# Patient Record
Sex: Male | Born: 1965 | Race: White | Hispanic: No | Smoking: Former smoker
Health system: Southern US, Community
[De-identification: ages and names within clinical notes are randomized; demographics above are authoritative.]

## PROBLEM LIST (undated history)

## (undated) DIAGNOSIS — I10 Essential (primary) hypertension: Secondary | ICD-10-CM

## (undated) DIAGNOSIS — G6181 Chronic inflammatory demyelinating polyneuritis: Secondary | ICD-10-CM

## (undated) DIAGNOSIS — E785 Hyperlipidemia, unspecified: Secondary | ICD-10-CM

## (undated) DIAGNOSIS — F909 Attention-deficit hyperactivity disorder, unspecified type: Secondary | ICD-10-CM

## (undated) DIAGNOSIS — K859 Acute pancreatitis without necrosis or infection, unspecified: Secondary | ICD-10-CM

## (undated) DIAGNOSIS — E119 Type 2 diabetes mellitus without complications: Secondary | ICD-10-CM

## (undated) DIAGNOSIS — F329 Major depressive disorder, single episode, unspecified: Secondary | ICD-10-CM

## (undated) DIAGNOSIS — F419 Anxiety disorder, unspecified: Secondary | ICD-10-CM

## (undated) DIAGNOSIS — B3781 Candidal esophagitis: Secondary | ICD-10-CM

## (undated) DIAGNOSIS — K5790 Diverticulosis of intestine, part unspecified, without perforation or abscess without bleeding: Secondary | ICD-10-CM

## (undated) DIAGNOSIS — N200 Calculus of kidney: Secondary | ICD-10-CM

## (undated) DIAGNOSIS — F32A Depression, unspecified: Secondary | ICD-10-CM

## (undated) DIAGNOSIS — K259 Gastric ulcer, unspecified as acute or chronic, without hemorrhage or perforation: Secondary | ICD-10-CM

## (undated) DIAGNOSIS — E559 Vitamin D deficiency, unspecified: Secondary | ICD-10-CM

## (undated) HISTORY — DX: Diverticulosis of intestine, part unspecified, without perforation or abscess without bleeding: K57.90

## (undated) HISTORY — PX: RETINAL DETACHMENT SURGERY: SHX105

## (undated) HISTORY — DX: Depression, unspecified: F32.A

## (undated) HISTORY — DX: Attention-deficit hyperactivity disorder, unspecified type: F90.9

## (undated) HISTORY — PX: CERVICAL DISC SURGERY: SHX588

## (undated) HISTORY — DX: Acute pancreatitis without necrosis or infection, unspecified: K85.90

## (undated) HISTORY — DX: Candidal esophagitis: B37.81

## (undated) HISTORY — DX: Major depressive disorder, single episode, unspecified: F32.9

## (undated) HISTORY — DX: Essential (primary) hypertension: I10

## (undated) HISTORY — PX: VASECTOMY: SHX75

## (undated) HISTORY — DX: Calculus of kidney: N20.0

## (undated) HISTORY — PX: RHINOPLASTY: SUR1284

## (undated) HISTORY — DX: Anxiety disorder, unspecified: F41.9

## (undated) HISTORY — DX: Hyperlipidemia, unspecified: E78.5

## (undated) HISTORY — PX: ELBOW SURGERY: SHX618

## (undated) HISTORY — DX: Gastric ulcer, unspecified as acute or chronic, without hemorrhage or perforation: K25.9

## (undated) HISTORY — DX: Type 2 diabetes mellitus without complications: E11.9

## (undated) HISTORY — PX: ANKLE SURGERY: SHX546

---

## 1999-12-14 ENCOUNTER — Emergency Department (HOSPITAL_COMMUNITY): Admission: EM | Admit: 1999-12-14 | Discharge: 1999-12-14 | Payer: Self-pay | Admitting: Emergency Medicine

## 2000-05-04 ENCOUNTER — Ambulatory Visit (HOSPITAL_COMMUNITY): Admission: RE | Admit: 2000-05-04 | Discharge: 2000-05-04 | Payer: Self-pay | Admitting: Urology

## 2000-05-04 ENCOUNTER — Encounter (INDEPENDENT_AMBULATORY_CARE_PROVIDER_SITE_OTHER): Payer: Self-pay | Admitting: Specialist

## 2005-05-15 ENCOUNTER — Ambulatory Visit (HOSPITAL_COMMUNITY): Admission: RE | Admit: 2005-05-15 | Discharge: 2005-05-15 | Payer: Self-pay | Admitting: Orthopedic Surgery

## 2005-05-15 ENCOUNTER — Ambulatory Visit (HOSPITAL_BASED_OUTPATIENT_CLINIC_OR_DEPARTMENT_OTHER): Admission: RE | Admit: 2005-05-15 | Discharge: 2005-05-15 | Payer: Self-pay | Admitting: Orthopedic Surgery

## 2006-07-06 ENCOUNTER — Encounter: Admission: RE | Admit: 2006-07-06 | Discharge: 2006-07-06 | Payer: Self-pay | Admitting: Orthopedic Surgery

## 2006-08-05 ENCOUNTER — Ambulatory Visit (HOSPITAL_COMMUNITY): Admission: RE | Admit: 2006-08-05 | Discharge: 2006-08-05 | Payer: Self-pay | Admitting: Neurosurgery

## 2006-10-01 ENCOUNTER — Encounter: Admission: RE | Admit: 2006-10-01 | Discharge: 2006-10-01 | Payer: Self-pay | Admitting: Neurosurgery

## 2008-07-08 ENCOUNTER — Emergency Department (HOSPITAL_COMMUNITY): Admission: EM | Admit: 2008-07-08 | Discharge: 2008-07-08 | Payer: Self-pay | Admitting: Family Medicine

## 2010-06-14 ENCOUNTER — Ambulatory Visit (HOSPITAL_COMMUNITY)
Admission: RE | Admit: 2010-06-14 | Discharge: 2010-06-15 | Payer: Self-pay | Source: Home / Self Care | Admitting: Ophthalmology

## 2010-06-17 LAB — BASIC METABOLIC PANEL
BUN: 12 mg/dL (ref 6–23)
CO2: 24 mEq/L (ref 19–32)
Calcium: 9.3 mg/dL (ref 8.4–10.5)
Chloride: 97 mEq/L (ref 96–112)
Creatinine, Ser: 0.75 mg/dL (ref 0.4–1.5)
GFR calc Af Amer: >60 mL/min (ref >60)
GFR calc non Af Amer: >60 mL/min (ref >60)
Glucose, Bld: 317 mg/dL — ABNORMAL HIGH (ref 70–99)
Potassium: 4.6 mEq/L (ref 3.5–5.1)
Sodium: 128 mEq/L — ABNORMAL LOW (ref 135–145)

## 2010-06-17 LAB — CBC
HCT: 40.6 % (ref 39.0–52.0)
Hemoglobin: 14.5 g/dL (ref 13.0–17.0)
MCH: 30.3 pg (ref 26.0–34.0)
MCHC: 35.7 g/dL (ref 30.0–36.0)
MCV: 84.8 fL (ref 78.0–100.0)
Platelets: 184 10*3/uL (ref 150–400)
RBC: 4.79 MIL/uL (ref 4.22–5.81)
RDW: 13.2 % (ref 11.5–15.5)
WBC: 6.5 10*3/uL (ref 4.0–10.5)

## 2010-06-17 LAB — GLUCOSE, CAPILLARY
Glucose-Capillary: 360 mg/dL — ABNORMAL HIGH (ref 70–99)
Glucose-Capillary: 280 mg/dL — ABNORMAL HIGH (ref 70–99)
Glucose-Capillary: 220 mg/dL — ABNORMAL HIGH (ref 70–99)
Glucose-Capillary: 224 mg/dL — ABNORMAL HIGH (ref 70–99)
Glucose-Capillary: 335 mg/dL — ABNORMAL HIGH (ref 70–99)
Glucose-Capillary: 283 mg/dL — ABNORMAL HIGH (ref 70–99)
Glucose-Capillary: 211 mg/dL — ABNORMAL HIGH (ref 70–99)

## 2010-06-17 LAB — SURGICAL PCR SCREEN
MRSA, PCR: NEGATIVE
Staphylococcus aureus: NEGATIVE

## 2010-09-17 LAB — POCT I-STAT, CHEM 8
BUN: 12 mg/dL (ref 6–23)
Calcium, Ion: 1.21 mmol/L (ref 1.12–1.32)
Chloride: 93 mEq/L — ABNORMAL LOW (ref 96–112)
Creatinine, Ser: 0.9 mg/dL (ref 0.4–1.5)
Glucose, Bld: 626 mg/dL (ref 70–99)
HCT: 44 % (ref 39.0–52.0)
Hemoglobin: 15 g/dL (ref 13.0–17.0)
Potassium: 4.6 mEq/L (ref 3.5–5.1)
Sodium: 127 mEq/L — ABNORMAL LOW (ref 135–145)
TCO2: 24 mmol/L (ref 0–100)

## 2010-09-17 LAB — GLUCOSE, CAPILLARY: Glucose-Capillary: >600 mg/dL (ref 70–99)

## 2010-10-15 ENCOUNTER — Emergency Department (HOSPITAL_COMMUNITY): Payer: PRIVATE HEALTH INSURANCE

## 2010-10-15 ENCOUNTER — Inpatient Hospital Stay (HOSPITAL_COMMUNITY)
Admission: EM | Admit: 2010-10-15 | Discharge: 2010-10-23 | DRG: 439 | Disposition: A | Discharge status: Home / Self Care | Payer: PRIVATE HEALTH INSURANCE | Attending: Endocrinology | Admitting: Endocrinology

## 2010-10-15 ENCOUNTER — Inpatient Hospital Stay (INDEPENDENT_AMBULATORY_CARE_PROVIDER_SITE_OTHER)
Admission: RE | Admit: 2010-10-15 | Discharge: 2010-10-15 | Disposition: A | Discharge status: Home / Self Care | Payer: PRIVATE HEALTH INSURANCE | Source: Ambulatory Visit | Attending: Family Medicine | Admitting: Family Medicine

## 2010-10-15 DIAGNOSIS — K859 Acute pancreatitis without necrosis or infection, unspecified: Principal | ICD-10-CM | POA: Diagnosis present

## 2010-10-15 DIAGNOSIS — E109 Type 1 diabetes mellitus without complications: Secondary | ICD-10-CM | POA: Diagnosis present

## 2010-10-15 DIAGNOSIS — R109 Unspecified abdominal pain: Secondary | ICD-10-CM

## 2010-10-15 DIAGNOSIS — J9819 Other pulmonary collapse: Secondary | ICD-10-CM | POA: Diagnosis present

## 2010-10-15 DIAGNOSIS — Z794 Long term (current) use of insulin: Secondary | ICD-10-CM

## 2010-10-15 DIAGNOSIS — E785 Hyperlipidemia, unspecified: Secondary | ICD-10-CM | POA: Diagnosis present

## 2010-10-15 DIAGNOSIS — K5730 Diverticulosis of large intestine without perforation or abscess without bleeding: Secondary | ICD-10-CM | POA: Diagnosis present

## 2010-10-15 LAB — CBC
HCT: 37.2 % — ABNORMAL LOW (ref 39.0–52.0)
Hemoglobin: 13.5 g/dL (ref 13.0–17.0)
MCH: 29.6 pg (ref 26.0–34.0)
MCHC: 36.3 g/dL — ABNORMAL HIGH (ref 30.0–36.0)
MCV: 81.6 fL (ref 78.0–100.0)
Platelets: 192 10*3/uL (ref 150–400)
RBC: 4.56 MIL/uL (ref 4.22–5.81)
RDW: 13.5 % (ref 11.5–15.5)
WBC: 10.3 10*3/uL (ref 4.0–10.5)

## 2010-10-15 LAB — POCT I-STAT, CHEM 8
BUN: 10 mg/dL (ref 6–23)
Calcium, Ion: 1.07 mmol/L — ABNORMAL LOW (ref 1.12–1.32)
Chloride: 102 mEq/L (ref 96–112)
Creatinine, Ser: 0.9 mg/dL (ref 0.4–1.5)
Glucose, Bld: 320 mg/dL — ABNORMAL HIGH (ref 70–99)
HCT: 45 % (ref 39.0–52.0)
Hemoglobin: 15.3 g/dL (ref 13.0–17.0)
Potassium: 4 mEq/L (ref 3.5–5.1)
Sodium: 128 mEq/L — ABNORMAL LOW (ref 135–145)
TCO2: 23 mmol/L (ref 0–100)

## 2010-10-15 LAB — DIFFERENTIAL
Basophils Absolute: 0 10*3/uL (ref 0.0–0.1)
Basophils Relative: 0 % (ref 0–1)
Eosinophils Absolute: 0.2 10*3/uL (ref 0.0–0.7)
Eosinophils Relative: 2 % (ref 0–5)
Lymphocytes Relative: 11 % — ABNORMAL LOW (ref 12–46)
Lymphs Abs: 1.1 10*3/uL (ref 0.7–4.0)
Monocytes Absolute: 0.5 10*3/uL (ref 0.1–1.0)
Monocytes Relative: 5 % (ref 3–12)
Neutro Abs: 8.6 10*3/uL — ABNORMAL HIGH (ref 1.7–7.7)
Neutrophils Relative %: 83 % — ABNORMAL HIGH (ref 43–77)

## 2010-10-15 LAB — COMPREHENSIVE METABOLIC PANEL
ALT: 26 U/L (ref 0–53)
AST: 27 U/L (ref 0–37)
Albumin: 3.7 g/dL (ref 3.5–5.2)
Alkaline Phosphatase: 158 U/L — ABNORMAL HIGH (ref 39–117)
BUN: 10 mg/dL (ref 6–23)
CO2: 21 mEq/L (ref 19–32)
Calcium: 9.6 mg/dL (ref 8.4–10.5)
Chloride: 95 mEq/L — ABNORMAL LOW (ref 96–112)
Creatinine, Ser: 0.78 mg/dL (ref 0.4–1.5)
GFR calc Af Amer: >60 mL/min (ref >60)
GFR calc non Af Amer: >60 mL/min (ref >60)
Glucose, Bld: 311 mg/dL — ABNORMAL HIGH (ref 70–99)
Potassium: 3.9 mEq/L (ref 3.5–5.1)
Sodium: 130 mEq/L — ABNORMAL LOW (ref 135–145)
Total Bilirubin: 0.7 mg/dL (ref 0.3–1.2)
Total Protein: 7.3 g/dL (ref 6.0–8.3)

## 2010-10-15 LAB — GLUCOSE, CAPILLARY
Glucose-Capillary: 311 mg/dL — ABNORMAL HIGH (ref 70–99)
Glucose-Capillary: 280 mg/dL — ABNORMAL HIGH (ref 70–99)

## 2010-10-15 LAB — LIPASE, BLOOD: Lipase: 542 U/L — ABNORMAL HIGH (ref 11–59)

## 2010-10-16 ENCOUNTER — Inpatient Hospital Stay (HOSPITAL_COMMUNITY): Payer: PRIVATE HEALTH INSURANCE

## 2010-10-16 LAB — BASIC METABOLIC PANEL
BUN: 8 mg/dL (ref 6–23)
GFR calc Af Amer: >60 mL/min (ref >60)
GFR calc non Af Amer: >60 mL/min (ref >60)
Potassium: 5.5 mEq/L — ABNORMAL HIGH (ref 3.5–5.1)
Sodium: 127 mEq/L — ABNORMAL LOW (ref 135–145)

## 2010-10-16 LAB — COMPREHENSIVE METABOLIC PANEL
Albumin: 3.2 g/dL — ABNORMAL LOW (ref 3.5–5.2)
Alkaline Phosphatase: 106 U/L (ref 39–117)
Chloride: 99 mEq/L (ref 96–112)
Potassium: 5.8 mEq/L — ABNORMAL HIGH (ref 3.5–5.1)
Sodium: 130 mEq/L — ABNORMAL LOW (ref 135–145)
Total Bilirubin: 1 mg/dL (ref 0.3–1.2)
Total Protein: 6.8 g/dL (ref 6.0–8.3)

## 2010-10-16 LAB — DIFFERENTIAL
Basophils Absolute: 0 10*3/uL (ref 0.0–0.1)
Basophils Relative: 0 % (ref 0–1)
Eosinophils Relative: 2 % (ref 0–5)
Monocytes Absolute: 0.7 10*3/uL (ref 0.1–1.0)
Monocytes Relative: 7 % (ref 3–12)
Neutro Abs: 7 10*3/uL (ref 1.7–7.7)

## 2010-10-16 LAB — GLUCOSE, CAPILLARY: Glucose-Capillary: 197 mg/dL — ABNORMAL HIGH (ref 70–99)

## 2010-10-16 LAB — CBC
HCT: 34.5 % — ABNORMAL LOW (ref 39.0–52.0)
Hemoglobin: 12.1 g/dL — ABNORMAL LOW (ref 13.0–17.0)
MCH: 29.7 pg (ref 26.0–34.0)
MCHC: 35.1 g/dL (ref 30.0–36.0)
RDW: 13.9 % (ref 11.5–15.5)

## 2010-10-16 LAB — TSH: TSH: 0.48 u[IU]/mL (ref 0.350–4.500)

## 2010-10-16 LAB — LIPID PANEL
Cholesterol: 918 mg/dL — ABNORMAL HIGH (ref 0–200)
LDL Cholesterol: UNDETERMINED mg/dL (ref 0–99)
VLDL: UNDETERMINED mg/dL (ref 0–40)

## 2010-10-16 LAB — HEMOGLOBIN A1C
Hgb A1c MFr Bld: 15 % — ABNORMAL HIGH (ref <5.7)
Mean Plasma Glucose: 384 mg/dL — ABNORMAL HIGH (ref <117)

## 2010-10-16 MED ORDER — IOHEXOL 300 MG/ML  SOLN
100.0000 mL | Freq: Once | INTRAMUSCULAR | Status: AC | PRN
  Administered 2010-10-16: 100 mL via INTRAVENOUS

## 2010-10-17 DIAGNOSIS — K859 Acute pancreatitis without necrosis or infection, unspecified: Secondary | ICD-10-CM

## 2010-10-17 LAB — LIPID PANEL
LDL Cholesterol: UNDETERMINED mg/dL (ref 0–99)
Total CHOL/HDL Ratio: 12.8 RATIO
VLDL: UNDETERMINED mg/dL (ref 0–40)

## 2010-10-17 LAB — GLUCOSE, CAPILLARY
Glucose-Capillary: 169 mg/dL — ABNORMAL HIGH (ref 70–99)
Glucose-Capillary: 264 mg/dL — ABNORMAL HIGH (ref 70–99)

## 2010-10-17 LAB — COMPREHENSIVE METABOLIC PANEL
Albumin: 2.7 g/dL — ABNORMAL LOW (ref 3.5–5.2)
Alkaline Phosphatase: 107 U/L (ref 39–117)
Glucose, Bld: 177 mg/dL — ABNORMAL HIGH (ref 70–99)
Potassium: 3.8 mEq/L (ref 3.5–5.1)
Sodium: 128 mEq/L — ABNORMAL LOW (ref 135–145)
Total Protein: 5.9 g/dL — ABNORMAL LOW (ref 6.0–8.3)

## 2010-10-17 LAB — CBC
HCT: 35.5 % — ABNORMAL LOW (ref 39.0–52.0)
RBC: 4.15 MIL/uL — ABNORMAL LOW (ref 4.22–5.81)
RDW: 13.7 % (ref 11.5–15.5)
WBC: 10.7 10*3/uL — ABNORMAL HIGH (ref 4.0–10.5)

## 2010-10-18 ENCOUNTER — Inpatient Hospital Stay (HOSPITAL_COMMUNITY): Payer: PRIVATE HEALTH INSURANCE

## 2010-10-18 LAB — CBC
Hemoglobin: 11.7 g/dL — ABNORMAL LOW (ref 13.0–17.0)
MCH: 29.6 pg (ref 26.0–34.0)
MCHC: 34.3 g/dL (ref 30.0–36.0)
Platelets: 199 10*3/uL (ref 150–400)
RBC: 3.95 MIL/uL — ABNORMAL LOW (ref 4.22–5.81)

## 2010-10-18 LAB — COMPREHENSIVE METABOLIC PANEL
ALT: 14 U/L (ref 0–53)
AST: 15 U/L (ref 0–37)
Alkaline Phosphatase: 104 U/L (ref 39–117)
CO2: 24 mEq/L (ref 19–32)
GFR calc Af Amer: >60 mL/min (ref >60)
GFR calc non Af Amer: >60 mL/min (ref >60)
Glucose, Bld: 150 mg/dL — ABNORMAL HIGH (ref 70–99)
Potassium: 3.9 mEq/L (ref 3.5–5.1)
Sodium: 131 mEq/L — ABNORMAL LOW (ref 135–145)

## 2010-10-18 LAB — GLUCOSE, CAPILLARY
Glucose-Capillary: 162 mg/dL — ABNORMAL HIGH (ref 70–99)
Glucose-Capillary: 171 mg/dL — ABNORMAL HIGH (ref 70–99)
Glucose-Capillary: 181 mg/dL — ABNORMAL HIGH (ref 70–99)

## 2010-10-18 LAB — LIPASE, BLOOD: Lipase: 42 U/L (ref 11–59)

## 2010-10-18 LAB — LIPID PANEL
HDL: 46 mg/dL (ref >39)
LDL Cholesterol: UNDETERMINED mg/dL (ref 0–99)
Total CHOL/HDL Ratio: 13.2 RATIO

## 2010-10-18 NOTE — Op Note (Signed)
[name], [name]             ACCOUNT NO.:  [id number]   MEDICAL RECORD NO.:  [id number]          PATIENT TYPE:  AMB   LOCATION:  SDS                          FACILITY:  [facility]   PHYSICIAN:  Henry A. Pool, M.D.    DATE OF BIRTH:  04-May-1966   DATE OF PROCEDURE:  08/05/2006  DATE OF DISCHARGE:                               OPERATIVE REPORT   PREOPERATIVE DIAGNOSIS:  Right C5-6 herniated nucleus pulposus with  radiculopathy.   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   PROCEDURE NOTE:  C5-6 anterior cervical diskectomy and fusion with  allograft anterior plating.   SURGEON:  Kathaleen Maser. Pool, M.D.   ASSISTANT:  Tia Alert, MD.   ANESTHESIA:  General orotracheal anesthesia.   INDICATIONS FOR PROCEDURE:  Mr. [name] is a 45 year old male with  history of neck and right extremity pain, paresthesias and weakness, a  C6 radiculopathy.  Workup demonstrates evidence of a significant right-  sided C5-6 disk herniation with compression of the right lateral aspect  of the spinal cord and exiting right-sided C6 nerve root.  We discussed  options of management including  the possibility of undergoing a C5-6  anterior cervical diskectomy and fusion with allograft anterior plating.  The patient decided to proceed with surgery in hopes of improving his  symptoms.   DESCRIPTION OF PROCEDURE:  The patient taken to the operating room and  placed on the operating room table in __________ position. After  adequate level of anesthesia was achieved, the patient was turned supine  with neck slightly extended and held in place with halter traction.  Anterior cervical regions prepped and draped sterilely.  A 10 blade was  used to make a  linear skin incision overlying the C5-6 interspace.  This was carried sharply to the platysma.  Platysma then divided  vertically and dissection completed along the medial border of  sternomastoid muscle and carotid  sheath.  Trachea and esophagus were  mobilized and retracted towards the left.  Prevertebral fascia  __________ the anterior spinal column.  Longus coli muscle then elevated  bilaterally using electrocautery.  Deep self-retaining retractor was  placed.  Intraoperative fluoroscopy was used, C5-6 level was confirmed.  Disk space then incised with 15 blade in a rectangular fashion.  A wide  disk space clean-out was then achieved using pituitary rongeurs, forward  and backbiting curettes, Kerrison rongeurs, high-speed drill.  All  elements of the disk were removed down to the level of the posterior  annulus.  Microscope was then brought into the field and used throughout  the remainder of the diskectomy.  Remaining aspects of the annulus and  osteophytes removed using the high-speed drill down to the level of the  posterior longitudinal ligament.  Posterior longitudinal ligament was  then elevated and resected in piecemeal fashion using Kerrison rongeurs.  Underlying thecal sac was identified.  Wide central decompression was  then performed by undercutting the bodies of C5 and C6.  Decompression  then proceeded at each neural foramen.  Wide decompressive foraminotomy  was then performed along the course of the  exiting C6 nerve roots  bilaterally.  During the decompression, a large amount of free disk  herniation off to the right-side of C5-6 was encountered and completely  resected.  At this point, a very thorough decompression had been  achieved.  There was no evidence of injury to the thecal sac or nerve  roots.  Wound was then irrigated with antibiotic solution.  Gelfoam  placed topically for hemostasis.  Wound was then irrigated.  A 7 mm  LifeNet allograft wedge was then packed into recessed approximately 1 mm  off the anterior cortical margin.  A 27 mL Atlantis anterior cervical  plate was then placed over the C5 and C6 levels.  This was then attached  under fluoroscopic guidance using 13  mm variable screws, two each at  both levels.  All four screws given final tightening and found to be  solid within bone.  Locking screws engaged at both levels.  Final images  revealed good position of bone grafts and hardware, proper operative  level with normal alignment of spine.  Wound was then irrigated with  antibiotic solution.  Gelfoam was placed topically for hemostasis and  found be good.  Microscope and curette system were removed.  Hemostasis  was then achieved using bipolar electrocautery.  Wound was then closed  in typical fashion.  Steri-Strips and sterile dressing were applied.  There were no apparent complications.  The patient tolerated the  procedure well and he returns to the recovery room postoperatively.           ______________________________  Kathaleen Maser Pool, M.D.     HAP/MEDQ  D:  08/05/2006  T:  08/05/2006  Job:  [id number]

## 2010-10-18 NOTE — H&P (Signed)
[name], [name]             ACCOUNT NO.:  [id number]  MEDICAL RECORD NO.:  [id number]           PATIENT TYPE:  LOCATION:  4507                           FACILITY:  PHYSICIAN:  [name]. Evlyn Kanner, M.D. DATE OF BIRTH:  [birth year]  DATE OF ADMISSION:  10/16/2010 DATE OF DISCHARGE:                             HISTORY & PHYSICAL   This is a 45 year old white male with history of type 1 diabetes and hyperlipidemia, presents for evaluation.  He was last in our office in May 2011, has had no follow-up since then.  Apparently, he felt well over the week and then Monday started developing some vague abdominal discomfort.  He had four loose stools and then had more intense pain that was in his mid abdomen, radiating to his back.  This progressed and got worse over the day.  He went to Urgent Care last night and then here to the ER, has now been diagnosed with pancreatitis.  This is first ever pancreatitis episode.  He denies any excess alcohol intake and specifically had no binge drinking or excess alcohol in the last 2 weeks.  He has had some fever yesterday, but otherwise had no chills or sweats.  He has only been taking his insulin as his only medication and had no follow-up with any other doctor.  He had no unusual travel or exposures.  His breathing was a bit short yesterday.  He has no chest pain.  His blood sugars were running as low as 100 and as high as 400. He has had no hypoglycemic episodes.  He has had no neurologic complaints.  He started no new medications.  His pain is better at the present after the Dilaudid.  PAST MEDICAL HISTORY:  Includes type 1 diabetes back in 2007, history of ADHD, history of hyperlipidemia.  He recently had a retinal detachment treatment.  He has had rhinoplasty, ankle surgery, elbow surgery, vasectomy, and a cervical disk repair.  MEDICATIONS:  He noted his medications when I last saw him had included Lovaza, Cymbalta, vitamin D and B12 as  well as ramipril and Vyvanse.  It appears that he has not been taking any of those recently.  He is also on fenofibrate in the past.  ALLERGIES:  None.  FAMILY HISTORY:  His father died at 23 of a stroke.  Mother died at 12 of breast cancer.  He is married with three children.  PHYSICAL EXAMINATION:  VITAL SIGNS:  On exam here, blood pressure was 127/84, pulse 92, respirations 20, temperature 97.5, O2 sat 97%. GENERAL:  We have a fairly well-appearing, white male, in no distress at the present time. HEENT:  Sclerae anicteric.  Face is symmetrical.  Mucous membranes are moist without any lesions. NECK:  Supple.  No thyromegaly.  No bruits are heard. LUNGS:  Clear without wheezes, rales, or rhonchi.  No accessory muscle use. HEART:  Slightly fast and regular with no murmur. ABDOMEN:  Mildly distended with hypoactive bowel sounds.  He is tender in the epigastrium.  No rebound is present. EXTREMITIES:  Reveals strong distal pulses.  Good distal perfusion.  No edema is present. NEUROLOGIC:  The  patient is awake, alert, mentating perfectly well. Speech is clear.  No tremors present. SKIN:  Reveals no petechiae or other changes, specifically there is no change around his umbilicus.  LAB DATA:  White count is 10,300, hemoglobin 13.5, platelets 192,000. Lipase is clearly elevated at 542.  His sodium is 130, potassium 3.9, chloride 95, CO2 of 21, BUN 10, creatinine 0.78, glucose of 311.  His alk phos is 158, AST is 27, ALT is 26, albumin is 3.7, total protein is 7.6, calcium is 9.6.  RADIOLOGY TESTING:  A CT of the abdomen and pelvis shows acute pancreatitis including the pancreatic head and uncinate process.  There was inflammatory changes secondary to involvement of the duodenum and porta hepatis, small amount of free fluid tracking from the pancreatic head to right paracolic gutter, no pancreatic mucosa is really complicating features.  There was sigmoid diverticulosis and  mild pulmonary atelectasis noted.  The spleen was unaffected.  In summary, we have 45 year old white male, presenting with pancreatitis to a fairly severe degree.  He does not meet any Ranson criteria for significant high morbidity however.  At the present time, he seems to done well in the first 12 hours of therapy.  We need to watch for third spacing of fluids.  We do need to look at his lipid status, which is a very likely cause of this given that he has not taking his medicines and his sugars had been out of control.  He has no liver inflammation, so I do not think this is a gallbladder or gallstone related issue.  We will of course need to figure out if there is any other precipitating cause of this.  At the present time, I see no reason to consult General Surgery or GI unless he worsens.  A repeat labs are being done as we speak here.  We need to get him back on track in a general way from basic medical care perspective.  No antibiotics are needed at the present time.          ______________________________ [name] Evlyn Kanner, M.D.     SAS/MEDQ  D:  10/16/2010  T:  10/16/2010  Job:  [id number]  Electronically Signed by Adrian Prince M.D. on 10/18/2010 10:44:40 AM

## 2010-10-18 NOTE — Op Note (Signed)
[name], [name]             ACCOUNT NO.:  [id number]   MEDICAL RECORD NO.:  [id number]          PATIENT TYPE:  AMB   LOCATION:  DSC                          FACILITY:  [facility]   PHYSICIAN:  Rodney A. Mortenson, M.D.DATE OF BIRTH:  Oct 09, 1965   DATE OF PROCEDURE:  05/15/2005  DATE OF DISCHARGE:                                 OPERATIVE REPORT   PREOPERATIVE DIAGNOSIS:  Multiple loose bodies, right elbow.   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   PROCEDURE:  Open arthrotomy, removal of three large loose bodies, right  elbow.   SURGEON:  Lenard Galloway. Chaney Malling, M.D.   ANESTHESIA:  General.   DESCRIPTION OF PROCEDURE:  The patient was placed on the operating table in  supine position with a pneumatic tourniquet about the right upper arm.  The  right upper extremity was prepped with DuraPrep and draped out in the usual  manner.  The arm was then wrapped out with an Esmarch and tourniquet  elevated.  An incision was made just posterior to the lateral epicondyle and  carried distally and was followed inferior to the radial head just staying  near the muscles of the anconeus.  Bleeders were Bovie coagulated.  Self-  retaining retractor was put in place.  The fascia was split longitudinally.  The elbow joint could be seen to be swollen and edematous.  An incision was  made through the capsule joint and a fair amount of fluid was removed from  the elbow.  An incision was started at the level of the lateral epicondyle  and carried distally slightly past the radial head and then carried  proximally so the posterior recess of the distal humerus could be  visualized.  Once this was accomplished, a large sucker was put in place.  Posteriorly, there were three large loose bodies in the posterior fossa and  these were removed individually.  A great deal of  time was spent looking  posteriorly and no other loose bodies could be seen.  The anterior aspect  was visualized very carefully.  Just inferior to the radial head, there was  a large area of articular cartilage loss off the ulna as it articulated with  part of the capitellum.  No loose bodies could be found anteriorly.  The  elbow was copiously irrigated with saline solution.  No other significant  abnormalities were seen.  The articular surface over the radial head and  capitellum were articulated and was really quite normal.  The capsule was  closed with interrupted 2-0 Vicryl, the fascia was closed with interrupted 2-  0 Vicryl, the subcutaneous tissue was closed with 2-0 Vicryl and stainless  steel staples were used to close the skin.  Sterile dressings were applied  and the patient returned to the recovery room in excellent condition.  Technically, this procedure went extremely well.  Complications none.  Drains none.  I was very pleased with the final surgical outcome.  ______________________________  Lenard Galloway Chaney Malling, M.D.     RAM/MEDQ  D:  05/15/2005  T:  05/16/2005  Job:  [id number]

## 2010-10-18 NOTE — Op Note (Signed)
[facility]  Patient:    [name], [name]                      MRN: [id number] Proc. Date: 05/04/00 Attending:  Vonzell Schlatter. Patsi Sears, M.D.                           Operative Report  PREOPERATIVE DIAGNOSES:  Elective sterilization.  POSTOPERATIVE DIAGNOSES:  Elective sterilization.  OPERATION PERFORMED:  Bilateral vasectomy.  ANESTHESIA:  General endotracheal.  PREPARATION:  After appropriate preanesthesia, the patient was brought to the operating room and placed on the operating table in the dorsal supine position where general endotracheal anesthesia was introduced. He remained in the supine position where the scrotum was shaven, prepped with Betadine solution and draped in the usual fashion.  DESCRIPTION OF PROCEDURE:  Bilaterally the vas was identified. On the left side, the vas was deeply imbedded within surrounding fatty tissue. Dissection was accomplished bilaterally, the vas was identified bilaterally and doubly clamped and incised and a portion excised with the electrosurgical unit. Each end was then ligated with 3-0 Vicryl suture, and the ends covered with interrupted 3-0 Vicryl suture. Bleeding was controlled with electrocoagulation. Marcaine was injected into the scrotum and into the vas proximal and distalward bilaterally. The skin was closed with interrupted 3-0 Vicryl suture. A sterile dressing was applied with collodion dressing, and the patient was given IV Toradol, awakened and taken to the recovery room in good condition. DD:  05/04/00 TD:  05/04/00 Job: [id number]

## 2010-10-19 LAB — LIPID PANEL
Cholesterol: 460 mg/dL — ABNORMAL HIGH (ref 0–200)
LDL Cholesterol: UNDETERMINED mg/dL (ref 0–99)

## 2010-10-19 LAB — COMPREHENSIVE METABOLIC PANEL
ALT: 11 U/L (ref 0–53)
Alkaline Phosphatase: 98 U/L (ref 39–117)
BUN: 7 mg/dL (ref 6–23)
CO2: 20 mEq/L (ref 19–32)
Chloride: 97 mEq/L (ref 96–112)
GFR calc non Af Amer: >60 mL/min (ref >60)
Glucose, Bld: 168 mg/dL — ABNORMAL HIGH (ref 70–99)
Potassium: 4.3 mEq/L (ref 3.5–5.1)
Sodium: 132 mEq/L — ABNORMAL LOW (ref 135–145)
Total Bilirubin: 0.7 mg/dL (ref 0.3–1.2)

## 2010-10-19 LAB — GLUCOSE, CAPILLARY
Glucose-Capillary: 156 mg/dL — ABNORMAL HIGH (ref 70–99)
Glucose-Capillary: 153 mg/dL — ABNORMAL HIGH (ref 70–99)
Glucose-Capillary: 210 mg/dL — ABNORMAL HIGH (ref 70–99)

## 2010-10-19 LAB — LIPASE, BLOOD: Lipase: 35 U/L (ref 11–59)

## 2010-10-19 LAB — CBC
Hemoglobin: 11 g/dL — ABNORMAL LOW (ref 13.0–17.0)
MCH: 28.7 pg (ref 26.0–34.0)
MCHC: 33.3 g/dL (ref 30.0–36.0)
Platelets: 208 10*3/uL (ref 150–400)

## 2010-10-20 DIAGNOSIS — K859 Acute pancreatitis without necrosis or infection, unspecified: Secondary | ICD-10-CM

## 2010-10-20 LAB — COMPREHENSIVE METABOLIC PANEL
ALT: 12 U/L (ref 0–53)
AST: 13 U/L (ref 0–37)
Alkaline Phosphatase: 112 U/L (ref 39–117)
CO2: 25 mEq/L (ref 19–32)
Chloride: 99 mEq/L (ref 96–112)
GFR calc non Af Amer: >60 mL/min (ref >60)
Glucose, Bld: 166 mg/dL — ABNORMAL HIGH (ref 70–99)
Potassium: 3.6 mEq/L (ref 3.5–5.1)
Sodium: 136 mEq/L (ref 135–145)
Total Bilirubin: 0.7 mg/dL (ref 0.3–1.2)

## 2010-10-20 LAB — GLUCOSE, CAPILLARY
Glucose-Capillary: 154 mg/dL — ABNORMAL HIGH (ref 70–99)
Glucose-Capillary: 194 mg/dL — ABNORMAL HIGH (ref 70–99)
Glucose-Capillary: 236 mg/dL — ABNORMAL HIGH (ref 70–99)

## 2010-10-20 LAB — CBC
MCH: 28.8 pg (ref 26.0–34.0)
MCV: 85.6 fL (ref 78.0–100.0)
Platelets: 244 10*3/uL (ref 150–400)
RDW: 13.6 % (ref 11.5–15.5)
WBC: 8.1 10*3/uL (ref 4.0–10.5)

## 2010-10-20 LAB — DIFFERENTIAL
Basophils Relative: 0 % (ref 0–1)
Eosinophils Absolute: 0.4 10*3/uL (ref 0.0–0.7)
Eosinophils Relative: 5 % (ref 0–5)
Lymphs Abs: 1.6 10*3/uL (ref 0.7–4.0)
Monocytes Relative: 10 % (ref 3–12)

## 2010-10-21 ENCOUNTER — Inpatient Hospital Stay (HOSPITAL_COMMUNITY): Payer: PRIVATE HEALTH INSURANCE

## 2010-10-21 ENCOUNTER — Encounter (HOSPITAL_COMMUNITY): Payer: Self-pay | Admitting: Radiology

## 2010-10-21 DIAGNOSIS — K859 Acute pancreatitis without necrosis or infection, unspecified: Secondary | ICD-10-CM

## 2010-10-21 DIAGNOSIS — R188 Other ascites: Secondary | ICD-10-CM

## 2010-10-21 LAB — CBC
HCT: 31.5 % — ABNORMAL LOW (ref 39.0–52.0)
MCV: 85.4 fL (ref 78.0–100.0)
Platelets: 234 10*3/uL (ref 150–400)
RBC: 3.69 MIL/uL — ABNORMAL LOW (ref 4.22–5.81)
RDW: 13.3 % (ref 11.5–15.5)
WBC: 8.1 10*3/uL (ref 4.0–10.5)

## 2010-10-21 LAB — COMPREHENSIVE METABOLIC PANEL
ALT: 11 U/L (ref 0–53)
Albumin: 2.4 g/dL — ABNORMAL LOW (ref 3.5–5.2)
Alkaline Phosphatase: 102 U/L (ref 39–117)
BUN: 6 mg/dL (ref 6–23)
Chloride: 99 mEq/L (ref 96–112)
Glucose, Bld: 256 mg/dL — ABNORMAL HIGH (ref 70–99)
Potassium: 3.3 mEq/L — ABNORMAL LOW (ref 3.5–5.1)
Sodium: 134 mEq/L — ABNORMAL LOW (ref 135–145)
Total Bilirubin: 0.5 mg/dL (ref 0.3–1.2)
Total Protein: 6.1 g/dL (ref 6.0–8.3)

## 2010-10-21 LAB — GLUCOSE, CAPILLARY
Glucose-Capillary: 197 mg/dL — ABNORMAL HIGH (ref 70–99)
Glucose-Capillary: 221 mg/dL — ABNORMAL HIGH (ref 70–99)

## 2010-10-21 LAB — DIFFERENTIAL
Basophils Absolute: 0 10*3/uL (ref 0.0–0.1)
Lymphocytes Relative: 17 % (ref 12–46)
Lymphs Abs: 1.3 10*3/uL (ref 0.7–4.0)
Neutrophils Relative %: 71 % (ref 43–77)

## 2010-10-21 MED ORDER — IOHEXOL 300 MG/ML  SOLN
100.0000 mL | Freq: Once | INTRAMUSCULAR | Status: AC | PRN
  Administered 2010-10-21: 100 mL via INTRAVENOUS

## 2010-10-22 ENCOUNTER — Encounter: Payer: Self-pay | Admitting: Internal Medicine

## 2010-10-22 DIAGNOSIS — IMO0002 Reserved for concepts with insufficient information to code with codable children: Secondary | ICD-10-CM | POA: Insufficient documentation

## 2010-10-22 DIAGNOSIS — E781 Pure hyperglyceridemia: Secondary | ICD-10-CM | POA: Insufficient documentation

## 2010-10-22 DIAGNOSIS — R188 Other ascites: Secondary | ICD-10-CM

## 2010-10-22 DIAGNOSIS — K859 Acute pancreatitis without necrosis or infection, unspecified: Secondary | ICD-10-CM

## 2010-10-22 DIAGNOSIS — Z8719 Personal history of other diseases of the digestive system: Secondary | ICD-10-CM | POA: Insufficient documentation

## 2010-10-22 DIAGNOSIS — E1165 Type 2 diabetes mellitus with hyperglycemia: Secondary | ICD-10-CM | POA: Insufficient documentation

## 2010-10-22 LAB — GLUCOSE, CAPILLARY
Glucose-Capillary: 249 mg/dL — ABNORMAL HIGH (ref 70–99)
Glucose-Capillary: 264 mg/dL — ABNORMAL HIGH (ref 70–99)

## 2010-10-22 LAB — CBC
Hemoglobin: 11.9 g/dL — ABNORMAL LOW (ref 13.0–17.0)
MCH: 28.9 pg (ref 26.0–34.0)
MCHC: 33.9 g/dL (ref 30.0–36.0)
Platelets: 295 10*3/uL (ref 150–400)
RDW: 13.2 % (ref 11.5–15.5)

## 2010-10-22 LAB — COMPREHENSIVE METABOLIC PANEL
AST: 19 U/L (ref 0–37)
CO2: 30 mEq/L (ref 19–32)
Calcium: 9.9 mg/dL (ref 8.4–10.5)
Creatinine, Ser: 0.93 mg/dL (ref 0.4–1.5)
GFR calc Af Amer: >60 mL/min (ref >60)
GFR calc non Af Amer: >60 mL/min (ref >60)
Sodium: 137 mEq/L (ref 135–145)
Total Protein: 7.1 g/dL (ref 6.0–8.3)

## 2010-10-22 LAB — DIFFERENTIAL
Basophils Absolute: 0.1 10*3/uL (ref 0.0–0.1)
Basophils Relative: 1 % (ref 0–1)
Eosinophils Absolute: 0.3 10*3/uL (ref 0.0–0.7)
Eosinophils Relative: 3 % (ref 0–5)
Monocytes Absolute: 0.7 10*3/uL (ref 0.1–1.0)
Monocytes Relative: 8 % (ref 3–12)

## 2010-10-22 LAB — LIPID PANEL
HDL: 26 mg/dL — ABNORMAL LOW (ref >39)
Total CHOL/HDL Ratio: 14.4 RATIO
Triglycerides: 585 mg/dL — ABNORMAL HIGH (ref <150)

## 2010-10-22 LAB — AMMONIA: Ammonia: 15 umol/L (ref 11–60)

## 2010-10-23 LAB — COMPREHENSIVE METABOLIC PANEL
Alkaline Phosphatase: 113 U/L (ref 39–117)
BUN: 10 mg/dL (ref 6–23)
Calcium: 10 mg/dL (ref 8.4–10.5)
Glucose, Bld: 234 mg/dL — ABNORMAL HIGH (ref 70–99)
Potassium: 4.6 mEq/L (ref 3.5–5.1)
Total Protein: 6.8 g/dL (ref 6.0–8.3)

## 2010-10-23 LAB — DIFFERENTIAL
Eosinophils Relative: 5 % (ref 0–5)
Lymphocytes Relative: 28 % (ref 12–46)
Lymphs Abs: 1.9 10*3/uL (ref 0.7–4.0)
Monocytes Absolute: 0.5 10*3/uL (ref 0.1–1.0)
Monocytes Relative: 8 % (ref 3–12)

## 2010-10-23 LAB — CBC
HCT: 33.9 % — ABNORMAL LOW (ref 39.0–52.0)
MCH: 28.9 pg (ref 26.0–34.0)
MCHC: 33.9 g/dL (ref 30.0–36.0)
MCV: 85.2 fL (ref 78.0–100.0)
RDW: 13.1 % (ref 11.5–15.5)

## 2010-10-23 LAB — GLUCOSE, CAPILLARY: Glucose-Capillary: 232 mg/dL — ABNORMAL HIGH (ref 70–99)

## 2010-10-24 LAB — CULTURE, BLOOD (ROUTINE X 2)
Culture  Setup Time: 201205181043
Culture: NO GROWTH

## 2010-10-25 NOTE — Discharge Summary (Signed)
[name], [name]             ACCOUNT NO.:  [id number]  MEDICAL RECORD NO.:  [id number]           PATIENT TYPE:  I  LOCATION:  4507                         FACILITY:  [facility]  PHYSICIAN:  [name]. Evlyn Kanner, M.D. DATE OF BIRTH:  November 27, 1965  DATE OF ADMISSION:  10/15/2010 DATE OF DISCHARGE:  10/23/2010                              DISCHARGE SUMMARY   DISCHARGE DIAGNOSES: 1. Severe pancreatitis, clinically improved. 2. Severe hyperlipidemia, clinically improving. 3. Poorly controlled type 1 diabetes with hemoglobin A1c of 15%. 4. Fluid collection without frank abscess in the right pericolic     gutter.  CONSULTATIONS:  Gastroenterology and General Surgery.  PROCEDURES:  CAT scan x2.  HISTORY OF PRESENT ILLNESS:  [name] is a 45 year old white male who had not been seen in my practice for about 15 months.  He has known type 1 diabetes and hyperlipidemia.  He has not been on any lipid agents.  He presented with abdominal pain and workup showed him to have fairly severe pancreatitis.  Lipids came back with triglycerides in excess of 4000 and this was felt to be the etiologic cause of this problem.  Also his diabetes is very poorly controlled with an A1c of 15%.  The patient has done moderately well overall.  He did not meet any of the Ranson criteria and has never had any hemodynamic instability. He had no significant third spacing or major electrolyte disorders while here.  His blood sugars have come down fairly well.  We advanced his diet, initially he got some pain, we had to back off and now he has been back on the diet and doing better.  His diabetes control is still suboptimal.  I am hoping with more activity and an increase in his doses, we can get this down further.  He has noted some residual pain in his back and in his right flank, this is improving.  He has had no fever in several days.  This morning's vital signs, blood pressure 119/76, pulse 77, temperature is  97.3, and O2 sats 96% on room air.  LABORATORY DATA:  His chemistries today, sodium 138, potassium 4.6, chloride 99, CO2 30, BUN 10, creatinine 0.96, glucose of 234, and estimated GFR is greater than 60.  His albumin is low at 2.8, calcium 10.0, AST is 28, ALT is 17, and total protein 6.8.  His white count this morning 6700, hemoglobin 11.5, platelets 294,000, and ammonia yesterday was 15.  His lipid profile yesterday showed total cholesterol 375, triglycerides 585, and HDL 26.  His last lipase was on Oct 19, 2010 and was down to normal range at 15.  Two blood cultures on Oct 18, 2010 were negative.  His dramatic lipid profile from Oct 16, 2010 showed a total cholesterol of 918 and triglycerides 4030.  TSH was normal at 0.490. Hemoglobin A1c was 15.0% with a mean plasma glucose of 384.  At presentation, his chemistries revealed a sodium of 128, potassium 4, chloride 102, CO2 was 23, BUN 10, creatinine 0.9, and glucose of 320. CMP later that day, sodium 130, potassium 3.9, chloride 95, BUN 10, creatinine 0.78, glucose of 311,  and alk phos was 158.  Other testing was normal.  First lipase was 542.  RADIOLOGY TESTING:  A CT of the abdomen with contrast on Oct 22, 2010 showed a slight increase in the moderate pancreatitis involving the head and uncinate process, ill-defined fluid tracking in the right anterior pararenal space with possibly developing fluid collection at the level of pelvic brim.  No gas or significant enhancement to suggest superinfection.  Mild hepatic steatosis was present.  An ultrasound of the abdomen was done on Oct 18, 2010 which was negative.  A CT was done on Oct 16, 2010 that showed acute pancreatitis and pancreatic head and uncinate process, inflammatory changes involved the duodenum and porta hepatis.  Small amount of fluid tracking from the pancreatic head of the right pericolic gutter.  No pancreatic necrosis or other complicating features.  There was also  sigmoid diverticulosis and mild pulmonary atelectasis.  In summary, we have a 45 year old white male presenting with a severe pancreatitis due to very traumatic hyperlipidemia and poorly-controlled diabetes.  The patient has done remarkably well.  Given all of these issues, he is now clinically improved and ready to go home.  We are going to continue him on Augmentin 875 twice a day for 5 more days.  He will be on a combination of fenofibrate 160 daily and omega-3 fatty acids 2 grams twice daily.  He will have Dilaudid 1 twice a day as needed.  His Lantus will be increased at discharge from the current of 38 up to 45 units and he will use a sliding-scale NovoLog pre-meals.  He will be back in the office in a week to 10 days to see our CDE dietitian, pharmacist and discuss keeping his diabetes under control plus we will be rechecking his lipids as an outpatient.  A repeat CAT scan will be done in a couple weeks to see if this fluid collection resolves or consolidates.          ______________________________ [name] Evlyn Kanner, M.D.     SAS/MEDQ  D:  10/23/2010  T:  10/23/2010  Job:  [id number]  Electronically Signed by Adrian Prince M.D. on 10/25/2010 03:38:46 PM

## 2010-10-29 NOTE — Consult Note (Signed)
[name], [name]             ACCOUNT NO.:  [id number]  MEDICAL RECORD NO.:  [id number]           PATIENT TYPE:  I  LOCATION:  4507                         FACILITY:  [facility]  PHYSICIAN:  Gabrielle Dare. [name], M.D.DATE OF BIRTH:  04-Dec-1965  DATE OF CONSULTATION:  10/22/2010 DATE OF DISCHARGE:                                CONSULTATION   REFERRING PHYSICIAN:  [name], MD,FACG  PRIMARY CARE PHYSICIAN:  [name]. [name], MD  REASON FOR CONSULTATION:  Acute pancreatitis onset eight days ago now with worsening pain and new CT findings of possible fluid collection.  BRIEF HISTORY:  The patient is a 45 year old male with adult-onset diabetes mellitus, currently insulin dependent.  He presented last week, Oct 15, 2010 with onset of abdominal pain.  He describes the initial pain as kind of being midepigastric, but diffuse kind of radiating out from the center. Pain became progressively worse.  He was admitted and found to have significant pancreatitis with a lipase of 542.  Dr. Russella Dar saw the patient and did initial GI workup where he was found to have significant dyslipidemia.  His total cholesterol was 914, triglyceride was 4030. LFTs showed a total bilirubin of 0.7 and alk phos of 158, SGOT of 27, and SGPT of 28.  He was made n.p.o., placed on IV antibiotics and IV fluids and has been making good recovery.  Up until about 36 hours ago, he was down to one pain pill.  He had been started back on diet.  He had some diarrhea prior to his admission with acute onset of discomfort and had not had bowel movement until p.o.'s were restarted, but he is back to normal.  Now, he is having a right lower quadrant, right side going towards his back discomfort, which is requiring additional pain and also coincides with the timing of his antibiotics change from IV Unasyn to Augmentin p.o.  CT was repeated today because of his increased symptoms. This shows a slight increase in moderate  pancreatitis involving the head and ill-defined fluid tracking into the right anterior pararenal space and possibly developed a fluid collection at the level of the pelvic grim.  There was no gas or significant enhancement to suggest superinfection.  There was mild hepatic steatosis.  We were asked to see the patient in consultation to help with his care.  PAST MEDICAL HISTORY: 1. He was found to have type 2 diabetes approximately 2 years ago.  He     was on oral agents, now is insulin dependent. 2. ADHD. 3. Dyslipidemia as listed by Dr. Evlyn Kanner, but the patient says he was     unaware of that. 4. He had a history of stomach ulcer in the remote past, as it was     diagnosed with barium swallow. 5. He has had generalized bilateral lower extremity neuropathy for 12     years.  PAST SURGICAL HISTORY: 1. He had a retinal detachment, January 2012, repaired at [facility]. 2. History of rhinoplasty after a basketball injury. 3. History of ankle and elbow surgery. 4. History of cervical disk, anterior approach repair,  he thinks C4-     C5. 5. Mastectomy.  FAMILY HISTORY:  Father died with a stroke.  Mother had history of breast cancer died from this.  One brother and one sister in good health.  SOCIAL HISTORY:  He is married.  He has 3 kids.  He is currently not working.  Tobacco none since college.  Alcohol, social.  Drugs, none.  REVIEW OF SYSTEMS:  FEVER:  None prior to last week during his hospitalization.  SKIN:  No changes in weight:  No changes he is aware of.  CEREBROVASCULAR:  No history of stroke, seizure, headaches, syncope, presyncope.  PULMONARY:  No orthopnea, PND, dyspnea on exertion because of his retinal attachment, he has been least active that he has ever been.  CARDIAC:  No history of chest pain.  GI:  Occasional GERD, diarrhea, prior to his hospitalization.  He started back on p.o. foods and bowel movements are normal again.  NEUROLOGIC:  He has a  ongoing neuropathy both lower extremities 12 years duration, etiology was never diagnosed.  MUSCULOSKELETAL:  No joint pains or arthritis.  PSYCH:  He has a history of being treated for both depression and anxiety.  MEDICATIONS:  Home meds per Dr. Rinaldo Cloud note include Lovaza, Cymbalta, vitamin B12, ramipril, Vyvanse, and fenofibrate.  Current meds include Augmentin, fenofibrate, sliding scale insulin, Lovaza, Protonix, and Florastor.  ALLERGIES:  None known.  PHYSICAL EXAM:  GENERAL:  This is a well-nourished and well-developed, white male, in no acute distress. VITAL SIGNS:  Temperature is 98.1, heart rate is 73, blood pressure is 123/78, respiratory rate is 18, satting 98% on room air. HEENT:  Head, Normocephalic.  Ears, nose, throat, and mouth are within normal limits. NECK:  Trachea is in the midline.  No bruits.  No JVD.  No thyromegaly. CHEST:  Clear to auscultation.  No rales, rhonchi, wheezing, nontender. CARDIAC:  Normal S1 and S2.  No murmurs or rubs were heard.  Pulses are +2 and equal in both upper and lower extremities. ABDOMEN:  Soft, normal bowel sounds.  He is slightly tender.  This is in the right upper quadrant area, gets to his right side and radiates to his back some.  It is not side, is specifically more tender than another.  No masses, hernia, or abscesses noted. GU/RECTAL:  Deferred.  Lymphadenopathy, no palpable axillary, femoral, or cervical lymphadenopathy noted. MUSCULOSKELETAL:  No joint changes noted. SKIN:  No changes. NEUROLOGIC:  No focal changes.  Cranial nerves II through XII are grossly intact. PSYCH:  Normal affect.  LABORATORY DATA:  Currently, total cholesterol is down to 375, triglycerides 585, ammonia level was 15.  Sodium is 137, potassium is 3.9, chloride is 97, CO2 is 30, BUN is 8, creatinine is 0.9, glucose 254, total bilirubin 0.4, alk phos 121, SGOT/SGPT are 19 and 16 respectively.  White count is 8.2, hemoglobin 11.9, hematocrit  35, platelets 295,000.  His last lipase, August 19, 2010, was 35.  Diagnostic CT, Oct 16, 2010, shows acute pancreatitis at the head, inflammatory changes of the duodenum and the porta hepatis.  CT, Oct 22, 2010, shows increasing pancreatitis in the head and uncinate process.  There is fluid in the right anterior pararenal space as noted above, nonspecific fluid collection and there is mild hepatic steatosis.  Abdominal ultrasound, Oct 18, 2010 was normal.  Gallbladder was normal.  There is no pericholecystic fluid, gallbladder wall was normal.  IVC, liver, common bile duct, and both kidneys appeared normal.  IMPRESSION:  1. Pancreatitis with elevated triglycerides, new fluid collection on     the right. 2. Adult-onset diabetes mellitus. 3. Dyslipidemia. 4. History of gastric or probably esophageal ulcers in the past. 5. Long-term bilateral lower extremity neuropathy. 6. History of retinal detachments, January 2012. 7. History of attention deficit hyperactivity disorder/anxiety/history     of depression.  PLAN:  Dr. [name] has seen the patient, examined him, and reviewed the films.  He would not recommend trying to draw the fluid collection at this time.  He reccomends resuming oral intake. Control his pain adequately, with oral or IV meds.   Continued to treat this medically and we will follow with you.     Eber Hong, P.A.   ______________________________ Gabrielle Dare. [name], M.D.    WDJ/MEDQ  D:  10/22/2010  T:  10/22/2010  Job:  [id number]  cc:   [name], MD,FACG [name]. Evlyn Kanner, M.D.  Electronically Signed by Sherrie George P.A. on 10/24/2010 11:11:48 AM Electronically Signed by Violeta Gelinas M.D. on 10/29/2010 01:16:33 PM

## 2010-11-05 ENCOUNTER — Ambulatory Visit (INDEPENDENT_AMBULATORY_CARE_PROVIDER_SITE_OTHER): Payer: PRIVATE HEALTH INSURANCE | Admitting: Gastroenterology

## 2010-11-05 ENCOUNTER — Encounter: Payer: Self-pay | Admitting: Gastroenterology

## 2010-11-05 VITALS — BP 108/78 | HR 88 | Ht 76.0 in | Wt 238.0 lb

## 2010-11-05 DIAGNOSIS — K859 Acute pancreatitis without necrosis or infection, unspecified: Secondary | ICD-10-CM

## 2010-11-05 NOTE — Progress Notes (Signed)
History of Present Illness: This is a 45 year old male seen in followup for acute pancreatitis. He was hospitalized from May 15 through May 23. I saw him during his hospitalization and have reviewed the discharge summary, imaging reports and blood work. He had acute pancreatitis with triglyceride level of 4030. He had steady but slow improvement in his pancreatitis which was complicated by persistent right upper quadrant pain and a fluid collection in the right paracolic gutter. He underwent CT scan imaging on May 16th and May 20th. Abdominal ultrasound did not reveal cholelithiasis or biliary dilatation. The last CT scan revealed a slight increase in moderate pancreatitis involving the head and uncinate process with ill-defined fluid tracking into the right anterior pararenal space and possible fluid collection at the level of the pelvic brim. Mild steatosis was noted as well. Since discharge he has had complete resolution of his abdominal pain. He is eating normally. He states his blood sugars are under better control. He was evaluated by Dr. Evlyn Kanner earlier today in his office and had blood work.   Past Medical History  Diagnosis Date  . Diabetes type 2, controlled   . ADHD (attention deficit hyperactivity disorder)   . Hyperlipidemia   . Stomach ulcer   . Pancreatitis   . Anxiety and depression   . Diverticulosis   . Anxiety   . Depression   . Kidney stones    Past Surgical History  Procedure Date  . Retinal detachment surgery   . Rhinoplasty   . Elbow surgery   . Ankle surgery   . Cervical disc surgery     C4-C5  . Vasectomy     reports that he has quit smoking. He does not have any smokeless tobacco history on file. He reports that he drinks alcohol. He reports that he does not use illicit drugs. family history includes Breast cancer in his mother and Stroke in his father.  There is no history of Colon cancer. No Known Allergies    Outpatient Encounter Prescriptions as of 11/05/2010    Medication Sig Dispense Refill  . ampicillin (PRINCIPEN) 250 MG capsule Take 250 mg by mouth 2 (two) times daily.        . DULoxetine (CYMBALTA) 60 MG capsule Take 60 mg by mouth daily.        . fenofibrate 160 MG tablet Take 160 mg by mouth daily.        . insulin aspart (NOVOLOG) 100 UNIT/ML injection Inject 15 Units into the skin 3 (three) times daily before meals.        . insulin glargine (LANTUS) 100 UNIT/ML injection Inject 45 Units into the skin at bedtime.        Marland Kitchen omega-3 acid ethyl esters (LOVAZA) 1 G capsule Take 1 g by mouth daily.         Physical Exam: General: Well developed , well nourished, no acute distress Head: Normocephalic and atraumatic Eyes:  sclerae anicteric, EOMI Ears: Normal auditory acuity Mouth: No deformity or lesions Lungs: Clear throughout to auscultation Heart: Regular rate and rhythm; no murmurs, rubs or bruits Abdomen: Soft, non tender and non distended. No masses, hepatosplenomegaly or hernias noted. Normal Bowel sounds Musculoskeletal: Symmetrical with no gross deformities  Pulses:  Normal pulses noted Extremities: No clubbing, cyanosis, edema or deformities noted Neurological: Alert oriented x 4, grossly nonfocal Psychological:  Alert and cooperative. Normal mood and affect  Assessment and Recommendations:  1. Acute pancreatitis-resolved. Fluid collection around the head of the pancreas and right  paracolic gutter without abscess or pseudocyst noted. Plan for a CT scan at 8 weeks from his last CT scan to document resolution of the abnormalities and to rule out a developing pseudocyst and other abnromalities. The etiology of his pancreatitis is hypertriglyceridemia. He understands the importance of long-term management and close followup with Dr. Evlyn Kanner to control his lipids. If he has a recurrent episode of pancreatitis, without a significantly elevated triglyceride level, he should undergo further evaluation for other causes of pancreatitis. He may  resume modest alcohol usage if he so chooses with no more than 2 alcoholic beverages a day.  2. Hypertriglyceridemia. Followup and management with Dr. Evlyn Kanner.  3. Diabetes mellitus. Followup and management with Dr. Evlyn Kanner.   4. Mild hepatic steatosis. This is likely related to diabetes mellitus and hypertriglyceridemia. Again further followup and management with Dr. Evlyn Kanner.

## 2010-11-05 NOTE — Patient Instructions (Addendum)
You have been scheduled for a CT scan of the abdomen. Separate instructions given.  Go get your labs drawn one week prior to having your CT scan. Please go directly to the basement for these labs.  The order is already in the system.  We will contact after we receive the results back from the scan. cc: Adrian Prince, MD

## 2010-12-06 ENCOUNTER — Ambulatory Visit (INDEPENDENT_AMBULATORY_CARE_PROVIDER_SITE_OTHER): Payer: PRIVATE HEALTH INSURANCE | Admitting: Family Medicine

## 2010-12-06 ENCOUNTER — Encounter: Payer: Self-pay | Admitting: Family Medicine

## 2010-12-06 DIAGNOSIS — M67919 Unspecified disorder of synovium and tendon, unspecified shoulder: Secondary | ICD-10-CM

## 2010-12-06 DIAGNOSIS — M75100 Unspecified rotator cuff tear or rupture of unspecified shoulder, not specified as traumatic: Secondary | ICD-10-CM

## 2010-12-06 DIAGNOSIS — M719 Bursopathy, unspecified: Secondary | ICD-10-CM

## 2010-12-06 DIAGNOSIS — H3320 Serous retinal detachment, unspecified eye: Secondary | ICD-10-CM

## 2010-12-06 NOTE — Progress Notes (Signed)
  Subjective:    Patient ID: [name], male    DOB: January 09, 1966, 45 y.o.   MRN: [id number]  HPI Right shoulder pain for the last 4-6 weeks. No inciting injury. Came on gradually and has gotten worse. 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 Also having pain at night in the deltoid area.  PERTINENT  PMH / PSH: A.c. joint sprain as a child. Had a retinal detachment that was nontraumatic with status post surgery in January. Diabetes mellitus. History of pancreatitis. Review of Systems Denies numbness or tingling in his right upper extremity or hand. No weakness of the right upper extremity.    Objective:   Physical Exam     GENERAL: Well-developed male no acute distress Shoulder without obvious defect. Full strength in all planes of the rotator cuff.:              Internal rotation of his left arm he can get his hand to T12, on the right L4. His painful. He has intact lift off test in the subscapularis.              External rotation painless and full range of motion              Supraspinatus testing full-strength but active range of motion elicits pain. Distally neurovasculalry intact Impingement signs are negative ULTRASOUND: Right shoulder ultrasound reveals intact rotator cuff muscles. There is a small amount of edema seen in the subacromial bursa and stranding around the supraspinatus muscle. There are also a few minor calcifications at the insertion of the supraspinatus on the humeral head. Dynamic testing is negative for impingement.  INJECTION: Patient was given informed consent, signed copy in the chart. Appropriate time out was taken. Area prepped and draped in usual sterile fashion. One cc of kenalog plus  full cc of lidocaine was injected into the right subacromial bursa using a(n) posterior approach. The patient tolerated the procedure well. There were no complications. Post procedure instructions  were given.    Assessment & Plan:  Subacromial bursitis and rotator cuff syndrome. After discussion today he went to proceed with corticosteroid injection and we did that. Place him on recheck of strengthening program. Notably he had laser surgery to his retina but that was 6 months ago so I think he is at low risk for any type of steroid injection. He is diabetic and has recently had pancreatitis so we discussed possible elevation of his blood sugars in the next 3-5 days and how to follow them. I haven't scheduled him for 4-6 weeks. He is resolved at that time he can cancel the appointment. I would see him in the interim with new issues.

## 2010-12-23 ENCOUNTER — Other Ambulatory Visit (INDEPENDENT_AMBULATORY_CARE_PROVIDER_SITE_OTHER): Payer: PRIVATE HEALTH INSURANCE

## 2010-12-23 DIAGNOSIS — K859 Acute pancreatitis without necrosis or infection, unspecified: Secondary | ICD-10-CM

## 2010-12-23 LAB — COMPREHENSIVE METABOLIC PANEL
ALT: 31 U/L (ref 0–53)
CO2: 29 mEq/L (ref 19–32)
GFR: 74.76 mL/min (ref >60.00)
Sodium: 129 mEq/L — ABNORMAL LOW (ref 135–145)
Total Bilirubin: 0.6 mg/dL (ref 0.3–1.2)
Total Protein: 7.5 g/dL (ref 6.0–8.3)

## 2010-12-24 ENCOUNTER — Ambulatory Visit (INDEPENDENT_AMBULATORY_CARE_PROVIDER_SITE_OTHER)
Admission: RE | Admit: 2010-12-24 | Discharge: 2010-12-24 | Disposition: A | Discharge status: Home / Self Care | Payer: PRIVATE HEALTH INSURANCE | Source: Ambulatory Visit | Attending: Gastroenterology | Admitting: Gastroenterology

## 2010-12-24 DIAGNOSIS — K859 Acute pancreatitis without necrosis or infection, unspecified: Secondary | ICD-10-CM

## 2010-12-24 MED ORDER — IOHEXOL 300 MG/ML  SOLN
80.0000 mL | Freq: Once | INTRAMUSCULAR | Status: AC | PRN
  Administered 2010-12-24: 80 mL via INTRAVENOUS

## 2011-01-03 ENCOUNTER — Ambulatory Visit: Payer: PRIVATE HEALTH INSURANCE | Admitting: Family Medicine

## 2011-02-14 ENCOUNTER — Ambulatory Visit: Payer: PRIVATE HEALTH INSURANCE | Admitting: Family Medicine

## 2011-03-24 ENCOUNTER — Ambulatory Visit (INDEPENDENT_AMBULATORY_CARE_PROVIDER_SITE_OTHER): Payer: PRIVATE HEALTH INSURANCE | Admitting: Family Medicine

## 2011-03-24 ENCOUNTER — Encounter: Payer: Self-pay | Admitting: Family Medicine

## 2011-03-24 VITALS — BP 137/90 | HR 74

## 2011-03-24 DIAGNOSIS — M7500 Adhesive capsulitis of unspecified shoulder: Secondary | ICD-10-CM

## 2011-03-24 DIAGNOSIS — M7501 Adhesive capsulitis of right shoulder: Secondary | ICD-10-CM | POA: Insufficient documentation

## 2011-03-24 NOTE — Patient Instructions (Signed)
You are developing frozen shoulder. We have given you an injection today and are setting you  up for physical therapy. I would like to see you back in 3-4 weeks. I anticipate we may repeat the injection at that time. Please call me in the interim with problems. It is great to see you!

## 2011-03-24 NOTE — Progress Notes (Signed)
  Subjective:    Patient ID: [name], male    DOB: 1966-01-15, 45 y.o.   MRN: [id number]  HPI  Followup right shoulder pain. At last office visit we started him on a home exercise program which she has been intermittently. We gave him an injection. The sharp shooting kind of pain that he initially came in with his decreased to almost 0. He is now having some problems with range of motion which he did not have at last office visit. He has been doing a lot of extra work including overhead work. No specific injury.  Review of Systems    denies fever, denies unusual weight change. Denies numbness or tingling in his right hand. Objective:   Physical Exam  Vital signs reviewed. GENERAL: Well developed, well nourished, no acute distress RIGHT shoulder: ER  30 degrees, IR 30 degrees/ forward flexion 120. Strength intact. Distally neurovascularly intact INJECTION: Patient was given informed consent, signed copy in the chart. Appropriate time out was taken. Area prepped and draped in usual sterile fashion. One cc of methylprednisolone plus  4 cc of lidocaine was injected into the right glenohumeral space using a(n) posterior approach. The patient tolerated the procedure well. There were no complications. Post procedure instructions were given.       Assessment & Plan:  Adhesive capsulitis. Plan humeral joint injection today. Set up physical therapy return to clinic 3-4 weeks. Anticipate serial glenohumeral joint injections and

## 2011-04-09 ENCOUNTER — Ambulatory Visit
Payer: PRIVATE HEALTH INSURANCE | Attending: Family Medicine | Admitting: Rehabilitative and Restorative Service Providers"

## 2011-04-14 ENCOUNTER — Ambulatory Visit: Payer: PRIVATE HEALTH INSURANCE | Admitting: Family Medicine

## 2011-05-12 ENCOUNTER — Ambulatory Visit: Payer: PRIVATE HEALTH INSURANCE | Admitting: Family Medicine

## 2011-05-13 ENCOUNTER — Ambulatory Visit: Payer: PRIVATE HEALTH INSURANCE | Attending: Family Medicine | Admitting: Physical Therapy

## 2011-05-13 DIAGNOSIS — M25619 Stiffness of unspecified shoulder, not elsewhere classified: Secondary | ICD-10-CM | POA: Insufficient documentation

## 2011-05-13 DIAGNOSIS — M25519 Pain in unspecified shoulder: Secondary | ICD-10-CM | POA: Insufficient documentation

## 2011-05-13 DIAGNOSIS — IMO0001 Reserved for inherently not codable concepts without codable children: Secondary | ICD-10-CM | POA: Insufficient documentation

## 2011-05-20 ENCOUNTER — Ambulatory Visit: Payer: PRIVATE HEALTH INSURANCE | Admitting: Rehabilitative and Restorative Service Providers"

## 2011-05-21 ENCOUNTER — Encounter: Payer: PRIVATE HEALTH INSURANCE | Admitting: Rehabilitative and Restorative Service Providers"

## 2011-05-29 ENCOUNTER — Ambulatory Visit: Payer: PRIVATE HEALTH INSURANCE | Admitting: Physical Therapy

## 2011-06-02 ENCOUNTER — Ambulatory Visit: Payer: PRIVATE HEALTH INSURANCE | Admitting: Physical Therapy

## 2011-06-05 ENCOUNTER — Ambulatory Visit: Payer: PRIVATE HEALTH INSURANCE | Attending: Family Medicine | Admitting: Physical Therapy

## 2011-06-05 DIAGNOSIS — M25619 Stiffness of unspecified shoulder, not elsewhere classified: Secondary | ICD-10-CM | POA: Insufficient documentation

## 2011-06-05 DIAGNOSIS — IMO0001 Reserved for inherently not codable concepts without codable children: Secondary | ICD-10-CM | POA: Insufficient documentation

## 2011-06-05 DIAGNOSIS — M25519 Pain in unspecified shoulder: Secondary | ICD-10-CM | POA: Insufficient documentation

## 2011-06-09 ENCOUNTER — Encounter: Payer: PRIVATE HEALTH INSURANCE | Admitting: Rehabilitative and Restorative Service Providers"

## 2011-06-10 ENCOUNTER — Encounter (HOSPITAL_COMMUNITY): Payer: Self-pay

## 2011-06-10 ENCOUNTER — Emergency Department (HOSPITAL_COMMUNITY): Payer: PRIVATE HEALTH INSURANCE

## 2011-06-10 ENCOUNTER — Encounter (HOSPITAL_COMMUNITY): Payer: Self-pay | Admitting: *Deleted

## 2011-06-10 ENCOUNTER — Emergency Department (INDEPENDENT_AMBULATORY_CARE_PROVIDER_SITE_OTHER)
Admission: EM | Admit: 2011-06-10 | Discharge: 2011-06-10 | Disposition: A | Discharge status: Nursing Home (Includes ICF) | Payer: PRIVATE HEALTH INSURANCE | Source: Home / Self Care | Attending: Emergency Medicine | Admitting: Emergency Medicine

## 2011-06-10 ENCOUNTER — Emergency Department (HOSPITAL_COMMUNITY)
Admission: EM | Admit: 2011-06-10 | Discharge: 2011-06-10 | Disposition: A | Discharge status: Home / Self Care | Payer: PRIVATE HEALTH INSURANCE | Attending: Emergency Medicine | Admitting: Emergency Medicine

## 2011-06-10 DIAGNOSIS — Z794 Long term (current) use of insulin: Secondary | ICD-10-CM | POA: Insufficient documentation

## 2011-06-10 DIAGNOSIS — R42 Dizziness and giddiness: Secondary | ICD-10-CM

## 2011-06-10 DIAGNOSIS — E785 Hyperlipidemia, unspecified: Secondary | ICD-10-CM | POA: Insufficient documentation

## 2011-06-10 DIAGNOSIS — F909 Attention-deficit hyperactivity disorder, unspecified type: Secondary | ICD-10-CM | POA: Insufficient documentation

## 2011-06-10 DIAGNOSIS — G319 Degenerative disease of nervous system, unspecified: Secondary | ICD-10-CM | POA: Insufficient documentation

## 2011-06-10 DIAGNOSIS — E119 Type 2 diabetes mellitus without complications: Secondary | ICD-10-CM | POA: Insufficient documentation

## 2011-06-10 DIAGNOSIS — R51 Headache: Secondary | ICD-10-CM | POA: Insufficient documentation

## 2011-06-10 LAB — CBC
HCT: 39.5 % (ref 39.0–52.0)
MCH: 30 pg (ref 26.0–34.0)
MCHC: 35.9 g/dL (ref 30.0–36.0)
MCV: 83.3 fL (ref 78.0–100.0)
RDW: 13 % (ref 11.5–15.5)

## 2011-06-10 LAB — PROTIME-INR: Prothrombin Time: 12.8 seconds (ref 11.6–15.2)

## 2011-06-10 LAB — DIFFERENTIAL
Basophils Absolute: 0 10*3/uL (ref 0.0–0.1)
Basophils Relative: 0 % (ref 0–1)
Eosinophils Absolute: 0.2 10*3/uL (ref 0.0–0.7)
Eosinophils Relative: 3 % (ref 0–5)
Monocytes Absolute: 0.4 10*3/uL (ref 0.1–1.0)

## 2011-06-10 LAB — BASIC METABOLIC PANEL
Calcium: 9.3 mg/dL (ref 8.4–10.5)
Creatinine, Ser: 1.01 mg/dL (ref 0.50–1.35)
GFR calc Af Amer: >90 mL/min (ref >90)

## 2011-06-10 MED ORDER — METOCLOPRAMIDE HCL 5 MG/ML IJ SOLN: 10.0000 mg | Freq: Once | INTRAMUSCULAR | Status: DC

## 2011-06-10 MED ORDER — DIPHENHYDRAMINE HCL 50 MG/ML IJ SOLN: 25.0000 mg | Freq: Once | INTRAMUSCULAR | Status: DC

## 2011-06-10 MED ORDER — SODIUM CHLORIDE 0.9 % IV BOLUS (SEPSIS)
1000.0000 mL | Freq: Once | INTRAVENOUS | Status: AC
  Administered 2011-06-10: 1000 mL via INTRAVENOUS

## 2011-06-10 NOTE — ED Provider Notes (Signed)
History     CSN: [id number]  Arrival date & time 06/10/11  1309   First MD Initiated Contact with Patient 06/10/11 1340      Chief Complaint  Patient presents with  . Headache    (Consider location/radiation/quality/duration/timing/severity/associated sxs/prior treatment) Patient is a 46 y.o. male presenting with headaches. The history is provided by the patient. No language interpreter was used.  Headache  This is a new problem. The current episode started yesterday. The problem occurs constantly. The problem has not changed since onset.Associated with: vertigo. The pain is located in the left unilateral region. The quality of the pain is described as dull and throbbing. The pain is moderate. The pain does not radiate. Pertinent negatives include no anorexia, no fever, no malaise/fatigue, no near-syncope, no palpitations, no shortness of breath, no nausea and no vomiting. He has tried nothing for the symptoms. The treatment provided no relief.    Past Medical History  Diagnosis Date  . Diabetes type 2, controlled   . ADHD (attention deficit hyperactivity disorder)   . Hyperlipidemia   . Stomach ulcer   . Pancreatitis   . Anxiety and depression   . Diverticulosis   . Anxiety   . Depression   . Kidney stones     Past Surgical History  Procedure Date  . Retinal detachment surgery   . Rhinoplasty   . Elbow surgery   . Ankle surgery   . Cervical disc surgery     C4-C5  . Vasectomy     Family History  Problem Relation Age of Onset  . Stroke Father   . Breast cancer Mother   . Colon cancer Neg Hx     History  Substance Use Topics  . Smoking status: Former Games developer  . Smokeless tobacco: Former Neurosurgeon  . Alcohol Use: No     Socially      Review of Systems  Constitutional: Negative for fever, malaise/fatigue, activity change, appetite change and fatigue.  HENT: Negative for congestion, sore throat, rhinorrhea, neck pain and neck stiffness.   Respiratory: Negative  for cough and shortness of breath.   Cardiovascular: Negative for chest pain, palpitations and near-syncope.  Gastrointestinal: Negative for nausea, vomiting, abdominal pain and anorexia.  Genitourinary: Negative for dysuria, urgency, frequency and flank pain.  Musculoskeletal: Negative for myalgias, back pain and arthralgias.  Skin: Positive for rash.  Neurological: Positive for headaches. Negative for dizziness, weakness, light-headedness and numbness.  All other systems reviewed and are negative.    Allergies  Review of patient's allergies indicates no known allergies.  Home Medications   Current Outpatient Rx  Name Route Sig Dispense Refill  . ARIPIPRAZOLE 2 MG PO TABS Oral Take 2 mg by mouth daily.      . DULOXETINE HCL 60 MG PO CPEP Oral Take 120 mg by mouth daily.     . FENOFIBRATE 160 MG PO TABS Oral Take 160 mg by mouth daily.     . INSULIN ASPART 100 UNIT/ML Leslie SOLN Subcutaneous Inject 15 Units into the skin 3 (three) times daily before meals.     . INSULIN GLARGINE 100 UNIT/ML [place] SOLN Subcutaneous Inject 70 Units into the skin at bedtime.     . OMEGA-3-ACID ETHYL ESTERS 1 G PO CAPS Oral Take 2 g by mouth 2 (two) times daily.       BP 140/86  Pulse 79  Temp(Src) 98.2 F (36.8 C) (Oral)  Resp 16  Ht 6\' 4"  (1.93 m)  Wt 240 lb (  108.863 kg)  BMI 29.21 kg/m2  SpO2 97%  Physical Exam  Nursing note and vitals reviewed. Constitutional: He is oriented to person, place, and time. He appears well-developed and well-nourished.  HENT:  Head: Normocephalic and atraumatic.  Mouth/Throat: Oropharynx is clear and moist.  Eyes: Conjunctivae are normal. Pupils are equal, round, and reactive to light.  Neck: Normal range of motion. Neck supple.  Cardiovascular: Normal rate, regular rhythm, normal heart sounds and intact distal pulses.  Exam reveals no gallop and no friction rub.   No murmur heard. Pulmonary/Chest: Effort normal and breath sounds normal. No respiratory distress.    Abdominal: Soft. Bowel sounds are normal. There is no tenderness.  Musculoskeletal: Normal range of motion. He exhibits no tenderness.  Neurological: He is alert and oriented to person, place, and time. No cranial nerve deficit.  Skin: Skin is warm and dry.       Red area to for head scalp junction. There is no abscess, induration.    ED Course  Procedures (including critical care time)  Labs Reviewed  BASIC METABOLIC PANEL - Abnormal; Notable for the following:    Sodium 133 (*)    Glucose, Bld 283 (*)    GFR calc non Af Amer 87 (*)    All other components within normal limits  CBC  DIFFERENTIAL  PROTIME-INR  APTT   No results found.   1. Vertigo   2. Headache       MDM  Likely benign positional vertigo however with the presence of a headache there is concern for an additional etiology. I'm concerned about the possibility of posterior circulation involvement. CT head is performed as well as laboratory studies. He received a headache cocktail. I will treat his vertigo as necessary. He'll be placed in the CDU for further evaluation.  Discussed with [facility]        Dayton Bailiff, MD 06/10/11 1513

## 2011-06-10 NOTE — ED Provider Notes (Signed)
2:55 PM Patient to come to CDU holding following head CT.  This is a shared visit with Dr Nelida Gores.  Patient with dizziness that woke him from sleep, headache, nausea, ataxic gait.  Plan is for labs, head CT, MRI brain if CT is normal, symptomatic treatment for HA and vertigo.  PCP is Dr Evlyn Kanner.    3:05 PM Discussed patient with Felicie Morn, NP, who assumes care of patient upon his arrival to CDU.    Rise Patience, [place] 06/10/11 1506

## 2011-06-10 NOTE — ED Notes (Signed)
EKG given to Felicie Morn, NP.

## 2011-06-10 NOTE — ED Provider Notes (Signed)
1550--Date: 06/10/2011  Rate:75  Rhythm: normal sinus rhythm  QRS Axis: normal  Intervals: normal  ST/T Wave abnormalities: nonspecific T wave changes  Conduction Disutrbances:none  Narrative Interpretation: NSR  Old EKG Reviewed: unchanged   5:11 PM MRI and CT results reviewed and discussed with patient.  No acute abnormalities noted.  Patient currently symptom free.  Patient to follow-up with his PCP.  Jimmye Norman, NP 06/10/11 [phone]

## 2011-06-10 NOTE — ED Notes (Signed)
Pt reports 2 days ago he noticed a red bump hear his frontal hairline that has gotten larger.  Also he c.o dizziness last night, headache ths  AM with some nausea, he denies vomiting

## 2011-06-10 NOTE — ED Notes (Signed)
Report called to [facility] in triage.   Pt to go to main ED per shuttle

## 2011-06-10 NOTE — ED Notes (Signed)
Lt. Side headache since yesterday, and then last night with dizziness /spins. Also has a red area on the top of his rt. Forehead. ?bite.  Pt. Has no neuro deficits noted

## 2011-06-10 NOTE — ED Provider Notes (Signed)
History     CSN: [id number]  Arrival date & time 06/10/11  1153   First MD Initiated Contact with Patient 06/10/11 1158      Chief Complaint  Patient presents with  . Headache    (Consider location/radiation/quality/duration/timing/severity/associated sxs/prior treatment) HPI Comments: Yesterday night was feeling dizzy" and having a Headache on my left side, felt my vision was "blurry", feeling "off" when i woke up in the morning felt my gait was off a bit" also noticed this redness on my forehead hairline" its not tender, not certain if has anything to do" Im also nauseous"  Patient is a 46 y.o. male presenting with headaches. The history is provided by the patient.  Headache The primary symptoms include headaches, dizziness, visual change and nausea. Primary symptoms do not include loss of consciousness, altered mental status, paresthesias, focal weakness, loss of sensation, memory loss, fever or vomiting. The symptoms began 2 days ago. The symptoms are unchanged. The neurological symptoms are focal.  The headache is associated with visual change. The headache is not associated with neck stiffness, paresthesias, weakness or loss of balance.  Dizziness also occurs with nausea. Dizziness does not occur with tinnitus, vomiting or weakness.  Additional symptoms include pain and vertigo. Additional symptoms do not include neck stiffness, weakness, loss of balance, taste disturbance, hearing loss or tinnitus.    Past Medical History  Diagnosis Date  . Diabetes type 2, controlled   . ADHD (attention deficit hyperactivity disorder)   . Hyperlipidemia   . Stomach ulcer   . Pancreatitis   . Anxiety and depression   . Diverticulosis   . Anxiety   . Depression   . Kidney stones     Past Surgical History  Procedure Date  . Retinal detachment surgery   . Rhinoplasty   . Elbow surgery   . Ankle surgery   . Cervical disc surgery     C4-C5  . Vasectomy     Family History  Problem  Relation Age of Onset  . Stroke Father   . Breast cancer Mother   . Colon cancer Neg Hx     History  Substance Use Topics  . Smoking status: Former Games developer  . Smokeless tobacco: Former Neurosurgeon  . Alcohol Use: No     Socially      Review of Systems  Constitutional: Negative for fever.  HENT: Negative for hearing loss, neck stiffness and tinnitus.   Eyes: Negative for redness.  Gastrointestinal: Positive for nausea. Negative for vomiting.  Neurological: Positive for dizziness, vertigo and headaches. Negative for focal weakness, loss of consciousness, weakness, paresthesias and loss of balance.  Psychiatric/Behavioral: Negative for memory loss and altered mental status.    Allergies  Review of patient's allergies indicates no known allergies.  Home Medications   Current Outpatient Rx  Name Route Sig Dispense Refill  . DULOXETINE HCL 60 MG PO CPEP Oral Take 120 mg by mouth daily.     . FENOFIBRATE 160 MG PO TABS Oral Take 160 mg by mouth daily.     . INSULIN ASPART 100 UNIT/ML [place] SOLN Subcutaneous Inject 15 Units into the skin 3 (three) times daily before meals.     . INSULIN GLARGINE 100 UNIT/ML [place] SOLN Subcutaneous Inject 70 Units into the skin at bedtime.     . OMEGA-3-ACID ETHYL ESTERS 1 G PO CAPS Oral Take 2 g by mouth 2 (two) times daily.     . ARIPIPRAZOLE 2 MG PO TABS Oral Take 2 mg  by mouth daily.        There were no vitals taken for this visit.  Physical Exam  Nursing note and vitals reviewed. Constitutional: He is oriented to person, place, and time. He appears well-developed and well-nourished.  HENT:  Head: Normocephalic.  Eyes: Conjunctivae and EOM are normal. Pupils are equal, round, and reactive to light. Right eye exhibits no discharge. Left eye exhibits no discharge. Left eye exhibits normal extraocular motion.  Neck: Neck supple.  Cardiovascular: Normal rate and regular rhythm.   Pulmonary/Chest: Effort normal.  Neurological: He is alert and oriented to  person, place, and time. No cranial nerve deficit or sensory deficit. He exhibits normal muscle tone. Coordination normal.  Skin: There is erythema.       ED Course  Procedures (including critical care time)  Labs Reviewed - No data to display No results found.   1. Dizziness, nonspecific   2. Headache       MDM  HA- X 2 DAYS WITH AT REST DIZZINESS- AND REPORTS VISUAL CHANGES AND NAUSEA- NO Hx of cluster or migraine HA's. Neurological symptoms < 48 hours.        Jimmie Molly, MD 06/10/11 1356

## 2011-06-12 ENCOUNTER — Ambulatory Visit: Payer: PRIVATE HEALTH INSURANCE | Admitting: Physical Therapy

## 2011-06-16 ENCOUNTER — Ambulatory Visit: Payer: PRIVATE HEALTH INSURANCE | Admitting: Rehabilitative and Restorative Service Providers"

## 2011-06-19 ENCOUNTER — Ambulatory Visit: Payer: PRIVATE HEALTH INSURANCE | Admitting: Rehabilitative and Restorative Service Providers"

## 2011-06-24 ENCOUNTER — Encounter: Payer: PRIVATE HEALTH INSURANCE | Admitting: Physical Therapy

## 2011-06-26 ENCOUNTER — Encounter: Payer: PRIVATE HEALTH INSURANCE | Admitting: Physical Therapy

## 2011-07-01 ENCOUNTER — Ambulatory Visit: Payer: PRIVATE HEALTH INSURANCE | Admitting: Physical Therapy

## 2011-07-03 ENCOUNTER — Ambulatory Visit: Payer: PRIVATE HEALTH INSURANCE | Admitting: Physical Therapy

## 2011-07-08 ENCOUNTER — Ambulatory Visit: Payer: PRIVATE HEALTH INSURANCE | Attending: Family Medicine | Admitting: Physical Therapy

## 2011-07-08 DIAGNOSIS — M25619 Stiffness of unspecified shoulder, not elsewhere classified: Secondary | ICD-10-CM | POA: Insufficient documentation

## 2011-07-08 DIAGNOSIS — IMO0001 Reserved for inherently not codable concepts without codable children: Secondary | ICD-10-CM | POA: Insufficient documentation

## 2011-07-08 DIAGNOSIS — M25519 Pain in unspecified shoulder: Secondary | ICD-10-CM | POA: Insufficient documentation

## 2011-07-10 ENCOUNTER — Ambulatory Visit: Payer: PRIVATE HEALTH INSURANCE | Admitting: Physical Therapy

## 2011-07-14 ENCOUNTER — Encounter: Payer: PRIVATE HEALTH INSURANCE | Admitting: Physical Therapy

## 2011-07-16 ENCOUNTER — Encounter: Payer: PRIVATE HEALTH INSURANCE | Admitting: Physical Therapy

## 2011-10-31 ENCOUNTER — Ambulatory Visit: Payer: PRIVATE HEALTH INSURANCE | Admitting: Family Medicine

## 2011-11-03 ENCOUNTER — Ambulatory Visit (INDEPENDENT_AMBULATORY_CARE_PROVIDER_SITE_OTHER): Payer: PRIVATE HEALTH INSURANCE | Admitting: Family Medicine

## 2011-11-03 VITALS — BP 122/82 | Ht 76.0 in | Wt 245.0 lb

## 2011-11-03 DIAGNOSIS — M6780 Other specified disorders of synovium and tendon, unspecified site: Secondary | ICD-10-CM

## 2011-11-03 DIAGNOSIS — M6790 Unspecified disorder of synovium and tendon, unspecified site: Secondary | ICD-10-CM

## 2011-11-03 DIAGNOSIS — S29011S Strain of muscle and tendon of front wall of thorax, sequela: Secondary | ICD-10-CM

## 2011-11-04 ENCOUNTER — Encounter: Payer: Self-pay | Admitting: Family Medicine

## 2011-11-04 NOTE — Progress Notes (Signed)
  Subjective:    Patient ID: [name], male    DOB: Sep 01, 1965, 46 y.o.   MRN: [id number]  HPI  Several weeks of rigt sided collar bone pain. Area feels a little swollen. No redness or warmth.  No trauma. Hurts with certain motions (reaching forward to grab something)  Did not complete PT for his frozen shoulder secondary to financial issues. Did get some improvement tho.  Review of Systems    Pertinent review of systems: negative for fever or unusual weight change.  Objective:   Physical Exam  Vital signs reviewed. GENERAL: Well developed, well nourished, no acute distress RIGHT SHOULDER: IR on right hand behind back to L1 c/w T12 on left. FROm in lateral abduction, lacks 10 degrees forward flexion. Strength 5/5.  CLAVICLE: right is without deformity. [place] joints non tender. R AC joint mildly tender to manipulation of clavicle, not ttp over [facility] joint directly.  ULTRASOUND:  Arthritic change R AC joint. Right clavicle is without defect, no cortical changes. Small amount of fluid in muscle attachment of pec major at mid portion of clavicle.      INJECTION: Patient was given informed consent, signed copy in the chart. Appropriate time out was taken. Area prepped and draped in usual sterile fashion. One half cc of methylprednisolone 40 mg/ml plus  one cc of 1% lidocaine without epinephrine was injected into the fascial origin of pec major on mid portion of clavicle on right using a(n) US guided approach. The patient tolerated the procedure well. There were no complications. Post procedure instructions were given.  Assessment & Plan:  Muscle strain with some edema at origin pec major at r clavicle. Possibly from abnormal shoulder function secondary to previous adhesive capsuulitis. We discussed conservative options (NSAIDS, time, heat and Ice). He wanted something more aggressive and we agreed totry small amount of CSI at fascial origin pec major.

## 2013-06-15 ENCOUNTER — Encounter: Payer: Self-pay | Admitting: Podiatrist

## 2013-06-15 ENCOUNTER — Ambulatory Visit (INDEPENDENT_AMBULATORY_CARE_PROVIDER_SITE_OTHER): Payer: BC Managed Care – PPO | Admitting: Podiatrist

## 2013-06-15 VITALS — BP 118/76 | HR 75 | Resp 16

## 2013-06-15 DIAGNOSIS — M216X9 Other acquired deformities of unspecified foot: Secondary | ICD-10-CM

## 2013-06-15 DIAGNOSIS — M7750 Other enthesopathy of unspecified foot: Secondary | ICD-10-CM

## 2013-06-15 DIAGNOSIS — G600 Hereditary motor and sensory neuropathy: Secondary | ICD-10-CM

## 2013-06-15 DIAGNOSIS — M778 Other enthesopathies, not elsewhere classified: Secondary | ICD-10-CM

## 2013-06-15 DIAGNOSIS — M779 Enthesopathy, unspecified: Secondary | ICD-10-CM

## 2013-06-15 NOTE — Patient Instructions (Signed)
Peripheral Neuropathy Peripheral neuropathy is a type of nerve damage. It affects nerves that carry signals between the spinal cord and other parts of the body. These are called peripheral nerves. With peripheral neuropathy, one nerve or a group of nerves may be damaged.  CAUSES  Many things can damage peripheral nerves. For some people with peripheral neuropathy, the cause is unknown. Some causes include:  Diabetes. This is the most common cause of peripheral neuropathy.  Injury to a nerve.  Pressure or stress on a nerve that lasts a long time.  Too little vitamin B. Alcoholism can lead to this.  Infections.  Autoimmune diseases, such as multiple sclerosis and systemic lupus erythematosus.  Inherited nerve diseases.  Some medicines, such as cancer drugs.  Toxic substances, such as lead and mercury.  Too little blood flowing to the legs.  Kidney disease.  Thyroid disease. SIGNS AND SYMPTOMS  Different people have different symptoms. The symptoms you have will depend on which of your nerves is damaged. Common symptoms include:  Loss of feeling (numbness) in the feet and hands.  Tingling in the feet and hands.  Pain that burns.  Very sensitive skin.  Weakness.  Not being able to move a part of the body (paralysis).  Muscle twitching.  Clumsiness or poor coordination.  Loss of balance.  Not being able to control your bladder.  Feeling dizzy.  Sexual problems. DIAGNOSIS  Peripheral neuropathy is a symptom, not a disease. Finding the cause of peripheral neuropathy can be hard. To figure that out, your health care provider will take a medical history and do a physical exam. A neurological exam will also be done. This involves checking things affected by your brain, spinal cord, and nerves (nervous system). For example, your health care provider will check your reflexes, how you move, and what you can feel.  Other types of tests may also be ordered, such as:  Blood  tests.  A test of the fluid in your spinal cord.  Imaging tests, such as CT scans or an MRI.  Electromyography (EMG). This test checks the nerves that control muscles.  Nerve conduction velocity tests. These tests check how fast messages pass through your nerves.  Nerve biopsy. A small piece of nerve is removed. It is then checked under a microscope. TREATMENT   Medicine is often used to treat peripheral neuropathy. Medicines may include:  Pain-relieving medicines. Prescription or over-the-counter medicine may be suggested.  Antiseizure medicine. This may be used for pain.  Antidepressants. These also may help ease pain from neuropathy.  Lidocaine. This is a numbing medicine. You might wear a patch or be given a shot.  Mexiletine. This medicine is typically used to help control irregular heart rhythms.  Surgery. Surgery may be needed to relieve pressure on a nerve or to destroy a nerve that is causing pain.  Physical therapy to help movement.  Assistive devices to help movement. HOME CARE INSTRUCTIONS   Only take over-the-counter or prescription medicines as directed by your health care provider. Follow the instructions carefully for any given medicines. Do not take any other medicines without first getting approval from your health care provider.  If you have diabetes, work closely with your health care provider to keep your blood sugar under control.  If you have numbness in your feet:  Check every day for signs of injury or infection. Watch for redness, warmth, and swelling.  Wear padded socks and comfortable shoes. These help protect your feet.  Do not do   things that put pressure on your damaged nerve.  Do not smoke. Smoking keeps blood from getting to damaged nerves.  Avoid or limit alcohol. Too much alcohol can cause a lack of B vitamins. These vitamins are needed for healthy nerves.  Develop a good support system. Coping with peripheral neuropathy can be  stressful. Talk to a mental health specialist or join a support group if you are struggling.  Follow up with your health care provider as directed. SEEK MEDICAL CARE IF:   You have new signs or symptoms of peripheral neuropathy.  You are struggling emotionally from dealing with peripheral neuropathy.  You have a fever. SEEK IMMEDIATE MEDICAL CARE IF:   You have an injury or infection that is not healing.  You feel very dizzy or begin vomiting.  You have chest pain.  You have trouble breathing. Document Released: 05/09/2002 Document Revised: 01/29/2011 Document Reviewed: 01/24/2013 ExitCare Patient Information 2014 [employer]. Diabetes and Foot Care Diabetes may cause you to have problems because of poor blood supply (circulation) to your feet and legs. This may cause the skin on your feet to become thinner, break easier, and heal more slowly. Your skin may become dry, and the skin may peel and crack. You may also have nerve damage in your legs and feet causing decreased feeling in them. You may not notice minor injuries to your feet that could lead to infections or more serious problems. Taking care of your feet is one of the most important things you can do for yourself.  HOME CARE INSTRUCTIONS  Wear shoes at all times, even in the house. Do not go barefoot. Bare feet are easily injured.  Check your feet daily for blisters, cuts, and redness. If you cannot see the bottom of your feet, use a mirror or ask someone for help.  Wash your feet with warm water (do not use hot water) and mild soap. Then pat your feet and the areas between your toes until they are completely dry. Do not soak your feet as this can dry your skin.  Apply a moisturizing lotion or petroleum jelly (that does not contain alcohol and is unscented) to the skin on your feet and to dry, brittle toenails. Do not apply lotion between your toes.  Trim your toenails straight across. Do not dig under them or around the  cuticle. File the edges of your nails with an emery board or nail file.  Do not cut corns or calluses or try to remove them with medicine.  Wear clean socks or stockings every day. Make sure they are not too tight. Do not wear knee-high stockings since they may decrease blood flow to your legs.  Wear shoes that fit properly and have enough cushioning. To break in new shoes, wear them for just a few hours a day. This prevents you from injuring your feet. Always look in your shoes before you put them on to be sure there are no objects inside.  Do not cross your legs. This may decrease the blood flow to your feet.  If you find a minor scrape, cut, or break in the skin on your feet, keep it and the skin around it clean and dry. These areas may be cleansed with mild soap and water. Do not cleanse the area with peroxide, alcohol, or iodine.  When you remove an adhesive bandage, be sure not to damage the skin around it.  If you have a wound, look at it several times a day to   make sure it is healing.  Do not use heating pads or hot water bottles. They may burn your skin. If you have lost feeling in your feet or legs, you may not know it is happening until it is too late.  Make sure your health care provider performs a complete foot exam at least annually or more often if you have foot problems. Report any cuts, sores, or bruises to your health care provider immediately. SEEK MEDICAL CARE IF:   You have an injury that is not healing.  You have cuts or breaks in the skin.  You have an ingrown nail.  You notice redness on your legs or feet.  You feel burning or tingling in your legs or feet.  You have pain or cramps in your legs and feet.  Your legs or feet are numb.  Your feet always feel cold. SEEK IMMEDIATE MEDICAL CARE IF:   There is increasing redness, swelling, or pain in or around a wound.  There is a red line that goes up your leg.  Pus is coming from a wound.  You develop a  fever or as directed by your health care provider.  You notice a bad smell coming from an ulcer or wound. Document Released: 05/16/2000 Document Revised: 01/19/2013 Document Reviewed: 10/26/2012 ExitCare Patient Information 2014 [employer].  

## 2013-06-15 NOTE — Progress Notes (Signed)
Subjective: [name] presents today for evaluation of bilateral feet. 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 He previously had orthotic inserts that were beneficial however he has moved and has misplaced the orthotics. He also has pain third interspace right foot at today's visit.  Objective: Neurovascular status is intact and unchanged from the previous visit with palpable symmetric pedal pulses and decreasing sensation bilaterally. He has significant high arches with plantarflexed forefeet and contraction of digits which are flexible. He has upside down champagne bottle appearance of foot and calf musculature. Multiple symptoms of Charcot-Marie-Tooth are present. Discomfort in the third interspace right consistent with neuroma type symptoms he is present.  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 Flex sport insert with a Plastizote top cover will be ordered for him. His neurologist started on on Lyrica at his visit with her today and I did agree that this medication would most likely be beneficial. I did discuss an injection for the neuroma but we're going to hold off and see how the inserts do with his foot prior to injection therapy. We will call the patient when the orthotics are ready for pick up.

## 2013-07-14 ENCOUNTER — Ambulatory Visit (INDEPENDENT_AMBULATORY_CARE_PROVIDER_SITE_OTHER): Payer: BC Managed Care – PPO | Admitting: Podiatrist

## 2013-07-14 ENCOUNTER — Encounter: Payer: Self-pay | Admitting: Podiatrist

## 2013-07-14 VITALS — BP 102/70 | HR 90 | Resp 12

## 2013-07-14 DIAGNOSIS — M7750 Other enthesopathy of unspecified foot: Secondary | ICD-10-CM

## 2013-07-14 DIAGNOSIS — M216X9 Other acquired deformities of unspecified foot: Secondary | ICD-10-CM

## 2013-07-14 DIAGNOSIS — M778 Other enthesopathies, not elsewhere classified: Secondary | ICD-10-CM

## 2013-07-14 DIAGNOSIS — G600 Hereditary motor and sensory neuropathy: Secondary | ICD-10-CM

## 2013-07-14 DIAGNOSIS — M779 Enthesopathy, unspecified: Secondary | ICD-10-CM

## 2013-07-14 NOTE — Progress Notes (Signed)
DISPENSED ORTHOTICS AND GIVEN INSTRUCTION.

## 2013-07-14 NOTE — Patient Instructions (Signed)

## 2013-08-12 ENCOUNTER — Ambulatory Visit: Payer: BC Managed Care – PPO | Admitting: Podiatrist

## 2014-02-08 ENCOUNTER — Encounter: Payer: Self-pay | Admitting: Gastroenterology

## 2014-04-17 ENCOUNTER — Other Ambulatory Visit: Payer: Self-pay | Admitting: Endocrinology

## 2014-06-21 DIAGNOSIS — Z794 Long term (current) use of insulin: Secondary | ICD-10-CM

## 2014-06-21 DIAGNOSIS — E119 Type 2 diabetes mellitus without complications: Secondary | ICD-10-CM | POA: Insufficient documentation

## 2014-06-21 DIAGNOSIS — N521 Erectile dysfunction due to diseases classified elsewhere: Secondary | ICD-10-CM

## 2014-06-21 DIAGNOSIS — E1169 Type 2 diabetes mellitus with other specified complication: Secondary | ICD-10-CM | POA: Insufficient documentation

## 2014-06-21 DIAGNOSIS — G600 Hereditary motor and sensory neuropathy: Secondary | ICD-10-CM | POA: Insufficient documentation

## 2014-06-21 DIAGNOSIS — I10 Essential (primary) hypertension: Secondary | ICD-10-CM | POA: Insufficient documentation

## 2014-06-21 DIAGNOSIS — E1142 Type 2 diabetes mellitus with diabetic polyneuropathy: Secondary | ICD-10-CM | POA: Insufficient documentation

## 2014-06-21 HISTORY — DX: Essential (primary) hypertension: I10

## 2014-09-04 ENCOUNTER — Encounter: Payer: Self-pay | Admitting: Gastroenterology

## 2014-09-08 ENCOUNTER — Encounter: Payer: Self-pay | Admitting: Podiatrist

## 2014-09-08 ENCOUNTER — Ambulatory Visit (INDEPENDENT_AMBULATORY_CARE_PROVIDER_SITE_OTHER): Payer: 59 | Admitting: Podiatrist

## 2014-09-08 ENCOUNTER — Ambulatory Visit (INDEPENDENT_AMBULATORY_CARE_PROVIDER_SITE_OTHER): Payer: 59

## 2014-09-08 VITALS — BP 123/89 | HR 79 | Resp 12

## 2014-09-08 DIAGNOSIS — M79672 Pain in left foot: Secondary | ICD-10-CM

## 2014-09-08 DIAGNOSIS — M79671 Pain in right foot: Secondary | ICD-10-CM

## 2014-09-08 DIAGNOSIS — G600 Hereditary motor and sensory neuropathy: Secondary | ICD-10-CM | POA: Diagnosis not present

## 2014-09-08 DIAGNOSIS — R52 Pain, unspecified: Secondary | ICD-10-CM | POA: Diagnosis not present

## 2014-09-08 NOTE — Progress Notes (Signed)
   Subjective:    Patient ID: [name], male    DOB: 01/02/1966, 49 y.o.   MRN: [id number]  HPI  B/L BACK OF THE HEEL ARE PAINFUL FOR 7 MONTHS. FEET ARE GETTING WORSE, ESPECIALLY FIRST STEP IN THE MORNING. TRIED TAKING RX. LYRICA AND IT HELP.  Review of Systems  Musculoskeletal: Positive for gait problem.  All other systems reviewed and are negative.      Objective:   Physical Exam  neurovascular status intact- pedal pulses palpable and symmetric.  High arched foot type with prominent metatarsal heads and contracture of lesser digits noted.  Calcaneal varus is noted bilateral consistent with charcot marie tooth changes.  Pain on the posterior central aspect of the calcaneus is present bilateral.  Slight scarring present from previous blister formation is present posterior central aspect of heels. Digital contractures are present but do appear to be more flexible after he has been doing yoga regularly.      Assessment & Plan:  Progressive charcot marie tooth  Plan:  Recommended topical antiiflammatory medication through [facility] as well as an AFO/ bracing for the foot and ankle.  rx for [facility] was written for him to obtain the braces.

## 2014-09-14 ENCOUNTER — Telehealth: Payer: Self-pay | Admitting: *Deleted

## 2014-09-14 DIAGNOSIS — E559 Vitamin D deficiency, unspecified: Secondary | ICD-10-CM | POA: Insufficient documentation

## 2014-09-14 NOTE — Telephone Encounter (Signed)
I left patient a message to call me back.  Will give him the option of having AFO Brace done by us or by [facility] if he hasn't done so thus far.  Next date for doing AFO braces here will be 10/11/2014.

## 2014-09-14 NOTE — Telephone Encounter (Signed)
-----   Message from Delories HeinzKathryn P Egerton, DPM sent at 09/08/2014 10:14 PM EDT ----- Regarding: prosthetic lady? This patient needs an AFO device for his foot deformity-  I wrote him an RX for [facility] but Dr. Ardelle AntonWagoner mentioned the lady will be here next week that does these-- so I thought maybe you could call him and offer him to see her for the casting and the making of the orthotic device.   She probably has more experience with the podiatry specific devices so it may be worth it for him to have her do it versus [facility].    If you speak with him, just let him know that I didn't realize she was starting next week or I would have mentioned it at the visit.  Thanks!

## 2014-10-05 DIAGNOSIS — Z9119 Patient's noncompliance with other medical treatment and regimen: Secondary | ICD-10-CM | POA: Insufficient documentation

## 2014-10-05 DIAGNOSIS — F32A Depression, unspecified: Secondary | ICD-10-CM | POA: Insufficient documentation

## 2014-10-05 DIAGNOSIS — F329 Major depressive disorder, single episode, unspecified: Secondary | ICD-10-CM | POA: Insufficient documentation

## 2014-10-05 DIAGNOSIS — Z91199 Patient's noncompliance with other medical treatment and regimen due to unspecified reason: Secondary | ICD-10-CM | POA: Insufficient documentation

## 2014-10-11 ENCOUNTER — Telehealth: Payer: Self-pay | Admitting: Podiatrist

## 2014-10-13 NOTE — Telephone Encounter (Signed)
i'm sorry, i don't understand if there is a question and who the question is from?  Is it [facility]? Or is it a patient ?Marland Kitchen.Marland Kitchen. And what is an original lay?

## 2014-10-13 NOTE — Telephone Encounter (Signed)
"  Calling regarding a prescription.  We suggested a CMFO with lateral Wedge.  It was sent back with an original lay.  Please give me a call back."

## 2015-01-26 ENCOUNTER — Ambulatory Visit: Payer: 59 | Admitting: Podiatry

## 2015-06-14 ENCOUNTER — Ambulatory Visit (INDEPENDENT_AMBULATORY_CARE_PROVIDER_SITE_OTHER): Payer: BLUE CROSS/BLUE SHIELD

## 2015-06-14 ENCOUNTER — Encounter: Payer: Self-pay | Admitting: Podiatry

## 2015-06-14 ENCOUNTER — Ambulatory Visit (INDEPENDENT_AMBULATORY_CARE_PROVIDER_SITE_OTHER): Payer: BLUE CROSS/BLUE SHIELD | Admitting: Podiatry

## 2015-06-14 DIAGNOSIS — R52 Pain, unspecified: Secondary | ICD-10-CM

## 2015-06-14 DIAGNOSIS — M216X9 Other acquired deformities of unspecified foot: Secondary | ICD-10-CM | POA: Diagnosis not present

## 2015-06-14 DIAGNOSIS — L89891 Pressure ulcer of other site, stage 1: Secondary | ICD-10-CM

## 2015-06-14 DIAGNOSIS — M1812 Unilateral primary osteoarthritis of first carpometacarpal joint, left hand: Secondary | ICD-10-CM | POA: Insufficient documentation

## 2015-06-14 DIAGNOSIS — M654 Radial styloid tenosynovitis [de Quervain]: Secondary | ICD-10-CM | POA: Insufficient documentation

## 2015-06-14 DIAGNOSIS — G600 Hereditary motor and sensory neuropathy: Secondary | ICD-10-CM

## 2015-06-14 DIAGNOSIS — L97511 Non-pressure chronic ulcer of other part of right foot limited to breakdown of skin: Secondary | ICD-10-CM | POA: Diagnosis not present

## 2015-06-14 DIAGNOSIS — L97521 Non-pressure chronic ulcer of other part of left foot limited to breakdown of skin: Secondary | ICD-10-CM

## 2015-06-14 MED ORDER — CEPHALEXIN 500 MG PO CAPS: 500.0000 mg | ORAL_CAPSULE | Freq: Three times a day (TID) | ORAL | Status: DC

## 2015-06-14 NOTE — Patient Instructions (Signed)
Monitor for any signs/symptoms of infection. Call the office immediately if any occur or go directly to the emergency room. Call with any questions/concerns.  

## 2015-06-17 NOTE — Progress Notes (Signed)
Patient ID: [name], male   DOB: [birth year], 50 y.o.   MRN: [id number]  Subjective: 50 year old male presents the office with concerns of wounds to both his feet. He states on the right foot he did step on a screw around Thanksgiving and the wound is not yet healed. He states that there is a screw sticking of the forefoot which she stepped on the screw did not break off he does not feel it was left inside of his skin. He does have the left side about 1 week ago he developed a blood blister to his big toe joint on the bottom resulting in a wound. Denies any drainage or possibly denies any redness or red streaks. No malodor. He continues to her regular shoes. He does not wear brace. No other complaints.  Objective: AAO 3, NAD DP/PT pulses palpable 2/4, CRT less than 3 seconds The sensation decreased with Simms Weinstein monofilament There is  An increase in medial arch height with cavus foot type and plantarflexed first ray bilaterally. on the left foot submetatarsal one is a ulceration with surrounding epidermal lysis likely from prior bulla. The wound measures approximate 1 x 0.5 cm a superficial granular wound base after debridement. There is no undermining, probing, tunneling. There is no swelling erythema, ascending cellulitis. There is small  Amount ofserous drainage. No malodor. Right foot submetatarsal 4 annular ulceration with a small amount of hyperkeratotic overlying the area. After debridement the wound measures 0.5 x 0.5 cm without any probing, undermining or tunneling. No swelling erythema, ascending cellulitis, fluctuance, crepitus, malodor, drainage or purulence. No other open lesions or pre-ulcer lesions identified bilaterally. There is no pain with calf compression, swelling, warmth, erythema.  Assessment: 50 year old male bilateral ulcerations  Plan: -Treatment options discussed including all alternatives, risks, and complications -Etiology of symptoms were  discussed -X-rays were obtained and reviewed with the patient.  No evidence of foreign body on the right foot. No definitive evidence of acute fracture or stress fracture. No osteomyelitis at this time. -Etiology of symptoms were discussed -Wounds were both sharply debrided without complications to healthy granular wound base 2. -We'll start Keflex in case of infection starting given open wounds for prolonged time. -Surgical shoe on the left side with offloading pads dispensed. Offloading pads dispensed the right side. -Monitor for any clinical signs or symptoms of infection and directed to call the office immediately should any occur or go to the ER. -Long-term he needs custom braces and/or shoes help offload his feet given diabetes with CMT -Follow-up as scheduled or sooner if any problems arise. In the meantime, encouraged to call the office with any questions, concerns, change in symptoms.   [name], DPM

## 2015-07-03 ENCOUNTER — Ambulatory Visit (INDEPENDENT_AMBULATORY_CARE_PROVIDER_SITE_OTHER): Payer: BLUE CROSS/BLUE SHIELD | Admitting: Podiatry

## 2015-07-03 ENCOUNTER — Encounter: Payer: Self-pay | Admitting: Podiatry

## 2015-07-03 ENCOUNTER — Ambulatory Visit (INDEPENDENT_AMBULATORY_CARE_PROVIDER_SITE_OTHER): Payer: BLUE CROSS/BLUE SHIELD

## 2015-07-03 VITALS — BP 146/87 | HR 81 | Resp 16

## 2015-07-03 DIAGNOSIS — Z5189 Encounter for other specified aftercare: Secondary | ICD-10-CM

## 2015-07-03 DIAGNOSIS — L89891 Pressure ulcer of other site, stage 1: Secondary | ICD-10-CM | POA: Diagnosis not present

## 2015-07-03 DIAGNOSIS — L97521 Non-pressure chronic ulcer of other part of left foot limited to breakdown of skin: Secondary | ICD-10-CM

## 2015-07-03 DIAGNOSIS — L97511 Non-pressure chronic ulcer of other part of right foot limited to breakdown of skin: Secondary | ICD-10-CM

## 2015-07-05 ENCOUNTER — Encounter: Payer: Self-pay | Admitting: Podiatry

## 2015-07-05 NOTE — Progress Notes (Signed)
Patient ID: [name], male   DOB: 06-25-1965, 50 y.o.   MRN: [id number]  Subjective: 50 year old male presents the office today for follow-up evaluation of wounds to both of his feet. He states the left side is doing much better and the right side is healing although slowly. Denies any drainage or pus. Denies any redness or drainage. He was a surgical shoe for about 1 week and then discontinue wearing it. He felt the wound was healing without it. No other complaints at this time. Denies any systemic complaints such as fevers, chills, nausea, vomiting. No calf pain, chest pain, shortness of breath.  Objective: AAO 3, NAD DP/PT pulses palpable 2/4, CRT less than 3 seconds Protective sensation decreased with Simms Weinstein monofilament There is  an increase in medial arch height with cavus foot type and plantarflexed first ray bilaterally. on the left foot submetatarsal one is a ulceration with surrounding epidermal lysis.  100 there is a small abrasion type wound at this time measures approximate 0.1 x 0.1 cm and appears to be significantly improved compared to last appointment. On the right foot submetatarsal 4 is an annular hyperkeratotic lesion. Upon treatment small ulceration today measures 0.3 x 0.37 m without any probing, undermining or tunneling. There is no probing, undermining or tunneling to either the wounds. There is no swelling erythema, edema, increased warmth bilaterally. No other open lesions or pre-ulcer lesions identified bilaterally. There is no pain with calf compression, swelling, warmth, erythema.  Assessment: 50 year old male bilateral ulcerations, healing  Plan: -Treatment options discussed including all alternatives, risks, and complications -Etiology of symptoms were discussed -X-rays were obtained and reviewed with the patient.  No evidence of foreign body on the right foot. No definitive evidence of acute fracture or stress fracture. No osteomyelitis at this  time. -Etiology of symptoms were discussed -Wounds debrided 2 without complications or bleeding. Continue with antibiotic ointment and dressing changes daily. He has orthotics and I added a pad to the right side to help further offload the area. Given his foot type he may need more supportive inserts. He does have bracing however he does not wear them.  -Monitor for any clinical signs or symptoms of infection and directed to call the office immediately should any occur or go to the ER. -Follow-up as scheduled or sooner if any problems arise. In the meantime, encouraged to call the office with any questions, concerns, change in symptoms.   Ovid Curd, DPM

## 2015-07-13 ENCOUNTER — Ambulatory Visit (INDEPENDENT_AMBULATORY_CARE_PROVIDER_SITE_OTHER): Payer: BLUE CROSS/BLUE SHIELD | Admitting: Family Medicine

## 2015-07-13 ENCOUNTER — Encounter: Payer: Self-pay | Admitting: Family Medicine

## 2015-07-13 VITALS — BP 135/90 | HR 72 | Ht 76.0 in | Wt 245.0 lb

## 2015-07-13 DIAGNOSIS — M25512 Pain in left shoulder: Secondary | ICD-10-CM

## 2015-07-13 MED ORDER — METHYLPREDNISOLONE ACETATE 40 MG/ML IJ SUSP
40.0000 mg | Freq: Once | INTRAMUSCULAR | Status: AC
  Administered 2015-07-13: 40 mg via INTRA_ARTICULAR

## 2015-07-13 MED ORDER — MELOXICAM 15 MG PO TABS: ORAL_TABLET | ORAL | Status: DC

## 2015-07-16 NOTE — Progress Notes (Signed)
[name] - 50 y.o. male MRN [id number]  Date of birth: 1965-10-12  CC: LEft shoulder pain  SUBJECTIVE:   HPI  Mr. [name] is a pleasant 50 yo male who presents with persistent left shoulder pain.  He has been practicing yoga for ~ 1.5 years, and has experienced b/l shoulder pain for the majority of this time.  Practice modifications have helped improve his right shoulder pain. His left shoulder pain was been persistent and worsening.  Pain is currently 3/10, all day long. He recently had to leave a yoga practice early due to his level of pain.  Describes it as a vague lateral pain.  + night time pain.  NO steroid injections in the past.    ROS:     Denies fevers, chills, night sweats. No rash or other skin changes.  Denies other joint pain.    HISTORY: Past Medical, Surgical, Social, and Family History Reviewed & Updated per EMR.  Pertinent Historical Findings include: HTN, diabetes with A1c of 7.2 on insulin.   OBJECTIVE: BP 135/90 mmHg  Pulse 72  Ht  (1.93 m)  Wt 245 lb (111.131 kg)  BMI 29.83 kg/m2  Physical Exam   Calm, nad Non-labored breathing.   Shoulder: left  Inspection reveals no abnormalities, atrophy or asymmetry. Palpation is normal with no tenderness over AC joint. Equivocal TTP at bicipital groove. ROM is full in all planes. Rotator cuff strength normal throughout, except supra. Grossly positive signs of impingement with +Neer and Hawkin's tests, empty can. Speeds and Yergason's tests normal. No labral pathology noted with negative Obrien's, negative clunk and good stability. Normal scapular function observed. + painful arc and no drop arm sign. No apprehension sign  Shoulder: right Inspection reveals no abnormalities, atrophy or asymmetry. + over [facility] with + cross arm adduction.  ROM is full in all planes. Rotator cuff strength normal throughout. No signs of impingement with negative Neer and Hawkin's tests, empty can. Speeds and Yergason's tests  normal. No labral pathology noted with negative Obrien's, negative clunk and good stability. Normal scapular function observed. No painful arc and no drop arm sign. No apprehension sign  Imaging: [place] image of the L shoulder in both long and short axis obtained. Biceps tendon appears normal fibrillar pattern without surrounding effusion. Subscapularis appears normal without obvious tears or abnormalities. The supraspinatus tendon appears abnormal with an articular sided irregularity and mixed hyperechoic change  The infraspinatus and teres minor tendons appear normal with no tears and a good footprints. The subdeltoid/subacromial bursa is unremarkable and shows no impingement with dynamic motion. [facility] joint appears to have ample joint space and no spurring or effusion is present. 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    MEDICATIONS, LABS & OTHER ORDERS: Previous Medications   AMPHETAMINE SULFATE (EVEKEO) 10 MG TABS    Take 10 mg by mouth.   CARBAMAZEPINE (TEGRETOL XR) 200 MG 12 HR TABLET    Take by mouth.   FENOFIBRATE 160 MG TABLET    Take 160 mg by mouth.   GLUCOSE BLOOD (ONETOUCH VERIO) TEST STRIP       INSULIN ASPART (NOVOLOG) 100 UNIT/ML INJECTION    Inject 15 Units into the skin 3 (three) times daily before meals.    INSULIN GLARGINE (LANTUS) 100 UNIT/ML INJECTION    Inject 70 Units into the skin at bedtime.    INSULIN LISPRO (HUMALOG KWIKPEN) 100 UNIT/ML KIWKPEN       INSULIN PEN NEEDLE (  PEN NEEDLES 31GX5/16") 31G X 8 MM MISC       LYRICA 200 MG CAPSULE    TK ONE C PO  TID   RAMIPRIL (ALTACE) 2.5 MG CAPSULE    Take 2.5 mg by mouth.   VITAMIN D, ERGOCALCIFEROL, (DRISDOL) 50000 UNITS CAPS CAPSULE    Take by mouth.   ZOLPIDEM (AMBIEN) 10 MG TABLET    Take 10 mg by mouth.   Modified Medications   No medications on file   New Prescriptions   MELOXICAM (MOBIC) 15 MG TABLET    Take one a day for 10 days then as needed   Discontinued  Medications   PREGABALIN (LYRICA) 150 MG CAPSULE    Take 150 mg by mouth 2 (two) times daily.   SERTRALINE HCL (ZOLOFT PO)    Take by mouth.   ZOLPIDEM TARTRATE (AMBIEN PO)    Take by mouth.   Orders Placed This Encounter  Procedures  . Ambulatory referral to Physical Therapy   ASSESSMENT & PLAN: RC impingement, chronic, with apparent partial thickness articular surface tear:  - Subacromial injeciton performed. Discussed risks, especially as diabetic.  - D/C yoga for 1-2 months - PT for RC strengthening.  - Mobic 15 mg x 10 d then PRN.  Discussed cardiac risks.  - f/u 6 weeks.   Consent obtained and verified. Sterile betadine prep. Furthur cleansed with alcohol. Topical analgesic spray: Ethyl chloride. Joint: left subacromial  Approached in typical fashion with: posterolateral Completed without difficulty Meds: 4cc of 1% lido & 1 cc of depo Needle: spinal Aftercare instructions and Red flags advised.

## 2015-07-27 ENCOUNTER — Ambulatory Visit: Payer: BLUE CROSS/BLUE SHIELD | Admitting: Podiatry

## 2015-08-03 ENCOUNTER — Encounter: Payer: Self-pay | Admitting: Family Medicine

## 2015-08-03 ENCOUNTER — Ambulatory Visit
Admission: RE | Admit: 2015-08-03 | Discharge: 2015-08-03 | Disposition: A | Discharge status: Home / Self Care | Payer: BLUE CROSS/BLUE SHIELD | Source: Ambulatory Visit | Attending: Family Medicine | Admitting: Family Medicine

## 2015-08-03 ENCOUNTER — Ambulatory Visit (INDEPENDENT_AMBULATORY_CARE_PROVIDER_SITE_OTHER): Payer: BLUE CROSS/BLUE SHIELD | Admitting: Family Medicine

## 2015-08-03 VITALS — BP 143/87 | HR 91 | Ht 76.0 in | Wt 245.0 lb

## 2015-08-03 DIAGNOSIS — S46812D Strain of other muscles, fascia and tendons at shoulder and upper arm level, left arm, subsequent encounter: Secondary | ICD-10-CM

## 2015-08-03 DIAGNOSIS — M25512 Pain in left shoulder: Secondary | ICD-10-CM

## 2015-08-03 MED ORDER — MELOXICAM 15 MG PO TABS: ORAL_TABLET | ORAL | Status: DC

## 2015-08-03 MED ORDER — NITROGLYCERIN 0.2 MG/HR TD PT24: MEDICATED_PATCH | TRANSDERMAL | Status: DC

## 2015-08-06 NOTE — Progress Notes (Signed)
[name] - 50 y.o. male MRN [id number]  Date of birth: 10/23/1965  CC: LEft shoulder pain  SUBJECTIVE:   HPI  Mr. [name] is a pleasant 50 yo male who presents for f/u for persistent left shoulder pain.  He has been practicing yoga for ~ 1.5 years, and has experienced b/l shoulder pain for the majority of this time.  Practice modifications have helped improve his right shoulder pain. His left shoulder pain was been persistent and worsening. He was last seen on 07/13/2015 and had a subacromial injection at that time. Marland Kitchen. He has been using taking mobic regulary since his last visit. He is not sure if this is helping.  Pain is currently 4/10, all day long. He stopped doing yoga until this last week, but then significantly modified his practice with mild pain.  Describes it as a vague lateral pain.  + night time pain. [facility].     ROS:     Denies fevers, chills, night sweats. No rash or other skin changes.  Denies other joint pain.    HISTORY: Past Medical, Surgical, Social, and Family History Reviewed & Updated per EMR.  Pertinent Historical Findings include: HTN, diabetes with A1c of 7.2 on insulin.   OBJECTIVE: BP 143/87 mmHg  Pulse 91  Ht 6\' 4"  (1.93 m)  Wt 245 lb (111.131 kg)  BMI 29.83 kg/m2  Physical Exam   Calm, nad Non-labored breathing.   Shoulder: left  Inspection reveals no abnormalities, atrophy or asymmetry. Palpation is normal with no tenderness over AC joint. Equivocal TTP at bicipital groove. ROM is full in all planes. Rotator cuff strength normal throughout, except supraspinatus and subscap. Grossly positive signs of impingement with +Neer and Hawkin's tests, empty can. Speeds and Yergason's tests normal. No labral pathology noted with negative Obrien's, negative clunk and good stability. Normal scapular function observed. + painful arc and no drop arm sign. No apprehension sign  Imaging: US image of the L shoulder in both  long and short axis obtained. Biceps tendon appears normal fibrillar pattern without surrounding effusion. Subscapularis appears abnormal with partial width full thickness distal retraction at the insertion. The supraspinatus tendon appears abnormal with an articular sided irregularity and mixed hyperechoic change suggesting a full thickness, partial tear.   The infraspinatus and teres minor tendons appear normal with no tears and a good footprints. The subdeltoid/subacromial bursa is unremarkable and shows no impingement with dynamic motion. [facility] joint appears to have ample joint space and no spurring or effusion is present. 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    MEDICATIONS, LABS & OTHER ORDERS: Previous Medications   AMPHETAMINE SULFATE (EVEKEO) 10 MG TABS    Take 10 mg by mouth.   CARBAMAZEPINE (TEGRETOL XR) 200 MG 12 HR TABLET    Take by mouth.   FENOFIBRATE 160 MG TABLET    Take 160 mg by mouth.   GLUCOSE BLOOD (ONETOUCH VERIO) TEST STRIP       INSULIN ASPART (NOVOLOG) 100 UNIT/ML INJECTION    Inject 15 Units into the skin 3 (three) times daily before meals.    INSULIN GLARGINE (LANTUS) 100 UNIT/ML INJECTION    Inject 70 Units into the skin at bedtime.    INSULIN LISPRO (HUMALOG KWIKPEN) 100 UNIT/ML KIWKPEN       INSULIN PEN NEEDLE (PEN NEEDLES 31GX5/16") 31G X 8 MM MISC       LYRICA 200 MG CAPSULE  TK ONE C PO  TID   RAMIPRIL (ALTACE) 2.5 MG CAPSULE    Take 2.5 mg by mouth.   VITAMIN D, ERGOCALCIFEROL, (DRISDOL) 50000 UNITS CAPS CAPSULE    Take by mouth.   ZOLPIDEM (AMBIEN) 10 MG TABLET    Take 10 mg by mouth.   Modified Medications   Modified Medication Previous Medication   MELOXICAM (MOBIC) 15 MG TABLET meloxicam (MOBIC) 15 MG tablet      Take one a day for 10 days then take as needed    Take one a day for 10 days then as needed   New Prescriptions   NITROGLYCERIN (MINITRAN) 0.2 MG/HR PATCH    Place 1/4 patch to affected  area daily   Discontinued Medications   No medications on file   Orders Placed This Encounter  Procedures  . DG Shoulder Left  . MR Shoulder Left Wo Contrast   ASSESSMENT & PLAN: RC Partial tear of the supra, subscap: U/s showing partial tearing of the subscap and supra. Subacromial inj on 07/13/2015 - Continue to limit yoga. Must do RC strengthening. - Nitro - XR/MRI per patient preference - f/u after the MRI

## 2015-08-13 ENCOUNTER — Inpatient Hospital Stay: Admission: RE | Admit: 2015-08-13 | Payer: BLUE CROSS/BLUE SHIELD | Source: Ambulatory Visit

## 2015-08-31 ENCOUNTER — Encounter: Payer: Self-pay | Admitting: Family Medicine

## 2015-08-31 ENCOUNTER — Ambulatory Visit (INDEPENDENT_AMBULATORY_CARE_PROVIDER_SITE_OTHER): Payer: BLUE CROSS/BLUE SHIELD | Admitting: Family Medicine

## 2015-08-31 VITALS — BP 115/76 | HR 99 | Ht 76.0 in | Wt 245.0 lb

## 2015-08-31 DIAGNOSIS — M75102 Unspecified rotator cuff tear or rupture of left shoulder, not specified as traumatic: Secondary | ICD-10-CM | POA: Diagnosis not present

## 2015-08-31 DIAGNOSIS — M25512 Pain in left shoulder: Secondary | ICD-10-CM | POA: Diagnosis not present

## 2015-08-31 MED ORDER — NITROGLYCERIN 0.2 MG/HR TD PT24: MEDICATED_PATCH | TRANSDERMAL | Status: DC

## 2015-08-31 MED ORDER — MELOXICAM 15 MG PO TABS: ORAL_TABLET | ORAL | Status: DC

## 2015-08-31 NOTE — Progress Notes (Signed)
  [name] - 50 y.o. male MRN [id number]  Date of birth: 1965/10/23  CC: LEft shoulder pain  SUBJECTIVE:   HPI  [name] is a pleasant 50 yo male who presents for f/u for persistent left shoulder pain.  He has been practicing yoga for ~ 1.5 years, and has experienced b/l shoulder pain for the majority of this time.  Practice modifications have helped improve his right shoulder pain. His left shoulder pain was been persistent and worsening. Previously seen on 07/13/15 with [facility] and on 08/03/15 where US showed probable supraspinatus partial thickness tear. He has been using  mobic regulary since his last visit. He is not sure if this is helping.  Pain with overhead motion and at night.  Describes it as a vague lateral pain. RTC exercises 3-4 x per week.  X-rays performed on 08/03/15 did not show pathology and MRI was too expensive at the time for him to obtain.   ROS:     Denies fevers, chills, night sweats. No rash or other skin changes.  Denies other joint pain.    HISTORY: Past Medical, Surgical, Social, and Family History Reviewed & Updated per EMR.  Pertinent Historical Findings include: HTN, diabetes with A1c of 7.2 on insulin.   OBJECTIVE: BP 115/76 mmHg  Pulse 99  Ht 6\' 4"  (1.93 m)  Wt 245 lb (111.131 kg)  BMI 29.83 kg/m2  Physical Exam   Calm, nad Non-labored breathing.   Shoulder: left  Inspection reveals no abnormalities, atrophy or asymmetry. Palpation is normal with no tenderness over AC joint. No TTP bicipital groove ROM is full in all planes. 4/5 empty can on L >R Grossly positive signs of impingement with +Neer and Hawkin's test. Speeds and Yergason's tests normal. No labral pathology noted with negative Obrien's, negative clunk and good stability. Normal scapular function observed. + painful arc and no drop arm sign. No apprehension sign

## 2015-08-31 NOTE — Assessment & Plan Note (Signed)
Highly suspect patient has a supraspinatus partial-thickness to full-thickness tear. The fact that he is having nighttime symptoms as well as weakness with empty can is concerning. Previous ultrasounds were done which did show supraspinatus tearing. -Unable to afford MRI previously so he will continue to perform the exercises, nitroglycerin, Mobic. He gets to the point where this becomes more consistent with daily living or worsening symptoms he will get the MRI. If he greatly improves we will continue this course and hold off on further imaging.

## 2015-10-11 DIAGNOSIS — M65341 Trigger finger, right ring finger: Secondary | ICD-10-CM | POA: Insufficient documentation

## 2015-10-15 ENCOUNTER — Telehealth: Payer: Self-pay | Admitting: *Deleted

## 2015-10-15 NOTE — Telephone Encounter (Signed)
Authorization for MRI has expired, needs new auth completed.

## 2015-10-16 ENCOUNTER — Inpatient Hospital Stay: Admission: RE | Admit: 2015-10-16 | Payer: BLUE CROSS/BLUE SHIELD | Source: Ambulatory Visit

## 2016-01-18 ENCOUNTER — Encounter: Payer: Self-pay | Admitting: Family Medicine

## 2016-01-18 ENCOUNTER — Ambulatory Visit (INDEPENDENT_AMBULATORY_CARE_PROVIDER_SITE_OTHER): Payer: BLUE CROSS/BLUE SHIELD | Admitting: Family Medicine

## 2016-01-18 VITALS — BP 135/83 | HR 69 | Ht 76.0 in | Wt 245.0 lb

## 2016-01-18 DIAGNOSIS — M7502 Adhesive capsulitis of left shoulder: Secondary | ICD-10-CM

## 2016-01-18 DIAGNOSIS — M75102 Unspecified rotator cuff tear or rupture of left shoulder, not specified as traumatic: Secondary | ICD-10-CM | POA: Diagnosis not present

## 2016-01-18 NOTE — Assessment & Plan Note (Signed)
He reports that he has been favoring his left shoulder from his rotator cuff syndrome. He has lack of range of motion with external rotation, flexion and abduction. His MRI of his left shoulder has been delayed due to finances. - Referral to physical therapy. - He'll follow-up in 4-6 weeks if no improvement and have a glenohurmoral injection

## 2016-01-18 NOTE — Progress Notes (Signed)
[facility]: Attending Note: I have reviewed the chart, discussed wit the Sports Medicine Fellow. I agree with assessment and treatment plan as detailed in the Fellow's note. He does have a fair amount of loss of abduction on the left. He can only elevate to about 100 painless, and I can get him up with passive range of motion another 20 with some stretching type pain. We'll refer him for physical therapy. It we also discussed glenohumeral joint injection. Should he wish to pursue that he'll follow-up and have follow-up if not improving with physical therapy.
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
## 2016-04-23 ENCOUNTER — Ambulatory Visit: Payer: BLUE CROSS/BLUE SHIELD | Admitting: Physical Therapy

## 2016-04-30 ENCOUNTER — Encounter: Payer: Self-pay | Admitting: Physical Therapy

## 2016-04-30 ENCOUNTER — Ambulatory Visit: Payer: BLUE CROSS/BLUE SHIELD | Attending: Family Medicine | Admitting: Physical Therapy

## 2016-04-30 DIAGNOSIS — M25512 Pain in left shoulder: Secondary | ICD-10-CM

## 2016-04-30 DIAGNOSIS — M25612 Stiffness of left shoulder, not elsewhere classified: Secondary | ICD-10-CM

## 2016-04-30 NOTE — Therapy (Signed)
[facility] [address] [place], [place], [address] Phone: [phone]   Fax:  [phone]  Physical Therapy Evaluation  Patient Details  Name: [name] MRN: [id number] Date of Birth: 02-04-1966 Referring Provider: Nestor RampSara L Neal, MD  Encounter Date: 04/30/2016      PT End of Session - 04/30/16 1346    Visit Number 1   Number of Visits 13   Date for PT Re-Evaluation 06/13/16   Authorization Type BCBS 30 visit limit   PT Start Time 1335   PT Stop Time 1414   PT Time Calculation (min) 39 min   Activity Tolerance Patient tolerated treatment well   Behavior During Therapy [facility] for tasks assessed/performed      Past Medical History:  Diagnosis Date  . ADHD (attention deficit hyperactivity disorder)   . Anxiety   . Anxiety and depression   . Depression   . Diabetes type 2, controlled (HCC)   . Diverticulosis   . Hyperlipidemia   . Kidney stones   . Pancreatitis   . Stomach ulcer     Past Surgical History:  Procedure Laterality Date  . ANKLE SURGERY    . CERVICAL DISC SURGERY     C4-C5  . ELBOW SURGERY    . RETINAL DETACHMENT SURGERY    . RHINOPLASTY    . VASECTOMY      There were no vitals filed for this visit.       Subjective Assessment - 04/30/16 1338    Subjective pt reports shoulder injury was believed to be RC tear which improved by resting but shoulder became frozen. Most limitation in abduction, end range flexion; donning long sleeve coats. Tnder to palpation anterior and lateral shoulder.    Currently in Pain? Yes   Pain Score 1   3-4/10 with movement   Pain Location Shoulder   Pain Orientation Left   Pain Descriptors / Indicators --  stuck   Aggravating Factors  abduction, end range flexion, reaching behind back   Pain Relieving Factors rest            [facility] Assessment - 04/30/16 0001      Assessment   Medical Diagnosis Adhesive capsulitis   Referring Provider Nestor RampSara L Neal,  MD   Hand Dominance Right   Next MD Visit not scheduled     Precautions   Precautions None     Restrictions   Weight Bearing Restrictions No     Balance Screen   Has the patient fallen in the past 6 months No     Home Environment   Living Environment Private residence     Prior Function   Level of Independence Independent     Cognition   Overall Cognitive Status Within Functional Limits for tasks assessed     Observation/Other Assessments   Focus on Therapeutic Outcomes (FOTO)  58% ability     Sensation   Additional Comments [facility]     Posture/Postural Control   Posture Comments kyphotic, forward head, protracted scapula     ROM / Strength   AROM / PROM / Strength AROM;PROM;Strength     AROM   Overall AROM Comments poor scapular control   AROM Assessment Site Shoulder   Right/Left Shoulder Right;Left   Right Shoulder Flexion 152 Degrees   Right Shoulder ABduction 154 Degrees   Right Shoulder Internal Rotation --  T8   Left Shoulder Flexion 120 Degrees  133 following treatment   Left Shoulder ABduction 110  Degrees   Left Shoulder Internal Rotation --  L3     PROM   PROM Assessment Site Shoulder   Right/Left Shoulder Right;Left   Left Shoulder Flexion 144 Degrees   Left Shoulder ABduction 112 Degrees   Left Shoulder Internal Rotation 50 Degrees   Left Shoulder External Rotation 40 Degrees     Strength   Overall Strength Comments elbow flexion strong, painful   Strength Assessment Site Shoulder   Right/Left Shoulder Right;Left   Left Shoulder Flexion 4/5   Left Shoulder ABduction 4/5   Left Shoulder External Rotation 4-/5     Palpation   Palpation comment TTP biceps tendon.                   [facility] Adult PT Treatment/Exercise - 04/30/16 0001      Exercises   Exercises Shoulder     Shoulder Exercises: Stretch   Table Stretch -Flexion Limitations x10 with dowel   Other Shoulder Stretches sleeper stretch     Manual Therapy   Manual  Therapy Myofascial release;Passive ROM   Myofascial Release subscapularis   Passive ROM flx, ER, IR                PT Education - 04/30/16 1546    Education provided Yes   Education Details anatomy of condition, POC, HEP, exercise form/rationale   Person(s) Educated Patient   Methods Explanation;Demonstration;Tactile cues;Verbal cues;Handout   Comprehension Verbalized understanding;Returned demonstration;Verbal cues required;Tactile cues required;Need further instruction          PT Short Term Goals - 04/30/16 1550      PT SHORT TERM GOAL #1   Title Increase in flexion and abduction ROM acitvely by 10 deg by 12/22   Baseline see flowsheet   Time 3   Period Weeks   Status New     PT SHORT TERM GOAL #2   Title Independent in HEP as it has been established    Baseline began establishing at eval   Time 3   Period Weeks   Status New           PT Long Term Goals - 04/30/16 1551      PT LONG TERM GOAL #1   Title Pt will be able to reach behind back to at least T10 to demo improved functional ROM by 06/13/16   Baseline L3 at eval   Time 6   Period Weeks   Status New     PT LONG TERM GOAL #2   Title Pt will be able to don jacket with minimal discomfort in shoulder   Baseline verbalized 5/10 pain at eval   Time 6   Period Weeks     PT LONG TERM GOAL #3   Title FOTO to 71% ability to indicate significant functional improvment.    Baseline 58% at eval   Time 6   Period Weeks   Status New     PT LONG TERM GOAL #4   Title Pt will demo MMT 5/5 to indicate necessary stability provided by [facility] and periscapular musculature   Baseline see flowsheet   Time 6   Period Weeks   Status New               Plan - 04/30/16 1546    Clinical Impression Statement Pt presents to PT with complaints of L shoulder pain of insidious onset. Has a history of adhesive capsulitis in R shoulder. Notable increase in ROM (13 deg) into flexion following  manual treatment to  subscapularis. Pt will benefit from skilled PT in order to appropriately treat ROM and functional movment of UE as he moves through the phases of adhesive capsulitis.    Rehab Potential Good   PT Frequency 2x / week   PT Duration 6 weeks   PT Treatment/Interventions ADLs/Self Care Home Management;Cryotherapy;Electrical Stimulation;Iontophoresis 4mg /ml Dexamethasone;Functional mobility training;Ultrasound;Moist Heat;Therapeutic activities;Therapeutic exercise;Neuromuscular re-education;Patient/family education;Passive range of motion;Manual techniques;Dry needling;Taping   PT Next Visit Plan subscap stretch/trigger point release, periscapular control   PT Home Exercise Plan supine flx with wand, sleeper stretch, scapular retraction/postural awareness   Consulted and Agree with Plan of Care Patient      Patient will benefit from skilled therapeutic intervention in order to improve the following deficits and impairments:  Decreased range of motion, Impaired UE functional use, Increased muscle spasms, Decreased activity tolerance, Pain, Improper body mechanics, Decreased strength, Postural dysfunction  Visit Diagnosis: Left shoulder pain, unspecified chronicity - Plan: PT plan of care cert/re-cert  Stiffness of left shoulder, not elsewhere classified - Plan: PT plan of care cert/re-cert     Problem List Patient Active Problem List   Diagnosis Date Noted  . Adhesive capsulitis of left shoulder 01/18/2016  . Adhesive capsulitis of right shoulder 03/24/2011  . Retinal detachment 12/06/2010  . Rotator cuff syndrome 12/06/2010  . History of pancreatitis 10/22/2010  . Hypertriglyceridemia 10/22/2010  . Diabetes mellitus type II, uncontrolled (HCC) 10/22/2010    Mickey Esguerra C. Leelynn Whetsel PT, DPT 04/30/16 3:59 PM   [facility] [address] [place], [place], [address] Phone: [phone]   Fax:  [phone]  Name: [name] MRN:  [id number] Date of Birth: 08-17-1965

## 2016-05-07 ENCOUNTER — Ambulatory Visit: Payer: BLUE CROSS/BLUE SHIELD | Attending: Family Medicine | Admitting: Physical Therapy

## 2016-05-07 ENCOUNTER — Encounter: Payer: Self-pay | Admitting: Physical Therapy

## 2016-05-07 DIAGNOSIS — M25512 Pain in left shoulder: Secondary | ICD-10-CM | POA: Diagnosis present

## 2016-05-07 DIAGNOSIS — M25612 Stiffness of left shoulder, not elsewhere classified: Secondary | ICD-10-CM | POA: Diagnosis present

## 2016-05-07 NOTE — Therapy (Signed)
[facility] [address] [place], [place], [address] Phone: [phone]   Fax:  [phone]  Physical Therapy Treatment  Patient Details  Name: [name] MRN: [id number] Date of Birth: 08/10/1965 Referring Provider: Nestor RampSara L Neal, MD  Encounter Date: 05/07/2016      PT End of Session - 05/07/16 0930    Visit Number 2   Number of Visits 13   Date for PT Re-Evaluation 06/13/16   Authorization Type BCBS 30 visit limit   PT Start Time 0930   PT Stop Time 1014   PT Time Calculation (min) 44 min   Activity Tolerance Patient tolerated treatment well   Behavior During Therapy [facility] for tasks assessed/performed      Past Medical History:  Diagnosis Date  . ADHD (attention deficit hyperactivity disorder)   . Anxiety   . Anxiety and depression   . Depression   . Diabetes type 2, controlled (HCC)   . Diverticulosis   . Hyperlipidemia   . Kidney stones   . Pancreatitis   . Stomach ulcer     Past Surgical History:  Procedure Laterality Date  . ANKLE SURGERY    . CERVICAL DISC SURGERY     C4-C5  . ELBOW SURGERY    . RETINAL DETACHMENT SURGERY    . RHINOPLASTY    . VASECTOMY      There were no vitals filed for this visit.      Subjective Assessment - 05/07/16 0932    Subjective Always a little dull ache but not much pain. Has been trying to focus on sitting upright.    Currently in Pain? No/denies            [facility] - 05/07/16 0001      AROM   Left Shoulder Flexion 133 Degrees                     [facility] Adult PT Treatment/Exercise - 05/07/16 0001      Shoulder Exercises: Supine   Horizontal ABduction 20 reps   Theraband Level (Shoulder Horizontal ABduction) Level 2 (Red)   Horizontal ABduction Limitations 5s holds, focus on scapular retraction   Flexion 15 reps   Shoulder Flexion Weight (lbs) 2   Flexion Limitations pain free range     Shoulder Exercises: Seated    External Rotation 20 reps   Theraband Level (Shoulder External Rotation) Level 2 (Red)     Shoulder Exercises: Prone   Extension 15 reps  quick lift, slow ecc lower, 2lb   External Rotation 15 reps   External Rotation Weight (lbs) 2   External Rotation Limitations row to ER   Horizontal ABduction 1 15 reps   Horizontal ABduction 1 Weight (lbs) 2   Horizontal ABduction 1 Limitations thumb up   Other Prone Exercises prone on elbows serratus press     Shoulder Exercises: Pulleys   Flexion 2 minutes   Other Pulley Exercises IR behind back 2 min     Shoulder Exercises: ROM/Strengthening   UBE (Upper Arm Bike) retro 5 min L1.5   Other ROM/Strengthening Exercises UE ranger flexion     Shoulder Exercises: Stretch   Other Shoulder Stretches sleeper stretch                PT Education - 05/07/16 0941    Education provided Yes   Education Details exercise form/rationale, HEP, shoulder anatomy, control   Person(s) Educated Patient   Methods Explanation;Demonstration;Tactile cues;Verbal  cues;Handout   Comprehension Verbalized understanding;Returned demonstration;Verbal cues required;Tactile cues required;Need further instruction          PT Short Term Goals - 04/30/16 1550      PT SHORT TERM GOAL #1   Title Increase in flexion and abduction ROM acitvely by 10 deg by 12/22   Baseline see flowsheet   Time 3   Period Weeks   Status New     PT SHORT TERM GOAL #2   Title Independent in HEP as it has been established    Baseline began establishing at eval   Time 3   Period Weeks   Status New           PT Long Term Goals - 04/30/16 1551      PT LONG TERM GOAL #1   Title Pt will be able to reach behind back to at least T10 to demo improved functional ROM by 06/13/16   Baseline L3 at eval   Time 6   Period Weeks   Status New     PT LONG TERM GOAL #2   Title Pt will be able to don jacket with minimal discomfort in shoulder   Baseline verbalized 5/10 pain at eval    Time 6   Period Weeks     PT LONG TERM GOAL #3   Title FOTO to 71% ability to indicate significant functional improvment.    Baseline 58% at eval   Time 6   Period Weeks   Status New     PT LONG TERM GOAL #4   Title Pt will demo MMT 5/5 to indicate necessary stability provided by [facility] and periscapular musculature   Baseline see flowsheet   Time 6   Period Weeks   Status New               Plan - 05/07/16 1000    Clinical Impression Statement Pt required cuing to avoid over stretching of shoulder. 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 Discussed importance in control of shoulder and periscapular musculature as he increases ROM.    PT Next Visit Plan DN periscapular   PT Home Exercise Plan supine flx with wand, sleeper stretch, scapular retraction/postural awareness, child pose, seated ER red tband every hour while on computer   Consulted and Agree with Plan of Care Patient      Patient will benefit from skilled therapeutic intervention in order to improve the following deficits and impairments:     Visit Diagnosis: Left shoulder pain, unspecified chronicity  Stiffness of left shoulder, not elsewhere classified     Problem List Patient Active Problem List   Diagnosis Date Noted  . Adhesive capsulitis of left shoulder 01/18/2016  . Adhesive capsulitis of right shoulder 03/24/2011  . Retinal detachment 12/06/2010  . Rotator cuff syndrome 12/06/2010  . History of pancreatitis 10/22/2010  . Hypertriglyceridemia 10/22/2010  . Diabetes mellitus type II, uncontrolled (HCC) 10/22/2010    Quinne Pires C. Kristene Liberati PT, DPT 05/07/16 11:12 AM   [facility] [address] [place], [place], [address] Phone: [phone]   Fax:  [phone]  Name: [name] MRN: [id number] Date of Birth: 08-19-1965

## 2016-05-07 NOTE — Addendum Note (Signed)
Addended by: Army FossaHIGHTOWER, Rommel Hogston C on: 05/07/2016 11:15 AM   Modules accepted: Orders

## 2016-05-08 ENCOUNTER — Ambulatory Visit: Payer: BLUE CROSS/BLUE SHIELD | Admitting: Physical Therapy

## 2016-05-08 DIAGNOSIS — M25512 Pain in left shoulder: Secondary | ICD-10-CM

## 2016-05-08 DIAGNOSIS — M25612 Stiffness of left shoulder, not elsewhere classified: Secondary | ICD-10-CM

## 2016-05-08 NOTE — Therapy (Signed)
[name], [name], [name] Phone: [phone]   Fax:  [phone]  Physical Therapy Treatment  Patient Details  Name: [name] MRN: [id number] Date of Birth: 09/07/1965 Referring Provider: Nestor RampSara L Neal, MD  Encounter Date: 05/08/2016      [name] End of Session - 05/07/16 0930    Visit Number 2   Number of Visits 17   Date for [name] Re-Evaluation 06/13/16   Authorization Type BCBS 30 visit limit   [name] Start Time 0930   [name] Stop Time 1014   [name] Time Calculation (min) 44 min   Activity Tolerance Patient tolerated treatment well   Behavior During Therapy [facility] for tasks assessed/performed      Past Medical History:  Diagnosis Date  . ADHD (attention deficit hyperactivity disorder)   . Anxiety   . Anxiety and depression   . Depression   . Diabetes type 2, controlled (HCC)   . Diverticulosis   . Hyperlipidemia   . Kidney stones   . Pancreatitis   . Stomach ulcer     Past Surgical History:  Procedure Laterality Date  . ANKLE SURGERY    . CERVICAL DISC SURGERY     C4-C5  . ELBOW SURGERY    . RETINAL DETACHMENT SURGERY    . RHINOPLASTY    . VASECTOMY      There were no vitals filed for this visit.      Subjective Assessment - 05/08/16 1505    Subjective I am willing to try dry needling . I have  been hurting for so long   Pain Score 4   when moving arm   Pain Location Shoulder   Pain Orientation Left   Pain Descriptors / Indicators Tightness  stuck as was last week            [facility] [name] Assessment - 05/08/16 1543      AROM   Left Shoulder Flexion 139 Degrees   Left Shoulder ABduction 118 Degrees   Left Shoulder Internal Rotation 54 Degrees  90 degree abd supine                     [facility] Adult [name] Treatment/Exercise - 05/08/16 1505      Self-Care   Self-Care Other Self-Care Comments   Other Self-Care Comments  education on trigger point dry needling aftercare  and precautians     Modalities   Modalities Moist Heat     Moist Heat Therapy   Number Minutes Moist Heat 15 Minutes   Moist Heat Location Shoulder  left     Manual Therapy   Manual Therapy Joint mobilization;Soft tissue mobilization   Joint Mobilization left shoulder grad 3 mobs for IR with distraction grade 3/4   Soft tissue mobilization left upper trap and levator sub scapular and rhomboids     Neck Exercises: Stretches   Upper Trapezius Stretch 2 reps;30 seconds   Upper Trapezius Stretch Limitations bil   Levator Stretch 2 reps;30 seconds   Levator Stretch Limitations bil   Neck Stretch Limitations neck retraction 5 x 5-10 sec hold          Trigger Point Dry Needling - 05/08/16 1512    Consent Given? Yes   Education Handout Provided Yes   Muscles Treated Upper Body Upper trapezius;Levator scapulae;Subscapularis;Rhomboids  left side only   Upper Trapezius Response Twitch reponse elicited;Palpable increased muscle length   Levator Scapulae Response Palpable increased muscle length  Rhomboids Response Palpable increased muscle length   Subscapularis Response Twitch response elicited;Palpable increased muscle length              [name] Education - 05/08/16 1508    Education provided Yes   Education Details Trigger point dry needling education , aftercare , precautians and neck stretches for levator, upper trap and neck retraction   Person(s) Educated Patient   Methods Explanation;Demonstration;Tactile cues;Verbal cues;Handout   Comprehension Verbalized understanding;Returned demonstration          [name] Short Term Goals - 04/30/16 1550      [name] SHORT TERM GOAL #1   Title Increase in flexion and abduction ROM acitvely by 10 deg by 12/22   Baseline see flowsheet   Time 3   Period Weeks   Status New     [name] SHORT TERM GOAL #2   Title Independent in HEP as it has been established    Baseline began establishing at eval   Time 3   Period Weeks   Status New            [name] Long Term Goals - 04/30/16 1551      [name] LONG TERM GOAL #1   Title [name] will be able to reach behind back to at least T10 to demo improved functional ROM by 06/13/16   Baseline L3 at eval   Time 6   Period Weeks   Status New     [name] LONG TERM GOAL #2   Title [name] will be able to don jacket with minimal discomfort in shoulder   Baseline verbalized 5/10 pain at eval   Time 6   Period Weeks     [name] LONG TERM GOAL #3   Title FOTO to 71% ability to indicate significant functional improvment.    Baseline 58% at eval   Time 6   Period Weeks   Status New     [name] LONG TERM GOAL #4   Title [name] will demo MMT 5/5 to indicate necessary stability provided by [facility] and periscapular musculature   Baseline see flowsheet   Time 6   Period Weeks   Status New               Plan - 05/08/16 1545    Clinical Impression Statement [name] consents to trigger point dry needling and was monitored closely entire time.  [name] reponded well and tolerated TDN with increased AROM of left shoulder post needling.  Left shoulder flex 139, abd 118, and IR 54 at 90 degrees of abduction.  May continue to need TDN in conjunction with other [name] to maximize AROM   Rehab Potential Good   [name] Frequency 3x / week   [name] Duration 6 weeks   [name] Treatment/Interventions ADLs/Self Care Home Management;Cryotherapy;Electrical Stimulation;Iontophoresis 4mg /ml Dexamethasone;Functional mobility training;Ultrasound;Moist Heat;Therapeutic activities;Therapeutic exercise;Neuromuscular re-education;Patient/family education;Passive range of motion;Manual techniques;Dry needling;Taping   [name] Next Visit Plan assess benefit of TDN and continue maximizing range   [name] Home Exercise Plan supine flx with wand, sleeper stretch, scapular retraction/postural awareness, child pose, seated ER red tband every hour while on computer, Upper trap andlevator stretch on left   Consulted and Agree with Plan of Care Patient      Patient will benefit from  skilled therapeutic intervention in order to improve the following deficits and impairments:  Decreased range of motion, Impaired UE functional use, Increased muscle spasms, Decreased activity tolerance, Pain, Improper body mechanics, Decreased strength, Postural dysfunction  Visit Diagnosis: Left shoulder pain, unspecified  chronicity  Stiffness of left shoulder, not elsewhere classified     Problem List Patient Active Problem List   Diagnosis Date Noted  . Adhesive capsulitis of left shoulder 01/18/2016  . Adhesive capsulitis of right shoulder 03/24/2011  . Retinal detachment 12/06/2010  . Rotator cuff syndrome 12/06/2010  . History of pancreatitis 10/22/2010  . Hypertriglyceridemia 10/22/2010  . Diabetes mellitus type II, uncontrolled (HCC) 10/22/2010    [name], [name] Exercise Expert for the Aging Adult  05/08/16 4:58 PM Phone: [phone] Fax: [phone]  [facility] [address] [place], [place], [address] Phone: [phone]   Fax:  [phone]  Name: [name] MRN: [id number] Date of Birth: 1966/03/09

## 2016-05-08 NOTE — Patient Instructions (Signed)
Trigger Point Dry Needling  . What is Trigger Point Dry Needling (DN)? o DN is a physical therapy technique used to treat muscle pain and dysfunction. Specifically, DN helps deactivate muscle trigger points (muscle knots).  o A thin filiform needle is used to penetrate the skin and stimulate the underlying trigger point. The goal is for a local twitch response (LTR) to occur and for the trigger point to relax. No medication of any kind is injected during the procedure.   . What Does Trigger Point Dry Needling Feel Like?  o The procedure feels different for each individual patient. Some patients report that they do not actually feel the needle enter the skin and overall the process is not painful. Very mild bleeding may occur. However, many patients feel a deep cramping in the muscle in which the needle was inserted. This is the local twitch response.   Marland Kitchen. How Will I feel after the treatment? o Soreness is normal, and the onset of soreness may not occur for a few hours. Typically this soreness does not last longer than two days.  o Bruising is uncommon, however; ice can be used to decrease any possible bruising.  o In rare cases feeling tired or nauseous after the treatment is normal. In addition, your symptoms may get worse before they get better, this period will typically not last longer than 24 hours.   . What Can I do After My Treatment? o Increase your hydration by drinking more water for the next 24 hours. o You may place ice or heat on the areas treated that have become sore, however, do not use heat on inflamed or bruised areas. Heat often brings more relief post needling. o You can continue your regular activities, but vigorous activity is not recommended initially after the treatment for 24 hours. o DN is best combined with other physical therapy such as strengthening, stretching, and other therapies.     Garen LahLawrie Beardsley, PT Exercise Expert for the Aging Adult  05/08/16 3:08  PM Phone: [phone] Fax: [phone]

## 2016-05-12 ENCOUNTER — Ambulatory Visit: Payer: BLUE CROSS/BLUE SHIELD | Admitting: Physical Therapy

## 2016-05-12 ENCOUNTER — Encounter: Payer: Self-pay | Admitting: Physical Therapy

## 2016-05-12 DIAGNOSIS — M25612 Stiffness of left shoulder, not elsewhere classified: Secondary | ICD-10-CM

## 2016-05-12 DIAGNOSIS — M25512 Pain in left shoulder: Secondary | ICD-10-CM

## 2016-05-12 NOTE — Therapy (Signed)
[facility] [address] [place], [place], [address] Phone: [phone]   Fax:  [phone]  Physical Therapy Treatment  Patient Details  Name: [name] MRN: [id number] Date of Birth: Oct 10, 1965 Referring Provider: Nestor RampSara L Neal, MD  Encounter Date: 05/12/2016      PT End of Session - 05/12/16 1106    Visit Number 3   Number of Visits 17   Date for PT Re-Evaluation 06/13/16   Authorization Type BCBS 30 visit limit   PT Start Time 1105   PT Stop Time 1158   PT Time Calculation (min) 53 min   Activity Tolerance Patient tolerated treatment well   Behavior During Therapy [facility] for tasks assessed/performed      Past Medical History:  Diagnosis Date  . ADHD (attention deficit hyperactivity disorder)   . Anxiety   . Anxiety and depression   . Depression   . Diabetes type 2, controlled (HCC)   . Diverticulosis   . Hyperlipidemia   . Kidney stones   . Pancreatitis   . Stomach ulcer     Past Surgical History:  Procedure Laterality Date  . ANKLE SURGERY    . CERVICAL DISC SURGERY     C4-C5  . ELBOW SURGERY    . RETINAL DETACHMENT SURGERY    . RHINOPLASTY    . VASECTOMY      There were no vitals filed for this visit.      Subjective Assessment - 05/12/16 1106    Subjective Pt reports shoulder felt better after DN. Helped sister move lawn furniture that was not very heavy and experienced twitching in lateral deltoid.    Currently in Pain? No/denies            [facility] - 05/12[facility]16 0001      AROM   Left Shoulder Flexion 123 Degrees                     [facility] Adult PT Treatment/Exercise - 05/12/16 0001      Shoulder Exercises: Prone   Flexion 15 reps   Flexion Limitations edge of bed   Extension 15 reps   Horizontal ABduction 1 15 reps   Horizontal ABduction 1 Limitations thumb up     Shoulder Exercises: Sidelying   ABduction Left;15 reps   Other Sidelying Exercises PT  resisted scapular protraction/retraction     Shoulder Exercises: Standing   Extension 15 reps   Theraband Level (Shoulder Extension) Level 3 (Green)   Extension Limitations 5s holds     Shoulder Exercises: Pulleys   Flexion 2 minutes     Shoulder Exercises: ROM/Strengthening   UBE (Upper Arm Bike) 3'/3' L 2     Moist Heat Therapy   Number Minutes Moist Heat 15 Minutes   Moist Heat Location Shoulder     Manual Therapy   Joint Mobilization GHJ flx blocking scapula, scapular distraction   Myofascial Release subscap, lat, upper trap L, periscap in sidelying                PT Education - 05/12/16 1108    Education provided Yes   Education Details exercise form/rationale, HEP.    Person(s) Educated Patient   Methods Explanation;Demonstration;Tactile cues;Verbal cues   Comprehension Verbalized understanding;Returned demonstration;Verbal cues required;Tactile cues required;Need further instruction          PT Short Term Goals - 04/30/16 1550      PT SHORT TERM GOAL #1   Title  Increase in flexion and abduction ROM acitvely by 10 deg by 12/22   Baseline see flowsheet   Time 3   Period Weeks   Status New     PT SHORT TERM GOAL #2   Title Independent in HEP as it has been established    Baseline began establishing at eval   Time 3   Period Weeks   Status New           PT Long Term Goals - 04/30/16 1551      PT LONG TERM GOAL #1   Title Pt will be able to reach behind back to at least T10 to demo improved functional ROM by 06/13/16   Baseline L3 at eval   Time 6   Period Weeks   Status New     PT LONG TERM GOAL #2   Title Pt will be able to don jacket with minimal discomfort in shoulder   Baseline verbalized 5/10 pain at eval   Time 6   Period Weeks     PT LONG TERM GOAL #3   Title FOTO to 71% ability to indicate significant functional improvment.    Baseline 58% at eval   Time 6   Period Weeks   Status New     PT LONG TERM GOAL #4   Title Pt  will demo MMT 5/5 to indicate necessary stability provided by [facility] and periscapular musculature   Baseline see flowsheet   Time 6   Period Weeks   Status New               Plan - 05/12/16 1241    Clinical Impression Statement Focused today on stability of periscapular region to decrease hike and impingement.    PT Next Visit Plan ROM   PT Home Exercise Plan supine flx with wand, sleeper stretch, scapular retraction/postural awareness, child pose, seated ER red tband every hour while on computer, Upper trap andlevator stretch on left   Consulted and Agree with Plan of Care Patient      Patient will benefit from skilled therapeutic intervention in order to improve the following deficits and impairments:     Visit Diagnosis: Left shoulder pain, unspecified chronicity  Stiffness of left shoulder, not elsewhere classified     Problem List Patient Active Problem List   Diagnosis Date Noted  . Adhesive capsulitis of left shoulder 01/18/2016  . Adhesive capsulitis of right shoulder 03/24/2011  . Retinal detachment 12/06/2010  . Rotator cuff syndrome 12/06/2010  . History of pancreatitis 10/22/2010  . Hypertriglyceridemia 10/22/2010  . Diabetes mellitus type II, uncontrolled (HCC) 10/22/2010   Anita Mcadory C. Shakiara Lukic PT, DPT 05/12/16 12:44 PM   [facility] [address] [place], [place], [address] Phone: [phone]   Fax:  [phone]  Name: [name] MRN: [id number] Date of Birth: 02-21-1966

## 2016-05-13 ENCOUNTER — Encounter: Payer: Self-pay | Admitting: Physical Therapy

## 2016-05-13 ENCOUNTER — Ambulatory Visit: Payer: BLUE CROSS/BLUE SHIELD | Admitting: Physical Therapy

## 2016-05-13 DIAGNOSIS — M25612 Stiffness of left shoulder, not elsewhere classified: Secondary | ICD-10-CM

## 2016-05-13 DIAGNOSIS — M25512 Pain in left shoulder: Secondary | ICD-10-CM

## 2016-05-13 NOTE — Therapy (Signed)
[facility] [address] [place], [place], [address] Phone: [phone]   Fax:  [phone]  Physical Therapy Treatment  Patient Details  Name: Erik SoloSamuel B Basher MRN: [id number] Date of Birth: [birth year] Referring Provider: Nestor RampSara L Neal, MD  Encounter Date: 05/13/2016      PT End of Session - 05/13/16 0804    Visit Number 4   Number of Visits 17   Date for PT Re-Evaluation 06/13/16   Authorization Type BCBS 30 visit limit   PT Start Time 0802   PT Stop Time 0852   PT Time Calculation (min) 50 min   Activity Tolerance Patient tolerated treatment well   Behavior During Therapy [facility] for tasks assessed/performed      Past Medical History:  Diagnosis Date  . ADHD (attention deficit hyperactivity disorder)   . Anxiety   . Anxiety and depression   . Depression   . Diabetes type 2, controlled (HCC)   . Diverticulosis   . Hyperlipidemia   . Kidney stones   . Pancreatitis   . Stomach ulcer     Past Surgical History:  Procedure Laterality Date  . ANKLE SURGERY    . CERVICAL DISC SURGERY     C4-C5  . ELBOW SURGERY    . RETINAL DETACHMENT SURGERY    . RHINOPLASTY    . VASECTOMY      There were no vitals filed for this visit.      Subjective Assessment - 05/13/16 0804    Subjective feeling better than normal today   Currently in Pain? No/denies            [facility] - 05/13/16 0001      AROM   Left Shoulder Flexion 139 Degrees                     [facility] Adult PT Treatment/Exercise - 05/13/16 0001      Shoulder Exercises: Supine   Other Supine Exercises PT resisted PNFs     Shoulder Exercises: Seated   Theraband Level (Shoulder External Rotation) Level 2 (Red)     Shoulder Exercises: Pulleys   Flexion 2 minutes   Other Pulley Exercises IR behind back     Shoulder Exercises: Stretch   Table Stretch - ABduction Limitations thoracic ext over bolster   Other Shoulder Stretches  child pose   Other Shoulder Stretches sleeper stretch     Moist Heat Therapy   Number Minutes Moist Heat 10 Minutes   Moist Heat Location Other (comment)  tspine in prone     Manual Therapy   Joint Mobilization end range inf glide in flx and abd; prone tspine PA, L rib ER upper tspine   Myofascial Release lat, subscap                PT Education - 05/13/16 0804    Education provided Yes   Education Details exercise form/rationale   Person(s) Educated Patient   Methods Explanation;Demonstration;Tactile cues;Verbal cues   Comprehension Verbalized understanding;Returned demonstration;Verbal cues required;Tactile cues required;Need further instruction          PT Short Term Goals - 04/30/16 1550      PT SHORT TERM GOAL #1   Title Increase in flexion and abduction ROM acitvely by 10 deg by 12/22   Baseline see flowsheet   Time 3   Period Weeks   Status New     PT SHORT TERM GOAL #2   Title Independent in  HEP as it has been established    Baseline began establishing at eval   Time 3   Period Weeks   Status New           PT Long Term Goals - 04/30/16 1551      PT LONG TERM GOAL #1   Title Pt will be able to reach behind back to at least T10 to demo improved functional ROM by 06/13/16   Baseline L3 at eval   Time 6   Period Weeks   Status New     PT LONG TERM GOAL #2   Title Pt will be able to don jacket with minimal discomfort in shoulder   Baseline verbalized 5/10 pain at eval   Time 6   Period Weeks     PT LONG TERM GOAL #3   Title FOTO to 71% ability to indicate significant functional improvment.    Baseline 58% at eval   Time 6   Period Weeks   Status New     PT LONG TERM GOAL #4   Title Pt will demo MMT 5/5 to indicate necessary stability provided by [facility] and periscapular musculature   Baseline see flowsheet   Time 6   Period Weeks   Status New               Plan - 05/13/16 16100843    Clinical Impression Statement Limited mobility  in Rib ER increased with manual treatment, improved extension for upright posture through thoracic spine noted following.    PT Next Visit Plan DN PRN cervical and GHJ, lat dorsi.    PT Home Exercise Plan supine flx with wand, sleeper stretch, scapular retraction/postural awareness, child pose, seated ER red tband every hour while on computer, Upper trap andlevator stretch on left      Patient will benefit from skilled therapeutic intervention in order to improve the following deficits and impairments:     Visit Diagnosis: Left shoulder pain, unspecified chronicity  Stiffness of left shoulder, not elsewhere classified     Problem List Patient Active Problem List   Diagnosis Date Noted  . Adhesive capsulitis of left shoulder 01/18/2016  . Adhesive capsulitis of right shoulder 03/24/2011  . Retinal detachment 12/06/2010  . Rotator cuff syndrome 12/06/2010  . History of pancreatitis 10/22/2010  . Hypertriglyceridemia 10/22/2010  . Diabetes mellitus type II, uncontrolled (HCC) 10/22/2010    Carmino Ocain C. Payge Eppes PT, DPT 05/13/16 8:45 AM   [facility] [address] [place], [place], [address] Phone: [phone]   Fax:  [phone]  Name: Erik SoloSamuel B Sousa MRN: [id number] Date of Birth: 27-Aug-1965

## 2016-05-15 ENCOUNTER — Ambulatory Visit: Payer: BLUE CROSS/BLUE SHIELD | Admitting: Physical Therapy

## 2016-05-15 DIAGNOSIS — M25512 Pain in left shoulder: Secondary | ICD-10-CM

## 2016-05-15 DIAGNOSIS — M25612 Stiffness of left shoulder, not elsewhere classified: Secondary | ICD-10-CM

## 2016-05-15 NOTE — Therapy (Signed)
[facility] [address] [place], [place], [address] Phone: [phone]   Fax:  [phone]  Physical Therapy Treatment  Patient Details  Name: [name] MRN: [id number] Date of Birth: 1966/03/30 Referring Provider: Nestor RampSara L Neal, MD  Encounter Date: 05/15/2016      PT End of Session - 05/15/16 0931    Visit Number 5   Number of Visits 17   Date for PT Re-Evaluation 06/13/16   Authorization Type BCBS 30 visit limit   PT Start Time 0931   PT Stop Time 1028   PT Time Calculation (min) 57 min   Activity Tolerance Patient tolerated treatment well   Behavior During Therapy [facility] for tasks assessed/performed      Past Medical History:  Diagnosis Date  . ADHD (attention deficit hyperactivity disorder)   . Anxiety   . Anxiety and depression   . Depression   . Diabetes type 2, controlled (HCC)   . Diverticulosis   . Hyperlipidemia   . Kidney stones   . Pancreatitis   . Stomach ulcer     Past Surgical History:  Procedure Laterality Date  . ANKLE SURGERY    . CERVICAL DISC SURGERY     C4-C5  . ELBOW SURGERY    . RETINAL DETACHMENT SURGERY    . RHINOPLASTY    . VASECTOMY      There were no vitals filed for this visit.      Subjective Assessment - 05/15/16 0932    Subjective I was not too sore.  I could tell a difference when I helped my sister move some boxes and It felt more loose   Currently in Pain? Yes   Pain Score 1    Pain Location Shoulder   Pain Orientation Left   Pain Descriptors / Indicators Tightness            [facility] PT Assessment - 05/15/16 0938      AROM   Left Shoulder Flexion 142 Degrees  post needling   Left Shoulder ABduction 120 Degrees  post TDN   Left Shoulder Internal Rotation 57 Degrees  90 degree abd supine                     [facility] Adult PT Treatment/Exercise - 05/15/16 0934      Shoulder Exercises: Supine   Other Supine Exercises pt demo wand exercisie  for flex, abd, IR/ER x 10 each     Shoulder Exercises: Stretch   Other Shoulder Stretches use of towel for added IR stretch.      Moist Heat Therapy   Number Minutes Moist Heat 15 Minutes   Moist Heat Location Other (comment);Cervical  tspine in prone     Manual Therapy   Manual Therapy Manual Traction;Joint mobilization;Myofascial release   Joint Mobilization shoulder IR/ER, Flex abd grade 3 mobs to GHJ   Soft tissue mobilization left upper trap and levator sub scapular and rhomboids   Myofascial Release lat, subscap, cervical upper trap and levator          Trigger Point Dry Needling - 05/15/16 0941    Consent Given? Yes   Education Handout Provided No  preciously given   Muscles Treated Upper Body Upper trapezius;Levator scapulae;Subscapularis;Rhomboids  latissimus, deltoid AMP all on left side TDN   Upper Trapezius Response Twitch reponse elicited;Palpable increased muscle length   Levator Scapulae Response Palpable increased muscle length   Rhomboids Response Twitch response elicited;Palpable increased muscle length  Subscapularis Response Twitch response elicited;Palpable increased muscle length                PT Short Term Goals - 05/15/16 0936      PT SHORT TERM GOAL #1   Title Increase in flexion and abduction ROM acitvely by 10 deg by 12/22   Baseline see flowsheet  flex 142, abd 120 and IR 57   Time 3   Period Weeks   Status Achieved     PT SHORT TERM GOAL #2   Title Independent in HEP as it has been established    Baseline able to perform HEP, tries to do red t band several times a day on computer   Time 3   Period Weeks   Status Achieved           PT Long Term Goals - 04/30/16 1551      PT LONG TERM GOAL #1   Title Pt will be able to reach behind back to at least T10 to demo improved functional ROM by 06/13/16   Baseline L3 at eval   Time 6   Period Weeks   Status New     PT LONG TERM GOAL #2   Title Pt will be able to don jacket  with minimal discomfort in shoulder   Baseline verbalized 5/10 pain at eval   Time 6   Period Weeks     PT LONG TERM GOAL #3   Title FOTO to 71% ability to indicate significant functional improvment.    Baseline 58% at eval   Time 6   Period Weeks   Status New     PT LONG TERM GOAL #4   Title Pt will demo MMT 5/5 to indicate necessary stability provided by [facility] and periscapular musculature   Baseline see flowsheet   Time 6   Period Weeks   Status New               Plan - 05/15/16 1014    Clinical Impression Statement Pt with more looser left shoulder post TDN,  Increase in AROM in all planes and decreased tightness and pain on palpation.  Pt instructed on self IR with towel with added stretch for AROM shoulder measurement documented. All STG's achieved this visit   Rehab Potential Good   PT Frequency 3x / week   PT Duration 6 weeks   PT Treatment/Interventions ADLs/Self Care Home Management;Cryotherapy;Electrical Stimulation;Iontophoresis 4mg /ml Dexamethasone;Functional mobility training;Ultrasound;Moist Heat;Therapeutic activities;Therapeutic exercise;Neuromuscular re-education;Patient/family education;Passive range of motion;Manual techniques;Dry needling;Taping   PT Next Visit Plan DN PRN cervical and GHJ, lat dorsi. as needed   PT Home Exercise Plan supine flx with wand, sleeper stretch, scapular retraction/postural awareness, child pose, seated ER red tband every hour while on computer, Upper trap andlevator stretch on left. towel stretch with IR on left   Consulted and Agree with Plan of Care Patient      Patient will benefit from skilled therapeutic intervention in order to improve the following deficits and impairments:  Decreased range of motion, Impaired UE functional use, Increased muscle spasms, Decreased activity tolerance, Pain, Improper body mechanics, Decreased strength, Postural dysfunction  Visit Diagnosis: Left shoulder pain, unspecified  chronicity  Stiffness of left shoulder, not elsewhere classified     Problem List Patient Active Problem List   Diagnosis Date Noted  . Adhesive capsulitis of left shoulder 01/18/2016  . Adhesive capsulitis of right shoulder 03/24/2011  . Retinal detachment 12/06/2010  . Rotator cuff syndrome 12/06/2010  .  History of pancreatitis 10/22/2010  . Hypertriglyceridemia 10/22/2010  . Diabetes mellitus type II, uncontrolled (HCC) 10/22/2010    Garen LahLawrie Beardsley, PT Exercise Expert for the A[facility] Adult  05/15/16 12:22 PM Phone: [phone] Fax: [phone]  [facility] [address] [place], [place], [address] Phone: [phone]   Fax:  [phone]  Name: [name] MRN: [id number] Date of Birth: November 02, 1965

## 2016-05-19 ENCOUNTER — Ambulatory Visit: Payer: BLUE CROSS/BLUE SHIELD | Admitting: Physical Therapy

## 2016-05-20 ENCOUNTER — Encounter: Payer: Self-pay | Admitting: Physical Therapy

## 2016-05-20 ENCOUNTER — Ambulatory Visit: Payer: BLUE CROSS/BLUE SHIELD | Admitting: Physical Therapy

## 2016-05-20 DIAGNOSIS — M25512 Pain in left shoulder: Secondary | ICD-10-CM | POA: Diagnosis not present

## 2016-05-20 DIAGNOSIS — M25612 Stiffness of left shoulder, not elsewhere classified: Secondary | ICD-10-CM

## 2016-05-20 NOTE — Therapy (Signed)
[facility] [address] [place], [place], [address] Phone: [phone]   Fax:  [phone]  Physical Therapy Treatment  Patient Details  Name: [name] MRN: [id number] Date of Birth: [birth year] Referring Provider: Nestor RampSara L Neal, MD  Encounter Date: 05/20/2016      PT End of Session - 05/20/16 0805    Visit Number 6   Number of Visits 17   Date for PT Re-Evaluation 06/13/16   Authorization Type BCBS 30 visit limit   PT Start Time 0804   PT Stop Time 0854   PT Time Calculation (min) 50 min   Activity Tolerance Patient tolerated treatment well   Behavior During Therapy [facility] for tasks assessed/performed      Past Medical History:  Diagnosis Date  . ADHD (attention deficit hyperactivity disorder)   . Anxiety   . Anxiety and depression   . Depression   . Diabetes type 2, controlled (HCC)   . Diverticulosis   . Hyperlipidemia   . Kidney stones   . Pancreatitis   . Stomach ulcer     Past Surgical History:  Procedure Laterality Date  . ANKLE SURGERY    . CERVICAL DISC SURGERY     C4-C5  . ELBOW SURGERY    . RETINAL DETACHMENT SURGERY    . RHINOPLASTY    . VASECTOMY      There were no vitals filed for this visit.      Subjective Assessment - 05/20/16 0806    Subjective Pt reports feeling a little stiffer yesterday, unknown reason. Most pain when reaching behind back. Still feels limited reaching OH, can reach ceiling with R arm but not left. Now has to don jacket with L arm first.    Currently in Pain? No/denies            [facility] Assessment - 05/20/16 0001      AROM   Left Shoulder Flexion 120 Degrees   Left Shoulder ABduction 115 Degrees   Left Shoulder Internal Rotation --  thumb to L2                     [facility] Adult PT Treatment/Exercise - 05/20/16 0001      Shoulder Exercises: Supine   Other Supine Exercises thoracic ext over sheet rolled     Shoulder Exercises: Prone    Retraction 15 reps   Retraction Weight (lbs) 3   Retraction Limitations 3s holds, slow lower   Flexion 15 reps   Horizontal ABduction 1 15 reps  thumb out to the side     Shoulder Exercises: Sidelying   Other Sidelying Exercises PT resisted scapular protraction/retraction     Shoulder Exercises: Pulleys   Flexion 2 minutes   Flexion Limitations PT blocked scapular movement   ABduction 2 minutes     Shoulder Exercises: ROM/Strengthening   UBE (Upper Arm Bike) 3'/3' L2     Moist Heat Therapy   Number Minutes Moist Heat 10 Minutes   Moist Heat Location Other (comment)  tspine in supine     Manual Therapy   Joint Mobilization prone rib mobs (L upper tspine), Tspine PAs, scapular distraction in sidelying                PT Education - 05/20/16 0809    Education provided Yes   Education Details exercise form/rationale   Person(s) Educated Patient   Methods Explanation;Demonstration;Tactile cues;Verbal cues   Comprehension Verbalized understanding;Returned demonstration;Verbal cues required;Tactile cues  required;Need further instruction          PT Short Term Goals - 05/15/16 0936      PT SHORT TERM GOAL #1   Title Increase in flexion and abduction ROM acitvely by 10 deg by 12/22   Baseline see flowsheet  flex 142, abd 120 and IR 57   Time 3   Period Weeks   Status Achieved     PT SHORT TERM GOAL #2   Title Independent in HEP as it has been established    Baseline able to perform HEP, tries to do red t band several times a day on computer   Time 3   Period Weeks   Status Achieved           PT Long Term Goals - 04/30/16 1551      PT LONG TERM GOAL #1   Title Pt will be able to reach behind back to at least T10 to demo improved functional ROM by 06/13/16   Baseline L3 at eval   Time 6   Period Weeks   Status New     PT LONG TERM GOAL #2   Title Pt will be able to don jacket with minimal discomfort in shoulder   Baseline verbalized 5/10 pain at  eval   Time 6   Period Weeks     PT LONG TERM GOAL #3   Title FOTO to 71% ability to indicate significant functional improvment.    Baseline 58% at eval   Time 6   Period Weeks   Status New     PT LONG TERM GOAL #4   Title Pt will demo MMT 5/5 to indicate necessary stability provided by [facility] and periscapular musculature   Baseline see flowsheet   Time 6   Period Weeks   Status New               Plan - 05/20/16 0846    Clinical Impression Statement Significant limitation in L rib mobility, particularly T3-7, which resulted in significant discomfort. Was able to improve mobility through manual treatment.    PT Home Exercise Plan supine flx with wand, sleeper stretch, scapular retraction/postural awareness, child pose, seated ER red tband every hour while on computer, Upper trap andlevator stretch on left. towel stretch with IR on left; thoracic ext over pillows   Consulted and Agree with Plan of Care Patient      Patient will benefit from skilled therapeutic intervention in order to improve the following deficits and impairments:     Visit Diagnosis: Left shoulder pain, unspecified chronicity  Stiffness of left shoulder, not elsewhere classified     Problem List Patient Active Problem List   Diagnosis Date Noted  . Adhesive capsulitis of left shoulder 01/18/2016  . Adhesive capsulitis of right shoulder 03/24/2011  . Retinal detachment 12/06/2010  . Rotator cuff syndrome 12/06/2010  . History of pancreatitis 10/22/2010  . Hypertriglyceridemia 10/22/2010  . Diabetes mellitus type II, uncontrolled (HCC) 10/22/2010   Riddik Senna C. Casara Perrier PT, DPT 05/20/16 8:49 AM    [facility] [address] [place], [place], [address] Phone: [phone]   Fax:  [phone]  Name: Erik SoloSamuel B [name] MRN: [id number] Date of Birth: 1965-09-13

## 2016-05-22 ENCOUNTER — Ambulatory Visit: Payer: BLUE CROSS/BLUE SHIELD | Admitting: Physical Therapy

## 2016-05-22 DIAGNOSIS — M25612 Stiffness of left shoulder, not elsewhere classified: Secondary | ICD-10-CM

## 2016-05-22 DIAGNOSIS — M25512 Pain in left shoulder: Secondary | ICD-10-CM

## 2016-05-22 NOTE — Therapy (Signed)
[facility] [address] [place], [place], [address] Phone: [phone]   Fax:  [phone]  Physical Therapy Treatment  Patient Details  Name: [name] MRN: [id number] Date of Birth: 1966/05/01 Referring Provider: Nestor RampSara L Neal, MD  Encounter Date: 05/22/2016      PT End of Session - 05/22/16 1326    Visit Number 7   Number of Visits 17   Date for PT Re-Evaluation 06/13/16   PT Start Time 0931   PT Stop Time 1030   PT Time Calculation (min) 59 min   Activity Tolerance Patient tolerated treatment well   Behavior During Therapy [facility] for tasks assessed/performed      Past Medical History:  Diagnosis Date  . ADHD (attention deficit hyperactivity disorder)   . Anxiety   . Anxiety and depression   . Depression   . Diabetes type 2, controlled (HCC)   . Diverticulosis   . Hyperlipidemia   . Kidney stones   . Pancreatitis   . Stomach ulcer     Past Surgical History:  Procedure Laterality Date  . ANKLE SURGERY    . CERVICAL DISC SURGERY     C4-C5  . ELBOW SURGERY    . RETINAL DETACHMENT SURGERY    . RHINOPLASTY    . VASECTOMY      There were no vitals filed for this visit.      Subjective Assessment - 05/22/16 0936    Subjective "I tink the shoulder is definitley getting better, the TPDN has really helped"    Currently in Pain? Yes   Pain Score 0-No pain   Pain Descriptors / Indicators Tightness   Aggravating Factors  reaching behind th eback"    Pain Relieving Factors resting, getting out of positions.                          [facility] Adult PT Treatment/Exercise - 05/22/16 0001      Shoulder Exercises: Stretch   Other Shoulder Stretches use of towel for added IR stretch.      Moist Heat Therapy   Number Minutes Moist Heat 10 Minutes   Moist Heat Location Other (comment)     Manual Therapy   Joint Mobilization prone rib mobs (L upper tspine), Tspine PAs, scapular distraction in  sidelying, anterior GHJ mobs (with pt in prone)   Soft tissue mobilization IASTM over the upper trap, pec major and minor/ anterior deltoid           Trigger Point Dry Needling - 05/22/16 1016    Consent Given? Yes   Education Handout Provided Yes   Muscles Treated Upper Body Pectoralis major;Pectoralis minor  latissumus doris, and teres major/ minor, sub-Clavius   Upper Trapezius Response Twitch reponse elicited;Palpable increased muscle length  L only   Pectoralis Major Response Twitch response elicited;Palpable increased muscle length   Pectoralis Minor Response Twitch response elicited;Palpable increased muscle length   Levator Scapulae Response --   Rhomboids Response Twitch response elicited;Palpable increased muscle length   Subscapularis Response Twitch response elicited;Palpable increased muscle length                PT Short Term Goals - 05/15/16 0936      PT SHORT TERM GOAL #1   Title Increase in flexion and abduction ROM acitvely by 10 deg by 12/22   Baseline see flowsheet  flex 142, abd 120 and IR 57   Time 3  Period Weeks   Status Achieved     PT SHORT TERM GOAL #2   Title Independent in HEP as it has been established    Baseline able to perform HEP, tries to do red t band several times a day on computer   Time 3   Period Weeks   Status Achieved           PT Long Term Goals - 04/30/16 1551      PT LONG TERM GOAL #1   Title Pt will be able to reach behind back to at least T10 to demo improved functional ROM by 06/13/16   Baseline L3 at eval   Time 6   Period Weeks   Status New     PT LONG TERM GOAL #2   Title Pt will be able to don jacket with minimal discomfort in shoulder   Baseline verbalized 5/10 pain at eval   Time 6   Period Weeks     PT LONG TERM GOAL #3   Title FOTO to 71% ability to indicate significant functional improvment.    Baseline 58% at eval   Time 6   Period Weeks   Status New     PT LONG TERM GOAL #4   Title Pt  will demo MMT 5/5 to indicate necessary stability provided by [facility] and periscapular musculature   Baseline see flowsheet   Time 6   Period Weeks   Status New             Patient will benefit from skilled therapeutic intervention in order to improve the following deficits and impairments:     Visit Diagnosis: Left shoulder pain, unspecified chronicity  Stiffness of left shoulder, not elsewhere classified     Problem List Patient Active Problem List   Diagnosis Date Noted  . Adhesive capsulitis of left shoulder 01/18/2016  . Adhesive capsulitis of right shoulder 03/24/2011  . Retinal detachment 12/06/2010  . Rotator cuff syndrome 12/06/2010  . History of pancreatitis 10/22/2010  . Hypertriglyceridemia 10/22/2010  . Diabetes mellitus type II, uncontrolled (HCC) 10/22/2010   Lulu RidingKristoffer Leyan Branden PT, DPT, LAT, ATC  05/22/16  1:28 PM      [facility] [address] [place], [place], [address] Phone: [phone]   Fax:  [phone]  Name: [name] MRN: [id number] Date of Birth: 1966-03-05

## 2016-05-27 ENCOUNTER — Encounter: Payer: Self-pay | Admitting: Physical Therapy

## 2016-05-27 ENCOUNTER — Ambulatory Visit: Payer: BLUE CROSS/BLUE SHIELD | Admitting: Physical Therapy

## 2016-05-27 DIAGNOSIS — M25512 Pain in left shoulder: Secondary | ICD-10-CM | POA: Diagnosis not present

## 2016-05-27 DIAGNOSIS — M25612 Stiffness of left shoulder, not elsewhere classified: Secondary | ICD-10-CM

## 2016-05-27 NOTE — Therapy (Signed)
[facility] [address] [place], [place], [address] Phone: [phone]   Fax:  [phone]  Physical Therapy Treatment  Patient Details  Name: [name] MRN: [id number] Date of Birth: 08-Dec-1965 Referring Provider: Nestor RampSara L Neal, MD  Encounter Date: 05/27/2016      PT End of Session - 05/27/16 0933    Visit Number 8   Number of Visits 17   Date for PT Re-Evaluation 06/13/16   Authorization Type BC[employer] 30 visit limit   PT Start Time 0933   PT Stop Time 1022   PT Time Calculation (min) 49 min   Activity Tolerance Patient tolerated treatment well   Behavior During Therapy [facility] for tasks assessed/performed      Past Medical History:  Diagnosis Date  . ADHD (attention deficit hyperactivity disorder)   . Anxiety   . Anxiety and depression   . Depression   . Diabetes type 2, controlled (HCC)   . Diverticulosis   . Hyperlipidemia   . Kidney stones   . Pancreatitis   . Stomach ulcer     Past Surgical History:  Procedure Laterality Date  . ANKLE SURGERY    . CERVICAL DISC SURGERY     C4-C5  . ELBOW SURGERY    . RETINAL DETACHMENT SURGERY    . RHINOPLASTY    . VASECTOMY      There were no vitals filed for this visit.      Subjective Assessment - 05/27/16 0933    Subjective Reports shoulder was a little sore after the last visit, mostly on anterior aspect. Feels range is improving but still hurts coming benhind back   Currently in Pain? No/denies                         [facility] PT Treatment/Exercise - 05/27/16 0001      Shoulder Exercises: Supine   Flexion 10 reps  with wand   Flexion Limitations scaption lift from 90 deg x20     Shoulder Exercises: Seated   External Rotation 20 reps   Theraband Level (Shoulder External Rotation) Level 1 (Yellow)   External Rotation Limitations at 90/90     Shoulder Exercises: Prone   Extension 15 reps  ext into approx 45 deg abd, back to ext  and lower   External Rotation 10 reps  10s holds   External Rotation Limitations prone on elbows iso ER with towel   Internal Rotation 15 reps   Internal Rotation Limitations ext, to low back, ext lower   Other Prone Exercises prone extension holding towel     Shoulder Exercises: Sidelying   Internal Rotation Limitations with towel roll under arm     Shoulder Exercises: ROM/Strengthening   UBE (Upper Arm Bike) retro 5 min L2     Moist Heat Therapy   Number Minutes Moist Heat 10 Minutes   Moist Heat Location Shoulder  L     Manual Therapy   Joint Mobilization end range flx and abd post/inf gr 4   Myofascial Release anterior deltoid                PT Education - 05/27/16 0935    Education provided Yes   Education Details exercise form/rationale, HEP   Person(s) Educated Patient   Methods Explanation;Demonstration;Tactile cues;Verbal cues   Comprehension Verbalized understanding;Returned demonstration;Verbal cues required;Tactile cues required;Need further instruction          PT Short Term Goals -  05/15/16 0936      PT SHORT TERM GOAL #1   Title Increase in flexion and abduction ROM acitvely by 10 deg by 12/22   Baseline see flowsheet  flex 142, abd 120 and IR 57   Time 3   Period Weeks   Status Achieved     PT SHORT TERM GOAL #2   Title Independent in HEP as it has been established    Baseline able to perform HEP, tries to do red t band several times a day on computer   Time 3   Period Weeks   Status Achieved           PT Long Term Goals - 04/30/16 1551      PT LONG TERM GOAL #1   Title Pt will be able to reach behind back to at least T10 to demo improved functional ROM by 06/13/16   Baseline L3 at eval   Time 6   Period Weeks   Status New     PT LONG TERM GOAL #2   Title Pt will be able to don jacket with minimal discomfort in shoulder   Baseline verbalized 5/10 pain at eval   Time 6   Period Weeks     PT LONG TERM GOAL #3   Title FOTO  to 71% ability to indicate significant functional improvment.    Baseline 58% at eval   Time 6   Period Weeks   Status New     PT LONG TERM GOAL #4   Title Pt will demo MMT 5/5 to indicate necessary stability provided by [facility] and periscapular musculature   Baseline see flowsheet   Time 6   Period Weeks   Status New               Plan - 05/27/16 1008    Clinical Impression Statement anterior shoulder pain with end range flexion, abd and IR. unable to reach to midline in IR prone, against gravity, but is able to reach past midline in standing and sidelying.    PT Next Visit Plan check cervical mobility   PT Home Exercise Plan supine flx with wand, sleeper stretch, scapular retraction/postural awareness, child pose, seated ER red tband every hour while on computer, Upper trap andlevator stretch on left. towel stretch with IR on left; thoracic ext over pillows   Consulted and Agree with Plan of Care Patient      Patient will benefit from skilled therapeutic intervention in order to improve the following deficits and impairments:     Visit Diagnosis: Left shoulder pain, unspecified chronicity  Stiffness of left shoulder, not elsewhere classified     Problem List Patient Active Problem List   Diagnosis Date Noted  . Adhesive capsulitis of left shoulder 01/18/2016  . Adhesive capsulitis of right shoulder 03/24/2011  . Retinal detachment 12/06/2010  . Rotator cuff syndrome 12/06/2010  . History of pancreatitis 10/22/2010  . Hypertriglyceridemia 10/22/2010  . Diabetes mellitus type II, uncontrolled (HCC) 10/22/2010    Ramatoulaye Pack C. Shanan Mcmiller PT, DPT 05/27/16 10:13 AM   [facility] [address] [place], [place], [address] Phone: [phone]   Fax:  [phone]  Name: [name] MRN: [id number] Date of Birth: [birth year]

## 2016-05-28 ENCOUNTER — Ambulatory Visit: Payer: BLUE CROSS/BLUE SHIELD | Admitting: Physical Therapy

## 2016-05-29 ENCOUNTER — Ambulatory Visit: Payer: BLUE CROSS/BLUE SHIELD | Admitting: Physical Therapy

## 2016-05-29 DIAGNOSIS — M25512 Pain in left shoulder: Secondary | ICD-10-CM | POA: Diagnosis not present

## 2016-05-29 DIAGNOSIS — M25612 Stiffness of left shoulder, not elsewhere classified: Secondary | ICD-10-CM

## 2016-05-29 NOTE — Therapy (Signed)
[facility] [address] [place], [place], [address] Phone: [phone]   Fax:  [phone]  Physical Therapy Treatment  Patient Details  Name: [name] MRN: [id number] Date of Birth: 1965-12-29 Referring Provider: Nestor RampSara L Neal, MD  Encounter Date: 05/29/2016      PT End of Session - 05/29/16 0930    Visit Number 9   Number of Visits 17   Date for PT Re-Evaluation 06/13/16   Authorization Type [employer] 30 visit limit   PT Start Time 0930   PT Stop Time 1028   PT Time Calculation (min) 58 min   Activity Tolerance Patient tolerated treatment well   Behavior During Therapy [facility] for tasks assessed/performed      Past Medical History:  Diagnosis Date  . ADHD (attention deficit hyperactivity disorder)   . Anxiety   . Anxiety and depression   . Depression   . Diabetes type 2, controlled (HCC)   . Diverticulosis   . Hyperlipidemia   . Kidney stones   . Pancreatitis   . Stomach ulcer     Past Surgical History:  Procedure Laterality Date  . ANKLE SURGERY    . CERVICAL DISC SURGERY     C4-C5  . ELBOW SURGERY    . RETINAL DETACHMENT SURGERY    . RHINOPLASTY    . VASECTOMY      There were no vitals filed for this visit.      Subjective Assessment - 05/29/16 0937    Subjective reports that he was a little more sore with deep dry needling initially but it felt better the next day.   Currently in Pain? No/denies  tightness only on left shoulder            [facility] PT Assessment - 05/29/16 0941      AROM   Left Shoulder Flexion 135 Degrees   Left Shoulder ABduction 119 Degrees   Left Shoulder Internal Rotation --  thumb to T-12                     [facility] Adult PT Treatment/Exercise - 05/29/16 0939      Shoulder Exercises: Supine   Flexion Limitations scaption lift from 90 deg x20     Shoulder Exercises: Seated   External Rotation 20 reps;Theraband;Both   Abduction  Left;Theraband;Strengthening   Theraband Level (Shoulder ABduction) Level 3 (Green)     Moist Heat Therapy   Number Minutes Moist Heat 15 Minutes   Moist Heat Location Shoulder;Cervical  left side only     Manual Therapy   Manual Therapy Joint mobilization;Soft tissue mobilization;Myofascial release   Manual therapy comments contract relax for subscapularis hold 20 sec x 3   Joint Mobilization end range flx and abd post/inf gr 4   Soft tissue mobilization left cervical, levator, sub scalpularis and pecs   Myofascial Release cervical/ levator/Upper trap left side only          Trigger Point Dry Needling - 05/29/16 0943    Consent Given? Yes   Education Handout Provided --  previously given   Muscles Treated Upper Body Pectoralis major;Pectoralis minor;Rhomboids;Subscapularis;Upper trapezius;Levator scapulae  lats, teres major/minor , subclavius palpable lengthening   Upper Trapezius Response Twitch reponse elicited  left side marked response   Pectoralis Major Response Twitch response elicited   Pectoralis Minor Response Twitch response elicited   Levator Scapulae Response Twitch response elicited   Rhomboids Response Twitch response elicited   Subscapularis Response Twitch  response elicited  lateral and medial                PT Short Term Goals - 05/15/16 0936      PT SHORT TERM GOAL #1   Title Increase in flexion and abduction ROM acitvely by 10 deg by 12/22   Baseline see flowsheet  flex 142, abd 120 and IR 57   Time 3   Period Weeks   Status Achieved     PT SHORT TERM GOAL #2   Title Independent in HEP as it has been established    Baseline able to perform HEP, tries to do red t band several times a day on computer   Time 3   Period Weeks   Status Achieved           PT Long Term Goals - 05/29/16 1456      PT LONG TERM GOAL #1   Title Pt will be able to reach behind back to at least T10 to demo improved functional ROM by 06/13/16   Baseline  Reaches to T-12   Period Weeks   Status On-going     PT LONG TERM GOAL #2   Title Pt will be able to don jacket with minimal discomfort in shoulder   Baseline decreased to 2/10   Time 6   Period Weeks   Status On-going     PT LONG TERM GOAL #3   Title FOTO to 71% ability to indicate significant functional improvment.    Time 6   Period Weeks   Status Unable to assess     PT LONG TERM GOAL #4   Title Pt will demo MMT 5/5 to indicate necessary stability provided by [facility] and periscapular musculature   Baseline see flowsheet   Time 6   Period Weeks   Status On-going               Plan - 05/29/16 1457    Clinical Impression Statement Pt does not complain of pain with end range today, Pt is able to reach midline after RX today in prone and after mobization,  Pt stated he had good trigger point dry needling that went deeply last visit and he seemed to improve motion.  Pt consented to trigger point dry needling today and tolerated well with good effect.  Will continue and check goals next visit.    Rehab Potential Good   PT Frequency 3x / week   PT Duration 6 weeks   PT Treatment/Interventions ADLs/Self Care Home Management;Cryotherapy;Electrical Stimulation;Iontophoresis 4mg /ml Dexamethasone;Functional mobility training;Ultrasound;Moist Heat;Therapeutic activities;Therapeutic exercise;Neuromuscular re-education;Patient/family education;Passive range of motion;Manual techniques;Dry needling;Taping   PT Next Visit Plan check cervical mobility next visit and check goals   PT Home Exercise Plan supine flx with wand, sleeper stretch, scapular retraction/postural awareness, child pose, seated ER red tband every hour while on computer, Upper trap andlevator stretch on left. towel stretch with IR on left; thoracic ext over pillows   Consulted and Agree with Plan of Care Patient      Patient will benefit from skilled therapeutic intervention in order to improve the following deficits and  impairments:  Decreased range of motion, Impaired UE functional use, Increased muscle spasms, Decreased activity tolerance, Pain, Improper body mechanics, Decreased strength, Postural dysfunction  Visit Diagnosis: Left shoulder pain, unspecified chronicity  Stiffness of left shoulder, not elsewhere classified     Problem List Patient Active Problem List   Diagnosis Date Noted  . Adhesive capsulitis of left shoulder 01/18/2016  .  Adhesive capsulitis of right shoulder 03/24/2011  . Retinal detachment 12/06/2010  . Rotator cuff syndrome 12/06/2010  . History of pancreatitis 10/22/2010  . Hypertriglyceridemia 10/22/2010  . Diabetes mellitus type II, uncontrolled (HCC) 10/22/2010   Garen LahLawrie Beardsley, PT Exercise Expert for the Aging Adult  05/29/16 5:34 PM Phone: [phone] Fax: [phone]   [facility] [address] [place], [place], [address] Phone: [phone]   Fax:  [phone]  Name: [name] MRN: [id number] Date of Birth: 1966/02/28

## 2016-06-03 ENCOUNTER — Ambulatory Visit: Payer: BLUE CROSS/BLUE SHIELD | Attending: Family Medicine | Admitting: Physical Therapy

## 2016-06-03 DIAGNOSIS — M25512 Pain in left shoulder: Secondary | ICD-10-CM | POA: Insufficient documentation

## 2016-06-03 DIAGNOSIS — M25612 Stiffness of left shoulder, not elsewhere classified: Secondary | ICD-10-CM | POA: Insufficient documentation

## 2016-06-03 NOTE — Patient Instructions (Signed)
Flexibility: Corner Stretch    Standing in corner with hands just above shoulder level and feet __8__ inches from corner, lean forward until a comfortable stretch is felt across chest. Hold __30__ seconds. Repeat _3___ times per set. Do __1__ sets per session. Do _2-3___ sessions per day.  http://orth.exer.us/342   Copyright  VHI. All rights reserved.     Perform towel exercise as done in clinic and on handout given in clinic.  Perform   Garen LahLawrie Ranveer Wahlstrom, PT Exercise Expert for the Aging Adult  06/03/16 9:46 AM Phone: [phone] Fax: [phone]

## 2016-06-03 NOTE — Therapy (Addendum)
[facility] [address] [place], [place], [address] Phone: [phone]   Fax:  [phone]  Physical Therapy Treatment  Patient Details  Name: Erik SoloSamuel B Berlinger MRN: [id number] Date of Birth: Oct 09, 1965 Referring Provider: Nestor RampSara L Neal, MD  Encounter Date: 06/03/2016      PT End of Session - 06/03/16 0936    Visit Number 10   Number of Visits 17   Authorization Type BCBS 30 visit limit   PT Start Time 0930   PT Stop Time 1030   PT Time Calculation (min) 60 min   Activity Tolerance Patient tolerated treatment well   Behavior During Therapy [facility] for tasks assessed/performed      Past Medical History:  Diagnosis Date  . ADHD (attention deficit hyperactivity disorder)   . Anxiety   . Anxiety and depression   . Depression   . Diabetes type 2, controlled (HCC)   . Diverticulosis   . Hyperlipidemia   . Kidney stones   . Pancreatitis   . Stomach ulcer     Past Surgical History:  Procedure Laterality Date  . ANKLE SURGERY    . CERVICAL DISC SURGERY     C4-C5  . ELBOW SURGERY    . RETINAL DETACHMENT SURGERY    . RHINOPLASTY    . VASECTOMY      There were no vitals filed for this visit.      Subjective Assessment - 06/03/16 0939    Subjective reports no pain with sitting but I have a 1-2/10 discomfort/ tightness if I reach back for something or reach far above my head to reach for something   Currently in Pain? Yes   Pain Score 2   1-2 tightness   Pain Location Shoulder   Pain Orientation Left   Pain Descriptors / Indicators Tightness   Pain Type Chronic pain            [facility] PT Assessment - 06/03/16 0941      Observation/Other Assessments   Focus on Therapeutic Outcomes (FOTO)  FOTO intake ability 60% limitation 40% Eval 42%  predicted 29%     AROM   Right Shoulder Flexion 158 Degrees   Right Shoulder ABduction 155 Degrees   Right Shoulder Internal Rotation 47 Degrees  t-8 with thumb   Left  Shoulder Flexion 140 Degrees   Left Shoulder ABduction 125 Degrees   Left Shoulder Internal Rotation --  thumb to T-12     Strength   Left Shoulder Flexion 4+/5   Left Shoulder ABduction 4/5   Left Shoulder Internal Rotation 4/5   Left Shoulder External Rotation 4-/5                     [facility] Adult PT Treatment/Exercise - 06/03/16 1015      Shoulder Exercises: Standing   Other Standing Exercises attempted wall clock but pt not comfortable   Other Standing Exercises towel assist with IR on left x 3 x 30 sec,      Shoulder Exercises: Stretch   Corner Stretch 3 reps;30 seconds   Corner Stretch Limitations arms at 90 degree abduction     Moist Heat Therapy   Number Minutes Moist Heat 15 Minutes   Moist Heat Location Shoulder;Cervical  left side only     Manual Therapy   Manual Therapy Joint mobilization;Soft tissue mobilization;Myofascial release   Manual therapy comments contract relax for subscapularis hold 20 sec x 3   Joint Mobilization end range flx  and abd post/inf gr 4; standing IR lift  in back with assist jt mobs grade 3    Soft tissue mobilization left cervical, levator, sub scalpularis and pecs          Trigger Point Dry Needling - 06/03/16 1002    Consent Given? Yes   Education Handout Provided No  preciously given   Muscles Treated Upper Body Pectoralis major;Pectoralis minor;Rhomboids;Subscapularis;Supraspinatus;Infraspinatus;Levator scapulae;Upper trapezius  Deltoid, AMP, twitch Middle and Post  TDN left side only    Upper Trapezius Response Twitch reponse elicited;Palpable increased muscle length   Pectoralis Major Response Twitch response elicited   Pectoralis Minor Response Palpable increased muscle length   Levator Scapulae Response Twitch response elicited   Rhomboids Response Palpable increased muscle length   Supraspinatus Response Twitch response elicited;Palpable increased muscle length   Infraspinatus Response Twitch response  elicited;Palpable increased muscle length   Subscapularis Response Twitch response elicited;Palpable increased muscle length              PT Education - 06/03/16 1015    Education provided Yes   Education Details Added to HEP  corner stretch and towel IR   Person(s) Educated Patient   Methods Explanation;Demonstration;Tactile cues;Verbal cues;Handout   Comprehension Verbalized understanding;Returned demonstration;Verbal cues required          PT Short Term Goals - 05/15/16 0936      PT SHORT TERM GOAL #1   Title Increase in flexion and abduction ROM acitvely by 10 deg by 12/22   Baseline see flowsheet  flex 142, abd 120 and IR 57   Time 3   Period Weeks   Status Achieved     PT SHORT TERM GOAL #2   Title Independent in HEP as it has been established    Baseline able to perform HEP, tries to do red t band several times a day on computer   Time 3   Period Weeks   Status Achieved           PT Long Term Goals - 06/03/16 1610      PT LONG TERM GOAL #1   Title Pt will be able to reach behind back to at least T10 to demo improved functional ROM by 06/13/16   Baseline Reaches to T-12 on left.  right about to reach T-8 with thumb   Time 6   Period Weeks   Status Achieved     PT LONG TERM GOAL #2   Title Pt will be able to don jacket with minimal discomfort in shoulder   Time 6   Period Weeks     PT LONG TERM GOAL #3   Title FOTO to 71% ability to indicate significant functional improvment.    Baseline 60% ability today   58% on eval    Time 6   Period Weeks   Status On-going               Plan - 06/03/16 1131    Clinical Impression Statement Pt does not have pain as much as chronic tightness 1-2/10 according to pt.  Pt feels trigger point dryneedling is beneficial.  Pt was told that dry needling is helpful but he will need to be able to control his tightness with exercises and stretching.  Pt to see [facility]ossa for  next 2 visits to finalize HEP.  AROM Right shoulder flex/ abd IR  158/155,  IR T-8,  LEft Shoulder flex/abd  IR, 140/ 125  T-12.  Pt has improved AROM  but still complains of residual tightness.   He is able to be dry needling one additional time before discharge on 06-12-16. before DC. FOTO limitation 40% today. Eval 42% predicted 29%   Rehab Potential Good   PT Frequency 3x / week   PT Duration 6 weeks   PT Treatment/Interventions ADLs/Self Care Home Management;Cryotherapy;Electrical Stimulation;Iontophoresis 4mg /ml Dexamethasone;Functional mobility training;Ultrasound;Moist Heat;Therapeutic activities;Therapeutic exercise;Neuromuscular re-education;Patient/family education;Passive range of motion;Manual techniques;Dry needling;Taping   PT Next Visit Plan check cervical mobility next visit and check goals   PT Home Exercise Plan supine flx with wand, sleeper stretch, scapular retraction/postural awareness, child pose, seated ER red tband every hour while on computer, Upper trap andlevator stretch on left. towel stretch with IR on left; thoracic ext over pillows, corner stretch, IR towel assisted stretch.   Consulted and Agree with Plan of Care Patient      Patient will benefit from skilled therapeutic intervention in order to improve the following deficits and impairments:  Decreased range of motion, Impaired UE functional use, Increased muscle spasms, Decreased activity tolerance, Pain, Improper body mechanics, Decreased strength, Postural dysfunction  Visit Diagnosis: Left shoulder pain, unspecified chronicity  Stiffness of left shoulder, not elsewhere classified     Problem List Patient Active Problem List   Diagnosis Date Noted  . Adhesive capsulitis of left shoulder 01/18/2016  . Adhesive capsulitis of right shoulder 03/24/2011  . Retinal detachment 12/06/2010  . Rotator cuff syndrome 12/06/2010  . History of pancreatitis 10/22/2010  . Hypertriglyceridemia 10/22/2010  . Diabetes mellitus type II, uncontrolled  (HCC) 10/22/2010    Garen Lah, PT Exercise Expert for the Aging Adult  06/03/16 12:07 PM Phone: [phone] Fax: [phone]  [facility] [address] [place], [place], [address] Phone: [phone]   Fax:  [phone]  Name: EMILE KYLLO MRN: [id number] Date of Birth: 10-13-1965

## 2016-06-03 NOTE — Patient Instructions (Addendum)
  Flexibility: Corner Stretch    Standing in corner with hands just above shoulder level and feet __8__ inches from corner, lean forward until a comfortable stretch is felt across chest. Hold __30__ seconds. Repeat _3___ times per set. Do __1__ sets per session. Do _2-3___ sessions per day.  http://orth.exer.us/342   Copyright  VHI. All rights reserved.     Perform towel exercise as done in clinic and on handout given in clinic.    Garen LahLawrie Jeison Delpilar, PT Exercise Expert for the Aging Adult  06/03/16 9:46 AM Phone: [phone] Fax: [phone]

## 2016-06-04 ENCOUNTER — Ambulatory Visit: Payer: BLUE CROSS/BLUE SHIELD | Admitting: Physical Therapy

## 2016-06-04 ENCOUNTER — Encounter: Payer: Self-pay | Admitting: Physical Therapy

## 2016-06-04 DIAGNOSIS — M25612 Stiffness of left shoulder, not elsewhere classified: Secondary | ICD-10-CM

## 2016-06-04 DIAGNOSIS — M25512 Pain in left shoulder: Secondary | ICD-10-CM | POA: Diagnosis not present

## 2016-06-04 NOTE — Therapy (Signed)
[facility] [address] [place], [place], [address] Phone: [phone]   Fax:  [phone]  Physical Therapy Treatment  Patient Details  Name: [name] MRN: [id number] Date of Birth: 1965/09/23 Referring Provider: Nestor RampSara L Neal, MD  Encounter Date: 06/04/2016      PT End of Session - 06/04/16 0803    Visit Number 11   Number of Visits 17   Date for PT Re-Evaluation 06/13/16   Authorization Type BCBS 30 visit limit   PT Start Time 0803   PT Stop Time 0900   PT Time Calculation (min) 57 min   Activity Tolerance Patient tolerated treatment well   Behavior During Therapy [facility] for tasks assessed/performed      Past Medical History:  Diagnosis Date  . ADHD (attention deficit hyperactivity disorder)   . Anxiety   . Anxiety and depression   . Depression   . Diabetes type 2, controlled (HCC)   . Diverticulosis   . Hyperlipidemia   . Kidney stones   . Pancreatitis   . Stomach ulcer     Past Surgical History:  Procedure Laterality Date  . ANKLE SURGERY    . CERVICAL DISC SURGERY     C4-C5  . ELBOW SURGERY    . RETINAL DETACHMENT SURGERY    . RHINOPLASTY    . VASECTOMY      There were no vitals filed for this visit.      Subjective Assessment - 06/04/16 0807    Subjective R anterior shoulder presents with pain in IR and with lift off from back. No pain at rest, 2/10 with hand behind back.    Currently in Pain? No/denies            [facility] - 06/03/16 0941      Observation/Other Assessments   Focus on Therapeutic Outcomes (FOTO)  FOTO intake 60% limitation 40% Eval 42%  predicted 29%     AROM   Right Shoulder Flexion 158 Degrees   Right Shoulder ABduction 155 Degrees   Right Shoulder Internal Rotation 47 Degrees  t-8 with thumb   Left Shoulder Flexion 140 Degrees   Left Shoulder ABduction 125 Degrees   Left Shoulder Internal Rotation --  thumb to T-12     Strength   Left Shoulder  Flexion 4+/5   Left Shoulder ABduction 4/5   Left Shoulder Internal Rotation 4/5   Left Shoulder External Rotation 4-/5                     [facility] Adult PT Treatment/Exercise - 06/04/16 0001      Shoulder Exercises: Supine   Horizontal ABduction 20 reps   Theraband Level (Shoulder Horizontal ABduction) Level 3 (Green)   Horizontal ABduction Limitations diagonals     Shoulder Exercises: Prone   Flexion 15 reps   Flexion Weight (lbs) 1   Internal Rotation Limitations IR with lift off from low lumbar   Horizontal ABduction 1 15 reps   Horizontal ABduction 1 Weight (lbs) 1   Horizontal ABduction 1 Limitations thumb up   Horizontal ABduction 2 15 reps   Horizontal ABduction 2 Weight (lbs) 1   Horizontal ABduction 2 Limitations palm down   Other Prone Exercises row to triceps kick 1lb     Shoulder Exercises: Sidelying   External Rotation 20 reps   External Rotation Weight (lbs) 2   External Rotation Limitations towel under elbow     Shoulder Exercises: ROM/Strengthening  UBE (Upper Arm Bike) 3'/3' L2     Shoulder Exercises: Stretch   Table Stretch - ABduction Limitations open book stretch   Other Shoulder Stretches IR behind back with strap   Other Shoulder Stretches review of HEP stretches     Moist Heat Therapy   Number Minutes Moist Heat 10 Minutes   Moist Heat Location Shoulder  concurrent with education          Trigger Point Dry Needling - 06/03/16 1002    Consent Given? Yes   Education Handout Provided No  preciously given   Muscles Treated Upper Body Pectoralis major;Pectoralis minor;Rhomboids;Subscapularis;Supraspinatus;Infraspinatus;Levator scapulae;Upper trapezius  Deltoid, AMP, twitch Middle and Post  TDN left side only    Upper Trapezius Response Twitch reponse elicited;Palpable increased muscle length   Pectoralis Major Response Twitch response elicited   Pectoralis Minor Response Palpable increased muscle length   Levator Scapulae Response  Twitch response elicited   Rhomboids Response Palpable increased muscle length   Supraspinatus Response Twitch response elicited;Palpable increased muscle length   Infraspinatus Response Twitch response elicited;Palpable increased muscle length   Subscapularis Response Twitch response elicited;Palpable increased muscle length              PT Education - 06/04/16 0901    Education provided Yes   Education Details exercise form/rationale, HEP, return to gym, limits on stretching/pain   Person(s) Educated Patient   Methods Explanation;Demonstration;Tactile cues;Verbal cues   Comprehension Verbalized understanding;Returned demonstration;Verbal cues required;Tactile cues required;Need further instruction          PT Short Term Goals - 05/15/16 0936      PT SHORT TERM GOAL #1   Title Increase in flexion and abduction ROM acitvely by 10 deg by 12/22   Baseline see flowsheet  flex 142, abd 120 and IR 57   Time 3   Period Weeks   Status Achieved     PT SHORT TERM GOAL #2   Title Independent in HEP as it has been established    Baseline able to perform HEP, tries to do red t band several times a day on computer   Time 3   Period Weeks   Status Achieved           PT Long Term Goals - 06/03/16 1610      PT LONG TERM GOAL #1   Title Pt will be able to reach behind back to at least T10 to demo improved functional ROM by 06/13/16   Baseline Reaches to T-12 on left.  right about to reach T-8 with thumb   Time 6   Period Weeks   Status Achieved     PT LONG TERM GOAL #2   Title Pt will be able to don jacket with minimal discomfort in shoulder   Time 6   Period Weeks     PT LONG TERM GOAL #3   Title FOTO to 71% ability to indicate significant functional improvment.    Baseline 60% ability today   58% on eval    Time 6   Period Weeks   Status On-going               Plan - 06/04/16 9604    Clinical Impression Statement Cues for periscapular control required but  pt was able to verbalize use of appropriate musculature. Improving ROM with tightness reported at anterior deltoid, esp in IR motions. Discussed return to gym and POC. Review of HEP stretches today.    PT Next Visit Plan  check goals, d/c 1/11   PT Home Exercise Plan supine flx with wand, sleeper stretch, scapular retraction/postural awareness, child pose, seated ER red tband every hour while on computer, Upper trap andlevator stretch on left. towel stretch with IR on left; thoracic ext over pillows, corner stretch, IR towel assisted stretch.   Consulted and Agree with Plan of Care Patient      Patient will benefit from skilled therapeutic intervention in order to improve the following deficits and impairments:     Visit Diagnosis: Left shoulder pain, unspecified chronicity  Stiffness of left shoulder, not elsewhere classified     Problem List Patient Active Problem List   Diagnosis Date Noted  . Adhesive capsulitis of left shoulder 01/18/2016  . Adhesive capsulitis of right shoulder 03/24/2011  . Retinal detachment 12/06/2010  . Rotator cuff syndrome 12/06/2010  . History of pancreatitis 10/22/2010  . Hypertriglyceridemia 10/22/2010  . Diabetes mellitus type II, uncontrolled (HCC) 10/22/2010    Ninah Moccio C. Jacinta Penalver PT, DPT 06/04/16 9:02 AM   [facility] [address] [place], [place], [address] Phone: [phone]   Fax:  [phone]  Name: [name] MRN: [id number] Date of Birth: 1965/08/08

## 2016-06-05 ENCOUNTER — Ambulatory Visit: Payer: BLUE CROSS/BLUE SHIELD | Admitting: Physical Therapy

## 2016-06-09 ENCOUNTER — Ambulatory Visit: Payer: BLUE CROSS/BLUE SHIELD | Admitting: Physical Therapy

## 2016-06-09 ENCOUNTER — Telehealth: Payer: Self-pay | Admitting: Physical Therapy

## 2016-06-09 NOTE — Telephone Encounter (Signed)
Called at 10 am, left message, to let patient know that he missed his 9:30 appointment this morning. I told him that there are openings later today if he would like to call back and schedule one of those.  Blaike Newburn C. Yoona Ishii PT, DPT 06/09/16 10:04 AM

## 2016-06-10 ENCOUNTER — Encounter: Payer: Self-pay | Admitting: Physical Therapy

## 2016-06-10 ENCOUNTER — Ambulatory Visit: Payer: BLUE CROSS/BLUE SHIELD | Admitting: Physical Therapy

## 2016-06-10 DIAGNOSIS — M25512 Pain in left shoulder: Secondary | ICD-10-CM | POA: Diagnosis not present

## 2016-06-10 DIAGNOSIS — M25612 Stiffness of left shoulder, not elsewhere classified: Secondary | ICD-10-CM

## 2016-06-10 NOTE — Therapy (Signed)
[facility] [address] [place], [place], [address] Phone: [phone]   Fax:  [phone]  Physical Therapy Treatment  Patient Details  Name: [name] MRN: [id number] Date of Birth: 1966/05/05 Referring Provider: Nestor RampSara L Neal, MD  Encounter Date: 06/10/2016      PT End of Session - 06/10/16 0758    Visit Number 12   Number of Visits 17   Date for PT Re-Evaluation 06/13/16   Authorization Type BCBS 30 visit limit   PT Start Time 0800   PT Stop Time 0854   PT Time Calculation (min) 54 min   Activity Tolerance Patient tolerated treatment well   Behavior During Therapy [facility] for tasks assessed/performed      Past Medical History:  Diagnosis Date  . ADHD (attention deficit hyperactivity disorder)   . Anxiety   . Anxiety and depression   . Depression   . Diabetes type 2, controlled (HCC)   . Diverticulosis   . Hyperlipidemia   . Kidney stones   . Pancreatitis   . Stomach ulcer     Past Surgical History:  Procedure Laterality Date  . ANKLE SURGERY    . CERVICAL DISC SURGERY     C4-C5  . ELBOW SURGERY    . RETINAL DETACHMENT SURGERY    . RHINOPLASTY    . VASECTOMY      There were no vitals filed for this visit.      Subjective Assessment - 06/10/16 0800    Subjective Pt reports shoulder feels "fine"   Currently in Pain? No/denies            [facility] - 06/10/16 0001      AROM   Left Shoulder Flexion 133 Degrees   Left Shoulder ABduction 131 Degrees   Left Shoulder Internal Rotation --  T8     Strength   Left Shoulder Flexion 4+/5   Left Shoulder ABduction 5/5   Left Shoulder Internal Rotation 4+/5   Left Shoulder External Rotation 4/5                     [facility] Adult PT Treatment/Exercise - 06/10/16 0001      Shoulder Exercises: Prone   Flexion Limitations prone Ys red tband tied at table cross bar   Internal Rotation Limitations IR with lift off from low  lumbar   Horizontal ABduction 1 20 reps   Theraband Level (Shoulder Horizontal ABduction 1) Level 2 (Red)   Horizontal ABduction 1 Limitations thumb up, band at table cross bar   Other Prone Exercises yellow tband behind back, pull with extension 10x10 pulses     Shoulder Exercises: Sidelying   External Rotation 20 reps   Theraband Level (Shoulder External Rotation) Level 3 (Green)     Shoulder Exercises: Standing   ABduction Limitations scaption from anchored resistance     Shoulder Exercises: ROM/Strengthening   UBE (Upper Arm Bike) 3'/3' L2     Shoulder Exercises: Stretch   Corner Stretch Limitations door pec stretch   Other Shoulder Stretches IR behind back with strap   Other Shoulder Stretches supine flexion with inhale/exhale     Moist Heat Therapy   Number Minutes Moist Heat 10 Minutes   Moist Heat Location Shoulder                PT Education - 06/10/16 0805    Education provided Yes   Education Details exercise form/rationale   Person(s) Educated Patient  Methods Explanation;Demonstration;Tactile cues;Verbal cues   Comprehension Verbalized understanding;Returned demonstration;Verbal cues required;Tactile cues required;Need further instruction          PT Short Term Goals - 05/15/16 0936      PT SHORT TERM GOAL #1   Title Increase in flexion and abduction ROM acitvely by 10 deg by 12/22   Baseline see flowsheet  flex 142, abd 120 and IR 57   Time 3   Period Weeks   Status Achieved     PT SHORT TERM GOAL #2   Title Independent in HEP as it has been established    Baseline able to perform HEP, tries to do red t band several times a day on computer   Time 3   Period Weeks   Status Achieved           PT Long Term Goals - 06/03/16 4782      PT LONG TERM GOAL #1   Title Pt will be able to reach behind back to at least T10 to demo improved functional ROM by 06/13/16   Baseline Reaches to T-12 on left.  right about to reach T-8 with thumb   Time  6   Period Weeks   Status Achieved     PT LONG TERM GOAL #2   Title Pt will be able to don jacket with minimal discomfort in shoulder   Time 6   Period Weeks     PT LONG TERM GOAL #3   Title FOTO to 71% ability to indicate significant functional improvment.    Baseline 60% ability today   58% on eval    Time 6   Period Weeks   Status On-going               Plan - 06/10/16 0849    Clinical Impression Statement Review of exercises today and importance of continued exercise for maintenance. Pt will be d/c at next visit to independend HEP.   PT Next Visit Plan  d/c 1/11   PT Home Exercise Plan supine flx with wand, sleeper stretch, scapular retraction/postural awareness, child pose, seated ER red tband every hour while on computer, Upper trap andlevator stretch on left. towel stretch with IR on left; thoracic ext over pillows, corner stretch, IR towel assisted stretch.   Consulted and Agree with Plan of Care Patient      Patient will benefit from skilled therapeutic intervention in order to improve the following deficits and impairments:     Visit Diagnosis: Left shoulder pain, unspecified chronicity  Stiffness of left shoulder, not elsewhere classified     Problem List Patient Active Problem List   Diagnosis Date Noted  . Adhesive capsulitis of left shoulder 01/18/2016  . Adhesive capsulitis of right shoulder 03/24/2011  . Retinal detachment 12/06/2010  . Rotator cuff syndrome 12/06/2010  . History of pancreatitis 10/22/2010  . Hypertriglyceridemia 10/22/2010  . Diabetes mellitus type II, uncontrolled (HCC) 10/22/2010    Darwyn Ponzo C. Desera Graffeo PT, DPT 06/10/16 8:51 AM   [facility] [address] [place], [place], [address] Phone: [phone]   Fax:  [phone]  Name: [name] MRN: [id number] Date of Birth: Jan 18, 1966

## 2016-06-12 ENCOUNTER — Encounter: Payer: Self-pay | Admitting: Physical Therapy

## 2016-06-12 ENCOUNTER — Ambulatory Visit: Payer: BLUE CROSS/BLUE SHIELD | Admitting: Physical Therapy

## 2016-06-12 DIAGNOSIS — M25512 Pain in left shoulder: Secondary | ICD-10-CM

## 2016-06-12 DIAGNOSIS — M25612 Stiffness of left shoulder, not elsewhere classified: Secondary | ICD-10-CM

## 2016-06-12 NOTE — Therapy (Signed)
[place], [place], [address] Phone: [phone]   Fax:  [phone]  Physical Therapy Treatment/Discharge Summary  Patient Details  Name: [name] MRN: [id number] Date of Birth: December 11, 1965 Referring Provider: Dickie La, MD  Encounter Date: 06/12/2016      PT End of Session - 06/12/16 0934    Visit Number 13   Number of Visits 17   Date for PT Re-Evaluation 06/13/16   Authorization Type [employer] 30 visit limit   PT Start Time 0930   PT Stop Time 1005   PT Time Calculation (min) 35 min   Activity Tolerance Patient tolerated treatment well   Behavior During Therapy [facility] for tasks assessed/performed      Past Medical History:  Diagnosis Date  . ADHD (attention deficit hyperactivity disorder)   . Anxiety   . Anxiety and depression   . Depression   . Diabetes type 2, controlled ([place])   . Diverticulosis   . Hyperlipidemia   . Kidney stones   . Pancreatitis   . Stomach ulcer     Past Surgical History:  Procedure Laterality Date  . ANKLE SURGERY    . CERVICAL DISC SURGERY     C4-C5  . ELBOW SURGERY    . RETINAL DETACHMENT SURGERY    . RHINOPLASTY    . VASECTOMY      There were no vitals filed for this visit.      Subjective Assessment - 06/12/16 0932    Subjective Pt denies pain in shoulder today.    Currently in Pain? No/denies            [facility] PT Assessment - 06/12/16 0001      Assessment   Medical Diagnosis Adhesive capsulitis   Referring Provider Dickie La, MD     AROM   Left Shoulder Flexion 132 Degrees   Left Shoulder ABduction 134 Degrees   Left Shoulder Internal Rotation --  T8     Strength   Left Shoulder Flexion 4+/5   Left Shoulder ABduction 4+/5   Left Shoulder Internal Rotation 5/5   Left Shoulder External Rotation 4+/5                     [facility] Adult PT Treatment/Exercise - 06/12/16 0001      Shoulder Exercises: ROM/Strengthening    UBE (Upper Arm Bike) 3'/3' L2                PT Education - 06/12/16 0933    Education provided Yes   Education Details exercise form/rationale, HEP, other forms of exercise- yoga Tai chi, adhesive caspulitis and what to watch for in the future.    Person(s) Educated Patient   Methods Explanation;Demonstration;Tactile cues;Verbal cues   Comprehension Verbalized understanding;Returned demonstration;Verbal cues required;Tactile cues required          PT Short Term Goals - 05/15/16 0936      PT SHORT TERM GOAL #1   Title Increase in flexion and abduction ROM acitvely by 10 deg by 12/22   Baseline see flowsheet  flex 142, abd 120 and IR 57   Time 3   Period Weeks   Status Achieved     PT SHORT TERM GOAL #2   Title Independent in HEP as it has been established    Baseline able to perform HEP, tries to do red t band several times a day on computer   Time 3   Period  Weeks   Status Achieved           PT Long Term Goals - 06/12/16 0934      PT LONG TERM GOAL #1   Title Pt will be able to reach behind back to at least T10 to demo improved functional ROM by 06/13/16   Baseline Reaches to T-12 on left.  right about to reach T-8 with thumb   Status Achieved     PT LONG TERM GOAL #2   Title Pt will be able to don jacket with minimal discomfort in shoulder   Baseline denies pain   Status Achieved     PT LONG TERM GOAL #3   Title FOTO to 71% ability to indicate significant functional improvment.    Baseline 63% ability on 1/11   Status Not Met     PT LONG TERM GOAL #4   Title Pt will demo MMT 5/5 to indicate necessary stability provided by RC and periscapular musculature   Baseline see flowsheet   Status Partially Met               Plan - 06/12/16 1007    Clinical Impression Statement Pt has been d/c at this time to independent program. Pt cont to have ROM and strength limitations as outlined in flowsheet but are expected as adhesive capsulits progresses.  Pt was educated on importance of continuing to perform stretches and exercises as well as be aware of posture to continue making progress with shoulder. Pt is frustrated because L shoulder does not feel as able as the R but is understanding of impact of adhesive capsulitis. Pt was instructed to contact us with any further questions or needs.    Consulted and Agree with Plan of Care Patient      Patient will benefit from skilled therapeutic intervention in order to improve the following deficits and impairments:     Visit Diagnosis: Left shoulder pain, unspecified chronicity  Stiffness of left shoulder, not elsewhere classified     Problem List Patient Active Problem List   Diagnosis Date Noted  . Adhesive capsulitis of left shoulder 01/18/2016  . Adhesive capsulitis of right shoulder 03/24/2011  . Retinal detachment 12/06/2010  . Rotator cuff syndrome 12/06/2010  . History of pancreatitis 10/22/2010  . Hypertriglyceridemia 10/22/2010  . Diabetes mellitus type II, uncontrolled ([facility]) 10/22/2010  PHYSICAL THERAPY DISCHARGE SUMMARY  Visits from Start of Care: 13  Current functional level related to goals / functional outcomes: See above   Remaining deficits: See above   Education / Equipment: Anatomy of condition, POC, HEP, exercise form/rationale  Plan: Patient agrees to discharge.  Patient goals were partially met. Patient is being discharged due to                                                     ?????     Pt has reached a level appropriate for d/c at this time in consideration of diagnosis and progression.     C.  PT, DPT 06/12/16 10:11 AM   [facility] [address] [place], [place], [address] Phone: [phone]   Fax:  [phone]  Name: [name] MRN: [id number] Date of Birth: 02/18/1966    

## 2016-07-16 ENCOUNTER — Emergency Department (HOSPITAL_COMMUNITY)
Admission: EM | Admit: 2016-07-16 | Discharge: 2016-07-16 | Disposition: A | Discharge status: Home / Self Care | Payer: BLUE CROSS/BLUE SHIELD | Attending: Emergency Medicine | Admitting: Emergency Medicine

## 2016-07-16 ENCOUNTER — Emergency Department (HOSPITAL_COMMUNITY): Payer: BLUE CROSS/BLUE SHIELD

## 2016-07-16 ENCOUNTER — Encounter (HOSPITAL_COMMUNITY): Payer: Self-pay | Admitting: *Deleted

## 2016-07-16 DIAGNOSIS — W1830XA Fall on same level, unspecified, initial encounter: Secondary | ICD-10-CM | POA: Insufficient documentation

## 2016-07-16 DIAGNOSIS — Y999 Unspecified external cause status: Secondary | ICD-10-CM | POA: Diagnosis not present

## 2016-07-16 DIAGNOSIS — R42 Dizziness and giddiness: Secondary | ICD-10-CM

## 2016-07-16 DIAGNOSIS — R519 Headache, unspecified: Secondary | ICD-10-CM

## 2016-07-16 DIAGNOSIS — E860 Dehydration: Secondary | ICD-10-CM | POA: Diagnosis not present

## 2016-07-16 DIAGNOSIS — S8002XA Contusion of left knee, initial encounter: Secondary | ICD-10-CM | POA: Diagnosis not present

## 2016-07-16 DIAGNOSIS — S8000XA Contusion of unspecified knee, initial encounter: Secondary | ICD-10-CM

## 2016-07-16 DIAGNOSIS — R51 Headache: Secondary | ICD-10-CM | POA: Insufficient documentation

## 2016-07-16 DIAGNOSIS — S81011A Laceration without foreign body, right knee, initial encounter: Secondary | ICD-10-CM | POA: Diagnosis not present

## 2016-07-16 DIAGNOSIS — T421X1A Poisoning by iminostilbenes, accidental (unintentional), initial encounter: Secondary | ICD-10-CM | POA: Diagnosis not present

## 2016-07-16 DIAGNOSIS — Y929 Unspecified place or not applicable: Secondary | ICD-10-CM | POA: Insufficient documentation

## 2016-07-16 DIAGNOSIS — I10 Essential (primary) hypertension: Secondary | ICD-10-CM | POA: Diagnosis not present

## 2016-07-16 DIAGNOSIS — T148XXA Other injury of unspecified body region, initial encounter: Secondary | ICD-10-CM

## 2016-07-16 DIAGNOSIS — S8001XA Contusion of right knee, initial encounter: Secondary | ICD-10-CM | POA: Insufficient documentation

## 2016-07-16 DIAGNOSIS — F909 Attention-deficit hyperactivity disorder, unspecified type: Secondary | ICD-10-CM | POA: Insufficient documentation

## 2016-07-16 DIAGNOSIS — S81012A Laceration without foreign body, left knee, initial encounter: Secondary | ICD-10-CM | POA: Diagnosis not present

## 2016-07-16 DIAGNOSIS — Y939 Activity, unspecified: Secondary | ICD-10-CM | POA: Insufficient documentation

## 2016-07-16 DIAGNOSIS — E1065 Type 1 diabetes mellitus with hyperglycemia: Secondary | ICD-10-CM | POA: Insufficient documentation

## 2016-07-16 DIAGNOSIS — R55 Syncope and collapse: Secondary | ICD-10-CM | POA: Insufficient documentation

## 2016-07-16 DIAGNOSIS — Z79899 Other long term (current) drug therapy: Secondary | ICD-10-CM | POA: Diagnosis not present

## 2016-07-16 DIAGNOSIS — R4020 Unspecified coma: Secondary | ICD-10-CM

## 2016-07-16 DIAGNOSIS — Z87891 Personal history of nicotine dependence: Secondary | ICD-10-CM | POA: Diagnosis not present

## 2016-07-16 DIAGNOSIS — S80211A Abrasion, right knee, initial encounter: Secondary | ICD-10-CM | POA: Diagnosis present

## 2016-07-16 LAB — I-STAT TROPONIN, ED: Troponin i, poc: 0 ng/mL (ref 0.00–0.08)

## 2016-07-16 LAB — URINALYSIS, ROUTINE W REFLEX MICROSCOPIC
BILIRUBIN URINE: NEGATIVE
Bacteria, UA: NONE SEEN
HGB URINE DIPSTICK: NEGATIVE
KETONES UR: 20 mg/dL — AB
LEUKOCYTES UA: NEGATIVE
NITRITE: NEGATIVE
PH: 5 (ref 5.0–8.0)
Protein, ur: NEGATIVE mg/dL
SPECIFIC GRAVITY, URINE: 1.037 — AB (ref 1.005–1.030)
Squamous Epithelial / LPF: NONE SEEN

## 2016-07-16 LAB — CBC
HCT: 41.4 % (ref 39.0–52.0)
HEMOGLOBIN: 14.6 g/dL (ref 13.0–17.0)
MCH: 30.6 pg (ref 26.0–34.0)
MCHC: 35.3 g/dL (ref 30.0–36.0)
MCV: 86.8 fL (ref 78.0–100.0)
PLATELETS: 192 10*3/uL (ref 150–400)
RBC: 4.77 MIL/uL (ref 4.22–5.81)
RDW: 13 % (ref 11.5–15.5)
WBC: 7 10*3/uL (ref 4.0–10.5)

## 2016-07-16 LAB — COMPREHENSIVE METABOLIC PANEL
ALK PHOS: 78 U/L (ref 38–126)
ALT: 26 U/L (ref 17–63)
ANION GAP: 13 (ref 5–15)
AST: 24 U/L (ref 15–41)
Albumin: 4.2 g/dL (ref 3.5–5.0)
BUN: 15 mg/dL (ref 6–20)
CALCIUM: 9.4 mg/dL (ref 8.9–10.3)
CO2: 25 mmol/L (ref 22–32)
Chloride: 96 mmol/L — ABNORMAL LOW (ref 101–111)
Creatinine, Ser: 1.18 mg/dL (ref 0.61–1.24)
Glucose, Bld: 288 mg/dL — ABNORMAL HIGH (ref 65–99)
Potassium: 4.2 mmol/L (ref 3.5–5.1)
Sodium: 134 mmol/L — ABNORMAL LOW (ref 135–145)
TOTAL PROTEIN: 7.3 g/dL (ref 6.5–8.1)
Total Bilirubin: 1.4 mg/dL — ABNORMAL HIGH (ref 0.3–1.2)

## 2016-07-16 LAB — CBG MONITORING, ED
GLUCOSE-CAPILLARY: 282 mg/dL — AB (ref 65–99)
Glucose-Capillary: 293 mg/dL — ABNORMAL HIGH (ref 65–99)
Glucose-Capillary: 234 mg/dL — ABNORMAL HIGH (ref 65–99)

## 2016-07-16 LAB — RAPID URINE DRUG SCREEN, HOSP PERFORMED
AMPHETAMINES: POSITIVE — AB
BENZODIAZEPINES: NOT DETECTED
Barbiturates: NOT DETECTED
Cocaine: NOT DETECTED
OPIATES: NOT DETECTED
Tetrahydrocannabinol: NOT DETECTED

## 2016-07-16 LAB — ETHANOL: Alcohol, Ethyl (B): <5 mg/dL (ref <5)

## 2016-07-16 LAB — PHOSPHORUS: PHOSPHORUS: 1.8 mg/dL — AB (ref 2.5–4.6)

## 2016-07-16 LAB — CARBAMAZEPINE LEVEL, TOTAL: Carbamazepine Lvl: 16.2 ug/mL (ref 4.0–12.0)

## 2016-07-16 LAB — LIPASE, BLOOD: Lipase: 24 U/L (ref 11–51)

## 2016-07-16 LAB — MAGNESIUM: Magnesium: 2.1 mg/dL (ref 1.7–2.4)

## 2016-07-16 MED ORDER — INSULIN GLARGINE 100 UNIT/ML ~~LOC~~ SOLN
70.0000 [IU] | Freq: Once | SUBCUTANEOUS | Status: AC
  Administered 2016-07-16: 70 [IU] via SUBCUTANEOUS
  Filled 2016-07-16: qty 0.7

## 2016-07-16 MED ORDER — ACETAMINOPHEN 325 MG PO TABS
650.0000 mg | ORAL_TABLET | Freq: Once | ORAL | Status: AC
  Administered 2016-07-16: 650 mg via ORAL
  Filled 2016-07-16: qty 2

## 2016-07-16 MED ORDER — TETANUS-DIPHTH-ACELL PERTUSSIS 5-2.5-18.5 LF-MCG/0.5 IM SUSP
0.5000 mL | Freq: Once | INTRAMUSCULAR | Status: AC
  Administered 2016-07-16: 0.5 mL via INTRAMUSCULAR
  Filled 2016-07-16: qty 0.5

## 2016-07-16 MED ORDER — SODIUM CHLORIDE 0.9 % IV BOLUS (SEPSIS)
2000.0000 mL | Freq: Once | INTRAVENOUS | Status: AC
  Administered 2016-07-16: 2000 mL via INTRAVENOUS

## 2016-07-16 NOTE — Discharge Instructions (Signed)
Your symptoms are likely due to having too much tegretol in your blood. DO NOT TAKE YOUR TEGRETOL TONIGHT OR TOMORROW, and call your primary care doctor to ask when you should restart that medication. Stay well hydrated, start taking a multivitamin. Take all home medications as directed (except for tegretol), including your insulin. For your knee pain, use ice and elevate your knees, alternate between tylenol and motrin as needed for pain. For your headaches, use tylenol or motrin as needed. Follow up with your primary care doctor in 3-5 days for recheck of symptoms. Return to the ER for changes or worsening symptoms.

## 2016-07-16 NOTE — ED Provider Notes (Signed)
[facility]-EMERGENCY DEPT Provider Note   CSN: [id number] Arrival date & time: 07/16/16  1411     History   Chief Complaint Chief Complaint  Patient presents with  . Emesis  . Altered Mental Status    HPI [name] is a 51 y.o. male with a PMHx of ADHD, DM1, depression, anxiety, HLD, diverticulosis, pancreatitis, stomach ulcers, and adhesive capsulitis of both shoulders, who presents to the ED for evaluation after an event where he went to sleep and does not recall an entire day of his life. Patient states that on Monday he took his carbamazepine 200 mg and Ambien 10 mg and went to sleep, woke up Tuesday night and found NBNB emesis in his bed, in the sink, and in the toilet, and did not recall anything for the entire day or anything after Monday night when he went to sleep. States that he was by himself and there was no one there to witness what happened. He is not sure whether he may have fallen and hit his head or what may have happened during that time period. He denies alcohol use or drug use. He admits that he has been out of most of his medications recently, hasn't taken any insulin or his neurontin since Saturday and hasn't taken his Altace or fenofibrate x1 month. He states that since awakening on Tuesday night he has felt lightheaded with standing, has a mild headache, and noticed abrasions to both knees and his right ankle. He describes his headache as 3/10 constant generalized nonradiating aching pain in his head, worse with sitting up, improved with lying down, and with no other treatments tried prior to arrival. He notes that there is a sore area on his forehead as well. He is unsure of his last tetanus shot. He denies being on any blood thinners. Denies hx of seizures.  He denies fevers, chills, cough, URI symptoms, CP, SOB, abd pain, ongoing N/V, diarrhea, constipation, melena, hematochezia, hematemesis, hematuria, dysuria, myalgias, arthralgias, joint swelling, bruising,  numbness, tingling, focal weakness, vision changes, neck or back pain, incontinence of urine/stool, tongue biting or loosening of teeth, facial pain, or any other complaints at this time. Denies SI, HI, AVH, or intentionally ingesting anything in excess.   Physical Exam  Constitutional: He is oriented to person, place, and time. Vital signs are normal. He appears well-developed and well-nourished.  Non-toxic appearance. No distress.  Afebrile, nontoxic, NAD  HENT:  Head: Normocephalic and atraumatic. Head is without raccoon's eyes, without Battle's sign, without abrasion and without contusion.  Right Ear: Hearing, tympanic membrane, external ear and ear canal normal.  Left Ear: Hearing, tympanic membrane, external ear and ear canal normal.  Nose: Nose normal.  Mouth/Throat: Uvula is midline and oropharynx is clear and moist. Mucous membranes are dry. No oral lesions. No trismus in the jaw. No uvula swelling. Tonsils are 0 on the right. Tonsils are 0 on the left. No tonsillar exudate.  Mild frontal forehead TTP, no bruising or abrasions, no raccoon eyes or battle's sign, no scalp crepitus or deformity, no bony instability. No s/sx of basilar skull fx. Mildly dry mucous membranes. Ears are clear bilaterally, no hemotympanum, no otorrhea. Nose clear, no rhinorrhea. Oropharynx clear without uvular swelling or deviation, no trismus or drooling, no tonsillar swelling or erythema, no exudates.    Eyes: Conjunctivae and EOM are normal. Pupils are equal, round, and reactive to light. Right eye exhibits no discharge. Left eye exhibits no discharge.  PERRL, EOMI, no nystagmus, no visual  field deficits   Neck: Normal range of motion. Neck supple. No spinous process tenderness and no muscular tenderness present. No neck rigidity. Normal range of motion present.  FROM intact without spinous process TTP, no bony stepoffs or deformities, no paraspinous muscle TTP or muscle spasms. No rigidity or meningeal signs. No  bruising or swelling.   Cardiovascular: Normal rate, regular rhythm, normal heart sounds and intact distal pulses.  Exam reveals no gallop and no friction rub.   No murmur heard. Pulmonary/Chest: Effort normal and breath sounds normal. No respiratory distress. He has no decreased breath sounds. He has no wheezes. He has no rhonchi. He has no rales.  Abdominal: Soft. Normal appearance and bowel sounds are normal. He exhibits no distension. There is no tenderness. There is no rigidity, no rebound, no guarding, no CVA tenderness, no tenderness at McBurney's point and negative Murphy's sign.  Soft, NTND, +BS throughout, no r/g/r, neg murphy's, neg mcburney's, no CVA TTP   Musculoskeletal: Normal range of motion.       Right knee: He exhibits laceration (abrasion). He exhibits normal range of motion, no swelling, no effusion, no ecchymosis, no deformity, no erythema, normal alignment, no LCL laxity, normal patellar mobility and no MCL laxity. Tenderness found.       Left knee: He exhibits laceration (abrasion). He exhibits normal range of motion, no swelling, no effusion, no ecchymosis, no deformity, no erythema, normal alignment, no LCL laxity and no MCL laxity. Tenderness found.       Right ankle: He exhibits laceration (abrasion). He exhibits normal range of motion, no swelling, no ecchymosis, no deformity and normal pulse. Tenderness. Achilles tendon normal.  MAE x4 Strength and sensation grossly intact in all extremities Distal pulses intact Gait steady B/l knees with small healing abrasions, appear older, no ongoing bleeding. No bruising or swelling, no crepitus or deformity, no erythema or warmth, FROM intact, mild tenderness over the abrasions but no other focal bony TTP of the remainder of the legs bilaterally, or in either upper extremity.  Small abrasion to R lateral ankle which appears old, no ongoing bleeding, no swelling or bruising, FROM intact, nonTTP, no crepitus or deformities. No  evidence of trauma to remainder of extremities.  Soft compartments in all extremities C-spine exam as above, all other spinal levels nonTTP without bony stepoffs or deformities   Neurological: He is alert and oriented to person, place, and time. He has normal strength. No cranial nerve deficit or sensory deficit. Coordination and gait normal. GCS eye subscore is 4. GCS verbal subscore is 5. GCS motor subscore is 6.  CN 2-12 grossly intact A&O x4 GCS 15 Sensation and strength intact Gait nonataxic including with tandem walking Coordination with finger-to-nose WNL Neg pronator drift   Skin: Skin is warm and dry. Abrasion noted. No bruising and no rash noted.  No bruising over all exposed surfaces; small abrasions to b/l knees and R ankle as mentioned above, all of which appear older and well healing  Psychiatric: He has a normal mood and affect.  Nursing note and vitals reviewed. The history is provided by the patient and medical records. No language interpreter was used.  Emesis   Associated symptoms include headaches. Pertinent negatives include no abdominal pain, no arthralgias, no chills, no cough, no diarrhea, no fever and no myalgias.  Altered Mental Status   Pertinent negatives include no weakness and no hallucinations.  Loss of Consciousness   This is a new problem. The current episode started 2 days  ago. The problem occurs rarely. The problem has been resolved. He lost consciousness for a period of greater than 5 minutes. The problem is associated with an unknown factor. Associated symptoms include headaches, light-headedness and vomiting (none since awakening yesterday). Pertinent negatives include abdominal pain, back pain, bladder incontinence, bowel incontinence, chest pain, fever, focal sensory loss, focal weakness, nausea, visual change and weakness. He has tried nothing for the symptoms. The treatment provided no relief. His past medical history is significant for DM and HTN. His  past medical history does not include seizures.    Past Medical History:  Diagnosis Date  . ADHD (attention deficit hyperactivity disorder)   . Anxiety   . Anxiety and depression   . Depression   . Diabetes type 2, controlled (HCC)   . Diverticulosis   . Hyperlipidemia   . Kidney stones   . Pancreatitis   . Stomach ulcer     Patient Active Problem List   Diagnosis Date Noted  . Adhesive capsulitis of left shoulder 01/18/2016  . Adhesive capsulitis of right shoulder 03/24/2011  . Retinal detachment 12/06/2010  . Rotator cuff syndrome 12/06/2010  . History of pancreatitis 10/22/2010  . Hypertriglyceridemia 10/22/2010  . Diabetes mellitus type II, uncontrolled (HCC) 10/22/2010    Past Surgical History:  Procedure Laterality Date  . ANKLE SURGERY    . CERVICAL DISC SURGERY     C4-C5  . ELBOW SURGERY    . RETINAL DETACHMENT SURGERY    . RHINOPLASTY    . VASECTOMY         Home Medications    Prior to Admission medications   Medication Sig Start Date End Date Taking? Authorizing Provider  Amphetamine Sulfate (EVEKEO) 10 MG TABS Take 10 mg by mouth.   Yes Historical Provider, MD  carbamazepine (TEGRETOL XR) 200 MG 12 hr tablet Take 200 mg by mouth 3 (three) times daily.  03/06/15  Yes Historical Provider, MD  DULoxetine (CYMBALTA) 30 MG capsule Take 1 capsule daily for 2 weeks, then 2 capsules daily 10/17/15  Yes Historical Provider, MD  fenofibrate 160 MG tablet Take 160 mg by mouth.   Yes Historical Provider, MD  gabapentin (NEURONTIN) 800 MG tablet Take 800 mg by mouth 3 (three) times daily.   Yes Historical Provider, MD  insulin aspart (NOVOLOG) 100 UNIT/ML injection Inject 15 Units into the skin 3 (three) times daily before meals.    Yes Historical Provider, MD  insulin glargine (LANTUS) 100 UNIT/ML injection Inject 70 Units into the skin at bedtime.    Yes Historical Provider, MD  ramipril (ALTACE) 2.5 MG capsule Take 2.5 mg by mouth.   Yes Historical Provider, MD    zolpidem (AMBIEN) 10 MG tablet Take 10 mg by mouth.   Yes Historical Provider, MD  meloxicam (MOBIC) 15 MG tablet Take one a day for 10 days then take as needed Patient not taking: Reported on 07/16/2016 08/31/15   Briscoe Deutscher, DO  nitroGLYCERIN [facility]) 0.2 mg/hr patch Place 1/4 patch to affected area daily Patient not taking: Reported on 07/16/2016 08/31/15   Briscoe Deutscher, DO    Family History Family History  Problem Relation Age of Onset  . Stroke Father   . Breast cancer Mother   . Colon cancer Neg Hx     Social History Social History  Substance Use Topics  . Smoking status: Former Games developer  . Smokeless tobacco: Former Neurosurgeon  . Alcohol use No     Comment: Socially  Allergies   Patient has no known allergies.   Review of Systems Review of Systems  Constitutional: Negative for chills and fever.  HENT: Negative for rhinorrhea and sore throat.        No tongue biting  Eyes: Negative for visual disturbance.  Respiratory: Negative for cough and shortness of breath.   Cardiovascular: Positive for syncope. Negative for chest pain.  Gastrointestinal: Positive for vomiting (none since awakening yesterday). Negative for abdominal pain, blood in stool, bowel incontinence, constipation, diarrhea and nausea.  Genitourinary: Negative for bladder incontinence, difficulty urinating (no incontinence), dysuria and hematuria.  Musculoskeletal: Negative for arthralgias, back pain, myalgias and neck pain.  Skin: Positive for wound (b/l knees and R ankle abrasions). Negative for color change.  Allergic/Immunologic: Positive for immunocompromised state (DM1).  Neurological: Positive for syncope, light-headedness and headaches. Negative for focal weakness, weakness and numbness.  Hematological: Does not bruise/bleed easily.  Psychiatric/Behavioral: Negative for behavioral problems, hallucinations and suicidal ideas.   10 Systems reviewed and are negative for acute change except as noted in  the HPI.   Physical Exam Updated Vital Signs BP 143/81 (BP Location: Right Arm)   Pulse 78   Temp 98.5 F (36.9 C) (Oral)   Resp 18   SpO2 100%   Physical Exam  Constitutional: He is oriented to person, place, and time. Vital signs are normal. He appears well-developed and well-nourished.  Non-toxic appearance. No distress.  Afebrile, nontoxic, NAD  HENT:  Head: Normocephalic and atraumatic. Head is without raccoon's eyes, without Battle's sign, without abrasion and without contusion.  Right Ear: Hearing, tympanic membrane, external ear and ear canal normal.  Left Ear: Hearing, tympanic membrane, external ear and ear canal normal.  Nose: Nose normal.  Mouth/Throat: Uvula is midline and oropharynx is clear and moist. Mucous membranes are dry. No oral lesions. No trismus in the jaw. No uvula swelling. Tonsils are 0 on the right. Tonsils are 0 on the left. No tonsillar exudate.  Mild frontal forehead TTP, no bruising or abrasions, no raccoon eyes or battle's sign, no scalp crepitus or deformity, no bony instability. No s/sx of basilar skull fx. Mildly dry mucous membranes. Ears are clear bilaterally, no hemotympanum, no otorrhea. Nose clear, no rhinorrhea. Oropharynx clear without uvular swelling or deviation, no trismus or drooling, no tonsillar swelling or erythema, no exudates.    Eyes: Conjunctivae and EOM are normal. Pupils are equal, round, and reactive to light. Right eye exhibits no discharge. Left eye exhibits no discharge.  PERRL, EOMI, no nystagmus, no visual field deficits   Neck: Normal range of motion. Neck supple. No spinous process tenderness and no muscular tenderness present. No neck rigidity. Normal range of motion present.  FROM intact without spinous process TTP, no bony stepoffs or deformities, no paraspinous muscle TTP or muscle spasms. No rigidity or meningeal signs. No bruising or swelling.   Cardiovascular: Normal rate, regular rhythm, normal heart sounds and intact  distal pulses.  Exam reveals no gallop and no friction rub.   No murmur heard. Pulmonary/Chest: Effort normal and breath sounds normal. No respiratory distress. He has no decreased breath sounds. He has no wheezes. He has no rhonchi. He has no rales.  Abdominal: Soft. Normal appearance and bowel sounds are normal. He exhibits no distension. There is no tenderness. There is no rigidity, no rebound, no guarding, no CVA tenderness, no tenderness at McBurney's point and negative Murphy's sign.  Soft, NTND, +BS throughout, no r/g/r, neg murphy's, neg mcburney's, no CVA TTP  Musculoskeletal: Normal range of motion.       Right knee: He exhibits laceration (abrasion). He exhibits normal range of motion, no swelling, no effusion, no ecchymosis, no deformity, no erythema, normal alignment, no LCL laxity, normal patellar mobility and no MCL laxity. Tenderness found.       Left knee: He exhibits laceration (abrasion). He exhibits normal range of motion, no swelling, no effusion, no ecchymosis, no deformity, no erythema, normal alignment, no LCL laxity and no MCL laxity. Tenderness found.       Right ankle: He exhibits laceration (abrasion). He exhibits normal range of motion, no swelling, no ecchymosis, no deformity and normal pulse. Tenderness. Achilles tendon normal.  MAE x4 Strength and sensation grossly intact in all extremities Distal pulses intact Gait steady B/l knees with small healing abrasions, appear older, no ongoing bleeding. No bruising or swelling, no crepitus or deformity, no erythema or warmth, FROM intact, mild tenderness over the abrasions but no other focal bony TTP of the remainder of the legs bilaterally, or in either upper extremity.  Small abrasion to R lateral ankle which appears old, no ongoing bleeding, no swelling or bruising, FROM intact, nonTTP, no crepitus or deformities. No evidence of trauma to remainder of extremities.  Soft compartments in all extremities C-spine exam as  above, all other spinal levels nonTTP without bony stepoffs or deformities   Neurological: He is alert and oriented to person, place, and time. He has normal strength. No cranial nerve deficit or sensory deficit. Coordination and gait normal. GCS eye subscore is 4. GCS verbal subscore is 5. GCS motor subscore is 6.  CN 2-12 grossly intact A&O x4 GCS 15 Sensation and strength intact Gait nonataxic including with tandem walking Coordination with finger-to-nose WNL Neg pronator drift   Skin: Skin is warm and dry. Abrasion noted. No bruising and no rash noted.  No bruising over all exposed surfaces; small abrasions to b/l knees and R ankle as mentioned above, all of which appear older and well healing  Psychiatric: He has a normal mood and affect.  Nursing note and vitals reviewed.    ED Treatments / Results  Labs (all labs ordered are listed, but only abnormal results are displayed) Labs Reviewed  COMPREHENSIVE METABOLIC PANEL - Abnormal; Notable for the following:       Result Value   Sodium 134 (*)    Chloride 96 (*)    Glucose, Bld 288 (*)    Total Bilirubin 1.4 (*)    All other components within normal limits  URINALYSIS, ROUTINE W REFLEX MICROSCOPIC - Abnormal; Notable for the following:    Specific Gravity, Urine 1.037 (*)    Glucose, UA >=500 (*)    Ketones, ur 20 (*)    All other components within normal limits  PHOSPHORUS - Abnormal; Notable for the following:    Phosphorus 1.8 (*)    All other components within normal limits  CARBAMAZEPINE LEVEL, TOTAL - Abnormal; Notable for the following:    Carbamazepine Lvl 16.2 (*)    All other components within normal limits  RAPID URINE DRUG SCREEN, HOSP PERFORMED - Abnormal; Notable for the following:    Amphetamines POSITIVE (*)    All other components within normal limits  CBG MONITORING, ED - Abnormal; Notable for the following:    Glucose-Capillary 293 (*)    All other components within normal limits  CBG MONITORING, ED  - Abnormal; Notable for the following:    Glucose-Capillary 282 (*)    All  other components within normal limits  CBC  LIPASE, BLOOD  MAGNESIUM  ETHANOL  I-STAT TROPOININ, ED    EKG  EKG Interpretation  Date/Time:  Wednesday July 16 2016 14:26:29 EST Ventricular Rate:  80 PR Interval:  162 QRS Duration: 94 QT Interval:  366 QTC Calculation: 422 R Axis:   -13 Text Interpretation:  Poor data quality, interpretation may be adversely affected Normal sinus rhythm Moderate voltage criteria for LVH, may be normal variant Nonspecific T wave abnormality Abnormal ECG Since last tracing in 2013, LVH is new Artifact Confirmed by Effie Shy  MD, ELLIOTT [phone]) on 07/16/2016 3:47:34 PM       Radiology Ct Head Wo Contrast  Result Date: 07/16/2016 CLINICAL DATA:  memory loss for a day, head injury?, eval for intracranial pathology EXAM: CT HEAD WITHOUT CONTRAST TECHNIQUE: Contiguous axial images were obtained from the base of the skull through the vertex without intravenous contrast. COMPARISON:  One/8/13, brain MRI 06/30/2011 FINDINGS: Brain: No acute intracranial hemorrhage. No focal mass lesion. No CT evidence of acute infarction. No midline shift or mass effect. No hydrocephalus. Basilar cisterns are patent. Vascular: No hyperdense vessel or unexpected calcification. Skull: Normal. Negative for fracture or focal lesion. Sinuses/Orbits: Paranasal sinuses and mastoid air cells are clear. Orbits are clear. RIGHT scleral banding. Other: None. IMPRESSION: 1. No acute intracranial findings 2. No change from prior. Electronically Signed   By: Genevive Bi M.D.   On: 07/16/2016 16:45   Dg Knee Complete 4 Views Left  Result Date: 07/16/2016 CLINICAL DATA:  Bilateral knee pain following fall yesterday, initial encounter EXAM: LEFT KNEE - COMPLETE 4+ VIEW COMPARISON:  None. FINDINGS: No evidence of fracture, dislocation, or joint effusion. No evidence of arthropathy or other focal bone abnormality. Soft  tissues are unremarkable. IMPRESSION: No acute abnormality noted. Electronically Signed   By: Alcide Clever M.D.   On: 07/16/2016 16:15   Dg Knee Complete 4 Views Right  Result Date: 07/16/2016 CLINICAL DATA:  Right knee pain following fall yesterday, initial encounter EXAM: RIGHT KNEE - COMPLETE 4+ VIEW COMPARISON:  None. FINDINGS: Mild degenerative changes are noted medially. No acute fracture or dislocation is noted. No joint effusion is seen. IMPRESSION: No acute abnormality noted. Electronically Signed   By: Alcide Clever M.D.   On: 07/16/2016 16:15    Procedures Procedures (including critical care time)  Medications Ordered in ED Medications  insulin glargine (LANTUS) injection 70 Units (not administered)  sodium chloride 0.9 % bolus 2,000 mL (0 mLs Intravenous Stopped 07/16/16 1742)  Tdap (BOOSTRIX) injection 0.5 mL (0.5 mLs Intramuscular Given 07/16/16 1740)  acetaminophen (TYLENOL) tablet 650 mg (650 mg Oral Given 07/16/16 1739)     Initial Impression / Assessment and Plan / ED Course  I have reviewed the triage vital signs and the nursing notes.  Pertinent labs & imaging results that were available during my care of the patient were reviewed by me and considered in my medical decision making (see chart for details).     51 y.o. male here with loss of memory from mon to tues nights after taking carbamazepine and ambien. Woke up with vomit in the bed and bathroom but doesn't recall vomiting. Reports lightheadedness on standing, and mild generalized HA. Also reports abrasions to b/l knees and R ankle, which appear old. Mild tenderness to both knees, will obtain xray. Will update Tdap. Has been noncompliant on most of his meds, no insulin in 4 days. On exam, all extremities NVI with soft compartments, no  bruising, no other abrasions aside from to knees and R ankle, no scalp deformity or crepitus, no focal neuro deficits, no tongue trauma, mild tenderness to frontal forehead. Gait steady.  Labs thus far reveal: CMP with gluc 288, Na 134 which corrects based on glucose; bili 1.4, likely from mild dehydration, but no evidence of DKA. CBC WNL. EKG with mild LVH but otherwise no acute findings. Will add-on Mg, Phos, lipase, Trop, carbamazepine level, UDS, and EtOH level, and obtain b/l knee xrays and CT head. Also awaiting U/A. Will give 2L IVFs and tylenol as well as update Tdap. Will reassess shortly  6:24 PM Pt feeling somewhat better. U/A with few ketones and glucosuria however no evidence of infection. Trop neg. Mg level WNL; Phos level mildly low, will encourage pt to take multivitamin. Lipase WNL. Carbamazepine level 16.2 which is slightly over the therapeutic level, and would contribute to most of the pt's symptoms. UDS with +amphetamines (pt takes evekeo). EtOH level undetectable. Knee xrays neg. CT head neg. Pt denies that he took too many tegretol intentionally however he admits that he may have taken one extra tablet because he didn't have his neurontin and his peripheral neuropathy was aggravating him. Overall, I don't feel we need to admit him for further work up or testing, discussed holding his tegretol for tonight and tomorrow and f/up with his PCP regarding when to start taking it again; advised to start taking multivitamin. Discussed taking insulin as directed; will give him his lantus dose now since he doesn't have his insulin at home, but states he will pick up the insulin tomorrow from the pharmacy. Advised him to stay well hydrated. RICE, tylenol/motrin advised for knee contusions, and headache. F/up with PCP in 3-5 days. I explained the diagnosis and have given explicit precautions to return to the ER including for any other new or worsening symptoms. The patient understands and accepts the medical plan as it's been dictated and I have answered their questions. Discharge instructions concerning home care and prescriptions have been given. The patient is STABLE and is discharged  to home in good condition.    Final Clinical Impressions(s) / ED Diagnoses   Final diagnoses:  LOC (loss of consciousness) (HCC)  Lightheadedness  Hyperglycemia due to type 1 diabetes mellitus (HCC)  Dehydration  Accidental carbamazepine poisoning, initial encounter  Low phosphate levels  Abrasion  Contusion of knee, unspecified laterality, initial encounter  Acute nonintractable headache, unspecified headache type    New Prescriptions New Prescriptions   No medications on file       [id number] Lilias Lorensen, PA-C 07/16/16 1832    Mancel Bale, MD 07/17/16 1708

## 2016-07-16 NOTE — ED Triage Notes (Signed)
Pt reports taking his meds on Monday night for neuropathy and his ambien. Pt reports not waking up until Tuesday night and at that time pt noticed vomit around bed and in bathroom. Pt reports no memory of vomiting and still feels altered and confused. Reports being dizzy and has headache. Hx of dm type 1, cbg 293 at triage.

## 2016-07-16 NOTE — ED Notes (Signed)
Pt being transported to x-ray

## 2016-08-13 DIAGNOSIS — E538 Deficiency of other specified B group vitamins: Secondary | ICD-10-CM | POA: Insufficient documentation

## 2016-10-20 NOTE — Progress Notes (Signed)
Cancelling order as lab not collected, no need to re-order 

## 2016-12-12 ENCOUNTER — Ambulatory Visit (INDEPENDENT_AMBULATORY_CARE_PROVIDER_SITE_OTHER): Payer: BLUE CROSS/BLUE SHIELD | Admitting: Podiatry

## 2016-12-12 DIAGNOSIS — R2681 Unsteadiness on feet: Secondary | ICD-10-CM | POA: Diagnosis not present

## 2016-12-12 DIAGNOSIS — G600 Hereditary motor and sensory neuropathy: Secondary | ICD-10-CM

## 2016-12-12 DIAGNOSIS — M216X9 Other acquired deformities of unspecified foot: Secondary | ICD-10-CM

## 2016-12-12 DIAGNOSIS — S93499S Sprain of other ligament of unspecified ankle, sequela: Secondary | ICD-10-CM

## 2016-12-15 NOTE — Progress Notes (Signed)
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 He states he has noticed that his feet are becoming higher arched over time. He states he feels unbalanced these intervals ankle and he has had several accounts of him well in his ankle. He denies any open sores his last appointment with me. He is continue with inserts. They're not helping. Denies any systemic complaints such as fevers, chills, nausea, vomiting. No acute changes since last appointment, and no other complaints at this time.   Objective: AAO x3, NAD DP/PT pulses palpable bilaterally, CRT less than 3 seconds Cavus foot type is present bilaterally. There does appear to be slight progression within the deformity. Subjectively he states the symptoms again worsen he is more unbalanced and twisting his ankle more with the left side worse in the right. There is no open lesions or pre-ulcerative lesions. There is no overlying edema, erythema, increase in warmth bilaterally. There is no area pinpoint tenderness there is no pain vibratory sensation bilaterally. 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  -At this point given the progressive nature and he is getting subjectively worse I recommended a brace. We will start with the left side. We'll check with insurance coverage and get him in to see Raiford NobleRick in the next week about potential bracing to casting for a AFO. The patient is in agreement with this. -Patient encouraged to call the office with any questions, concerns, change in symptoms.   Ovid CurdMatthew Wagoner, DPM

## 2016-12-18 ENCOUNTER — Other Ambulatory Visit: Payer: BLUE CROSS/BLUE SHIELD | Admitting: Orthotics

## 2017-01-24 ENCOUNTER — Emergency Department (HOSPITAL_COMMUNITY)
Admission: EM | Admit: 2017-01-24 | Discharge: 2017-01-24 | Disposition: A | Discharge status: Home / Self Care | Payer: BLUE CROSS/BLUE SHIELD | Attending: Emergency Medicine | Admitting: Emergency Medicine

## 2017-01-24 ENCOUNTER — Encounter (HOSPITAL_COMMUNITY): Payer: Self-pay | Admitting: Emergency Medicine

## 2017-01-24 DIAGNOSIS — R252 Cramp and spasm: Secondary | ICD-10-CM | POA: Insufficient documentation

## 2017-01-24 DIAGNOSIS — R51 Headache: Secondary | ICD-10-CM | POA: Diagnosis not present

## 2017-01-24 DIAGNOSIS — R112 Nausea with vomiting, unspecified: Secondary | ICD-10-CM | POA: Insufficient documentation

## 2017-01-24 DIAGNOSIS — Z5321 Procedure and treatment not carried out due to patient leaving prior to being seen by health care provider: Secondary | ICD-10-CM | POA: Insufficient documentation

## 2017-01-24 DIAGNOSIS — R197 Diarrhea, unspecified: Secondary | ICD-10-CM | POA: Diagnosis not present

## 2017-01-24 DIAGNOSIS — M545 Low back pain: Secondary | ICD-10-CM | POA: Insufficient documentation

## 2017-01-24 DIAGNOSIS — R509 Fever, unspecified: Secondary | ICD-10-CM | POA: Insufficient documentation

## 2017-01-24 LAB — CBG MONITORING, ED: GLUCOSE-CAPILLARY: 108 mg/dL — AB (ref 65–99)

## 2017-01-24 MED ORDER — ACETAMINOPHEN 325 MG PO TABS
650.0000 mg | ORAL_TABLET | Freq: Once | ORAL | Status: AC | PRN
  Administered 2017-01-24: 650 mg via ORAL
  Filled 2017-01-24: qty 2

## 2017-01-24 MED ORDER — ONDANSETRON 4 MG PO TBDP
4.0000 mg | ORAL_TABLET | Freq: Once | ORAL | Status: AC | PRN
  Administered 2017-01-24: 4 mg via ORAL
  Filled 2017-01-24: qty 1

## 2017-01-24 NOTE — ED Triage Notes (Addendum)
Pt reports he has had a fever for the past several days. Has had n/v/d at home. Pt also reports lower back pain, leg cramping and HA. Last dose ibuprofen 0530. Has not had acetaminophen

## 2017-07-14 ENCOUNTER — Encounter (HOSPITAL_COMMUNITY): Payer: Self-pay | Admitting: Emergency Medicine

## 2017-07-14 ENCOUNTER — Emergency Department (HOSPITAL_COMMUNITY): Payer: BLUE CROSS/BLUE SHIELD

## 2017-07-14 DIAGNOSIS — F909 Attention-deficit hyperactivity disorder, unspecified type: Secondary | ICD-10-CM | POA: Diagnosis present

## 2017-07-14 DIAGNOSIS — G6181 Chronic inflammatory demyelinating polyneuritis: Secondary | ICD-10-CM | POA: Diagnosis present

## 2017-07-14 DIAGNOSIS — Z8711 Personal history of peptic ulcer disease: Secondary | ICD-10-CM

## 2017-07-14 DIAGNOSIS — Z79899 Other long term (current) drug therapy: Secondary | ICD-10-CM

## 2017-07-14 DIAGNOSIS — E559 Vitamin D deficiency, unspecified: Secondary | ICD-10-CM | POA: Diagnosis present

## 2017-07-14 DIAGNOSIS — T383X6A Underdosing of insulin and oral hypoglycemic [antidiabetic] drugs, initial encounter: Secondary | ICD-10-CM | POA: Diagnosis present

## 2017-07-14 DIAGNOSIS — Z803 Family history of malignant neoplasm of breast: Secondary | ICD-10-CM

## 2017-07-14 DIAGNOSIS — E785 Hyperlipidemia, unspecified: Secondary | ICD-10-CM | POA: Diagnosis present

## 2017-07-14 DIAGNOSIS — R0789 Other chest pain: Secondary | ICD-10-CM | POA: Diagnosis not present

## 2017-07-14 DIAGNOSIS — E869 Volume depletion, unspecified: Secondary | ICD-10-CM | POA: Diagnosis present

## 2017-07-14 DIAGNOSIS — Z91120 Patient's intentional underdosing of medication regimen due to financial hardship: Secondary | ICD-10-CM

## 2017-07-14 DIAGNOSIS — Z794 Long term (current) use of insulin: Secondary | ICD-10-CM

## 2017-07-14 DIAGNOSIS — Z9114 Patient's other noncompliance with medication regimen: Secondary | ICD-10-CM

## 2017-07-14 DIAGNOSIS — I10 Essential (primary) hypertension: Secondary | ICD-10-CM | POA: Diagnosis present

## 2017-07-14 DIAGNOSIS — E1110 Type 2 diabetes mellitus with ketoacidosis without coma: Principal | ICD-10-CM | POA: Diagnosis present

## 2017-07-14 DIAGNOSIS — E538 Deficiency of other specified B group vitamins: Secondary | ICD-10-CM | POA: Diagnosis present

## 2017-07-14 DIAGNOSIS — K2960 Other gastritis without bleeding: Secondary | ICD-10-CM | POA: Diagnosis present

## 2017-07-14 DIAGNOSIS — Z823 Family history of stroke: Secondary | ICD-10-CM

## 2017-07-14 DIAGNOSIS — Y92009 Unspecified place in unspecified non-institutional (private) residence as the place of occurrence of the external cause: Secondary | ICD-10-CM

## 2017-07-14 DIAGNOSIS — Z791 Long term (current) use of non-steroidal anti-inflammatories (NSAID): Secondary | ICD-10-CM

## 2017-07-14 DIAGNOSIS — K219 Gastro-esophageal reflux disease without esophagitis: Secondary | ICD-10-CM | POA: Diagnosis present

## 2017-07-14 DIAGNOSIS — B3781 Candidal esophagitis: Secondary | ICD-10-CM | POA: Diagnosis present

## 2017-07-14 DIAGNOSIS — Z87442 Personal history of urinary calculi: Secondary | ICD-10-CM

## 2017-07-14 DIAGNOSIS — F419 Anxiety disorder, unspecified: Secondary | ICD-10-CM | POA: Diagnosis present

## 2017-07-14 DIAGNOSIS — Z9852 Vasectomy status: Secondary | ICD-10-CM

## 2017-07-14 DIAGNOSIS — Z87891 Personal history of nicotine dependence: Secondary | ICD-10-CM

## 2017-07-14 DIAGNOSIS — K259 Gastric ulcer, unspecified as acute or chronic, without hemorrhage or perforation: Secondary | ICD-10-CM | POA: Diagnosis present

## 2017-07-14 DIAGNOSIS — E1140 Type 2 diabetes mellitus with diabetic neuropathy, unspecified: Secondary | ICD-10-CM | POA: Diagnosis present

## 2017-07-14 DIAGNOSIS — G600 Hereditary motor and sensory neuropathy: Secondary | ICD-10-CM | POA: Diagnosis present

## 2017-07-14 DIAGNOSIS — F329 Major depressive disorder, single episode, unspecified: Secondary | ICD-10-CM | POA: Diagnosis present

## 2017-07-14 DIAGNOSIS — N179 Acute kidney failure, unspecified: Secondary | ICD-10-CM | POA: Diagnosis present

## 2017-07-14 DIAGNOSIS — E781 Pure hyperglyceridemia: Secondary | ICD-10-CM | POA: Diagnosis present

## 2017-07-14 LAB — COMPREHENSIVE METABOLIC PANEL
ALBUMIN: 4 g/dL (ref 3.5–5.0)
ALK PHOS: 115 U/L (ref 38–126)
ALT: 22 U/L (ref 17–63)
AST: 21 U/L (ref 15–41)
Anion gap: 16 — ABNORMAL HIGH (ref 5–15)
BILIRUBIN TOTAL: 1.7 mg/dL — AB (ref 0.3–1.2)
BUN: 17 mg/dL (ref 6–20)
CO2: 17 mmol/L — AB (ref 22–32)
Calcium: 9.1 mg/dL (ref 8.9–10.3)
Chloride: 93 mmol/L — ABNORMAL LOW (ref 101–111)
Creatinine, Ser: 1.31 mg/dL — ABNORMAL HIGH (ref 0.61–1.24)
GFR calc Af Amer: >60 mL/min (ref >60)
GFR calc non Af Amer: >60 mL/min (ref >60)
GLUCOSE: 682 mg/dL — AB (ref 65–99)
POTASSIUM: 4.3 mmol/L (ref 3.5–5.1)
SODIUM: 126 mmol/L — AB (ref 135–145)
TOTAL PROTEIN: 7.1 g/dL (ref 6.5–8.1)

## 2017-07-14 LAB — CBC
HEMATOCRIT: 40 % (ref 39.0–52.0)
Hemoglobin: 14.6 g/dL (ref 13.0–17.0)
MCH: 31.7 pg (ref 26.0–34.0)
MCHC: 36.5 g/dL — AB (ref 30.0–36.0)
MCV: 86.8 fL (ref 78.0–100.0)
Platelets: 236 10*3/uL (ref 150–400)
RBC: 4.61 MIL/uL (ref 4.22–5.81)
RDW: 13.4 % (ref 11.5–15.5)
WBC: 7.5 10*3/uL (ref 4.0–10.5)

## 2017-07-14 LAB — TYPE AND SCREEN
ABO/RH(D): O POS
Antibody Screen: NEGATIVE

## 2017-07-14 LAB — I-STAT TROPONIN, ED: Troponin i, poc: 0.01 ng/mL (ref 0.00–0.08)

## 2017-07-14 NOTE — ED Triage Notes (Signed)
Pt to ED for acute onset CP today - "feels like a band across my nipples and then another band lower than that one." Pt states it's never happened before, felt like acid reflux at first (tried GERD medications without relief), worse with deep inspiration and some movement. Patient states he also has food getting stuck in his esophagus - "it gets stuck and then comes right back up." Also reports dark stools x 2-3 days (black) - no hx of this. Resp e/u, skin warm/dry.

## 2017-07-14 NOTE — ED Triage Notes (Signed)
Patient adds that he's not been taking his insulin lately because he can't afford it.

## 2017-07-15 ENCOUNTER — Observation Stay (HOSPITAL_BASED_OUTPATIENT_CLINIC_OR_DEPARTMENT_OTHER): Payer: BLUE CROSS/BLUE SHIELD

## 2017-07-15 ENCOUNTER — Other Ambulatory Visit: Payer: Self-pay

## 2017-07-15 ENCOUNTER — Encounter (HOSPITAL_COMMUNITY): Payer: Self-pay | Admitting: Internal Medicine

## 2017-07-15 ENCOUNTER — Inpatient Hospital Stay (HOSPITAL_COMMUNITY)
Admission: EM | Admit: 2017-07-15 | Discharge: 2017-07-16 | DRG: 638 | Disposition: A | Discharge status: Home / Self Care | Payer: BLUE CROSS/BLUE SHIELD | Attending: Internal Medicine | Admitting: Internal Medicine

## 2017-07-15 DIAGNOSIS — R945 Abnormal results of liver function studies: Secondary | ICD-10-CM

## 2017-07-15 DIAGNOSIS — E538 Deficiency of other specified B group vitamins: Secondary | ICD-10-CM | POA: Diagnosis present

## 2017-07-15 DIAGNOSIS — F988 Other specified behavioral and emotional disorders with onset usually occurring in childhood and adolescence: Secondary | ICD-10-CM | POA: Diagnosis not present

## 2017-07-15 DIAGNOSIS — K219 Gastro-esophageal reflux disease without esophagitis: Secondary | ICD-10-CM | POA: Diagnosis present

## 2017-07-15 DIAGNOSIS — Z9119 Patient's noncompliance with other medical treatment and regimen: Secondary | ICD-10-CM | POA: Diagnosis not present

## 2017-07-15 DIAGNOSIS — E869 Volume depletion, unspecified: Secondary | ICD-10-CM

## 2017-07-15 DIAGNOSIS — K3189 Other diseases of stomach and duodenum: Secondary | ICD-10-CM | POA: Diagnosis not present

## 2017-07-15 DIAGNOSIS — R0789 Other chest pain: Secondary | ICD-10-CM

## 2017-07-15 DIAGNOSIS — E1110 Type 2 diabetes mellitus with ketoacidosis without coma: Principal | ICD-10-CM

## 2017-07-15 DIAGNOSIS — R1319 Other dysphagia: Secondary | ICD-10-CM

## 2017-07-15 DIAGNOSIS — Y92009 Unspecified place in unspecified non-institutional (private) residence as the place of occurrence of the external cause: Secondary | ICD-10-CM | POA: Diagnosis not present

## 2017-07-15 DIAGNOSIS — Z803 Family history of malignant neoplasm of breast: Secondary | ICD-10-CM | POA: Diagnosis not present

## 2017-07-15 DIAGNOSIS — E785 Hyperlipidemia, unspecified: Secondary | ICD-10-CM | POA: Diagnosis present

## 2017-07-15 DIAGNOSIS — K259 Gastric ulcer, unspecified as acute or chronic, without hemorrhage or perforation: Secondary | ICD-10-CM | POA: Diagnosis not present

## 2017-07-15 DIAGNOSIS — E559 Vitamin D deficiency, unspecified: Secondary | ICD-10-CM | POA: Diagnosis present

## 2017-07-15 DIAGNOSIS — R1310 Dysphagia, unspecified: Secondary | ICD-10-CM

## 2017-07-15 DIAGNOSIS — E781 Pure hyperglyceridemia: Secondary | ICD-10-CM | POA: Diagnosis present

## 2017-07-15 DIAGNOSIS — E1140 Type 2 diabetes mellitus with diabetic neuropathy, unspecified: Secondary | ICD-10-CM | POA: Diagnosis present

## 2017-07-15 DIAGNOSIS — F329 Major depressive disorder, single episode, unspecified: Secondary | ICD-10-CM | POA: Diagnosis present

## 2017-07-15 DIAGNOSIS — N179 Acute kidney failure, unspecified: Secondary | ICD-10-CM | POA: Diagnosis present

## 2017-07-15 DIAGNOSIS — R079 Chest pain, unspecified: Secondary | ICD-10-CM | POA: Diagnosis not present

## 2017-07-15 DIAGNOSIS — E1010 Type 1 diabetes mellitus with ketoacidosis without coma: Secondary | ICD-10-CM

## 2017-07-15 DIAGNOSIS — K2960 Other gastritis without bleeding: Secondary | ICD-10-CM | POA: Diagnosis present

## 2017-07-15 DIAGNOSIS — Z79899 Other long term (current) drug therapy: Secondary | ICD-10-CM | POA: Diagnosis not present

## 2017-07-15 DIAGNOSIS — Z87442 Personal history of urinary calculi: Secondary | ICD-10-CM | POA: Diagnosis not present

## 2017-07-15 DIAGNOSIS — F419 Anxiety disorder, unspecified: Secondary | ICD-10-CM | POA: Diagnosis present

## 2017-07-15 DIAGNOSIS — Z87891 Personal history of nicotine dependence: Secondary | ICD-10-CM | POA: Diagnosis not present

## 2017-07-15 DIAGNOSIS — K209 Esophagitis, unspecified: Secondary | ICD-10-CM | POA: Diagnosis not present

## 2017-07-15 DIAGNOSIS — I10 Essential (primary) hypertension: Secondary | ICD-10-CM | POA: Diagnosis present

## 2017-07-15 DIAGNOSIS — Z791 Long term (current) use of non-steroidal anti-inflammatories (NSAID): Secondary | ICD-10-CM | POA: Diagnosis not present

## 2017-07-15 DIAGNOSIS — Z794 Long term (current) use of insulin: Secondary | ICD-10-CM | POA: Diagnosis not present

## 2017-07-15 DIAGNOSIS — Z8711 Personal history of peptic ulcer disease: Secondary | ICD-10-CM | POA: Diagnosis not present

## 2017-07-15 DIAGNOSIS — G600 Hereditary motor and sensory neuropathy: Secondary | ICD-10-CM | POA: Diagnosis present

## 2017-07-15 DIAGNOSIS — B3781 Candidal esophagitis: Secondary | ICD-10-CM | POA: Diagnosis present

## 2017-07-15 DIAGNOSIS — G6181 Chronic inflammatory demyelinating polyneuritis: Secondary | ICD-10-CM | POA: Diagnosis present

## 2017-07-15 DIAGNOSIS — F909 Attention-deficit hyperactivity disorder, unspecified type: Secondary | ICD-10-CM | POA: Diagnosis present

## 2017-07-15 HISTORY — DX: Chronic inflammatory demyelinating polyneuritis: G61.81

## 2017-07-15 HISTORY — DX: Vitamin D deficiency, unspecified: E55.9

## 2017-07-15 LAB — ECHOCARDIOGRAM COMPLETE
Area-P 1/2: 2.97 cm2
CHL CUP DOP CALC LVOT VTI: 23.7 cm
E/e' ratio: 8.7
EWDT: 254 ms
FS: 44 % (ref 28–44)
HEIGHTINCHES: 76 in
IVS/LV PW RATIO, ED: 1
LA ID, A-P, ES: 38 mm
LA diam end sys: 38 mm
LA vol index: 29.6 mL/m2
LA vol: 72.1 mL
LADIAMINDEX: 1.56 cm/m2
LAVOLA4C: 80.8 mL
LV E/e' medial: 8.7
LV E/e'average: 8.7
LV TDI E'LATERAL: 9.79
LV dias vol index: 40 mL/m2
LV e' LATERAL: 9.79 cm/s
LVDIAVOL: 97 mL (ref 62–150)
LVOT SV: 116 mL
LVOT area: 4.91 cm2
LVOT peak vel: 102 cm/s
LVOTD: 25 mm
MV Dec: 254
MV Peak grad: 3 mmHg
MVPKAVEL: 67.5 m/s
MVPKEVEL: 85.2 m/s
P 1/2 time: 74 ms
PW: 10 mm — AB (ref 0.6–1.1)
RV LATERAL S' VELOCITY: 11.7 cm/s
RV sys press: 29 mmHg
Reg peak vel: 230 cm/s
TAPSE: 20.3 mm
TDI e' medial: 8.05
TRMAXVEL: 230 cm/s
WEIGHTICAEL: 3841.3 [oz_av]

## 2017-07-15 LAB — BASIC METABOLIC PANEL
ANION GAP: 11 (ref 5–15)
ANION GAP: 9 (ref 5–15)
ANION GAP: 8 (ref 5–15)
BUN: 12 mg/dL (ref 6–20)
BUN: 10 mg/dL (ref 6–20)
BUN: 8 mg/dL (ref 6–20)
CALCIUM: 8.3 mg/dL — AB (ref 8.9–10.3)
CALCIUM: 8.1 mg/dL — AB (ref 8.9–10.3)
CALCIUM: 8.1 mg/dL — AB (ref 8.9–10.3)
CO2: 21 mmol/L — ABNORMAL LOW (ref 22–32)
CO2: 23 mmol/L (ref 22–32)
CO2: 23 mmol/L (ref 22–32)
Chloride: 99 mmol/L — ABNORMAL LOW (ref 101–111)
Chloride: 100 mmol/L — ABNORMAL LOW (ref 101–111)
Chloride: 99 mmol/L — ABNORMAL LOW (ref 101–111)
Creatinine, Ser: 0.93 mg/dL (ref 0.61–1.24)
Creatinine, Ser: 0.82 mg/dL (ref 0.61–1.24)
Creatinine, Ser: 0.87 mg/dL (ref 0.61–1.24)
GFR calc Af Amer: >60 mL/min (ref >60)
GFR calc Af Amer: >60 mL/min (ref >60)
GFR calc Af Amer: >60 mL/min (ref >60)
GLUCOSE: 234 mg/dL — AB (ref 65–99)
GLUCOSE: 210 mg/dL — AB (ref 65–99)
GLUCOSE: 286 mg/dL — AB (ref 65–99)
POTASSIUM: 3.5 mmol/L (ref 3.5–5.1)
POTASSIUM: 3.4 mmol/L — AB (ref 3.5–5.1)
POTASSIUM: 3.5 mmol/L (ref 3.5–5.1)
SODIUM: 132 mmol/L — AB (ref 135–145)
SODIUM: 130 mmol/L — AB (ref 135–145)
Sodium: 131 mmol/L — ABNORMAL LOW (ref 135–145)

## 2017-07-15 LAB — GLUCOSE, CAPILLARY
GLUCOSE-CAPILLARY: 133 mg/dL — AB (ref 65–99)
GLUCOSE-CAPILLARY: 165 mg/dL — AB (ref 65–99)
GLUCOSE-CAPILLARY: 165 mg/dL — AB (ref 65–99)
GLUCOSE-CAPILLARY: 200 mg/dL — AB (ref 65–99)
GLUCOSE-CAPILLARY: 349 mg/dL — AB (ref 65–99)
GLUCOSE-CAPILLARY: 265 mg/dL — AB (ref 65–99)
GLUCOSE-CAPILLARY: 302 mg/dL — AB (ref 65–99)
Glucose-Capillary: 208 mg/dL — ABNORMAL HIGH (ref 65–99)
Glucose-Capillary: 160 mg/dL — ABNORMAL HIGH (ref 65–99)

## 2017-07-15 LAB — CBG MONITORING, ED
GLUCOSE-CAPILLARY: 314 mg/dL — AB (ref 65–99)
GLUCOSE-CAPILLARY: 300 mg/dL — AB (ref 65–99)
GLUCOSE-CAPILLARY: 262 mg/dL — AB (ref 65–99)
GLUCOSE-CAPILLARY: 164 mg/dL — AB (ref 65–99)
Glucose-Capillary: 461 mg/dL — ABNORMAL HIGH (ref 65–99)
Glucose-Capillary: 157 mg/dL — ABNORMAL HIGH (ref 65–99)

## 2017-07-15 LAB — HEMOGLOBIN A1C
HEMOGLOBIN A1C: 11.6 % — AB (ref 4.8–5.6)
MEAN PLASMA GLUCOSE: 286.22 mg/dL

## 2017-07-15 LAB — URINALYSIS, ROUTINE W REFLEX MICROSCOPIC
BILIRUBIN URINE: NEGATIVE
Bacteria, UA: NONE SEEN
Glucose, UA: >=500 mg/dL — AB
HGB URINE DIPSTICK: NEGATIVE
Ketones, ur: 20 mg/dL — AB
Leukocytes, UA: NEGATIVE
NITRITE: NEGATIVE
PH: 5 (ref 5.0–8.0)
Protein, ur: NEGATIVE mg/dL
RBC / HPF: NONE SEEN RBC/hpf (ref 0–5)
SPECIFIC GRAVITY, URINE: 1.031 — AB (ref 1.005–1.030)
Squamous Epithelial / LPF: NONE SEEN

## 2017-07-15 LAB — HIV ANTIBODY (ROUTINE TESTING W REFLEX): HIV SCREEN 4TH GENERATION: NONREACTIVE

## 2017-07-15 LAB — TROPONIN I
Troponin I: <0.03 ng/mL (ref <0.03)
Troponin I: <0.03 ng/mL (ref <0.03)

## 2017-07-15 LAB — CBC
HCT: 36.1 % — ABNORMAL LOW (ref 39.0–52.0)
HEMOGLOBIN: 12.8 g/dL — AB (ref 13.0–17.0)
MCH: 30.4 pg (ref 26.0–34.0)
MCHC: 35.5 g/dL (ref 30.0–36.0)
MCV: 85.7 fL (ref 78.0–100.0)
Platelets: 207 10*3/uL (ref 150–400)
RBC: 4.21 MIL/uL — ABNORMAL LOW (ref 4.22–5.81)
RDW: 13.4 % (ref 11.5–15.5)
WBC: 6.4 10*3/uL (ref 4.0–10.5)

## 2017-07-15 LAB — I-STAT TROPONIN, ED: Troponin i, poc: 0.01 ng/mL (ref 0.00–0.08)

## 2017-07-15 LAB — LIPASE, BLOOD: Lipase: 42 U/L (ref 11–51)

## 2017-07-15 LAB — MRSA PCR SCREENING: MRSA BY PCR: NEGATIVE

## 2017-07-15 LAB — POC OCCULT BLOOD, ED: Fecal Occult Bld: NEGATIVE

## 2017-07-15 MED ORDER — POTASSIUM CHLORIDE 10 MEQ/100ML IV SOLN
10.0000 meq | INTRAVENOUS | Status: AC
  Administered 2017-07-15 (×2): 10 meq via INTRAVENOUS
  Filled 2017-07-15 (×2): qty 100

## 2017-07-15 MED ORDER — SODIUM CHLORIDE 0.9 % IV SOLN
INTRAVENOUS | Status: DC
  Administered 2017-07-15: 4 [IU]/h via INTRAVENOUS
  Filled 2017-07-15: qty 1

## 2017-07-15 MED ORDER — DEXTROSE-NACL 5-0.45 % IV SOLN
INTRAVENOUS | Status: DC
  Administered 2017-07-15: 06:00:00 via INTRAVENOUS

## 2017-07-15 MED ORDER — ZOLPIDEM TARTRATE 5 MG PO TABS
5.0000 mg | ORAL_TABLET | Freq: Every evening | ORAL | Status: DC | PRN
  Administered 2017-07-15: 5 mg via ORAL
  Filled 2017-07-15: qty 1

## 2017-07-15 MED ORDER — GABAPENTIN 400 MG PO CAPS
800.0000 mg | ORAL_CAPSULE | Freq: Three times a day (TID) | ORAL | Status: DC
  Administered 2017-07-15 – 2017-07-16 (×4): 800 mg via ORAL
  Filled 2017-07-15 (×6): qty 2

## 2017-07-15 MED ORDER — ENOXAPARIN SODIUM 40 MG/0.4ML ~~LOC~~ SOLN: 40.0000 mg | SUBCUTANEOUS | Status: DC

## 2017-07-15 MED ORDER — INSULIN ASPART 100 UNIT/ML ~~LOC~~ SOLN
0.0000 [IU] | Freq: Three times a day (TID) | SUBCUTANEOUS | Status: DC
  Administered 2017-07-15 – 2017-07-16 (×3): 5 [IU] via SUBCUTANEOUS

## 2017-07-15 MED ORDER — ONDANSETRON HCL 4 MG/2ML IJ SOLN
4.0000 mg | Freq: Once | INTRAMUSCULAR | Status: AC
  Administered 2017-07-15: 4 mg via INTRAVENOUS
  Filled 2017-07-15: qty 2

## 2017-07-15 MED ORDER — SODIUM CHLORIDE 0.9 % IV SOLN
INTRAVENOUS | Status: DC
  Administered 2017-07-15: 03:00:00 via INTRAVENOUS

## 2017-07-15 MED ORDER — SODIUM CHLORIDE 0.9 % IV BOLUS (SEPSIS)
1000.0000 mL | Freq: Once | INTRAVENOUS | Status: AC
  Administered 2017-07-15: 1000 mL via INTRAVENOUS

## 2017-07-15 MED ORDER — INSULIN GLARGINE 100 UNIT/ML ~~LOC~~ SOLN
20.0000 [IU] | Freq: Two times a day (BID) | SUBCUTANEOUS | Status: DC
  Administered 2017-07-15 (×2): 20 [IU] via SUBCUTANEOUS
  Filled 2017-07-15 (×3): qty 0.2

## 2017-07-15 MED ORDER — SODIUM CHLORIDE 0.9 % IV SOLN
INTRAVENOUS | Status: AC
  Filled 2017-07-15: qty 1

## 2017-07-15 MED ORDER — SODIUM CHLORIDE 0.9 % IV SOLN: INTRAVENOUS | Status: DC

## 2017-07-15 MED ORDER — GI COCKTAIL ~~LOC~~
30.0000 mL | Freq: Once | ORAL | Status: AC
  Administered 2017-07-15: 30 mL via ORAL
  Filled 2017-07-15: qty 30

## 2017-07-15 MED ORDER — POTASSIUM CHLORIDE IN NACL 20-0.9 MEQ/L-% IV SOLN
INTRAVENOUS | Status: DC
  Administered 2017-07-15 – 2017-07-16 (×2): via INTRAVENOUS
  Filled 2017-07-15 (×3): qty 1000

## 2017-07-15 MED ORDER — ASPIRIN 325 MG PO TABS
325.0000 mg | ORAL_TABLET | Freq: Every day | ORAL | Status: DC
  Administered 2017-07-15 – 2017-07-16 (×2): 325 mg via ORAL
  Filled 2017-07-15 (×2): qty 1

## 2017-07-15 MED ORDER — PANTOPRAZOLE SODIUM 40 MG IV SOLR
40.0000 mg | INTRAVENOUS | Status: DC
  Administered 2017-07-15: 40 mg via INTRAVENOUS
  Filled 2017-07-15: qty 40

## 2017-07-15 MED ORDER — AMPHETAMINE SULFATE 10 MG PO TABS
20.0000 mg | ORAL_TABLET | Freq: Two times a day (BID) | ORAL | Status: DC
  Administered 2017-07-15: 20 mg via ORAL
  Filled 2017-07-15 (×2): qty 2

## 2017-07-15 MED ORDER — INSULIN ASPART 100 UNIT/ML ~~LOC~~ SOLN: 0.0000 [IU] | Freq: Three times a day (TID) | SUBCUTANEOUS | Status: DC

## 2017-07-15 MED ORDER — PANTOPRAZOLE SODIUM 40 MG PO TBEC
40.0000 mg | DELAYED_RELEASE_TABLET | Freq: Once | ORAL | Status: AC
  Administered 2017-07-15: 40 mg via ORAL
  Filled 2017-07-15: qty 1

## 2017-07-15 NOTE — ED Provider Notes (Signed)
TIME SEEN: 1:21 AM  CHIEF COMPLAINT: Chest pain, hyperglycemia  HPI: Patient is a 52 year old male with history of insulin-dependent diabetes, hyperlipidemia, gastric ulcer, pancreatitis who presents to the emergency department with complaints of several days of upper abdominal pain and chest pain.  States that it feels similar to acid reflux and is painful to swallow and worse after eating.  Has tried Tums and Pepto-Bismol without relief.  States that he started noticing black stool as well and talk to his sister who is a pediatrician and recommended he come to the hospital.  States there is some pain with deep inspiration.  No history of PE or DVT.  Pain is nonexertional.  Also reports that his blood sugar has been in the 600s.  States he has not had any NovoLog or Lantus for the past month and a half.  States he is supposed to be on sliding scale NovoLog daily and 70 units of Lantus at night.  No shortness of breath with his chest pain but has had nausea and vomiting.  No bright red blood per rectum.  ROS: See HPI Constitutional: no fever  Eyes: no drainage  ENT: no runny nose   Cardiovascular:   chest pain  Resp: no SOB  GI:  vomiting GU: no dysuria Integumentary: no rash  Allergy: no hives  Musculoskeletal: no leg swelling  Neurological: no slurred speech ROS otherwise negative  PAST MEDICAL HISTORY/PAST SURGICAL HISTORY:  Past Medical History:  Diagnosis Date  . ADHD (attention deficit hyperactivity disorder)   . Anxiety   . Anxiety and depression   . Depression   . Diabetes type 2, controlled (HCC)   . Diverticulosis   . Hyperlipidemia   . Kidney stones   . Pancreatitis   . Stomach ulcer     MEDICATIONS:  Prior to Admission medications   Medication Sig Start Date End Date Taking? Authorizing Provider  acetaminophen (TYLENOL) 325 MG tablet Take 325-650 mg by mouth every 6 (six) hours as needed (for pain or headaches).   Yes [provider]  Amphetamine Sulfate  (EVEKEO) 10 MG TABS Take 20 mg by mouth 2 (two) times daily.    Yes [provider]  diphenhydramine-acetaminophen (TYLENOL PM) 25-500 MG TABS tablet Take 1-2 tablets by mouth at bedtime as needed (for sleep).   Yes [provider]  gabapentin (NEURONTIN) 800 MG tablet Take 800 mg by mouth 3 (three) times daily.   Yes [provider]  ibuprofen (ADVIL,MOTRIN) 200 MG tablet Take 200-600 mg by mouth every 6 (six) hours as needed (for pain or headaches).    Yes [provider]  ramipril (ALTACE) 2.5 MG capsule Take 2.5 mg by mouth daily.    Yes [provider]  zolpidem (AMBIEN) 10 MG tablet Take 10 mg by mouth at bedtime as needed for sleep.    Yes [provider]  meloxicam (MOBIC) 15 MG tablet Take one a day for 10 days then take as needed Patient not taking: Reported on 07/14/2017 08/31/15   Briscoe Deutscher, DO  nitroGLYCERIN [facility]) 0.2 mg/hr patch Place 1/4 patch to affected area daily Patient not taking: Reported on 07/14/2017 08/31/15   Briscoe Deutscher, DO    ALLERGIES:  No Known Allergies  SOCIAL HISTORY:  Social History   Tobacco Use  . Smoking status: Former Games developer  . Smokeless tobacco: Former Engineer, water Use Topics  . Alcohol use: No    Comment: Socially    FAMILY HISTORY: Family History  Problem Relation Age of Onset  . Stroke Father   . Breast cancer Mother   . Colon cancer Neg Hx     EXAM: BP 114/86 (BP Location: Left Arm)   Pulse 89   Temp 98.6 F (37 C) (Oral)   Resp 18   Ht 6\' 4"  (1.93 m)   Wt 106.6 kg (235 lb)   SpO2 96%   BMI 28.61 kg/m  CONSTITUTIONAL: Alert and oriented and responds appropriately to questions. Well-appearing; well-nourished HEAD: Normocephalic EYES: Conjunctivae clear, pupils appear equal, EOMI ENT: normal nose; moist mucous membranes NECK: Supple, no meningismus, no nuchal rigidity, no LAD  CARD: RRR; S1 and S2 appreciated; no murmurs, no clicks, no rubs, no gallops RESP:  Normal chest excursion without splinting or tachypnea; breath sounds clear and equal bilaterally; no wheezes, no rhonchi, no rales, no hypoxia or respiratory distress, speaking full sentences ABD/GI: Normal bowel sounds; non-distended; soft, non-tender, no rebound, no guarding, no peritoneal signs, no hepatosplenomegaly RECTAL:  Normal rectal tone, no gross blood or melena, stool is dark in appearance, guaiac negative, no hemorrhoids appreciated, nontender rectal exam, no fecal impaction BACK:  The back appears normal and is non-tender to palpation, there is no CVA tenderness EXT: Normal ROM in all joints; non-tender to palpation; no edema; normal capillary refill; no cyanosis, no calf tenderness or swelling    SKIN: Normal color for age and race; warm; no rash NEURO: Moves all extremities equally PSYCH: The patient's mood and manner are appropriate. Grooming and personal hygiene are appropriate.  MEDICAL DECISION MAKING: Patient here with atypical chest pain.  Suspect GERD, esophagitis, gastritis.  Abdominal exam is benign.  LFTs show mildly elevated total bilirubin but no right upper quadrant tenderness.  We will add on a lipase.  He does appear to be in DKA.  He has bicarb of 17 with elevated anion gap.  We will start IV fluids and IV insulin.  Patient will need admission.  First troponin negative.  I do not think this is ACS, PE or dissection.  EKG shows no ischemic change.  Will discuss with medicine for admission.  ED PROGRESS: Patient second troponin is negative.  2:38 AM Discussed patient's case with hospitalist, Dr. Toniann Fail.  I have recommended admission and patient (and family if present) agree with this plan. Admitting physician will place admission orders.   I reviewed all nursing notes, vitals, pertinent previous records, EKGs, lab and urine results, imaging (as available).       EKG Interpretation  Date/Time:  Tuesday July 14 2017 19:27:56 EST Ventricular Rate:  99 PR  Interval:  154 QRS Duration: 86 QT Interval:  338 QTC Calculation: 433 R Axis:   -9 Text Interpretation:  Normal sinus rhythm Minimal voltage criteria for LVH, may be normal variant Nonspecific ST and T wave abnormality Abnormal ECG When compared with ECG of 07/16/2016, No significant change was found Confirmed by Dione Booze ([id number]) on 07/14/2017 11:47:41 PM       CRITICAL CARE Performed by: Baxter Hire Mckinze Poirier   Total critical care time: 45 minutes  Critical care time was exclusive of separately billable procedures and treating other patients.  Critical care was necessary to treat or prevent imminent or life-threatening deterioration.  Critical care was time spent personally by me on the following activities: development of treatment plan with patient and/or surrogate as well as nursing, discussions with consultants, evaluation of patient's response to treatment, examination of patient, obtaining history from patient or surrogate, ordering and performing treatments and  interventions, ordering and review of laboratory studies, ordering and review of radiographic studies, pulse oximetry and re-evaluation of patient's condition.     Parnell Spieler, Layla MawKristen N, DO 07/15/17 [phone]

## 2017-07-15 NOTE — Progress Notes (Signed)
PROGRESS NOTE    [name]  WUJ:[id number] DOB: [birth year] DOA: 07/15/2017 PCP: Ananias PilgrimAsres, Alehegn, MD  Outpatient Specialists:  Brief Narrative: Patient is a 52 year old Caucasian male with past medical history significant for diabetes mellitus type 2, ADD, neuropathy compliance with medication.  According to the patient, she has not been able to afford these insulin for a couple of months.  Few days prior to presentation, the patient developed dysphagia and odynophagia/chest pain, and on presentation to the hospital, the patient was found to be in diabetes ketoacidosis.  The blood sugar was 682 with an anion gap of 16.  Serum creatinine was also 1.4.  The patient was admitted and started on treatment for diabetes ketoacidosis.  GI team has been consulted for the dysphagia and odynophagia, and patient will likely proceed with EGD.  Troponin has been negative.  Assessment & Plan:   Principal Problem:   DKA, type 2 (HCC) Active Problems:   Chest pain   ADD (attention deficit disorder)   Diabetes ketoacidosis: DKA has resolved.  Anion gap is within normal limits. We will start patient on subcutaneous Lantus insulin. Turner off insulin drip after 2 hours of starting  subcutaneous Lantus. Continue to monitor blood sugar closely. Continue hydration. Continue to monitor renal function and electrolytes. Consult diabetic educator. Consult Child psychotherapistsocial worker as the patient is financially challenged, and has not been able to afford his insulin for about 2 months.  HbA1c is 11.6. Need to comply with diabetes management discussed with the patient extensively.  Acute kidney injury: This has resolved. This is most likely secondary to volume depletion. Cerium creatinine has gone from 1.31 to 1.01.  Volume depletion: This has resolved.  Chest pain: Troponin has been negative. Etiology seems likely GI. We will consult GI team for possible EGD.  Dysphagia/odynophagia: See above. I with GI  input.  Hypertension: Continue to optimize. Will monitor closely while the patient is in the hospital  Noncompliance: Discussed need to comply with diabetes management with the patient extensively.  Abnormal electrolytes: We will continue to monitor and correct accordingly.   I personally reviewed patient's laboratory data and chest x-ray.  DVT prophylaxis: Subcu Lovenox. Code Status: Full. Family Communication: None Disposition Plan: Home eventually   Consultants:   GI  Diabetes educator  Procedures:   None  Antimicrobials:   None   Subjective: Chest pain is improving. Patient feels a bit better. The patient is now back to the baseline.  Objective: Vitals:   07/15/17 0545 07/15/17 0615 07/15/17 0655 07/15/17 0730  BP: (!) 137/54 132/69 128/84 123/87  Pulse: 76 70 72 (!) 51  Resp: 17 17 11 11   Temp:   97.9 F (36.6 C) 97.9 F (36.6 C)  TempSrc:   Oral Oral  SpO2: 95% 93% 99% 97%  Weight:   108.9 kg (240 lb 1.3 oz)   Height:   6\' 4"  (1.93 m)     Intake/Output Summary (Last 24 hours) at 07/15/2017 0842 Last data filed at 07/15/2017 0431 Gross per 24 hour  Intake 2211.45 ml  Output 900 ml  Net 1311.45 ml   Filed Weights   07/14/17 1928 07/15/17 0655  Weight: 106.6 kg (235 lb) 108.9 kg (240 lb 1.3 oz)    Examination:  General exam: Appears calm and comfortable  Respiratory system: Clear to auscultation. Respiratory effort normal. Cardiovascular system: S1 & S2 heard. No pedal edema. Gastrointestinal system: Abdomen is nondistended, soft and nontender. No organomegaly or masses felt. Normal bowel sounds heard. Central  nervous system: Alert and oriented. No focal neurological deficits. Extremities: Symmetric 5 x 5 power.  Data Reviewed: I have personally reviewed following labs and imaging studies  CBC: Recent Labs  Lab 07/14/17 1944 07/15/17 0414  WBC 7.5 6.4  HGB 14.6 12.8*  HCT 40.0 36.1*  MCV 86.8 85.7  PLT 236 207   Basic  Metabolic Panel: Recent Labs  Lab 07/14/17 1944 07/15/17 0414  NA 126* 131*  K 4.3 3.5  CL 93* 99*  CO2 17* 21*  GLUCOSE 682* 234*  BUN 17 12  CREATININE 1.31* 0.93  CALCIUM 9.1 8.3*   GFR: Estimated Creatinine Clearance: 124.2 mL/min (by C-G formula based on SCr of 0.93 mg/dL). Liver Function Tests: Recent Labs  Lab 07/14/17 1944  AST 21  ALT 22  ALKPHOS 115  BILITOT 1.7*  PROT 7.1  ALBUMIN 4.0   Recent Labs  Lab 07/15/17 0130  LIPASE 42   No results for input(s): AMMONIA in the last 168 hours. Coagulation Profile: No results for input(s): INR, PROTIME in the last 168 hours. Cardiac Enzymes: Recent Labs  Lab 07/15/17 0414  TROPONINI <0.03   BNP (last 3 results) No results for input(s): PROBNP in the last 8760 hours. HbA1C: Recent Labs    07/15/17 0414  HGBA1C 11.6*   CBG: Recent Labs  Lab 07/15/17 0225 07/15/17 0326 07/15/17 0428 07/15/17 0535 07/15/17 0632  GLUCAP 314* 300* 262* 164* 157*   Lipid Profile: No results for input(s): CHOL, HDL, LDLCALC, TRIG, CHOLHDL, LDLDIRECT in the last 72 hours. Thyroid Function Tests: No results for input(s): TSH, T4TOTAL, FREET4, T3FREE, THYROIDAB in the last 72 hours. Anemia Panel: No results for input(s): VITAMINB12, FOLATE, FERRITIN, TIBC, IRON, RETICCTPCT in the last 72 hours. Urine analysis:    Component Value Date/Time   COLORURINE STRAW (A) 07/15/2017 0112   APPEARANCEUR CLEAR 07/15/2017 0112   LABSPEC 1.031 (H) 07/15/2017 0112   PHURINE 5.0 07/15/2017 0112   GLUCOSEU >=500 (A) 07/15/2017 0112   HGBUR NEGATIVE 07/15/2017 0112   BILIRUBINUR NEGATIVE 07/15/2017 0112   KETONESUR 20 (A) 07/15/2017 0112   PROTEINUR NEGATIVE 07/15/2017 0112   NITRITE NEGATIVE 07/15/2017 0112   LEUKOCYTESUR NEGATIVE 07/15/2017 0112   Sepsis Labs: @LABRCNTIP (procalcitonin:4,lacticidven:4)  )No results found for this or any previous visit (from the past 240 hour(s)).       Radiology Studies: Dg Chest 2  View  Result Date: 07/14/2017 CLINICAL DATA:  Chest pain EXAM: CHEST  2 VIEW COMPARISON:  08/04/2006 FINDINGS: Lingular scarring, stable. Right lung clear. Heart is normal size. No effusions or acute bony abnormality. IMPRESSION: Lingular scarring.  No active disease. Electronically Signed   By: Charlett Nose M.D.   On: 07/14/2017 20:35        Scheduled Meds: . Amphetamine Sulfate  20 mg Oral BID  . aspirin  325 mg Oral Daily  . enoxaparin (LOVENOX) injection  40 mg Subcutaneous Q24H  . gabapentin  800 mg Oral TID  . insulin glargine  20 Units Subcutaneous BID  . pantoprazole (PROTONIX) IV  40 mg Intravenous Q24H   Continuous Infusions: . sodium chloride Stopped (07/15/17 0437)  . sodium chloride 125 mL/hr at 07/15/17 0437  . dextrose 5 % and 0.45% NaCl 100 mL/hr at 07/15/17 0541  . insulin (NOVOLIN-R) infusion 2.1 Units/hr (07/15/17 0834)     LOS: 0 days    Time spent: 45 minutes    Berton Mount, MD  [facility] Pager #: [phone] 7PM-7AM contact night coverage as  above

## 2017-07-15 NOTE — H&P (View-Only) (Signed)
[facility] Consult: 10:46 AM 07/15/2017  LOS: 0 days    Referring Provider: Dr Holli Humbles.    Primary Care Physician:  Ananias Pilgrim, MD in [facility] Primary Gastroenterologist:  unassigned    IMPRESSION:   *    Odyno/dysphagia.   Rule out Candida dial esophagitis.  Rule out GERD.  Rule out neoplasia.  *   Poorly controlled type II IDDM.  Ran out of Insulin 6 weeks ago and unable to afford.  Admitted with DKA.  On insulin drip.  *    Atypical chest pain, symptoms do not sound cardiac.  Thus far troponins are negative.  Nonspecific ST changes.  2D echo performed within the last hour, results pending.  *   Mild elevation of total bilirubin with otherwise normal LFTs.  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   2012 hx acute pancreatitis from hyper triglycerides and poorly controlled DM.   Lipase normal.      PLAN:     *   Allow full liquid/carb mod diet today.  Arrange for upper endoscopy tomorrow.  We will proceed with this unless cardiac or other medical issues force postponement.  Continue the once daily Protonix.  Obtain hepatic function profile which will give a fractionated bilirubin level,  I stopped the Lovenox due this afternoon.  This will give Dr. Concha Se the option to dilate the patient's esophagus if needed. will resume tomorrow.  Ordered PAS stockings.      [facility] Attending   I have taken an interval history, reviewed the chart and examined the patient. I agree with the Advanced Practitioner's note, impression and recommendations.   EGD tomorrow - ? Candida, GERD, other.  The risks and benefits as well as alternatives of endoscopic procedure(s) have been discussed and reviewed. All questions answered. The patient agrees to proceed.  [name], MD, [facility] [phone]  (pager) 07/15/2017 6:53 PM     +++++++++++++++++++++++++++++++++++++++++++++++++++++++++++++++++++++++++++  Reason for Consultation:  Dysphagia, odynophagia, weight loss.      HPI: [name] is a 52 y.o. male.  Type 2 insulin requiring DM, historically poorly controlled.  He was diagnosed in approximately 2008 and began requiring insulin around 2012.    Depression.  ADHD. Severe, acute pancreatitis 10/2010 due to poorly controlled DM and hypertriglyceridemia.   Charcot -Marie disease.  Peripheral neuropathy.  B12 deficiency.   Patient has never undergone upper endoscopy or colonoscopy.  20 or more years ago he was diagnosed with PUD by upper GI series.  He does not use PPI or H2 blocker.   Beginning in early 06/2017, patient was unable to afford his insulin.  He got some samples from his physician but these ran out 3 weeks ago.  So essentially, for the last month, he has rarely used insulin. Since late last week having non-exertional chest discomfort as well as a sense of solids/liquids catching in his mid sternum.  Previously had occasional heartburn that began to be more frequent in recent weeks.  The discomfort is specially triggered by p.o. intake, liquids as well as solids.  Yesterday  he had an episode where the pain radiated across his chest, at the nipple level.  When he tried to swallow solid food (rice, burger) would not go down and he ended up having to regurgitate it back up. He is able to tolerate liquids and pain improved today.    Glucose 682.  Hgb A1C 11.6 (mean plasma glucose 286).   Troponin I negative x 4.   T bili 1.4 >> 1.7 but otherwise normal LFTs. Head CT negative.  CXR negative.    Patient was admitted for management of DKA and typical chest pain.  On insulin drip.  Daily PO Protonix started  Patient reports weight dropped from 250 -240 # over the last 4-6 weeks. Family Hx + for breast CA in mom and Dad died of either CVA or MI.  Dad probably had GERD.  No hx colon  cancer.    Past Medical History:  Diagnosis Date  . ADHD (attention deficit hyperactivity disorder)   . Anxiety and depression   . CIDP (chronic inflammatory demyelinating polyneuropathy) (HCC)   . Depression   . Diabetes type 2, controlled (HCC)   . Diverticulosis   . Hyperlipidemia   . Kidney stones   . Pancreatitis   . Stomach ulcer   . Vitamin D deficiency     Past Surgical History:  Procedure Laterality Date  . ANKLE SURGERY    . CERVICAL DISC SURGERY     C4-C5  . ELBOW SURGERY    . RETINAL DETACHMENT SURGERY    . RHINOPLASTY    . VASECTOMY      Prior to Admission medications   Medication Sig Start Date End Date Taking? Authorizing Provider  acetaminophen (TYLENOL) 325 MG tablet Take 325-650 mg by mouth every 6 (six) hours as needed (for pain or headaches).   Yes [provider]  Amphetamine Sulfate (EVEKEO) 10 MG TABS Take 20 mg by mouth 2 (two) times daily.    Yes [provider]  diphenhydramine-acetaminophen (TYLENOL PM) 25-500 MG TABS tablet Take 1-2 tablets by mouth at bedtime as needed (for sleep).   Yes [provider]  gabapentin (NEURONTIN) 800 MG tablet Take 800 mg by mouth 3 (three) times daily.   Yes [provider]  ibuprofen (ADVIL,MOTRIN) 200 MG tablet Take 200-600 mg by mouth every 6 (six) hours as needed (for pain or headaches).    Yes [provider]  ramipril (ALTACE) 2.5 MG capsule Take 2.5 mg by mouth daily.    Yes [provider]  zolpidem (AMBIEN) 10 MG tablet Take 10 mg by mouth at bedtime as needed for sleep.    Yes [provider]  meloxicam (MOBIC) 15 MG tablet Take one a day for 10 days then take as needed Patient not taking: Reported on 07/14/2017 08/31/15   Briscoe Deutscher, DO  nitroGLYCERIN [facility]) 0.2 mg/hr patch Place 1/4 patch to affected area daily Patient not taking: Reported on 07/14/2017 08/31/15   Briscoe Deutscher, DO    Scheduled Meds: . Amphetamine Sulfate  20 mg Oral  BID  . aspirin  325 mg Oral Daily  . enoxaparin (LOVENOX) injection  40 mg Subcutaneous Q24H  . gabapentin  800 mg Oral TID  . insulin glargine  20 Units Subcutaneous BID  . pantoprazole (PROTONIX) IV  40 mg Intravenous Q24H   Infusions: . sodium chloride Stopped (07/15/17 0437)  . sodium chloride 125 mL/hr at 07/15/17 0437  . dextrose 5 % and 0.45% NaCl 100 mL/hr at  07/15/17 0541  . insulin (NOVOLIN-R) infusion 2.1 Units/hr (07/15/17 0834)   PRN Meds:    Allergies as of 07/14/2017  . (No Known Allergies)    Family History  Problem Relation Age of Onset  . Stroke Father   . Breast cancer Mother   . Colon cancer Neg Hx     Social History   Socioeconomic History  . Marital status: Single    Spouse name: Not on file  . Number of children: 3  . Years of education: Not on file  . Highest education level: Not on file  Social Needs  . Financial resource strain: Not on file  . Food insecurity - worry: Not on file  . Food insecurity - inability: Not on file  . Transportation needs - medical: Not on file  . Transportation needs - non-medical: Not on file  Occupational History  . Occupation: Market researcher: [employer]  Tobacco Use  . Smoking status: Former Games developer  . Smokeless tobacco: Former Engineer, water and Sexual Activity  . Alcohol use: Yes    Comment: Socially  . Drug use: No  . Sexual activity: Not on file  Other Topics Concern  . Not on file  Social History Narrative  . Not on file    REVIEW OF SYSTEMS: Constitutional: No profound fatigue or weakness. ENT:  No nose bleeds Pulm: No cough, no dyspnea. CV:  No palpitations, no LE edema.  GU:  No hematuria, no frequency GI:  Per HPI Heme: Denies unusual bleeding or bruising tendency. It has been a while since he had any B12 shots.  He is never had to take iron supplements. Transfusions: None ever Neuro: No seizures.  Has numbness/tingling in his feet. Ortho: Some discomfort in his feet and  feels like his arches are collapsing. Derm:  No itching, no rash or sores.  Endocrine:  No sweats or chills.  No polyuria or dysuria Immunization: Did not inquire as to recent immunizations. Travel:  None beyond local counties in last few months.    PHYSICAL EXAM: Vital signs in last 24 hours: Vitals:   07/15/17 0900 07/15/17 1000  BP: 126/73 136/83  Pulse: 63 (!) 35  Resp: 17 18  Temp:    SpO2:     Wt Readings from Last 3 Encounters:  07/15/17 108.9 kg (240 lb 1.3 oz)  01/18/16 111.1 kg (245 lb)  08/31/15 111.1 kg (245 lb)    General: Pleasant, well-appearing, alert, comfortable WM. Head: No facial asymmetry or swelling.  No signs of head trauma. Eyes: No scleral icterus.  No conjunctival pallor.  EOMI. Ears: Not hard of hearing. Nose: No discharge or congestion. Mouth: Tongue midline.  Good dentition.  Moist, pink, clear mucosa.  No signs of ulcers or Candida. Neck: No JVD, masses, thyromegaly.  Lungs:    Clear bilaterally.  No cough, no labored breathing. Heart: RRR.  No MRG.  S1, S2 present. Abdomen: Soft.  Not tender or distended.  No masses, no HSM, no hernias, no bruits..   Rectal: Did not perform. Musc/Skeltl: No obvious joint deformities, redness or swelling.  Despite the diagnosis of Charcot Hilda Lias disease, his feet do not look deformed. Extremities: No CCE.  No ulcers on the feet.  No palmar erythema. Neurologic: Alert.  Oriented x3.  Moves all 4 limbs.  No weakness, no tremor.  Grossly neurologically intact. Skin:  No palmar erythema.  No telangiectasia.  No suspicious lesions or rash. Tattoos: None seen. Nodes: No cervical  adenopathy. Psych: Slightly anxious with slightly pressured speech.  Pleasant, cooperative.  Does not seem depressed.  Intake/Output from previous day: 02/12 0701 - 02/13 0700 In: 2211.5 [I.V.:11.5; IV Piggyback:2200] Out: 900 [Urine:900] Intake/Output this shift: No intake/output data recorded.  LAB RESULTS: Recent Labs     07/14/17 1944 07/15/17 0414  WBC 7.5 6.4  HGB 14.6 12.8*  HCT 40.0 36.1*  PLT 236 207   BMET Lab Results  Component Value Date   NA 131 (L) 07/15/2017   NA 126 (L) 07/14/2017   NA 134 (L) 07/16/2016   K 3.5 07/15/2017   K 4.3 07/14/2017   K 4.2 07/16/2016   CL 99 (L) 07/15/2017   CL 93 (L) 07/14/2017   CL 96 (L) 07/16/2016   CO2 21 (L) 07/15/2017   CO2 17 (L) 07/14/2017   CO2 25 07/16/2016   GLUCOSE 234 (H) 07/15/2017   GLUCOSE 682 (HH) 07/14/2017   GLUCOSE 288 (H) 07/16/2016   BUN 12 07/15/2017   BUN 17 07/14/2017   BUN 15 07/16/2016   CREATININE 0.93 07/15/2017   CREATININE 1.31 (H) 07/14/2017   CREATININE 1.18 07/16/2016   CALCIUM 8.3 (L) 07/15/2017   CALCIUM 9.1 07/14/2017   CALCIUM 9.4 07/16/2016   LFT Recent Labs    07/14/17 1944  PROT 7.1  ALBUMIN 4.0  AST 21  ALT 22  ALKPHOS 115  BILITOT 1.7*   PT/INR Lab Results  Component Value Date   INR 0.94 06/10/2011   Hepatitis Panel No results for input(s): HEPBSAG, HCVAB, HEPAIGM, HEPBIGM in the last 72 hours. C-Diff No components found for: CDIFF Lipase     Component Value Date/Time   LIPASE 42 07/15/2017 0130    Drugs of Abuse     Component Value Date/Time   LABOPIA NONE DETECTED 07/16/2016 1640   COCAINSCRNUR NONE DETECTED 07/16/2016 1640   LABBENZ NONE DETECTED 07/16/2016 1640   AMPHETMU POSITIVE (A) 07/16/2016 1640   THCU NONE DETECTED 07/16/2016 1640   LABBARB NONE DETECTED 07/16/2016 1640     RADIOLOGY STUDIES: Dg Chest 2 View  Result Date: 07/14/2017 CLINICAL DATA:  Chest pain EXAM: CHEST  2 VIEW COMPARISON:  08/04/2006 FINDINGS: Lingular scarring, stable. Right lung clear. Heart is normal size. No effusions or acute bony abnormality. IMPRESSION: Lingular scarring.  No active disease. Electronically Signed   By: Charlett NoseKevin  Dover M.D.   On: 07/14/2017 20:35       Jennye MoccasinSarah Gribbin  07/15/2017, 10:46 AM Pager: [phone]

## 2017-07-15 NOTE — H&P (Signed)
History and Physical    [name] ZOX:[id number] DOB: 1966/02/11 DOA: 07/15/2017  PCP: Ananias Pilgrim, MD  Patient coming from: Home.  Chief Complaint: Chest pain.  HPI: [name] is a 52 y.o. male with history of diabetes mellitus type 2, ADD, neuropathy presents to the ER with complaint of chest pain.  Patient states he has been having chest pain off and on for last 1 week.  Pain is mostly retrosternal burning sensation nonradiating no associated shortness of breath constant.  Patient states he has been having dysphagia for liquid diet and also increase the pain when he drinks.  Patient has not been taking his Lantus insulin for last couple of weeks after he ran out and I was not able to afford.  Denies any shortness of breath vomiting abdominal pain diarrhea.  ED Course: In the ER patient blood sugar was found to be on 600 with anion gap 18 patient was started on insulin infusion and IV fluids for DKA.  EKG shows nonspecific ST-T changes with troponin negative.  Patient admitted for further management of DKA and chest pain.  Review of Systems: As per HPI, rest all negative.   Past Medical History:  Diagnosis Date  . ADHD (attention deficit hyperactivity disorder)   . Anxiety   . Anxiety and depression   . CIDP (chronic inflammatory demyelinating polyneuropathy) (HCC)   . Depression   . Diabetes type 2, controlled (HCC)   . Diverticulosis   . Hyperlipidemia   . Kidney stones   . Pancreatitis   . Stomach ulcer     Past Surgical History:  Procedure Laterality Date  . ANKLE SURGERY    . CERVICAL DISC SURGERY     C4-C5  . ELBOW SURGERY    . RETINAL DETACHMENT SURGERY    . RHINOPLASTY    . VASECTOMY       reports that he has quit smoking. He has quit using smokeless tobacco. He reports that he drinks alcohol. He reports that he does not use drugs.  No Known Allergies  Family History  Problem Relation Age of Onset  . Stroke Father   . Breast cancer  Mother   . Colon cancer Neg Hx     Prior to Admission medications   Medication Sig Start Date End Date Taking? Authorizing Provider  acetaminophen (TYLENOL) 325 MG tablet Take 325-650 mg by mouth every 6 (six) hours as needed (for pain or headaches).   Yes [provider]  Amphetamine Sulfate (EVEKEO) 10 MG TABS Take 20 mg by mouth 2 (two) times daily.    Yes [provider]  diphenhydramine-acetaminophen (TYLENOL PM) 25-500 MG TABS tablet Take 1-2 tablets by mouth at bedtime as needed (for sleep).   Yes [provider]  gabapentin (NEURONTIN) 800 MG tablet Take 800 mg by mouth 3 (three) times daily.   Yes [provider]  ibuprofen (ADVIL,MOTRIN) 200 MG tablet Take 200-600 mg by mouth every 6 (six) hours as needed (for pain or headaches).    Yes [provider]  ramipril (ALTACE) 2.5 MG capsule Take 2.5 mg by mouth daily.    Yes [provider]  zolpidem (AMBIEN) 10 MG tablet Take 10 mg by mouth at bedtime as needed for sleep.    Yes [provider]  meloxicam (MOBIC) 15 MG tablet Take one a day for 10 days then take as needed Patient not taking: Reported on 07/14/2017 08/31/15   Briscoe Deutscher, DO  nitroGLYCERIN [facility])  0.2 mg/hr patch Place 1/4 patch to affected area daily Patient not taking: Reported on 07/14/2017 08/31/15   Briscoe Deutscher, DO    Physical Exam: Vitals:   07/15/17 0230 07/15/17 0245 07/15/17 0300 07/15/17 0315  BP: (!) 153/76 138/71 (!) 150/75 (!) 149/90  Pulse: 78 (!) 52 72   Resp: 13 19 13 20   Temp:      TempSrc:      SpO2: 99% 94% 98%   Weight:      Height:          Constitutional: Moderately built and nourished. Vitals:   07/15/17 0230 07/15/17 0245 07/15/17 0300 07/15/17 0315  BP: (!) 153/76 138/71 (!) 150/75 (!) 149/90  Pulse: 78 (!) 52 72   Resp: 13 19 13 20   Temp:      TempSrc:      SpO2: 99% 94% 98%   Weight:      Height:       Eyes: Anicteric no pallor. ENMT: No discharge from the  ears eyes nose or mouth. Neck: No mass palpated no JVD appreciated. Respiratory: No rhonchi or crepitations. Cardiovascular: S1-S2 heard no murmurs appreciated. Abdomen: Soft nontender bowel sounds present.  No guarding or rigidity. Musculoskeletal: No edema.  No joint effusion. Skin: No rash.  Skin appears warm. Neurologic: Alert awake oriented to time place and person.  Moves all extremities. Psychiatric: Appears normal.  Normal affect.   Labs on Admission: I have personally reviewed following labs and imaging studies  CBC: Recent Labs  Lab 07/14/17 1944  WBC 7.5  HGB 14.6  HCT 40.0  MCV 86.8  PLT 236   Basic Metabolic Panel: Recent Labs  Lab 07/14/17 1944  NA 126*  K 4.3  CL 93*  CO2 17*  GLUCOSE 682*  BUN 17  CREATININE 1.31*  CALCIUM 9.1   GFR: Estimated Creatinine Clearance: 87.4 mL/min (A) (by C-G formula based on SCr of 1.31 mg/dL (H)). Liver Function Tests: Recent Labs  Lab 07/14/17 1944  AST 21  ALT 22  ALKPHOS 115  BILITOT 1.7*  PROT 7.1  ALBUMIN 4.0   Recent Labs  Lab 07/15/17 0130  LIPASE 42   No results for input(s): AMMONIA in the last 168 hours. Coagulation Profile: No results for input(s): INR, PROTIME in the last 168 hours. Cardiac Enzymes: No results for input(s): CKTOTAL, CKMB, CKMBINDEX, TROPONINI in the last 168 hours. BNP (last 3 results) No results for input(s): PROBNP in the last 8760 hours. HbA1C: No results for input(s): HGBA1C in the last 72 hours. CBG: Recent Labs  Lab 07/15/17 0119 07/15/17 0225 07/15/17 0326  GLUCAP 461* 314* 300*   Lipid Profile: No results for input(s): CHOL, HDL, LDLCALC, TRIG, CHOLHDL, LDLDIRECT in the last 72 hours. Thyroid Function Tests: No results for input(s): TSH, T4TOTAL, FREET4, T3FREE, THYROIDAB in the last 72 hours. Anemia Panel: No results for input(s): VITAMINB12, FOLATE, FERRITIN, TIBC, IRON, RETICCTPCT in the last 72 hours. Urine analysis:    Component Value Date/Time    COLORURINE STRAW (A) 07/15/2017 0112   APPEARANCEUR CLEAR 07/15/2017 0112   LABSPEC 1.031 (H) 07/15/2017 0112   PHURINE 5.0 07/15/2017 0112   GLUCOSEU >=500 (A) 07/15/2017 0112   HGBUR NEGATIVE 07/15/2017 0112   BILIRUBINUR NEGATIVE 07/15/2017 0112   KETONESUR 20 (A) 07/15/2017 0112   PROTEINUR NEGATIVE 07/15/2017 0112   NITRITE NEGATIVE 07/15/2017 0112   LEUKOCYTESUR NEGATIVE 07/15/2017 0112   Sepsis Labs: @LABRCNTIP (procalcitonin:4,lacticidven:4) )No results found for this or any previous visit (from  the past 240 hour(s)).   Radiological Exams on Admission: Dg Chest 2 View  Result Date: 07/14/2017 CLINICAL DATA:  Chest pain EXAM: CHEST  2 VIEW COMPARISON:  08/04/2006 FINDINGS: Lingular scarring, stable. Right lung clear. Heart is normal size. No effusions or acute bony abnormality. IMPRESSION: Lingular scarring.  No active disease. Electronically Signed   By: Charlett NoseKevin  Dover M.D.   On: 07/14/2017 20:35    EKG: Independently reviewed.  Normal sinus rhythm with nonspecific ST-T changes.  Assessment/Plan Principal Problem:   DKA, type 2 (HCC) Active Problems:   Chest pain   ADD (attention deficit disorder)    1. Diabetic ketoacidosis and type 2 diabetes secondary to noncompliance with home medications -patient is on IV insulin infusion and I have received fluid bolus continue to monitor metabolic panel and once anion gap is corrected we will change to patient's home dose of Lantus 60 units at bedtime.  Check hemoglobin A1c. 2. Chest pain -appears atypical.  More likely dysphagia.  First will rule out ACS by cycling cardiac markers check 2D echo.  May consult cardiology in a.m.  Eventually patient will need EGD for dysphagia.  Patient is also placed on PPI. 3. ADD we will continue home medications. 4. Elevated blood pressure -follow blood pressure trends closely. 5. History of neuropathy on gabapentin.   DVT prophylaxis: Lovenox. Code Status: Full code. Family Communication:  Discussed with patient. Disposition Plan: Home. Consults called: None. Admission status: Observation.   Eduard ClosArshad N Darcy Barbara MD [facility] Pager [phone].  If 7PM-7AM, please contact night-coverage [email or web address] Password [email or web address]  07/15/2017, 4:12 AM

## 2017-07-15 NOTE — Consult Note (Signed)
                                                                           [facility] Consult: 10:46 AM 07/15/2017  LOS: 0 days    Referring Provider: Dr Onduka.    Primary Care Physician:  Asres, Alehegn, MD in [place] Primary Gastroenterologist:  unassigned    IMPRESSION:   *    Odyno/dysphagia.   Rule out Candida dial esophagitis.  Rule out GERD.  Rule out neoplasia.  *   Poorly controlled type II IDDM.  Ran out of Insulin 6 weeks ago and unable to afford.  Admitted with DKA.  On insulin drip.  *    Atypical chest pain, symptoms do not sound cardiac.  Thus far troponins are negative.  Nonspecific ST changes.  2D echo performed within the last hour, results pending.  *   Mild elevation of total bilirubin with otherwise normal LFTs.  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   2012 hx acute pancreatitis from hyper triglycerides and poorly controlled DM.   Lipase normal.      PLAN:     *   Allow full liquid/carb mod diet today.  Arrange for upper endoscopy tomorrow.  We will proceed with this unless cardiac or other medical issues force postponement.  Continue the once daily Protonix.  Obtain hepatic function profile which will give a fractionated bilirubin level,  I stopped the Lovenox due this afternoon.  This will give Dr. Gassner the option to dilate the patient's esophagus if needed. will resume tomorrow.  Ordered PAS stockings.      [facility] Attending   I have taken an interval history, reviewed the chart and examined the patient. I agree with the Advanced Practitioner's note, impression and recommendations.   EGD tomorrow - ? Candida, GERD, other.  The risks and benefits as well as alternatives of endoscopic procedure(s) have been discussed and reviewed. All questions answered. The patient agrees to proceed.  Carl E. Gessner, MD, FACG [facility] [phone]  (pager) 07/15/2017 6:53 PM     +++++++++++++++++++++++++++++++++++++++++++++++++++++++++++++++++++++++++++  Reason for Consultation:  Dysphagia, odynophagia, weight loss.      HPI: [name] is a 51 y.o. male.  Type 2 insulin requiring DM, historically poorly controlled.  He was diagnosed in approximately 2008 and began requiring insulin around 2012.    Depression.  ADHD. Severe, acute pancreatitis 10/2010 due to poorly controlled DM and hypertriglyceridemia.   Charcot -Marie disease.  Peripheral neuropathy.  B12 deficiency.   Patient has never undergone upper endoscopy or colonoscopy.  20 or more years ago he was diagnosed with PUD by upper GI series.  He does not use PPI or H2 blocker.   Beginning in early 06/2017, patient was unable to afford his insulin.  He got some samples from his physician but these ran out 3 weeks ago.  So essentially, for the last month, he has rarely used insulin. Since late last week having non-exertional chest discomfort as well as a sense of solids/liquids catching in his mid sternum.  Previously had occasional heartburn that began to be more frequent in recent weeks.  The discomfort is specially triggered by p.o. intake, liquids as well as solids.  Yesterday   he had an episode where the pain radiated across his chest, at the nipple level.  When he tried to swallow solid food (rice, burger) would not go down and he ended up having to regurgitate it back up. He is able to tolerate liquids and pain improved today.    Glucose 682.  Hgb A1C 11.6 (mean plasma glucose 286).   Troponin I negative x 4.   T bili 1.4 >> 1.7 but otherwise normal LFTs. Head CT negative.  CXR negative.    Patient was admitted for management of DKA and typical chest pain.  On insulin drip.  Daily PO Protonix started  Patient reports weight dropped from 250 -240 # over the last 4-6 weeks. Family Hx + for breast CA in mom and Dad died of either CVA or MI.  Dad probably had GERD.  No hx colon  cancer.    Past Medical History:  Diagnosis Date  . ADHD (attention deficit hyperactivity disorder)   . Anxiety and depression   . CIDP (chronic inflammatory demyelinating polyneuropathy) (HCC)   . Depression   . Diabetes type 2, controlled (HCC)   . Diverticulosis   . Hyperlipidemia   . Kidney stones   . Pancreatitis   . Stomach ulcer   . Vitamin D deficiency     Past Surgical History:  Procedure Laterality Date  . ANKLE SURGERY    . CERVICAL DISC SURGERY     C4-C5  . ELBOW SURGERY    . RETINAL DETACHMENT SURGERY    . RHINOPLASTY    . VASECTOMY      Prior to Admission medications   Medication Sig Start Date End Date Taking? Authorizing Provider  acetaminophen (TYLENOL) 325 MG tablet Take 325-650 mg by mouth every 6 (six) hours as needed (for pain or headaches).   Yes [provider]  Amphetamine Sulfate (EVEKEO) 10 MG TABS Take 20 mg by mouth 2 (two) times daily.    Yes [provider]  diphenhydramine-acetaminophen (TYLENOL PM) 25-500 MG TABS tablet Take 1-2 tablets by mouth at bedtime as needed (for sleep).   Yes [provider]  gabapentin (NEURONTIN) 800 MG tablet Take 800 mg by mouth 3 (three) times daily.   Yes [provider]  ibuprofen (ADVIL,MOTRIN) 200 MG tablet Take 200-600 mg by mouth every 6 (six) hours as needed (for pain or headaches).    Yes [provider]  ramipril (ALTACE) 2.5 MG capsule Take 2.5 mg by mouth daily.    Yes [provider]  zolpidem (AMBIEN) 10 MG tablet Take 10 mg by mouth at bedtime as needed for sleep.    Yes [provider]  meloxicam (MOBIC) 15 MG tablet Take one a day for 10 days then take as needed Patient not taking: Reported on 07/14/2017 08/31/15   Hess, Bryan R, DO  nitroGLYCERIN (MINITRAN) 0.2 mg/hr patch Place 1/4 patch to affected area daily Patient not taking: Reported on 07/14/2017 08/31/15   Hess, Bryan R, DO    Scheduled Meds: . Amphetamine Sulfate  20 mg Oral  BID  . aspirin  325 mg Oral Daily  . enoxaparin (LOVENOX) injection  40 mg Subcutaneous Q24H  . gabapentin  800 mg Oral TID  . insulin glargine  20 Units Subcutaneous BID  . pantoprazole (PROTONIX) IV  40 mg Intravenous Q24H   Infusions: . sodium chloride Stopped (07/15/17 0437)  . sodium chloride 125 mL/hr at 07/15/17 0437  . dextrose 5 % and 0.45% NaCl 100 mL/hr at   07/15/17 0541  . insulin (NOVOLIN-R) infusion 2.1 Units/hr (07/15/17 0834)   PRN Meds:    Allergies as of 07/14/2017  . (No Known Allergies)    Family History  Problem Relation Age of Onset  . Stroke Father   . Breast cancer Mother   . Colon cancer Neg Hx     Social History   Socioeconomic History  . Marital status: Single    Spouse name: Not on file  . Number of children: 3  . Years of education: Not on file  . Highest education level: Not on file  Social Needs  . Financial resource strain: Not on file  . Food insecurity - worry: Not on file  . Food insecurity - inability: Not on file  . Transportation needs - medical: Not on file  . Transportation needs - non-medical: Not on file  Occupational History  . Occupation: Photographer    Employer: [employer]  Tobacco Use  . Smoking status: Former Smoker  . Smokeless tobacco: Former User  Substance and Sexual Activity  . Alcohol use: Yes    Comment: Socially  . Drug use: No  . Sexual activity: Not on file  Other Topics Concern  . Not on file  Social History Narrative  . Not on file    REVIEW OF SYSTEMS: Constitutional: No profound fatigue or weakness. ENT:  No nose bleeds Pulm: No cough, no dyspnea. CV:  No palpitations, no LE edema.  GU:  No hematuria, no frequency GI:  Per HPI Heme: Denies unusual bleeding or bruising tendency. It has been a while since he had any B12 shots.  He is never had to take iron supplements. Transfusions: None ever Neuro: No seizures.  Has numbness/tingling in his feet. Ortho: Some discomfort in his feet and  feels like his arches are collapsing. Derm:  No itching, no rash or sores.  Endocrine:  No sweats or chills.  No polyuria or dysuria Immunization: Did not inquire as to recent immunizations. Travel:  None beyond local counties in last few months.    PHYSICAL EXAM: Vital signs in last 24 hours: Vitals:   07/15/17 0900 07/15/17 1000  BP: 126/73 136/83  Pulse: 63 (!) 35  Resp: 17 18  Temp:    SpO2:     Wt Readings from Last 3 Encounters:  07/15/17 108.9 kg (240 lb 1.3 oz)  01/18/16 111.1 kg (245 lb)  08/31/15 111.1 kg (245 lb)    General: Pleasant, well-appearing, alert, comfortable WM. Head: No facial asymmetry or swelling.  No signs of head trauma. Eyes: No scleral icterus.  No conjunctival pallor.  EOMI. Ears: Not hard of hearing. Nose: No discharge or congestion. Mouth: Tongue midline.  Good dentition.  Moist, pink, clear mucosa.  No signs of ulcers or Candida. Neck: No JVD, masses, thyromegaly.  Lungs:    Clear bilaterally.  No cough, no labored breathing. Heart: RRR.  No MRG.  S1, S2 present. Abdomen: Soft.  Not tender or distended.  No masses, no HSM, no hernias, no bruits..   Rectal: Did not perform. Musc/Skeltl: No obvious joint deformities, redness or swelling.  Despite the diagnosis of Charcot Marie disease, his feet do not look deformed. Extremities: No CCE.  No ulcers on the feet.  No palmar erythema. Neurologic: Alert.  Oriented x3.  Moves all 4 limbs.  No weakness, no tremor.  Grossly neurologically intact. Skin:  No palmar erythema.  No telangiectasia.  No suspicious lesions or rash. Tattoos: None seen. Nodes: No cervical   adenopathy. Psych: Slightly anxious with slightly pressured speech.  Pleasant, cooperative.  Does not seem depressed.  Intake/Output from previous day: 02/12 0701 - 02/13 0700 In: 2211.5 [I.V.:11.5; IV Piggyback:2200] Out: 900 [Urine:900] Intake/Output this shift: No intake/output data recorded.  LAB RESULTS: Recent Labs     07/14/17 1944 07/15/17 0414  WBC 7.5 6.4  HGB 14.6 12.8*  HCT 40.0 36.1*  PLT 236 207   BMET Lab Results  Component Value Date   NA 131 (L) 07/15/2017   NA 126 (L) 07/14/2017   NA 134 (L) 07/16/2016   K 3.5 07/15/2017   K 4.3 07/14/2017   K 4.2 07/16/2016   CL 99 (L) 07/15/2017   CL 93 (L) 07/14/2017   CL 96 (L) 07/16/2016   CO2 21 (L) 07/15/2017   CO2 17 (L) 07/14/2017   CO2 25 07/16/2016   GLUCOSE 234 (H) 07/15/2017   GLUCOSE 682 (HH) 07/14/2017   GLUCOSE 288 (H) 07/16/2016   BUN 12 07/15/2017   BUN 17 07/14/2017   BUN 15 07/16/2016   CREATININE 0.93 07/15/2017   CREATININE 1.31 (H) 07/14/2017   CREATININE 1.18 07/16/2016   CALCIUM 8.3 (L) 07/15/2017   CALCIUM 9.1 07/14/2017   CALCIUM 9.4 07/16/2016   LFT Recent Labs    07/14/17 1944  PROT 7.1  ALBUMIN 4.0  AST 21  ALT 22  ALKPHOS 115  BILITOT 1.7*   PT/INR Lab Results  Component Value Date   INR 0.94 06/10/2011   Hepatitis Panel No results for input(s): HEPBSAG, HCVAB, HEPAIGM, HEPBIGM in the last 72 hours. C-Diff No components found for: CDIFF Lipase     Component Value Date/Time   LIPASE 42 07/15/2017 0130    Drugs of Abuse     Component Value Date/Time   LABOPIA NONE DETECTED 07/16/2016 1640   COCAINSCRNUR NONE DETECTED 07/16/2016 1640   LABBENZ NONE DETECTED 07/16/2016 1640   AMPHETMU POSITIVE (A) 07/16/2016 1640   THCU NONE DETECTED 07/16/2016 1640   LABBARB NONE DETECTED 07/16/2016 1640     RADIOLOGY STUDIES: Dg Chest 2 View  Result Date: 07/14/2017 CLINICAL DATA:  Chest pain EXAM: CHEST  2 VIEW COMPARISON:  08/04/2006 FINDINGS: Lingular scarring, stable. Right lung clear. Heart is normal size. No effusions or acute bony abnormality. IMPRESSION: Lingular scarring.  No active disease. Electronically Signed   By: Kevin  Dover M.D.   On: 07/14/2017 20:35       Sarah Gribbin  07/15/2017, 10:46 AM Pager: [phone]     

## 2017-07-15 NOTE — ED Notes (Signed)
Attempted report x1. 

## 2017-07-15 NOTE — Progress Notes (Signed)
*  PRELIMINARY RESULTS* Echocardiogram 2D Echocardiogram has been performed.  Stacey DrainWhite, Tammey Deeg J 07/15/2017, 10:59 AM

## 2017-07-15 NOTE — Progress Notes (Signed)
Inpatient Diabetes Program Recommendations  AACE/ADA: New Consensus Statement on Inpatient Glycemic Control (2015)  Target Ranges:  Prepandial:   less than 140 mg/dL      Peak postprandial:   less than 180 mg/dL (1-2 hours)      Critically ill patients:  140 - 180 mg/dL   Lab Results  Component Value Date   GLUCAP 160 (H) 07/15/2017   HGBA1C 11.6 (H) 07/15/2017    Review of Glycemic Control  Diabetes history: DM1 Outpatient Diabetes medications: Lantus 70 units daily + Novolog meal coverage per sliding scale tid (usually 1-20 units if >200) Current orders for Inpatient glycemic control: Lantus 20 units bid  Inpatient Diabetes Program Recommendations:  Met with patient @ bedside. -Novolog 5 units tid meal coverage if eats 50% -Novolog hs correction 0-5  -Increase Lantus to 25 units bid  Spoke with pt about A1C 11.6  results with them and explained what an A1C is, basic pathophysiology of DM Type 2, basic home care, basic diabetes diet nutrition principles, importance of checking CBGs and maintaining good CBG control to prevent long-term and short-term complications. Reviewed signs and symptoms of hyperglycemia and hypoglycemia and how to treat hypoglycemia at home. Also reviewed blood sugar goals at home.  RNs to provide ongoing basic DM education at bedside with this patient. Patient was not able to purchase insulin due to out of pocket deductible was too high.  Requested patient to discuss option with his endocrinologist to use Novolin 70/30 insulin from [facility] if he runs out and unable to purchase prescription. Gave patient a Lantus copay card which will not be able to be used until out of pocket met. Patient is currently working with [place] and physicians to get insulin and has been taking less insulin than prescribed to allow insulin supply to last longer. Patient was type 2 until approx. 5-6 years ago and states transitioned to type 1. Patient has meter @ home and plans to get  additional strips to test CBGs. Patient shared that he was embarrassed to ask for help with his insulin and wasn't aware of 70/30 Novolin insulin mix @ [facility].  Thank you, Nani Gasser. Camala Talwar, RN, MSN, CDE  Diabetes Coordinator Inpatient Glycemic Control Team Team Pager [phone] (8am-5pm) 07/15/2017 3:25 PM

## 2017-07-16 ENCOUNTER — Inpatient Hospital Stay (HOSPITAL_COMMUNITY): Payer: BLUE CROSS/BLUE SHIELD | Admitting: Certified Registered Nurse Anesthetist

## 2017-07-16 ENCOUNTER — Encounter (HOSPITAL_COMMUNITY): Payer: Self-pay | Admitting: *Deleted

## 2017-07-16 ENCOUNTER — Encounter (HOSPITAL_COMMUNITY)
Admission: EM | Disposition: A | Discharge status: Home / Self Care | Payer: Self-pay | Source: Home / Self Care | Attending: Internal Medicine

## 2017-07-16 DIAGNOSIS — K3189 Other diseases of stomach and duodenum: Secondary | ICD-10-CM

## 2017-07-16 DIAGNOSIS — R1310 Dysphagia, unspecified: Secondary | ICD-10-CM

## 2017-07-16 DIAGNOSIS — Z9119 Patient's noncompliance with other medical treatment and regimen: Secondary | ICD-10-CM

## 2017-07-16 DIAGNOSIS — R1319 Other dysphagia: Secondary | ICD-10-CM

## 2017-07-16 DIAGNOSIS — B3781 Candidal esophagitis: Secondary | ICD-10-CM

## 2017-07-16 DIAGNOSIS — K259 Gastric ulcer, unspecified as acute or chronic, without hemorrhage or perforation: Secondary | ICD-10-CM

## 2017-07-16 DIAGNOSIS — K209 Esophagitis, unspecified: Secondary | ICD-10-CM

## 2017-07-16 DIAGNOSIS — F988 Other specified behavioral and emotional disorders with onset usually occurring in childhood and adolescence: Secondary | ICD-10-CM

## 2017-07-16 HISTORY — PX: ESOPHAGOGASTRODUODENOSCOPY: SHX5428

## 2017-07-16 LAB — RENAL FUNCTION PANEL
Albumin: 3.1 g/dL — ABNORMAL LOW (ref 3.5–5.0)
Anion gap: 8 (ref 5–15)
BUN: 7 mg/dL (ref 6–20)
CO2: 23 mmol/L (ref 22–32)
Calcium: 8.5 mg/dL — ABNORMAL LOW (ref 8.9–10.3)
Chloride: 104 mmol/L (ref 101–111)
Creatinine, Ser: 0.87 mg/dL (ref 0.61–1.24)
GFR calc Af Amer: >60 mL/min (ref >60)
GFR calc non Af Amer: >60 mL/min (ref >60)
Glucose, Bld: 224 mg/dL — ABNORMAL HIGH (ref 65–99)
Phosphorus: 2.2 mg/dL — ABNORMAL LOW (ref 2.5–4.6)
Potassium: 4.2 mmol/L (ref 3.5–5.1)
Sodium: 135 mmol/L (ref 135–145)

## 2017-07-16 LAB — MAGNESIUM: Magnesium: 2.1 mg/dL (ref 1.7–2.4)

## 2017-07-16 LAB — HEPATIC FUNCTION PANEL
ALBUMIN: 3.3 g/dL — AB (ref 3.5–5.0)
ALT: 22 U/L (ref 17–63)
AST: 29 U/L (ref 15–41)
Alkaline Phosphatase: 87 U/L (ref 38–126)
BILIRUBIN TOTAL: 0.9 mg/dL (ref 0.3–1.2)
Bilirubin, Direct: 0.2 mg/dL (ref 0.1–0.5)
Indirect Bilirubin: 0.7 mg/dL (ref 0.3–0.9)
TOTAL PROTEIN: 5.9 g/dL — AB (ref 6.5–8.1)

## 2017-07-16 LAB — GLUCOSE, CAPILLARY: GLUCOSE-CAPILLARY: 254 mg/dL — AB (ref 65–99)

## 2017-07-16 SURGERY — EGD (ESOPHAGOGASTRODUODENOSCOPY)
Anesthesia: Monitor Anesthesia Care

## 2017-07-16 MED ORDER — INSULIN NPH ISOPHANE & REGULAR (70-30) 100 UNIT/ML ~~LOC~~ SUSP: SUBCUTANEOUS | 1 refills | Status: DC

## 2017-07-16 MED ORDER — ASPIRIN 81 MG PO TABS: 81.0000 mg | ORAL_TABLET | Freq: Every day | ORAL | 0 refills | Status: DC

## 2017-07-16 MED ORDER — PANTOPRAZOLE SODIUM 40 MG PO TBEC: 40.0000 mg | DELAYED_RELEASE_TABLET | Freq: Every day | ORAL | 0 refills | Status: DC

## 2017-07-16 MED ORDER — PROPOFOL 10 MG/ML IV BOLUS
INTRAVENOUS | Status: DC | PRN
  Administered 2017-07-16: 10 mg via INTRAVENOUS
  Administered 2017-07-16 (×3): 20 mg via INTRAVENOUS

## 2017-07-16 MED ORDER — PANTOPRAZOLE SODIUM 40 MG PO TBEC: 40.0000 mg | DELAYED_RELEASE_TABLET | Freq: Every day | ORAL | Status: DC

## 2017-07-16 MED ORDER — INSULIN GLARGINE 100 UNIT/ML ~~LOC~~ SOLN
35.0000 [IU] | Freq: Two times a day (BID) | SUBCUTANEOUS | Status: DC
  Administered 2017-07-16: 35 [IU] via SUBCUTANEOUS
  Filled 2017-07-16 (×2): qty 0.35

## 2017-07-16 MED ORDER — FENTANYL CITRATE (PF) 100 MCG/2ML IJ SOLN
INTRAMUSCULAR | Status: DC | PRN
  Administered 2017-07-16: 50 ug via INTRAVENOUS

## 2017-07-16 MED ORDER — PROPOFOL 500 MG/50ML IV EMUL
INTRAVENOUS | Status: DC | PRN
  Administered 2017-07-16: 100 ug/kg/min via INTRAVENOUS

## 2017-07-16 MED ORDER — FLUCONAZOLE 100 MG PO TABS: 100.0000 mg | ORAL_TABLET | Freq: Every day | ORAL | 0 refills | Status: AC

## 2017-07-16 MED ORDER — FLUCONAZOLE 200 MG PO TABS
200.0000 mg | ORAL_TABLET | Freq: Once | ORAL | Status: AC
  Administered 2017-07-16: 200 mg via ORAL
  Filled 2017-07-16: qty 1

## 2017-07-16 MED ORDER — LACTATED RINGERS IV SOLN
INTRAVENOUS | Status: AC | PRN
  Administered 2017-07-16: 1000 mL via INTRAVENOUS

## 2017-07-16 NOTE — Transfer of Care (Signed)
Immediate Anesthesia Transfer of Care Note  Patient: [name]  Procedure(s) Performed: ESOPHAGOGASTRODUODENOSCOPY (EGD) (N/A )  Patient Location: Endoscopy Unit  Anesthesia Type:MAC  Level of Consciousness: awake, alert , oriented and patient cooperative  Airway & Oxygen Therapy: Patient Spontanous Breathing  Post-op Assessment: Report given to RN, Post -op Vital signs reviewed and stable and Patient moving all extremities X 4  Post vital signs: Reviewed and stable  Last Vitals:  Vitals:   07/16/17 0746 07/16/17 1105  BP: (!) 141/73 (!) 154/90  Pulse:    Resp:  14  Temp: 36.8 C 37 C  SpO2: 95% 99%    Last Pain:  Vitals:   07/16/17 1105  TempSrc: Oral  PainSc:          Complications: No apparent anesthesia complications

## 2017-07-16 NOTE — Discharge Summary (Signed)
Physician Discharge Summary  Patient ID: Erik SoloSamuel B Rocchio MRN: [id number] DOB/AGE: 54-Aug-1967 54 y.o.  Admit date: 07/15/2017 Discharge date: 07/16/2017  Admission Diagnoses:  Discharge Diagnoses:  - DKA, type 2 (HCC) - Grade B esophagitis. Likely esophageal candidiasis (I assume that this is secondary to uncontrolled diabetes mellitus.  PCP should currently have a low threshold to also screen patient for HIV) -Odynophagia/dysphagia. -Noncompliance with medication. -Acute kidney injury, resolved. -Volume depletion. - Chest pain - ADD (attention deficit disorder) -Hypertension. -Electrolyte abnormalities.   Discharged Condition: stable  Brief history: Patient is a 52 year old Caucasian male with past medical history significant for diabetes mellitus type 2, ADD, neuropathy compliance with medication.    Patient was supposed to be on subcutaneous Lantus and sliding scale NovoLog for the management of diabetes mellitus.  According to the patient, he has not been able to afford insulin for a couple of months.  Few days prior to presentation, the patient developed dysphagia and odynophagia/chest pain, and on presentation to the hospital, the patient was found to be in diabetes ketoacidosis.  The blood sugar was 682 with an anion gap of 16.  Serum creatinine was also 1.4.  The patient was admitted and managed for diabetes ketoacidosis, acute kidney injury, volume depletion, electrolyte abnormality, chest pain that started to be of GI etiology, odynophagia and dysphagia.  GI team was consulted to assist with management of dysphagia and odynophagia.  Patient proceeded with EGD.  EGD revealed grade B esophagitis and tiny whitish findings around the lower esophageal area that is suspicious for esophageal candidiasis.  Biopsy was taken.  Patient's PCP and GI team will currently follow the results of the esophageal biopsy.  Troponins came back negative.  Echocardiogram was normal.  Patient is eager to  be discharged back home.  According to the patient, he has family emergency.  Case management was consulted to assist with patient's discharge needs.   Hospital Course:  Diabetes ketoacidosis: Patient was managed with aggressive hydration, insulin drip, regular monitoring and repletion of abnormal electrolytes.  DKA has resolved.  Abnormal electrolytes were repleted.  Patient was transitioned to long-acting insulin, subcutaneous Lantus insulin.  Sliding scale insulin coverage was continued with short acting insulin during the hospital stay.  His HbA1c was 11.6.  Need to comply with diabetes mellitus management was discussed with the patient extensively.  Diabetic educator was consulted.  Patient will be discharged on NPH insulin.  The patient will follow with the PCP and endocrinologist within 1 week.  [facility] and left a voicemail.  Patient and do endocrinologist is Dr. Daylene KatayamaMonica Doeer, and office number is [phone].  Acute kidney injury: This has resolved. This is most likely secondary to volume depletion.  Volume depletion: This has resolved.  Chest pain: Troponin came back negative.  Etiology of the chest pain was thought to be secondary to GI problems.  Echocardiogram also came back negative.  GI team was consulted for EGD.  EGD revealed esophagitis and likely esophageal candidiasis.  Biopsies pending.  Patient will need to follow with PCP and the GI team to discuss the biopsy results.    Dysphagia/odynophagia: See above.  Patient will be discharged on Diflucan and PPI.  GI input is highly appreciated.  Hypertension: This is optimize.  Continue to monitor and manage accordingly.   Noncompliance: Discussed need to comply with diabetes management with the patient extensively.  Abnormal electrolytes: Electrolytes were monitored and controlled accordingly.    Consults: GI  Significant Diagnostic Studies: HbA1c was 11.6.  Discharge  Exam: Blood pressure 126/85, pulse 69, temperature 98.4 F (36.9 C), temperature source Oral, resp. rate 15, height 6\' 4"  (1.93 m), weight 235 lb (106.6 kg), SpO2 97 %.   Disposition: 01-Home or Self Care  Discharge Instructions    Call MD for:   Complete by:  As directed    Please call MD if the symptoms worsen.   Diet - low sodium heart healthy   Complete by:  As directed    Diet Carb Modified   Complete by:  As directed    Discharge instructions   Complete by:  As directed    Please check blood sugar 4 times daily, document the readings, and review with her primary care provider and endocrinologist.  Call your PCP or endocrinologist for blood sugar greater than 350 or less than 90.   Increase activity slowly   Complete by:  As directed      Allergies as of 07/16/2017   No Known Allergies     Medication List    STOP taking these medications   acetaminophen 325 MG tablet Commonly known as:  TYLENOL   diphenhydramine-acetaminophen 25-500 MG Tabs tablet Commonly known as:  TYLENOL PM   ibuprofen 200 MG tablet Commonly known as:  ADVIL,MOTRIN   meloxicam 15 MG tablet Commonly known as:  MOBIC   nitroGLYCERIN 0.2 mg/hr patch Commonly known as:  MINITRAN     TAKE these medications   aspirin 81 MG tablet Take 1 tablet (81 mg total) by mouth daily. Start taking on:  07/17/2017   EVEKEO 10 MG Tabs Generic drug:  Amphetamine Sulfate Take 20 mg by mouth 2 (two) times daily.   fluconazole 100 MG tablet Commonly known as:  DIFLUCAN Take 1 tablet (100 mg total) by mouth daily for 14 days.   gabapentin 800 MG tablet Commonly known as:  NEURONTIN Take 800 mg by mouth 3 (three) times daily.   insulin NPH-regular Human (70-30) 100 UNIT/ML injection Commonly known as:  NOVOLIN 70/30 Insulin NPH 40 units in am and 30 Units at night time.   pantoprazole 40 MG tablet Commonly known as:  PROTONIX Take 1 tablet (40 mg total) by mouth daily before breakfast. Start taking  on:  07/17/2017   ramipril 2.5 MG capsule Commonly known as:  ALTACE Take 2.5 mg by mouth daily.   zolpidem 10 MG tablet Commonly known as:  AMBIEN Take 10 mg by mouth at bedtime as needed for sleep.      33 minutes spent discharging this patient.  SignedBarnetta Chapel 07/16/2017, 4:28 PM

## 2017-07-16 NOTE — Anesthesia Preprocedure Evaluation (Signed)
Anesthesia Evaluation  Patient identified by MRN, date of birth, ID band Patient awake    Reviewed: Allergy & Precautions, NPO status , Patient's Chart, lab work & pertinent test results  Airway Mallampati: II  TM Distance: >3 FB Neck ROM: Full    Dental no notable dental hx.    Pulmonary neg pulmonary ROS, former smoker,    Pulmonary exam normal breath sounds clear to auscultation       Cardiovascular negative cardio ROS Normal cardiovascular exam Rhythm:Regular Rate:Normal     Neuro/Psych Anxiety Depression negative neurological ROS  negative psych ROS   GI/Hepatic negative GI ROS, Neg liver ROS,   Endo/Other  negative endocrine ROSdiabetes, Insulin Dependent  Renal/GU negative Renal ROS  negative genitourinary   Musculoskeletal negative musculoskeletal ROS (+) Arthritis ,   Abdominal   Peds negative pediatric ROS (+)  Hematology negative hematology ROS (+)   Anesthesia Other Findings   Reproductive/Obstetrics negative OB ROS                             Anesthesia Physical Anesthesia Plan  ASA: III  Anesthesia Plan: MAC   Post-op Pain Management:    Induction: Intravenous  PONV Risk Score and Plan: 1 and Treatment may vary due to age or medical condition  Airway Management Planned: Nasal Cannula  Additional Equipment:   Intra-op Plan:   Post-operative Plan:   Informed Consent: I have reviewed the patients History and Physical, chart, labs and discussed the procedure including the risks, benefits and alternatives for the proposed anesthesia with the patient or authorized representative who has indicated his/her understanding and acceptance.   Dental advisory given  Plan Discussed with: CRNA  Anesthesia Plan Comments:         Anesthesia Quick Evaluation

## 2017-07-16 NOTE — Interval H&P Note (Signed)
History and Physical Interval Note:  07/16/2017 12:31 PM  [name]  has presented today for surgery, with the diagnosis of Odynophagia.  Dysphagia.  Chest pain.  Weight loss.  Uncontrolled diabetes.  DKA.  The various methods of treatment have been discussed with the patient and family. After consideration of risks, benefits and other options for treatment, the patient has consented to  Procedure(s): ESOPHAGOGASTRODUODENOSCOPY (EGD) (N/A) as a surgical intervention .  The patient's history has been reviewed, patient examined, no change in status, stable for surgery.  I have reviewed the patient's chart and labs.  Questions were answered to the patient's satisfaction.     Stan Headarl Gessner

## 2017-07-16 NOTE — Care Management Note (Addendum)
Case Management Note  Patient Details  Name: [name] MRN: [id number] Date of Birth: 12/12/1965  Subjective/Objective:    Presents with DKA, From home, pta indep, he has A pcp and medication coverage. He states he has lantus insulin he can use and he will speak to his endocrinologist about 70/30.  NCM reiterated what diabetic coordinator told him [facility] and he will need to make sure his endocrinologist agrees with him taking that.  Patient has transportation at dc.                  Action/Plan: DC home today.  Expected Discharge Date:                  Expected Discharge Plan:  Home/Self Care  In-House Referral:     Discharge planning Services  CM Consult  Post Acute Care Choice:    Choice offered to:     DME Arranged:    DME Agency:     HH Arranged:    HH Agency:     Status of Service:  Completed, signed off  If discussed at [employer] Length of Stay Meetings, dates discussed:    Additional Comments:  Leone Havenaylor, [name], RN 07/16/2017, 3:17 PM

## 2017-07-16 NOTE — Op Note (Signed)
[facility] Patient Name: [name] Procedure Date : 07/16/2017 MRN: [id number] Attending MD: [name] , MD Date of Birth: 15-Jul-1965 CSN: [id number] Age: 52 Admit Type: Inpatient Procedure:                Upper GI endoscopy Indications:              Dysphagia Providers:                [name], MDJennifer Kappus, RN, Kirstie Mirza Referring MD:              Medicines:                Monitored Anesthesia Care Complications:            No immediate complications. Estimated Blood Loss:     Estimated blood loss was minimal. Procedure:                Pre-Anesthesia Assessment:                           - Prior to the procedure, a History and Physical                            was performed, and patient medications and                            allergies were reviewed. The patient's tolerance of                            previous anesthesia was also reviewed. The risks                            and benefits of the procedure and the sedation                            options and risks were discussed with the patient.                            All questions were answered, and informed consent                            was obtained. Prior Anticoagulants: The patient has                            taken no previous anticoagulant or antiplatelet                            agents. ASA Grade Assessment: III - A patient with                            severe systemic disease. After reviewing the risks  and benefits, the patient was deemed in                            satisfactory condition to undergo the procedure.                           After obtaining informed consent, the endoscope was                            passed under direct vision. Throughout the                            procedure, the patient's blood pressure, pulse, and                            oxygen saturations were monitored  continuously. The                            [id number]-[id number] ([id number]) scope was introduced through the                            mouth, and advanced to the second part of duodenum.                            The upper GI endoscopy was accomplished without                            difficulty. The patient tolerated the procedure                            well. Scope In: Scope Out: Findings:      LA Grade B (one or more mucosal breaks greater than 5 mm, not extending       between the tops of two mucosal folds) esophagitis with no bleeding was       found in the lower third of the esophagus. Some tiny white plaques also.       Biopsies were taken with a cold forceps for histology. Verification of       patient identification for the specimen was done. Estimated blood loss       was minimal.      Many non-bleeding superficial gastric ulcers with no stigmata of       bleeding were found in the gastric antrum. Biopsies were taken with a       cold forceps for histology. Verification of patient identification for       the specimen was done. Estimated blood loss was minimal.      The exam was otherwise without abnormality.      The cardia and gastric fundus were normal on retroflexion. Impression:               - LA Grade B esophagitis also some ? Candida.                            Biopsied.                           -  Non-bleeding gastric ulcers with no stigmata of                            bleeding. Biopsied. Look like a stress gastritis.                           - The examination was otherwise normal. Moderate Sedation:      Please see anesthesia notes, moderate sedation not given Recommendation:           - Return patient to hospital ward for ongoing care.                           - Diabetic (ADA) diet.                           - Follow an antireflux regimen.                           - Use Protonix (pantoprazole) 40 mg PO daily.                           - Await pathology results.                            - Continue present medications. Procedure Code(s):        --- Professional ---                           [phone], Esophagogastroduodenoscopy, flexible,                            transoral; with biopsy, single or multiple Diagnosis Code(s):        --- Professional ---                           K20.9, Esophagitis, unspecified                           K25.9, Gastric ulcer, unspecified as acute or                            chronic, without hemorrhage or perforation                           R13.10, Dysphagia, unspecified CPT copyright 2016 American Medical Association. All rights reserved. The codes documented in this report are preliminary and upon coder review may  be revised to meet current compliance requirements. Iva Booparl E Annete Ayuso, MD 07/16/2017 1:10:35 PM This report has been signed electronically. Number of Addenda: 0

## 2017-07-16 NOTE — Anesthesia Postprocedure Evaluation (Signed)
Anesthesia Post Note  Patient: [name]  Procedure(s) Performed: ESOPHAGOGASTRODUODENOSCOPY (EGD) (N/A )     Patient location during evaluation: PACU Anesthesia Type: MAC Level of consciousness: awake and alert Pain management: pain level controlled Vital Signs Assessment: post-procedure vital signs reviewed and stable Respiratory status: spontaneous breathing, nonlabored ventilation and respiratory function stable Cardiovascular status: stable and blood pressure returned to baseline Postop Assessment: no apparent nausea or vomiting Anesthetic complications: no    Last Vitals:  Vitals:   07/16/17 1105 07/16/17 1310  BP: (!) 154/90 115/75  Pulse:    Resp: 14 15  Temp: 37 C 37 C  SpO2: 99% 96%    Last Pain:  Vitals:   07/16/17 1310  TempSrc: Oral  PainSc:                  Lynda Rainwater

## 2017-07-16 NOTE — Progress Notes (Signed)
Pt informed and agreed to be discharged. Pt given discharge instructions and signed AVS. Returned pt's home amphetamine sulfate tablets. Pt given ordered dose of Diflucan before leaving. Pt vital signs stable. IV's removed. Pt wheeled off unit by Nurse Tech with cell phone, wallet, keys, clothing, and home medication in possession.

## 2017-07-17 ENCOUNTER — Encounter (HOSPITAL_COMMUNITY): Payer: Self-pay | Admitting: Internal Medicine

## 2017-07-17 LAB — GLUCOSE, CAPILLARY: GLUCOSE-CAPILLARY: 192 mg/dL — AB (ref 65–99)

## 2017-07-18 LAB — GLUCOSE, CAPILLARY: Glucose-Capillary: 255 mg/dL — ABNORMAL HIGH (ref 65–99)

## 2017-07-21 ENCOUNTER — Encounter: Payer: Self-pay | Admitting: Internal Medicine

## 2017-07-21 NOTE — Progress Notes (Signed)
Has Candida esophagitis  Needs fluconazole 100 mg take 2 first day then 1 qd until done # 22 tabs no refill  Stay on PPI as Rxed  Needs to get a [facility] ([facility]  See me prn

## 2017-07-22 ENCOUNTER — Other Ambulatory Visit: Payer: Self-pay

## 2017-07-22 MED ORDER — FLUCONAZOLE 100 MG PO TABS: ORAL_TABLET | ORAL | 0 refills | Status: DC

## 2017-08-17 ENCOUNTER — Ambulatory Visit (INDEPENDENT_AMBULATORY_CARE_PROVIDER_SITE_OTHER): Payer: BLUE CROSS/BLUE SHIELD

## 2017-08-17 ENCOUNTER — Ambulatory Visit: Payer: BLUE CROSS/BLUE SHIELD | Admitting: Podiatry

## 2017-08-17 DIAGNOSIS — M216X9 Other acquired deformities of unspecified foot: Secondary | ICD-10-CM

## 2017-08-17 DIAGNOSIS — G600 Hereditary motor and sensory neuropathy: Secondary | ICD-10-CM

## 2017-08-17 DIAGNOSIS — R2681 Unsteadiness on feet: Secondary | ICD-10-CM | POA: Diagnosis not present

## 2017-08-18 NOTE — Progress Notes (Signed)
Subjective: [name] presents the office today for concerns of continued concern over his feet.  He has noticed that since I last seen him he has noticed that he is getting more weak and he feels unsteady on his feet.  He did not proceed with bracing I last saw him due to financial concerns.  He does feel that overall he is getting weaker but he has not had any significant falls.  He does feel he is starting to walk pigeon toed on the right side.  Denies any systemic complaints such as fevers, chills, nausea, vomiting. No acute changes since last appointment, and no other complaints at this time.   Objective: AAO x3, NAD DP/PT pulses palpable bilaterally, CRT less than 3 seconds Manual muscle strength does appear to be decreased bilaterally there is atrophy of the calf consistent with Charcot-Marie-Tooth.  There is no area pinpoint bony tenderness or pain to vibratory sensation.  There is significant cavus foot deformity present bilaterally. No open lesions or pre-ulcerative lesions.  No pain with calf compression, swelling, warmth, erythema  Assessment: Cavus foot type, Charcot-Marie-Tooth bilaterally  Plan: -All treatment options discussed with the patient including all alternatives, risks, complications.  -With a long discussion regarding the options at this point.  Do recommend bracing given the progressive weakness.  I check with insurance for coverage information and have him come back in to [facility]. -Also recommend physical therapy to work on overall strength.  He agrees with this.  Prescription for [facility] was written today. -Patient encouraged to call the office with any questions, concerns, change in symptoms.   Vivi BarrackMatthew R Keyuana Wank DPM

## 2017-08-24 ENCOUNTER — Ambulatory Visit: Payer: BLUE CROSS/BLUE SHIELD | Admitting: Orthotics

## 2017-08-24 DIAGNOSIS — G600 Hereditary motor and sensory neuropathy: Secondary | ICD-10-CM

## 2017-08-24 DIAGNOSIS — M216X9 Other acquired deformities of unspecified foot: Secondary | ICD-10-CM

## 2017-08-24 DIAGNOSIS — R2681 Unsteadiness on feet: Secondary | ICD-10-CM

## 2017-09-01 NOTE — Progress Notes (Signed)
Patient presents today upon recommendation of Dr. Ardelle AntonWagoner for bilateral braces to help stabilize RF, offer arch support for CMT condition w/ secondary acquired pes cavus foot type.   Patient demonstrates weakened inverters/everters in frontal plane, as well adducted foot in traverse plane.   Plan on Richie style brace b/l with 4* of valgus RF and FF wedging; also [facility] receive [facility].

## 2017-09-17 DIAGNOSIS — K2100 Gastro-esophageal reflux disease with esophagitis, without bleeding: Secondary | ICD-10-CM | POA: Insufficient documentation

## 2017-10-13 ENCOUNTER — Other Ambulatory Visit: Payer: BLUE CROSS/BLUE SHIELD

## 2017-10-13 DIAGNOSIS — R2681 Unsteadiness on feet: Secondary | ICD-10-CM | POA: Diagnosis not present

## 2017-10-13 DIAGNOSIS — M216X9 Other acquired deformities of unspecified foot: Secondary | ICD-10-CM

## 2017-10-13 DIAGNOSIS — G600 Hereditary motor and sensory neuropathy: Secondary | ICD-10-CM | POA: Diagnosis not present

## 2017-11-03 DIAGNOSIS — G894 Chronic pain syndrome: Secondary | ICD-10-CM | POA: Insufficient documentation

## 2017-11-24 ENCOUNTER — Ambulatory Visit: Payer: BLUE CROSS/BLUE SHIELD | Admitting: Podiatry

## 2017-12-18 DIAGNOSIS — E11621 Type 2 diabetes mellitus with foot ulcer: Secondary | ICD-10-CM | POA: Insufficient documentation

## 2017-12-18 DIAGNOSIS — L97421 Non-pressure chronic ulcer of left heel and midfoot limited to breakdown of skin: Secondary | ICD-10-CM | POA: Insufficient documentation

## 2018-01-05 ENCOUNTER — Ambulatory Visit: Payer: BLUE CROSS/BLUE SHIELD | Admitting: Sports Medicine

## 2018-01-05 ENCOUNTER — Telehealth: Payer: Self-pay | Admitting: Sports Medicine

## 2018-01-05 NOTE — Telephone Encounter (Signed)
Patient called 1 hour after his appointment stating that he overslept and was in a lot pain. Patient was advised to go to ER or Urgent care if pain is extreme and can not wait until next available appt. -Dr. Marylene LandStover

## 2018-01-07 ENCOUNTER — Ambulatory Visit: Payer: BLUE CROSS/BLUE SHIELD | Admitting: Podiatry

## 2018-01-07 ENCOUNTER — Encounter

## 2018-01-07 ENCOUNTER — Ambulatory Visit (INDEPENDENT_AMBULATORY_CARE_PROVIDER_SITE_OTHER): Payer: BLUE CROSS/BLUE SHIELD

## 2018-01-07 ENCOUNTER — Encounter: Payer: Self-pay | Admitting: Podiatry

## 2018-01-07 DIAGNOSIS — R609 Edema, unspecified: Secondary | ICD-10-CM

## 2018-01-07 DIAGNOSIS — L97522 Non-pressure chronic ulcer of other part of left foot with fat layer exposed: Secondary | ICD-10-CM

## 2018-01-07 DIAGNOSIS — L03116 Cellulitis of left lower limb: Secondary | ICD-10-CM

## 2018-01-07 DIAGNOSIS — M25572 Pain in left ankle and joints of left foot: Secondary | ICD-10-CM | POA: Diagnosis not present

## 2018-01-07 MED ORDER — AMOXICILLIN-POT CLAVULANATE 875-125 MG PO TABS: 1.0000 | ORAL_TABLET | Freq: Two times a day (BID) | ORAL | 0 refills | Status: DC

## 2018-01-07 MED ORDER — MUPIROCIN 2 % EX OINT: 1.0000 "application " | TOPICAL_OINTMENT | Freq: Two times a day (BID) | CUTANEOUS | 2 refills | Status: DC

## 2018-01-07 NOTE — Progress Notes (Signed)
Subjective: 52 year old male presents the office with concerns of worsening left foot pain as well as ankle pain.  He states that over July 4 week and he notes a blister to the ball of his left foot and had the skin peel off and he has had a wound to the area. He is concerned because he has had increased pain to the left foot.  He states that he may have twisted his ankle because of having some ankle pain as well but he does not recall specific injury.  He has not taken any antibiotics currently.  He has no other concerns today. Denies any systemic complaints such as fevers, chills, nausea, vomiting. No acute changes since last appointment, and no other complaints at this time.   Objective: AAO x3, NAD DP/PT pulses palpable bilaterally, CRT less than 3 seconds On the left foot submetatarsal 4 is a hyperkeratotic lesion with a central granular wound.  There is hair and dirt within the wound.  After debridement today the wound measures 2 x 1.4 cm and superficial with a granular wound base.  I debrided the wound to healthy, granular tissue.  There is localized edema and faint erythema to the area.  There is no fluctuation or crepitation is no ascending cellulitis.  Mild increase in warmth of the foot.  There is tenderness palpation to both medial lateral aspect of the ankle but there is no significant swelling to the ankle.  Achilles tendon intact. No open lesions or pre-ulcerative lesions.  No pain with calf compression, swelling, warmth, erythema  Assessment: Left foot ulceration with localized cellulitis  Plan: -All treatment options discussed with the patient including all alternatives, risks, complications.  -X-rays were obtained reviewed.  Osteophyte the medial malleolus but there is no evidence of acute fracture identified today and there is no evidence of acute osteomyelitis there is no soft tissue emphysema present. -Utilizing #312 blade scalpel I sharply debrided the wound to healthy, granular  tissue to remove all nonviable tissue after the area was cleaned with alcohol.  Will do mupirocin ointment dressing changes daily which I prescribed.  Prescribed Augmentin as well.  Offloading at all times.  Cam boot was dispensed.  Elevation.  Limit activity. -Monitor for any clinical signs or symptoms of infection and directed to call the office immediately should any occur or go to the ER. -Patient encouraged to call the office with any questions, concerns, change in symptoms.  -RTC 1 week or sooner if needed  Erik BarrackMatthew R [name] DPM

## 2018-01-15 ENCOUNTER — Ambulatory Visit: Payer: BLUE CROSS/BLUE SHIELD | Admitting: Podiatry

## 2018-01-19 ENCOUNTER — Ambulatory Visit: Payer: BLUE CROSS/BLUE SHIELD | Admitting: Podiatry

## 2018-03-25 ENCOUNTER — Encounter (HOSPITAL_COMMUNITY): Payer: Self-pay

## 2018-03-25 ENCOUNTER — Inpatient Hospital Stay (HOSPITAL_COMMUNITY)
Admission: EM | Admit: 2018-03-25 | Discharge: 2018-03-30 | DRG: 854 | Disposition: A | Discharge status: Home / Self Care | Payer: BLUE CROSS/BLUE SHIELD | Attending: Internal Medicine | Admitting: Internal Medicine

## 2018-03-25 ENCOUNTER — Other Ambulatory Visit: Payer: Self-pay

## 2018-03-25 ENCOUNTER — Emergency Department (HOSPITAL_COMMUNITY): Payer: BLUE CROSS/BLUE SHIELD

## 2018-03-25 ENCOUNTER — Ambulatory Visit (HOSPITAL_COMMUNITY)
Admission: EM | Admit: 2018-03-25 | Discharge: 2018-03-25 | Disposition: A | Discharge status: Home / Self Care | Payer: BLUE CROSS/BLUE SHIELD

## 2018-03-25 DIAGNOSIS — Z23 Encounter for immunization: Secondary | ICD-10-CM

## 2018-03-25 DIAGNOSIS — F329 Major depressive disorder, single episode, unspecified: Secondary | ICD-10-CM | POA: Diagnosis present

## 2018-03-25 DIAGNOSIS — F909 Attention-deficit hyperactivity disorder, unspecified type: Secondary | ICD-10-CM | POA: Diagnosis present

## 2018-03-25 DIAGNOSIS — E10622 Type 1 diabetes mellitus with other skin ulcer: Secondary | ICD-10-CM | POA: Diagnosis not present

## 2018-03-25 DIAGNOSIS — G6181 Chronic inflammatory demyelinating polyneuritis: Secondary | ICD-10-CM | POA: Diagnosis present

## 2018-03-25 DIAGNOSIS — E781 Pure hyperglyceridemia: Secondary | ICD-10-CM | POA: Diagnosis present

## 2018-03-25 DIAGNOSIS — L03116 Cellulitis of left lower limb: Secondary | ICD-10-CM

## 2018-03-25 DIAGNOSIS — Z09 Encounter for follow-up examination after completed treatment for conditions other than malignant neoplasm: Secondary | ICD-10-CM

## 2018-03-25 DIAGNOSIS — R52 Pain, unspecified: Secondary | ICD-10-CM | POA: Diagnosis not present

## 2018-03-25 DIAGNOSIS — I809 Phlebitis and thrombophlebitis of unspecified site: Secondary | ICD-10-CM

## 2018-03-25 DIAGNOSIS — L97509 Non-pressure chronic ulcer of other part of unspecified foot with unspecified severity: Secondary | ICD-10-CM | POA: Diagnosis not present

## 2018-03-25 DIAGNOSIS — R6510 Systemic inflammatory response syndrome (SIRS) of non-infectious origin without acute organ dysfunction: Secondary | ICD-10-CM | POA: Diagnosis present

## 2018-03-25 DIAGNOSIS — A419 Sepsis, unspecified organism: Principal | ICD-10-CM | POA: Diagnosis present

## 2018-03-25 DIAGNOSIS — L98499 Non-pressure chronic ulcer of skin of other sites with unspecified severity: Secondary | ICD-10-CM | POA: Diagnosis not present

## 2018-03-25 DIAGNOSIS — E876 Hypokalemia: Secondary | ICD-10-CM | POA: Diagnosis not present

## 2018-03-25 DIAGNOSIS — M86679 Other chronic osteomyelitis, unspecified ankle and foot: Secondary | ICD-10-CM | POA: Diagnosis not present

## 2018-03-25 DIAGNOSIS — Z87442 Personal history of urinary calculi: Secondary | ICD-10-CM

## 2018-03-25 DIAGNOSIS — F419 Anxiety disorder, unspecified: Secondary | ICD-10-CM | POA: Diagnosis present

## 2018-03-25 DIAGNOSIS — Z794 Long term (current) use of insulin: Secondary | ICD-10-CM | POA: Diagnosis not present

## 2018-03-25 DIAGNOSIS — E871 Hypo-osmolality and hyponatremia: Secondary | ICD-10-CM | POA: Diagnosis present

## 2018-03-25 DIAGNOSIS — E11621 Type 2 diabetes mellitus with foot ulcer: Secondary | ICD-10-CM

## 2018-03-25 DIAGNOSIS — E1052 Type 1 diabetes mellitus with diabetic peripheral angiopathy with gangrene: Secondary | ICD-10-CM | POA: Diagnosis not present

## 2018-03-25 DIAGNOSIS — E785 Hyperlipidemia, unspecified: Secondary | ICD-10-CM | POA: Diagnosis present

## 2018-03-25 DIAGNOSIS — L97429 Non-pressure chronic ulcer of left heel and midfoot with unspecified severity: Secondary | ICD-10-CM | POA: Diagnosis present

## 2018-03-25 DIAGNOSIS — M869 Osteomyelitis, unspecified: Secondary | ICD-10-CM | POA: Diagnosis present

## 2018-03-25 DIAGNOSIS — Z79899 Other long term (current) drug therapy: Secondary | ICD-10-CM

## 2018-03-25 DIAGNOSIS — Z7982 Long term (current) use of aspirin: Secondary | ICD-10-CM

## 2018-03-25 DIAGNOSIS — Z87891 Personal history of nicotine dependence: Secondary | ICD-10-CM | POA: Diagnosis not present

## 2018-03-25 DIAGNOSIS — Z888 Allergy status to other drugs, medicaments and biological substances status: Secondary | ICD-10-CM | POA: Diagnosis not present

## 2018-03-25 LAB — URINALYSIS, ROUTINE W REFLEX MICROSCOPIC
Bacteria, UA: NONE SEEN
Bilirubin Urine: NEGATIVE
Glucose, UA: >=500 mg/dL — AB
Hgb urine dipstick: NEGATIVE
Ketones, ur: NEGATIVE mg/dL
LEUKOCYTES UA: NEGATIVE
Nitrite: NEGATIVE
PH: 5 (ref 5.0–8.0)
Protein, ur: NEGATIVE mg/dL
SPECIFIC GRAVITY, URINE: 1.027 (ref 1.005–1.030)

## 2018-03-25 LAB — CBC WITH DIFFERENTIAL/PLATELET
Abs Immature Granulocytes: 0.04 10*3/uL (ref 0.00–0.07)
Basophils Absolute: 0.1 10*3/uL (ref 0.0–0.1)
Basophils Relative: 1 %
EOS PCT: 2 %
Eosinophils Absolute: 0.2 10*3/uL (ref 0.0–0.5)
HEMATOCRIT: 43.1 % (ref 39.0–52.0)
HEMOGLOBIN: 13.4 g/dL (ref 13.0–17.0)
Immature Granulocytes: 0 %
LYMPHS ABS: 1.4 10*3/uL (ref 0.7–4.0)
LYMPHS PCT: 13 %
MCH: 27.9 pg (ref 26.0–34.0)
MCHC: 31.1 g/dL (ref 30.0–36.0)
MCV: 89.6 fL (ref 80.0–100.0)
MONO ABS: 0.7 10*3/uL (ref 0.1–1.0)
MONOS PCT: 7 %
Neutro Abs: 8.4 10*3/uL — ABNORMAL HIGH (ref 1.7–7.7)
Neutrophils Relative %: 77 %
Platelets: 276 10*3/uL (ref 150–400)
RBC: 4.81 MIL/uL (ref 4.22–5.81)
RDW: 13.2 % (ref 11.5–15.5)
WBC: 10.8 10*3/uL — AB (ref 4.0–10.5)
nRBC: 0 % (ref 0.0–0.2)

## 2018-03-25 LAB — COMPREHENSIVE METABOLIC PANEL
ALBUMIN: 3.6 g/dL (ref 3.5–5.0)
ALK PHOS: 90 U/L (ref 38–126)
ALT: 17 U/L (ref 0–44)
ANION GAP: 10 (ref 5–15)
AST: 16 U/L (ref 15–41)
BILIRUBIN TOTAL: 1 mg/dL (ref 0.3–1.2)
BUN: 12 mg/dL (ref 6–20)
CALCIUM: 9.4 mg/dL (ref 8.9–10.3)
CO2: 24 mmol/L (ref 22–32)
CREATININE: 1.19 mg/dL (ref 0.61–1.24)
Chloride: 96 mmol/L — ABNORMAL LOW (ref 98–111)
GFR calc Af Amer: >60 mL/min (ref >60)
GFR calc non Af Amer: >60 mL/min (ref >60)
GLUCOSE: 406 mg/dL — AB (ref 70–99)
Potassium: 4 mmol/L (ref 3.5–5.1)
Sodium: 130 mmol/L — ABNORMAL LOW (ref 135–145)
TOTAL PROTEIN: 7.3 g/dL (ref 6.5–8.1)

## 2018-03-25 LAB — PROTIME-INR
INR: 1.01
PROTHROMBIN TIME: 13.2 s (ref 11.4–15.2)

## 2018-03-25 LAB — I-STAT CG4 LACTIC ACID, ED
LACTIC ACID, VENOUS: 2.37 mmol/L — AB (ref 0.5–1.9)
Lactic Acid, Venous: 2.14 mmol/L (ref 0.5–1.9)

## 2018-03-25 LAB — GLUCOSE, CAPILLARY: Glucose-Capillary: 241 mg/dL — ABNORMAL HIGH (ref 70–99)

## 2018-03-25 MED ORDER — SODIUM CHLORIDE 0.9 % IV BOLUS
1000.0000 mL | Freq: Once | INTRAVENOUS | Status: AC
  Administered 2018-03-25: 1000 mL via INTRAVENOUS

## 2018-03-25 MED ORDER — INSULIN GLARGINE 100 UNIT/ML SOLOSTAR PEN: 70.0000 [IU] | PEN_INJECTOR | Freq: Every day | SUBCUTANEOUS | Status: DC

## 2018-03-25 MED ORDER — BISACODYL 5 MG PO TBEC: 5.0000 mg | DELAYED_RELEASE_TABLET | Freq: Every day | ORAL | Status: DC | PRN

## 2018-03-25 MED ORDER — ACETAMINOPHEN 325 MG PO TABS
650.0000 mg | ORAL_TABLET | Freq: Once | ORAL | Status: AC
  Administered 2018-03-25: 650 mg via ORAL
  Filled 2018-03-25: qty 2

## 2018-03-25 MED ORDER — ENOXAPARIN SODIUM 40 MG/0.4ML ~~LOC~~ SOLN
40.0000 mg | SUBCUTANEOUS | Status: DC
  Administered 2018-03-25 – 2018-03-29 (×5): 40 mg via SUBCUTANEOUS
  Filled 2018-03-25 (×5): qty 0.4

## 2018-03-25 MED ORDER — GABAPENTIN 400 MG PO CAPS
800.0000 mg | ORAL_CAPSULE | Freq: Once | ORAL | Status: AC
  Administered 2018-03-25: 800 mg via ORAL
  Filled 2018-03-25: qty 2

## 2018-03-25 MED ORDER — GABAPENTIN 400 MG PO CAPS
800.0000 mg | ORAL_CAPSULE | Freq: Three times a day (TID) | ORAL | Status: DC
  Administered 2018-03-25 – 2018-03-30 (×14): 800 mg via ORAL
  Filled 2018-03-25 (×14): qty 2

## 2018-03-25 MED ORDER — HYDROCODONE-ACETAMINOPHEN 5-325 MG PO TABS
1.0000 | ORAL_TABLET | Freq: Four times a day (QID) | ORAL | Status: DC | PRN
  Administered 2018-03-25 – 2018-03-27 (×5): 2 via ORAL
  Administered 2018-03-28: 1 via ORAL
  Administered 2018-03-29: 2 via ORAL
  Filled 2018-03-25 (×7): qty 2

## 2018-03-25 MED ORDER — PANTOPRAZOLE SODIUM 40 MG PO TBEC
40.0000 mg | DELAYED_RELEASE_TABLET | Freq: Every day | ORAL | Status: DC
  Administered 2018-03-26 – 2018-03-30 (×5): 40 mg via ORAL
  Filled 2018-03-25 (×5): qty 1

## 2018-03-25 MED ORDER — INSULIN ASPART PROT & ASPART (70-30 MIX) 100 UNIT/ML ~~LOC~~ SUSP: 40.0000 [IU] | Freq: Every day | SUBCUTANEOUS | Status: DC

## 2018-03-25 MED ORDER — INSULIN ASPART 100 UNIT/ML ~~LOC~~ SOLN
15.0000 [IU] | Freq: Three times a day (TID) | SUBCUTANEOUS | Status: DC
  Administered 2018-03-26 – 2018-03-30 (×10): 15 [IU] via SUBCUTANEOUS

## 2018-03-25 MED ORDER — VANCOMYCIN HCL 10 G IV SOLR
2000.0000 mg | INTRAVENOUS | Status: AC
  Administered 2018-03-25: 2000 mg via INTRAVENOUS
  Filled 2018-03-25: qty 2000

## 2018-03-25 MED ORDER — VANCOMYCIN HCL IN DEXTROSE 1-5 GM/200ML-% IV SOLN
1000.0000 mg | Freq: Once | INTRAVENOUS | Status: DC
  Filled 2018-03-25: qty 200

## 2018-03-25 MED ORDER — ZOLPIDEM TARTRATE 5 MG PO TABS
10.0000 mg | ORAL_TABLET | Freq: Every evening | ORAL | Status: DC | PRN
  Administered 2018-03-26 – 2018-03-29 (×4): 10 mg via ORAL
  Filled 2018-03-25 (×4): qty 2

## 2018-03-25 MED ORDER — SODIUM CHLORIDE 0.9 % IV SOLN
2.0000 g | INTRAVENOUS | Status: DC
  Administered 2018-03-25: 2 g via INTRAVENOUS
  Filled 2018-03-25: qty 20

## 2018-03-25 MED ORDER — SODIUM CHLORIDE 0.9 % IV SOLN
INTRAVENOUS | Status: DC
  Administered 2018-03-25 – 2018-03-26 (×2): via INTRAVENOUS

## 2018-03-25 MED ORDER — AMPHETAMINE SULFATE 10 MG PO TABS: 20.0000 mg | ORAL_TABLET | Freq: Two times a day (BID) | ORAL | Status: DC

## 2018-03-25 MED ORDER — VANCOMYCIN HCL IN DEXTROSE 1-5 GM/200ML-% IV SOLN
1000.0000 mg | Freq: Two times a day (BID) | INTRAVENOUS | Status: DC
  Administered 2018-03-26 – 2018-03-28 (×5): 1000 mg via INTRAVENOUS
  Filled 2018-03-25 (×6): qty 200

## 2018-03-25 MED ORDER — DULOXETINE HCL 60 MG PO CPEP
60.0000 mg | ORAL_CAPSULE | Freq: Every day | ORAL | Status: DC
  Administered 2018-03-25 – 2018-03-30 (×6): 60 mg via ORAL
  Filled 2018-03-25 (×6): qty 1

## 2018-03-25 MED ORDER — INSULIN ASPART 100 UNIT/ML ~~LOC~~ SOLN: 0.0000 [IU] | Freq: Every day | SUBCUTANEOUS | Status: DC

## 2018-03-25 MED ORDER — INSULIN GLARGINE 100 UNIT/ML ~~LOC~~ SOLN
50.0000 [IU] | Freq: Every day | SUBCUTANEOUS | Status: DC
  Administered 2018-03-25: 50 [IU] via SUBCUTANEOUS
  Filled 2018-03-25 (×2): qty 0.5

## 2018-03-25 MED ORDER — ACETAMINOPHEN 325 MG PO TABS: 650.0000 mg | ORAL_TABLET | Freq: Four times a day (QID) | ORAL | Status: DC | PRN

## 2018-03-25 MED ORDER — IBUPROFEN 800 MG PO TABS
800.0000 mg | ORAL_TABLET | Freq: Once | ORAL | Status: AC
  Administered 2018-03-25: 800 mg via ORAL
  Filled 2018-03-25: qty 1

## 2018-03-25 MED ORDER — CALCIUM CARBONATE ANTACID 500 MG PO CHEW: 1.0000 | CHEWABLE_TABLET | ORAL | Status: DC | PRN

## 2018-03-25 MED ORDER — ASPIRIN 81 MG PO CHEW
81.0000 mg | CHEWABLE_TABLET | Freq: Every day | ORAL | Status: DC
  Administered 2018-03-25 – 2018-03-30 (×6): 81 mg via ORAL
  Filled 2018-03-25 (×6): qty 1

## 2018-03-25 MED ORDER — FENOFIBRATE 160 MG PO TABS
160.0000 mg | ORAL_TABLET | Freq: Every day | ORAL | Status: DC
  Administered 2018-03-25 – 2018-03-30 (×6): 160 mg via ORAL
  Filled 2018-03-25 (×6): qty 1

## 2018-03-25 MED ORDER — INSULIN ASPART PROT & ASPART (70-30 MIX) 100 UNIT/ML ~~LOC~~ SUSP: 30.0000 [IU] | Freq: Every day | SUBCUTANEOUS | Status: DC

## 2018-03-25 MED ORDER — INSULIN ASPART 100 UNIT/ML ~~LOC~~ SOLN
0.0000 [IU] | Freq: Three times a day (TID) | SUBCUTANEOUS | Status: DC
  Administered 2018-03-26: 5 [IU] via SUBCUTANEOUS
  Administered 2018-03-26: 2 [IU] via SUBCUTANEOUS
  Administered 2018-03-27: 5 [IU] via SUBCUTANEOUS
  Administered 2018-03-28: 2 [IU] via SUBCUTANEOUS
  Administered 2018-03-28: 3 [IU] via SUBCUTANEOUS
  Administered 2018-03-28 – 2018-03-29 (×2): 2 [IU] via SUBCUTANEOUS
  Administered 2018-03-30: 3 [IU] via SUBCUTANEOUS

## 2018-03-25 NOTE — ED Provider Notes (Signed)
[facility] EMERGENCY DEPARTMENT Provider Note   CSN: [id number] Arrival date & time: 03/25/18  1353     History   Chief Complaint Chief Complaint  Patient presents with  . Leg Pain    HPI [name] is a 52 y.o. male.  The history is provided by the patient and medical records. No language interpreter was used.   [name] is a 52 y.o. male  with a PMH of DM who presents to the Emergency Department complaining of left foot pain.  Patient states that he developed an ulcer to the bottom of his foot back in July.  He was seen by the podiatrist for this beginning of August where he was started on antibiotics.  He felt as if the wound was healing well.  Over the last week, he has noticed swelling to the left foot and redness to the dorsum of the foot as well.  He did not know that he had a fever at home, however did have a temperature of 101.7 in triage.  He has not been on any antibiotics since he was on Augmentin back in August by his podiatrist.   Past Medical History:  Diagnosis Date  . ADHD (attention deficit hyperactivity disorder)   . Anxiety and depression   . Candida esophagitis (HCC)    in setting of DKA/hyperglycemia  . CIDP (chronic inflammatory demyelinating polyneuropathy) (HCC)   . Depression   . Diabetes type 2, controlled (HCC)   . Diverticulosis   . Hyperlipidemia   . Kidney stones   . Pancreatitis   . Stomach ulcer   . Vitamin D deficiency     Patient Active Problem List   Diagnosis Date Noted  . Sepsis (HCC) 03/25/2018  . Esophageal dysphagia   . DKA, type 2 (HCC) 07/15/2017  . Chest pain 07/15/2017  . ADD (attention deficit disorder) 07/15/2017  . DKA (diabetic ketoacidoses) (HCC) 07/15/2017  . Adhesive capsulitis of left shoulder 01/18/2016  . Adhesive capsulitis of right shoulder 03/24/2011  . Retinal detachment 12/06/2010  . Rotator cuff syndrome 12/06/2010  . History of pancreatitis 10/22/2010  .  Hypertriglyceridemia 10/22/2010  . Diabetes mellitus type II, uncontrolled (HCC) 10/22/2010    Past Surgical History:  Procedure Laterality Date  . ANKLE SURGERY    . CERVICAL DISC SURGERY     C4-C5  . ELBOW SURGERY    . ESOPHAGOGASTRODUODENOSCOPY N/A 07/16/2017   Procedure: ESOPHAGOGASTRODUODENOSCOPY (EGD);  Surgeon: [name], MD;  [facility] ENDOSCOPY;  Service: Endoscopy;  Laterality: N/A;  . RETINAL DETACHMENT SURGERY    . RHINOPLASTY    . VASECTOMY          Home Medications    Prior to Admission medications   Medication Sig Start Date End Date Taking? Authorizing Provider  Amphetamine Sulfate (EVEKEO) 10 MG TABS Take 20 mg by mouth 2 (two) times daily.    Yes [provider]  calcium carbonate (TUMS - DOSED IN MG ELEMENTAL CALCIUM) 500 MG chewable tablet Chew 1 tablet by mouth as needed for indigestion or heartburn.   Yes [provider]  Continuous Blood Gluc Receiver (FREESTYLE LIBRE 14 DAY READER) DEVI every 14 (fourteen) days.  01/05/18  Yes [provider]  DULoxetine (CYMBALTA) 60 MG capsule Take 60 mg by mouth daily. 02/26/18  Yes [provider]  fenofibrate 160 MG tablet Take 160 mg by mouth daily.   Yes [provider]  gabapentin (NEURONTIN) 800 MG tablet Take 800  mg by mouth 3 (three) times daily.   Yes [provider]  insulin aspart (NOVOLOG FLEXPEN) 100 UNIT/ML FlexPen Inject 22-32 Units into the skin See admin instructions. Inject 22-32 units into the skin three to four times a day before meals, per sliding scale   Yes [provider]  Insulin Glargine (LANTUS SOLOSTAR) 100 UNIT/ML Solostar Pen Inject 70 Units into the skin at bedtime.   Yes [provider]  zolpidem (AMBIEN) 10 MG tablet Take 10 mg by mouth at bedtime as needed for sleep.    Yes [provider]  amoxicillin-clavulanate (AUGMENTIN) 875-125 MG tablet Take 1 tablet by mouth 2 (two) times daily. Patient not  taking: Reported on 03/25/2018 01/07/18   Vivi Barrack, DPM  aspirin 81 MG tablet Take 1 tablet (81 mg total) by mouth daily. Patient not taking: Reported on 03/25/2018 07/17/17   Berton Mount I, MD  fluconazole (DIFLUCAN) 100 MG tablet Take 2 tablets by mouth on day 1 then take 1 tablet daily until gone Patient not taking: Reported on 03/25/2018 07/22/17   [name], MD  insulin NPH-regular Human (NOVOLIN 70/30) (70-30) 100 UNIT/ML injection Insulin NPH 40 units in am and 30 Units at night time. Patient not taking: Reported on 03/25/2018 07/16/17   Berton Mount I, MD  mupirocin ointment (BACTROBAN) 2 % Apply 1 application topically 2 (two) times daily. Patient not taking: Reported on 03/25/2018 01/07/18   Vivi Barrack, DPM  pantoprazole (PROTONIX) 40 MG tablet Take 1 tablet (40 mg total) by mouth daily before breakfast. Patient not taking: Reported on 03/25/2018 07/17/17   Barnetta Chapel, MD    Family History Family History  Problem Relation Age of Onset  . Stroke Father   . Breast cancer Mother   . Colon cancer Neg Hx     Social History Social History   Tobacco Use  . Smoking status: Former Games developer  . Smokeless tobacco: Former Engineer, water Use Topics  . Alcohol use: Yes    Comment: Socially  . Drug use: No     Allergies   Lithium; Adderall [amphetamine-dextroamphetamine]; and Ritalin [methylphenidate]   Review of Systems Review of Systems  Constitutional: Positive for fever.  Skin: Positive for color change and wound.  All other systems reviewed and are negative.    Physical Exam Updated Vital Signs BP (!) 144/74   Pulse 85   Temp 100.2 F (37.9 C) (Oral)   Resp (!) 22   Ht 6\' 4"  (1.93 m)   Wt 111.1 kg   SpO2 97%   BMI 29.82 kg/m   Physical Exam  Constitutional: He is oriented to person, place, and time. He appears well-developed and well-nourished. No distress.  HENT:  Head: Normocephalic and atraumatic.  Cardiovascular:  Normal rate, regular rhythm and normal heart sounds.  No murmur heard. Pulmonary/Chest: Effort normal and breath sounds normal. No respiratory distress.  Abdominal: Soft. He exhibits no distension. There is no tenderness.  Musculoskeletal: Normal range of motion.  Neurological: He is alert and oriented to person, place, and time.  Skin: Skin is warm and dry.  See images below of the left lower extremity.  Dorsum of the left foot is swollen, erythematous and warm to the touch.  He does appear to have some streaking up the leg as well.  Nursing note and vitals reviewed.        ED Treatments / Results  Labs (all labs ordered are listed, but only abnormal results are  displayed) Labs Reviewed  COMPREHENSIVE METABOLIC PANEL - Abnormal; Notable for the following components:      Result Value   Sodium 130 (*)    Chloride 96 (*)    Glucose, Bld 406 (*)    All other components within normal limits  CBC WITH DIFFERENTIAL/PLATELET - Abnormal; Notable for the following components:   WBC 10.8 (*)    Neutro Abs 8.4 (*)    All other components within normal limits  URINALYSIS, ROUTINE W REFLEX MICROSCOPIC - Abnormal; Notable for the following components:   Glucose, UA >=500 (*)    All other components within normal limits  I-STAT CG4 LACTIC ACID, ED - Abnormal; Notable for the following components:   Lactic Acid, Venous 2.14 (*)    All other components within normal limits  I-STAT CG4 LACTIC ACID, ED - Abnormal; Notable for the following components:   Lactic Acid, Venous 2.37 (*)    All other components within normal limits  CULTURE, BLOOD (ROUTINE X 2)  CULTURE, BLOOD (ROUTINE X 2)  PROTIME-INR  CBC  CREATININE, SERUM  BASIC METABOLIC PANEL  CBC  I-STAT CG4 LACTIC ACID, ED  I-STAT CG4 LACTIC ACID, ED    EKG None  Radiology Dg Foot Complete Left  Result Date: 03/25/2018 CLINICAL DATA:  Nonhealing ulcer on the distal aspect of the left foot EXAM: LEFT FOOT - COMPLETE 3+ VIEW  COMPARISON:  None. FINDINGS: A soft tissue ulcer is noted along the plantar aspect of the foot consistent with the given clinical history. No bony erosion is identified to suggest osteomyelitis. No fracture or dislocation is seen. IMPRESSION: Soft tissue ulcer without changes of osteomyelitis. Electronically Signed   By: Alcide Clever M.D.   On: 03/25/2018 15:15    Procedures Procedures (including critical care time)  Medications Ordered in ED Medications  cefTRIAXone (ROCEPHIN) 2 g in sodium chloride 0.9 % 100 mL IVPB (0 g Intravenous Stopped 03/25/18 1746)  vancomycin (VANCOCIN) 2,000 mg in sodium chloride 0.9 % 500 mL IVPB (2,000 mg Intravenous New Bag/Given 03/25/18 1750)  vancomycin (VANCOCIN) IVPB 1000 mg/200 mL premix (has no administration in time range)  aspirin tablet 81 mg (has no administration in time range)  pantoprazole (PROTONIX) EC tablet 40 mg (has no administration in time range)  fenofibrate tablet 160 mg (has no administration in time range)  Amphetamine Sulfate TABS 20 mg (has no administration in time range)  DULoxetine (CYMBALTA) DR capsule 60 mg (has no administration in time range)  zolpidem (AMBIEN) tablet 10 mg (has no administration in time range)  calcium carbonate (TUMS - dosed in mg elemental calcium) chewable tablet 200 mg of elemental calcium (has no administration in time range)  gabapentin (NEURONTIN) tablet 800 mg (has no administration in time range)  insulin aspart (novoLOG) injection 0-15 Units (has no administration in time range)  enoxaparin (LOVENOX) injection 40 mg (has no administration in time range)  0.9 %  sodium chloride infusion (has no administration in time range)  bisacodyl (DULCOLAX) EC tablet 5 mg (has no administration in time range)  Insulin Glargine (LANTUS) Solostar Pen 50 Units (has no administration in time range)  insulin aspart (novoLOG) injection 15 Units (has no administration in time range)  sodium chloride 0.9 % bolus 1,000 mL  (0 mLs Intravenous Stopped 03/25/18 1900)    Followed by  sodium chloride 0.9 % bolus 1,000 mL (0 mLs Intravenous Stopped 03/25/18 1815)  gabapentin (NEURONTIN) capsule 800 mg (800 mg Oral Given 03/25/18 1815)  acetaminophen (TYLENOL)  tablet 650 mg (650 mg Oral Given 03/25/18 1826)  sodium chloride 0.9 % bolus 1,000 mL (0 mLs Intravenous Stopped 03/25/18 1945)     Initial Impression / Assessment and Plan / ED Course  I have reviewed the triage vital signs and the nursing notes.  Pertinent labs & imaging results that were available during my care of the patient were reviewed by me and considered in my medical decision making (see chart for details).    [name] is a 52 y.o. male who presents to ED for pain and swelling to LLE over the last several days. Hx of DM. Exam concerning for cellulitis. He was febrile with leukocytosis and elevated lactic acid. Initial lactic of 2.14. Repeat lactic was obtained while in waiting room and did increase to 2.37. No intervention had been done at that time. Blood cx's were obtained. Upon arrival to the room, weight-based fluids and ABX were initiated. X-ray without signs of osteo. Hospitalist consulted who will admit.    Final Clinical Impressions(s) / ED Diagnoses   Final diagnoses:  Cellulitis of left lower extremity  SIRS (systemic inflammatory response syndrome) [facility])    ED Discharge Orders    None       Winfrey Chillemi, Chase Picket, PA-C 03/25/18 1948    Tegeler, Canary Brim, MD 03/26/18 [id number]

## 2018-03-25 NOTE — Progress Notes (Signed)
Pharmacy Antibiotic Note  [name] is a 51 y.o. male admitted on 03/25/2018 with L foot cellulitis. Noted purulent drainage. Pharmacy has been consulted for Vancomycin dosing. WBC up to 10.8. LA 2.37. Tm 101.7.  Plan: Vancomycin 2gm now then 1gm IV q12h Will f/u micro data, renal function, and pt's clinical condition Vanc trough prn   Height: 6\' 4"  (193 cm) Weight: 245 lb (111.1 kg) IBW/kg (Calculated) : 86.8  Temp (24hrs), Avg:100 F (37.8 C), Min:98.2 F (36.8 C), Max:101.7 F (38.7 C)  Recent Labs  Lab 03/25/18 1456 03/25/18 1505 03/25/18 1637  WBC 10.8*  --   --   CREATININE 1.19  --   --   LATICACIDVEN  --  2.14* 2.37*    Estimated Creatinine Clearance: 96.9 mL/min (by C-G formula based on SCr of 1.19 mg/dL).    No Known Allergies  Antimicrobials this admission: 10/24 Vancomycin >>   Dose adjustments this admission: N/A  Microbiology results: 10/24 BCx:   Thank you for allowing pharmacy to be a part of this patient's care.  Christoper Fabian, PharmD, BCPS Clinical pharmacist  **Pharmacist phone directory can now be found on [email or web address] ([email or web address]).  Listed under [facility] 03/25/2018 5:02 PM

## 2018-03-25 NOTE — ED Triage Notes (Signed)
Pt presents with wound to bottom of L foot.  Pt reports area began as a blister, unsure if he stepped on something at the beach [place], with gradual worsening of swelling, redness and warmth.  Pt reports white drainage, reports low grade fevers.

## 2018-03-25 NOTE — ED Provider Notes (Signed)
Patient placed in Quick Look pathway, seen and evaluated   Chief Complaint: skin infection  HPI: [name] is a 52 y.o. male who presents to the ED with left foot pain and wound and fever. Patient reports the wound to bottom of L foot began as a blister, unsure if he stepped on something at the beach July 2 because he is diabetic and doesn't always feel pain, gradual worsening with swelling, redness and warmth.  Pt reports white drainage, reports low grade fevers.  ROS: Skin: wound left foot  Physical Exam:  BP (!) 157/84 (BP Location: Left Arm)   Pulse 92   Temp (!) 101.7 F (38.7 C) (Oral)   Resp 20   Ht 6\' 4"  (1.93 m)   Wt 111.1 kg   SpO2 100%   BMI 29.82 kg/m    Gen: No distress  Neuro: Awake and Alert  Skin: wound to plantar aspect of the left foot with drainage, dorsum of the left foot with erythema and streaking     Initiation of care has begun. The patient has been counseled on the process, plan, and necessity for staying for the completion/evaluation, and the remainder of the medical screening examination    Janne Napoleon, NP 03/25/18 1456    Maia Plan, MD 03/25/18 Rickey Primus

## 2018-03-25 NOTE — H&P (Addendum)
History and Physical    DOA: 03/25/2018  PCP: Ananias Pilgrim, MD  Patient coming from: Home  Chief Complaint: Left foot pain x 4 days  HPI: [name] is a 52 y.o. male with history h/o diabetes, anxiety/depression, hyperlipidemia, CIDP, chronic left foot ulcer presents with complaints of left dorsal foot throbbing pain, 6/10 since the weekend extending into the left lower leg.  Today he noticed redness along the dorsum of the left foot which brought him to the hospital.  He is also been having fevers for the last 3 days with T-max of 101.7.  He says the chronic ulcer on plantar aspect of his left foot started after he walked on the beach around July 2 of this year resulting in a blister and subsequently a large open ulcer which is now improved.  He has followed [facility] as outpatient since July.  He states he called his podiatrist office this morning but was advised that they are closed until the end of the month.  He presented to the ED and lab work showed WBC of 10.6, lactate of 2.14, x-ray negative for osteomyelitis.  He received vancomycin and Rocephin in the ED.  He is requested to be admitted for further evaluation and management.  Of note, patient does endorse travel history to [place] (drove in the first week of October back-and-forth)   Review of Systems: As per HPI otherwise 10 point review of systems negative.    Past Medical History:  Diagnosis Date  . ADHD (attention deficit hyperactivity disorder)   . Anxiety and depression   . Candida esophagitis (HCC)    in setting of DKA/hyperglycemia  . CIDP (chronic inflammatory demyelinating polyneuropathy) (HCC)   . Depression   . Diabetes type 2, controlled (HCC)   . Diverticulosis   . Hyperlipidemia   . Kidney stones   . Pancreatitis   . Stomach ulcer   . Vitamin D deficiency     Past Surgical History:  Procedure Laterality Date  . ANKLE SURGERY    . CERVICAL DISC SURGERY     C4-C5  . ELBOW SURGERY      . ESOPHAGOGASTRODUODENOSCOPY N/A 07/16/2017   Procedure: ESOPHAGOGASTRODUODENOSCOPY (EGD);  Surgeon: [name], MD;  Location: [facility] ENDOSCOPY;  Service: Endoscopy;  Laterality: N/A;  . RETINAL DETACHMENT SURGERY    . RHINOPLASTY    . VASECTOMY      Social history:  reports that he has quit smoking. He has quit using smokeless tobacco. He reports that he drinks alcohol. He reports that he does not use drugs.   Allergies  Allergen Reactions  . Lithium Other (See Comments)    "Made me feel like I was walking in chest-deep water. I didn't like it."  . Adderall [Amphetamine-Dextroamphetamine] Palpitations and Other (See Comments)    Hyperactivity, also  . Ritalin [Methylphenidate] Palpitations and Other (See Comments)    Hyperactivity, also    Family History  Problem Relation Age of Onset  . Stroke Father   . Breast cancer Mother   . Colon cancer Neg Hx       Prior to Admission medications   Medication Sig Start Date End Date Taking? Authorizing Provider  Amphetamine Sulfate (EVEKEO) 10 MG TABS Take 20 mg by mouth 2 (two) times daily.    Yes [provider]  calcium carbonate (TUMS - DOSED IN MG ELEMENTAL CALCIUM) 500 MG chewable tablet Chew 1 tablet by mouth as needed for indigestion or heartburn.   Yes  [provider]  Continuous Blood Gluc Receiver (FREESTYLE LIBRE 14 DAY READER) DEVI every 14 (fourteen) days.  01/05/18  Yes [provider]  DULoxetine (CYMBALTA) 60 MG capsule Take 60 mg by mouth daily. 02/26/18  Yes [provider]  fenofibrate 160 MG tablet Take 160 mg by mouth daily.   Yes [provider]  gabapentin (NEURONTIN) 800 MG tablet Take 800 mg by mouth 3 (three) times daily.   Yes [provider]  insulin aspart (NOVOLOG FLEXPEN) 100 UNIT/ML FlexPen Inject 22-32 Units into the skin See admin instructions. Inject 22-32 units into the skin three to four times a day before meals, per sliding scale   Yes [provider]  Insulin Glargine (LANTUS SOLOSTAR) 100 UNIT/ML Solostar Pen Inject 70 Units into the skin at bedtime.   Yes [provider]  zolpidem (AMBIEN) 10 MG tablet Take 10 mg by mouth at bedtime as needed for sleep.    Yes [provider]  amoxicillin-clavulanate (AUGMENTIN) 875-125 MG tablet Take 1 tablet by mouth 2 (two) times daily. Patient not taking: Reported on 03/25/2018 01/07/18   Vivi Barrack, DPM  aspirin 81 MG tablet Take 1 tablet (81 mg total) by mouth daily. Patient not taking: Reported on 03/25/2018 07/17/17   Berton Mount I, MD  fluconazole (DIFLUCAN) 100 MG tablet Take 2 tablets by mouth on day 1 then take 1 tablet daily until gone Patient not taking: Reported on 03/25/2018 07/22/17   [name], MD  insulin NPH-regular Human (NOVOLIN 70/30) (70-30) 100 UNIT/ML injection Insulin NPH 40 units in am and 30 Units at night time. Patient not taking: Reported on 03/25/2018 07/16/17   Berton Mount I, MD  mupirocin ointment (BACTROBAN) 2 % Apply 1 application topically 2 (two) times daily. Patient not taking: Reported on 03/25/2018 01/07/18   Vivi Barrack, DPM  pantoprazole (PROTONIX) 40 MG tablet Take 1 tablet (40 mg total) by mouth daily before breakfast. Patient not taking: Reported on 03/25/2018 07/17/17   Barnetta Chapel, MD    Physical Exam: Vitals:   03/25/18 1800 03/25/18 1815 03/25/18 1830 03/25/18 1900  BP: (!) 156/87  (!) 175/98   Pulse: 88 89 86 85  Resp: 18 18 (!) 21 17  Temp:  100.2 F (37.9 C)    TempSrc:  Oral    SpO2: 100% 100% 100% 100%  Weight:      Height:        Constitutional: NAD, calm, comfortable Vitals:   03/25/18 1800 03/25/18 1815 03/25/18 1830 03/25/18 1900  BP: (!) 156/87  (!) 175/98   Pulse: 88 89 86 85  Resp: 18 18 (!) 21 17  Temp:  100.2 F (37.9 C)    TempSrc:  Oral    SpO2: 100% 100% 100% 100%  Weight:      Height:       Eyes: PERRL, lids and conjunctivae normal ENMT: Mucous  membranes are moist. Posterior pharynx clear of any exudate or lesions.Normal dentition.  Neck: normal, supple, no masses, no thyromegaly Respiratory: clear to auscultation bilaterally, no wheezing, no crackles. Normal respiratory effort. No accessory muscle use.  Cardiovascular: Regular rate and rhythm, no murmurs / rubs / gallops. No extremity edema. 2+ pedal pulses. No carotid bruits.  Abdomen: no tenderness, no masses palpated. No hepatosplenomegaly. Bowel sounds positive.  Musculoskeletal: no clubbing / cyanosis. No joint deformity upper and lower extremities. Good ROM, no contractures. Normal muscle tone.  Neurologic: CN 2-12 grossly intact.  Decreased  sensations to crude touch along both feet, DTR normal. Strength 5/5 in all 4.  Psychiatric: Normal judgment and insight. Alert and oriented x 3. Normal mood.  SKIN/catheters: Tenderness along medial saphenous vein in the lower leg, redness along the dorsum of the left foot (marked with a pen), chronic 1 x 1 cm ulcer along the plantar aspect of left foot with no active drainage.  Labs on Admission: I have personally reviewed following labs and imaging studies  CBC: Recent Labs  Lab 03/25/18 1456  WBC 10.8*  NEUTROABS 8.4*  HGB 13.4  HCT 43.1  MCV 89.6  PLT 276   Basic Metabolic Panel: Recent Labs  Lab 03/25/18 1456  NA 130*  K 4.0  CL 96*  CO2 24  GLUCOSE 406*  BUN 12  CREATININE 1.19  CALCIUM 9.4   GFR: Estimated Creatinine Clearance: 96.9 mL/min (by C-G formula based on SCr of 1.19 mg/dL). Liver Function Tests: Recent Labs  Lab 03/25/18 1456  AST 16  ALT 17  ALKPHOS 90  BILITOT 1.0  PROT 7.3  ALBUMIN 3.6   No results for input(s): LIPASE, AMYLASE in the last 168 hours. No results for input(s): AMMONIA in the last 168 hours. Coagulation Profile: Recent Labs  Lab 03/25/18 1456  INR 1.01   Cardiac Enzymes: No results for input(s): CKTOTAL, CKMB, CKMBINDEX, TROPONINI in the last 168 hours. BNP (last 3  results) No results for input(s): PROBNP in the last 8760 hours. HbA1C: No results for input(s): HGBA1C in the last 72 hours. CBG: No results for input(s): GLUCAP in the last 168 hours. Lipid Profile: No results for input(s): CHOL, HDL, LDLCALC, TRIG, CHOLHDL, LDLDIRECT in the last 72 hours. Thyroid Function Tests: No results for input(s): TSH, T4TOTAL, FREET4, T3FREE, THYROIDAB in the last 72 hours. Anemia Panel: No results for input(s): VITAMINB12, FOLATE, FERRITIN, TIBC, IRON, RETICCTPCT in the last 72 hours. Urine analysis:    Component Value Date/Time   COLORURINE YELLOW 03/25/2018 1736   APPEARANCEUR CLEAR 03/25/2018 1736   LABSPEC 1.027 03/25/2018 1736   PHURINE 5.0 03/25/2018 1736   GLUCOSEU >=500 (A) 03/25/2018 1736   HGBUR NEGATIVE 03/25/2018 1736   BILIRUBINUR NEGATIVE 03/25/2018 1736   KETONESUR NEGATIVE 03/25/2018 1736   PROTEINUR NEGATIVE 03/25/2018 1736   NITRITE NEGATIVE 03/25/2018 1736   LEUKOCYTESUR NEGATIVE 03/25/2018 1736    Radiological Exams on Admission: Dg Foot Complete Left  Result Date: 03/25/2018 CLINICAL DATA:  Nonhealing ulcer on the distal aspect of the left foot EXAM: LEFT FOOT - COMPLETE 3+ VIEW COMPARISON:  None. FINDINGS: A soft tissue ulcer is noted along the plantar aspect of the foot consistent with the given clinical history. No bony erosion is identified to suggest osteomyelitis. No fracture or dislocation is seen. IMPRESSION: Soft tissue ulcer without changes of osteomyelitis. Electronically Signed   By: Alcide Clever M.D.   On: 03/25/2018 15:15    Assessment and Plan:   1.  Left foot cellulitis with thrombo-phlebitis: Lactate slightly elevated with mild leukocytosis and fever meeting sepsis criteria.  Will admit with IV antibiotics.  Cellulitic area marked for monitoring progress.  Given travel history, will obtain Doppler lower extremity rule out DVT although appears to be superficial thrombophlebitis. F/U blood cultures Will order cold  compresses/antibiotics and DVT prophylaxis  2.  Diabetes mellitus: Patient does have hypoglycemia in the setting of infection.  Adjust insulin according to blood glucose levels.  No evidence of DKA noted.  Patient states he was taking Lantus 70 units at bedtime  and 2225 units of NovoLog with meals based on sliding scale but also reports noncompliance with diabetic diet at home.  He states he did not take insulin yesterday but took this morning.  When asked about 2 insulin regimens listed in the home medication list, he states last time he was hospitalized he was transitioned to 70/30 40 units every morning and 30 every afternoon which worked well for him but changed back to Lantus as more feasible.  Will admit with 50 units of Lantus at bedtime and 15 units of pre-meal insulin (adjusting to diet restrictions while here), titrate further as needed.  3.  Anxiety/depression: Resume home medications  4.  Hyponatremia: Likely pseudohyponatremia in the setting of hyperglycemia.   DVT prophylaxis: Lovenox  Code Status: Full code  Family Communication: Discussed with patient. Health care proxy would be his sister Kirt Boys Consults called: None. Consider [facility] if needed. Wound care consult for now Admission status:  Patient admitted as inpatient as anticipated LOS greater than 2 midnights    Alessandra Bevels MD [facility] Pager [phone]  If 7PM-7AM, please contact night-coverage [email or web address] Password [facility]  03/25/2018, 7:18 PM

## 2018-03-26 ENCOUNTER — Telehealth: Payer: Self-pay | Admitting: *Deleted

## 2018-03-26 ENCOUNTER — Inpatient Hospital Stay (HOSPITAL_COMMUNITY): Payer: BLUE CROSS/BLUE SHIELD

## 2018-03-26 DIAGNOSIS — A419 Sepsis, unspecified organism: Principal | ICD-10-CM

## 2018-03-26 DIAGNOSIS — E1052 Type 1 diabetes mellitus with diabetic peripheral angiopathy with gangrene: Secondary | ICD-10-CM

## 2018-03-26 DIAGNOSIS — R52 Pain, unspecified: Secondary | ICD-10-CM

## 2018-03-26 DIAGNOSIS — M86679 Other chronic osteomyelitis, unspecified ankle and foot: Secondary | ICD-10-CM

## 2018-03-26 LAB — CBC
HCT: 39 % (ref 39.0–52.0)
Hemoglobin: 12.6 g/dL — ABNORMAL LOW (ref 13.0–17.0)
MCH: 28.7 pg (ref 26.0–34.0)
MCHC: 32.3 g/dL (ref 30.0–36.0)
MCV: 88.8 fL (ref 80.0–100.0)
PLATELETS: 242 10*3/uL (ref 150–400)
RBC: 4.39 MIL/uL (ref 4.22–5.81)
RDW: 13.3 % (ref 11.5–15.5)
WBC: 10.2 10*3/uL (ref 4.0–10.5)
nRBC: 0 % (ref 0.0–0.2)

## 2018-03-26 LAB — BASIC METABOLIC PANEL
Anion gap: 8 (ref 5–15)
BUN: 8 mg/dL (ref 6–20)
CALCIUM: 8.5 mg/dL — AB (ref 8.9–10.3)
CHLORIDE: 103 mmol/L (ref 98–111)
CO2: 26 mmol/L (ref 22–32)
CREATININE: 0.87 mg/dL (ref 0.61–1.24)
Glucose, Bld: 188 mg/dL — ABNORMAL HIGH (ref 70–99)
POTASSIUM: 3.6 mmol/L (ref 3.5–5.1)
SODIUM: 137 mmol/L (ref 135–145)

## 2018-03-26 LAB — GLUCOSE, CAPILLARY
GLUCOSE-CAPILLARY: 120 mg/dL — AB (ref 70–99)
Glucose-Capillary: 202 mg/dL — ABNORMAL HIGH (ref 70–99)
Glucose-Capillary: 162 mg/dL — ABNORMAL HIGH (ref 70–99)
Glucose-Capillary: 110 mg/dL — ABNORMAL HIGH (ref 70–99)
Glucose-Capillary: 149 mg/dL — ABNORMAL HIGH (ref 70–99)

## 2018-03-26 LAB — SEDIMENTATION RATE: SED RATE: 40 mm/h — AB (ref 0–16)

## 2018-03-26 LAB — LACTIC ACID, PLASMA: LACTIC ACID, VENOUS: 1.1 mmol/L (ref 0.5–1.9)

## 2018-03-26 MED ORDER — INSULIN GLARGINE 100 UNIT/ML ~~LOC~~ SOLN
60.0000 [IU] | Freq: Every day | SUBCUTANEOUS | Status: DC
  Administered 2018-03-26 – 2018-03-29 (×4): 60 [IU] via SUBCUTANEOUS
  Filled 2018-03-26 (×5): qty 0.6

## 2018-03-26 MED ORDER — GADOBUTROL 1 MMOL/ML IV SOLN
10.0000 mL | Freq: Once | INTRAVENOUS | Status: AC | PRN
  Administered 2018-03-26: 10 mL via INTRAVENOUS

## 2018-03-26 MED ORDER — CHLORHEXIDINE GLUCONATE CLOTH 2 % EX PADS
6.0000 | MEDICATED_PAD | Freq: Once | CUTANEOUS | Status: AC
  Administered 2018-03-26: 6 via TOPICAL

## 2018-03-26 NOTE — Consult Note (Signed)
Reason for Consult: wound/cellulitis Referring Physician: Dr. Sloan Leiter, MD  [name] is an 52 y.o. male.  HPI: [name] was admitted last night for cellulitis, ulceration to his left foot.  I last saw him in August for a wound on his foot he states that he did not follow-up because the wound is doing well.  He has been training for a bike ride he recently just to the 100 mile bike ride beginning of the month and he is doing well.  Over the last week he started to notice recurrence of the wound as well as some increased pain to the foot.  He started to develop fevers and chills and he went to the emergency room where he was admitted.  He also states he has not been checking his blood sugar because his glucose monitoring device came off he was unable to get a new one.  Overall he states he is feeling better since starting antibiotics.  Fevers, chills have improved greatly.  Past Medical History:  Diagnosis Date  . ADHD (attention deficit hyperactivity disorder)   . Anxiety and depression   . Candida esophagitis ([place])    in setting of DKA/hyperglycemia  . CIDP (chronic inflammatory demyelinating polyneuropathy) ([place])   . Depression   . Diabetes type 2, controlled ([place])   . Diverticulosis   . Hyperlipidemia   . Kidney stones   . Pancreatitis   . Stomach ulcer   . Vitamin D deficiency     Past Surgical History:  Procedure Laterality Date  . ANKLE SURGERY    . CERVICAL DISC SURGERY     C4-C5  . ELBOW SURGERY    . ESOPHAGOGASTRODUODENOSCOPY N/A 07/16/2017   Procedure: ESOPHAGOGASTRODUODENOSCOPY (EGD);  Surgeon: Gatha Mayer, MD;  Location: [facility] ENDOSCOPY;  Service: Endoscopy;  Laterality: N/A;  . RETINAL DETACHMENT SURGERY    . RHINOPLASTY    . VASECTOMY      Family History  Problem Relation Age of Onset  . Stroke Father   . Breast cancer Mother   . Colon cancer Neg Hx     Social History:  reports that he has quit smoking. He has quit using smokeless tobacco. He  reports that he drinks alcohol. He reports that he does not use drugs.  Allergies:  Allergies  Allergen Reactions  . Lithium Other (See Comments)    "Made me feel like I was walking in chest-deep water. I didn't like it."  . Adderall [Amphetamine-Dextroamphetamine] Palpitations and Other (See Comments)    Hyperactivity, also  . Ritalin [Methylphenidate] Palpitations and Other (See Comments)    Hyperactivity, also    Medications: I have reviewed the patient's current medications.  Results for orders placed or performed during the hospital encounter of 03/25/18 (from the past 48 hour(s))  Culture, blood (Routine x 2)     Status: None (Preliminary result)   Collection Time: 03/25/18  2:50 PM  Result Value Ref Range   Specimen Description SITE NOT SPECIFIED    Special Requests      BOTTLES DRAWN AEROBIC ONLY Blood Culture adequate volume   Culture      NO GROWTH < 24 HOURS Performed at [facility] [address] [place], [place] [address]    Report Status PENDING   Comprehensive metabolic panel     Status: Abnormal   Collection Time: 03/25/18  2:56 PM  Result Value Ref Range   Sodium 130 (L) 135 - 145 mmol/L   Potassium 4.0 3.5 -  5.1 mmol/L   Chloride 96 (L) 98 - 111 mmol/L   CO2 24 22 - 32 mmol/L   Glucose, Bld 406 (H) 70 - 99 mg/dL   BUN 12 6 - 20 mg/dL   Creatinine, Ser 1.19 0.61 - 1.24 mg/dL   Calcium 9.4 8.9 - 10.3 mg/dL   Total Protein 7.3 6.5 - 8.1 g/dL   Albumin 3.6 3.5 - 5.0 g/dL   AST 16 15 - 41 U/L   ALT 17 0 - 44 U/L   Alkaline Phosphatase 90 38 - 126 U/L   Total Bilirubin 1.0 0.3 - 1.2 mg/dL   GFR calc non Af Amer >60 >60 mL/min   GFR calc Af Amer >60 >60 mL/min    Comment: (NOTE) The eGFR has been calculated using the CKD EPI equation. This calculation has not been validated in all clinical situations. eGFR's persistently <60 mL/min signify possible Chronic Kidney Disease.    Anion gap 10 5 - 15    Comment: Performed at [facility] [address], [place], [place] [address]  CBC with Differential     Status: Abnormal   Collection Time: 03/25/18  2:56 PM  Result Value Ref Range   WBC 10.8 (H) 4.0 - 10.5 K/uL   RBC 4.81 4.22 - 5.81 MIL/uL   Hemoglobin 13.4 13.0 - 17.0 g/dL   HCT 43.1 39.0 - 52.0 %   MCV 89.6 80.0 - 100.0 fL   MCH 27.9 26.0 - 34.0 pg   MCHC 31.1 30.0 - 36.0 g/dL   RDW 13.2 11.5 - 15.5 %   Platelets 276 150 - 400 K/uL   nRBC 0.0 0.0 - 0.2 %   Neutrophils Relative % 77 %   Neutro Abs 8.4 (H) 1.7 - 7.7 K/uL   Lymphocytes Relative 13 %   Lymphs Abs 1.4 0.7 - 4.0 K/uL   Monocytes Relative 7 %   Monocytes Absolute 0.7 0.1 - 1.0 K/uL   Eosinophils Relative 2 %   Eosinophils Absolute 0.2 0.0 - 0.5 K/uL   Basophils Relative 1 %   Basophils Absolute 0.1 0.0 - 0.1 K/uL   Immature Granulocytes 0 %   Abs Immature Granulocytes 0.04 0.00 - 0.07 K/uL    Comment: Performed at [facility] [address] [place], [place] [address]  Protime-INR     Status: None   Collection Time: 03/25/18  2:56 PM  Result Value Ref Range   Prothrombin Time 13.2 11.4 - 15.2 seconds   INR 1.01     Comment: Performed at [facility], [place] [address], [place], [place] [address]  I-Stat CG4 Lactic Acid, ED     Status: Abnormal   Collection Time: 03/25/18  3:05 PM  Result Value Ref Range   Lactic Acid, Venous 2.14 (HH) 0.5 - 1.9 mmol/L   Comment NOTIFIED PHYSICIAN   Culture, blood (Routine x 2)     Status: None (Preliminary result)   Collection Time: 03/25/18  4:24 PM  Result Value Ref Range   Specimen Description BLOOD RIGHT FOREARM    Special Requests      BOTTLES DRAWN AEROBIC AND ANAEROBIC Blood Culture adequate volume   Culture      NO GROWTH < 24 HOURS Performed at [facility], [place] [address] [place], [place] [address]    Report Status PENDING   I-Stat CG4 Lactic Acid, ED     Status: Abnormal   Collection Time: 03/25/18  4:37 PM  Result Value Ref Range   Lactic Acid, Venous 2.37 (HH) 0.5 - 1.9  mmol/L   Comment NOTIFIED PHYSICIAN   Urinalysis, Routine w reflex microscopic     Status: Abnormal   Collection Time: 03/25/18  5:36 PM  Result Value Ref Range   Color, Urine YELLOW YELLOW   APPearance CLEAR CLEAR   Specific Gravity, Urine 1.027 1.005 - 1.030   pH 5.0 5.0 - 8.0   Glucose, UA >=500 (A) NEGATIVE mg/dL   Hgb urine dipstick NEGATIVE NEGATIVE   Bilirubin Urine NEGATIVE NEGATIVE   Ketones, ur NEGATIVE NEGATIVE mg/dL   Protein, ur NEGATIVE NEGATIVE mg/dL   Nitrite NEGATIVE NEGATIVE   Leukocytes, UA NEGATIVE NEGATIVE   RBC / HPF 0-5 0 - 5 RBC/hpf   WBC, UA 0-5 0 - 5 WBC/hpf   Bacteria, UA NONE SEEN NONE SEEN   Squamous Epithelial / LPF 0-5 0 - 5   Mucus PRESENT    Hyaline Casts, UA PRESENT     Comment: Performed at [facility], [address] [place], [place] [address]  Glucose, capillary     Status: Abnormal   Collection Time: 03/25/18  9:03 PM  Result Value Ref Range   Glucose-Capillary 241 (H) 70 - 99 mg/dL  Basic metabolic panel     Status: Abnormal   Collection Time: 03/26/18  4:45 AM  Result Value Ref Range   Sodium 137 135 - 145 mmol/L   Potassium 3.6 3.5 - 5.1 mmol/L   Chloride 103 98 - 111 mmol/L   CO2 26 22 - 32 mmol/L   Glucose, Bld 188 (H) 70 - 99 mg/dL   BUN 8 6 - 20 mg/dL   Creatinine, Ser 0.87 0.61 - 1.24 mg/dL   Calcium 8.5 (L) 8.9 - 10.3 mg/dL   GFR calc non Af Amer >60 >60 mL/min   GFR calc Af Amer >60 >60 mL/min    Comment: (NOTE) The eGFR has been calculated using the CKD EPI equation. This calculation has not been validated in all clinical situations. eGFR's persistently <60 mL/min signify possible Chronic Kidney Disease.    Anion gap 8 5 - 15    Comment: Performed at [facility] [address], [place], [place] [address]  CBC     Status: Abnormal   Collection Time: 03/26/18  4:45 AM  Result Value Ref Range   WBC 10.2 4.0 - 10.5 K/uL   RBC 4.39 4.22 - 5.81 MIL/uL   Hemoglobin 12.6 (L) 13.0 - 17.0 g/dL   HCT 39.0 39.0  - 52.0 %   MCV 88.8 80.0 - 100.0 fL   MCH 28.7 26.0 - 34.0 pg   MCHC 32.3 30.0 - 36.0 g/dL   RDW 13.3 11.5 - 15.5 %   Platelets 242 150 - 400 K/uL   nRBC 0.0 0.0 - 0.2 %    Comment: Performed at [facility], [place] [address], [place], [place] [address]  Lactic acid, plasma     Status: None   Collection Time: 03/26/18  6:33 AM  Result Value Ref Range   Lactic Acid, Venous 1.1 0.5 - 1.9 mmol/L    Comment: Performed at [facility] [address], [place], [place] [address]  Glucose, capillary     Status: Abnormal   Collection Time: 03/26/18  8:08 AM  Result Value Ref Range   Glucose-Capillary 202 (H) 70 - 99 mg/dL  Sedimentation rate     Status: Abnormal   Collection  Time: 03/26/18  9:40 AM  Result Value Ref Range   Sed Rate 40 (H) 0 - 16 mm/hr    Comment: Performed at [facility] [address], [place],  [address]  Glucose, capillary     Status: Abnormal   Collection Time: 03/26/18 11:58 AM  Result Value Ref Range   Glucose-Capillary 162 (H) 70 - 99 mg/dL  Glucose, capillary     Status: Abnormal   Collection Time: 03/26/18  3:40 PM  Result Value Ref Range   Glucose-Capillary 110 (H) 70 - 99 mg/dL  Glucose, capillary     Status: Abnormal   Collection Time: 03/26/18  4:46 PM  Result Value Ref Range   Glucose-Capillary 149 (H) 70 - 99 mg/dL    Mr Foot Left W Wo Contrast  Result Date: 03/26/2018 CLINICAL DATA:  Infection of the left foot with bowel smelling ulcer along the plantar aspect. EXAM: MRI OF THE LEFT FOREFOOT WITHOUT AND WITH CONTRAST TECHNIQUE: Multiplanar, multisequence MR imaging of the left forefoot was performed both before and after administration of intravenous contrast. CONTRAST:  10 mL Gadavist COMPARISON:  None. FINDINGS: Bones/Joint/Cartilage Soft tissue ulcer overlying the fourth MTP joint. Mild marrow edema in the fourth metatarsal head and base of the fourth proximal phalanx with a small joint effusion which may reflect reactive  changes versus septic arthritis. No bone destruction. No fracture or dislocation. Normal alignment. No other joint effusion. Ligaments Collateral ligaments are intact.  Lisfranc ligament is intact. Muscles and Tendons Flexor, peroneal and extensor compartment tendons are intact. T2 hyperintense signal throughout the plantar musculature likely neurogenic. Soft tissue No fluid collection or hematoma. No soft tissue mass. Severe soft tissue edema in the dorsal aspect of the foot with enhancement most concerning for cellulitis. IMPRESSION: 1. Soft tissue ulcer overlying the fourth MTP joint. Mild marrow edema in the fourth metatarsal head and base of the fourth proximal phalanx with a small joint effusion which may reflect reactive changes versus septic arthritis. No drainable fluid collection to suggest an abscess. 2. Cellulitis along the dorsal aspect of the foot. Electronically Signed   By: Kathreen Devoid   On: 03/26/2018 14:57   Dg Foot Complete Left  Result Date: 03/25/2018 CLINICAL DATA:  Nonhealing ulcer on the distal aspect of the left foot EXAM: LEFT FOOT - COMPLETE 3+ VIEW COMPARISON:  None. FINDINGS: A soft tissue ulcer is noted along the plantar aspect of the foot consistent with the given clinical history. No bony erosion is identified to suggest osteomyelitis. No fracture or dislocation is seen. IMPRESSION: Soft tissue ulcer without changes of osteomyelitis. Electronically Signed   By: Inez Catalina M.D.   On: 03/25/2018 15:15    ROS Blood pressure (!) 160/88, pulse 74, temperature 98.7 F (37.1 C), temperature source Oral, resp. rate 18, height '6\' 4"'$  (1.93 m), weight 111.1 kg, SpO2 98 %. Physical Exam General: AAO x3, NAD  Dermatological: Ulceration present on the left foot submetatarsal 4 area small purulent drainage identified.  The wound does probe on the interspaces but not probing to bone.  There is significant erythema to the dorsal aspect of the foot without any identifiable fluid  collection or fluctuation or crepitation.  Compared to the markings that he had the cellulitis is mildly improved.  He has a superficial wound to the posterior aspect of the heel from where he was wearing a shoe and is rubbing as well.  No other open lesions are identified.  Vascular: Dorsalis Pedis artery  and Posterior Tibial artery pedal pulses are 2/4 bilateral with immedate capillary fill time.   Neruologic: Decreased sensation.  Musculoskeletal: Cavus foot type is present.   Assessment/Plan: Cellulitis left foot  -Labs, x-ray, MRI was reviewed.  We discussed the findings.  Overall he is improving with regards to fevers as well as lactic acid level as well as white blood cell count.  He is remained afebrile over the last several hours.  He appears to be stable and overall he is feeling much better.  I was able to probe the wound to the interspace but not to bone. Mild purulence expressed. Wound culture obtained. Given MRI findings of small joint effusion, mild edema he would likely benefit from surgery for bone biopsy/wound debridement. No drainable collection on MRI.   -Will plan for wound debridement, I&D, bone biopsy tomorrow. I discussed the surgery and postop course with the patient. We discussed all alternatives, risks, complications. He understands of the risks, including, but not limited to, spread of infection, need for further surgery, amputation, stroke, heart attack, death. No promises given.  -Continue IV abx -NPO after midnight  Trula Slade 03/26/2018, 4:59 PM   O: [phone]

## 2018-03-26 NOTE — Consult Note (Signed)
WOC Nurse wound consult note Dr. Jerral Ralph and I spoke at the nurse's station about this consult.  He stated he was going to order an MRI and pursue consulting with Dr. Ardelle Anton about this patient.  Apparently Dr. Ardelle Anton has been seeing this patient.  WOC will sign off for now in agreement with Dr. Jerral Ralph.  Helmut Muster, RN, MSN, CWOCN, CNS-BC, pager [phone]

## 2018-03-26 NOTE — Progress Notes (Addendum)
PROGRESS NOTE        PATIENT DETAILS Name: [name] Age: 52 y.o. Sex: male Date of Birth: 06-Oct-1965 Admit Date: 03/25/2018 Admitting Physician Guilford Shi, MD UXL:KGMWN, Ale[place], MD  Brief Narrative: Patient is a 52 y.o. 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  Thought to have a soft tissue infection and admitted to the hospitalist service.  Subjective: Dressing in the left foot is soaked at its plantar aspect-some foul-smelling purulent discharge evident.  Left foot apparently is unchanged per patient-apart from mild decrease in swelling and erythema.  Denies any chest pain or shortness of breath.  Assessment/Plan: Sepsis secondary to left foot soft tissue infection: Sepsis pathophysiology has somewhat improved-left foot minimally improved compared to yesterday-still significantly swollen and edematous.  Patient does have a ulcer in the plantar aspect-some foul-smelling purulent discharge is evident.  Continue vancomycin-check ESR and a MRI.  Have placed a call to patient's podiatrist-Dr. Olena Mater will await further recommendations.  Addendum 3:50 pm: Dr Smiley Houseman see patient later today  DM-1: CBG's on the higher side-increase Lantus to 60 units, continue SSI.  Follow.  Neuropathy/CIDP: Suspect related to diabetes-continue Neurontin and Cymbalta.  Dyslipidemia: Continue fenofibrate.  DVT Prophylaxis: Prophylactic Lovenox   Code Status: Full code   Family Communication: None at bedside  Disposition Plan: Remain inpatient-home when work-up complete-probably in the next few days.  Antimicrobial agents: Anti-infectives (From admission, onward)   Start     Dose/Rate Route Frequency Ordered Stop   03/26/18 0600  vancomycin (VANCOCIN) IVPB 1000 mg/200 mL premix     1,000 mg 200 mL/hr over 60 Minutes Intravenous Every  12 hours 03/25/18 1708     03/25/18 1715  vancomycin (VANCOCIN) 2,000 mg in sodium chloride 0.9 % 500 mL IVPB     2,000 mg 250 mL/hr over 120 Minutes Intravenous STAT 03/25/18 1702 03/25/18 1956   03/25/18 1700  vancomycin (VANCOCIN) IVPB 1000 mg/200 mL premix  Status:  Discontinued     1,000 mg 200 mL/hr over 60 Minutes Intravenous  Once 03/25/18 1655 03/25/18 1702   03/25/18 1700  cefTRIAXone (ROCEPHIN) 2 g in sodium chloride 0.9 % 100 mL IVPB  Status:  Discontinued     2 g 200 mL/hr over 30 Minutes Intravenous Every 24 hours 03/25/18 1655 03/26/18 0952      Procedures: None  CONSULTS:  None  Time spent: 25 minutes-Greater than 50% of this time was spent in counseling, explanation of diagnosis, planning of further management, and coordination of care.  MEDICATIONS: Scheduled Meds: . Amphetamine Sulfate  20 mg Oral BID  . aspirin  81 mg Oral Daily  . DULoxetine  60 mg Oral Daily  . enoxaparin (LOVENOX) injection  40 mg Subcutaneous Q24H  . fenofibrate  160 mg Oral Daily  . gabapentin  800 mg Oral TID  . insulin aspart  0-15 Units Subcutaneous TID WC  . insulin aspart  15 Units Subcutaneous TID WC  . insulin glargine  50 Units Subcutaneous QHS  . pantoprazole  40 mg Oral QAC breakfast   Continuous Infusions: . sodium chloride 125 mL/hr at 03/26/18 0347  . vancomycin 200 mL/hr at 03/26/18 0600   PRN Meds:.acetaminophen, bisacodyl, calcium carbonate, HYDROcodone-acetaminophen, zolpidem   PHYSICAL EXAM: Vital signs: Vitals:   03/25/18 1942 03/25/18 2000  03/25/18 2024 03/26/18 0506  BP: (!) 144/74 130/77 137/81 (!) 158/89  Pulse: 85 85 81 72  Resp: (!) 22 (!) '24 18 17  '$ Temp: 100.2 F (37.9 C)  99.4 F (37.4 C) 98 F (36.7 C)  TempSrc: Oral  Oral Oral  SpO2: 97% 98% 100% 100%  Weight:      Height:       Filed Weights   03/25/18 1441  Weight: 111.1 kg   Body mass index is 29.82 kg/m.   General appearance :Awake, alert, not in any distress. Speech Clear. Not  toxic Looking Eyes:, pupils equally reactive to light and accomodation,no scleral icterus.Pink conjunctiva HEENT: Atraumatic and Normocephalic Neck: supple, no JVD. No cervical lymphadenopathy. No thyromegaly Resp:Good air entry bilaterally, no added sounds  CVS: S1 S2 regular, no murmurs.  GI: Bowel sounds present, Non tender and not distended with no gaurding, rigidity or rebound.No organomegaly Extremities:-Wrapped-approximately 1.5 cm ulcer evident on the plantar aspect around the great toe head-foul-smelling purulent discharge evident.  Left foot appears erythematous (erythema well within the marked area) and swollen.  I do not feel any crepitus.  Right lower extremity appears benign.   Neurology:  speech clear,Non focal, sensation is grossly intact. Psychiatric: Normal judgment and insight. Alert and oriented x 3. Normal mood. Musculoskeletal:No digital cyanosis Skin:No Rash, warm and dry Wounds:N/A  I have personally reviewed following labs and imaging studies  LABORATORY DATA: CBC: Recent Labs  Lab 03/25/18 1456 03/26/18 0445  WBC 10.8* 10.2  NEUTROABS 8.4*  --   HGB 13.4 12.6*  HCT 43.1 39.0  MCV 89.6 88.8  PLT 276 182    Basic Metabolic Panel: Recent Labs  Lab 03/25/18 1456 03/26/18 0445  NA 130* 137  K 4.0 3.6  CL 96* 103  CO2 24 26  GLUCOSE 406* 188*  BUN 12 8  CREATININE 1.19 0.87  CALCIUM 9.4 8.5*    GFR: Estimated Creatinine Clearance: 132.5 mL/min (by C-G formula based on SCr of 0.87 mg/dL).  Liver Function Tests: Recent Labs  Lab 03/25/18 1456  AST 16  ALT 17  ALKPHOS 90  BILITOT 1.0  PROT 7.3  ALBUMIN 3.6   No results for input(s): LIPASE, AMYLASE in the last 168 hours. No results for input(s): AMMONIA in the last 168 hours.  Coagulation Profile: Recent Labs  Lab 03/25/18 1456  INR 1.01    Cardiac Enzymes: No results for input(s): CKTOTAL, CKMB, CKMBINDEX, TROPONINI in the last 168 hours.  BNP (last 3 results) No results for  input(s): PROBNP in the last 8760 hours.  HbA1C: No results for input(s): HGBA1C in the last 72 hours.  CBG: Recent Labs  Lab 03/25/18 2103 03/26/18 0808  GLUCAP 241* 202*    Lipid Profile: No results for input(s): CHOL, HDL, LDLCALC, TRIG, CHOLHDL, LDLDIRECT in the last 72 hours.  Thyroid Function Tests: No results for input(s): TSH, T4TOTAL, FREET4, T3FREE, THYROIDAB in the last 72 hours.  Anemia Panel: No results for input(s): VITAMINB12, FOLATE, FERRITIN, TIBC, IRON, RETICCTPCT in the last 72 hours.  Urine analysis:    Component Value Date/Time   COLORURINE YELLOW 03/25/2018 [place] 03/25/2018 1736   LABSPEC 1.027 03/25/2018 1736   PHURINE 5.0 03/25/2018 1736   GLUCOSEU >=500 (A) 03/25/2018 1736   HGBUR NEGATIVE 03/25/2018 1736   BILIRUBINUR NEGATIVE 03/25/2018 1736   KETONESUR NEGATIVE 03/25/2018 1736   PROTEINUR NEGATIVE 03/25/2018 1736   NITRITE NEGATIVE 03/25/2018 1736   LEUKOCYTESUR NEGATIVE 03/25/2018 1736  Sepsis Labs: Lactic Acid, Venous    Component Value Date/Time   LATICACIDVEN 1.1 03/26/2018 8309    MICROBIOLOGY: Recent Results (from the past 240 hour(s))  Culture, blood (Routine x 2)     Status: None (Preliminary result)   Collection Time: 03/25/18  2:50 PM  Result Value Ref Range Status   Specimen Description SITE NOT SPECIFIED  Final   Special Requests   Final    BOTTLES DRAWN AEROBIC ONLY Blood Culture adequate volume   Culture   Final    NO GROWTH < 24 HOURS Performed at [facility], [address], [place], [place] [address]    Report Status PENDING  Incomplete  Culture, blood (Routine x 2)     Status: None (Preliminary result)   Collection Time: 03/25/18  4:24 PM  Result Value Ref Range Status   Specimen Description BLOOD RIGHT FOREARM  Final   Special Requests   Final    BOTTLES DRAWN AEROBIC AND ANAEROBIC Blood Culture adequate volume   Culture   Final    NO GROWTH < 24 HOURS Performed at [facility], [place] [address] [place],  [address]    Report Status PENDING  Incomplete    RADIOLOGY STUDIES/RESULTS: Dg Foot Complete Left  Result Date: 03/25/2018 CLINICAL DATA:  Nonhealing ulcer on the distal aspect of the left foot EXAM: LEFT FOOT - COMPLETE 3+ VIEW COMPARISON:  None. FINDINGS: A soft tissue ulcer is noted along the plantar aspect of the foot consistent with the given clinical history. No bony erosion is identified to suggest osteomyelitis. No fracture or dislocation is seen. IMPRESSION: Soft tissue ulcer without changes of osteomyelitis. Electronically Signed   By: Inez Catalina M.D.   On: 03/25/2018 15:15     LOS: 1 day   Oren Binet, MD  [facility]  If 7PM-7AM, please contact night-coverage  Please page via [email or web address]-Password [email or web address]-click on MD name and type text message  03/26/2018, 11:14 AM

## 2018-03-26 NOTE — Progress Notes (Signed)
*  Preliminary Results* Left lower extremity venous duplex completed. Left lower extremity is negative for deep vein thrombosis. There is no evidence of left Baker's cyst.  03/26/2018 1:16 PM  Gertie Fey, MHA, RVT, RDCS, RDMS

## 2018-03-26 NOTE — Progress Notes (Addendum)
Inpatient Diabetes Program Recommendations  AACE/ADA: New Consensus Statement on Inpatient Glycemic Control (2015)  Target Ranges:  Prepandial:   less than 140 mg/dL      Peak postprandial:   less than 180 mg/dL (1-2 hours)      Critically ill patients:  140 - 180 mg/dL   Lab Results  Component Value Date   GLUCAP 162 (H) 03/26/2018   HGBA1C 11.6 (H) 07/15/2017    Review of Glycemic Control Results for ASHBY, MOSKAL (MRN [id number]) as of 03/26/2018 13:25  Ref. Range 03/25/2018 21:03 03/26/2018 08:08 03/26/2018 11:58  Glucose-Capillary Latest Ref Range: 70 - 99 mg/dL 409 (H) 811 (H) 914 (H)   Diabetes history: Type 2 DM  Outpatient Diabetes medications:  Lantus 70 units q HS, Novolog 22-32 units tid with meals Current orders for Inpatient glycemic control:  Novolog moderate tid with meals Lantus 60 units q HS, Novolog 15 units tid Inpatient Diabetes Program Recommendations:    Agree with current orders. No new orders.    Thanks,  Beryl Meager, RN, BC-ADM Inpatient Diabetes Coordinator Pager [phone] (8a-5p)

## 2018-03-26 NOTE — Telephone Encounter (Signed)
Dr. Jerral Ralph states pt has been admitted to [facility] through the ED and he needs to discuss with Dr. Ardelle Anton.

## 2018-03-26 NOTE — Progress Notes (Signed)
Dressing was changed on patients left foot.

## 2018-03-26 NOTE — Progress Notes (Signed)
   03/25/18 2024  Vitals  Temp 99.4 F (37.4 C)  Temp Source Oral  BP 137/81  MAP (mmHg) 98  BP Location Right Arm  BP Method Automatic  Patient Position (if appropriate) Sitting  Pulse Rate 81  Pulse Rate Source Dinamap  ECG Heart Rate 81  Cardiac Rhythm NSR  Resp 18  Oxygen Therapy  SpO2 100 %  Pain Assessment  Pain Scale 0-10  Pain Score 2   Assumed care of pt from ED. Pt Oriented to the unit and fall education completed .Pt Verbalized understanding . Skin assessment done with  diabetic ulcers noted  On  Left dorsal foot and Left heel

## 2018-03-27 ENCOUNTER — Inpatient Hospital Stay (HOSPITAL_COMMUNITY): Payer: BLUE CROSS/BLUE SHIELD

## 2018-03-27 ENCOUNTER — Inpatient Hospital Stay (HOSPITAL_COMMUNITY): Payer: BLUE CROSS/BLUE SHIELD | Admitting: Certified Registered"

## 2018-03-27 ENCOUNTER — Encounter (HOSPITAL_COMMUNITY)
Admission: EM | Disposition: A | Discharge status: Home / Self Care | Payer: Self-pay | Source: Home / Self Care | Attending: Internal Medicine

## 2018-03-27 ENCOUNTER — Encounter (HOSPITAL_COMMUNITY): Payer: Self-pay | Admitting: Certified Registered"

## 2018-03-27 DIAGNOSIS — E876 Hypokalemia: Secondary | ICD-10-CM

## 2018-03-27 DIAGNOSIS — L03116 Cellulitis of left lower limb: Secondary | ICD-10-CM

## 2018-03-27 DIAGNOSIS — R6510 Systemic inflammatory response syndrome (SIRS) of non-infectious origin without acute organ dysfunction: Secondary | ICD-10-CM

## 2018-03-27 DIAGNOSIS — E10622 Type 1 diabetes mellitus with other skin ulcer: Secondary | ICD-10-CM

## 2018-03-27 DIAGNOSIS — Z09 Encounter for follow-up examination after completed treatment for conditions other than malignant neoplasm: Secondary | ICD-10-CM

## 2018-03-27 DIAGNOSIS — M86679 Other chronic osteomyelitis, unspecified ankle and foot: Secondary | ICD-10-CM

## 2018-03-27 DIAGNOSIS — L98499 Non-pressure chronic ulcer of skin of other sites with unspecified severity: Secondary | ICD-10-CM

## 2018-03-27 HISTORY — PX: WOUND DEBRIDEMENT: SHX247

## 2018-03-27 LAB — BASIC METABOLIC PANEL
ANION GAP: 9 (ref 5–15)
BUN: 6 mg/dL (ref 6–20)
CHLORIDE: 103 mmol/L (ref 98–111)
CO2: 25 mmol/L (ref 22–32)
CREATININE: 0.87 mg/dL (ref 0.61–1.24)
Calcium: 9 mg/dL (ref 8.9–10.3)
GFR calc Af Amer: >60 mL/min (ref >60)
GFR calc non Af Amer: >60 mL/min (ref >60)
Glucose, Bld: 115 mg/dL — ABNORMAL HIGH (ref 70–99)
Potassium: 3.3 mmol/L — ABNORMAL LOW (ref 3.5–5.1)
SODIUM: 137 mmol/L (ref 135–145)

## 2018-03-27 LAB — GLUCOSE, CAPILLARY
GLUCOSE-CAPILLARY: 86 mg/dL (ref 70–99)
GLUCOSE-CAPILLARY: 207 mg/dL — AB (ref 70–99)
GLUCOSE-CAPILLARY: 124 mg/dL — AB (ref 70–99)
Glucose-Capillary: 130 mg/dL — ABNORMAL HIGH (ref 70–99)
Glucose-Capillary: 100 mg/dL — ABNORMAL HIGH (ref 70–99)
Glucose-Capillary: 101 mg/dL — ABNORMAL HIGH (ref 70–99)

## 2018-03-27 LAB — CBC
HCT: 36.6 % — ABNORMAL LOW (ref 39.0–52.0)
HEMOGLOBIN: 11.8 g/dL — AB (ref 13.0–17.0)
MCH: 27.9 pg (ref 26.0–34.0)
MCHC: 32.2 g/dL (ref 30.0–36.0)
MCV: 86.5 fL (ref 80.0–100.0)
NRBC: 0 % (ref 0.0–0.2)
PLATELETS: 257 10*3/uL (ref 150–400)
RBC: 4.23 MIL/uL (ref 4.22–5.81)
RDW: 13 % (ref 11.5–15.5)
WBC: 8.3 10*3/uL (ref 4.0–10.5)

## 2018-03-27 LAB — HEMOGLOBIN A1C
Hgb A1c MFr Bld: 10.7 % — ABNORMAL HIGH (ref 4.8–5.6)
Mean Plasma Glucose: 260.39 mg/dL

## 2018-03-27 LAB — TYPE AND SCREEN
ABO/RH(D): O POS
ANTIBODY SCREEN: NEGATIVE

## 2018-03-27 SURGERY — DEBRIDEMENT, WOUND
Anesthesia: Monitor Anesthesia Care | Site: Foot | Laterality: Left

## 2018-03-27 MED ORDER — POTASSIUM CHLORIDE CRYS ER 20 MEQ PO TBCR
40.0000 meq | EXTENDED_RELEASE_TABLET | Freq: Once | ORAL | Status: AC
  Administered 2018-03-27: 40 meq via ORAL
  Filled 2018-03-27: qty 2

## 2018-03-27 MED ORDER — LACTATED RINGERS IV SOLN: INTRAVENOUS | Status: DC

## 2018-03-27 MED ORDER — MIDAZOLAM HCL 2 MG/2ML IJ SOLN
INTRAMUSCULAR | Status: DC | PRN
  Administered 2018-03-27: 2 mg via INTRAVENOUS

## 2018-03-27 MED ORDER — MEPERIDINE HCL 50 MG/ML IJ SOLN: 6.2500 mg | INTRAMUSCULAR | Status: DC | PRN

## 2018-03-27 MED ORDER — BUPIVACAINE HCL (PF) 0.5 % IJ SOLN
INTRAMUSCULAR | Status: DC | PRN
  Administered 2018-03-27: 20 mL

## 2018-03-27 MED ORDER — FENTANYL CITRATE (PF) 250 MCG/5ML IJ SOLN
INTRAMUSCULAR | Status: DC | PRN
  Administered 2018-03-27: 50 ug via INTRAVENOUS

## 2018-03-27 MED ORDER — PROPOFOL 1000 MG/100ML IV EMUL
INTRAVENOUS | Status: AC
  Filled 2018-03-27: qty 100

## 2018-03-27 MED ORDER — POTASSIUM CHLORIDE CRYS ER 20 MEQ PO TBCR: 40.0000 meq | EXTENDED_RELEASE_TABLET | Freq: Once | ORAL | Status: DC

## 2018-03-27 MED ORDER — PROMETHAZINE HCL 25 MG/ML IJ SOLN: 6.2500 mg | INTRAMUSCULAR | Status: DC | PRN

## 2018-03-27 MED ORDER — ONDANSETRON HCL 4 MG/2ML IJ SOLN
INTRAMUSCULAR | Status: DC | PRN
  Administered 2018-03-27: 4 mg via INTRAVENOUS

## 2018-03-27 MED ORDER — MIDAZOLAM HCL 2 MG/2ML IJ SOLN
INTRAMUSCULAR | Status: AC
  Filled 2018-03-27: qty 2

## 2018-03-27 MED ORDER — PROPOFOL 10 MG/ML IV BOLUS
INTRAVENOUS | Status: AC
  Filled 2018-03-27: qty 20

## 2018-03-27 MED ORDER — FENTANYL CITRATE (PF) 100 MCG/2ML IJ SOLN: 25.0000 ug | INTRAMUSCULAR | Status: DC | PRN

## 2018-03-27 MED ORDER — PROPOFOL 10 MG/ML IV BOLUS
INTRAVENOUS | Status: DC | PRN
  Administered 2018-03-27: 30 mg via INTRAVENOUS

## 2018-03-27 MED ORDER — 0.9 % SODIUM CHLORIDE (POUR BTL) OPTIME
TOPICAL | Status: DC | PRN
  Administered 2018-03-27: 1000 mL

## 2018-03-27 MED ORDER — BUPIVACAINE HCL 0.5 % IJ SOLN
INTRAMUSCULAR | Status: AC
  Filled 2018-03-27: qty 1

## 2018-03-27 MED ORDER — PROPOFOL 500 MG/50ML IV EMUL
INTRAVENOUS | Status: DC | PRN
  Administered 2018-03-27: 200 ug/kg/min via INTRAVENOUS

## 2018-03-27 MED ORDER — LIDOCAINE 2% (20 MG/ML) 5 ML SYRINGE
INTRAMUSCULAR | Status: DC | PRN
  Administered 2018-03-27: 100 mg via INTRAVENOUS

## 2018-03-27 MED ORDER — LACTATED RINGERS IV SOLN
INTRAVENOUS | Status: DC | PRN
  Administered 2018-03-27: 07:00:00 via INTRAVENOUS

## 2018-03-27 MED ORDER — LIDOCAINE HCL 2 % IJ SOLN
INTRAMUSCULAR | Status: AC
  Filled 2018-03-27: qty 20

## 2018-03-27 MED ORDER — FENTANYL CITRATE (PF) 250 MCG/5ML IJ SOLN
INTRAMUSCULAR | Status: AC
  Filled 2018-03-27: qty 5

## 2018-03-27 MED ORDER — LIDOCAINE HCL 2 % IJ SOLN
INTRAMUSCULAR | Status: DC | PRN
  Administered 2018-03-27: 20 mL

## 2018-03-27 MED ORDER — ONDANSETRON HCL 4 MG/2ML IJ SOLN
INTRAMUSCULAR | Status: AC
  Filled 2018-03-27: qty 4

## 2018-03-27 SURGICAL SUPPLY — 46 items
BANDAGE ACE 4X5 VEL STRL LF (GAUZE/BANDAGES/DRESSINGS) ×1 IMPLANT
BANDAGE ESMARK 6X9 LF (GAUZE/BANDAGES/DRESSINGS) IMPLANT
BLADE SURG 10 STRL SS (BLADE) ×2 IMPLANT
BNDG CMPR 9X6 STRL LF SNTH (GAUZE/BANDAGES/DRESSINGS)
BNDG COHESIVE 4X5 TAN STRL (GAUZE/BANDAGES/DRESSINGS) ×1 IMPLANT
BNDG ESMARK 6X9 LF (GAUZE/BANDAGES/DRESSINGS)
BNDG GAUZE ELAST 4 BULKY (GAUZE/BANDAGES/DRESSINGS) ×1 IMPLANT
COVER WAND RF STERILE (DRAPES) ×2 IMPLANT
CUFF TOURNIQUET SINGLE 34IN LL (TOURNIQUET CUFF) IMPLANT
CUFF TOURNIQUET SINGLE 44IN (TOURNIQUET CUFF) IMPLANT
DRAPE U-SHAPE 47X51 STRL (DRAPES) ×4 IMPLANT
DRSG EMULSION OIL 3X3 NADH (GAUZE/BANDAGES/DRESSINGS) ×1 IMPLANT
DRSG PAD ABDOMINAL 8X10 ST (GAUZE/BANDAGES/DRESSINGS) ×1 IMPLANT
DURAPREP 26ML APPLICATOR (WOUND CARE) ×2 IMPLANT
ELECT REM PT RETURN 9FT ADLT (ELECTROSURGICAL) ×2
ELECTRODE REM PT RTRN 9FT ADLT (ELECTROSURGICAL) ×1 IMPLANT
GAUZE IODOFORM PACK 1/2 7832 (GAUZE/BANDAGES/DRESSINGS) ×1 IMPLANT
GAUZE SPONGE 4X4 12PLY STRL (GAUZE/BANDAGES/DRESSINGS) ×2 IMPLANT
GAUZE XEROFORM 5X9 LF (GAUZE/BANDAGES/DRESSINGS) ×1 IMPLANT
GLOVE BIO SURGEON STRL SZ8 (GLOVE) ×2 IMPLANT
GLOVE BIOGEL PI IND STRL 8 (GLOVE) ×1 IMPLANT
GLOVE BIOGEL PI INDICATOR 8 (GLOVE) ×1
GLOVE ORTHO TXT STRL SZ7.5 (GLOVE) ×2 IMPLANT
GOWN STRL REUS W/ TWL LRG LVL3 (GOWN DISPOSABLE) ×1 IMPLANT
GOWN STRL REUS W/ TWL XL LVL3 (GOWN DISPOSABLE) ×4 IMPLANT
GOWN STRL REUS W/TWL LRG LVL3 (GOWN DISPOSABLE) ×2
GOWN STRL REUS W/TWL XL LVL3 (GOWN DISPOSABLE) ×8
KIT BASIN OR (CUSTOM PROCEDURE TRAY) ×2 IMPLANT
KIT TURNOVER KIT B (KITS) ×2 IMPLANT
NDL ASPIRATION RAN815N 8X15 (NEEDLE) IMPLANT
NEEDLE ASPIRATION RAN815N 8X15 (NEEDLE) ×1 IMPLANT
NEEDLE ASPIRATION RANFAC 8X15 (NEEDLE) ×1
NS IRRIG 1000ML POUR BTL (IV SOLUTION) ×2 IMPLANT
PACK ORTHO EXTREMITY (CUSTOM PROCEDURE TRAY) ×2 IMPLANT
PAD ARMBOARD 7.5X6 YLW CONV (MISCELLANEOUS) ×4 IMPLANT
PAD CAST 4YDX4 CTTN HI CHSV (CAST SUPPLIES) ×1 IMPLANT
PADDING CAST COTTON 4X4 STRL (CAST SUPPLIES)
SPONGE LAP 18X18 X RAY DECT (DISPOSABLE) ×4 IMPLANT
STAPLER VISISTAT 35W (STAPLE) ×2 IMPLANT
STOCKINETTE IMPERVIOUS LG (DRAPES) IMPLANT
SUT ETHILON 2 0 PSLX (SUTURE) ×4 IMPLANT
TOWEL OR 17X24 6PK STRL BLUE (TOWEL DISPOSABLE) ×2 IMPLANT
TOWEL OR 17X26 10 PK STRL BLUE (TOWEL DISPOSABLE) ×2 IMPLANT
TUBE CONNECTING 12X1/4 (SUCTIONS) ×2 IMPLANT
WATER STERILE IRR 1000ML POUR (IV SOLUTION) ×2 IMPLANT
YANKAUER SUCT BULB TIP NO VENT (SUCTIONS) ×2 IMPLANT

## 2018-03-27 NOTE — Anesthesia Postprocedure Evaluation (Addendum)
Anesthesia Post Note  Patient: [name]  Procedure(s) Performed: DEBRIDEMENT WOUND (Left Foot)     Patient location during evaluation: PACU Anesthesia Type: MAC Level of consciousness: awake and alert Pain management: pain level controlled Vital Signs Assessment: post-procedure vital signs reviewed and stable Respiratory status: spontaneous breathing, nonlabored ventilation, respiratory function stable and patient connected to nasal cannula oxygen Cardiovascular status: stable and blood pressure returned to baseline Postop Assessment: no apparent nausea or vomiting Anesthetic complications: no    Last Vitals:  Vitals:   03/27/18 0853 03/27/18 0900  BP: 115/72 123/73  Pulse: 65 65  Resp: 17 16  Temp:  (!) 36.1 C  SpO2: 92% 93%    Last Pain:  Vitals:   03/27/18 0900  TempSrc:   PainSc: Elbert Bibi Economos

## 2018-03-27 NOTE — Op Note (Signed)
PATIENT:  [name]  52 y.o. male  PRE-OPERATIVE DIAGNOSIS:  Left foot wound, osteomyelitis  POST-OPERATIVE DIAGNOSIS:  * No post-op diagnosis entered *  PROCEDURE:  Procedure(s): DEBRIDEMENT WOUND (Left)  SURGEON:  Surgeon(s) and Role:    Trula Slade, DPM - Primary  PHYSICIAN ASSISTANT:   ASSISTANTS: none   ANESTHESIA:   MAC  EBL:  10 mL   BLOOD ADMINISTERED:none  DRAINS: none   LOCAL MEDICATIONS USED:  OTHER 20 cc 2% lidocaine and 0.5% Marcaine plain  SPECIMEN:  Source of Specimen:  left foot wound culture, bone biopsy for pathology and microbiology  DISPOSITION OF SPECIMEN:  PATHOLOGY  COUNTS:  YES  TOURNIQUET:  * Missing tourniquet times found for documented tourniquets in log: [id number] *  DICTATION: .Dragon Dictation  PLAN OF CARE: inpatient   PATIENT DISPOSITION:  PACU - hemodynamically stable.   Delay start of Pharmacological VTE agent (>24hrs) due to surgical blood loss or risk of bleeding: no  Indications for surgery:  52 year old male presented to the emergency room for concerns of worsening cellulitis and ulceration to his left foot.  He is admitted to the hospital on current antibiotics.  Despite IV antibiotics he continues to have minimal resolution of the cellulitis.  Due to MRI findings and clinical symptoms discussed surgery clean wound debridement, bone biopsy, incision and drainage.  Discussed the procedure as well as postoperative course.  Alternatives, risks, complications were discussed. No promises or guarantees given as to the outcome of the surgery  Procedure in detail: The patient was both verbally and visually identified by myself, nursing staff, the anesthesia staff in the preoperative holding area.  He was then transferred to the operating room via stretcher and placed on the operative table in supine position.  After an adequate plane of anesthesia was obtained and a timeout was performed a mixture of 10 cc of 2% lidocaine  plain and 0.5% Marcaine plain was infiltrated in a regional block fashion.  The left lower extremities and scrubbed, prepped, draped in normal sterile fashion.  Attention was directed to the plantar aspect the left foot submetatarsal form which there was ulceration with purulent drainage identified.  Utilizing a 15 blade scalpel to circumferentially excise the wound down to healthy, bleeding tissue.  A rongeur and scalpel was utilized to debride all nonviable infected tissue.  A hemostat was then utilized to decompress the area and there is found to be an abscess and purulent drainage coming from the fourth interspace and probes to the dorsal skin. This area was cultured.  After further debridement of the wound he was found to be tunneling in all directions to approximately half centimeter.  It did probe into the third and fourth interspace. The wound was copiously irrigated. I utilized Jamshidi needle in order to take a biopsy of the fourth metatarsal which was visualized.  The bone did appear to be soft.  I split this was sent this for culture as well as pathology.  I further inspect of the wound and there is no further purulence identified.  Wound was again irrigated.  Hemostasis achieved.  The wound was packed with half-inch iodoform packing. At the end of the procedure the wound measures about 2.5 x 2.5 cm.   He also has a superficial wound of the posterior aspect of the heel.  There is no signs of infection of this area.  Adaptic was applied to the wound as well as the posterior heel followed by dry sterile dressing.  Transferred to PACU vital signs stable and vascular status intact.  Will remain inpatient on broad-spectrum antibiotics and will await culture.  As the bone was soft I do think there is likely osteomyelitis will likely need prolonged antibiotics or further surgery.  We will await the culture results and pathology results.   Celesta Gentile, DPM

## 2018-03-27 NOTE — Progress Notes (Signed)
RN verified the presence of a signed informed consent that matches stated procedure by patient. Verified armband matches patient's stated name and birth date. Verified NPO status and that all jewelry, contact, glasses, dentures, and partials had been removed (if applicable).  

## 2018-03-27 NOTE — Progress Notes (Signed)
Subjective: 52 year old male was admitted to the hospital due to worsening cellulitis and ulceration of his left foot.  He had a MRI performed yesterday which revealed possible septic arthritis and possible reactive changes of the fourth metatarsal.  He has been on IV antibiotics without any significant resolution and he has been on this since Thursday.  Due to this we discussed surgical intervention including wound debridement, bone biopsy, incision and drainage.  I am seems in the preoperative holding area and he is scheduled for surgery this morning he is ready to proceed without any questions. Denies any systemic complaints such as fevers, chills, nausea, vomiting. No acute changes since last appointment, and no other complaints at this time.   Objective: AAO x3, NAD DP/PT pulses palpable bilaterally, CRT less than 3 seconds Ulceration continues on the plantar aspect the left foot submetatarsal 4.  Some mild purulent drainage is expressed.  There is still significant erythema to the dorsal aspect of the foot although it does appear to be somewhat improved compared to last night.  There is no fluctuation or crepitation.  There is no palpable area of an abscess.  There is increase in warmth of the foot and ankle and there is mild edema of the ankle as well as still swelling to the foot. Superficial wound posterior heel No open lesions or pre-ulcerative lesions.  No pain with calf compression, swelling, warmth, erythema  Wound culture 10/25: Specimen Description FOOT LEFT   Special Requests NONE   Gram Stain FEW WBC PRESENT, PREDOMINANTLY PMN  FEW GRAM POSITIVE COCCI  FEW GRAM POSITIVE RODS  RARE GRAM NEGATIVE RODS  Performed at [facility], [address], [place], [place] [address]   Culture PENDING   Report Status PENDING    MRI: 03/26/2018 IMPRESSION: 1. Soft tissue ulcer overlying the fourth MTP joint. Mild marrow edema in the fourth metatarsal head and base of the fourth  proximal phalanx with a small joint effusion which may reflect reactive changes versus septic arthritis. No drainable fluid collection to suggest an abscess. 2. Cellulitis along the dorsal aspect of the foot. Assessment: Cellulitis left foot, ulceration  Plan: -All treatment options discussed with the patient including all alternatives, risks, complications.  -Again we discussed surgery as well as continuing antibodies.  At this point he has not had significant improvement despite IV antibiotics.  Because of this we discussed wound debridement, incision and drainage and bone biopsy.  I discussed alternatives, risks and complications.  No promises or guarantees were given any wishes to proceed and has no further questions or concerns. -NPO  Geronimo Running: [phone] C: [id number]

## 2018-03-27 NOTE — Anesthesia Procedure Notes (Signed)
Procedure Name: MAC Date/Time: 03/27/2018 7:49 AM Performed by: Imagene Riches, CRNA Pre-anesthesia Checklist: Emergency Drugs available, Patient identified, Suction available and Patient being monitored Patient Re-evaluated:Patient Re-evaluated prior to induction Oxygen Delivery Method: Simple face mask Ventilation: Oral airway inserted - appropriate to patient size

## 2018-03-27 NOTE — Progress Notes (Signed)
PROGRESS NOTE        PATIENT DETAILS Name: [name] Age: 52 y.o. Sex: male Date of Birth: 08-Apr-1966 Admit Date: 03/25/2018 Admitting Physician Alessandra Bevels, MD ZOX:[id number], Alehegn, MD  Brief Narrative: Patient is a 52 y.o. 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  Thought to have a soft tissue infection and admitted to the hospitalist service.  Subjective:  Patient in bed, appears comfortable, denies any headache, no fever, no chest pain or pressure, no shortness of breath , no abdominal pain. No focal weakness.   Assessment/Plan:  Sepsis secondary to left foot soft tissue infection with suspicion for osteomyelitis on MRI: Sepsis pathophysiology has improved, currently on IV vancomycin which will be continued, had a ulcer with pus coming out of it under the left foot plantar surface, seen by podiatrist Dr. Ardelle Anton who did a incision and drainage procedure on 03/27/2018, follow intraoperative culture results, will discuss with podiatry on weightbearing etc.  Currently continue supportive care.  DM-1: CBG's on the higher side-increase Lantus to 60 units, continue SSI.  Follow.  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  Dyslipidemia: Continue fenofibrate.    DVT Prophylaxis: Prophylactic Lovenox   Code Status: Full code   Family Communication: None at bedside  Disposition Plan: Remain inpatient-home when work-up complete-probably in the next few days.  Antimicrobial agents: Anti-infectives (From admission, onward)   Start     Dose/Rate Route Frequency Ordered Stop   03/26/18 0600  vancomycin (VANCOCIN) IVPB 1000  mg/200 mL premix     1,000 mg 200 mL/hr over 60 Minutes Intravenous Every 12 hours 03/25/18 1708     03/25/18 1715  vancomycin (VANCOCIN) 2,000 mg in sodium chloride 0.9 % 500 mL IVPB     2,000 mg 250 mL/hr over 120 Minutes Intravenous STAT 03/25/18 1702 03/25/18 1956   03/25/18 1700  vancomycin (VANCOCIN) IVPB 1000 mg/200 mL premix  Status:  Discontinued     1,000 mg 200 mL/hr over 60 Minutes Intravenous  Once 03/25/18 1655 03/25/18 1702   03/25/18 1700  cefTRIAXone (ROCEPHIN) 2 g in sodium chloride 0.9 % 100 mL IVPB  Status:  Discontinued     2 g 200 mL/hr over 30 Minutes Intravenous Every 24 hours 03/25/18 1655 03/26/18 0952      Procedures:  Left foot plantar aspect abscess with possible osteomyelitis.  Incision and drainage and debridement of the wound done by podiatrist Dr. Ardelle Anton on 03/28/2015.  CONSULTS:  None  Time spent: 25 minutes-Greater than 50% of this time was spent in counseling, explanation of diagnosis, planning of further management, and coordination of care.  MEDICATIONS: Scheduled Meds: . Amphetamine Sulfate  20 mg Oral BID  . aspirin  81 mg Oral Daily  . DULoxetine  60 mg Oral Daily  . enoxaparin (LOVENOX) injection  40 mg Subcutaneous Q24H  . fenofibrate  160 mg Oral Daily  . gabapentin  800 mg Oral TID  . insulin aspart  0-15 Units Subcutaneous TID WC  . insulin aspart  15 Units Subcutaneous TID WC  . insulin  glargine  60 Units Subcutaneous QHS  . pantoprazole  40 mg Oral QAC breakfast  . potassium chloride  40 mEq Oral Once   Continuous Infusions: . sodium chloride 10 mL/hr at 03/27/18 0517  . vancomycin 1,000 mg (03/27/18 0518)   PRN Meds:.acetaminophen, bisacodyl, calcium carbonate, HYDROcodone-acetaminophen, zolpidem   PHYSICAL EXAM: Vital signs: Vitals:   03/27/18 0845 03/27/18 0850 03/27/18 0853 03/27/18 0900  BP: 109/76  115/72 123/73  Pulse: 68  65 65  Resp: 13  17 16   Temp: (!) 97 F (36.1 C)   (!) 97 F (36.1 C)  TempSrc:        SpO2: 98% 96% 92% 93%  Weight:      Height:       Filed Weights   03/25/18 1441  Weight: 111.1 kg   Body mass index is 29.82 kg/m.   Exam  Awake Alert, Oriented X 3, No new F.N deficits, Normal affect [place].AT,PERRAL Supple Neck,No JVD, No cervical lymphadenopathy appriciated.  Symmetrical Chest wall movement, Good air movement bilaterally, CTAB RRR,No Gallops, Rubs or new Murmurs, No Parasternal Heave +ve B.Sounds, Abd Soft, No tenderness, No organomegaly appriciated, No rebound - guarding or rigidity. No Cyanosis, Clubbing or edema, left foot under bandage   I have personally reviewed following labs and imaging studies  LABORATORY DATA: CBC: Recent Labs  Lab 03/25/18 1456 03/26/18 0445 03/27/18 0418  WBC 10.8* 10.2 8.3  NEUTROABS 8.4*  --   --   HGB 13.4 12.6* 11.8*  HCT 43.1 39.0 36.6*  MCV 89.6 88.8 86.5  PLT 276 242 257    Basic Metabolic Panel: Recent Labs  Lab 03/25/18 1456 03/26/18 0445 03/27/18 0418  NA 130* 137 137  K 4.0 3.6 3.3*  CL 96* 103 103  CO2 24 26 25   GLUCOSE 406* 188* 115*  BUN 12 8 6   CREATININE 1.19 0.87 0.87  CALCIUM 9.4 8.5* 9.0    GFR: Estimated Creatinine Clearance: 132.5 mL/min (by C-G formula based on SCr of 0.87 mg/dL).  Liver Function Tests: Recent Labs  Lab 03/25/18 1456  AST 16  ALT 17  ALKPHOS 90  BILITOT 1.0  PROT 7.3  ALBUMIN 3.6   No results for input(s): LIPASE, AMYLASE in the last 168 hours. No results for input(s): AMMONIA in the last 168 hours.  Coagulation Profile: Recent Labs  Lab 03/25/18 1456  INR 1.01    Cardiac Enzymes: No results for input(s): CKTOTAL, CKMB, CKMBINDEX, TROPONINI in the last 168 hours.  BNP (last 3 results) No results for input(s): PROBNP in the last 8760 hours.  HbA1C: No results for input(s): HGBA1C in the last 72 hours.  CBG: Recent Labs  Lab 03/26/18 1646 03/26/18 2129 03/27/18 0714 03/27/18 0845 03/27/18 0926  GLUCAP 149* 120* 130* 100* 101*    Lipid  Profile: No results for input(s): CHOL, HDL, LDLCALC, TRIG, CHOLHDL, LDLDIRECT in the last 72 hours.  Thyroid Function Tests: No results for input(s): TSH, T4TOTAL, FREET4, T3FREE, THYROIDAB in the last 72 hours.  Anemia Panel: No results for input(s): VITAMINB12, FOLATE, FERRITIN, TIBC, IRON, RETICCTPCT in the last 72 hours.  Urine analysis:    Component Value Date/Time   COLORURINE YELLOW 03/25/2018 1736   APPEARANCEUR CLEAR 03/25/2018 1736   LABSPEC 1.027 03/25/2018 1736   PHURINE 5.0 03/25/2018 1736   GLUCOSEU >=500 (A) 03/25/2018 1736   HGBUR NEGATIVE 03/25/2018 1736   BILIRUBINUR NEGATIVE 03/25/2018 1736   KETONESUR NEGATIVE 03/25/2018 1736   PROTEINUR NEGATIVE 03/25/2018 1736  NITRITE NEGATIVE 03/25/2018 1736   LEUKOCYTESUR NEGATIVE 03/25/2018 1736    Sepsis Labs: Lactic Acid, Venous    Component Value Date/Time   LATICACIDVEN 1.1 03/26/2018 0981    MICROBIOLOGY: Recent Results (from the past 240 hour(s))  Culture, blood (Routine x 2)     Status: None (Preliminary result)   Collection Time: 03/25/18  2:50 PM  Result Value Ref Range Status   Specimen Description SITE NOT SPECIFIED  Final   Special Requests   Final    BOTTLES DRAWN AEROBIC ONLY Blood Culture adequate volume   Culture   Final    NO GROWTH 2 DAYS Performed at [facility], [address], [place], [place] [address]    Report Status PENDING  Incomplete  Culture, blood (Routine x 2)     Status: None (Preliminary result)   Collection Time: 03/25/18  4:24 PM  Result Value Ref Range Status   Specimen Description BLOOD RIGHT FOREARM  Final   Special Requests   Final    BOTTLES DRAWN AEROBIC AND ANAEROBIC Blood Culture adequate volume   Culture   Final    NO GROWTH 2 DAYS Performed at [facility], [address], [place], [place] [address]    Report Status PENDING  Incomplete  Aerobic Culture (superficial specimen) w precautions panel     Status: None (Preliminary result)    Collection Time: 03/26/18  5:32 PM  Result Value Ref Range Status   Specimen Description FOOT LEFT  Final   Special Requests NONE  Final   Gram Stain   Final    FEW WBC PRESENT, PREDOMINANTLY PMN FEW GRAM POSITIVE COCCI FEW GRAM POSITIVE RODS RARE GRAM NEGATIVE RODS Performed at [facility], [address], [place], [place] [address]    Culture PENDING  Incomplete   Report Status PENDING  Incomplete    RADIOLOGY STUDIES/RESULTS: Mr Foot Left W Wo Contrast  Result Date: 03/26/2018 CLINICAL DATA:  Infection of the left foot with bowel smelling ulcer along the plantar aspect. EXAM: MRI OF THE LEFT FOREFOOT WITHOUT AND WITH CONTRAST TECHNIQUE: Multiplanar, multisequence MR imaging of the left forefoot was performed both before and after administration of intravenous contrast. CONTRAST:  10 mL Gadavist COMPARISON:  None. FINDINGS: Bones/Joint/Cartilage Soft tissue ulcer overlying the fourth MTP joint. Mild marrow edema in the fourth metatarsal head and base of the fourth proximal phalanx with a small joint effusion which may reflect reactive changes versus septic arthritis. No bone destruction. No fracture or dislocation. Normal alignment. No other joint effusion. Ligaments Collateral ligaments are intact.  Lisfranc ligament is intact. Muscles and Tendons Flexor, peroneal and extensor compartment tendons are intact. T2 hyperintense signal throughout the plantar musculature likely neurogenic. Soft tissue No fluid collection or hematoma. No soft tissue mass. Severe soft tissue edema in the dorsal aspect of the foot with enhancement most concerning for cellulitis. IMPRESSION: 1. Soft tissue ulcer overlying the fourth MTP joint. Mild marrow edema in the fourth metatarsal head and base of the fourth proximal phalanx with a small joint effusion which may reflect reactive changes versus septic arthritis. No drainable fluid collection to suggest an abscess. 2. Cellulitis along the dorsal aspect of the  foot. Electronically Signed   By: Elige Ko   On: 03/26/2018 14:57   Dg Foot 2 Views Left  Result Date: 03/27/2018 CLINICAL DATA:  52 year old male postoperative day zero debridement of left foot wound with abscess, 4th metatarsal bone biopsy, possible osteomyelitis. EXAM: LEFT  FOOT - 2 VIEW COMPARISON:  Left foot radiographs 03/25/2018. FINDINGS: AP and lateral portable views. Plantar MTP joint region wound with soft tissue swelling and mild soft tissue gas. Gas in the region of the 4th MTP on the AP view. No discrete bone biopsy site or fracture identified. Alignment preserved. Soft tissue swelling has increased since 03/25/2018. IMPRESSION: Soft tissue swelling with infectious and/or postoperative gas about the plantar 4th MTP. No acute osseous abnormality identified radiographically. Electronically Signed   By: Odessa Fleming M.D.   On: 03/27/2018 10:27   Dg Foot Complete Left  Result Date: 03/25/2018 CLINICAL DATA:  Nonhealing ulcer on the distal aspect of the left foot EXAM: LEFT FOOT - COMPLETE 3+ VIEW COMPARISON:  None. FINDINGS: A soft tissue ulcer is noted along the plantar aspect of the foot consistent with the given clinical history. No bony erosion is identified to suggest osteomyelitis. No fracture or dislocation is seen. IMPRESSION: Soft tissue ulcer without changes of osteomyelitis. Electronically Signed   By: Alcide Clever M.D.   On: 03/25/2018 15:15     LOS: 2 days   Susa Raring, MD  [facility]  If 7PM-7AM, please contact night-coverage  Please page via [email or web address]-click on MD name and type text message  03/27/2018, 10:38 AM

## 2018-03-27 NOTE — Brief Op Note (Signed)
03/25/2018 - 03/27/2018  8:30 AM  PATIENT:  Carolynn Sayers  52 y.o. male  PRE-OPERATIVE DIAGNOSIS:  Left foot wound, osteomyelitis  POST-OPERATIVE DIAGNOSIS:  * No post-op diagnosis entered *  PROCEDURE:  Procedure(s): DEBRIDEMENT WOUND (Left)  SURGEON:  Surgeon(s) and Role:    Trula Slade, DPM - Primary  PHYSICIAN ASSISTANT:   ASSISTANTS: none   ANESTHESIA:   MAC  EBL:  10 mL   BLOOD ADMINISTERED:none  DRAINS: none   LOCAL MEDICATIONS USED:  OTHER 20 cc 2% lidocaine and 0.5% Marcaine plain  SPECIMEN:  Source of Specimen:  left foot wound culture, bone biopsy for pathology and microbiology  DISPOSITION OF SPECIMEN:  PATHOLOGY  COUNTS:  YES  TOURNIQUET:  * Missing tourniquet times found for documented tourniquets in log: [id number] *  DICTATION: .Dragon Dictation  PLAN OF CARE: inpatient   PATIENT DISPOSITION:  PACU - hemodynamically stable.   Delay start of Pharmacological VTE agent (>24hrs) due to surgical blood loss or risk of bleeding: no   Intraoperative findings: Abscess noted in the 4th interspace with purulent drainage. Purulence from the 4th MTPJ. Debrided wound to healthy tissue. Bone biopsy done and the bone appears soft. Plan to await cultures. Likely will need prolonged antibiotics due to likely osteomyelitis.   Celesta Gentile, DPM

## 2018-03-27 NOTE — Anesthesia Preprocedure Evaluation (Addendum)
Anesthesia Evaluation  Patient identified by MRN, date of birth, ID band Patient awake    Reviewed: Allergy & Precautions, NPO status , Patient's Chart, lab work & pertinent test results  Airway Mallampati: II  TM Distance: >3 FB Neck ROM: Full    Dental  (+) Teeth Intact, Dental Advisory Given   Pulmonary former smoker,    breath sounds clear to auscultation       Cardiovascular negative cardio ROS   Rhythm:Regular Rate:Normal     Neuro/Psych Anxiety Depression    GI/Hepatic Neg liver ROS, GERD  Medicated,  Endo/Other  diabetes, Type 2, Insulin Dependent  Renal/GU      Musculoskeletal  (+) Arthritis , Osteoarthritis,    Abdominal Normal abdominal exam  (+)   Peds  Hematology negative hematology ROS (+)   Anesthesia Other Findings   Reproductive/Obstetrics                            Lab Results  Component Value Date   WBC 8.3 03/27/2018   HGB 11.8 (L) 03/27/2018   HCT 36.6 (L) 03/27/2018   MCV 86.5 03/27/2018   PLT 257 03/27/2018   Lab Results  Component Value Date   CREATININE 0.87 03/27/2018   BUN 6 03/27/2018   NA 137 03/27/2018   K 3.3 (L) 03/27/2018   CL 103 03/27/2018   CO2 25 03/27/2018   Lab Results  Component Value Date   INR 1.01 03/25/2018   INR 0.94 06/10/2011   Echo: - Left ventricle: The cavity size was normal. Wall thickness was   normal. Systolic function was normal. The estimated ejection   fraction was in the range of 60% to 65%. Wall motion was normal;   there were no regional wall motion abnormalities. Left   ventricular diastolic function parameters were normal for the   patient&'s age.  Anesthesia Physical Anesthesia Plan  ASA: II  Anesthesia Plan: MAC   Post-op Pain Management:    Induction: Intravenous  PONV Risk Score and Plan: 3 and Ondansetron, Midazolam and Treatment may vary due to age or medical condition  Airway Management  Planned: Simple Face Mask and Natural Airway  Additional Equipment: None  Intra-op Plan:   Post-operative Plan:   Informed Consent: I have reviewed the patients History and Physical, chart, labs and discussed the procedure including the risks, benefits and alternatives for the proposed anesthesia with the patient or authorized representative who has indicated his/her understanding and acceptance.   Dental advisory given  Plan Discussed with: CRNA  Anesthesia Plan Comments:        Anesthesia Quick Evaluation

## 2018-03-27 NOTE — Transfer of Care (Signed)
Immediate Anesthesia Transfer of Care Note  Patient: [name]  Procedure(s) Performed: DEBRIDEMENT WOUND (Left Foot)  Patient Location: PACU  Anesthesia Type:MAC  Level of Consciousness: awake, alert  and oriented  Airway & Oxygen Therapy: Patient Spontanous Breathing  Post-op Assessment: Report given to RN and Post -op Vital signs reviewed and stable  Post vital signs: Reviewed and stable  Last Vitals:  Vitals Value Taken Time  BP    Temp    Pulse 67 03/27/2018  8:41 AM  Resp    SpO2 92 % 03/27/2018  8:41 AM  Vitals shown include unvalidated device data.  Last Pain:  Vitals:   03/27/18 0511  TempSrc: Oral  PainSc:          Complications: No apparent anesthesia complications

## 2018-03-28 ENCOUNTER — Inpatient Hospital Stay: Payer: Self-pay

## 2018-03-28 ENCOUNTER — Encounter (HOSPITAL_COMMUNITY): Payer: Self-pay | Admitting: Podiatry

## 2018-03-28 LAB — BASIC METABOLIC PANEL
Anion gap: 10 (ref 5–15)
BUN: 8 mg/dL (ref 6–20)
CHLORIDE: 100 mmol/L (ref 98–111)
CO2: 28 mmol/L (ref 22–32)
CREATININE: 1.06 mg/dL (ref 0.61–1.24)
Calcium: 9.4 mg/dL (ref 8.9–10.3)
GFR calc Af Amer: >60 mL/min (ref >60)
GFR calc non Af Amer: >60 mL/min (ref >60)
GLUCOSE: 104 mg/dL — AB (ref 70–99)
POTASSIUM: 3.9 mmol/L (ref 3.5–5.1)
SODIUM: 138 mmol/L (ref 135–145)

## 2018-03-28 LAB — CBC
HEMATOCRIT: 37.5 % — AB (ref 39.0–52.0)
HEMOGLOBIN: 11.9 g/dL — AB (ref 13.0–17.0)
MCH: 28 pg (ref 26.0–34.0)
MCHC: 31.7 g/dL (ref 30.0–36.0)
MCV: 88.2 fL (ref 80.0–100.0)
Platelets: 282 10*3/uL (ref 150–400)
RBC: 4.25 MIL/uL (ref 4.22–5.81)
RDW: 13.2 % (ref 11.5–15.5)
WBC: 8.1 10*3/uL (ref 4.0–10.5)
nRBC: 0 % (ref 0.0–0.2)

## 2018-03-28 LAB — GLUCOSE, CAPILLARY
GLUCOSE-CAPILLARY: 131 mg/dL — AB (ref 70–99)
GLUCOSE-CAPILLARY: 135 mg/dL — AB (ref 70–99)
Glucose-Capillary: 157 mg/dL — ABNORMAL HIGH (ref 70–99)
Glucose-Capillary: 187 mg/dL — ABNORMAL HIGH (ref 70–99)

## 2018-03-28 LAB — VANCOMYCIN, TROUGH: Vancomycin Tr: 7 ug/mL — ABNORMAL LOW (ref 15–20)

## 2018-03-28 MED ORDER — SODIUM CHLORIDE 0.9% FLUSH: 10.0000 mL | INTRAVENOUS | Status: DC | PRN

## 2018-03-28 MED ORDER — VANCOMYCIN HCL 10 G IV SOLR
1500.0000 mg | Freq: Two times a day (BID) | INTRAVENOUS | Status: DC
  Administered 2018-03-28 – 2018-03-30 (×4): 1500 mg via INTRAVENOUS
  Filled 2018-03-28 (×5): qty 1500

## 2018-03-28 MED ORDER — SODIUM CHLORIDE 0.9% FLUSH: 10.0000 mL | Freq: Two times a day (BID) | INTRAVENOUS | Status: DC

## 2018-03-28 NOTE — Evaluation (Signed)
Physical Therapy Evaluation Patient Details Name: [name] MRN: [id number] DOB: Sep 30, 1965 Today's Date: 03/28/2018   History of Present Illness    [name] is a 52 y.o. male admitted on 03/25/2018 with L foot cellulitis. Noted purulent drainage.  Underwent I&D 10/26, NWB LLE   Clinical Impression  Pt presents with mild limitations to functional mobility due to NWB status LLE.  Provided training with a variety of devices, recommend crutches AND RW for use at work/home (note pt is 6'4"). PT able to mobilize safely with either device.  Consulted with Dr. Ardelle Anton via telephone re: orthosis to off-load forefoot.  Pt describes previous orthosis prescribed by MD as a CAM walker boot.  MD may bring heel wedge shoe next date to try.  Unable to contact ortho tech for in house orthoses, will follow up with RN.  No further PT needs at this time, please reconsult if status changes.     Follow Up Recommendations No PT follow up    Equipment Recommendations  Rolling walker with 5" wheels;Crutches(TALL pt is 6'4" -- pt need both ADs for work/home use. )    Recommendations for Other Services       Precautions / Restrictions Restrictions Weight Bearing Restrictions: Yes LLE Weight Bearing: Non weight bearing      Mobility  Bed Mobility Overal bed mobility: Independent                Transfers Overall transfer level: Modified independent Equipment used: Rolling walker (2 wheeled);Crutches             General transfer comment: able to stand/sit without assist, somewhat impulsive initially with uncontrolled landing and cues for safety, but able to demonstrate safe transition with devices  Ambulation/Gait Ambulation/Gait assistance: Supervision Gait Distance (Feet): 25 Feet(X2) Assistive device: Rolling walker (2 wheeled);Crutches Gait Pattern/deviations: (NWB LLE)     General Gait Details: mild dyscoordination with crutches initially but recovers and able to  maneuver.  Feels more steady with RW though more likely to TTWB with this device  Stairs            Wheelchair Mobility    Modified Rankin (Stroke Patients Only)       Balance Overall balance assessment: Modified Independent                                           Pertinent Vitals/Pain Pain Assessment: No/denies pain(peripheral neuropathy...some throbbing, less now)    Home Living Family/patient expects to be discharged to:: Private residence Living Arrangements: Alone   Type of Home: House Home Access: Stairs to enter Entrance Stairs-Rails: None Entrance Stairs-Number of Steps: 2 Home Layout: One level Home Equipment: None Additional Comments: has what sounds like cam walker boot    Prior Function Level of Independence: Independent               Hand Dominance   Dominant Hand: Right    Extremity/Trunk Assessment   Upper Extremity Assessment Upper Extremity Assessment: Overall WFL for tasks assessed    Lower Extremity Assessment Lower Extremity Assessment: LLE deficits/detail;Overall WFL for tasks assessed LLE Deficits / Details: NWB, ace wrap to forefoot       Communication   Communication: No difficulties  Cognition Arousal/Alertness: Awake/alert Behavior During Therapy: WFL for tasks assessed/performed Overall Cognitive Status: Within Functional Limits for tasks assessed  General Comments: pt clearly frustrated with NWB status and at injury      General Comments General comments (skin integrity, edema, etc.): long discussion about different devices and shoes to off load    Exercises     Assessment/Plan    PT Assessment Patent does not need any further PT services  PT Problem List         PT Treatment Interventions      PT Goals (Current goals can be found in the Care Plan section)  Acute Rehab PT Goals Patient Stated Goal: heal foot PT Goal Formulation: All  assessment and education complete, DC therapy    Frequency     Barriers to discharge        Co-evaluation               AM-PAC PT "6 Clicks" Daily Activity  Outcome Measure Difficulty turning over in bed (including adjusting bedclothes, sheets and blankets)?: None Difficulty moving from lying on back to sitting on the side of the bed? : None Difficulty sitting down on and standing up from a chair with arms (e.g., wheelchair, bedside commode, etc,.)?: A Little Help needed moving to and from a bed to chair (including a wheelchair)?: None Help needed walking in hospital room?: None Help needed climbing 3-5 steps with a railing? : A Little 6 Click Score: 22    End of Session   Activity Tolerance: Patient tolerated treatment well Patient left: in bed;with call bell/phone within reach Nurse Communication: Mobility status;Weight bearing status PT Visit Diagnosis: Unsteadiness on feet (R26.81)    Time: 9381-0175 PT Time Calculation (min) (ACUTE ONLY): 29 min   Charges:   PT Evaluation $PT Eval Low Complexity: 1 Low PT Treatments $Gait Training: 8-22 mins        Narda Amber, PT, DPT, MS Board Certified Geriatric Clinical Specialist  Dennis Bast 03/28/2018, 2:02 PM

## 2018-03-28 NOTE — Progress Notes (Signed)
PROGRESS NOTE        PATIENT DETAILS Name: [name] Age: 52 y.o. Sex: male Date of Birth: 03/29/1966 Admit Date: 03/25/2018 Admitting Physician Alessandra Bevels, MD WUJ:[id number], Alehegn, MD  Brief Narrative: Patient is a 52 y.o. 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  Thought to have a soft tissue infection and admitted to the hospitalist service.  Subjective:  Patient in bed, appears comfortable, denies any headache, no fever, no chest pain or pressure, no shortness of breath , no abdominal pain. No focal weakness.  Assessment/Plan:  Sepsis secondary to left foot soft tissue infection - with clinical and intraoperative suspicion for osteomyelitis, [place] on IV vancomycin, underwent debridement along with incision and drainage of his left foot abscess and wound by Dr. Ardelle Anton podiatrist on 03/27/2018, follow intraoperative cultures, will require 6 weeks of IV antibiotic per podiatry.  Will proceed with PICC line as blood cultures have been negative, continue supportive care.  Likely discharge in the next 1 to 2 days. Case discussed with podiatry in detail on 03/27/2018.  DM-1: CBG's on the higher side-increase Lantus to 60 units, continue SSI.  Follow.  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  Dyslipidemia: Continue fenofibrate.    DVT Prophylaxis: Prophylactic Lovenox   Code Status: Full code   Family Communication: None at bedside  Disposition Plan: Remain inpatient-home when work-up complete-probably in the next few days.  Antimicrobial agents: Anti-infectives (From admission, onward)   Start     Dose/Rate Route  Frequency Ordered Stop   03/26/18 0600  vancomycin (VANCOCIN) IVPB 1000 mg/200 mL premix     1,000 mg 200 mL/hr over 60 Minutes Intravenous Every 12 hours 03/25/18 1708     03/25/18 1715  vancomycin (VANCOCIN) 2,000 mg in sodium chloride 0.9 % 500 mL IVPB     2,000 mg 250 mL/hr over 120 Minutes Intravenous STAT 03/25/18 1702 03/25/18 1956   03/25/18 1700  vancomycin (VANCOCIN) IVPB 1000 mg/200 mL premix  Status:  Discontinued     1,000 mg 200 mL/hr over 60 Minutes Intravenous  Once 03/25/18 1655 03/25/18 1702   03/25/18 1700  cefTRIAXone (ROCEPHIN) 2 g in sodium chloride 0.9 % 100 mL IVPB  Status:  Discontinued     2 g 200 mL/hr over 30 Minutes Intravenous Every 24 hours 03/25/18 1655 03/26/18 0952      Procedures:  Left foot plantar aspect abscess with possible osteomyelitis.  Incision and drainage and debridement of the wound done by podiatrist Dr. Ardelle Anton on 03/28/2015.  CONSULTS:  None  Time spent: 25 minutes-Greater than 50% of this time was spent in counseling, explanation of diagnosis, planning of further management, and coordination of care.  MEDICATIONS: Scheduled Meds: . Amphetamine Sulfate  20 mg Oral BID  . aspirin  81 mg Oral Daily  . DULoxetine  60 mg Oral Daily  . enoxaparin (LOVENOX) injection  40 mg Subcutaneous Q24H  . fenofibrate  160 mg Oral Daily  . gabapentin  800 mg Oral TID  . insulin aspart  0-15 Units Subcutaneous TID WC  .  insulin aspart  15 Units Subcutaneous TID WC  . insulin glargine  60 Units Subcutaneous QHS  . pantoprazole  40 mg Oral QAC breakfast   Continuous Infusions: . sodium chloride 10 mL/hr at 03/27/18 0517  . vancomycin 200 mL/hr at 03/28/18 0559   PRN Meds:.acetaminophen, bisacodyl, calcium carbonate, HYDROcodone-acetaminophen, zolpidem   PHYSICAL EXAM: Vital signs: Vitals:   03/27/18 0900 03/27/18 1420 03/27/18 2121 03/28/18 0513  BP: 123/73 (!) 145/100 (!) 156/92 127/82  Pulse: 65 72 72 71  Resp: 16 20    Temp: (!) 97  F (36.1 C) 97.7 F (36.5 C) 98.6 F (37 C) 99.6 F (37.6 C)  TempSrc:   Oral   SpO2: 93% 100% 100% 93%  Weight:      Height:       Filed Weights   03/25/18 1441  Weight: 111.1 kg   Body mass index is 29.82 kg/m.   Exam  Awake Alert, Oriented X 3, No new F.N deficits, Normal affect [place].AT,PERRAL Supple Neck,No JVD, No cervical lymphadenopathy appriciated.  Symmetrical Chest wall movement, Good air movement bilaterally, CTAB RRR,No Gallops, Rubs or new Murmurs, No Parasternal Heave +ve B.Sounds, Abd Soft, No tenderness, No organomegaly appriciated, No rebound - guarding or rigidity. No Cyanosis,  left foot under bandage   I have personally reviewed following labs and imaging studies  LABORATORY DATA: CBC: Recent Labs  Lab 03/25/18 1456 03/26/18 0445 03/27/18 0418 03/28/18 0458  WBC 10.8* 10.2 8.3 8.1  NEUTROABS 8.4*  --   --   --   HGB 13.4 12.6* 11.8* 11.9*  HCT 43.1 39.0 36.6* 37.5*  MCV 89.6 88.8 86.5 88.2  PLT 276 242 257 282    Basic Metabolic Panel: Recent Labs  Lab 03/25/18 1456 03/26/18 0445 03/27/18 0418 03/28/18 0458  NA 130* 137 137 138  K 4.0 3.6 3.3* 3.9  CL 96* 103 103 100  CO2 24 26 25 28   GLUCOSE 406* 188* 115* 104*  BUN 12 8 6 8   CREATININE 1.19 0.87 0.87 1.06  CALCIUM 9.4 8.5* 9.0 9.4    GFR: Estimated Creatinine Clearance: 108.7 mL/min (by C-G formula based on SCr of 1.06 mg/dL).  Liver Function Tests: Recent Labs  Lab 03/25/18 1456  AST 16  ALT 17  ALKPHOS 90  BILITOT 1.0  PROT 7.3  ALBUMIN 3.6   No results for input(s): LIPASE, AMYLASE in the last 168 hours. No results for input(s): AMMONIA in the last 168 hours.  Coagulation Profile: Recent Labs  Lab 03/25/18 1456  INR 1.01    Cardiac Enzymes: No results for input(s): CKTOTAL, CKMB, CKMBINDEX, TROPONINI in the last 168 hours.  BNP (last 3 results) No results for input(s): PROBNP in the last 8760 hours.  HbA1C: Recent Labs    03/27/18 0418  HGBA1C  10.7*    CBG: Recent Labs  Lab 03/27/18 0926 03/27/18 1200 03/27/18 1647 03/27/18 2123 03/28/18 0812  GLUCAP 101* 86 207* 124* 131*    Lipid Profile: No results for input(s): CHOL, HDL, LDLCALC, TRIG, CHOLHDL, LDLDIRECT in the last 72 hours.  Thyroid Function Tests: No results for input(s): TSH, T4TOTAL, FREET4, T3FREE, THYROIDAB in the last 72 hours.  Anemia Panel: No results for input(s): VITAMINB12, FOLATE, FERRITIN, TIBC, IRON, RETICCTPCT in the last 72 hours.  Urine analysis:    Component Value Date/Time   COLORURINE YELLOW 03/25/2018 1736   APPEARANCEUR CLEAR 03/25/2018 1736   LABSPEC 1.027 03/25/2018 1736   PHURINE 5.0 03/25/2018 1736   GLUCOSEU >=500 (  A) 03/25/2018 1736   HGBUR NEGATIVE 03/25/2018 1736   BILIRUBINUR NEGATIVE 03/25/2018 1736   KETONESUR NEGATIVE 03/25/2018 1736   PROTEINUR NEGATIVE 03/25/2018 1736   NITRITE NEGATIVE 03/25/2018 1736   LEUKOCYTESUR NEGATIVE 03/25/2018 1736    Sepsis Labs: Lactic Acid, Venous    Component Value Date/Time   LATICACIDVEN 1.1 03/26/2018 6962    MICROBIOLOGY: Recent Results (from the past 240 hour(s))  Culture, blood (Routine x 2)     Status: None (Preliminary result)   Collection Time: 03/25/18  2:50 PM  Result Value Ref Range Status   Specimen Description SITE NOT SPECIFIED  Final   Special Requests   Final    BOTTLES DRAWN AEROBIC ONLY Blood Culture adequate volume   Culture   Final    NO GROWTH 2 DAYS Performed at [facility], [address], [place], [place] [address]    Report Status PENDING  Incomplete  Culture, blood (Routine x 2)     Status: None (Preliminary result)   Collection Time: 03/25/18  4:24 PM  Result Value Ref Range Status   Specimen Description BLOOD RIGHT FOREARM  Final   Special Requests   Final    BOTTLES DRAWN AEROBIC AND ANAEROBIC Blood Culture adequate volume   Culture   Final    NO GROWTH 2 DAYS Performed at [facility], [address] [place], [place]  [address]    Report Status PENDING  Incomplete  Aerobic Culture (superficial specimen) w precautions panel     Status: None (Preliminary result)   Collection Time: 03/26/18  5:32 PM  Result Value Ref Range Status   Specimen Description FOOT LEFT  Final   Special Requests NONE  Final   Gram Stain   Final    FEW WBC PRESENT, PREDOMINANTLY PMN FEW GRAM POSITIVE COCCI FEW GRAM POSITIVE RODS RARE GRAM NEGATIVE RODS    Culture   Final    CULTURE REINCUBATED FOR BETTER GROWTH Performed at [facility], [address], [place], [place] [address]    Report Status PENDING  Incomplete  Aerobic/Anaerobic Culture (surgical/deep wound)     Status: None (Preliminary result)   Collection Time: 03/27/18  8:19 AM  Result Value Ref Range Status   Specimen Description ABSCESS FOOT LEFT  Final   Special Requests A  Final   Gram Stain   Final    NO WBC SEEN NO ORGANISMS SEEN Performed at [facility], [address], [place], [place] [address]    Culture PENDING  Incomplete   Report Status PENDING  Incomplete  Aerobic/Anaerobic Culture (surgical/deep wound)     Status: None (Preliminary result)   Collection Time: 03/27/18  8:22 AM  Result Value Ref Range Status   Specimen Description BONE LEFT METATARSAL  Final   Special Requests 1  Final   Gram Stain   Final    NO WBC SEEN NO ORGANISMS SEEN Performed at [facility], [address], [place], [place] [address]    Culture PENDING  Incomplete   Report Status PENDING  Incomplete    RADIOLOGY STUDIES/RESULTS: Mr Foot Left W Wo Contrast  Result Date: 03/26/2018 CLINICAL DATA:  Infection of the left foot with bowel smelling ulcer along the plantar aspect. EXAM: MRI OF THE LEFT FOREFOOT WITHOUT AND WITH CONTRAST TECHNIQUE: Multiplanar, multisequence MR imaging of the left forefoot was performed both before and after administration of intravenous contrast. CONTRAST:  10 mL Gadavist COMPARISON:  None. FINDINGS: Bones/Joint/Cartilage  Soft tissue ulcer overlying the fourth MTP joint. Mild marrow edema in the fourth metatarsal head and base of the fourth proximal phalanx with a small joint effusion which may reflect reactive changes versus septic arthritis. No bone destruction. No fracture or dislocation. Normal alignment. No other joint effusion. Ligaments Collateral ligaments are intact.  Lisfranc ligament is intact. Muscles and Tendons Flexor, peroneal and extensor compartment tendons are intact. T2 hyperintense signal throughout the plantar musculature likely neurogenic. Soft tissue No fluid collection or hematoma. No soft tissue mass. Severe soft tissue edema in the dorsal aspect of the foot with enhancement most concerning for cellulitis. IMPRESSION: 1. Soft tissue ulcer overlying the fourth MTP joint. Mild marrow edema in the fourth metatarsal head and base of the fourth proximal phalanx with a small joint effusion which may reflect reactive changes versus septic arthritis. No drainable fluid collection to suggest an abscess. 2. Cellulitis along the dorsal aspect of the foot. Electronically Signed   By: Elige Ko   On: 03/26/2018 14:57   Dg Foot 2 Views Left  Result Date: 03/27/2018 CLINICAL DATA:  52 year old male postoperative day zero debridement of left foot wound with abscess, 4th metatarsal bone biopsy, possible osteomyelitis. EXAM: LEFT FOOT - 2 VIEW COMPARISON:  Left foot radiographs 03/25/2018. FINDINGS: AP and lateral portable views. Plantar MTP joint region wound with soft tissue swelling and mild soft tissue gas. Gas in the region of the 4th MTP on the AP view. No discrete bone biopsy site or fracture identified. Alignment preserved. Soft tissue swelling has increased since 03/25/2018. IMPRESSION: Soft tissue swelling with infectious and/or postoperative gas about the plantar 4th MTP. No acute osseous abnormality identified radiographically. Electronically Signed   By: Odessa Fleming M.D.   On: 03/27/2018 10:27   Dg Foot  Complete Left  Result Date: 03/25/2018 CLINICAL DATA:  Nonhealing ulcer on the distal aspect of the left foot EXAM: LEFT FOOT - COMPLETE 3+ VIEW COMPARISON:  None. FINDINGS: A soft tissue ulcer is noted along the plantar aspect of the foot consistent with the given clinical history. No bony erosion is identified to suggest osteomyelitis. No fracture or dislocation is seen. IMPRESSION: Soft tissue ulcer without changes of osteomyelitis. Electronically Signed   By: Alcide Clever M.D.   On: 03/25/2018 15:15   [place] Ekg Site Rite  Result Date: 03/28/2018 If Site Rite image not attached, placement could not be confirmed due to current cardiac rhythm.    LOS: 3 days   Signature  Susa Raring M.D on 03/28/2018 at 9:00 AM   -  To page go to [email or web address] - password [email or web address]

## 2018-03-28 NOTE — Progress Notes (Signed)
Pharmacy Antibiotic Note  [name] is a 52 y.o. male admitted on 03/25/2018 with L foot cellulitis. Noted purulent drainage.   Pt is POD#1 for I&D and bone bx of foot. May require partial amp if doesn't improve in the future. Podiatry would like to treat as if osteo. Culture still neg to date. WBC has improved 8.1. Scr 1.06. Vanc trough came back low at 7 this AM. We will target for a higher dose.   Plan: Change vanc to 1.5g IV q12 Will f/u micro data, renal function, and pt's clinical condition Vanc trough prn   Height: 6\' 4"  (193 cm) Weight: 245 lb (111.1 kg) IBW/kg (Calculated) : 86.8  Temp (24hrs), Avg:98.6 F (37 C), Min:97.7 F (36.5 C), Max:99.6 F (37.6 C)  Recent Labs  Lab 03/25/18 1456 03/25/18 1505 03/25/18 1637 03/26/18 0445 03/26/18 0633 03/27/18 0418 03/28/18 0458  WBC 10.8*  --   --  10.2  --  8.3 8.1  CREATININE 1.19  --   --  0.87  --  0.87 1.06  LATICACIDVEN  --  2.14* 2.37*  --  1.1  --   --   VANCOTROUGH  --   --   --   --   --   --  7*    Estimated Creatinine Clearance: 108.7 mL/min (by C-G formula based on SCr of 1.06 mg/dL).    Allergies  Allergen Reactions  . Lithium Other (See Comments)    "Made me feel like I was walking in chest-deep water. I didn't like it."  . Adderall [Amphetamine-Dextroamphetamine] Palpitations and Other (See Comments)    Hyperactivity, also  . Ritalin [Methylphenidate] Palpitations and Other (See Comments)    Hyperactivity, also    Antimicrobials this admission: 10/24 Vancomycin >>   Dose adjustments this admission: Vanc 1g q12>>1.5g IV q12  Microbiology results: 10/24 BCx: ngtd 10/26 foot abscess>>ngtd  Ulyses Southward, PharmD, [place], AAHIVP, CPP Infectious Disease Pharmacist Pager: [phone] 03/28/2018 1:38 PM

## 2018-03-28 NOTE — Progress Notes (Signed)
Peripherally Inserted Central Catheter/Midline Placement  The IV Nurse has discussed with the patient and/or persons authorized to consent for the patient, the purpose of this procedure and the potential benefits and risks involved with this procedure.  The benefits include less needle sticks, lab draws from the catheter, and the patient may be discharged home with the catheter. Risks include, but not limited to, infection, bleeding, blood clot (thrombus formation), and puncture of an artery; nerve damage and irregular heartbeat and possibility to perform a PICC exchange if needed/ordered by physician.  Alternatives to this procedure were also discussed.  Bard Power PICC patient education guide, fact sheet on infection prevention and patient information card has been provided to patient /or left at bedside.    PICC/Midline Placement Documentation  PICC Single Lumen 03/28/18 PICC Right Basilic 43 cm 0 cm (Active)  Indication for Insertion or Continuance of Line Limited venous access - need for IV therapy >5 days (PICC only) 03/28/2018  9:14 AM  Exposed Catheter (cm) 0 cm 03/28/2018  9:14 AM  Site Assessment Clean;Dry;Intact 03/28/2018  9:14 AM  Line Status Flushed;Saline locked;Blood return noted 03/28/2018  9:14 AM  Dressing Type Transparent 03/28/2018  9:14 AM  Dressing Status Clean;Dry;Intact;Antimicrobial disc in place 03/28/2018  9:14 AM  Line Care Connections checked and tightened 03/28/2018  9:14 AM  Line Adjustment (NICU/IV Team Only) No 03/28/2018  9:14 AM  Dressing Intervention New dressing 03/28/2018  9:14 AM  Dressing Change Due 04/04/18 03/28/2018  9:14 AM       Elliot Dally 03/28/2018, 9:16 AM

## 2018-03-28 NOTE — Progress Notes (Signed)
Orthopedic Tech Progress Note Patient Details:  [name] 07-Jan-1966 [id number]  Ortho Devices Type of Ortho Device: CAM walker Ortho Device/Splint Location: lle Ortho Device/Splint Interventions: Application   Post Interventions Patient Tolerated: Well Instructions Provided: Care of device   Nikki Dom 03/28/2018, 2:37 PM

## 2018-03-28 NOTE — Progress Notes (Signed)
Subjective: POD #1 s/p left foot incision and drainage from the wound excision, bone biopsy.  He states that he is doing well he denies any fevers, chills, nausea, vomiting.  He states that he has some mild discomfort when he gets up to put pressure on go to the bathroom otherwise he is doing well.  He states that when he first came into the hospital he was having significant pain he states this has resolved.  Otherwise he is doing well he has no other concerns.  Objective: AAO x3, NAD DP/PT pulses palpable bilaterally, CRT less than 3 seconds Wound present left foot submetatarsal 4.  Upon removal of the iodoform a small amount of purulence was still identified.  I did clean the wound and I irrigated with saline and there is no further purulence identified.  At the conclusion of the surgery yesterday there is no further purulence or signs of an abscess after the procedure.  There is still erythema to the dorsal aspect of the foot however overall the edema has improved to the foot and ankle.  Still warmth of the foot.  There is no ascending cellulitis.  There is no fluctuation or crepitation or any drainable abscess like identified today.  No significant malodor.  Superficial wound posterior heel without signs of infection  No pain with calf compression, swelling, warmth, erythema  Assessment: Postop day #1 status post left foot incision and drainage, wound debridement, bone biopsy  Plan: -All treatment options discussed with the patient including all alternatives, risks, complications.  -X-rays were reviewed.  This is normal postoperative area that is seen on x-ray as the wound is packed open.  The wound does probe to the third and fourth interspaces which corresponds to the area of radiolucency on x-ray. -Overall the swelling has improved, still concerned about erythema of the dorsal foot.  On him to continue with IV antibiotics and await cultures.  We discussed possibly fourth partial ray  amputation is erythema does not significantly improve.  Overall swelling and pain has improved.  -PICC line placed today for antibiotics as I do want to at least treat this for osteomyelitis as there was purulence noted around the 4th MTPJ and bone.  -Elevation -Will have nursing change the bandage today and I will see him in the morning for a dressing change to evaluate the wound.  -I discussed the plan with him in detail and he is in agreement.   Ovid Curd, DPM O: [phone] C: [phone]

## 2018-03-29 LAB — GLUCOSE, CAPILLARY
GLUCOSE-CAPILLARY: 142 mg/dL — AB (ref 70–99)
GLUCOSE-CAPILLARY: 117 mg/dL — AB (ref 70–99)
GLUCOSE-CAPILLARY: 110 mg/dL — AB (ref 70–99)
Glucose-Capillary: 98 mg/dL (ref 70–99)

## 2018-03-29 LAB — MRSA PCR SCREENING: MRSA BY PCR: NEGATIVE

## 2018-03-29 MED ORDER — SODIUM CHLORIDE 0.9 % IV SOLN
1.0000 g | INTRAVENOUS | Status: DC
  Administered 2018-03-29 – 2018-03-30 (×2): 1000 mg via INTRAVENOUS
  Filled 2018-03-29 (×2): qty 1

## 2018-03-29 NOTE — Progress Notes (Signed)
[facility]  [facility] will provide Home Infusion Pharmacy services and [facility] nursing for home upon DC.  [facility] Infusion Coordinator will provide hands on IVABX teaching prior to DC.  If patient discharges after hours, please call [phone].   Sedalia Muta 03/29/2018, 4:28 PM

## 2018-03-29 NOTE — Care Management Note (Addendum)
Case Management Note  Patient Details  Name: [name] MRN: [id number] Date of Birth: Feb 07, 1966  Subjective/Objective:     Postop day #2 status post left foot incision and drainage, wound debridement, bone biopsy             Action/Plan: Patient lives at home; WUJ:WJXBJ, Alverda Skeans, MD; has private insurance with BCBS with prescription drug coverage; need 6 weeks of IV abx; HHC choice offered, pt chose [facility]; Pam with [facility] called for arrangements; he is requesting a rolling walker and crutches at discharge; CM will continue to follow for progression of care.  Expected Discharge Date:    possibly 03/30/2018              Expected Discharge Plan:  Home w Home Health Services  Discharge planning Services  CM Consult  DME Arranged:   RW, crutches DME Agency:   [facility]  HH Arranged:  RN [facility] Agency:  [facility]  Status of Service:  In process, will continue to follow  Reola Mosher [phone] 03/29/2018, 8:47 AM

## 2018-03-29 NOTE — Progress Notes (Signed)
CRITICAL VALUE ALERT  Critical Value:  Microbiology - Bone Left Metatarsal biopsy - rare group B strep isolated  Date & Time Notied:  03/29/2018; 16:40  Provider Notified: Dr. Thedore Mins  Orders Received/Actions taken:

## 2018-03-29 NOTE — Progress Notes (Signed)
PROGRESS NOTE        PATIENT DETAILS Name: [name] Age: 52 y.o. Sex: male Date of Birth: May 20, 1966 Admit Date: 03/25/2018 Admitting Physician Alessandra Bevels, MD ONG:EXBMW, Alehegn, MD  Brief Narrative: Patient is a 52 y.o. 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  Thought to have a soft tissue infection and admitted to the hospitalist service.  Subjective:  Patient in bed, appears comfortable, denies any headache, no fever, no chest pain or pressure, no shortness of breath , no abdominal pain. No focal weakness.   Assessment/Plan:  Sepsis secondary to left foot soft tissue infection -patient stable, status post left foot plantar surface abscess incision and drainage by podiatrist Dr. Ardelle Anton on 03/27/2018, cultures unrevealing so far, MRSA PCR ordered, case discussed with ID physician Dr. Orvan Falconer on 03/29/2018 we will transition to IV ertapenem empirically unless cultures guide otherwise, PICC line to right arm has been placed on 03/28/2018.  Blood cultures negative.  Cam boot to be provided per podiatry, likely discharge tomorrow if cultures remain negative and he is clinically stable.   DM-1: CBG's on the higher side-increase Lantus to 60 units, continue SSI.  Follow.  He uses insulin pump to resume upon discharge.  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  Dyslipidemia: Continue fenofibrate.    DVT Prophylaxis: Prophylactic Lovenox   Code Status: Full code   Family Communication: None at bedside  Disposition Plan: Home in am  Antimicrobial agents: Anti-infectives (From admission, onward)   Start     Dose/Rate  Route Frequency Ordered Stop   03/28/18 1400  vancomycin (VANCOCIN) 1,500 mg in sodium chloride 0.9 % 500 mL IVPB     1,500 mg 250 mL/hr over 120 Minutes Intravenous Every 12 hours 03/28/18 1336     03/26/18 0600  vancomycin (VANCOCIN) IVPB 1000 mg/200 mL premix  Status:  Discontinued     1,000 mg 200 mL/hr over 60 Minutes Intravenous Every 12 hours 03/25/18 1708 03/28/18 1336   03/25/18 1715  vancomycin (VANCOCIN) 2,000 mg in sodium chloride 0.9 % 500 mL IVPB     2,000 mg 250 mL/hr over 120 Minutes Intravenous STAT 03/25/18 1702 03/25/18 1956   03/25/18 1700  vancomycin (VANCOCIN) IVPB 1000 mg/200 mL premix  Status:  Discontinued     1,000 mg 200 mL/hr over 60 Minutes Intravenous  Once 03/25/18 1655 03/25/18 1702   03/25/18 1700  cefTRIAXone (ROCEPHIN) 2 g in sodium chloride 0.9 % 100 mL IVPB  Status:  Discontinued     2 g 200 mL/hr over 30 Minutes Intravenous Every 24 hours 03/25/18 1655 03/26/18 0952      Procedures:  Left foot plantar aspect abscess with possible osteomyelitis.  Incision and drainage and debridement of the wound done by podiatrist Dr. Ardelle Anton on 03/28/2015.  CONSULTS:  None  Time spent: 25 minutes-Greater than 50% of this time was spent in counseling, explanation of diagnosis, planning of further management, and coordination of care.  MEDICATIONS: Scheduled Meds: . Amphetamine Sulfate  20 mg Oral BID  . aspirin  81 mg Oral  Daily  . DULoxetine  60 mg Oral Daily  . enoxaparin (LOVENOX) injection  40 mg Subcutaneous Q24H  . fenofibrate  160 mg Oral Daily  . gabapentin  800 mg Oral TID  . insulin aspart  0-15 Units Subcutaneous TID WC  . insulin aspart  15 Units Subcutaneous TID WC  . insulin glargine  60 Units Subcutaneous QHS  . pantoprazole  40 mg Oral QAC breakfast  . sodium chloride flush  10-40 mL Intracatheter Q12H   Continuous Infusions: . sodium chloride 10 mL/hr at 03/27/18 0517  . vancomycin 1,500 mg (03/29/18 0117)   PRN Meds:.acetaminophen,  bisacodyl, calcium carbonate, HYDROcodone-acetaminophen, sodium chloride flush, zolpidem   PHYSICAL EXAM: Vital signs: Vitals:   03/28/18 2139 03/29/18 0509 03/29/18 0514 03/29/18 0800  BP: 130/77 126/70  130/80  Pulse: 70 (!) 35 74   Resp:      Temp: 98.7 F (37.1 C) 98 F (36.7 C)    TempSrc:      SpO2: 96% 99%    Weight:      Height:       Filed Weights   03/25/18 1441  Weight: 111.1 kg   Body mass index is 29.82 kg/m.   Exam  Awake Alert, Oriented X 3, No new F.N deficits, Normal affect [place].AT,PERRAL Supple Neck,No JVD, No cervical lymphadenopathy appriciated.  Symmetrical Chest wall movement, Good air movement bilaterally, CTAB RRR,No Gallops, Rubs or new Murmurs, No Parasternal Heave +ve B.Sounds, Abd Soft, No tenderness, No organomegaly appriciated, No rebound - guarding or rigidity. No Cyanosis,  left foot under bandage   I have personally reviewed following labs and imaging studies  LABORATORY DATA: CBC: Recent Labs  Lab 03/25/18 1456 03/26/18 0445 03/27/18 0418 03/28/18 0458  WBC 10.8* 10.2 8.3 8.1  NEUTROABS 8.4*  --   --   --   HGB 13.4 12.6* 11.8* 11.9*  HCT 43.1 39.0 36.6* 37.5*  MCV 89.6 88.8 86.5 88.2  PLT 276 242 257 282    Basic Metabolic Panel: Recent Labs  Lab 03/25/18 1456 03/26/18 0445 03/27/18 0418 03/28/18 0458  NA 130* 137 137 138  K 4.0 3.6 3.3* 3.9  CL 96* 103 103 100  CO2 24 26 25 28   GLUCOSE 406* 188* 115* 104*  BUN 12 8 6 8   CREATININE 1.19 0.87 0.87 1.06  CALCIUM 9.4 8.5* 9.0 9.4    GFR: Estimated Creatinine Clearance: 108.7 mL/min (by C-G formula based on SCr of 1.06 mg/dL).  Liver Function Tests: Recent Labs  Lab 03/25/18 1456  AST 16  ALT 17  ALKPHOS 90  BILITOT 1.0  PROT 7.3  ALBUMIN 3.6   No results for input(s): LIPASE, AMYLASE in the last 168 hours. No results for input(s): AMMONIA in the last 168 hours.  Coagulation Profile: Recent Labs  Lab 03/25/18 1456  INR 1.01    Cardiac  Enzymes: No results for input(s): CKTOTAL, CKMB, CKMBINDEX, TROPONINI in the last 168 hours.  BNP (last 3 results) No results for input(s): PROBNP in the last 8760 hours.  HbA1C: Recent Labs    03/27/18 0418  HGBA1C 10.7*    CBG: Recent Labs  Lab 03/28/18 0812 03/28/18 1248 03/28/18 1726 03/28/18 2140 03/29/18 0807  GLUCAP 131* 135* 157* 187* 142*    Lipid Profile: No results for input(s): CHOL, HDL, LDLCALC, TRIG, CHOLHDL, LDLDIRECT in the last 72 hours.  Thyroid Function Tests: No results for input(s): TSH, T4TOTAL, FREET4, T3FREE, THYROIDAB in the last 72 hours.  Anemia Panel: No  results for input(s): VITAMINB12, FOLATE, FERRITIN, TIBC, IRON, RETICCTPCT in the last 72 hours.  Urine analysis:    Component Value Date/Time   COLORURINE YELLOW 03/25/2018 1736   APPEARANCEUR CLEAR 03/25/2018 1736   LABSPEC 1.027 03/25/2018 1736   PHURINE 5.0 03/25/2018 1736   GLUCOSEU >=500 (A) 03/25/2018 1736   HGBUR NEGATIVE 03/25/2018 1736   BILIRUBINUR NEGATIVE 03/25/2018 1736   KETONESUR NEGATIVE 03/25/2018 1736   PROTEINUR NEGATIVE 03/25/2018 1736   NITRITE NEGATIVE 03/25/2018 1736   LEUKOCYTESUR NEGATIVE 03/25/2018 1736    Sepsis Labs: Lactic Acid, Venous    Component Value Date/Time   LATICACIDVEN 1.1 03/26/2018 1610    MICROBIOLOGY: Recent Results (from the past 240 hour(s))  Culture, blood (Routine x 2)     Status: None (Preliminary result)   Collection Time: 03/25/18  2:50 PM  Result Value Ref Range Status   Specimen Description SITE NOT SPECIFIED  Final   Special Requests   Final    BOTTLES DRAWN AEROBIC ONLY Blood Culture adequate volume   Culture   Final    NO GROWTH 3 DAYS Performed at [facility], [address], [place], [place] [address]    Report Status PENDING  Incomplete  Culture, blood (Routine x 2)     Status: None (Preliminary result)   Collection Time: 03/25/18  4:24 PM  Result Value Ref Range Status   Specimen Description BLOOD  RIGHT FOREARM  Final   Special Requests   Final    BOTTLES DRAWN AEROBIC AND ANAEROBIC Blood Culture adequate volume   Culture   Final    NO GROWTH 3 DAYS Performed at [facility], [address] [place], [place] [address]    Report Status PENDING  Incomplete  Aerobic Culture (superficial specimen) w precautions panel     Status: None (Preliminary result)   Collection Time: 03/26/18  5:32 PM  Result Value Ref Range Status   Specimen Description FOOT LEFT  Final   Special Requests NONE  Final   Gram Stain   Final    FEW WBC PRESENT, PREDOMINANTLY PMN FEW GRAM POSITIVE COCCI FEW GRAM POSITIVE RODS RARE GRAM NEGATIVE RODS    Culture   Final    CULTURE REINCUBATED FOR BETTER GROWTH Performed at [facility], [address] [place], [place] [address]    Report Status PENDING  Incomplete  Aerobic/Anaerobic Culture (surgical/deep wound)     Status: None (Preliminary result)   Collection Time: 03/27/18  8:19 AM  Result Value Ref Range Status   Specimen Description ABSCESS FOOT LEFT  Final   Special Requests A  Final   Gram Stain NO WBC SEEN NO ORGANISMS SEEN   Final   Culture   Final    CULTURE REINCUBATED FOR BETTER GROWTH Performed at [facility], [address], [place], [place] [address]    Report Status PENDING  Incomplete  Aerobic/Anaerobic Culture (surgical/deep wound)     Status: None (Preliminary result)   Collection Time: 03/27/18  8:22 AM  Result Value Ref Range Status   Specimen Description BONE LEFT METATARSAL  Final   Special Requests 1  Final   Gram Stain NO WBC SEEN NO ORGANISMS SEEN   Final   Culture   Final    CULTURE REINCUBATED FOR BETTER GROWTH Performed at [facility], [address] [place], [place] [address]    Report Status PENDING  Incomplete    RADIOLOGY STUDIES/RESULTS:  Mr Foot Left  W Wo Contrast  Result Date: 03/26/2018 CLINICAL DATA:  Infection of the left foot with bowel smelling ulcer along the plantar  aspect. EXAM: MRI OF THE LEFT FOREFOOT WITHOUT AND WITH CONTRAST TECHNIQUE: Multiplanar, multisequence MR imaging of the left forefoot was performed both before and after administration of intravenous contrast. CONTRAST:  10 mL Gadavist COMPARISON:  None. FINDINGS: Bones/Joint/Cartilage Soft tissue ulcer overlying the fourth MTP joint. Mild marrow edema in the fourth metatarsal head and base of the fourth proximal phalanx with a small joint effusion which may reflect reactive changes versus septic arthritis. No bone destruction. No fracture or dislocation. Normal alignment. No other joint effusion. Ligaments Collateral ligaments are intact.  Lisfranc ligament is intact. Muscles and Tendons Flexor, peroneal and extensor compartment tendons are intact. T2 hyperintense signal throughout the plantar musculature likely neurogenic. Soft tissue No fluid collection or hematoma. No soft tissue mass. Severe soft tissue edema in the dorsal aspect of the foot with enhancement most concerning for cellulitis. IMPRESSION: 1. Soft tissue ulcer overlying the fourth MTP joint. Mild marrow edema in the fourth metatarsal head and base of the fourth proximal phalanx with a small joint effusion which may reflect reactive changes versus septic arthritis. No drainable fluid collection to suggest an abscess. 2. Cellulitis along the dorsal aspect of the foot. Electronically Signed   By: Elige Ko   On: 03/26/2018 14:57   Dg Foot 2 Views Left  Result Date: 03/27/2018 CLINICAL DATA:  52 year old male postoperative day zero debridement of left foot wound with abscess, 4th metatarsal bone biopsy, possible osteomyelitis. EXAM: LEFT FOOT - 2 VIEW COMPARISON:  Left foot radiographs 03/25/2018. FINDINGS: AP and lateral portable views. Plantar MTP joint region wound with soft tissue swelling and mild soft tissue gas. Gas in the region of the 4th MTP on the AP view. No discrete bone biopsy site or fracture identified. Alignment preserved. Soft  tissue swelling has increased since 03/25/2018. IMPRESSION: Soft tissue swelling with infectious and/or postoperative gas about the plantar 4th MTP. No acute osseous abnormality identified radiographically. Electronically Signed   By: Odessa Fleming M.D.   On: 03/27/2018 10:27   Dg Foot Complete Left  Result Date: 03/25/2018 CLINICAL DATA:  Nonhealing ulcer on the distal aspect of the left foot EXAM: LEFT FOOT - COMPLETE 3+ VIEW COMPARISON:  None. FINDINGS: A soft tissue ulcer is noted along the plantar aspect of the foot consistent with the given clinical history. No bony erosion is identified to suggest osteomyelitis. No fracture or dislocation is seen. IMPRESSION: Soft tissue ulcer without changes of osteomyelitis. Electronically Signed   By: Alcide Clever M.D.   On: 03/25/2018 15:15   [place] Ekg Site Rite  Result Date: 03/28/2018 If Site Rite image not attached, placement could not be confirmed due to current cardiac rhythm.    LOS: 4 days   Signature  Susa Raring M.D on 03/29/2018 at 10:21 AM   -  To page go to [email or web address] - password [email or web address]

## 2018-03-29 NOTE — Progress Notes (Signed)
Subjective: POD #2 s/p left foot incision and drainage from the wound excision, bone biopsy.  He states that overall he is feeling well.  Denies any fevers, chills, nausea, vomiting.  She denies any calf pain, chest pain, shortness of breath.  Objective: AAO x3, NAD DP/PT pulses palpable bilaterally, CRT less than 3 seconds Wound present left foot submetatarsal 4.  Wound continues to probe along the third and fourth interspaces.  Today however unable to identify any purulence and there is no purulence in the wound.  I did try to further decompress the interspaces as well and there was no further signs of an abscess or purulence.  Overall the edema to the foot is much improved.  There is also improvement in the erythema compared to yesterday.  There is still some localized erythema on the fourth and fifth toes on the dorsal interspaces but overall the cellulitis is not improving and much improved compared to what it was prior to surgery.  There is no fluctuation or crepitation.  Mild increase in warmth of the foot.  No ascending cellulitis.  There is no malodor. Superficial wound posterior heel which is starting to scab over without any surrounding erythema, drainage or pus. Superficial wound posterior heel without signs of infection  No pain with calf compression, swelling, warmth, erythema  Assessment: Postop day #2 status post left foot incision and drainage, wound debridement, bone biopsy  Plan: -All treatment options discussed with the patient including all alternatives, risks, complications.  -Overall evaluated there is no purulence identified today.  Wound was irrigated with saline I packed it with iodoform packing applied by dry sterile dressing. He will need home health care once he goes home. Flush wound with saline. Pack with iodoform, cover with DSD, ACE wrap.  -NWB -Elevation -Continue IV antibiotics. Awaiting culture. Would touch base with ID about antibiotics. I would recommend  treating for osteomyelitis given the infection that was in the joint and around the bone.  -Will continue to follow  Ovid Curd, DPM O: [phone] C: [phone]

## 2018-03-29 NOTE — Progress Notes (Signed)
Pharmacy Antibiotic Note  [name] is a 52 y.o. male admitted on 03/25/2018 with L foot cellulitis. Noted purulent drainage.   Pt is POD#1 for I&D and bone bx of foot. May require partial amp if doesn't improve in the future. Podiatry would like to treat as if osteo. Culture still neg to date. WBC has improved 8.1. Scr 1.06. ID curbside consult obtained and suggested ertapenem.   Plan: Continue  vanc to 1.5g IV q12 Start ertapenem 1gm IV q24h Will f/u micro data, renal function, and pt's clinical condition Vanc trough prn   Height: 6\' 4"  (193 cm) Weight: 245 lb (111.1 kg) IBW/kg (Calculated) : 86.8  Temp (24hrs), Avg:98.4 F (36.9 C), Min:98 F (36.7 C), Max:98.7 F (37.1 C)  Recent Labs  Lab 03/25/18 1456 03/25/18 1505 03/25/18 1637 03/26/18 0445 03/26/18 0633 03/27/18 0418 03/28/18 0458  WBC 10.8*  --   --  10.2  --  8.3 8.1  CREATININE 1.19  --   --  0.87  --  0.87 1.06  LATICACIDVEN  --  2.14* 2.37*  --  1.1  --   --   VANCOTROUGH  --   --   --   --   --   --  7*    Estimated Creatinine Clearance: 108.7 mL/min (by C-G formula based on SCr of 1.06 mg/dL).    Allergies  Allergen Reactions  . Lithium Other (See Comments)    "Made me feel like I was walking in chest-deep water. I didn't like it."  . Adderall [Amphetamine-Dextroamphetamine] Palpitations and Other (See Comments)    Hyperactivity, also  . Ritalin [Methylphenidate] Palpitations and Other (See Comments)    Hyperactivity, also    Antimicrobials this admission: 10/24 Vancomycin >> 10/28 Ertepenem>>   Dose adjustments this admission: Vanc 1g q12>>1.5g IV q12  Microbiology results: 10/24 BCx: ngtd 10/26 foot abscess>> 10/25: foot wound GNR, GPR, GPC   Teandra Harlan A. Jeanella Craze, PharmD, BCPS Clinical Pharmacist [place] Pager: [phone] Please utilize Amion for appropriate phone number to reach the unit pharmacist [facility])   03/29/2018 10:23 AM

## 2018-03-30 LAB — CULTURE, BLOOD (ROUTINE X 2)
CULTURE: NO GROWTH
Culture: NO GROWTH
SPECIAL REQUESTS: ADEQUATE
Special Requests: ADEQUATE

## 2018-03-30 LAB — GLUCOSE, CAPILLARY
GLUCOSE-CAPILLARY: 165 mg/dL — AB (ref 70–99)
GLUCOSE-CAPILLARY: 74 mg/dL (ref 70–99)

## 2018-03-30 LAB — AEROBIC CULTURE W GRAM STAIN (SUPERFICIAL SPECIMEN)

## 2018-03-30 LAB — AEROBIC CULTURE  (SUPERFICIAL SPECIMEN)

## 2018-03-30 MED ORDER — ERTAPENEM IV (FOR PTA / DISCHARGE USE ONLY): 1.0000 g | INTRAVENOUS | 0 refills | Status: AC

## 2018-03-30 MED ORDER — INFLUENZA VAC SPLIT QUAD 0.5 ML IM SUSY
0.5000 mL | PREFILLED_SYRINGE | Freq: Once | INTRAMUSCULAR | Status: AC
  Administered 2018-03-30: 0.5 mL via INTRAMUSCULAR
  Filled 2018-03-30: qty 0.5

## 2018-03-30 MED ORDER — HEPARIN SOD (PORK) LOCK FLUSH 100 UNIT/ML IV SOLN
250.0000 [IU] | INTRAVENOUS | Status: AC | PRN
  Administered 2018-03-30: 250 [IU]

## 2018-03-30 NOTE — Progress Notes (Signed)
Discharge Planning: Please see previous NCM notes. [facility] will follow up with pt today to continue teaching on IV abx. Contacted [facility] for RW to be delivered to room prior to dc. Contacted Ortho Tech for crutches to be delivered to room prior to room. Provided pt with info on [employer] to rent a Knee Walker. Isidoro Donning RN CCM Case Mgmt phone [phone]

## 2018-03-30 NOTE — Discharge Summary (Signed)
[name]:[id number] DOB: [birth year] DOA: 03/25/2018  PCP: [name], MD  Admit date: 03/25/2018  Discharge date: 03/30/2018  Admitted From: Home   Disposition:  Home   Recommendations for Outpatient Follow-up:   Follow up with PCP in 1-2 weeks  PCP Please obtain BMP/CBC, 2 view CXR in 1week,  (see Discharge instructions)   PCP Please follow up on the following pending results:    Home Health: RN,PT   Equipment/Devices: None  Consultations: Podiatry, ID Discharge Condition: Stable   CODE STATUS: Full   Diet Recommendation: Heart Healthy Low Carb    Chief Complaint  Patient presents with  . Leg Pain     Brief history of present illness from the day of admission and additional interim summary    Patient is a 52 y.o. 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  Thought to have a soft tissue infection and admitted to the hospitalist service.                                                                  Hospital Course   Sepsis secondary to left foot soft tissue infection -patient stable, status post left foot plantar surface abscess incision and drainage by podiatrist [name] on 03/27/2018, cultures unrevealing so far, MRSA PCR ordered, case discussed with ID physician [name] on 03/29/2018 we will transition to IV ertapenem empirically for 6 weeks, PICC line to right arm has been placed on 03/28/2018.  Blood cultures negative.  Cam boot to be provided per podiatry, note superficial cultures were polymicrobial and one deep tissue bone culture showed group B Strep however ID physician [name] thinks that the infection more likely is polymicrobial in nature and hence  patient will benefit from 6 weeks of IV ertapenem.   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  Dyslipidemia: Continue fenofibrate.    Discharge diagnosis     Active Problems:   Sepsis [facility])    Discharge instructions    Discharge Instructions    Discharge instructions   Complete by:  As directed    Follow with Primary MD [name], MD in 7 days   Get CBC, BMP checked  by Primary MD  in 5-7 days   Activity: As tolerated with Full fall precautions use walker/cane & assistance as needed, wear the cam boot in the affected leg at all times when out  of the bed  Disposition Home    Diet: Heart Healthy Low Carb, check CBGs QA CHS  Special Instructions: If you have smoked or chewed Tobacco  in the last 2 yrs please stop smoking, stop any regular Alcohol  and or any Recreational drug use.  On your next visit with your primary care physician please Get Medicines reviewed and adjusted.  Please request your Prim.MD to go over all Hospital Tests and Procedure/Radiological results at the follow up, please get all Hospital records sent to your Prim MD by signing hospital release before you go home.  If you experience worsening of your admission symptoms, develop shortness of breath, life threatening emergency, suicidal or homicidal thoughts you must seek medical attention immediately by calling 911 or calling your MD immediately  if symptoms less severe.  You Must read complete instructions/literature along with all the possible adverse reactions/side effects for all the Medicines you take and that have been prescribed to you. Take any new Medicines after you have completely understood and  accpet all the possible adverse reactions/side effects.   Home infusion instructions [facility] May follow [place] Dosing Protocol; May administer Cathflo as needed to maintain patency of vascular access device.; Flushing of vascular access device: per [facility] Protocol: 0.9% NaCl pre/post medica...   Complete by:  As directed    Instructions:  May follow [place] Dosing Protocol   Instructions:  May administer Cathflo as needed to maintain patency of vascular access device.   Instructions:  Flushing of vascular access device: per [facility] Protocol: 0.9% NaCl pre/post medication administration and prn patency; Heparin 100 u/ml, 72m for implanted ports and Heparin 10u/ml, 522mfor all other central venous catheters.   Instructions:  May follow [facility] Anaphylaxis Protocol for First Dose Administration in the home: 0.9% NaCl at 25-50 ml/hr to maintain IV access for protocol meds. Epinephrine 0.3 ml IV/IM PRN and Benadryl 25-50 IV/IM PRN s/s of anaphylaxis.   Instructions:  [facility]fusion Coordinator (RN) to assist per patient IV care needs in the home PRN.   Increase activity slowly   Complete by:  As directed       Discharge Medications   Allergies as of 03/30/2018      Reactions   Lithium Other (See Comments)   "Made me feel like I was walking in chest-deep water. I didn't like it."   Adderall [amphetamine-dextroamphetamine] Palpitations, Other (See Comments)   Hyperactivity, also   Ritalin [methylphenidate] Palpitations, Other (See Comments)   Hyperactivity, also      Medication List    STOP taking these medications   amoxicillin-clavulanate 875-125 MG tablet Commonly known as:  AUGMENTIN   mupirocin ointment 2 % Commonly known as:  BACTROBAN     TAKE these medications   aspirin 81 MG tablet Take 1 tablet (81 mg total) by mouth daily.   calcium carbonate 500 MG chewable tablet Commonly known as:  TUMS - dosed in mg elemental calcium Chew 1 tablet by mouth as  needed for indigestion or heartburn.   DULoxetine 60 MG capsule Commonly known as:  CYMBALTA Take 60 mg by mouth daily.   ertapenem  IVPB Commonly known as:  INVANZ Inject 1 g into the vein daily. Indication:  Osteomyelitis Last Day of Therapy:  05/10/18 Labs - Once weekly:  CBC/D and BMP, Labs - Every other week:  ESR and CRP   EVEKEO 10 MG Tabs Generic drug:  Amphetamine Sulfate Take 20 mg by mouth 2 (two)  times daily.   fenofibrate 160 MG tablet Take 160 mg by mouth daily.   fluconazole 100 MG tablet Commonly known as:  DIFLUCAN Take 2 tablets by mouth on day 1 then take 1 tablet daily until gone   FREESTYLE LIBRE [place] every 14 (fourteen) days.   gabapentin 800 MG tablet Commonly known as:  NEURONTIN Take 800 mg by mouth 3 (three) times daily.   insulin NPH-regular Human (70-30) 100 UNIT/ML injection Commonly known as:  NOVOLIN 70/30 Insulin NPH 40 units in am and 30 Units at night time.   LANTUS SOLOSTAR 100 UNIT/ML Solostar Pen Generic drug:  Insulin Glargine Inject 70 Units into the skin at bedtime.   NOVOLOG FLEXPEN 100 UNIT/ML FlexPen Generic drug:  insulin aspart Inject 22-32 Units into the skin See admin instructions. Inject 22-32 units into the skin three to four times a day before meals, per sliding scale   pantoprazole 40 MG tablet Commonly known as:  PROTONIX Take 1 tablet (40 mg total) by mouth daily before breakfast.   zolpidem 10 MG tablet Commonly known as:  AMBIEN Take 10 mg by mouth at bedtime as needed for sleep.            Home Infusion Instuctions  (From admission, onward)         Start     Ordered   03/30/18 0000  Home infusion instructions [facility] May follow [place] Dosing Protocol; May administer Cathflo as needed to maintain patency of vascular access device.; Flushing of vascular access device: per [facility] Protocol: 0.9% NaCl pre/post medica...    Question Answer Comment  Instructions May follow [place] Dosing Protocol   Instructions May administer Cathflo as needed to maintain patency of vascular access device.   Instructions Flushing of vascular access device: per [facility] Protocol: 0.9% NaCl pre/post medication administration and prn patency; Heparin 100 u/ml, 6m for implanted ports and Heparin 10u/ml, 558mfor all other central venous catheters.   Instructions May follow [facility] Anaphylaxis Protocol for First Dose Administration in the home: 0.9% NaCl at 25-50 ml/hr to maintain IV access for protocol meds. Epinephrine 0.3 ml IV/IM PRN and Benadryl 25-50 IV/IM PRN s/s of anaphylaxis.   Instructions [facility] Infusion Coordinator (RN) to assist per patient IV care needs in the home PRN.      03/30/18 0840           Durable Medical Equipment  (From admission, onward)         Start     Ordered   03/30/18 1041  For home use only DME Walker rolling  Once    Question:  Patient needs a walker to treat with the following condition  Answer:  Left foot infection   03/30/18 1045          Follow-up Information    Asres, Alehegn, MD. Schedule an appointment as soon as possible for a visit in 1 week(s).   Specialty:  Family Medicine Contact information: [address] [address] [place] [phone]      WaTrula SladeDPConnecticutSchedule an appointment as soon as possible for a visit in 1 week(s).   Specialty:  Podiatry Contact information: [phone]      Health, [facility] Follow up.   Specialty:  Home Health Services Why:  Home Health Physical Therapy, RN -agency will call to arrange initial  visit Contact information: [address] [place] [address] [phone]           Major procedures and Radiology Reports - PLEASE review detailed and final reports thoroughly  -         Mr Foot Left W Wo Contrast  Result Date: 03/26/2018 CLINICAL DATA:   Infection of the left foot with bowel smelling ulcer along the plantar aspect. EXAM: MRI OF THE LEFT FOREFOOT WITHOUT AND WITH CONTRAST TECHNIQUE: Multiplanar, multisequence MR imaging of the left forefoot was performed both before and after administration of intravenous contrast. CONTRAST:  10 mL Gadavist COMPARISON:  None. FINDINGS: Bones/Joint/Cartilage Soft tissue ulcer overlying the fourth MTP joint. Mild marrow edema in the fourth metatarsal head and base of the fourth proximal phalanx with a small joint effusion which may reflect reactive changes versus septic arthritis. No bone destruction. No fracture or dislocation. Normal alignment. No other joint effusion. Ligaments Collateral ligaments are intact.  Lisfranc ligament is intact. Muscles and Tendons Flexor, peroneal and extensor compartment tendons are intact. T2 hyperintense signal throughout the plantar musculature likely neurogenic. Soft tissue No fluid collection or hematoma. No soft tissue mass. Severe soft tissue edema in the dorsal aspect of the foot with enhancement most concerning for cellulitis. IMPRESSION: 1. Soft tissue ulcer overlying the fourth MTP joint. Mild marrow edema in the fourth metatarsal head and base of the fourth proximal phalanx with a small joint effusion which may reflect reactive changes versus septic arthritis. No drainable fluid collection to suggest an abscess. 2. Cellulitis along the dorsal aspect of the foot. Electronically Signed   By: Kathreen Devoid   On: 03/26/2018 14:57   Dg Foot 2 Views Left  Result Date: 03/27/2018 CLINICAL DATA:  52 year old male postoperative day zero debridement of left foot wound with abscess, 4th metatarsal bone biopsy, possible osteomyelitis. EXAM: LEFT FOOT - 2 VIEW COMPARISON:  Left foot radiographs 03/25/2018. FINDINGS: AP and lateral portable views. Plantar MTP joint region wound with soft tissue swelling and mild soft tissue gas. Gas in the region of the 4th MTP on the AP view. No  discrete bone biopsy site or fracture identified. Alignment preserved. Soft tissue swelling has increased since 03/25/2018. IMPRESSION: Soft tissue swelling with infectious and/or postoperative gas about the plantar 4th MTP. No acute osseous abnormality identified radiographically. Electronically Signed   By: Genevie Ann M.D.   On: 03/27/2018 10:27   Dg Foot Complete Left  Result Date: 03/25/2018 CLINICAL DATA:  Nonhealing ulcer on the distal aspect of the left foot EXAM: LEFT FOOT - COMPLETE 3+ VIEW COMPARISON:  None. FINDINGS: A soft tissue ulcer is noted along the plantar aspect of the foot consistent with the given clinical history. No bony erosion is identified to suggest osteomyelitis. No fracture or dislocation is seen. IMPRESSION: Soft tissue ulcer without changes of osteomyelitis. Electronically Signed   By: Inez Catalina M.D.   On: 03/25/2018 15:15   [place] Ekg Site Rite  Result Date: 03/28/2018 If Site Rite image not attached, placement could not be confirmed due to current cardiac rhythm.   Micro Results    Recent Results (from the past 240 hour(s))  Culture, blood (Routine x 2)     Status: None (Preliminary result)   Collection Time: 03/25/18  2:50 PM  Result Value Ref Range Status   Specimen Description SITE NOT SPECIFIED  Final   Special Requests   Final    BOTTLES DRAWN AEROBIC ONLY Blood Culture adequate volume   Culture  Final    NO GROWTH 4 DAYS Performed at [facility], [place] [address] [place], [place] [address]    Report Status PENDING  Incomplete  Culture, blood (Routine x 2)     Status: None (Preliminary result)   Collection Time: 03/25/18  4:24 PM  Result Value Ref Range Status   Specimen Description BLOOD RIGHT FOREARM  Final   Special Requests   Final    BOTTLES DRAWN AEROBIC AND ANAEROBIC Blood Culture adequate volume   Culture   Final    NO GROWTH 4 DAYS Performed at [facility], [place] [address], [place], [place] [address]    Report Status  PENDING  Incomplete  Aerobic Culture (superficial specimen) w precautions panel     Status: None (Preliminary result)   Collection Time: 03/26/18  5:32 PM  Result Value Ref Range Status   Specimen Description FOOT LEFT  Final   Special Requests NONE  Final   Gram Stain   Final    FEW WBC PRESENT, PREDOMINANTLY PMN FEW GRAM POSITIVE COCCI FEW GRAM POSITIVE RODS RARE GRAM NEGATIVE RODS    Culture   Final    CULTURE REINCUBATED FOR BETTER GROWTH Performed at [facility], [place] [address] [place], [place] [address]    Report Status PENDING  Incomplete  Aerobic/Anaerobic Culture (surgical/deep wound)     Status: None (Preliminary result)   Collection Time: 03/27/18  8:19 AM  Result Value Ref Range Status   Specimen Description ABSCESS FOOT LEFT  Final   Special Requests A  Final   Gram Stain NO WBC SEEN NO ORGANISMS SEEN   Final   Culture   Final    CULTURE REINCUBATED FOR BETTER GROWTH HOLDING FOR POSSIBLE ANAEROBE Performed at [facility], [address], [place], [place] [address]    Report Status PENDING  Incomplete  Aerobic/Anaerobic Culture (surgical/deep wound)     Status: None (Preliminary result)   Collection Time: 03/27/18  8:22 AM  Result Value Ref Range Status   Specimen Description BONE LEFT METATARSAL  Final   Special Requests 1  Final   Gram Stain NO WBC SEEN NO ORGANISMS SEEN   Final   Culture   Final    RARE GROUP B STREP(S.AGALACTIAE)ISOLATED TESTING AGAINST S. AGALACTIAE NOT ROUTINELY PERFORMED DUE TO PREDICTABILITY OF AMP/PEN/VAN SUSCEPTIBILITY. NO ANAEROBES ISOLATED; CULTURE IN PROGRESS FOR 5 DAYS CRITICAL RESULT CALLED TO, READ BACK BY AND VERIFIED WITH: Jefferson Fuel, RN AT 4132 ON 03/29/18 ABOUT CULTURE GROWTH BY C. JESSUP, MLT. Performed at [facility], [place] [address] [place], [place] [address]    Report Status PENDING  Incomplete  MRSA PCR Screening     Status: None   Collection Time: 03/29/18  4:15 PM  Result Value Ref Range  Status   MRSA by PCR NEGATIVE NEGATIVE Final    Comment:        The GeneXpert MRSA Assay (FDA approved for NASAL specimens only), is one component of a comprehensive MRSA colonization surveillance program. It is not intended to diagnose MRSA infection nor to guide or monitor treatment for MRSA infections. Performed at [facility], [place] [address], [place], [place] [address]     Today   Subjective    [name] today has no headache,no chest abdominal pain,no new weakness tingling or numbness, feels much better wants to go home today.    Objective   Blood pressure 139/86, pulse 65, temperature 98.8 F (  37.1 C), temperature source Oral, resp. rate 12, height '6\' 4"'$  (1.93 m), weight 111.1 kg, SpO2 95 %.   Intake/Output Summary (Last 24 hours) at 03/30/2018 1101 Last data filed at 03/30/2018 1011 Gross per 24 hour  Intake 1604 ml  Output 450 ml  Net 1154 ml    Exam Awake Alert, Oriented x 3, No new F.N deficits, Normal affect [place].AT,PERRAL Supple Neck,No JVD, No cervical lymphadenopathy appriciated.  Symmetrical Chest wall movement, Good air movement bilaterally, CTAB RRR,No Gallops,Rubs or new Murmurs, No Parasternal Heave +ve B.Sounds, Abd Soft, Non tender, No organomegaly appriciated, No rebound -guarding or rigidity. No Cyanosis, Clubbing or edema, No new Rash or bruise   Data Review   CBC w Diff:  Lab Results  Component Value Date   WBC 8.1 03/28/2018   HGB 11.9 (L) 03/28/2018   HCT 37.5 (L) 03/28/2018   PLT 282 03/28/2018   LYMPHOPCT 13 03/25/2018   MONOPCT 7 03/25/2018   EOSPCT 2 03/25/2018   BASOPCT 1 03/25/2018    CMP:  Lab Results  Component Value Date   NA 138 03/28/2018   K 3.9 03/28/2018   CL 100 03/28/2018   CO2 28 03/28/2018   BUN 8 03/28/2018   CREATININE 1.06 03/28/2018   PROT 7.3 03/25/2018   ALBUMIN 3.6 03/25/2018   BILITOT 1.0 03/25/2018   ALKPHOS 90 03/25/2018   AST 16 03/25/2018   ALT 17 03/25/2018  .   Total  Time in preparing paper work, data evaluation and todays exam - 51 minutes  Lala Lund M.D on 03/30/2018 at [place]  [phone]

## 2018-03-30 NOTE — Progress Notes (Signed)
[name] discharged Home per MD order.  Discharge instructions reviewed and discussed with the patient, all questions and concerns answered. Copy of instructions and care notes for new medication and diagnosis given to patient.  Allergies as of 03/30/2018      Reactions   Lithium Other (See Comments)   "Made me feel like I was walking in chest-deep water. I didn't like it."   Adderall [amphetamine-dextroamphetamine] Palpitations, Other (See Comments)   Hyperactivity, also   Ritalin [methylphenidate] Palpitations, Other (See Comments)   Hyperactivity, also      Medication List    STOP taking these medications   amoxicillin-clavulanate 875-125 MG tablet Commonly known as:  AUGMENTIN   mupirocin ointment 2 % Commonly known as:  BACTROBAN     TAKE these medications   aspirin 81 MG tablet Take 1 tablet (81 mg total) by mouth daily.   calcium carbonate 500 MG chewable tablet Commonly known as:  TUMS - dosed in mg elemental calcium Chew 1 tablet by mouth as needed for indigestion or heartburn.   DULoxetine 60 MG capsule Commonly known as:  CYMBALTA Take 60 mg by mouth daily.   ertapenem  IVPB Commonly known as:  INVANZ Inject 1 g into the vein daily. Indication:  Osteomyelitis Last Day of Therapy:  05/10/18 Labs - Once weekly:  CBC/D and BMP, Labs - Every other week:  ESR and CRP   EVEKEO 10 MG Tabs Generic drug:  Amphetamine Sulfate Take 20 mg by mouth 2 (two) times daily.   fenofibrate 160 MG tablet Take 160 mg by mouth daily.   fluconazole 100 MG tablet Commonly known as:  DIFLUCAN Take 2 tablets by mouth on day 1 then take 1 tablet daily until gone   FREESTYLE LIBRE [place] every 14 (fourteen) days.   gabapentin 800 MG tablet Commonly known as:  NEURONTIN Take 800 mg by mouth 3 (three) times daily.   insulin NPH-regular Human (70-30) 100 UNIT/ML injection Commonly known as:  NOVOLIN 70/30 Insulin NPH 40 units in am and 30 Units at night  time.   LANTUS SOLOSTAR 100 UNIT/ML Solostar Pen Generic drug:  Insulin Glargine Inject 70 Units into the skin at bedtime.   NOVOLOG FLEXPEN 100 UNIT/ML FlexPen Generic drug:  insulin aspart Inject 22-32 Units into the skin See admin instructions. Inject 22-32 units into the skin three to four times a day before meals, per sliding scale   pantoprazole 40 MG tablet Commonly known as:  PROTONIX Take 1 tablet (40 mg total) by mouth daily before breakfast.   zolpidem 10 MG tablet Commonly known as:  AMBIEN Take 10 mg by mouth at bedtime as needed for sleep.            Home Infusion Instuctions  (From admission, onward)         Start     Ordered   03/30/18 0000  Home infusion instructions [facility] May follow [place] Dosing Protocol; May administer Cathflo as needed to maintain patency of vascular access device.; Flushing of vascular access device: per [facility] Protocol: 0.9% NaCl pre/post medica...    Question Answer Comment  Instructions May follow [place] Dosing Protocol   Instructions May administer Cathflo as needed to maintain patency of vascular access device.   Instructions Flushing of vascular access device: per [facility] Protocol: 0.9% NaCl pre/post medication administration and prn patency; Heparin 100 u/ml, 33m for implanted ports and Heparin 10u/ml, 538mfor all other central venous catheters.  Instructions May follow [facility] Anaphylaxis Protocol for First Dose Administration in the home: 0.9% NaCl at 25-50 ml/hr to maintain IV access for protocol meds. Epinephrine 0.3 ml IV/IM PRN and Benadryl 25-50 IV/IM PRN s/s of anaphylaxis.   Instructions [facility] Infusion Coordinator (RN) to assist per patient IV care needs in the home PRN.      03/30/18 0840           Durable Medical Equipment  (From admission, onward)         Start     Ordered   03/30/18 1041  For home use only DME Walker rolling  Once    Question:  Patient needs a walker to treat with  the following condition  Answer:  Left foot infection   03/30/18 1045          Patients skin is clean, dry and intact, no evidence of skin break down.   Patient escorted to car by NT/volunteer in a wheelchair,  no distress noted upon discharge.  Kenyatte Gruber C 03/30/2018 1:28 PM

## 2018-03-30 NOTE — Progress Notes (Signed)
PHARMACY CONSULT NOTE FOR:  OUTPATIENT  PARENTERAL ANTIBIOTIC THERAPY (OPAT)  Indication: Osteomyelitis Regimen: Ertapenem 1gm IV q24h End date: 05/10/18  IV antibiotic discharge orders are pended. To discharging provider:  please sign these orders via discharge navigator,  Select New Orders & click on the button choice - Manage This Unsigned Work.     Kamill Fulbright A. Jeanella Craze, PharmD, BCPS Clinical Pharmacist [place] Pager: [phone] Please utilize Amion for appropriate phone number to reach the unit pharmacist [facility])   03/30/2018, 8:33 AM

## 2018-03-30 NOTE — Progress Notes (Signed)
Subjective: POD #3 s/p left foot incision and drainage from the wound excision, bone biopsy.  He states that overall he is feeling well.  He is ready to get out of the hospital and go home. Denies any fevers, chills, nausea, vomiting.  She denies any calf pain, chest pain, shortness of breath.  Objective: AAO x3, NAD DP/PT pulses palpable bilaterally, CRT less than 3 seconds Wound present left foot submetatarsal 4.  Wound continues to probe along the third and fourth interspaces.  There is significantly improved edema erythema to the dorsal aspect the foot compared to yesterday.  There is still some localized erythema erythema more along the interspaces and the fourth and fifth toe but overall is doing much better.  There is no fluctuation or crepitation.  There is no purulence coming from the wound there is no drainable abscess identified.  There is no malodor.  Clinically he also states that this is the least swollen his foot is been in over a month. Superficial wound posterior heel without signs of infection  No pain with calf compression, swelling, warmth, erythema  Assessment: Postop day #3 status post left foot incision and drainage, wound debridement, bone biopsy- with improvement  Plan: -All treatment options discussed with the patient including all alternatives, risks, complications.  -I reviewed the bone cultures with him.  He is currently bacteria.  We discussed with him at this point for the surgery to resect the fourth ray.  He wants to hold off any further surgery and try antibiotic.  Discussed pros and cons of limb salvage versus amputation.  He understands that he is still at high risk of irritation further surgery but we are to continue with 6 weeks of IV antibiotics and treat her osteomyelitis and keep a close eye on this.  Clinically he is much improved and I think he is able to be discharged and I will keep a close eye on his an outpatient. -He is to have daily dressing changes.   Flush the wound with saline packed with iodoform packing covered with a dry sterile dressing and Ace bandage.  I encouraged elevation at all times and limit activity. -He would like a knee scooter.   Ovid Curd, DPM O: [phone] C: [phone]

## 2018-03-30 NOTE — Discharge Instructions (Signed)
Follow with Primary MD Ananias Pilgrim, MD in 7 days   Get CBC, BMP checked  by Primary MD  in 5-7 days   Activity: As tolerated with Full fall precautions use walker/cane & assistance as needed, wear the cam boot in the affected leg at all times when out of the bed  Disposition Home    Diet: Heart Healthy Low Carb, check CBGs QA CHS  Special Instructions: If you have smoked or chewed Tobacco  in the last 2 yrs please stop smoking, stop any regular Alcohol  and or any Recreational drug use.  On your next visit with your primary care physician please Get Medicines reviewed and adjusted.  Please request your Prim.MD to go over all Hospital Tests and Procedure/Radiological results at the follow up, please get all Hospital records sent to your Prim MD by signing hospital release before you go home.  If you experience worsening of your admission symptoms, develop shortness of breath, life threatening emergency, suicidal or homicidal thoughts you must seek medical attention immediately by calling 911 or calling your MD immediately  if symptoms less severe.  You Must read complete instructions/literature along with all the possible adverse reactions/side effects for all the Medicines you take and that have been prescribed to you. Take any new Medicines after you have completely understood and accpet all the possible adverse reactions/side effects.

## 2018-03-30 NOTE — Progress Notes (Signed)
Orthopedic Tech Progress Note Patient Details:  [name] June 12, 1965 [id number]  Ortho Devices Type of Ortho Device: Crutches Ortho Device/Splint Location: lle Ortho Device/Splint Interventions: Adjustment   Post Interventions Patient Tolerated: Well Instructions Provided: Adjustment of device   Norva Karvonen T 03/30/2018, 11:31 AM

## 2018-03-31 ENCOUNTER — Ambulatory Visit (INDEPENDENT_AMBULATORY_CARE_PROVIDER_SITE_OTHER): Payer: BLUE CROSS/BLUE SHIELD

## 2018-03-31 ENCOUNTER — Telehealth: Payer: Self-pay | Admitting: Podiatry

## 2018-03-31 DIAGNOSIS — L97522 Non-pressure chronic ulcer of other part of left foot with fat layer exposed: Secondary | ICD-10-CM

## 2018-03-31 NOTE — Telephone Encounter (Signed)
Patty - [facility] states pt was seen in our office today and they are looking for updated wound care order and HHC orders.

## 2018-03-31 NOTE — Telephone Encounter (Signed)
This is [name] with [facility]. Calling to get home care nursing orders for IV therapy, wound care, and weekly labs. My number is [phone].

## 2018-03-31 NOTE — Telephone Encounter (Signed)
Indication: Osteomyelitis Regimen: Ertapenem 1gm IV q24h End date: 05/10/18  There are more orders detailed in his discharge in regards to the PICC line.   Weekly CBC with differential, BMP, ESR, CRP. [facility] to manage but also send me labs.   In regards to the wound it needs to be flushed with saline, pack with 1/2 inch iodoform packing, cover with 4 x 4, Kerlix, ACE. This needs to be done daily.   I am not sure why this is not done. I was told that this was set up when he got discharged.

## 2018-04-01 ENCOUNTER — Encounter: Payer: Self-pay | Admitting: Podiatry

## 2018-04-01 ENCOUNTER — Encounter

## 2018-04-01 ENCOUNTER — Ambulatory Visit (INDEPENDENT_AMBULATORY_CARE_PROVIDER_SITE_OTHER): Payer: BLUE CROSS/BLUE SHIELD

## 2018-04-01 ENCOUNTER — Ambulatory Visit (INDEPENDENT_AMBULATORY_CARE_PROVIDER_SITE_OTHER): Payer: Self-pay | Admitting: Podiatry

## 2018-04-01 DIAGNOSIS — L97522 Non-pressure chronic ulcer of other part of left foot with fat layer exposed: Secondary | ICD-10-CM

## 2018-04-01 DIAGNOSIS — Z9889 Other specified postprocedural states: Secondary | ICD-10-CM

## 2018-04-01 DIAGNOSIS — L03116 Cellulitis of left lower limb: Secondary | ICD-10-CM

## 2018-04-01 LAB — AEROBIC/ANAEROBIC CULTURE (SURGICAL/DEEP WOUND)
GRAM STAIN: NONE SEEN
SPECIAL REQUESTS: 1

## 2018-04-01 LAB — AEROBIC/ANAEROBIC CULTURE W GRAM STAIN (SURGICAL/DEEP WOUND)

## 2018-04-01 NOTE — Telephone Encounter (Signed)
Left message informing Patti - [facility] of Dr. Gabriel Rung orders and that I would fax to their central office. Faxed orders to [facility].

## 2018-04-01 NOTE — Telephone Encounter (Signed)
I called Debbie - [facility] states pt's DOB is different from their orders, and I reviewed my orders and gave 10/101/1967 as correction.

## 2018-04-01 NOTE — Telephone Encounter (Signed)
This is Debbie with [facility]. I just received an order from Dr. Ardelle Anton and I'm calling to confirm the date of birth on the pt. What was on the order is different than what we have. If you can call me back at [phone].

## 2018-04-02 LAB — AEROBIC/ANAEROBIC CULTURE W GRAM STAIN (SURGICAL/DEEP WOUND)
Culture: NORMAL
Gram Stain: NONE SEEN

## 2018-04-02 LAB — AEROBIC/ANAEROBIC CULTURE (SURGICAL/DEEP WOUND)

## 2018-04-02 NOTE — Progress Notes (Addendum)
Patient is here today for follow-up appointment, status post left foot I&D performed on 03/25/2018.  He states that he is feeling good denies fever chills nausea vomiting, and that his foot is just a little sore today.  States he received his first round of antibiotics and his PICC line this morning.  Wound present left foot sub-fourth metatarsal.  There is no redness around the wound, no erythema, there is a small amount of drainage to the wound.  No purulence, no malodor.  Patient is afebrile, temperature is 98.2.  Iodosorb was packed into the wound, and covered with dry sterile bandage.  Patient is to keep his follow-up appointment tomorrow with Dr. Ardelle Anton.  Wedge shoe was dispensed with instructions on its use, patient is to remain in shoe at all times.

## 2018-04-05 ENCOUNTER — Encounter (HOSPITAL_COMMUNITY): Payer: Self-pay | Admitting: Podiatry

## 2018-04-05 NOTE — Progress Notes (Signed)
Subjective: [name] is a 52 y.o. is seen today in office s/p Left foot I&D, wound debridement, bone biopsy  preformed on 03/27/2018.  He states that his pain is minimal.  The swelling the redness is much improved.  He is on antibiotics through PICC line.  Denies any systemic complaints such as fevers, chills, nausea, vomiting. No calf pain, chest pain, shortness of breath.   Objective: General: No acute distress, AAOx3  DP/PT pulses palpable 2/4, CRT < 3 sec to all digits.  Protective sensation decreased LEFT foot: Ulceration submetatarsal 4.  There is significant improvement in edema erythema to the dorsal aspect but there is no ulceration or crepitation.  There is no drainage or pus there is no ascending cellulitis.  There is no malodor.  The wound is probed on the fourth interspace but overall is doing much better.  No other areas of tenderness to bilateral lower extremities.  No other open lesions or pre-ulcerative lesions.  No pain with calf compression, swelling, warmth, erythema.   Assessment and Plan:  Status post left foot surgery, doing well with no complications   -Treatment options discussed including all alternatives, risks, and complications -X-rays were obtained reviewed.  On the oblique view able to identify a radiolucent to the metatarsal head.  This could be from the bone biopsy. -Will continue with local wound care daily. The wound was cleaned with saline and was repacked with iodoform packing followed by dry sterile dressing. -Elevateion -Pain medication as needed. -Monitor for any clinical signs or symptoms of infection and DVT/PE and directed to call the office immediately should any occur or go to the ER. -Follow-up in 1 week or sooner if any problems arise. In the meantime, encouraged to call the office with any questions, concerns, change in symptoms.   Ovid Curd, DPM

## 2018-04-05 NOTE — Addendum Note (Signed)
Addendum  created 04/05/18 1005 by Shelton Silvas, MD   Sign clinical note, SmartForm saved

## 2018-04-08 ENCOUNTER — Telehealth: Payer: Self-pay | Admitting: *Deleted

## 2018-04-08 ENCOUNTER — Ambulatory Visit (INDEPENDENT_AMBULATORY_CARE_PROVIDER_SITE_OTHER): Payer: BLUE CROSS/BLUE SHIELD

## 2018-04-08 ENCOUNTER — Ambulatory Visit (INDEPENDENT_AMBULATORY_CARE_PROVIDER_SITE_OTHER): Payer: BLUE CROSS/BLUE SHIELD | Admitting: Podiatry

## 2018-04-08 DIAGNOSIS — L97524 Non-pressure chronic ulcer of other part of left foot with necrosis of bone: Secondary | ICD-10-CM | POA: Diagnosis not present

## 2018-04-08 DIAGNOSIS — L97522 Non-pressure chronic ulcer of other part of left foot with fat layer exposed: Secondary | ICD-10-CM | POA: Diagnosis not present

## 2018-04-08 DIAGNOSIS — M25572 Pain in left ankle and joints of left foot: Secondary | ICD-10-CM

## 2018-04-08 DIAGNOSIS — R609 Edema, unspecified: Secondary | ICD-10-CM

## 2018-04-08 DIAGNOSIS — L03116 Cellulitis of left lower limb: Secondary | ICD-10-CM

## 2018-04-08 DIAGNOSIS — Z9889 Other specified postprocedural states: Secondary | ICD-10-CM

## 2018-04-08 NOTE — Telephone Encounter (Signed)
Faxed required form, clinicals and demographics to [facility]. 

## 2018-04-08 NOTE — Telephone Encounter (Signed)
-----   Message from Vivi Barrack, DPM sent at 04/07/2018  5:49 PM EST ----- Can you please put in for an infectious disease consult. He has a PICC line and post hospital follow up. Thanks.

## 2018-04-11 NOTE — Progress Notes (Signed)
Subjective: [name] is a 52 y.o. is seen today in office s/p Left foot I&D, wound debridement, bone biopsy  preformed on 03/27/2018.  He states he is not having any pain.  He can continue the right shoe.  Has been continue the antibiotics that the PICC line.   He is currently getting ertapenem through his PICC line daily with an end date of 05/10/2018. He is to have weekly CBC and BMP and every other week a ESR and CRP.  He denies any fevers, chills, nausea, vomiting.  No calf pain, chest pain, shortness of breath.  Objective: General: No acute distress, AAOx3  DP/PT pulses palpable 2/4, CRT < 3 sec to all digits.  Protective sensation decreased LEFT foot: Ulceration submetatarsal 4.  The wound does probe to the fourth metatarsal head today.  There is no probing proximally there is no purulence identified.  The wound base is granular.  Unable to visualize the flexor tendon.  Some fibrotic wound base is present prior to debridement.  There is minimal swelling to the foot and there is no significant increase in warmth.  Significantly improved cellulitis. No other open lesions or pre-ulcerative lesions.  No pain with calf compression, swelling, warmth, erythema.    Assessment and Plan:  Status post left foot surgery  -Treatment options discussed including all alternatives, risks, and complications -X-rays were obtained reviewed.  On the oblique view able to identify a radiolucent to the metatarsal head.  This could be from the bone biopsy.  I do not think this is progressed since the last x-ray.  Radiolucency but this is from the wound.  X-rays performed after debridement of the wound. -The wound was sharply debrided with gauze #312 with scalpel as well as a tissue nipper to remove all nonviable tissue down to healthy, granular tissue.  The wound does go down to the metatarsal head but the bone appeared to be hard in nature.  No signs of an abscess identified today.  The wound was cleaned  and irrigated with saline and was repacked with iodoform packing.  Continue daily dressing changes.  Continue to recommend nonweightbearing with a Darco wedge shoe to order to avoid the pressure.  Continue elevation at all times. -Infectious disease follow-up.  He was called today for an appointment and to call back to schedule this. -Ultimately discussed fourth metatarsal had a possible amputation of the toe but for now it seems to be stable we will continue to monitor the wound. -I am also going to do a wound care consult. -Monitor for any clinical signs or symptoms of infection and directed to call the office immediately should any occur or go to the ER.  Trula Slade DPM

## 2018-04-12 ENCOUNTER — Telehealth: Payer: Self-pay | Admitting: *Deleted

## 2018-04-12 NOTE — Telephone Encounter (Signed)
[name] - [facility] transferred to the [facility] pharmacy, left message for labs to be faxed to our office.

## 2018-04-12 NOTE — Telephone Encounter (Signed)
Pt called and I informed pt of Dr. Ardelle Anton additional referral to [facility] and to still make an appt with Infectious Disease. Pt states understanding.

## 2018-04-12 NOTE — Telephone Encounter (Signed)
I called [facility] - Gloriajean Dell and told her the labs had not been received and she states she will fax to our [phone].

## 2018-04-12 NOTE — Telephone Encounter (Signed)
Left message for pt to call for information. 

## 2018-04-12 NOTE — Telephone Encounter (Signed)
Faxed required form, clinicals and demographics [facility]. 

## 2018-04-12 NOTE — Telephone Encounter (Signed)
-----   Message from Matthew R Wagoner, DPM sent at 04/11/2018 11:11 AM EST ----- I was thinking it may be best to have him see the wound care center as well for another opinion in regards to the wound. Can you please call him to let him know and put in a referral.   Also, we did the ID consult already and he stated on Thrusday he was called and he was going to call back to schedule. Can you ask him if he did this.  Also, can we get his lab work from [facility] (I think this is the agency). He has a PICC line and getting weekly labs. I am not sure who is looking at this and want to make sure everything is going well.   Thanks.  

## 2018-04-12 NOTE — Telephone Encounter (Signed)
Left message informing pt that due to the importance of the message I was going to leave it on the voicemail, and informed pt of Dr. Gabriel Rung referral to Wound Care and reminded pt to make an appt for Infectious Disease [phone].

## 2018-04-12 NOTE — Telephone Encounter (Deleted)
-----   Message from Vivi Barrack, DPM sent at 04/11/2018 11:11 AM EST ----- I was thinking it may be best to have him see the wound care center as well for another opinion in regards to the wound. Can you please call him to let him know and put in a referral.   Also, we did the ID consult already and he stated on Thrusday he was called and he was going to call back to schedule. Can you ask him if he did this.  Also, can we get his lab work from [facility] (I think this is the agency). He has a PICC line and getting weekly labs. I am not sure who is looking at this and want to make sure everything is going well.   Thanks.

## 2018-04-13 ENCOUNTER — Ambulatory Visit (INDEPENDENT_AMBULATORY_CARE_PROVIDER_SITE_OTHER): Payer: BLUE CROSS/BLUE SHIELD | Admitting: Podiatry

## 2018-04-13 DIAGNOSIS — L97522 Non-pressure chronic ulcer of other part of left foot with fat layer exposed: Secondary | ICD-10-CM

## 2018-04-13 NOTE — Telephone Encounter (Signed)
Renea Ee - [facility] faxed results to [phone]. Labs given to Dr. Ardelle Anton.

## 2018-04-14 NOTE — Progress Notes (Signed)
Subjective: [name] is a 52 y.o. is seen today in office s/p Left foot I&D, wound debridement, bone biopsy  preformed on 03/27/2018.  He presents today for dressing change. He states he is not having any pain. Has been continue the antibiotics that the PICC line.   He is currently getting ertapenem through his PICC line daily with an end date of 05/10/2018. He is to have weekly CBC and BMP and every other week a ESR and CRP.  He denies any fevers, chills, nausea, vomiting.  No calf pain, chest pain, shortness of breath.  Objective: General: No acute distress, AAOx3  DP/PT pulses palpable 2/4, CRT < 3 sec to all digits.  Protective sensation decreased LEFT foot: Ulceration submetatarsal 4.  Overall wound is clear to be improved.  Today measures 1.8 x 1 cm.  Still probes close to the metatarsal head overall it has started to fill in some.  There is no drainage or pus.  Upon trying to decompress the area is not able to get any purulence or signs of an abscess.  No swelling to the foot and  there is some minimal erythema but no significant increase in warmth.  No ascending cellulitis.  There is no fluctuation or crepitation or any malodor. No other open lesions or pre-ulcerative lesions.  No pain with calf compression, swelling, warmth, erythema.      Assessment and Plan:  Status post left foot surgery  -Treatment options discussed including all alternatives, risks, and complications -Overall the wound is improved compared to last week.  I sharply debrided the wound down to healthy, granular tissue utilizing #312 blade scalpel as well as a tissue nipper.  Wound was irrigated with saline was repacked today with iodoform packing.  Continue with daily dressing changes as well. -Overall his blood work looks good.  From the blood work dated April 05, 2018 his sed rate was 27, CRP was 6, white blood cell count 5.5, glucose 255.  His creatinine was 0.94 BUN 7. -Continue with offloading shoe  at all times. -Continue IV abx -Awaiting infectious disease as well as wound care consult. -Monitor for any clinical signs or symptoms of infection and directed to call the office immediately should any occur or go to the ER.  Trula Slade DPM

## 2018-04-15 ENCOUNTER — Telehealth: Payer: Self-pay | Admitting: *Deleted

## 2018-04-15 DIAGNOSIS — Z9889 Other specified postprocedural states: Secondary | ICD-10-CM

## 2018-04-15 DIAGNOSIS — L97522 Non-pressure chronic ulcer of other part of left foot with fat layer exposed: Secondary | ICD-10-CM

## 2018-04-15 DIAGNOSIS — L03116 Cellulitis of left lower limb: Secondary | ICD-10-CM

## 2018-04-15 NOTE — Telephone Encounter (Signed)
-----   Message from Vivi BarrackMatthew R Wagoner, DPM sent at 04/14/2018  3:50 PM EST ----- I know we put in the ID consult and he stated they called him to schedule and he was suppose to call back to set this up. I don't see an appointment. Can you call him to see if he has arranged this.   Also, was checking on the status of his wound care consult. Thanks.

## 2018-04-15 NOTE — Telephone Encounter (Signed)
I called [facility] - Destiny states they take faxed referrals and do not have pt's paperwork. I reviewed paperwork of 04/12/2018 and fax was confirmed received 04/12/2018. I told Destiny I would fax again. I called Destiny and she states she received the fax referral and will contact pt.

## 2018-04-19 ENCOUNTER — Telehealth: Payer: Self-pay | Admitting: Podiatry

## 2018-04-19 NOTE — Telephone Encounter (Signed)
Calling to notify us that patient has IV [name] and is out of town. She was unable  to change dressing or do blood draw this morning as scheduled.

## 2018-04-20 ENCOUNTER — Ambulatory Visit (INDEPENDENT_AMBULATORY_CARE_PROVIDER_SITE_OTHER): Payer: BLUE CROSS/BLUE SHIELD

## 2018-04-20 ENCOUNTER — Encounter: Payer: Self-pay | Admitting: Podiatry

## 2018-04-20 ENCOUNTER — Ambulatory Visit (INDEPENDENT_AMBULATORY_CARE_PROVIDER_SITE_OTHER): Payer: BLUE CROSS/BLUE SHIELD | Admitting: Podiatry

## 2018-04-20 DIAGNOSIS — L97522 Non-pressure chronic ulcer of other part of left foot with fat layer exposed: Secondary | ICD-10-CM | POA: Diagnosis not present

## 2018-04-20 DIAGNOSIS — L02619 Cutaneous abscess of unspecified foot: Secondary | ICD-10-CM

## 2018-04-20 DIAGNOSIS — L03119 Cellulitis of unspecified part of limb: Secondary | ICD-10-CM

## 2018-04-21 ENCOUNTER — Telehealth: Payer: Self-pay | Admitting: *Deleted

## 2018-04-21 NOTE — Telephone Encounter (Signed)
Left message for Dr, Ardelle AntonWagoner informing pt's labs were abnormal and sitting on my desk.

## 2018-04-22 ENCOUNTER — Encounter: Payer: Self-pay | Admitting: *Deleted

## 2018-04-22 NOTE — Progress Notes (Signed)
Subjective: [name] is a 52 y.o. is seen today in office s/p Left foot I&D, wound debridement, bone biopsy  preformed on 03/27/2018.  He presents today for dressing change.  He was out of town the bandage was not changed like it should have been.  They are coming today to do blood work.  Overall he states he had some mild increase in swelling and redness to his foot compared to my last saw him but denies any fevers, chills, nausea, vomiting.  No calf pain, chest pain, shortness of breath.   He is currently getting ertapenem through his PICC line daily with an end date of 05/10/2018. He is to have weekly CBC and BMP and every other week a ESR and CRP.  Objective: General: No acute distress, AAOx3  DP/PT pulses palpable 2/4, CRT < 3 sec to all digits.  Protective sensation decreased LEFT foot: Ulceration submetatarsal 4.  Today there is some fibrotic tissue within the wound.  There is small amount of purulence identified from the wound today.  Is able to probe it does probe down near the fourth metatarsal pad but after is able to clean the area there is no further purulence identified.  There is some mild increase in edema and erythema on the fourth MPJ compared to when I last saw him.  There is no fluctuation or crepitation or any malodor.  There is no ascending cellulitis. No other open lesions or pre-ulcerative lesions.  No pain with calf compression, swelling, warmth, erythema.   Assessment and Plan:  Status post left foot surgery  -Treatment options discussed including all alternatives, risks, and complications -X-rays were obtained reviewed.  On the oblique view there is cortical changes suggestive of osteomyelitis.  No soft tissue emphysema.  I discussed these findings with him.  He does not want to proceed with surgery.  Although I do think a fourth metatarsal head excision would be helpful for him.  I will keep a close eye on his him back next week.  I encouraged him to come in  sooner if needed. -I sharply debrided the wound down to healthy, granular tissue utilizing #312 blade scalpel as well as a tissue nipper.  There was purulence identified upon initial debridement and appear to be superficial and sent this for culture.  After debridement I was unable to get any further purulence.  Wound was irrigated with saline was repacked today with iodoform packing.  Continue with daily dressing changes as well.  I want to change the bandage twice a day. -He is scheduled for blood work today.  I asked him to have them fax it to me as well.  From the blood work dated April 05, 2018 his sed rate was 27, CRP was 6, white blood cell count 5.5, glucose 255.  His creatinine was 0.94 BUN 7. -Continue with offloading shoe at all times. -Continue IV abx as directed by ID.  He states that he appreciated the call back to schedule his appointment but strongly encouraged him to do so. -He also needs to follow-up with wound care center he has not called them back either.  I discussed that he needs to do this immediately! -Monitor for any clinical signs or symptoms of infection and directed to call the office immediately should any occur or go to the ER.  Trula Slade DPM

## 2018-04-22 NOTE — Telephone Encounter (Signed)
Faxed letter requesting advice concerning pt's kidney function and Invanz.

## 2018-04-22 NOTE — Telephone Encounter (Signed)
He is currently getting ertapenem through his PICC line daily with an end date of 05/10/2018.

## 2018-04-22 NOTE — Telephone Encounter (Signed)
I called [facility] - Nadine, transferred to their pharmacy, Coretta transferred to Amy, Pharmacist and she states at this point they do not have need to change the antibiotic, if needed may contact the Primary Care Physician DR. Alehegn Asres.

## 2018-04-23 ENCOUNTER — Telehealth: Payer: Self-pay | Admitting: Podiatry

## 2018-04-23 LAB — WOUND CULTURE
GRAM STAIN:: NONE SEEN
MICRO NUMBER:: 91392692
SPECIMEN QUALITY: ADEQUATE

## 2018-04-23 NOTE — Telephone Encounter (Signed)
Attempted to call patient again but no answer and unable to leave voicemail.

## 2018-04-23 NOTE — Telephone Encounter (Signed)
I attempted to call patient to see how he is doing and go over lab work. Went to [employer] and the voicemail is full and cannot leave a message.   We had contacted [facility] pharmacy and did not recommend re dosing the antibiotics.   Scheduled for follow-up Monday.

## 2018-04-26 ENCOUNTER — Ambulatory Visit (INDEPENDENT_AMBULATORY_CARE_PROVIDER_SITE_OTHER): Payer: BLUE CROSS/BLUE SHIELD | Admitting: Podiatry

## 2018-04-26 ENCOUNTER — Encounter: Payer: Self-pay | Admitting: Podiatry

## 2018-04-26 VITALS — Temp 97.1°F

## 2018-04-26 DIAGNOSIS — L97524 Non-pressure chronic ulcer of other part of left foot with necrosis of bone: Secondary | ICD-10-CM

## 2018-04-27 ENCOUNTER — Telehealth: Payer: Self-pay | Admitting: *Deleted

## 2018-04-27 NOTE — Telephone Encounter (Signed)
[name] - [facility] states she will fax 04/27/2018 blood work to [phone].

## 2018-04-27 NOTE — Progress Notes (Signed)
Subjective: [name] is a 52 y.o. is seen today in office s/p Left foot I&D, wound debridement, bone biopsy  preformed on 03/27/2018.  Since I last saw him he states that he has been changing the bandage twice a day and has not had any pus coming from the area and no increase in swelling or redness.  He is trying to have this is much as possible because walk some.  Is been wearing the Darco wedge shoe.  He denies any fevers, chills, nausea, vomiting.  No calf pain, chest pain, shortness of breath.  He has no other concerns today.  He is currently getting ertapenem through his PICC line daily with an end date of 05/10/2018. He is to have weekly CBC and BMP and every other week a ESR and CRP.  Objective: General: No acute distress, AAOx3  DP/PT pulses palpable 2/4, CRT < 3 sec to all digits.  Protective sensation decreased LEFT foot: Ulceration submetatarsal 4.  On the lateral portion of the wound does appear to be granulating in.  Still does probe deep venous probing close to bone.  There is some serosanguineous drainage expressed today but there is no purulence.  Mild edema and faint erythema but appears to be somewhat improved compared to last appointment.  There is no ascending cellulitis.  No fluctuation or crepitation or any malodor. No other open lesions or pre-ulcerative lesions.  No pain with calf compression, swelling, warmth, erythema.   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  Continue with twice a day dressing changes repacking with iodoform after cleaning with saline.  Continue with dressing overlying the area as well.  Continue to recommend nonweightbearing at least with a Darco wedge shoe to help take pressure off the area. -Ultimately I do think he is at least the fourth metatarsal head excision  but he wants to hold off on this and wants to wait until wound care evaluation next week.  Discussed the pros and cons of waiting for surgery.  He may be a good candidate for hyperbaric oxygen therapy if he declines surgery in combination with antibiotics.   Trula Slade DPM

## 2018-04-28 NOTE — Telephone Encounter (Signed)
Dr. Ardelle AntonWagoner states pt's Creatinine has improved, and the sed rate and CRP will be drawn next week. Left message for pt to call for lab results.

## 2018-04-28 NOTE — Telephone Encounter (Signed)
I spoke with [facility] and she states the soonest they could get pt in when he called was the 05/11/2018, and are currently booking out past 05/22/2018, and they do have him on the cancellation schedule and will call if an appt comes available.

## 2018-04-28 NOTE — Telephone Encounter (Signed)
Left message informing pt of [facility]'s appt and cancellation statement.

## 2018-04-28 NOTE — Telephone Encounter (Signed)
Pt called for results. I informed pt of Dr. Gabriel RungWagoner's review of the 04/27/2018 blood work. Pt states he has an appt 05/11/2018 [facility] and is on the cancellation schedule and wanted to know if we could get him in sooner. I told pt that often if Wound Care has put pt on the cancellation schedule that is the best they can do.

## 2018-05-03 ENCOUNTER — Encounter: Payer: Self-pay | Admitting: Podiatry

## 2018-05-03 ENCOUNTER — Ambulatory Visit (INDEPENDENT_AMBULATORY_CARE_PROVIDER_SITE_OTHER): Payer: BLUE CROSS/BLUE SHIELD | Admitting: Podiatry

## 2018-05-03 DIAGNOSIS — L03119 Cellulitis of unspecified part of limb: Secondary | ICD-10-CM

## 2018-05-03 DIAGNOSIS — L97524 Non-pressure chronic ulcer of other part of left foot with necrosis of bone: Secondary | ICD-10-CM | POA: Diagnosis not present

## 2018-05-03 DIAGNOSIS — L02619 Cutaneous abscess of unspecified foot: Secondary | ICD-10-CM

## 2018-05-04 ENCOUNTER — Encounter (HOSPITAL_BASED_OUTPATIENT_CLINIC_OR_DEPARTMENT_OTHER): Payer: BLUE CROSS/BLUE SHIELD | Attending: Internal Medicine

## 2018-05-04 DIAGNOSIS — E10621 Type 1 diabetes mellitus with foot ulcer: Secondary | ICD-10-CM | POA: Insufficient documentation

## 2018-05-04 DIAGNOSIS — E1042 Type 1 diabetes mellitus with diabetic polyneuropathy: Secondary | ICD-10-CM | POA: Insufficient documentation

## 2018-05-04 DIAGNOSIS — M86372 Chronic multifocal osteomyelitis, left ankle and foot: Secondary | ICD-10-CM | POA: Insufficient documentation

## 2018-05-04 DIAGNOSIS — E1069 Type 1 diabetes mellitus with other specified complication: Secondary | ICD-10-CM | POA: Insufficient documentation

## 2018-05-04 DIAGNOSIS — E1036 Type 1 diabetes mellitus with diabetic cataract: Secondary | ICD-10-CM | POA: Insufficient documentation

## 2018-05-04 DIAGNOSIS — L97426 Non-pressure chronic ulcer of left heel and midfoot with bone involvement without evidence of necrosis: Secondary | ICD-10-CM | POA: Diagnosis not present

## 2018-05-04 DIAGNOSIS — L97412 Non-pressure chronic ulcer of right heel and midfoot with fat layer exposed: Secondary | ICD-10-CM | POA: Insufficient documentation

## 2018-05-04 DIAGNOSIS — Z87891 Personal history of nicotine dependence: Secondary | ICD-10-CM | POA: Diagnosis not present

## 2018-05-04 DIAGNOSIS — Z794 Long term (current) use of insulin: Secondary | ICD-10-CM | POA: Diagnosis not present

## 2018-05-04 NOTE — Progress Notes (Addendum)
Subjective: [name] is a 52 y.o. is seen today in office s/p Left foot I&D, wound debridement, bone biopsy  preformed on 03/27/2018.  He has been changing the bandage daily.  He has been flushing with saline and packed with iodoform packing and a dry dressing.  He still notices some mild drainage coming from the area but is more clear denies any purulence.  Denies any increase in swelling or redness.  He has no pain.  He feels that the wound is healing.  He is still on a PICC line with antibiotics and is scheduled to see infectious disease on Wednesday.   He is currently getting ertapenem through his PICC line daily with an end date of 05/10/2018. He is to have weekly CBC and BMP and every other week a ESR and CRP.  Objective: General: No acute distress, AAOx3  DP/PT pulses palpable 2/4, CRT < 3 sec to all digits.  Protective sensation decreased LEFT foot: Ulceration submetatarsal 4.  Today that the wound does appear to be somewhat smaller and there is granulation tissue present within the wound.  Still probes down to the fourth metatarsal head.  There is some mild localized edema and erythema submetatarsal area but there is no fluctuation or crepitation.  There is no malodor.  No ascending cellulitis.  No purulent drainage today. No other open lesions or pre-ulcerative lesions.  No pain with calf compression, swelling, warmth, erythema.   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  There is some granulation tissue present within the wound which is the first time this is occurred.  I am still concerned about the localized edema erythema along the area.  Ultimately also concerned he may end up with at minimum fourth metatarsal head resection if not partial fourth ray amputation.  He wants to hold off on surgery.   He is any difficulty getting today.  We will monitor his ESR and CRP.  If is not improved would recommend to proceed with surgery. 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 Advised him to stay off of this foot! -Continue antibiotics as directed by Infectious Disease. He is still on the antibiotics from post hospital discharge- has appointment with ID this week. We have tried to get him in for some time now. They have called him to get an appointment but he states that he "forgets" to call back.   Trula Slade DPM

## 2018-05-05 ENCOUNTER — Telehealth: Payer: Self-pay

## 2018-05-05 ENCOUNTER — Encounter: Payer: Self-pay | Admitting: Internal Medicine

## 2018-05-05 ENCOUNTER — Ambulatory Visit (INDEPENDENT_AMBULATORY_CARE_PROVIDER_SITE_OTHER): Payer: BLUE CROSS/BLUE SHIELD | Admitting: Internal Medicine

## 2018-05-05 DIAGNOSIS — M86172 Other acute osteomyelitis, left ankle and foot: Secondary | ICD-10-CM

## 2018-05-05 NOTE — Telephone Encounter (Addendum)
Per verbal from Dr Luciana Axeomer order for patient IV medication faxed to [facility]. Ampicillin Sulbactam IV 3 Grams every 6 hours x 28 days. Medication will be delivered for start 05/06/18.

## 2018-05-05 NOTE — Telephone Encounter (Deleted)
Per Dr. Luciana Axeomer Faxed and received confirmation and verfied order to [facility] to have IV antibiotics changed to Ampicillin/sulbactam 2 gm every 6 hours or constant infusion and labs per home health for 28 days and Stop Ertapenen now.

## 2018-05-05 NOTE — Progress Notes (Signed)
[facility] for [facility]ious [facility]      Reason for Consult: osteomyelitis    Referring Physician: [name]    Patient ID: [name], [name]    DOB: [birth year], 52 y.o.   MRN: [id number]  HPI:   He is here for a new patient evaluation for osteomyelitis.   He was hospitalized in October with drainage for an abscess and underwent a bone biopsy and culture from this with several organisims including Group B Strep, Enterococcus avium (amp sensitive) and Enterococcus faecalis (amp sensitive) with Prevotella from abscess culture.  He was put on empiric ertapenem with plan to treat for 6 weeks through 12/9.  He has not been on ampicillin or amoxicillin.  No labs available to me at this time of recent CRP or ESR.   Previous record reviewed in Epic from recent hospitalization.  He has not previously been seen by ID.   Note from [name] reports some drainage and does still probe down to the metatarsal head. Plan for hyperbaric oxygen therapy via Dr. Dellia Nims this week.   Last Hgb A1C very high at 10.7.     Past Medical History:  Diagnosis Date  . ADHD (attention deficit hyperactivity disorder)   . Anxiety and depression   . Candida esophagitis ([place])    in setting of DKA/hyperglycemia  . CIDP (chronic inflammatory demyelinating polyneuropathy) ([place])   . Depression   . Diabetes type 2, controlled ([place])   . Diverticulosis   . Essential hypertension 06/21/2014  . Hyperlipidemia   . Kidney stones   . Pancreatitis   . Stomach ulcer   . Vitamin D deficiency     Prior to Admission medications   Medication Sig Start Date End Date Taking? Authorizing Provider  Amphetamine Sulfate (EVEKEO) 10 MG TABS Take 20 mg by mouth 2 (two) times daily.    Yes [provider]  aspirin 81 MG tablet Take 1 tablet (81 mg total) by mouth daily. 07/17/17  Yes Dana Allan I, MD  buPROPion (WELLBUTRIN XL) 150 MG 24 hr tablet  04/01/18  Yes [provider]  calcium carbonate  (TUMS - DOSED IN MG ELEMENTAL CALCIUM) 500 MG chewable tablet Chew 1 tablet by mouth as needed for indigestion or heartburn.   Yes [provider]  Continuous Blood Gluc Receiver (FREESTYLE LIBRE 14 DAY READER) DEVI every 14 (fourteen) days.  01/05/18  Yes [provider]  Continuous Blood Gluc Sensor (FREESTYLE LIBRE 14 DAY SENSOR) MISC  04/14/18  Yes [provider]  DULoxetine (CYMBALTA) 60 MG capsule Take 60 mg by mouth daily. 02/26/18  Yes [provider]  ertapenem (INVANZ) IVPB Inject 1 g into the vein daily. Indication:  Osteomyelitis Last Day of Therapy:  05/10/18 Labs - Once weekly:  CBC/D and BMP, Labs - Every other week:  ESR and CRP 03/30/18 05/10/18 Yes Thurnell Lose, MD  fenofibrate 160 MG tablet Take 160 mg by mouth daily.   Yes [provider]  fluconazole (DIFLUCAN) 100 MG tablet Take 2 tablets by mouth on day 1 then take 1 tablet daily until gone 07/22/17  Yes Gatha Mayer, MD  gabapentin (NEURONTIN) 800 MG tablet Take 800 mg by mouth 3 (three) times daily.   Yes [provider]  insulin aspart (NOVOLOG FLEXPEN) 100 UNIT/ML FlexPen Inject 22-32 Units into the skin See admin instructions. Inject 22-32 units into the skin three to four times a day before meals, per sliding scale   Yes [provider]  Insulin Glargine (LANTUS SOLOSTAR) 100 UNIT/ML Solostar Pen Inject 70 Units into the skin at bedtime.   Yes [provider]  insulin NPH-regular Human (NOVOLIN 70/30) (70-30) 100 UNIT/ML injection Insulin NPH 40 units in am and 30 Units at night time. 07/16/17  Yes Dana Allan I, MD  pantoprazole (PROTONIX) 40 MG tablet Take 1 tablet (40 mg total) by mouth daily before breakfast. 07/17/17  Yes Bonnell Public, MD  zolpidem (AMBIEN) 10 MG tablet Take 10 mg by mouth at bedtime as needed for sleep.    Yes [provider]    Allergies  Allergen Reactions  . Lithium Other (See Comments)    "Made me  feel like I was walking in chest-deep water. I didn't like it."  . Adderall [Amphetamine-Dextroamphetamine] Palpitations and Other (See Comments)    Hyperactivity, also  . Ritalin [Methylphenidate] Palpitations and Other (See Comments)    Hyperactivity, also    Social History   Tobacco Use  . Smoking status: Former Research scientist (life sciences)  . Smokeless tobacco: Former Network engineer Use Topics  . Alcohol use: Yes    Comment: Socially  . Drug use: No    Family History  Problem Relation Age of Onset  . Stroke Father   . Breast cancer Mother   . Colon cancer Neg Hx     Review of Systems  Constitutional: negative for fevers and chills Gastrointestinal: negative for diarrhea Musculoskeletal: negative for myalgias and arthralgias All other systems reviewed and are negative    Constitutional: in no apparent distress  Vitals:   05/05/18 0916  Weight: 250 lb (113.4 kg)   EYES: anicteric ENMT: no thrush Cardiovascular: Cor RRR Respiratory: CTA B; normal respiratory effort GI: soft, nt Musculoskeletal: no pedal edema noted Skin: open wound noted, no drainage, no surrounding erythema Hematologic: no cervical lad  Labs: Lab Results  Component Value Date   WBC 8.1 03/28/2018   HGB 11.9 (L) 03/28/2018   HCT 37.5 (L) 03/28/2018   MCV 88.2 03/28/2018   PLT 282 03/28/2018    Lab Results  Component Value Date   CREATININE 1.06 03/28/2018   BUN 8 03/28/2018   NA 138 03/28/2018   K 3.9 03/28/2018   CL 100 03/28/2018   CO2 28 03/28/2018    Lab Results  Component Value Date   ALT 17 03/25/2018   AST 16 03/25/2018   ALKPHOS 90 03/25/2018   BILITOT 1.0 03/25/2018   INR 1.01 03/25/2018     Assessment: osteomyelitis of 4th metatarsal.  Cultures noted from the bone biopsy and abscess from debridement.  Both with Enterococcus and these would not be treated with ertapenem.  I will change him to amp/sulbactam, likely via constant infusion, and treat for 3-4 more weeks.  This will also cover  other organisms.  I will stop the ertapenem.  Hopefully this will help his ulcer completely heal.    Plan: 1) amp sulbactam via [place] 2) labs per protocol rtc 2 weeks to monitor progress Not sure if this will be a constant infusion and may inhibit hyperbaric treatments.

## 2018-05-06 ENCOUNTER — Ambulatory Visit (HOSPITAL_COMMUNITY)
Admission: RE | Admit: 2018-05-06 | Discharge: 2018-05-06 | Disposition: A | Discharge status: Home / Self Care | Payer: BLUE CROSS/BLUE SHIELD | Source: Ambulatory Visit | Attending: Internal Medicine | Admitting: Internal Medicine

## 2018-05-06 ENCOUNTER — Other Ambulatory Visit (HOSPITAL_BASED_OUTPATIENT_CLINIC_OR_DEPARTMENT_OTHER): Payer: Self-pay | Admitting: Internal Medicine

## 2018-05-06 DIAGNOSIS — Z9289 Personal history of other medical treatment: Secondary | ICD-10-CM | POA: Diagnosis not present

## 2018-05-07 DIAGNOSIS — E10621 Type 1 diabetes mellitus with foot ulcer: Secondary | ICD-10-CM | POA: Diagnosis not present

## 2018-05-07 LAB — GLUCOSE, CAPILLARY: Glucose-Capillary: 76 mg/dL (ref 70–99)

## 2018-05-10 ENCOUNTER — Encounter: Payer: Self-pay | Admitting: Podiatry

## 2018-05-10 ENCOUNTER — Ambulatory Visit (INDEPENDENT_AMBULATORY_CARE_PROVIDER_SITE_OTHER): Payer: BLUE CROSS/BLUE SHIELD | Admitting: Podiatry

## 2018-05-10 VITALS — BP 150/86 | HR 83 | Temp 98.1°F | Resp 16

## 2018-05-10 DIAGNOSIS — L97524 Non-pressure chronic ulcer of other part of left foot with necrosis of bone: Secondary | ICD-10-CM

## 2018-05-11 DIAGNOSIS — E10621 Type 1 diabetes mellitus with foot ulcer: Secondary | ICD-10-CM | POA: Diagnosis not present

## 2018-05-11 LAB — GLUCOSE, CAPILLARY
Glucose-Capillary: 252 mg/dL — ABNORMAL HIGH (ref 70–99)
Glucose-Capillary: 205 mg/dL — ABNORMAL HIGH (ref 70–99)

## 2018-05-12 DIAGNOSIS — G629 Polyneuropathy, unspecified: Secondary | ICD-10-CM | POA: Insufficient documentation

## 2018-05-12 DIAGNOSIS — E10621 Type 1 diabetes mellitus with foot ulcer: Secondary | ICD-10-CM | POA: Diagnosis not present

## 2018-05-12 LAB — GLUCOSE, CAPILLARY
Glucose-Capillary: 152 mg/dL — ABNORMAL HIGH (ref 70–99)
Glucose-Capillary: 170 mg/dL — ABNORMAL HIGH (ref 70–99)

## 2018-05-13 ENCOUNTER — Telehealth: Payer: Self-pay | Admitting: *Deleted

## 2018-05-13 ENCOUNTER — Telehealth: Payer: Self-pay | Admitting: Podiatry

## 2018-05-13 DIAGNOSIS — E10621 Type 1 diabetes mellitus with foot ulcer: Secondary | ICD-10-CM | POA: Diagnosis not present

## 2018-05-13 LAB — GLUCOSE, CAPILLARY
GLUCOSE-CAPILLARY: 210 mg/dL — AB (ref 70–99)
Glucose-Capillary: 192 mg/dL — ABNORMAL HIGH (ref 70–99)

## 2018-05-13 NOTE — Telephone Encounter (Signed)
Left message to let [name] know that his blood work is much improved and is stable. Encouraged to call with any questions/concerns/changes.   Labs from 05/10/2018 WBC 8.8 Creatine 0.93 ESR 29 CRP 6

## 2018-05-13 NOTE — Progress Notes (Signed)
Subjective: Thereasa SoloSamuel B [name] is a 52 y.o. is seen today in office s/p Left foot I&D, wound debridement, bone biopsy  preformed on 03/27/2018.  Since I last saw him he did follow-up with Dr. Luciana Axeomer and the wound care center.  He is going to start hyperbaric oxygen therapy.  Also his antibiotics have been changed.  He feels that since I last saw him the wound is closed quite a bit and he is doing better.  He remains in the Darco wedge shoe.  Objective: General: No acute distress, AAOx3  DP/PT pulses palpable 2/4, CRT < 3 sec to all digits.  Protective sensation decreased LEFT foot: Ulceration submetatarsal 4. The wound does appear to be filling in.  It still probes but not as deep as it has been.  There is no purulence identified.  There is still some localized edema and erythema to the area but overall the foot appears to be better.  There is no fluctuation or crepitation or any malodor identified today. No other open lesions or pre-ulcerative lesions.  No pain with calf compression, swelling, warmth, erythema.   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  Continue with daily dressing changes as directed by the wound care center.  He is in a follow-up with him tomorrow he can have a total contact cast applied.  He is also restarted hyperbaric oxygen therapy.  He is on the appropriate antibiotics as directed by infectious disease at this point with Dr. Luciana Axeomer.  -Always refer treatment to the wound care center at this point in regards to the wound.  Strongly encouraged him to keep his follow-up appointments.  I will see him back in 1 month or sooner if any issues are to arise.  I will be more than happy to see him at any point if he needs.  Vivi BarrackMatthew R Raphel Stickles DPM

## 2018-05-13 NOTE — Telephone Encounter (Signed)
Lurena JoinerRebecca - [facility] faxed 05/11/2018 labs. Labs received and given to Dr. Ardelle AntonWagoner, he said he would contact pt.

## 2018-05-13 NOTE — Telephone Encounter (Signed)
-----   Message from Vivi BarrackMatthew R Wagoner, DPM sent at 05/13/2018 12:09 PM EST ----- Did we receive any labs or [facility] this week for him?

## 2018-05-14 DIAGNOSIS — E10621 Type 1 diabetes mellitus with foot ulcer: Secondary | ICD-10-CM | POA: Diagnosis not present

## 2018-05-14 LAB — GLUCOSE, CAPILLARY
Glucose-Capillary: 224 mg/dL — ABNORMAL HIGH (ref 70–99)
Glucose-Capillary: 249 mg/dL — ABNORMAL HIGH (ref 70–99)

## 2018-05-17 ENCOUNTER — Telehealth: Payer: Self-pay | Admitting: Behavioral Health

## 2018-05-17 DIAGNOSIS — E10621 Type 1 diabetes mellitus with foot ulcer: Secondary | ICD-10-CM | POA: Diagnosis not present

## 2018-05-17 LAB — GLUCOSE, CAPILLARY: Glucose-Capillary: 177 mg/dL — ABNORMAL HIGH (ref 70–99)

## 2018-05-17 NOTE — Telephone Encounter (Addendum)
Laverda [facility]Mary Geise from [facility] called stating patient has an appointment with Dr. Luciana Axeomer next week and wound care before that.  She states she will not be able to draw labs before his appointment Monday.  She was calling to see if labs could be drawn at the appointment. Angeline SlimAshley Leonides Minder RN

## 2018-05-18 DIAGNOSIS — E10621 Type 1 diabetes mellitus with foot ulcer: Secondary | ICD-10-CM | POA: Diagnosis not present

## 2018-05-18 LAB — GLUCOSE, CAPILLARY
Glucose-Capillary: 241 mg/dL — ABNORMAL HIGH (ref 70–99)
Glucose-Capillary: 153 mg/dL — ABNORMAL HIGH (ref 70–99)

## 2018-05-19 DIAGNOSIS — E10621 Type 1 diabetes mellitus with foot ulcer: Secondary | ICD-10-CM | POA: Diagnosis not present

## 2018-05-19 LAB — GLUCOSE, CAPILLARY
Glucose-Capillary: 151 mg/dL — ABNORMAL HIGH (ref 70–99)
Glucose-Capillary: 208 mg/dL — ABNORMAL HIGH (ref 70–99)

## 2018-05-20 DIAGNOSIS — E10621 Type 1 diabetes mellitus with foot ulcer: Secondary | ICD-10-CM | POA: Diagnosis not present

## 2018-05-20 LAB — GLUCOSE, CAPILLARY
Glucose-Capillary: 281 mg/dL — ABNORMAL HIGH (ref 70–99)
Glucose-Capillary: 280 mg/dL — ABNORMAL HIGH (ref 70–99)

## 2018-05-21 DIAGNOSIS — E10621 Type 1 diabetes mellitus with foot ulcer: Secondary | ICD-10-CM | POA: Diagnosis not present

## 2018-05-21 LAB — GLUCOSE, CAPILLARY
Glucose-Capillary: 176 mg/dL — ABNORMAL HIGH (ref 70–99)
Glucose-Capillary: 192 mg/dL — ABNORMAL HIGH (ref 70–99)
Glucose-Capillary: 224 mg/dL — ABNORMAL HIGH (ref 70–99)

## 2018-05-24 ENCOUNTER — Ambulatory Visit (INDEPENDENT_AMBULATORY_CARE_PROVIDER_SITE_OTHER): Payer: BLUE CROSS/BLUE SHIELD | Admitting: Internal Medicine

## 2018-05-24 ENCOUNTER — Encounter: Payer: Self-pay | Admitting: Internal Medicine

## 2018-05-24 DIAGNOSIS — M86172 Other acute osteomyelitis, left ankle and foot: Secondary | ICD-10-CM | POA: Diagnosis not present

## 2018-05-24 DIAGNOSIS — Z9119 Patient's noncompliance with other medical treatment and regimen: Secondary | ICD-10-CM | POA: Diagnosis not present

## 2018-05-24 DIAGNOSIS — Z452 Encounter for adjustment and management of vascular access device: Secondary | ICD-10-CM | POA: Diagnosis not present

## 2018-05-24 DIAGNOSIS — Z91199 Patient's noncompliance with other medical treatment and regimen due to unspecified reason: Secondary | ICD-10-CM

## 2018-05-24 DIAGNOSIS — E10621 Type 1 diabetes mellitus with foot ulcer: Secondary | ICD-10-CM | POA: Diagnosis not present

## 2018-05-24 LAB — GLUCOSE, CAPILLARY
Glucose-Capillary: 180 mg/dL — ABNORMAL HIGH (ref 70–99)
Glucose-Capillary: 179 mg/dL — ABNORMAL HIGH (ref 70–99)

## 2018-05-24 NOTE — Progress Notes (Signed)
   Subjective:    Patient ID: [name], male    DOB: [birth year], 52 y.o.   MRN: [id number]  HPI Here for follow up of osteomyelitis.   Has been on previous antibiotics for osteomyelitis but has been healing poorly.  Did have growth of Enterococcus on culture and I started amp/sulbactam 2 weeks ago.  Has also had poorly controlled DM with last A1C I see of over 10.  CRP and ESR normal or near normal.  Seems to be healing and covering over bone.  Getting hyperbaric treatment as well.    Review of Systems  Constitutional: Negative for chills and fever.  Gastrointestinal: Negative for diarrhea.  Skin: Negative for rash.       Objective:   Physical Exam Constitutional:      Appearance: Normal appearance.  Eyes:     General: No scleral icterus. Cardiovascular:     Rate and Rhythm: Normal rate and regular rhythm.  Pulmonary:     Effort: Pulmonary effort is normal.     Breath sounds: Normal breath sounds.  Musculoskeletal:     Comments: Deep wound and down about to bone but some possible improvement  Neurological:     Mental Status: He is alert.    SH: no tobacco now       Assessment & Plan:

## 2018-05-24 NOTE — Assessment & Plan Note (Signed)
Doing well with picc line, no issues.

## 2018-05-24 NOTE — Assessment & Plan Note (Signed)
Needs to continue to improve his DM treatment

## 2018-05-24 NOTE — Assessment & Plan Note (Signed)
Seems to be improving.  Will continue amp/sulbactam another 2 weeks and consider a total of 6 weeks if continued improvement then (4 more weeks from now) vs transition to Augmentin.

## 2018-05-27 ENCOUNTER — Telehealth: Payer: Self-pay | Admitting: Podiatry

## 2018-05-27 DIAGNOSIS — E10621 Type 1 diabetes mellitus with foot ulcer: Secondary | ICD-10-CM | POA: Diagnosis not present

## 2018-05-27 LAB — GLUCOSE, CAPILLARY
Glucose-Capillary: 248 mg/dL — ABNORMAL HIGH (ref 70–99)
Glucose-Capillary: 281 mg/dL — ABNORMAL HIGH (ref 70–99)

## 2018-05-27 NOTE — Telephone Encounter (Signed)
[facility] calling requesting orders to continue care past re-cert period for patient. Please give them a call back.

## 2018-05-27 NOTE — Telephone Encounter (Signed)
I informed Corrie DandyMary - [facility] Dr. Ardelle AntonWagoner had okayed the continuation of [facility].

## 2018-05-28 ENCOUNTER — Telehealth: Payer: Self-pay | Admitting: *Deleted

## 2018-05-28 DIAGNOSIS — E10621 Type 1 diabetes mellitus with foot ulcer: Secondary | ICD-10-CM | POA: Diagnosis not present

## 2018-05-28 LAB — GLUCOSE, CAPILLARY
Glucose-Capillary: 239 mg/dL — ABNORMAL HIGH (ref 70–99)
Glucose-Capillary: 229 mg/dL — ABNORMAL HIGH (ref 70–99)

## 2018-05-28 NOTE — Telephone Encounter (Signed)
Dr. Ardelle AntonWagoner reviewed blood work results of 05/25/2018 as within normal limits. Left message informing pt of Dr. Gabriel RungWagoner's review.

## 2018-05-31 DIAGNOSIS — E10621 Type 1 diabetes mellitus with foot ulcer: Secondary | ICD-10-CM | POA: Diagnosis not present

## 2018-05-31 LAB — GLUCOSE, CAPILLARY
Glucose-Capillary: 331 mg/dL — ABNORMAL HIGH (ref 70–99)
Glucose-Capillary: 303 mg/dL — ABNORMAL HIGH (ref 70–99)

## 2018-06-01 ENCOUNTER — Telehealth: Payer: Self-pay | Admitting: *Deleted

## 2018-06-01 DIAGNOSIS — E10621 Type 1 diabetes mellitus with foot ulcer: Secondary | ICD-10-CM | POA: Diagnosis not present

## 2018-06-01 LAB — GLUCOSE, CAPILLARY
Glucose-Capillary: 277 mg/dL — ABNORMAL HIGH (ref 70–99)
Glucose-Capillary: 203 mg/dL — ABNORMAL HIGH (ref 70–99)

## 2018-06-01 NOTE — Telephone Encounter (Signed)
[name] reviewed blood work of 05/31/2018 as ESR, CRP Creatinine as within normal limits. I left message informing pt of Dr. Leigh Aurora review of results.

## 2018-06-03 ENCOUNTER — Encounter (HOSPITAL_BASED_OUTPATIENT_CLINIC_OR_DEPARTMENT_OTHER): Payer: BLUE CROSS/BLUE SHIELD | Attending: Internal Medicine

## 2018-06-03 DIAGNOSIS — E10621 Type 1 diabetes mellitus with foot ulcer: Secondary | ICD-10-CM | POA: Insufficient documentation

## 2018-06-03 DIAGNOSIS — E1069 Type 1 diabetes mellitus with other specified complication: Secondary | ICD-10-CM | POA: Diagnosis not present

## 2018-06-03 DIAGNOSIS — L97524 Non-pressure chronic ulcer of other part of left foot with necrosis of bone: Secondary | ICD-10-CM | POA: Diagnosis not present

## 2018-06-03 DIAGNOSIS — M86372 Chronic multifocal osteomyelitis, left ankle and foot: Secondary | ICD-10-CM | POA: Insufficient documentation

## 2018-06-03 DIAGNOSIS — E1042 Type 1 diabetes mellitus with diabetic polyneuropathy: Secondary | ICD-10-CM | POA: Diagnosis not present

## 2018-06-03 LAB — GLUCOSE, CAPILLARY
Glucose-Capillary: 227 mg/dL — ABNORMAL HIGH (ref 70–99)
Glucose-Capillary: 176 mg/dL — ABNORMAL HIGH (ref 70–99)

## 2018-06-04 DIAGNOSIS — E10621 Type 1 diabetes mellitus with foot ulcer: Secondary | ICD-10-CM | POA: Diagnosis not present

## 2018-06-04 LAB — GLUCOSE, CAPILLARY
GLUCOSE-CAPILLARY: 81 mg/dL (ref 70–99)
Glucose-Capillary: 146 mg/dL — ABNORMAL HIGH (ref 70–99)
Glucose-Capillary: 93 mg/dL (ref 70–99)
Glucose-Capillary: 118 mg/dL — ABNORMAL HIGH (ref 70–99)

## 2018-06-07 ENCOUNTER — Ambulatory Visit: Payer: BLUE CROSS/BLUE SHIELD | Admitting: Podiatry

## 2018-06-07 DIAGNOSIS — E10621 Type 1 diabetes mellitus with foot ulcer: Secondary | ICD-10-CM | POA: Diagnosis not present

## 2018-06-07 LAB — GLUCOSE, CAPILLARY
GLUCOSE-CAPILLARY: 160 mg/dL — AB (ref 70–99)
Glucose-Capillary: 115 mg/dL — ABNORMAL HIGH (ref 70–99)

## 2018-06-08 ENCOUNTER — Telehealth: Payer: Self-pay | Admitting: *Deleted

## 2018-06-08 DIAGNOSIS — E10621 Type 1 diabetes mellitus with foot ulcer: Secondary | ICD-10-CM | POA: Diagnosis not present

## 2018-06-08 LAB — GLUCOSE, CAPILLARY
GLUCOSE-CAPILLARY: 155 mg/dL — AB (ref 70–99)
Glucose-Capillary: 230 mg/dL — ABNORMAL HIGH (ref 70–99)

## 2018-06-09 DIAGNOSIS — E10621 Type 1 diabetes mellitus with foot ulcer: Secondary | ICD-10-CM | POA: Diagnosis not present

## 2018-06-09 LAB — GLUCOSE, CAPILLARY
Glucose-Capillary: 103 mg/dL — ABNORMAL HIGH (ref 70–99)
Glucose-Capillary: 112 mg/dL — ABNORMAL HIGH (ref 70–99)
Glucose-Capillary: 183 mg/dL — ABNORMAL HIGH (ref 70–99)
Glucose-Capillary: 221 mg/dL — ABNORMAL HIGH (ref 70–99)

## 2018-06-09 NOTE — Telephone Encounter (Signed)
Stop antibiotics if we are unable to contact him and pull picc line. thanks

## 2018-06-09 NOTE — Telephone Encounter (Signed)
[name] from [facility] called for updated orders. Patient was supposed to be seen by Dr Luciana Axe for antibiotic therapy decision, but patient did not make follow up appointment. Per Dr Luciana Axe, keep IV antibiotics as ordered until he follows up.  Front desk attempted to contact patient x 2. [facility] aware, will tell patient to contact us if they are able to speak with him. Andree Coss, RN

## 2018-06-10 NOTE — Telephone Encounter (Signed)
RN was able to speak with patient. He states he is now in a cast, undergoing hyperbaric treatment 5 days a week, has 20 treatments left. He reports that the wound is better, mostly closed.  He has a high out of pocket expense with the new year, is wondering if it is necessary to follow up at [facility].  Per Dr Luciana Axe, ok for patient to continue to be seen at wound care and podiatry for the wound, as long as he is keeping those appointments. Dr Luciana Axe will continue the iv antibiotics for 2 more weeks, will have [facility] pull PICC at completion of therapy.  Patient grateful for this arrangement. RN relayed verbal orders to [employer] at [facility] pharmacy. Andree Coss, RN

## 2018-06-14 DIAGNOSIS — E10621 Type 1 diabetes mellitus with foot ulcer: Secondary | ICD-10-CM | POA: Diagnosis not present

## 2018-06-14 LAB — GLUCOSE, CAPILLARY
Glucose-Capillary: 100 mg/dL — ABNORMAL HIGH (ref 70–99)
Glucose-Capillary: 137 mg/dL — ABNORMAL HIGH (ref 70–99)
Glucose-Capillary: 154 mg/dL — ABNORMAL HIGH (ref 70–99)

## 2018-06-15 DIAGNOSIS — E10621 Type 1 diabetes mellitus with foot ulcer: Secondary | ICD-10-CM | POA: Diagnosis not present

## 2018-06-15 LAB — GLUCOSE, CAPILLARY
GLUCOSE-CAPILLARY: 147 mg/dL — AB (ref 70–99)
Glucose-Capillary: 199 mg/dL — ABNORMAL HIGH (ref 70–99)

## 2018-06-16 DIAGNOSIS — E10621 Type 1 diabetes mellitus with foot ulcer: Secondary | ICD-10-CM | POA: Diagnosis not present

## 2018-06-16 LAB — GLUCOSE, CAPILLARY
Glucose-Capillary: 255 mg/dL — ABNORMAL HIGH (ref 70–99)
Glucose-Capillary: 262 mg/dL — ABNORMAL HIGH (ref 70–99)

## 2018-06-17 DIAGNOSIS — E10621 Type 1 diabetes mellitus with foot ulcer: Secondary | ICD-10-CM | POA: Diagnosis not present

## 2018-06-17 LAB — GLUCOSE, CAPILLARY
Glucose-Capillary: 287 mg/dL — ABNORMAL HIGH (ref 70–99)
Glucose-Capillary: 238 mg/dL — ABNORMAL HIGH (ref 70–99)

## 2018-06-18 DIAGNOSIS — E10621 Type 1 diabetes mellitus with foot ulcer: Secondary | ICD-10-CM | POA: Diagnosis not present

## 2018-06-18 LAB — GLUCOSE, CAPILLARY
GLUCOSE-CAPILLARY: 188 mg/dL — AB (ref 70–99)
Glucose-Capillary: 187 mg/dL — ABNORMAL HIGH (ref 70–99)

## 2018-06-21 DIAGNOSIS — E10621 Type 1 diabetes mellitus with foot ulcer: Secondary | ICD-10-CM | POA: Diagnosis not present

## 2018-06-21 LAB — GLUCOSE, CAPILLARY
GLUCOSE-CAPILLARY: 41 mg/dL — AB (ref 70–99)
GLUCOSE-CAPILLARY: 105 mg/dL — AB (ref 70–99)
Glucose-Capillary: 217 mg/dL — ABNORMAL HIGH (ref 70–99)

## 2018-06-22 ENCOUNTER — Telehealth: Payer: Self-pay | Admitting: *Deleted

## 2018-06-22 DIAGNOSIS — E10621 Type 1 diabetes mellitus with foot ulcer: Secondary | ICD-10-CM | POA: Diagnosis not present

## 2018-06-22 LAB — GLUCOSE, CAPILLARY
GLUCOSE-CAPILLARY: 236 mg/dL — AB (ref 70–99)
Glucose-Capillary: 255 mg/dL — ABNORMAL HIGH (ref 70–99)

## 2018-06-22 NOTE — Telephone Encounter (Signed)
This needs to be sent to infectious disease.

## 2018-06-22 NOTE — Telephone Encounter (Signed)
[facility] - Corrie Dandy requested orders for 1 week visit to pull PICC line after completing the antibiotic this weekend, call or fax the order to [phone].

## 2018-06-23 ENCOUNTER — Telehealth: Payer: Self-pay | Admitting: Podiatry

## 2018-06-23 DIAGNOSIS — E10621 Type 1 diabetes mellitus with foot ulcer: Secondary | ICD-10-CM | POA: Diagnosis not present

## 2018-06-23 LAB — GLUCOSE, CAPILLARY
Glucose-Capillary: 262 mg/dL — ABNORMAL HIGH (ref 70–99)
Glucose-Capillary: 165 mg/dL — ABNORMAL HIGH (ref 70–99)

## 2018-06-23 NOTE — Telephone Encounter (Signed)
I informed [facility] - [facility] of Dr. Gabriel Rung directions to get orders concerning PICC line from [facility].

## 2018-06-23 NOTE — Telephone Encounter (Signed)
Terry with [facility] called to get an order for a PRN visit to discontinue the PICC line.

## 2018-06-23 NOTE — Telephone Encounter (Signed)
Orders from Dr. Ardelle AntonWagoner answered on 06/23/2018 2:35pm message.

## 2018-06-24 DIAGNOSIS — E10621 Type 1 diabetes mellitus with foot ulcer: Secondary | ICD-10-CM | POA: Diagnosis not present

## 2018-06-24 LAB — GLUCOSE, CAPILLARY
Glucose-Capillary: 222 mg/dL — ABNORMAL HIGH (ref 70–99)
Glucose-Capillary: 238 mg/dL — ABNORMAL HIGH (ref 70–99)

## 2018-06-25 ENCOUNTER — Encounter: Payer: Self-pay | Admitting: Podiatry

## 2018-06-25 DIAGNOSIS — E10621 Type 1 diabetes mellitus with foot ulcer: Secondary | ICD-10-CM | POA: Diagnosis not present

## 2018-06-25 LAB — GLUCOSE, CAPILLARY
Glucose-Capillary: 207 mg/dL — ABNORMAL HIGH (ref 70–99)
Glucose-Capillary: 198 mg/dL — ABNORMAL HIGH (ref 70–99)

## 2018-06-29 DIAGNOSIS — E10621 Type 1 diabetes mellitus with foot ulcer: Secondary | ICD-10-CM | POA: Diagnosis not present

## 2018-06-29 LAB — GLUCOSE, CAPILLARY
Glucose-Capillary: 294 mg/dL — ABNORMAL HIGH (ref 70–99)
Glucose-Capillary: 243 mg/dL — ABNORMAL HIGH (ref 70–99)

## 2018-06-30 DIAGNOSIS — E10621 Type 1 diabetes mellitus with foot ulcer: Secondary | ICD-10-CM | POA: Diagnosis not present

## 2018-06-30 LAB — GLUCOSE, CAPILLARY
Glucose-Capillary: 205 mg/dL — ABNORMAL HIGH (ref 70–99)
Glucose-Capillary: 215 mg/dL — ABNORMAL HIGH (ref 70–99)

## 2018-06-30 NOTE — Telephone Encounter (Signed)
Left message informing pt of Dr. Wagoner's review of results. 

## 2018-06-30 NOTE — Telephone Encounter (Signed)
-----   Message from Vivi Barrack, DPM sent at 06/29/2018  8:42 AM EST ----- Val- please let him know that his lab work is normal.

## 2018-07-01 DIAGNOSIS — E10621 Type 1 diabetes mellitus with foot ulcer: Secondary | ICD-10-CM | POA: Diagnosis not present

## 2018-07-01 LAB — GLUCOSE, CAPILLARY
Glucose-Capillary: 285 mg/dL — ABNORMAL HIGH (ref 70–99)
Glucose-Capillary: 264 mg/dL — ABNORMAL HIGH (ref 70–99)

## 2018-07-02 DIAGNOSIS — E10621 Type 1 diabetes mellitus with foot ulcer: Secondary | ICD-10-CM | POA: Diagnosis not present

## 2018-07-02 LAB — GLUCOSE, CAPILLARY
Glucose-Capillary: 193 mg/dL — ABNORMAL HIGH (ref 70–99)
Glucose-Capillary: 181 mg/dL — ABNORMAL HIGH (ref 70–99)

## 2018-07-05 ENCOUNTER — Encounter (HOSPITAL_BASED_OUTPATIENT_CLINIC_OR_DEPARTMENT_OTHER): Payer: BLUE CROSS/BLUE SHIELD | Attending: Internal Medicine

## 2018-07-05 DIAGNOSIS — E10621 Type 1 diabetes mellitus with foot ulcer: Secondary | ICD-10-CM | POA: Insufficient documentation

## 2018-07-05 DIAGNOSIS — E1069 Type 1 diabetes mellitus with other specified complication: Secondary | ICD-10-CM | POA: Diagnosis present

## 2018-07-05 DIAGNOSIS — L97524 Non-pressure chronic ulcer of other part of left foot with necrosis of bone: Secondary | ICD-10-CM | POA: Diagnosis not present

## 2018-07-05 DIAGNOSIS — G6289 Other specified polyneuropathies: Secondary | ICD-10-CM | POA: Insufficient documentation

## 2018-07-05 DIAGNOSIS — M86372 Chronic multifocal osteomyelitis, left ankle and foot: Secondary | ICD-10-CM | POA: Diagnosis not present

## 2018-07-06 DIAGNOSIS — E1069 Type 1 diabetes mellitus with other specified complication: Secondary | ICD-10-CM | POA: Diagnosis not present

## 2018-07-06 LAB — GLUCOSE, CAPILLARY
GLUCOSE-CAPILLARY: 135 mg/dL — AB (ref 70–99)
Glucose-Capillary: 177 mg/dL — ABNORMAL HIGH (ref 70–99)
Glucose-Capillary: 167 mg/dL — ABNORMAL HIGH (ref 70–99)
Glucose-Capillary: 129 mg/dL — ABNORMAL HIGH (ref 70–99)

## 2018-07-07 DIAGNOSIS — E1069 Type 1 diabetes mellitus with other specified complication: Secondary | ICD-10-CM | POA: Diagnosis not present

## 2018-07-07 LAB — GLUCOSE, CAPILLARY
Glucose-Capillary: 257 mg/dL — ABNORMAL HIGH (ref 70–99)
Glucose-Capillary: 212 mg/dL — ABNORMAL HIGH (ref 70–99)

## 2018-07-09 DIAGNOSIS — E1069 Type 1 diabetes mellitus with other specified complication: Secondary | ICD-10-CM | POA: Diagnosis not present

## 2018-07-09 LAB — GLUCOSE, CAPILLARY
Glucose-Capillary: 132 mg/dL — ABNORMAL HIGH (ref 70–99)
Glucose-Capillary: 161 mg/dL — ABNORMAL HIGH (ref 70–99)
Glucose-Capillary: 84 mg/dL (ref 70–99)

## 2018-07-12 DIAGNOSIS — E1069 Type 1 diabetes mellitus with other specified complication: Secondary | ICD-10-CM | POA: Diagnosis not present

## 2018-07-12 LAB — GLUCOSE, CAPILLARY
Glucose-Capillary: 360 mg/dL — ABNORMAL HIGH (ref 70–99)
Glucose-Capillary: 337 mg/dL — ABNORMAL HIGH (ref 70–99)

## 2018-07-14 DIAGNOSIS — E1069 Type 1 diabetes mellitus with other specified complication: Secondary | ICD-10-CM | POA: Diagnosis not present

## 2018-07-14 LAB — GLUCOSE, CAPILLARY
Glucose-Capillary: 242 mg/dL — ABNORMAL HIGH (ref 70–99)
Glucose-Capillary: 247 mg/dL — ABNORMAL HIGH (ref 70–99)

## 2018-07-29 ENCOUNTER — Ambulatory Visit: Payer: BLUE CROSS/BLUE SHIELD | Admitting: Podiatry

## 2018-07-29 DIAGNOSIS — M720 Palmar fascial fibromatosis [Dupuytren]: Secondary | ICD-10-CM | POA: Insufficient documentation

## 2018-08-30 ENCOUNTER — Other Ambulatory Visit: Payer: Self-pay

## 2018-08-30 ENCOUNTER — Ambulatory Visit (INDEPENDENT_AMBULATORY_CARE_PROVIDER_SITE_OTHER): Payer: BLUE CROSS/BLUE SHIELD | Admitting: Podiatry

## 2018-08-30 VITALS — Temp 97.7°F

## 2018-08-30 DIAGNOSIS — M216X9 Other acquired deformities of unspecified foot: Secondary | ICD-10-CM

## 2018-08-30 DIAGNOSIS — G600 Hereditary motor and sensory neuropathy: Secondary | ICD-10-CM | POA: Diagnosis not present

## 2018-08-30 DIAGNOSIS — E1149 Type 2 diabetes mellitus with other diabetic neurological complication: Secondary | ICD-10-CM | POA: Diagnosis not present

## 2018-08-30 DIAGNOSIS — Z872 Personal history of diseases of the skin and subcutaneous tissue: Secondary | ICD-10-CM | POA: Diagnosis not present

## 2018-08-30 DIAGNOSIS — L84 Corns and callosities: Secondary | ICD-10-CM

## 2018-08-30 NOTE — Progress Notes (Signed)
Subjective: 53 year old male presents the office today for follow up evaluation.  Since I last saw him he was at the wound care center and the wound on his left foot eventually healed.  He presents today to discuss options in order to try to help prevent any new wounds from forming.  He comes denies any open sores ataxia swelling or redness.  He has no other concerns. Denies any systemic complaints such as fevers, chills, nausea, vomiting. No acute changes since last appointment, and no other complaints at this time.   Objective: AAO x3, NAD DP/PT pulses palpable bilaterally, CRT less than 3 seconds Protective sensation decreased with Simms Weinstein monofilament Incision for the prior infection and wound has completely healed.  Mild hyperkeratotic tissue present submetatarsal 5 on the left foot.  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  There is no edema, erythema bilaterally. No edema, erythema, increase in warmth to bilateral lower extremities.  No open lesions or pre-ulcerative lesions.  No pain with calf compression, swelling, warmth, erythema  Assessment: History of infection/wound left foot which is healed, diabetes with neuropathy, CMT  Plan: -All treatment options discussed with the patient including all alternatives, risks, complications.  -At this time the wound is healed.  I did lightly debride the hyperkeratotic tissue on the left foot without any complications or bleeding to the fifth metatarsal head.  He presents today discuss long-term options to prevent reoccurrence of infection wounds.  I do think he needs a new orthotic.  He was molded for new inserts today and he was seen today by Raiford Noble for this.  The goal this is to help with the arch and also help offload. -Discussed importance daily foot inspection. -Recommend 36-month follow-up for foot evaluation.  RTC 3 weeks to PUO  Vivi Barrack DPM

## 2018-09-21 ENCOUNTER — Ambulatory Visit: Payer: BLUE CROSS/BLUE SHIELD | Admitting: Orthotics

## 2018-09-21 ENCOUNTER — Other Ambulatory Visit: Payer: Self-pay

## 2018-09-21 VITALS — Temp 97.3°F

## 2018-09-21 DIAGNOSIS — L03116 Cellulitis of left lower limb: Secondary | ICD-10-CM

## 2018-09-21 DIAGNOSIS — G600 Hereditary motor and sensory neuropathy: Secondary | ICD-10-CM

## 2018-09-21 DIAGNOSIS — E1149 Type 2 diabetes mellitus with other diabetic neurological complication: Secondary | ICD-10-CM

## 2018-09-21 DIAGNOSIS — L84 Corns and callosities: Secondary | ICD-10-CM

## 2018-11-29 ENCOUNTER — Ambulatory Visit: Payer: BC Managed Care – PPO | Admitting: Podiatry

## 2019-03-15 DIAGNOSIS — S42022A Displaced fracture of shaft of left clavicle, initial encounter for closed fracture: Secondary | ICD-10-CM | POA: Insufficient documentation

## 2019-06-28 ENCOUNTER — Ambulatory Visit (INDEPENDENT_AMBULATORY_CARE_PROVIDER_SITE_OTHER): Payer: BLUE CROSS/BLUE SHIELD

## 2019-06-28 ENCOUNTER — Ambulatory Visit (INDEPENDENT_AMBULATORY_CARE_PROVIDER_SITE_OTHER): Payer: BLUE CROSS/BLUE SHIELD | Admitting: Podiatry

## 2019-06-28 ENCOUNTER — Other Ambulatory Visit: Payer: Self-pay

## 2019-06-28 ENCOUNTER — Other Ambulatory Visit: Payer: Self-pay | Admitting: *Deleted

## 2019-06-28 DIAGNOSIS — L03119 Cellulitis of unspecified part of limb: Secondary | ICD-10-CM

## 2019-06-28 DIAGNOSIS — L02619 Cutaneous abscess of unspecified foot: Secondary | ICD-10-CM | POA: Diagnosis not present

## 2019-06-28 DIAGNOSIS — E1149 Type 2 diabetes mellitus with other diabetic neurological complication: Secondary | ICD-10-CM

## 2019-06-28 DIAGNOSIS — L84 Corns and callosities: Secondary | ICD-10-CM | POA: Diagnosis not present

## 2019-06-28 DIAGNOSIS — G600 Hereditary motor and sensory neuropathy: Secondary | ICD-10-CM

## 2019-06-28 DIAGNOSIS — L97501 Non-pressure chronic ulcer of other part of unspecified foot limited to breakdown of skin: Secondary | ICD-10-CM

## 2019-06-28 DIAGNOSIS — L97521 Non-pressure chronic ulcer of other part of left foot limited to breakdown of skin: Secondary | ICD-10-CM | POA: Diagnosis not present

## 2019-06-28 MED ORDER — DOXYCYCLINE HYCLATE 100 MG PO TABS: 100.0000 mg | ORAL_TABLET | Freq: Two times a day (BID) | ORAL | 0 refills | Status: DC

## 2019-06-28 NOTE — Progress Notes (Signed)
Subjective: 54 year old male presents to the office today for evaluation of a callus/wound on the left foot which he started to notice was bleeding.  He states the areas become painful with shoes and around the callus.  Is not had any drainage or pus.  He said no recent treatment for this.  On the right side is noticed the big toenail is loose but denies any redness or drainage.  No swelling the last 2 weeks.  He has no other concerns today. Denies any systemic complaints such as fevers, chills, nausea, vomiting. No acute changes since last appointment, and no other complaints at this time.   Objective: AAO x3, NAD DP/PT pulses palpable bilaterally, CRT less than 3 seconds On the left foot submetatarsal 5 is a hyperkeratotic lesion with dried blood.  Upon debridement there is an underlying ulceration measuring 1 x 0.7 cm and superficial with a granular wound base.  There is no probing, undermining or tunneling.  There is localized edema and erythema there is mild streaking going laterally to the dorsal aspect of the foot.  Mild increase in warmth to the foot.  No fluctuation or crepitation.  There is no malodor. On the right foot the nail is loose from the nail bed. There is no redness, drainage, or any other signs of infection. No pain  No open lesions or pre-ulcerative lesions.  No pain with calf compression, swelling, warmth, erythema  Assessment: Left foot ulceration with cellulitis; right hallux onycholysis  Plan: -All treatment options discussed with the patient including all alternatives, risks, complications.  -X-rays obtained and reviewed.  Chronic changes present the fourth metatarsal head there is no evidence of acute fracture, stress fracture, osteomyelitis or soft tissue edema. -Debrided the callus and the wound on the left foot with any complications on healthy, granular tissue. Medihoney was applied followed by dressing.  He can continue this at home as well.  I prescribed  doxycycline.  Offloading and we made him a PegAssist inside of his surgical shoe that he has at home.  There is any worsening signs or symptoms of infection he is to report to the emergency department. -Debride the right hallux toenail in complications or bleeding. -Patient encouraged to call the office with any questions, concerns, change in symptoms.   Vivi Barrack DPM

## 2019-07-04 ENCOUNTER — Ambulatory Visit (INDEPENDENT_AMBULATORY_CARE_PROVIDER_SITE_OTHER): Payer: BLUE CROSS/BLUE SHIELD | Admitting: Podiatry

## 2019-07-04 ENCOUNTER — Other Ambulatory Visit: Payer: Self-pay

## 2019-07-04 DIAGNOSIS — E1149 Type 2 diabetes mellitus with other diabetic neurological complication: Secondary | ICD-10-CM | POA: Diagnosis not present

## 2019-07-04 DIAGNOSIS — L97501 Non-pressure chronic ulcer of other part of unspecified foot limited to breakdown of skin: Secondary | ICD-10-CM

## 2019-07-05 ENCOUNTER — Other Ambulatory Visit: Payer: BLUE CROSS/BLUE SHIELD | Admitting: Orthotics

## 2019-07-05 ENCOUNTER — Encounter: Payer: Self-pay | Admitting: Podiatry

## 2019-07-05 NOTE — Progress Notes (Signed)
Subjective:  Patient ID: [name], male    DOB: July 19, 1965,  MRN: [id number]  Chief Complaint  Patient presents with  . Wound Check    pt is here for a wound check of the left foot, pt shows no signs of infection, ulcer is still open, pt also states that the cam boot he has been using has helped out as well.    54 y.o. male presents for wound care.  Patient states that he is doing much better.  He is here for follow-up of left fifth metatarsal submet 5 wound.  Patient states he has been using a cam boot which has been helping out a lot.  He does not have any redness surrounding the wound anymore.  He denies any other acute complaints.  Pain scale is 6 out of 10.   Review of Systems: Negative except as noted in the HPI. Denies N/V/F/Ch.  Past Medical History:  Diagnosis Date  . ADHD (attention deficit hyperactivity disorder)   . Anxiety and depression   . Candida esophagitis (HCC)    in setting of DKA/hyperglycemia  . CIDP (chronic inflammatory demyelinating polyneuropathy) (HCC)   . Depression   . Diabetes type 2, controlled (HCC)   . Diverticulosis   . Essential hypertension 06/21/2014  . Hyperlipidemia   . Kidney stones   . Pancreatitis   . Stomach ulcer   . Vitamin D deficiency     Current Outpatient Medications:  .  acetaminophen (TYLENOL) 325 MG tablet, Take by mouth., Disp: , Rfl:  .  Amphetamine Sulfate (EVEKEO) 10 MG TABS, Take 20 mg by mouth 2 (two) times daily. , Disp: , Rfl:  .  aspirin 81 MG tablet, Take 1 tablet (81 mg total) by mouth daily., Disp: 30 tablet, Rfl: 0 .  betamethasone acetate-betamethasone sodium phosphate (CELESTONE) 6 (3-3) MG/ML injection, , Disp: , Rfl:  .  buPROPion (WELLBUTRIN XL) 150 MG 24 hr tablet, , Disp: , Rfl: 0 .  calcium carbonate (TUMS - DOSED IN MG ELEMENTAL CALCIUM) 500 MG chewable tablet, Chew 1 tablet by mouth as needed for indigestion or heartburn., Disp: , Rfl:  .  calcium carbonate (TUMS) 500 MG chewable tablet, Chew 1  tablet by mouth daily as needed., Disp: , Rfl:  .  Continuous Blood Gluc Receiver (FREESTYLE LIBRE 14 DAY READER) DEVI, every 14 (fourteen) days. , Disp: , Rfl:  .  Continuous Blood Gluc Sensor (FREESTYLE LIBRE 14 DAY SENSOR) MISC, , Disp: , Rfl: 2 .  doxycycline (VIBRA-TABS) 100 MG tablet, Take 1 tablet (100 mg total) by mouth 2 (two) times daily., Disp: 20 tablet, Rfl: 0 .  DULoxetine (CYMBALTA) 60 MG capsule, Take 60 mg by mouth daily., Disp: , Rfl: 10 .  fenofibrate 160 MG tablet, Take 160 mg by mouth daily., Disp: , Rfl:  .  fluconazole (DIFLUCAN) 100 MG tablet, Take 2 tablets by mouth on day 1 then take 1 tablet daily until gone, Disp: 22 tablet, Rfl: 0 .  gabapentin (NEURONTIN) 800 MG tablet, Take 800 mg by mouth 3 (three) times daily., Disp: , Rfl:  .  HYDROcodone-acetaminophen (NORCO/VICODIN) 5-325 MG tablet, , Disp: , Rfl:  .  insulin aspart (NOVOLOG FLEXPEN) 100 UNIT/ML FlexPen, Inject 22-32 Units into the skin See admin instructions. Inject 22-32 units into the skin three to four times a day before meals, per sliding scale, Disp: , Rfl:  .  Insulin Glargine (LANTUS SOLOSTAR) 100 UNIT/ML Solostar Pen, Inject 70 Units into the skin at bedtime.,  Disp: , Rfl:  .  insulin NPH-regular Human (NOVOLIN 70/30) (70-30) 100 UNIT/ML injection, Insulin NPH 40 units in am and 30 Units at night time., Disp: 100 mL, Rfl: 1 .  morphine (MSIR) 15 MG tablet, SMARTSIG:0.5 Tablet(s) By Mouth Every 4 Hours, Disp: , Rfl:  .  pantoprazole (PROTONIX) 40 MG tablet, Take 1 tablet (40 mg total) by mouth daily before breakfast., Disp: 30 tablet, Rfl: 0 .  sildenafil (REVATIO) 20 MG tablet, Take 3 tablets by mouth one hour prior to sex as needed. Do not take more than one dose every 24 hours., Disp: , Rfl:  .  zolpidem (AMBIEN) 10 MG tablet, Take 10 mg by mouth at bedtime as needed for sleep. , Disp: , Rfl:  .  lamoTRIgine (LAMICTAL) 150 MG tablet, Take 1 tablet by mouth daily., Disp: , Rfl:   Social History    Tobacco Use  Smoking Status Former Smoker  Smokeless Tobacco Former Systems developer    Allergies  Allergen Reactions  . Lithium Other (See Comments)    "Made me feel like I was walking in chest-deep water. I didn't like it."  . Adderall [Amphetamine-Dextroamphetamine] Palpitations and Other (See Comments)    Hyperactivity, also  . Ritalin [Methylphenidate] Palpitations and Other (See Comments)    Hyperactivity, also   Objective:  There were no vitals filed for this visit. There is no height or weight on file to calculate BMI. Constitutional Well developed. Well nourished.  Vascular Dorsalis pedis pulses palpable bilaterally. Posterior tibial pulses palpable bilaterally. Capillary refill normal to all digits.  No cyanosis or clubbing noted. Pedal hair growth normal.  Neurologic Normal speech. Oriented to person, place, and time. Protective sensation absent  Dermatologic Wound Location: Left submetatarsal 5 wound Wound Base: Mixed Granular/Fibrotic Peri-wound: Macerated Exudate: Scant/small amount Serous exudate Wound Measurements: -See below  Orthopedic: No pain to palpation either foot.   Radiographs: None Assessment:   1. Type II diabetes mellitus with neurological manifestations ([place])   2. Ulcer of foot, limited to breakdown of skin, unspecified laterality [facility])    Plan:  Patient was evaluated and treated and all questions answered.  Ulcer left submetatarsal 5 wound -Debridement as below. -Dressed with Betadine wet-to-dry, DSD. -Continue off-loading with cam boot -No clinical signs of infection it appears that cellulitis has resolved.  Procedure: Excisional Debridement of Wound Rationale: Removal of non-viable soft tissue from the wound to promote healing.  Anesthesia: none Pre-Debridement Wound Measurements: 1.5 cm x 1 cm x 0.3 cm  Post-Debridement Wound Measurements: Same Type of Debridement: Sharp Excisional Tissue Removed: Non-viable soft tissue Depth of  Debridement: Limited to the breakdown of the skin Technique: Sharp excisional debridement to bleeding, viable wound base.  Dressing: Dry, sterile, compression dressing. Disposition: Patient tolerated procedure well. Patient to return in 1 week for follow-up.  No follow-ups on file.

## 2019-07-14 ENCOUNTER — Telehealth: Payer: Self-pay

## 2019-07-14 NOTE — Telephone Encounter (Signed)
I called the patient back to see what was going on. Left VM to call back.   If the boot is causing the pain then he can come in to get a new one. I want to make sure the pain is not coming from infection.

## 2019-07-14 NOTE — Telephone Encounter (Signed)
Patient left a message. He has been wearing an old boot that he had at home. He reports that it is not fitting properly and he now has constant leg and foot pain whether he is wearing the boot or not, His next appointment is on 07/21/2019.  Please advise Thanks

## 2019-07-15 NOTE — Telephone Encounter (Signed)
Called and left a message for the patient to call me back at the [place] office at [phone]. Vincenzina Jagoda 

## 2019-07-20 ENCOUNTER — Ambulatory Visit: Payer: BLUE CROSS/BLUE SHIELD | Admitting: Podiatry

## 2019-07-21 ENCOUNTER — Ambulatory Visit: Payer: BLUE CROSS/BLUE SHIELD | Admitting: Podiatry

## 2019-07-26 ENCOUNTER — Encounter: Payer: Self-pay | Admitting: Podiatry

## 2019-07-26 ENCOUNTER — Ambulatory Visit (INDEPENDENT_AMBULATORY_CARE_PROVIDER_SITE_OTHER): Payer: BLUE CROSS/BLUE SHIELD | Admitting: Podiatry

## 2019-07-26 ENCOUNTER — Other Ambulatory Visit: Payer: Self-pay

## 2019-07-26 ENCOUNTER — Ambulatory Visit: Payer: BLUE CROSS/BLUE SHIELD | Admitting: Orthotics

## 2019-07-26 DIAGNOSIS — L97501 Non-pressure chronic ulcer of other part of unspecified foot limited to breakdown of skin: Secondary | ICD-10-CM

## 2019-07-26 DIAGNOSIS — G600 Hereditary motor and sensory neuropathy: Secondary | ICD-10-CM

## 2019-07-26 DIAGNOSIS — E1149 Type 2 diabetes mellitus with other diabetic neurological complication: Secondary | ICD-10-CM | POA: Diagnosis not present

## 2019-07-26 MED ORDER — AMOXICILLIN-POT CLAVULANATE 875-125 MG PO TABS: 1.0000 | ORAL_TABLET | Freq: Two times a day (BID) | ORAL | 0 refills | Status: DC

## 2019-07-26 NOTE — Progress Notes (Signed)
Adjusted f/o by making offload deeper, adding scaphoid pad to arch.

## 2019-08-01 ENCOUNTER — Emergency Department (HOSPITAL_COMMUNITY)
Admission: EM | Admit: 2019-08-01 | Discharge: 2019-08-02 | Disposition: A | Discharge status: Home / Self Care | Payer: BLUE CROSS/BLUE SHIELD | Attending: Emergency Medicine | Admitting: Emergency Medicine

## 2019-08-01 ENCOUNTER — Ambulatory Visit: Payer: BLUE CROSS/BLUE SHIELD | Admitting: Podiatry

## 2019-08-01 ENCOUNTER — Ambulatory Visit (INDEPENDENT_AMBULATORY_CARE_PROVIDER_SITE_OTHER): Payer: BLUE CROSS/BLUE SHIELD | Admitting: Podiatry

## 2019-08-01 ENCOUNTER — Emergency Department (HOSPITAL_COMMUNITY): Payer: BLUE CROSS/BLUE SHIELD

## 2019-08-01 ENCOUNTER — Other Ambulatory Visit: Payer: Self-pay

## 2019-08-01 ENCOUNTER — Telehealth: Payer: Self-pay | Admitting: *Deleted

## 2019-08-01 DIAGNOSIS — L03119 Cellulitis of unspecified part of limb: Secondary | ICD-10-CM | POA: Diagnosis not present

## 2019-08-01 DIAGNOSIS — L03116 Cellulitis of left lower limb: Secondary | ICD-10-CM | POA: Diagnosis not present

## 2019-08-01 DIAGNOSIS — Z7982 Long term (current) use of aspirin: Secondary | ICD-10-CM | POA: Diagnosis not present

## 2019-08-01 DIAGNOSIS — L02619 Cutaneous abscess of unspecified foot: Secondary | ICD-10-CM | POA: Diagnosis not present

## 2019-08-01 DIAGNOSIS — Z87891 Personal history of nicotine dependence: Secondary | ICD-10-CM | POA: Insufficient documentation

## 2019-08-01 DIAGNOSIS — E1149 Type 2 diabetes mellitus with other diabetic neurological complication: Secondary | ICD-10-CM | POA: Diagnosis not present

## 2019-08-01 DIAGNOSIS — L97501 Non-pressure chronic ulcer of other part of unspecified foot limited to breakdown of skin: Secondary | ICD-10-CM

## 2019-08-01 DIAGNOSIS — Z794 Long term (current) use of insulin: Secondary | ICD-10-CM | POA: Diagnosis not present

## 2019-08-01 DIAGNOSIS — Z79899 Other long term (current) drug therapy: Secondary | ICD-10-CM | POA: Insufficient documentation

## 2019-08-01 DIAGNOSIS — M79672 Pain in left foot: Secondary | ICD-10-CM | POA: Diagnosis present

## 2019-08-01 DIAGNOSIS — L089 Local infection of the skin and subcutaneous tissue, unspecified: Secondary | ICD-10-CM | POA: Insufficient documentation

## 2019-08-01 DIAGNOSIS — E1140 Type 2 diabetes mellitus with diabetic neuropathy, unspecified: Secondary | ICD-10-CM | POA: Diagnosis not present

## 2019-08-01 DIAGNOSIS — I10 Essential (primary) hypertension: Secondary | ICD-10-CM | POA: Insufficient documentation

## 2019-08-01 LAB — COMPREHENSIVE METABOLIC PANEL
ALT: 21 U/L (ref 0–44)
AST: 19 U/L (ref 15–41)
Albumin: 3.4 g/dL — ABNORMAL LOW (ref 3.5–5.0)
Alkaline Phosphatase: 96 U/L (ref 38–126)
Anion gap: 8 (ref 5–15)
BUN: 9 mg/dL (ref 6–20)
CO2: 25 mmol/L (ref 22–32)
Calcium: 9.1 mg/dL (ref 8.9–10.3)
Chloride: 101 mmol/L (ref 98–111)
Creatinine, Ser: 1.1 mg/dL (ref 0.61–1.24)
GFR calc Af Amer: >60 mL/min (ref >60)
GFR calc non Af Amer: >60 mL/min (ref >60)
Glucose, Bld: 345 mg/dL — ABNORMAL HIGH (ref 70–99)
Potassium: 4.2 mmol/L (ref 3.5–5.1)
Sodium: 134 mmol/L — ABNORMAL LOW (ref 135–145)
Total Bilirubin: 0.5 mg/dL (ref 0.3–1.2)
Total Protein: 6.9 g/dL (ref 6.5–8.1)

## 2019-08-01 LAB — CBC WITH DIFFERENTIAL/PLATELET
Abs Immature Granulocytes: 0.04 10*3/uL (ref 0.00–0.07)
Basophils Absolute: 0.1 10*3/uL (ref 0.0–0.1)
Basophils Relative: 1 %
Eosinophils Absolute: 0.3 10*3/uL (ref 0.0–0.5)
Eosinophils Relative: 4 %
HCT: 39.9 % (ref 39.0–52.0)
Hemoglobin: 13.3 g/dL (ref 13.0–17.0)
Immature Granulocytes: 1 %
Lymphocytes Relative: 30 %
Lymphs Abs: 2.3 10*3/uL (ref 0.7–4.0)
MCH: 29.7 pg (ref 26.0–34.0)
MCHC: 33.3 g/dL (ref 30.0–36.0)
MCV: 89.1 fL (ref 80.0–100.0)
Monocytes Absolute: 0.5 10*3/uL (ref 0.1–1.0)
Monocytes Relative: 6 %
Neutro Abs: 4.3 10*3/uL (ref 1.7–7.7)
Neutrophils Relative %: 58 %
Platelets: 301 10*3/uL (ref 150–400)
RBC: 4.48 MIL/uL (ref 4.22–5.81)
RDW: 13.1 % (ref 11.5–15.5)
WBC: 7.5 10*3/uL (ref 4.0–10.5)
nRBC: 0 % (ref 0.0–0.2)

## 2019-08-01 LAB — URINALYSIS, ROUTINE W REFLEX MICROSCOPIC
Bacteria, UA: NONE SEEN
Bilirubin Urine: NEGATIVE
Glucose, UA: >=500 mg/dL — AB
Hgb urine dipstick: NEGATIVE
Ketones, ur: NEGATIVE mg/dL
Leukocytes,Ua: NEGATIVE
Nitrite: NEGATIVE
Protein, ur: NEGATIVE mg/dL
Specific Gravity, Urine: 1.035 — ABNORMAL HIGH (ref 1.005–1.030)
pH: 5 (ref 5.0–8.0)

## 2019-08-01 LAB — LACTIC ACID, PLASMA: Lactic Acid, Venous: 2.1 mmol/L (ref 0.5–1.9)

## 2019-08-01 MED ORDER — SODIUM CHLORIDE 0.9% FLUSH: 3.0000 mL | Freq: Once | INTRAVENOUS | Status: DC

## 2019-08-01 NOTE — ED Triage Notes (Signed)
Previous note written by Jacalyn Lefevre RN

## 2019-08-01 NOTE — ED Triage Notes (Signed)
Pt. Was sent to the ER from podiatrist Dr. Loreta Ave to be seen for his left foot wound. Wound is not healing and MD wants an MRI to make sure its had no abscess. Pain 2/10 to foot. Patient is currently taking Amoxicillin BID.  Pt. Is DM

## 2019-08-01 NOTE — Progress Notes (Signed)
Subjective: 54 year old male presents the office for follow-up evaluation of an ulcer to his left foot.  He states that he has noticed some bleeding coming from the area and that the boot is causing quite a bit of discomfort.  He denies any pus.  No increase in swelling or redness to his foot.  He recently had shoulder surgery yesterday. Denies any systemic complaints such as fevers, chills, nausea, vomiting. No acute changes since last appointment, and no other complaints at this time.   Objective: AAO x3, NAD DP/PT pulses palpable bilaterally, CRT less than 3 seconds On the left foot submetatarsal 5 hyperkeratotic lesion with central granular wound.  Mild surrounding erythema and minimal edema but there is no purulence.  There is no fluctuation or crepitation.  There is no malodor. No open lesions or pre-ulcerative lesions.  No pain with calf compression, swelling, warmth, erythema  Assessment: Left foot ulceration with mild erythema  Plan: -All treatment options discussed with the patient including all alternatives, risks, complications.  -I debrided the wound utilizing #312 with scalpel without any complications down to healthy, bleeding tissue.  We will continue doxycycline.  This was refilled today.  Monitor closely for any signs or symptoms of infection. -Continue offloading shoe and I did further modify this for him today.  He is coming to see Raiford Noble later today for further modifications for offloading. -Continue daily dressing changes. -Patient encouraged to call the office with any questions, concerns, change in symptoms.   Vivi Barrack DPM

## 2019-08-01 NOTE — Telephone Encounter (Signed)
I spoke with [place] - [name] transferred to [facility] and I informed of pt left lower leg cellulitis transported by private vehicle.

## 2019-08-01 NOTE — Progress Notes (Signed)
Subjective: 54 year old male presents the office for follow-up evaluation of an ulcer to his left foot.  He states that he did not start the antibiotics until Thursday morning.  On Thursday he started to have fevers and he thought this was coming from his hand surgery.  He has not follow-up with his hand surgeon yet.  Over the weekend he noticed increased redness and fevers.  He called the on-call physician and was recommended to the emergency room and he did not go.  He states that since starting the oral antibiotics the fevers have resolved and the redness is somewhat improved as well as the swelling.  He currently denies any fevers, chills, nausea, vomiting.   Objective: AAO x3, NAD DP/PT pulses palpable bilaterally, CRT less than 3 seconds On the left foot submetatarsal 5 hyperkeratotic lesion with central granular wound.  There is a new area of skin breakdown to the lateral aspect the fifth metatarsal head.  There is serosanguineous drainage.  There is erythema extending to the dorsum of the foot.  There is warmth of foot.  There is swelling to the foot.  No pain with calf compression, swelling, warmth, erythema  Assessment: Left foot ulceration with foot cellulitis  Plan: -All treatment options discussed with the patient including all alternatives, risks, complications.  -I debrided the wound today.  Does not able to identify any purulence.  Given the worsening of the cellulitis I have recommended admission to the hospital for IV antibiotics.  He has a follow-up for his hand surgery this afternoon.  I contacted her surgeon's office and they can see him at 130 today and afterwards he is going to go to the emergency room.  Recommend IV antibiotics, MRI left foot as well as x-rays.  I will be happy to follow the patient while in the hospital.  Ovid Curd, DPM O: [phone] C: [phone]

## 2019-08-01 NOTE — ED Notes (Signed)
Pt taken to xray 

## 2019-08-01 NOTE — ED Notes (Signed)
Date and time results received: 08/01/19 1752 (use smartphrase ".now" to insert current time)  Test: Lactic Critical Value: 2.1  Name of Provider Notified:[place] Orders Received? Or Actions Taken?: none

## 2019-08-02 ENCOUNTER — Telehealth: Payer: Self-pay | Admitting: *Deleted

## 2019-08-02 ENCOUNTER — Emergency Department (HOSPITAL_COMMUNITY): Payer: BLUE CROSS/BLUE SHIELD

## 2019-08-02 LAB — LACTIC ACID, PLASMA: Lactic Acid, Venous: 2.1 mmol/L (ref 0.5–1.9)

## 2019-08-02 MED ORDER — SODIUM CHLORIDE 0.9 % IV SOLN
1.0000 g | Freq: Once | INTRAVENOUS | Status: AC
  Administered 2019-08-02: 1 g via INTRAVENOUS
  Filled 2019-08-02: qty 10

## 2019-08-02 MED ORDER — DOXYCYCLINE HYCLATE 100 MG PO CAPS: 100.0000 mg | ORAL_CAPSULE | Freq: Two times a day (BID) | ORAL | 0 refills | Status: DC

## 2019-08-02 MED ORDER — VANCOMYCIN HCL IN DEXTROSE 1-5 GM/200ML-% IV SOLN
1000.0000 mg | Freq: Once | INTRAVENOUS | Status: AC
  Administered 2019-08-02: 1000 mg via INTRAVENOUS
  Filled 2019-08-02: qty 200

## 2019-08-02 NOTE — ED Provider Notes (Signed)
[facility] EMERGENCY DEPARTMENT Provider Note   CSN: [id number] Arrival date & time: 08/01/19  1623     History Chief Complaint  Patient presents with  . Foot Pain    [name] is a 54 y.o. male.  HPI     This a 54 year old male with a history of diabetes, neuropathy, hypertension, hyperlipidemia who presents with left foot wound.  Patient has been followed closely by podiatrist, Dr. Ardelle Anton for ongoing left foot wounds.  He was seen last Tuesday and was started on amoxicillin for infection.  Patient states that he did not get this filled until Thursday.  On Wednesday he noted a fever at home to 101 and noted redness across the foot as well swelling.  He started taking the amoxicillin and overall felt that his foot looks better.  For this reason, he followed up on Monday.  Upon evaluation yesterday, Dr. Ardelle Anton was concerned that he failed outpatient treatment for cellulitis and also questioned deeper space infection.  He referred him here for imaging and lab work.  Patient expresses great frustration with his ER wait time.  He states he feels fine and feels that his foot is actually better than it was late last week.  He has not had any ongoing fevers.  Last fever was on Friday.  He states "I am not going to get admitted to the hospital.  I already is too much money."  Of note, patient does not noted any significant pain.  Of note, patient reports that he had COVID-19 in January 2021.  Past Medical History:  Diagnosis Date  . ADHD (attention deficit hyperactivity disorder)   . Anxiety and depression   . Candida esophagitis (HCC)    in setting of DKA/hyperglycemia  . CIDP (chronic inflammatory demyelinating polyneuropathy) (HCC)   . Depression   . Diabetes type 2, controlled (HCC)   . Diverticulosis   . Essential hypertension 06/21/2014  . Hyperlipidemia   . Kidney stones   . Pancreatitis   . Stomach ulcer   . Vitamin D deficiency     Patient Active  Problem List   Diagnosis Date Noted  . Dupuytren's contracture 07/29/2018  . PICC (peripherally inserted central catheter) in place 05/24/2018  . Neuropathy 05/12/2018  . Osteomyelitis of ankle or foot, acute, left (HCC) 05/05/2018  . Diabetic ulcer of left midfoot associated with type 2 diabetes mellitus, limited to breakdown of skin (HCC) 12/18/2017  . Chronic pain syndrome 11/03/2017  . Esophageal dysphagia   . DKA, type 2 (HCC) 07/15/2017  . ADD (attention deficit disorder) 07/15/2017  . DKA (diabetic ketoacidoses) (HCC) 07/15/2017  . Vitamin B12 deficiency (non anemic) 08/13/2016  . Adhesive capsulitis of left shoulder 01/18/2016  . Trigger ring finger of right hand 10/11/2015  . Primary osteoarthritis of first carpometacarpal joint of left hand 06/14/2015  . Radial styloid tenosynovitis 06/14/2015  . Depression 10/05/2014  . Noncompliance with diabetes treatment 10/05/2014  . Vitamin D deficiency 09/14/2014  . 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 Diabetic peripheral neuropathy associated with type 2 diabetes mellitus (HCC) 06/21/2014  . Essential hypertension 06/21/2014  . Erectile dysfunction associated with type 2 diabetes mellitus (HCC) 06/21/2014  . Long-term current use of insulin for diabetes mellitus (HCC) 06/21/2014  . Adhesive capsulitis of right shoulder 03/24/2011  . Retinal detachment 12/06/2010  . Rotator cuff syndrome 12/06/2010  . History of pancreatitis 10/22/2010  . Hypertriglyceridemia 10/22/2010  . Diabetes mellitus type II, uncontrolled (HCC) 10/22/2010  Past Surgical History:  Procedure Laterality Date  . ANKLE SURGERY    . CERVICAL DISC SURGERY     C4-C5  . ELBOW SURGERY    . ESOPHAGOGASTRODUODENOSCOPY N/A 07/16/2017   Procedure: ESOPHAGOGASTRODUODENOSCOPY (EGD);  Surgeon: [name], MD;  Location: [facility] ENDOSCOPY;  Service: Endoscopy;  Laterality: N/A;  . RETINAL DETACHMENT SURGERY    . RHINOPLASTY    . VASECTOMY    . WOUND  DEBRIDEMENT Left 03/27/2018   Procedure: DEBRIDEMENT WOUND;  Surgeon: Vivi Barrack, DPM;  Location: [facility] OR;  Service: Podiatry;  Laterality: Left;       Family History  Problem Relation Age of Onset  . Stroke Father   . Breast cancer Mother   . Colon cancer Neg Hx     Social History   Tobacco Use  . Smoking status: Former Games developer  . Smokeless tobacco: Former Engineer, water Use Topics  . Alcohol use: Yes    Comment: Socially  . Drug use: No    Home Medications Prior to Admission medications   Medication Sig Start Date End Date Taking? Authorizing Provider  acetaminophen (TYLENOL) 325 MG tablet Take by mouth.    [provider]  amoxicillin-clavulanate (AUGMENTIN) 875-125 MG tablet Take 1 tablet by mouth 2 (two) times daily. 07/26/19   Vivi Barrack, DPM  Amphetamine Sulfate (EVEKEO) 10 MG TABS Take 20 mg by mouth 2 (two) times daily.     [provider]  aspirin 81 MG tablet Take 1 tablet (81 mg total) by mouth daily. 07/17/17   Berton Mount I, MD  betamethasone acetate-betamethasone sodium phosphate (CELESTONE) 6 (3-3) MG/ML injection  05/19/19   [provider]  buPROPion (WELLBUTRIN XL) 150 MG 24 hr tablet  04/01/18   [provider]  calcium carbonate (TUMS - DOSED IN MG ELEMENTAL CALCIUM) 500 MG chewable tablet Chew 1 tablet by mouth as needed for indigestion or heartburn.    [provider]  calcium carbonate (TUMS) 500 MG chewable tablet Chew 1 tablet by mouth daily as needed.    [provider]  Continuous Blood Gluc Receiver (FREESTYLE LIBRE 14 DAY READER) DEVI every 14 (fourteen) days.  01/05/18   [provider]  Continuous Blood Gluc Sensor (FREESTYLE LIBRE 14 DAY SENSOR) MISC  04/14/18   [provider]  doxycycline (VIBRAMYCIN) 100 MG capsule Take 1 capsule (100 mg total) by mouth 2 (two) times daily. 08/02/19   Nazia Rhines, Mayer Masker, MD  DULoxetine (CYMBALTA) 60 MG capsule Take 60 mg by  mouth daily. 02/26/18   [provider]  fenofibrate 160 MG tablet Take 160 mg by mouth daily.    [provider]  fluconazole (DIFLUCAN) 100 MG tablet Take 2 tablets by mouth on day 1 then take 1 tablet daily until gone 07/22/17   [name], MD  gabapentin (NEURONTIN) 800 MG tablet Take 800 mg by mouth 3 (three) times daily.    [provider]  HYDROcodone-acetaminophen (NORCO/VICODIN) 5-325 MG tablet  03/22/19   [provider]  insulin aspart (NOVOLOG FLEXPEN) 100 UNIT/ML FlexPen Inject 22-32 Units into the skin See admin instructions. Inject 22-32 units into the skin three to four times a day before meals, per sliding scale    [provider]  Insulin Glargine (LANTUS SOLOSTAR) 100 UNIT/ML Solostar Pen Inject 70 Units into the skin at bedtime.    [provider]  insulin NPH-regular Human (NOVOLIN 70/30) (70-30) 100 UNIT/ML injection Insulin NPH 40 units in  am and 30 Units at night time. 07/16/17   Bonnell Public, MD  lamoTRIgine (LAMICTAL) 150 MG tablet Take 1 tablet by mouth daily. 05/12/18 06/11/18  [provider]  morphine (MSIR) 15 MG tablet SMARTSIG:0.5 Tablet(s) By Mouth Every 4 Hours 02/13/19   [provider]  oxyCODONE-acetaminophen (PERCOCET/ROXICET) 5-325 MG tablet Take by mouth. 07/25/19   [provider]  pantoprazole (PROTONIX) 40 MG tablet Take 1 tablet (40 mg total) by mouth daily before breakfast. 07/17/17   Bonnell Public, MD  sildenafil (REVATIO) 20 MG tablet Take 3 tablets by mouth one hour prior to sex as needed. Do not take more than one dose every 24 hours. 08/20/18   [provider]  zolpidem (AMBIEN) 10 MG tablet Take 10 mg by mouth at bedtime as needed for sleep.     [provider]    Allergies    Lithium, Adderall [amphetamine-dextroamphetamine], and Ritalin [methylphenidate]  Review of Systems   Review of Systems  Constitutional: Negative for fever.    Respiratory: Negative for shortness of breath.   Cardiovascular: Negative for chest pain.  Gastrointestinal: Negative for abdominal pain, nausea and vomiting.  Genitourinary: Negative for dysuria.  Skin: Positive for color change and wound.  All other systems reviewed and are negative.   Physical Exam Updated Vital Signs BP (!) 164/89   Pulse 77   Temp 98.1 F (36.7 C) (Oral)   Resp 16   Ht 1.93 m (6\' 4" )   Wt 111.1 kg   SpO2 100%   BMI 29.82 kg/m   Physical Exam Vitals and nursing note reviewed.  Constitutional:      Appearance: He is well-developed. He is not ill-appearing.  HENT:     Head: Normocephalic and atraumatic.     Mouth/Throat:     Mouth: Mucous membranes are moist.  Eyes:     Conjunctiva/sclera: Conjunctivae normal.  Cardiovascular:     Rate and Rhythm: Normal rate and regular rhythm.  Pulmonary:     Effort: Pulmonary effort is normal. No respiratory distress.  Abdominal:     General: Abdomen is flat.  Musculoskeletal:        General: No tenderness.     Cervical back: Neck supple.     Right lower leg: No edema.     Left lower leg: No edema.       Feet:     Comments: Focused examination of the left foot with slight erythema and swelling noted over the dorsum of the foot extending to the midfoot, there is a superficial ulcerative wound over the lateral aspect just adjacent to the fifth MTP joint, no oozing or purulence noted, there is a second wound with associated callus over the plantar aspect of the foot between the fourth and fifth digits, no purulence or fluctuance noted, 1+ DP pulse  Skin:    General: Skin is warm and dry.     Capillary Refill: Capillary refill takes less than 2 seconds.  Neurological:     Mental Status: He is alert and oriented to person, place, and time.  Psychiatric:        Mood and Affect: Mood normal.     ED Results / Procedures / Treatments   Labs (all labs ordered are listed, but only abnormal results are  displayed) Labs Reviewed  LACTIC ACID, PLASMA - Abnormal; Notable for the following components:      Result Value   Lactic Acid, Venous 2.1 (*)    All other components  within normal limits  LACTIC ACID, PLASMA - Abnormal; Notable for the following components:   Lactic Acid, Venous 2.1 (*)    All other components within normal limits  COMPREHENSIVE METABOLIC PANEL - Abnormal; Notable for the following components:   Sodium 134 (*)    Glucose, Bld 345 (*)    Albumin 3.4 (*)    All other components within normal limits  URINALYSIS, ROUTINE W REFLEX MICROSCOPIC - Abnormal; Notable for the following components:   Specific Gravity, Urine 1.035 (*)    Glucose, UA >=500 (*)    All other components within normal limits  CULTURE, BLOOD (ROUTINE X 2)  CULTURE, BLOOD (ROUTINE X 2)  CBC WITH DIFFERENTIAL/PLATELET    EKG None  Radiology MR FOOT LEFT WO CONTRAST  Result Date: 08/02/2019 CLINICAL DATA:  Foot ulcer, question of osteomyelitis EXAM: MRI OF THE LEFT FOOT WITHOUT CONTRAST TECHNIQUE: Multiplanar, multisequence MR imaging of the left was performed. No intravenous contrast was administered. COMPARISON:  Radiograph same day FINDINGS: Bones/Joint/Cartilage Small cystic changes are seen at the fifth metatarsal head, likely degenerative changes. No marrow edema, osseous fracture, or periosteal reaction are seen throughout the osseous structures. Small joint effusion seen at the fifth MTP joint. There appears to be chronic cortical irregularity with erosive type change seen at the fourth metatarsal head which is unchanged since June 28, 2019, however new since 2019. Ligaments The Lisfranc ligaments and collateral ligaments appear to be intact. Muscles and Tendons Diffusely increased signal seen throughout the muscles surrounding the forefoot with fatty atrophy. The flexor and extensor tendons appear to be intact. The plantar fascia is intact. Soft tissues Along the dorsal lateral aspect of the  fifth metatarsal shaft there is a focal area superficial ulceration measuring approximately 8 mm in transverse dimension. There is overlying skin thickening and subcutaneous edema seen. No sinus tract or loculated fluid collection is noted. IMPRESSION: 1. Small focal area of ulceration along the dorsal lateral aspect of the fifth metatarsal shaft. No definite evidence of acute osteomyelitis, sinus tract, or abscess. 2. Progressive chronic erosive cortical destruction seen at the fourth metatarsal head since 2019, however unchanged since June 28, 2019, which could be from chronic osteomyelitis or prior injury. 3. Small joint effusion at the fifth MTP joint. Electronically Signed   By: Jonna Clark M.D.   On: 08/02/2019 03:35   DG Foot Complete Left  Result Date: 08/02/2019 CLINICAL DATA:  Diabetic foot ulcers EXAM: LEFT FOOT - COMPLETE 3+ VIEW COMPARISON:  06/28/2019 FINDINGS: Foot ulcers are noted along the lateral aspect of the fifth metatarsal although no definitive bony erosive changes are seen. Chronic breakdown of the fourth metatarsal head is noted stable from the previous exam. Mild degenerative changes are noted. No soft tissue abnormality is seen. No fracture is noted. IMPRESSION: Soft tissue ulcers laterally without definitive acute bony erosive changes. Chronic changes as described are again noted stable from previous exam. Electronically Signed   By: Alcide Clever M.D.   On: 08/02/2019 00:04    Procedures Procedures (including critical care time)  CRITICAL CARE Performed by: Shon Baton   Total critical care time: 31 minutes  Critical care time was exclusive of separately billable procedures and treating other patients.  Critical care was necessary to treat or prevent imminent or life-threatening deterioration.  Critical care was time spent personally by me on the following activities: development of treatment plan with patient and/or surrogate as well as nursing, discussions  with consultants, evaluation of patient's response  to treatment, examination of patient, obtaining history from patient or surrogate, ordering and performing treatments and interventions, ordering and review of laboratory studies, ordering and review of radiographic studies, pulse oximetry and re-evaluation of patient's condition.   Medications Ordered in ED Medications  sodium chloride flush (NS) 0.9 % injection 3 mL (has no administration in time range)  vancomycin (VANCOCIN) IVPB 1000 mg/200 mL premix (0 mg Intravenous Stopped 08/02/19 0221)  cefTRIAXone (ROCEPHIN) 1 g in sodium chloride 0.9 % 100 mL IVPB (0 g Intravenous Stopped 08/02/19 0209)    ED Course  I have reviewed the triage vital signs and the nursing notes.  Pertinent labs & imaging results that were available during my care of the patient were reviewed by me and considered in my medical decision making (see chart for details).    MDM Rules/Calculators/A&P                      Patient presents with ongoing infection left foot.  Sent in by his podiatrist with concerns for failure of outpatient antibiotics and deeper space infection.  He is clinically not ill-appearing and vital signs are reassuring.  He is afebrile.  He does not appear septic.  Patient is very frustrated and states multiple times that he does not plan to stay.  I have encouraged him to stay for definitive imaging and at least 1 dose of IV antibiotics.  Unfortunately, I do not have a frame of reference for his ongoing foot wounds although I defer to his podiatrist regarding worsening.  He clinically has evidence of cellulitis.  He has a clean-based ulceration.  Osteomyelitis is also consideration.  He has no significant leukocytosis.  Lactate minimally elevated at 2.1.  X-ray is largely reassuring.  MRI shows a small focal area of ulceration along the lateral aspect of the fifth metatarsal.  No acute osteo-, sinus tract, or abscess.  He has evidence of chronic osteo that  is mostly unchanged in the fourth metatarsal.  I discussed the results with the patient's.  He received vancomycin and Rocephin.  He is very tearful and states he has been under a lot of stress.  He reports that he has had a lot of medical problems and is feeling the strain emotionally and financially.  We discussed options including admission for IV antibiotics versus discharge and addition of doxycycline to cover for MRSA.  Patient wishes not to be admitted to the hospital.  I discussed with him the risk and benefits including worsening infection and possible loss of digits.  He stated understanding and reports that he would like to follow-up with Dr. Ardelle Anton.  Will discharge with doxycycline.  Have encouraged him to return for any new or worsening symptoms.  After history, exam, and medical workup I feel the patient has been appropriately medically screened and is safe for discharge home. Pertinent diagnoses were discussed with the patient. Patient was given return precautions.    Final Clinical Impression(s) / ED Diagnoses Final diagnoses:  Left foot infection  Cellulitis of left lower extremity    Rx / DC Orders ED Discharge Orders         Ordered    doxycycline (VIBRAMYCIN) 100 MG capsule  2 times daily     08/02/19 0412           Demetrice Combes, Mayer Masker, MD 08/02/19 [phone]

## 2019-08-02 NOTE — ED Notes (Signed)
Pt transported to MRI 

## 2019-08-02 NOTE — Telephone Encounter (Signed)
I have left him 2 VM to call back. No response.

## 2019-08-02 NOTE — ED Notes (Signed)
Pt back from xray without incidence 

## 2019-08-02 NOTE — ED Notes (Signed)
Pt back from MRI without incidence 

## 2019-08-02 NOTE — Telephone Encounter (Signed)
Pt called states no one has called him. I told pt he had called and I brought Dr. Ardelle Anton to the phone and pt was not on the line. I told pt that I would see if I could get Dr. Ardelle Anton to the line again. Dr. Ardelle Anton was in a room with a pt and I informed pt I would have Dr. Ardelle Anton call and pt hung up.

## 2019-08-02 NOTE — Discharge Instructions (Addendum)
You were seen today for ongoing infection of the left foot.  Your MRI does not show any evidence of osteomyelitis although you clinically have evidence of cellulitis.  You need to continue antibiotics.  You will be given doxycycline.  Discussed with your podiatrist ongoing care.  If you develop fevers, worsening redness, it is imperative that you return.

## 2019-08-02 NOTE — Telephone Encounter (Signed)
Pt states he was sent to the ED yesterday by Dr. Ardelle Anton, 12 hours there for blood work, x-rays and MRI and he would not let them admit him until he had spoken with DR. Wagoner.

## 2019-08-02 NOTE — Telephone Encounter (Signed)
Pt called again and I brought Dr. Ardelle Anton to my phone and pt was not on the line. Dr. Ardelle Anton called and left a message for pt to call [phone].

## 2019-08-04 ENCOUNTER — Encounter: Payer: Self-pay | Admitting: Podiatry

## 2019-08-04 ENCOUNTER — Other Ambulatory Visit: Payer: Self-pay

## 2019-08-04 ENCOUNTER — Ambulatory Visit (INDEPENDENT_AMBULATORY_CARE_PROVIDER_SITE_OTHER): Payer: BLUE CROSS/BLUE SHIELD | Admitting: Podiatry

## 2019-08-04 VITALS — Temp 98.0°F

## 2019-08-04 DIAGNOSIS — L02619 Cutaneous abscess of unspecified foot: Secondary | ICD-10-CM | POA: Diagnosis not present

## 2019-08-04 DIAGNOSIS — L97501 Non-pressure chronic ulcer of other part of unspecified foot limited to breakdown of skin: Secondary | ICD-10-CM | POA: Diagnosis not present

## 2019-08-04 DIAGNOSIS — L03119 Cellulitis of unspecified part of limb: Secondary | ICD-10-CM

## 2019-08-04 DIAGNOSIS — E1149 Type 2 diabetes mellitus with other diabetic neurological complication: Secondary | ICD-10-CM

## 2019-08-05 NOTE — Progress Notes (Signed)
Subjective: 54 year old male presents the office today for follow evaluation of left foot cellulitis, ulceration.  He states that he did not will be into the hospital due to multiple issues but he feels that the foot is getting better.  He has been changing the bandage daily denies any pus.  No red streaks.  The redness is much improved and he denies any systemic concerns including fevers, chills, nausea, vomiting.  He denies any calf pain, chest pain, shortness of breath.  Objective: AAO x3, NAD DP/PT pulses palpable bilaterally, CRT less than 3 seconds Ulceration present submetatarsal 5 on the left foot as well as to the dorsal lateral fifth metatarsal.  Is a granular nature and appear to be in fluid.  There is substantially improved erythema to the foot.  Minimal warmth but no ascending cellulitis.  There is no fluctuance or crepitation.  There is no malodor. Cavus foot type is present. No open lesions or pre-ulcerative lesions.  No pain with calf compression, swelling, warmth, erythema  Assessment: Ulceration of the left foot with improved cellulitis  Plan: -All treatment options discussed with the patient including all alternatives, risks, complications.  -Reviewed the ER records including MRI.  Will continue oral antibiotics for now.  Overall cellulitis is much improved.  He has no systemic symptoms of infection.  No clinical signs of an abscess.  Encouraged elevation.  Continue surgical shoe. -Monitor for any clinical signs or symptoms of infection and directed to call the office immediately should any occur or go to the ER. -Patient encouraged to call the office with any questions, concerns, change in symptoms.   Return in about 1 week (around 08/11/2019) for wound check .  Vivi Barrack DPM

## 2019-08-07 LAB — CULTURE, BLOOD (ROUTINE X 2)
Culture: NO GROWTH
Culture: NO GROWTH
Special Requests: ADEQUATE
Special Requests: ADEQUATE

## 2019-08-15 ENCOUNTER — Other Ambulatory Visit: Payer: Self-pay

## 2019-08-15 ENCOUNTER — Ambulatory Visit (INDEPENDENT_AMBULATORY_CARE_PROVIDER_SITE_OTHER): Payer: BLUE CROSS/BLUE SHIELD | Admitting: Podiatry

## 2019-08-15 ENCOUNTER — Encounter: Payer: Self-pay | Admitting: Podiatry

## 2019-08-15 VITALS — Temp 97.2°F | Resp 16

## 2019-08-15 DIAGNOSIS — L97501 Non-pressure chronic ulcer of other part of unspecified foot limited to breakdown of skin: Secondary | ICD-10-CM

## 2019-08-15 DIAGNOSIS — E1149 Type 2 diabetes mellitus with other diabetic neurological complication: Secondary | ICD-10-CM

## 2019-08-15 DIAGNOSIS — L97521 Non-pressure chronic ulcer of other part of left foot limited to breakdown of skin: Secondary | ICD-10-CM

## 2019-08-15 DIAGNOSIS — L02619 Cutaneous abscess of unspecified foot: Secondary | ICD-10-CM

## 2019-08-15 DIAGNOSIS — L03119 Cellulitis of unspecified part of limb: Secondary | ICD-10-CM | POA: Diagnosis not present

## 2019-08-17 NOTE — Progress Notes (Signed)
Subjective: 54 year old male presents the office today for follow evaluation of left foot cellulitis, ulceration.  Overall he states the wounds are getting better.  Denies any drainage or pus.  He has some Augmentin at home from previous phase asking if he should take.  He states the redness is improved and there is been no red streaks.  No pain.  No warmth.  Denies any fevers, chills, nausea, vomiting.  No calf pain, chest pain, shortness of breath.   Objective: AAO x3, NAD DP/PT pulses palpable bilaterally, CRT less than 3 seconds Ulceration present submetatarsal 5 with hyperkeratotic tissue along the periwound.  Also callus formation present fifth metatarsal head laterally.  No significant wound present the lateral aspect however there is still wound present after debridement measuring 1 x 0.5 cm submetatarsal 5.  There is no probing to bone, undermining or tunneling.  After debridement there is a granular wound base. Cavus foot type is present. No open lesions or pre-ulcerative lesions.  No pain with calf compression, swelling, warmth, erythema      Assessment: Ulceration of the left foot with improved cellulitis  Plan: -All treatment options discussed with the patient including all alternatives, risks, complications.  -Debrided the wound today utilizing the 312 with scalpel any complications.  Continue daily dressing changes.  Finish course of Augmentin that he had started at home.  Elevation.  Continue offloading shoe.  Ultimately he does not need new inserts. -Monitor for any clinical signs or symptoms of infection and directed to call the office immediately should any occur or go to the ER.  Return in about 2 weeks (around 08/29/2019) for wound check .  Vivi Barrack DPM

## 2019-08-30 ENCOUNTER — Other Ambulatory Visit: Payer: Self-pay

## 2019-08-30 ENCOUNTER — Ambulatory Visit (INDEPENDENT_AMBULATORY_CARE_PROVIDER_SITE_OTHER): Payer: BLUE CROSS/BLUE SHIELD | Admitting: Podiatry

## 2019-08-30 VITALS — Temp 97.2°F

## 2019-08-30 DIAGNOSIS — L97501 Non-pressure chronic ulcer of other part of unspecified foot limited to breakdown of skin: Secondary | ICD-10-CM | POA: Diagnosis not present

## 2019-08-30 DIAGNOSIS — E1149 Type 2 diabetes mellitus with other diabetic neurological complication: Secondary | ICD-10-CM

## 2019-08-30 NOTE — Patient Instructions (Signed)
Keep a small amount of medihoney on the wound daily. You can wash with soap and water. Monitor for any increase in redness, drainage, swelling or any signs of infection. If any changes please call me immediately.

## 2019-08-31 NOTE — Progress Notes (Signed)
Subjective: 54 year old male presents the office today for follow evaluation of left foot cellulitis, ulceration.  He states he is doing well.  The wound in the lateral aspect of the foot is healed.  The wound on bottom of the foot is getting better.  Denies any drainage or pus.  No increase in swelling or redness.  No pain.  Denies any fevers, chills, nausea, vomiting.  No calf pain, chest pain, shortness of breath.  Objective: AAO x3, NAD DP/PT pulses palpable bilaterally, CRT less than 3 seconds Ulceration present submetatarsal 5 with hyperkeratotic tissue along the periwound.  After debridement of the wound measures 0.9 x 0.3 x 0.1 cm there is no probing to bone, undermining or tunneling.  No surrounding edema, erythema, drainage or pus or any signs of infection.  The area of the lateral aspect of the foot is healed. Cavus foot type is present. No open lesions or pre-ulcerative lesions.  No pain with calf compression, swelling, warmth, erythema  Assessment: Ulceration of the left foot with resolved cellulitis  Plan: -All treatment options discussed with the patient including all alternatives, risks, complications.  -Debrided the wound today utilizing the 312 with scalpel any complications down to healthy, granular tissue.  Continue daily dressing changes with Medihoney.  Elevation.  Continue offloading shoe.  A surgical shoe was dispensed as he seems to be pointing outwards. -Monitor for any clinical signs or symptoms of infection and directed to call the office immediately should any occur or go to the ER.  Return in about 2 weeks (around 09/13/2019).  Vivi Barrack DPM

## 2019-09-12 ENCOUNTER — Other Ambulatory Visit: Payer: Self-pay

## 2019-09-12 ENCOUNTER — Ambulatory Visit (INDEPENDENT_AMBULATORY_CARE_PROVIDER_SITE_OTHER): Payer: BLUE CROSS/BLUE SHIELD | Admitting: Podiatry

## 2019-09-12 DIAGNOSIS — E1149 Type 2 diabetes mellitus with other diabetic neurological complication: Secondary | ICD-10-CM | POA: Diagnosis not present

## 2019-09-12 DIAGNOSIS — L02619 Cutaneous abscess of unspecified foot: Secondary | ICD-10-CM | POA: Diagnosis not present

## 2019-09-12 DIAGNOSIS — L03116 Cellulitis of left lower limb: Secondary | ICD-10-CM

## 2019-09-12 DIAGNOSIS — L97521 Non-pressure chronic ulcer of other part of left foot limited to breakdown of skin: Secondary | ICD-10-CM | POA: Diagnosis not present

## 2019-09-12 DIAGNOSIS — L97501 Non-pressure chronic ulcer of other part of unspecified foot limited to breakdown of skin: Secondary | ICD-10-CM

## 2019-09-12 MED ORDER — DOXYCYCLINE HYCLATE 100 MG PO TABS: 100.0000 mg | ORAL_TABLET | Freq: Two times a day (BID) | ORAL | 0 refills | Status: DC

## 2019-09-16 ENCOUNTER — Telehealth: Payer: Self-pay | Admitting: *Deleted

## 2019-09-16 ENCOUNTER — Other Ambulatory Visit: Payer: Self-pay | Admitting: Podiatry

## 2019-09-16 LAB — WOUND CULTURE
MICRO NUMBER:: 10352096
SPECIMEN QUALITY:: ADEQUATE

## 2019-09-16 MED ORDER — CIPROFLOXACIN HCL 500 MG PO TABS: 500.0000 mg | ORAL_TABLET | Freq: Two times a day (BID) | ORAL | 0 refills | Status: DC

## 2019-09-16 NOTE — Telephone Encounter (Signed)
-----   Message from Vivi Barrack, DPM sent at 09/16/2019  3:01 PM EDT ----- Val- please let him know that the culture did show fungus an I have added cipro to take as well.

## 2019-09-16 NOTE — Telephone Encounter (Signed)
Left message informing pt that due to the importance of the message I was leaving Dr. Gabriel Rung explanation of results and orders, I left message with Dr. Gabriel Rung 09/16/2019 3:01pm.

## 2019-09-19 ENCOUNTER — Ambulatory Visit (INDEPENDENT_AMBULATORY_CARE_PROVIDER_SITE_OTHER): Payer: BLUE CROSS/BLUE SHIELD | Admitting: Podiatry

## 2019-09-19 ENCOUNTER — Encounter: Payer: Self-pay | Admitting: Podiatry

## 2019-09-19 ENCOUNTER — Other Ambulatory Visit: Payer: Self-pay

## 2019-09-19 ENCOUNTER — Ambulatory Visit (INDEPENDENT_AMBULATORY_CARE_PROVIDER_SITE_OTHER): Payer: BLUE CROSS/BLUE SHIELD

## 2019-09-19 DIAGNOSIS — L02619 Cutaneous abscess of unspecified foot: Secondary | ICD-10-CM

## 2019-09-19 DIAGNOSIS — M778 Other enthesopathies, not elsewhere classified: Secondary | ICD-10-CM | POA: Diagnosis not present

## 2019-09-19 DIAGNOSIS — E1149 Type 2 diabetes mellitus with other diabetic neurological complication: Secondary | ICD-10-CM

## 2019-09-19 DIAGNOSIS — T1490XA Injury, unspecified, initial encounter: Secondary | ICD-10-CM

## 2019-09-19 DIAGNOSIS — L03119 Cellulitis of unspecified part of limb: Secondary | ICD-10-CM

## 2019-09-19 DIAGNOSIS — M779 Enthesopathy, unspecified: Secondary | ICD-10-CM

## 2019-09-19 DIAGNOSIS — S99912A Unspecified injury of left ankle, initial encounter: Secondary | ICD-10-CM

## 2019-09-19 DIAGNOSIS — L97501 Non-pressure chronic ulcer of other part of unspecified foot limited to breakdown of skin: Secondary | ICD-10-CM | POA: Diagnosis not present

## 2019-09-19 MED ORDER — AMOXICILLIN-POT CLAVULANATE 875-125 MG PO TABS: 1.0000 | ORAL_TABLET | Freq: Two times a day (BID) | ORAL | 0 refills | Status: DC

## 2019-09-19 MED ORDER — CIPROFLOXACIN HCL 500 MG PO TABS: 500.0000 mg | ORAL_TABLET | Freq: Two times a day (BID) | ORAL | 0 refills | Status: DC

## 2019-09-19 NOTE — Progress Notes (Signed)
Subjective: 54 year old male presents the office today for follow evaluation of left foot cellulitis, ulceration.  Overall he states he is doing okay.  He is having no previous no new concerns.  Denies any drainage or pus.  Continue daily dressing changes.  Denies any fevers, chills, nausea, vomiting.  No calf pain, chest pain, shortness of breath.   Objective: AAO x3, NAD DP/PT pulses palpable bilaterally, CRT less than 3 seconds Ulceration present submetatarsal 5 with hyperkeratotic tissue along the periwound.  After debridement of the wound measures about the same at 1 x 0.3 x 0.1 cm there is no probing to bone, undermining or tunneling.  There is mild surrounding edema and erythema but there is no ascending cellulitis.  There is no fluctuation or crepitation.  There is no malodor. Cavus foot type is present. No open lesions or pre-ulcerative lesions.  No pain with calf compression, swelling, warmth, erythema  Assessment: Ulceration of the left foot with localized erythema  Plan: -All treatment options discussed with the patient including all alternatives, risks, complications.  -Debrided the wound today utilizing the 312 with scalpel any complications down to healthy, granular tissue.  Wound culture was obtained today.  Prescribed doxycycline.  Continue daily dressing changes with Medihoney.  Elevation.  Continue offloading shoe.  A surgical shoe was dispensed as he seems to be pointing outwards. -Monitor for any clinical signs or symptoms of infection and directed to call the office immediately should any occur or go to the ER.  Return in about 2 weeks (around 09/26/2019).  Vivi Barrack DPM

## 2019-09-21 ENCOUNTER — Other Ambulatory Visit: Payer: Self-pay | Admitting: Podiatry

## 2019-09-21 DIAGNOSIS — M779 Enthesopathy, unspecified: Secondary | ICD-10-CM

## 2019-09-21 DIAGNOSIS — T1490XA Injury, unspecified, initial encounter: Secondary | ICD-10-CM

## 2019-09-27 ENCOUNTER — Ambulatory Visit: Payer: BLUE CROSS/BLUE SHIELD | Admitting: Podiatry

## 2019-09-30 ENCOUNTER — Ambulatory Visit (INDEPENDENT_AMBULATORY_CARE_PROVIDER_SITE_OTHER): Payer: BLUE CROSS/BLUE SHIELD | Admitting: Podiatry

## 2019-09-30 ENCOUNTER — Other Ambulatory Visit: Payer: Self-pay

## 2019-09-30 ENCOUNTER — Encounter: Payer: Self-pay | Admitting: Podiatry

## 2019-09-30 DIAGNOSIS — L97522 Non-pressure chronic ulcer of other part of left foot with fat layer exposed: Secondary | ICD-10-CM | POA: Diagnosis not present

## 2019-09-30 DIAGNOSIS — E1149 Type 2 diabetes mellitus with other diabetic neurological complication: Secondary | ICD-10-CM

## 2019-09-30 DIAGNOSIS — E08621 Diabetes mellitus due to underlying condition with foot ulcer: Secondary | ICD-10-CM

## 2019-09-30 NOTE — Progress Notes (Signed)
Subjective:  Patient ID: [name], male    DOB: May 15, 1966,  MRN: [id number]  Chief Complaint  Patient presents with  . Wound Check    pt is here for a wound check of the left foot, pt states that the left foot has been draining, and is concerned about possible infections to the left foot.    54 y.o. male presents for wound care.  Patient presents with 2 weeks follow-up of left submetatarsal 5 ulceration.  Patient is known to Dr. Ardelle Anton who has been actively treating the wound.  Patient has been taking his antibiotics regularly.  Patient is concerned about the his wound being possibly infected.  Patient states is been keeping it bandaged and clean dry and intact.  He also had some purulent drainage that he was worried about.  He has finished the course of his antibiotics.  He denies any other acute complaints.  He has been ambulating with a cam boot   Review of Systems: Negative except as noted in the HPI. Denies N/V/F/Ch.  Past Medical History:  Diagnosis Date  . ADHD (attention deficit hyperactivity disorder)   . Anxiety and depression   . Candida esophagitis (HCC)    in setting of DKA/hyperglycemia  . CIDP (chronic inflammatory demyelinating polyneuropathy) (HCC)   . Depression   . Diabetes type 2, controlled (HCC)   . Diverticulosis   . Essential hypertension 06/21/2014  . Hyperlipidemia   . Kidney stones   . Pancreatitis   . Stomach ulcer   . Vitamin D deficiency     Current Outpatient Medications:  .  acetaminophen (TYLENOL) 325 MG tablet, Take by mouth., Disp: , Rfl:  .  amoxicillin-clavulanate (AUGMENTIN) 875-125 MG tablet, Take 1 tablet by mouth 2 (two) times daily., Disp: 20 tablet, Rfl: 0 .  Amphetamine Sulfate (EVEKEO) 10 MG TABS, Take 20 mg by mouth 2 (two) times daily. , Disp: , Rfl:  .  aspirin 81 MG tablet, Take 1 tablet (81 mg total) by mouth daily., Disp: 30 tablet, Rfl: 0 .  betamethasone acetate-betamethasone sodium phosphate (CELESTONE) 6 (3-3)  MG/ML injection, , Disp: , Rfl:  .  buPROPion (WELLBUTRIN XL) 150 MG 24 hr tablet, , Disp: , Rfl: 0 .  calcium carbonate (OS-CAL) 1250 (500 Ca) MG chewable tablet, Chew by mouth., Disp: , Rfl:  .  calcium carbonate (TUMS) 500 MG chewable tablet, Chew 1 tablet by mouth daily as needed., Disp: , Rfl:  .  ciprofloxacin (CIPRO) 500 MG tablet, Take 1 tablet (500 mg total) by mouth 2 (two) times daily., Disp: 20 tablet, Rfl: 0 .  Continuous Blood Gluc Receiver (FREESTYLE LIBRE 14 DAY READER) DEVI, every 14 (fourteen) days. , Disp: , Rfl:  .  Continuous Blood Gluc Sensor (FREESTYLE LIBRE 14 DAY SENSOR) MISC, , Disp: , Rfl: 2 .  DULoxetine (CYMBALTA) 60 MG capsule, Take 60 mg by mouth daily., Disp: , Rfl: 10 .  fenofibrate 160 MG tablet, Take 160 mg by mouth daily., Disp: , Rfl:  .  fluconazole (DIFLUCAN) 100 MG tablet, Take 2 tablets by mouth on day 1 then take 1 tablet daily until gone, Disp: 22 tablet, Rfl: 0 .  gabapentin (NEURONTIN) 800 MG tablet, Take 800 mg by mouth 3 (three) times daily., Disp: , Rfl:  .  HYDROcodone-acetaminophen (NORCO/VICODIN) 5-325 MG tablet, , Disp: , Rfl:  .  insulin aspart (NOVOLOG FLEXPEN) 100 UNIT/ML FlexPen, Inject 22-32 Units into the skin See admin instructions. Inject 22-32 units into the skin three  to four times a day before meals, per sliding scale, Disp: , Rfl:  .  Insulin Glargine (LANTUS SOLOSTAR) 100 UNIT/ML Solostar Pen, Inject 70 Units into the skin at bedtime., Disp: , Rfl:  .  insulin NPH-regular Human (NOVOLIN 70/30) (70-30) 100 UNIT/ML injection, Insulin NPH 40 units in am and 30 Units at night time., Disp: 100 mL, Rfl: 1 .  morphine (MSIR) 15 MG tablet, SMARTSIG:0.5 Tablet(s) By Mouth Every 4 Hours, Disp: , Rfl:  .  oxyCODONE-acetaminophen (PERCOCET/ROXICET) 5-325 MG tablet, Take by mouth., Disp: , Rfl:  .  pantoprazole (PROTONIX) 40 MG tablet, Take 1 tablet (40 mg total) by mouth daily before breakfast., Disp: 30 tablet, Rfl: 0 .  sildenafil (REVATIO) 20  MG tablet, Take 3 tablets by mouth one hour prior to sex as needed. Do not take more than one dose every 24 hours., Disp: , Rfl:  .  TECHLITE PEN NEEDLES 31G X 8 MM MISC, USE TO GIVE INSULIN 4 TIMES DAILY, Disp: , Rfl:  .  zolpidem (AMBIEN) 10 MG tablet, Take 10 mg by mouth at bedtime as needed for sleep. , Disp: , Rfl:  .  lamoTRIgine (LAMICTAL) 150 MG tablet, Take 1 tablet by mouth daily., Disp: , Rfl:   Social History   Tobacco Use  Smoking Status Former Smoker  Smokeless Tobacco Former Neurosurgeon    Allergies  Allergen Reactions  . Lithium Other (See Comments)    "Made me feel like I was walking in chest-deep water. I didn't like it."  . Adderall [Amphetamine-Dextroamphetamine] Palpitations and Other (See Comments)    Hyperactivity, also  . Ritalin [Methylphenidate] Palpitations and Other (See Comments)    Hyperactivity, also   Objective:  There were no vitals filed for this visit. There is no height or weight on file to calculate BMI. Constitutional Well developed. Well nourished.  Vascular Dorsalis pedis pulses palpable bilaterally. Posterior tibial pulses palpable bilaterally. Capillary refill normal to all digits.  No cyanosis or clubbing noted. Pedal hair growth normal.  Neurologic Normal speech. Oriented to person, place, and time. Protective sensation absent  Dermatologic Wound Location: Left submetatarsal 5 head wound does not probe to bone.  Fat layer exposed.  No erythema noted.  No malodor present. Wound Base: Mixed Granular/Fibrotic Peri-wound: Normal Exudate: Moderate amount Serosanguinous exudate Wound Measurements: -See below  Orthopedic: No pain to palpation either foot.   Radiographs: None Assessment:   1. Type II diabetes mellitus with neurological manifestations (HCC)   2. 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  Ulcer right submetatarsal 5 fat layer exposed -Debridement as below. -Dressed with Betadine wet-to-dry, DSD.  Patient was doing saline dressing which led to maceration therefore I have asked him to change to Betadine wet-to-dry dressing changes for now -Continue off-loading with cam boot. -Patient will follow up with Dr. Ardelle Anton for continuous wound care.  Procedure: Excisional Debridement of Wound Tool: Sharp chisel blade/tissue nipper Rationale: Removal of non-viable soft tissue from the wound to promote healing.  Anesthesia: none Pre-Debridement Wound Measurements: 1 cm x 0.3 point cm x 0.2 cm  Post-Debridement Wound Measurements: Same Type of Debridement: Sharp Excisional Tissue Removed: Non-viable soft tissue Blood loss: Minimal (<50cc) Depth of Debridement: subcutaneous tissue. Technique: Sharp excisional debridement to bleeding, viable wound base.  Wound Progress: It appears to be decreasing in size slowly but  has increased in depth. Site healing conversation 7 Dressing: Dry, sterile, compression dressing. Disposition: Patient tolerated procedure well. Patient to return in 1 week for follow-up.  No follow-ups on file.

## 2019-10-04 NOTE — Progress Notes (Signed)
Subjective: 54 year old male presents the office today for follow evaluation of left foot cellulitis, ulceration.  He states that he did fall out of bed last Monday twisting his left foot and ankle.  He also states is increased warmth of the foot today but he also just recently got back from [place].  Had some swelling but able to ambulate.  He has still been on antibiotics.  Is been changing the bandage daily. Denies any fevers, chills, nausea, vomiting.  No calf pain, chest pain, shortness of breath.   Objective: AAO x3, NAD DP/PT pulses palpable bilaterally, CRT less than 3 seconds Ulceration present submetatarsal 5 with hyperkeratotic tissue along the periwound.  After debridement of the wound measures larger 1.5 x 1.5  cm after debridement.  There is hyperkeratotic on the periwound as well as macerated tissue.  There is no probing to bone, undermining or tunneling.  There is mild surrounding erythema but there is no ascending cellulitis there is no fluctuation crepitation.  There is some discomfort of the ankle minimal swelling to the ankle.  No proximal tib-fib pain.  No pain of the foot otherwise. Cavus foot type is present. No open lesions or pre-ulcerative lesions.  No pain with calf compression, swelling, warmth, erythema  Assessment: Ulceration of the left foot with localized erythema; ankle injury left side  Plan: -All treatment options discussed with the patient including all alternatives, risks, complications.  -Debrided the wound today utilizing the 312 with scalpel any complications down to healthy, granular tissue.  Continue offloading.  Augmentin.  Elevation.  Continue cam boot. -Advised him he needs to stay off the foot.  He has been working he recently just traveled to [place] and he states his upcoming trip to [place].  I am hesitant for him to do this. -Regards to the ankle injury remain in cam boot.  Elevation.  Return in about 1 week (around 09/26/2019).  Vivi Barrack DPM

## 2019-10-11 ENCOUNTER — Other Ambulatory Visit: Payer: Self-pay

## 2019-10-11 ENCOUNTER — Ambulatory Visit (INDEPENDENT_AMBULATORY_CARE_PROVIDER_SITE_OTHER): Payer: BLUE CROSS/BLUE SHIELD | Admitting: Podiatry

## 2019-10-11 DIAGNOSIS — E08621 Diabetes mellitus due to underlying condition with foot ulcer: Secondary | ICD-10-CM | POA: Diagnosis not present

## 2019-10-11 DIAGNOSIS — L97401 Non-pressure chronic ulcer of unspecified heel and midfoot limited to breakdown of skin: Secondary | ICD-10-CM

## 2019-10-11 DIAGNOSIS — E1149 Type 2 diabetes mellitus with other diabetic neurological complication: Secondary | ICD-10-CM | POA: Diagnosis not present

## 2019-10-11 MED ORDER — SULFAMETHOXAZOLE-TRIMETHOPRIM 800-160 MG PO TABS: 1.0000 | ORAL_TABLET | Freq: Two times a day (BID) | ORAL | 1 refills | Status: DC

## 2019-10-12 NOTE — Progress Notes (Signed)
Subjective: 54 year old male presents the office today for follow evaluation of left foot cellulitis, ulceration.  He states he thinks the wound is healing.  He thinks it is more superficial with the skin.  He has not seen any drainage or pus or any increase in swelling or redness.  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  He had to wear regular shoes for this. Denies any fevers, chills, nausea, vomiting.  No calf pain, chest pain, shortness of breath.   Objective: AAO x3, NAD DP/PT pulses palpable bilaterally, CRT less than 3 seconds Ulceration present submetatarsal 5 with hyperkeratotic tissue along the periwound.  After debridement the wound measured 1.6 x 1.5 and a superficial measure about 0.1 cm.  There is no surrounding erythema, ascending cellulitis there is no fluctuation or rotation.  There is no malodor.  Cavus foot type is present with problems the fifth metatarsal head plantarly. Cavus foot type is present.  No pain with ankle today and there is no edema No open lesions or pre-ulcerative lesions.  No pain with calf compression, swelling, warmth, erythema  Assessment: Ulceration of the left foot with localized erythema  Plan: -All treatment options discussed with the patient including all alternatives, risks, complications.  -Debrided the wound today utilizing the 312 with scalpel any complications down to healthy, granular tissue.  Wound to try to order dressings today through [facility]. Ordered a calcium alginate with silver dressing.  -Continue with CAM boot and stay off of the foot.  -Monitor for any clinical signs or symptoms of infection and directed to call the office immediately should any occur or go to the ER.  Return in about 1 week  Vivi Barrack DPM

## 2019-10-18 ENCOUNTER — Telehealth: Payer: Self-pay | Admitting: *Deleted

## 2019-10-18 DIAGNOSIS — E08621 Diabetes mellitus due to underlying condition with foot ulcer: Secondary | ICD-10-CM

## 2019-10-18 DIAGNOSIS — L97401 Non-pressure chronic ulcer of unspecified heel and midfoot limited to breakdown of skin: Secondary | ICD-10-CM

## 2019-10-18 NOTE — Telephone Encounter (Signed)
Left message infroming pt he should cut the calcium alginate to fit the wound and cover with a dry dressing either tape or roll gauze in place, and if he felt he could use a knee scooter I could order, but the offloading boot was to allow him to only perform ADL and if he was doing more than getting up and going to the bathroom or fixing a snack and sitting back down he was not getting the full benefit of the offloading boot. Asked pt to call if he would like the knee scooter.

## 2019-10-18 NOTE — Telephone Encounter (Signed)
Pt states he received the dressing Dr. Ardelle Anton had ordered and was not sure how to use, the boot he was given is causing him to fall he has fallen 3 times in one week.

## 2019-10-19 NOTE — Telephone Encounter (Signed)
I think a knee scooter would be a good idea if you could please order it. He has been on his foot a lot which is part of the reason it has not healed. That will help take the pressure off hopefully.

## 2019-10-20 NOTE — Telephone Encounter (Signed)
Left message informing pt I had left a message on his voicemail the day 10/18/2019 he had called with the wound care dressing instructions and to call if he wanted to try the knee scooter, and I had sent the order for the knee scooter to [facility] as requested by Dr. Ardelle Anton.

## 2019-10-20 NOTE — Telephone Encounter (Signed)
Pt called yesterday requesting instructions for dressing changes and options for offloading foot. I had left a message with this information on 10/18/2019 pt's voicemail.

## 2019-10-20 NOTE — Telephone Encounter (Signed)
Left message informing pt of Dr. Gabriel Rung orders and faxed and required form, clinicals and demographics to [facility], and emailed.

## 2019-10-20 NOTE — Telephone Encounter (Signed)
I called the patient as well but no answer. Left VM to call back. If he calls back please come get me and I will talk to him. Thanks.

## 2019-11-01 ENCOUNTER — Other Ambulatory Visit: Payer: Self-pay

## 2019-11-01 ENCOUNTER — Ambulatory Visit (INDEPENDENT_AMBULATORY_CARE_PROVIDER_SITE_OTHER): Payer: BLUE CROSS/BLUE SHIELD | Admitting: Podiatry

## 2019-11-01 DIAGNOSIS — L97502 Non-pressure chronic ulcer of other part of unspecified foot with fat layer exposed: Secondary | ICD-10-CM | POA: Diagnosis not present

## 2019-11-01 DIAGNOSIS — E1149 Type 2 diabetes mellitus with other diabetic neurological complication: Secondary | ICD-10-CM

## 2019-11-07 ENCOUNTER — Other Ambulatory Visit: Payer: Self-pay

## 2019-11-07 ENCOUNTER — Ambulatory Visit (INDEPENDENT_AMBULATORY_CARE_PROVIDER_SITE_OTHER): Payer: BLUE CROSS/BLUE SHIELD | Admitting: Podiatry

## 2019-11-07 ENCOUNTER — Ambulatory Visit (INDEPENDENT_AMBULATORY_CARE_PROVIDER_SITE_OTHER): Payer: BLUE CROSS/BLUE SHIELD

## 2019-11-07 DIAGNOSIS — L97512 Non-pressure chronic ulcer of other part of right foot with fat layer exposed: Secondary | ICD-10-CM | POA: Diagnosis not present

## 2019-11-07 DIAGNOSIS — E1149 Type 2 diabetes mellitus with other diabetic neurological complication: Secondary | ICD-10-CM | POA: Diagnosis not present

## 2019-11-07 DIAGNOSIS — M79672 Pain in left foot: Secondary | ICD-10-CM | POA: Diagnosis not present

## 2019-11-07 DIAGNOSIS — G600 Hereditary motor and sensory neuropathy: Secondary | ICD-10-CM

## 2019-11-07 MED ORDER — SULFAMETHOXAZOLE-TRIMETHOPRIM 800-160 MG PO TABS: 1.0000 | ORAL_TABLET | Freq: Two times a day (BID) | ORAL | 0 refills | Status: DC

## 2019-11-08 NOTE — Progress Notes (Signed)
Subjective: 54 year old male presents the office today for follow evaluation of left foot cellulitis, ulceration.  He has been getting some ankle pain possibly due to the boot, surgical shoe.  He is not sure if the wound is improving or not.  Denies any drainage or pus or any swelling or redness to his foot.  No pain. Denies any fevers, chills, nausea, vomiting.  No calf pain, chest pain, shortness of breath.   Objective: AAO x3, NAD DP/PT pulses palpable bilaterally, CRT less than 3 seconds Ulceration present submetatarsal 5 with hyperkeratotic tissue along the periwound.  After debridement the wound measured 1.5 x 1.5 x 0.1 cm. There is no surrounding erythema, ascending cellulitis there is no fluctuation or crepitation.  There is no malodor.  Cavus foot type is present with problems the fifth metatarsal head plantarly. Cavus foot type is present.  No pain with ankle today and there is no edema No open lesions or pre-ulcerative lesions.  No pain with calf compression, swelling, warmth, erythema  Assessment: Ulceration of the left foot with localized erythema  Plan: -All treatment options discussed with the patient including all alternatives, risks, complications.  -Debrided the wound today utilizing the 312 with scalpel any complications down to healthy, granular tissue.  Wound to try to order dressings today through [facility].  Silver alginate dressing daily.  Continue offloading.  Discussed treatment options further this point with conservative as well as surgical.  We will try for total contact cast on Monday. -Continue with CAM boot and stay off of the foot.  -Monitor for any clinical signs or symptoms of infection and directed to call the office immediately should any occur or go to the ER.  Return in about 1 week  Vivi Barrack DPM

## 2019-11-08 NOTE — Progress Notes (Signed)
Subjective: 54 year old male with history of type 2 diabetes with neuropathy, Charcot-Marie-Tooth presents the office today for follow evaluation of left foot cellulitis, ulceration.  He states the wound may be somewhat deeper.  He is becoming frustrated with the wound still.  This started on January 26 - first saw him for the wound.  He has fluctuated in size.  I previously had recommended admission to the hospital and he has declined.  He did go to emergency room at one point for IV biotics which was helpful but did not agree to admission.  He is still smoking on his feet quite a bit with work otherwise he tries to stay off of his foot.  He is frustrated about the wound still being present and not nonsteroidals biking, active.  When he goes on antibiotics he seems to do well but once he is off of them it seems that cellulitis reoccurs.  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  Denies any fevers, chills, nausea, vomiting.  No calf pain, chest pain, shortness of breath.   Objective: AAO x3, NAD DP/PT pulses palpable bilaterally, CRT less than 3 seconds Ulceration present submetatarsal 5 with hyperkeratotic tissue along the periwound.  After debridement the wound measured 1.7 x 1.5 x 0.3 cm.  There is surrounding erythema and mild warmth of the foot.  There is no fluctuance or crepitation.  No malodor.  There is no other ulcerations identified today.  Cavus foot type.  No pain with calf compression, swelling, warmth, erythema    Assessment: Ulceration of the left foot with localized erythema  Plan: -All treatment options discussed with the patient including all alternatives, risks, complications.  -X-rays obtained reviewed. There is no evidence of acute osteomyelitis or soft tissue emphysema identified today and evaluate presumably unchanged compared to previous -Debrided  the wound today utilizing the 312 with scalpel any complications down to healthy, granular tissue.  -Given the erythema I am going to restart antibiotics.  Prescribed Bactrim and referred to infectious disease. -Referral to the wound care center. -We originally willing to do a total contact cast today unfortunately the supplies do not come any time.  Consider doing this next Monday possibly. -Again encouraged elevation dressing after this looks possible.  A long discussion regards to etiology.  Unfortunately with a cavus foot type, CMT neuropathy as well as diabetes wound has been slow to heal given activity level as well.  We have attempted numerous offloading devices and I did dispense a surgical shoe with offloading that was made by our pedorthotist today. He has palpable pulses.  I discussed him possibly fifth metatarsal head excision today as well as previously and he does not want to hold off on this. -Monitor for any clinical signs or symptoms of infection and directed to call the office immediately should any occur or go to the ER.  Return in about 1 week  Vivi Barrack DPM

## 2019-11-10 ENCOUNTER — Telehealth: Payer: Self-pay | Admitting: *Deleted

## 2019-11-10 DIAGNOSIS — E08621 Diabetes mellitus due to underlying condition with foot ulcer: Secondary | ICD-10-CM

## 2019-11-10 DIAGNOSIS — E1149 Type 2 diabetes mellitus with other diabetic neurological complication: Secondary | ICD-10-CM

## 2019-11-10 DIAGNOSIS — G600 Hereditary motor and sensory neuropathy: Secondary | ICD-10-CM

## 2019-11-10 DIAGNOSIS — L97512 Non-pressure chronic ulcer of other part of right foot with fat layer exposed: Secondary | ICD-10-CM

## 2019-11-10 DIAGNOSIS — L97502 Non-pressure chronic ulcer of other part of unspecified foot with fat layer exposed: Secondary | ICD-10-CM

## 2019-11-10 NOTE — Telephone Encounter (Signed)
-----   Message from Vivi Barrack, DPM sent at 11/08/2019  2:58 PM EDT ----- Can you please refer to ID and the wound care center? Thanks.

## 2019-11-10 NOTE — Telephone Encounter (Signed)
Faxed required forms, clinicals and demographics to [facility].

## 2019-11-14 ENCOUNTER — Ambulatory Visit: Payer: BLUE CROSS/BLUE SHIELD | Admitting: Podiatry

## 2019-11-15 ENCOUNTER — Ambulatory Visit (INDEPENDENT_AMBULATORY_CARE_PROVIDER_SITE_OTHER): Payer: BLUE CROSS/BLUE SHIELD | Admitting: Internal Medicine

## 2019-11-15 ENCOUNTER — Encounter: Payer: Self-pay | Admitting: Internal Medicine

## 2019-11-15 ENCOUNTER — Other Ambulatory Visit: Payer: Self-pay

## 2019-11-15 DIAGNOSIS — L97522 Non-pressure chronic ulcer of other part of left foot with fat layer exposed: Secondary | ICD-10-CM

## 2019-11-15 NOTE — Progress Notes (Signed)
[facility]      Reason for Consult: left foot ulcer    Referring Physician: Dr. Ardelle Anton    Patient ID: [name], male    DOB: [birth year], 54 y.o.   MRN: [id number]  HPI:   Here for evaluation of his foot ulcer and concern for infection.  I previously treated him for osteomyelitis of the same foot with IV ampicillin/sulbactam along with hyperbaric treatments and other wound care and he had successfully closed the wound.  He though earlier this year developed a new ulcer at the 1st MTP and has been poorly healing.  He was started on Bactrim by Dr. Ardelle Anton for concern for infection and sent her.  Xray per Dr. Ardelle Anton with no osteomyelitis concerns.  He is scheduled with wound care later this month. He has intermittent swelling of his left leg and pain.  No drainage noted.   Last Hgb A1c over 9 by his report.  Has gained some weight.    Past Medical History:  Diagnosis Date  . ADHD (attention deficit hyperactivity disorder)   . Anxiety and depression   . Candida esophagitis (HCC)    in setting of DKA/hyperglycemia  . CIDP (chronic inflammatory demyelinating polyneuropathy) (HCC)   . Depression   . Diabetes type 2, controlled (HCC)   . Diverticulosis   . Essential hypertension 06/21/2014  . Hyperlipidemia   . Kidney stones   . Pancreatitis   . Stomach ulcer   . Vitamin D deficiency     Prior to Admission medications   Medication Sig Start Date End Date Taking? Authorizing Provider  acetaminophen (TYLENOL) 325 MG tablet Take by mouth.   Yes [provider]  amoxicillin-clavulanate (AUGMENTIN) 875-125 MG tablet Take 1 tablet by mouth 2 (two) times daily. 09/19/19  Yes Vivi Barrack, DPM  Amphetamine Sulfate (EVEKEO) 10 MG TABS Take 20 mg by mouth 2 (two) times daily.    Yes [provider]  aspirin 81 MG tablet Take 1 tablet (81 mg total) by mouth daily. 07/17/17  Yes Berton Mount I, MD  betamethasone  acetate-betamethasone sodium phosphate (CELESTONE) 6 (3-3) MG/ML injection  05/19/19  Yes [provider]  buPROPion (WELLBUTRIN XL) 150 MG 24 hr tablet  04/01/18  Yes [provider]  calcium carbonate (OS-CAL) 1250 (500 Ca) MG chewable tablet Chew by mouth.   Yes [provider]  calcium carbonate (TUMS) 500 MG chewable tablet Chew 1 tablet by mouth daily as needed.   Yes [provider]  ciprofloxacin (CIPRO) 500 MG tablet Take 1 tablet (500 mg total) by mouth 2 (two) times daily. 09/19/19  Yes Vivi Barrack, DPM  Continuous Blood Gluc Receiver (FREESTYLE LIBRE 14 DAY READER) DEVI every 14 (fourteen) days.  01/05/18  Yes [provider]  Continuous Blood Gluc Sensor (FREESTYLE LIBRE 14 DAY SENSOR) MISC  04/14/18  Yes [provider]  DULoxetine (CYMBALTA) 60 MG capsule Take 60 mg by mouth daily. 02/26/18  Yes [provider]  fenofibrate 160 MG tablet Take 160 mg by mouth daily.   Yes [provider]  fluconazole (DIFLUCAN) 100 MG tablet Take 2 tablets by mouth on day 1 then take 1 tablet daily until gone 07/22/17  Yes [name], MD  gabapentin (NEURONTIN) 800 MG tablet Take 800 mg by mouth 3 (three) times daily.   Yes [provider]  HYDROcodone-acetaminophen (NORCO/VICODIN) 5-325 MG tablet  03/22/19  Yes [provider]  insulin aspart (  NOVOLOG FLEXPEN) 100 UNIT/ML FlexPen Inject 22-32 Units into the skin See admin instructions. Inject 22-32 units into the skin three to four times a day before meals, per sliding scale   Yes [provider]  Insulin Glargine (LANTUS SOLOSTAR) 100 UNIT/ML Solostar Pen Inject 70 Units into the skin at bedtime.   Yes [provider]  insulin NPH-regular Human (NOVOLIN 70/30) (70-30) 100 UNIT/ML injection Insulin NPH 40 units in am and 30 Units at night time. 07/16/17  Yes Berton Mount I, MD  morphine (MSIR) 15 MG tablet SMARTSIG:0.5 Tablet(s) By  Mouth Every 4 Hours 02/13/19  Yes [provider]  oxyCODONE-acetaminophen (PERCOCET/ROXICET) 5-325 MG tablet Take by mouth. 07/25/19  Yes [provider]  pantoprazole (PROTONIX) 40 MG tablet Take 1 tablet (40 mg total) by mouth daily before breakfast. 07/17/17  Yes Barnetta Chapel, MD  sildenafil (REVATIO) 20 MG tablet Take 3 tablets by mouth one hour prior to sex as needed. Do not take more than one dose every 24 hours. 08/20/18  Yes [provider]  sulfamethoxazole-trimethoprim (BACTRIM DS) 800-160 MG tablet Take 1 tablet by mouth 2 (two) times daily. 10/11/19  Yes Vivi Barrack, DPM  sulfamethoxazole-trimethoprim (BACTRIM DS) 800-160 MG tablet Take 1 tablet by mouth 2 (two) times daily. 11/07/19  Yes Vivi Barrack, DPM  TECHLITE PEN NEEDLES 31G X 8 MM MISC USE TO GIVE INSULIN 4 TIMES DAILY 09/01/19  Yes [provider]  zolpidem (AMBIEN) 10 MG tablet Take 10 mg by mouth at bedtime as needed for sleep.    Yes [provider]  lamoTRIgine (LAMICTAL) 150 MG tablet Take 1 tablet by mouth daily. 05/12/18 06/11/18  [provider]    Allergies  Allergen Reactions  . Lithium Other (See Comments)    "Made me feel like I was walking in chest-deep water. I didn't like it."  . Adderall [Amphetamine-Dextroamphetamine] Palpitations and Other (See Comments)    Hyperactivity, also  . Ritalin [Methylphenidate] Palpitations and Other (See Comments)    Hyperactivity, also    Social History   Tobacco Use  . Smoking status: Former Games developer  . Smokeless tobacco: Former Engineer, water Use Topics  . Alcohol use: Yes    Comment: Socially  . Drug use: No    Family History  Problem Relation Age of Onset  . Stroke Father   . Breast cancer Mother   . Colon cancer Neg Hx     Review of Systems  Constitutional: negative for fevers and chills Gastrointestinal: negative for nausea and diarrhea Integument/breast: negative for rash All other  systems reviewed and are negative    Constitutional: in no apparent distress  Vitals:   11/15/19 0903  BP: 130/81  Pulse: 82  Temp: 98.1 F (36.7 C)   EYES: anicteric Cardiovascular: Cor RRR Respiratory: clear GI: Bowel sounds are normal, liver is not enlarged, spleen is not enlarged Musculoskeletal: his left foot with an ulcer 1.7 x 1.5 cm; no surrounding erythema, no drainage, no exposed bone.   Skin: negatives: no rash Neuro: non-focal  Labs: Lab Results  Component Value Date   WBC 7.5 08/01/2019   HGB 13.3 08/01/2019   HCT 39.9 08/01/2019   MCV 89.1 08/01/2019   PLT 301 08/01/2019    Lab Results  Component Value Date   CREATININE 1.10 08/01/2019   BUN 9 08/01/2019   NA 134 (L) 08/01/2019   K 4.2 08/01/2019   CL 101 08/01/2019   CO2 25 08/01/2019  Lab Results  Component Value Date   ALT 21 08/01/2019   AST 19 08/01/2019   ALKPHOS 96 08/01/2019   BILITOT 0.5 08/01/2019   INR 1.01 03/25/2018     Assessment: ulcer with fat exposed.  Foot examined and no signs of infection.  Additionally, I reviewed the xray and no sings of lytic lesion on lateral view.  No exposed bone.  I discussed with him there are no infectious issues and no indication for antibiotic treatment.    Plan: 1) stop antibiotics Continue with wound care Continued efforts with diabetes care Follow up as needed.  Thanks for referral

## 2019-11-16 ENCOUNTER — Ambulatory Visit: Payer: BLUE CROSS/BLUE SHIELD | Admitting: Podiatry

## 2019-11-21 ENCOUNTER — Telehealth: Payer: Self-pay | Admitting: *Deleted

## 2019-11-21 MED ORDER — SULFAMETHOXAZOLE-TRIMETHOPRIM 800-160 MG PO TABS: 1.0000 | ORAL_TABLET | Freq: Two times a day (BID) | ORAL | 0 refills | Status: DC

## 2019-11-21 NOTE — Telephone Encounter (Signed)
Pt states the fever has gone down with tylenol but would like and antibiotic to be safe. I offered to have a scheduler call to get him and appt for tomorrow and I would ask Dr. Ardelle Anton for antibiotic orders.

## 2019-11-21 NOTE — Telephone Encounter (Signed)
Pt called states he is running a low grade fever is out of the antibiotic and doesn't want to go to the ED, wants a refill.

## 2019-11-21 NOTE — Telephone Encounter (Signed)
Dr. Ardelle Anton ordered Bactrim DS #14 one bid, and to make sure he went to the wound care center tomorrow and we would be happy to see him too. Pt states he will definitely go to the wound care center appt and may just come on in here too.

## 2019-11-22 ENCOUNTER — Encounter (HOSPITAL_BASED_OUTPATIENT_CLINIC_OR_DEPARTMENT_OTHER): Payer: BLUE CROSS/BLUE SHIELD | Attending: Internal Medicine | Admitting: Internal Medicine

## 2019-11-22 ENCOUNTER — Encounter: Payer: Self-pay | Admitting: Podiatry

## 2019-11-22 ENCOUNTER — Ambulatory Visit (INDEPENDENT_AMBULATORY_CARE_PROVIDER_SITE_OTHER): Payer: BLUE CROSS/BLUE SHIELD | Admitting: Podiatry

## 2019-11-22 ENCOUNTER — Other Ambulatory Visit: Payer: Self-pay

## 2019-11-22 DIAGNOSIS — L97502 Non-pressure chronic ulcer of other part of unspecified foot with fat layer exposed: Secondary | ICD-10-CM

## 2019-11-22 DIAGNOSIS — I10 Essential (primary) hypertension: Secondary | ICD-10-CM | POA: Insufficient documentation

## 2019-11-22 DIAGNOSIS — Z794 Long term (current) use of insulin: Secondary | ICD-10-CM | POA: Diagnosis not present

## 2019-11-22 DIAGNOSIS — G6289 Other specified polyneuropathies: Secondary | ICD-10-CM | POA: Insufficient documentation

## 2019-11-22 DIAGNOSIS — G600 Hereditary motor and sensory neuropathy: Secondary | ICD-10-CM | POA: Insufficient documentation

## 2019-11-22 DIAGNOSIS — E1065 Type 1 diabetes mellitus with hyperglycemia: Secondary | ICD-10-CM | POA: Insufficient documentation

## 2019-11-22 DIAGNOSIS — E10621 Type 1 diabetes mellitus with foot ulcer: Secondary | ICD-10-CM | POA: Diagnosis not present

## 2019-11-22 DIAGNOSIS — E1042 Type 1 diabetes mellitus with diabetic polyneuropathy: Secondary | ICD-10-CM | POA: Diagnosis not present

## 2019-11-22 DIAGNOSIS — L97528 Non-pressure chronic ulcer of other part of left foot with other specified severity: Secondary | ICD-10-CM | POA: Diagnosis not present

## 2019-11-22 DIAGNOSIS — E1149 Type 2 diabetes mellitus with other diabetic neurological complication: Secondary | ICD-10-CM

## 2019-11-22 DIAGNOSIS — E1069 Type 1 diabetes mellitus with other specified complication: Secondary | ICD-10-CM | POA: Diagnosis not present

## 2019-11-22 DIAGNOSIS — G6181 Chronic inflammatory demyelinating polyneuritis: Secondary | ICD-10-CM | POA: Diagnosis not present

## 2019-11-22 NOTE — Telephone Encounter (Signed)
He saw ID and stopped antibiotics. Will do a short course of antibiotics for now give symptoms but if symptoms worsen he needs to go to the ER. Also has appointment with wound care Tuesday as well that he needs to go to.

## 2019-11-22 NOTE — Progress Notes (Signed)
LISA, BLAKEMAN ([id number]) Visit Report for 11/22/2019 Abuse/Suicide Risk Screen Details Patient Name: Date of Service: [facility], [place] 11/22/2019 7:30 A M Medical Record Number: [id number] Patient Account Number: [id number] Date of Birth/Sex: Treating RN: 12-Apr-1966 (54 y.o. [name] Primary Care Marshall Roehrich: Ananias Pilgrim Other Clinician: Referring Dhalia Zingaro: Treating Jerra Huckeby/Extender: [name], [name] in Treatment: 0 Abuse/Suicide Risk Screen Items Answer ABUSE RISK SCREEN: Has anyone close to you tried to hurt or harm you recentlyo No Do you feel uncomfortable with anyone in your familyo No Has anyone forced you do things that you didnt want to doo No Electronic Signature(s) Signed: 11/22/2019 5:28:52 PM By: Zenaida Deed RN, BSN Entered By: Zenaida Deed on 11/22/2019 07:57:28 -------------------------------------------------------------------------------- Activities of Daily Living Details Patient Name: Date of Service: [facility], [name] 11/22/2019 7:30 A M Medical Record Number: [id number] Patient Account Number: [id number] Date of Birth/Sex: Treating RN: 27-Dec-1965 (54 y.o. [name] Primary Care Malarie Tappen: Ananias Pilgrim Other Clinician: Referring Alf Doyle: Treating Jacey Pelc/Extender: [name], [name] in Treatment: 0 Activities of Daily Living Items Answer Activities of Daily Living (Please select one for each item) Drive Automobile Completely Able T Medications ake Completely Able Use T elephone Completely Able Care for Appearance Completely Able Use T oilet Completely Able Bath / Shower Completely Able Dress Self Completely Able Feed Self Completely Able Walk Completely Able Get In / Out Bed Completely Able Housework Completely Able Prepare Meals Completely Able Handle Money Completely Able Shop for Self Completely Able Electronic Signature(s) Signed: 11/22/2019 5:28:52 PM By: Zenaida Deed RN, BSN Entered By: Zenaida Deed on 11/22/2019 07:57:47 -------------------------------------------------------------------------------- Education Screening Details Patient Name: Date of Service: [facility], [name] 11/22/2019 7:30 A M Medical Record Number: [id number] Patient Account Number: [id number] Date of Birth/Sex: Treating RN: [birth year] (54 y.o. [name] Primary Care Amalee Olsen: Ananias Pilgrim Other Clinician: Referring Zackery Brine: Treating Shaquala Broeker/Extender: Alta Corning in Treatment: 0 Primary Learner Assessed: Patient Learning Preferences/Education Level/Primary Language Learning Preference: Explanation, Demonstration, Printed Material Highest Education Level: College or Above Preferred Language: English Cognitive Barrier Language Barrier: No Translator Needed: No Memory Deficit: No Emotional Barrier: No Cultural/Religious Beliefs Affecting Medical Care: No Physical Barrier Impaired Vision: Yes Contacts, left eye Impaired Hearing: No Decreased Hand dexterity: No Knowledge/Comprehension Knowledge Level: High Comprehension Level: High Ability to understand written instructions: High Ability to understand verbal instructions: High Motivation Anxiety Level: Calm Cooperation: Cooperative Education Importance: Acknowledges Need Interest in Health Problems: Asks Questions Perception: Coherent Willingness to Engage in Self-Management High Activities: Readiness to Engage in Self-Management High Activities: Electronic Signature(s) Signed: 11/22/2019 5:28:52 PM By: Zenaida Deed RN, BSN Entered By: Zenaida Deed on 11/22/2019 07:58:27 -------------------------------------------------------------------------------- Fall Risk Assessment Details Patient Name: Date of Service: [facility], [name] 11/22/2019 7:30 A M Medical Record Number: [id number] Patient Account Number: [id number] Date of Birth/Sex: Treating  RN: 14-May-1966 (54 y.o. [name] Primary Care Salihah Peckham: Ananias Pilgrim Other Clinician: Referring Darvis Croft: Treating Jozlyn Schatz/Extender: [name], Diona Fanti in Treatment: 0 Fall Risk Assessment Items Have you had 2 or more falls in the last 12 monthso 0 Yes Have you had any fall that resulted in injury in the last 12 monthso 0 No FALLS RISK SCREEN History of falling - immediate or within 3 months 25 Yes Secondary diagnosis (Do you have 2 or more medical diagnoseso) 0 No Ambulatory aid None/bed rest/wheelchair/nurse 0 Yes Crutches/cane/walker 0 No Furniture 0 No Intravenous therapy Access/Saline/Heparin Lock 0  No Gait/Transferring Normal/ bed rest/ wheelchair 0 Yes Weak (short steps with or without shuffle, stooped but able to lift head while walking, may seek 0 No support from furniture) Impaired (short steps with shuffle, may have difficulty arising from chair, head down, impaired 0 No balance) Mental Status Oriented to own ability 0 Yes Electronic Signature(s) Signed: 11/22/2019 5:28:52 PM By: Baruch Gouty RN, BSN Entered By: Baruch Gouty on 11/22/2019 08:00:30 -------------------------------------------------------------------------------- Foot Assessment Details Patient Name: Date of Service: [facility], [place]. 9/83/3825 7:30 A M Medical Record Number: [id number] Patient Account Number: [id number] Date of Birth/Sex: Treating RN: June 29, 1965 (54 y.o. Ernestene Mention Primary Care Jere Vanburen: [name] Other Clinician: Referring Camelle Henkels: Treating Sixto Bowdish/Extender: [name], [name] in Treatment: 0 Foot Assessment Items Site Locations + = Sensation present, - = Sensation absent, C = Callus, U = Ulcer R = Redness, W = Warmth, M = Maceration, PU = Pre-ulcerative lesion F = Fissure, S = Swelling, D = Dryness Assessment Right: Left: Other Deformity: No No Prior Foot Ulcer: No Yes Prior Amputation: No  No Charcot Joint: No No Ambulatory Status: Ambulatory Without Help Gait: Steady Electronic Signature(s) Signed: 11/22/2019 5:28:52 PM By: Baruch Gouty RN, BSN Entered By: Baruch Gouty on 11/22/2019 08:02:08 -------------------------------------------------------------------------------- Nutrition Risk Screening Details Patient Name: Date of Service: [facility], [place]. 1/93/7902 7:30 A M Medical Record Number: [id number] Patient Account Number: [id number] Date of Birth/Sex: Treating RN: 17-Oct-1965 (54 y.o. Ernestene Mention Primary Care Tu Bayle: [name] Other Clinician: Referring Cathie Bonnell: Treating Salima Rumer/Extender: [name], [name] in Treatment: 0 Height (in): 76 Weight (lbs): 255 Body Mass Index (BMI): 31 Nutrition Risk Screening Items Score Screening NUTRITION RISK SCREEN: I have an illness or condition that made me change the kind and/or amount of food I eat 0 No I eat fewer than two meals per day 0 No I eat few fruits and vegetables, or milk products 0 No I have three or more drinks of beer, liquor or wine almost every day 0 No I have tooth or mouth problems that make it hard for me to eat 0 No I don't always have enough money to buy the food I need 0 No I eat alone most of the time 1 Yes I take three or more different prescribed or over-the-counter drugs a day 1 Yes Without wanting to, I have lost or gained 10 pounds in the last six months 0 No I am not always physically able to shop, cook and/or feed myself 0 No Nutrition Protocols Good Risk Protocol 0 No interventions needed Moderate Risk Protocol High Risk Proctocol Risk Level: Good Risk Score: 2 Electronic Signature(s) Signed: 11/22/2019 5:28:52 PM By: Baruch Gouty RN, BSN Entered By: Baruch Gouty on 11/22/2019 08:00:47

## 2019-11-23 ENCOUNTER — Other Ambulatory Visit (HOSPITAL_COMMUNITY): Payer: Self-pay | Admitting: Internal Medicine

## 2019-11-23 ENCOUNTER — Other Ambulatory Visit: Payer: Self-pay | Admitting: Internal Medicine

## 2019-11-23 DIAGNOSIS — E10621 Type 1 diabetes mellitus with foot ulcer: Secondary | ICD-10-CM

## 2019-11-23 NOTE — Progress Notes (Addendum)
NICOLAAS, SAVO ([id number]) Visit Report for 11/22/2019 Allergy List Details Patient Name: Date of Service: [facility], [place] 11/22/2019 7:30 A M Medical Record Number: [id number] Patient Account Number: [id number] Date of Birth/Sex: Treating RN: [birth year] (54 y.o. [name] Primary Care Deagan Sevin: Ananias Pilgrim Other Clinician: Referring Joclynn Lumb: Treating Lorae Roig/Extender: [name], [name] in Treatment: 0 Allergies Active Allergies lithium Reaction: intolerance- uncomfortable feeling Severity: Moderate Adderall Reaction: palpitations Ritalin Reaction: palpitations Allergy Notes Electronic Signature(s) Signed: 11/22/2019 5:28:52 PM By: Zenaida Deed RN, BSN Entered By: Zenaida Deed on 11/22/2019 07:50:09 -------------------------------------------------------------------------------- Arrival Information Details Patient Name: Date of Service: [facility], [name] 11/22/2019 7:30 A M Medical Record Number: [id number] Patient Account Number: [id number] Date of Birth/Sex: Treating RN: 07/08/1965 (54 y.o. [name] Primary Care Chandan Fly: Ananias Pilgrim Other Clinician: Referring Eldin Bonsell: Treating Jakaila Norment/Extender: Alta Corning in Treatment: 0 Visit Information Patient Arrived: Ambulatory Arrival Time: 07:48 Accompanied By: self Transfer Assistance: None Patient Identification Verified: Yes Secondary Verification Process Completed: Yes Patient Requires Transmission-Based Precautions: No Patient Has Alerts: No History Since Last Visit Has Dressing in Place as Prescribed: Yes Electronic Signature(s) Signed: 11/22/2019 5:28:52 PM By: Zenaida Deed RN, BSN Entered By: Zenaida Deed on 11/22/2019 07:49:06 -------------------------------------------------------------------------------- Clinic Level of Care Assessment Details Patient Name: Date of Service: [facility], [place]. 11/22/2019 7:30 A  M Medical Record Number: [id number] Patient Account Number: [id number] Date of Birth/Sex: Treating RN: 1966-01-13 (53 y.o. Judie Petit) [name] Primary Care Shalita Notte: Ananias Pilgrim Other Clinician: Referring Desma Wilkowski: Treating Lavoris Canizales/Extender: [name], [name] in Treatment: 0 Clinic Level of Care Assessment Items TOOL 2 Quantity Score X- 1 0 Use when only an EandM is performed on the INITIAL visit ASSESSMENTS - Nursing Assessment / Reassessment X- 1 20 General Physical Exam (combine w/ comprehensive assessment (listed just below) when performed on new pt. evals) X- 1 25 Comprehensive Assessment (HX, ROS, Risk Assessments, Wounds Hx, etc.) ASSESSMENTS - Wound and Skin A ssessment / Reassessment X - Simple Wound Assessment / Reassessment - one wound 1 5 []  - 0 Complex Wound Assessment / Reassessment - multiple wounds []  - 0 Dermatologic / Skin Assessment (not related to wound area) ASSESSMENTS - Ostomy and/or Continence Assessment and Care []  - 0 Incontinence Assessment and Management []  - 0 Ostomy Care Assessment and Management (repouching, etc.) PROCESS - Coordination of Care X - Simple Patient / Family Education for ongoing care 1 15 []  - 0 Complex (extensive) Patient / Family Education for ongoing care X- 1 10 Staff obtains , Records, T Results / Process Orders est []  - 0 Staff telephones HHA, Nursing Homes / Clarify orders / etc []  - 0 Routine Transfer to another Facility (non-emergent condition) []  - 0 Routine Hospital Admission (non-emergent condition) X- 1 15 New Admissions / / Ordering NPWT Apligraf, etc. , []  - 0 Emergency Hospital Admission (emergent condition) X- 1 10 Simple Discharge Coordination []  - 0 Complex (extensive) Discharge Coordination PROCESS - Special Needs []  - 0 Pediatric / Minor Patient Management []  - 0 Isolation Patient Management []  - 0 Hearing / Language / Visual special needs []   - 0 Assessment of Community assistance (transportation, D/C planning, etc.) []  - 0 Additional assistance / Altered mentation []  - 0 Support Surface(s) Assessment (bed, cushion, seat, etc.) INTERVENTIONS - Wound Cleansing / Measurement X- 1 5 Wound Imaging (photographs - any number of wounds) []  - 0 Wound Tracing (instead of photographs) X- 1 5  Simple Wound Measurement - one wound []  - 0 Complex Wound Measurement - multiple wounds X- 1 5 Simple Wound Cleansing - one wound []  - 0 Complex Wound Cleansing - multiple wounds INTERVENTIONS - Wound Dressings []  - 0 Small Wound Dressing one or multiple wounds X- 1 15 Medium Wound Dressing one or multiple wounds []  - 0 Large Wound Dressing one or multiple wounds []  - 0 Application of Medications - injection INTERVENTIONS - Miscellaneous []  - 0 External ear exam []  - 0 Specimen Collection (cultures, biopsies, blood, body fluids, etc.) []  - 0 Specimen(s) / Culture(s) sent or taken to Lab for analysis []  - 0 Patient Transfer (multiple staff / Civil Service fast streamer / Similar devices) []  - 0 Simple Staple / Suture removal (25 or less) []  - 0 Complex Staple / Suture removal (26 or more) []  - 0 Hypo / Hyperglycemic Management (close monitor of Blood Glucose) X- 1 15 Ankle / Brachial Index (ABI) - do not check if billed separately Has the patient been seen at the hospital within the last three years: Yes Total Score: 145 Level Of Care: New/Established - Level 4 Electronic Signature(s) Signed: 11/23/2019 4:51:47 PM By: Carlene Coria RN Entered By: Carlene Coria on 11/22/2019 08:41:46 -------------------------------------------------------------------------------- Encounter Discharge Information Details Patient Name: Date of Service: [facility], [name] 5/73/2202 7:30 A M Medical Record Number: [id number] Patient Account Number: [id number] Date of Birth/Sex: Treating RN: [birth year] (54 y.o. Marvis Repress Primary Care Kimisha Eunice: [name] Other Clinician: Referring Crestina Strike: Treating Sargun Rummell/Extender: Elwyn Lade in Treatment: 0 Encounter Discharge Information Items Discharge Condition: Stable Ambulatory Status: Ambulatory Discharge Destination: Home Transportation: Private Auto Accompanied By: self Schedule Follow-up Appointment: Yes Clinical Summary of Care: Patient Declined Electronic Signature(s) Signed: 11/22/2019 5:14:59 PM By: Kela Millin Entered By: Kela Millin on 11/22/2019 09:19:59 -------------------------------------------------------------------------------- Lower Extremity Assessment Details Patient Name: Date of Service: [facility], [place] 11/27/3149 7:30 A M Medical Record Number: [id number] Patient Account Number: [id number] Date of Birth/Sex: Treating RN: 05-22-1966 (54 y.o. Ernestene Mention Primary Care Teja Judice: [name] Other Clinician: Referring Cheryl Stabenow: Treating Daveena Elmore/Extender: [name], [name] in Treatment: 0 Edema Assessment Assessed: Shirlyn Goltz: No] [Right: No] Edema: [Left: N] [Right: o] Calf Left: Right: Point of Measurement: cm From Medial Instep 35.5 cm cm Ankle Left: Right: Point of Measurement: cm From Medial Instep 22.3 cm cm Vascular Assessment Pulses: Dorsalis Pedis Palpable: [Left:Yes] Electronic Signature(s) Signed: 11/22/2019 5:28:52 PM By: Baruch Gouty RN, BSN Entered By: Baruch Gouty on 11/22/2019 08:02:48 -------------------------------------------------------------------------------- Multi Wound Chart Details Patient Name: Date of Service: [facility], [place] 0/62/6948 7:30 A M Medical Record Number: [id number] Patient Account Number: [id number] Date of Birth/Sex: Treating RN: 14-Sep-1965 (53 y.o. Jerilynn Mages) Carlene Coria Primary Care Hana Trippett: [name] Other Clinician: Referring Abir Eroh: Treating Ines Warf/Extender: [name], [name] in Treatment: 0 Vital  Signs Height(in): 76 Capillary Blood Glucose(mg/dl): 180 Weight(lbs): 255 Pulse(bpm): 79 Body Mass Index(BMI): 31 Blood Pressure(mmHg): 137/76 Temperature(F): 99.3 Respiratory Rate(breaths/min): 18 Photos: [3:No Photos Left Metatarsal head fifth] [N/A:N/A N/A] Wound Location: [3:Gradually Appeared] [N/A:N/A] Wounding Event: [3:Diabetic Wound/Ulcer of the Lower] [N/A:N/A] Primary Etiology: [3:Extremity Cataracts, Type I Diabetes,] [N/A:N/A] Comorbid History: [3:Osteomyelitis, Neuropathy 08/01/2019] [N/A:N/A] Date Acquired: [3:0] [N/A:N/A] Weeks of Treatment: [3:Open] [N/A:N/A] Wound Status: [3:1.9x1.8x0.4] [N/A:N/A] Measurements L x W x D (cm) [3:2.686] [N/A:N/A] A (cm) : rea [3:1.074] [N/A:N/A] Volume (cm) : [3:1] Starting Position 1 (o'clock): [3:7] Ending Position 1 (o'clock): [3:0.7] Maximum Distance 1 (cm): [3:Yes] [N/A:N/A]  Undermining: [3:Grade 1] [N/A:N/A] Classification: [3:Medium] [N/A:N/A] Exudate A mount: [3:Serosanguineous] [N/A:N/A] Exudate Type: [3:red, brown] [N/A:N/A] Exudate Color: [3:Thickened] [N/A:N/A] Wound Margin: [3:Large (67-100%)] [N/A:N/A] Granulation Amount: [3:Red] [N/A:N/A] Granulation Quality: [3:Small (1-33%)] [N/A:N/A] Necrotic Amount: [3:Fat Layer (Subcutaneous Tissue)] [N/A:N/A] Exposed Structures: [3:Exposed: Yes Fascia: No Tendon: No Muscle: No Joint: No Bone: No None] [N/A:N/A] Treatment Notes Electronic Signature(s) Signed: 11/22/2019 5:20:04 PM By: Baltazar Najjar MD Signed: 11/23/2019 4:51:47 PM By: [name] RN Entered By: Baltazar Najjar on 11/22/2019 08:38:13 -------------------------------------------------------------------------------- Multi-Disciplinary Care Plan Details Patient Name: Date of Service: [facility], [name] 11/22/2019 7:30 A M Medical Record Number: [id number] Patient Account Number: [id number] Date of Birth/Sex: Treating RN: 12/28/1965 (53 y.o. Melonie [place] Primary Care Nimrat Woolworth: Ananias Pilgrim Other  Clinician: Referring Jaziya Obarr: Treating Shanyah Gattuso/Extender: [name], [name] in Treatment: 0 Active Inactive Wound/Skin Impairment Nursing Diagnoses: Knowledge deficit related to ulceration/compromised skin integrity Goals: Patient/caregiver will verbalize understanding of skin care regimen Date Initiated: 11/22/2019 Target Resolution Date: 12/22/2019 Goal Status: Active Ulcer/skin breakdown will have a volume reduction of 30% by week 4 Date Initiated: 11/22/2019 Target Resolution Date: 12/22/2019 Goal Status: Active Interventions: Assess patient/caregiver ability to obtain necessary supplies Assess patient/caregiver ability to perform ulcer/skin care regimen upon admission and as needed Assess ulceration(s) every visit Notes: Electronic Signature(s) Signed: 11/23/2019 4:51:47 PM By: [name] RN Entered By: [name] on 11/22/2019 08:18:58 -------------------------------------------------------------------------------- Pain Assessment Details Patient Name: Date of Service: [facility], [name] 11/22/2019 7:30 A M Medical Record Number: [id number] Patient Account Number: [id number] Date of Birth/Sex: Treating RN: 1966-04-23 (54 y.o. [name] Primary Care Dericka Ostenson: Ananias Pilgrim Other Clinician: Referring Maiyah Goyne: Treating Ayomikun Starling/Extender: [name], [name] in Treatment: 0 Active Problems Location of Pain Severity and Description of Pain Patient Has Paino Yes Site Locations Pain Location: Generalized Pain With Dressing Change: No Duration of the Pain. Constant / Intermittento Intermittent Rate the pain. Current Pain Level: 0 Worst Pain Level: 4 Least Pain Level: 0 Character of Pain Describe the Pain: Other: sore when ambulating Pain Management and Medication Current Pain Management: Is the Current Pain Management Adequate: Adequate How does your wound impact your activities of daily livingo Sleep: No Bathing:  No Appetite: No Relationship With Others: No Bladder Continence: No Emotions: No Bowel Continence: No Work: No Toileting: No Drive: No Dressing: No Hobbies: No Notes pt reports discomfort in arch with ambulation Electronic Signature(s) Signed: 11/22/2019 5:28:52 PM By: Zenaida Deed RN, BSN Entered By: Zenaida Deed on 11/22/2019 08:07:30 -------------------------------------------------------------------------------- Patient/Caregiver Education Details Patient Name: Date of Service: [facility], [place] 6/22/2021andnbsp7:30 A M Medical Record Number: [id number] Patient Account Number: [id number] Date of Birth/Gender: Treating RN: 1966/03/02 (53 y.o. Judie Petit) [name] Primary Care Physician: Ananias Pilgrim Other Clinician: Referring Physician: Treating Physician/Extender: Alta Corning in Treatment: 0 Education Assessment Education Provided To: Patient Education Topics Provided Wound/Skin Impairment: Methods: Explain/Verbal Responses: State content correctly Electronic Signature(s) Signed: 11/23/2019 4:51:47 PM By: [name] RN Entered By: [name] on 11/22/2019 08:19:09 -------------------------------------------------------------------------------- Wound Assessment Details Patient Name: Date of Service: [facility], [name] 11/22/2019 7:30 A M Medical Record Number: [id number] Patient Account Number: [id number] Date of Birth/Sex: Treating RN: [birth year] (54 y.o. [name] Primary Care Giulliana Mcroberts: Ananias Pilgrim Other Clinician: Referring Shirley Bolle: Treating Ladye Macnaughton/Extender: [name], [name] in Treatment: 0 Wound Status Wound Number: 3 Primary Etiology: Diabetic Wound/Ulcer of the Lower Extremity Wound Location: Left Metatarsal head fifth Wound Status: Open Wounding  Event: Gradually Appeared Comorbid History: Cataracts, Type I Diabetes, Osteomyelitis, Neuropathy Date Acquired: 08/01/2019 Weeks Of  Treatment: 0 Clustered Wound: No Photos Wound Measurements Length: (cm) 1.9 Width: (cm) 1.8 Depth: (cm) 0.4 Area: (cm) 2.686 Volume: (cm) 1.074 % Reduction in Area: 0% % Reduction in Volume: 0% Epithelialization: None Tunneling: No Undermining: Yes Starting Position (o'clock): 1 Ending Position (o'clock): 7 Maximum Distance: (cm) 0.7 Wound Description Classification: Grade 1 Wound Margin: Thickened Exudate Amount: Medium Exudate Type: Serosanguineous Exudate Color: red, brown Foul Odor After Cleansing: No Slough/Fibrino No Wound Bed Granulation Amount: Large (67-100%) Exposed Structure Granulation Quality: Red Fascia Exposed: No Necrotic Amount: Small (1-33%) Fat Layer (Subcutaneous Tissue) Exposed: Yes Necrotic Quality: Adherent Slough Tendon Exposed: No Muscle Exposed: No Joint Exposed: No Bone Exposed: No Treatment Notes Wound #3 (Left Metatarsal head fifth) 1. Cleanse With Wound Cleanser 2. Periwound Care Skin Prep 3. Primary Dressing Applied Calcium Alginate Ag 4. Secondary Dressing Dry Gauze Roll Gauze 5. Secured With Secretary/administrator) Signed: 11/24/2019 5:20:52 PM By: Zenaida Deed RN, BSN Signed: 11/25/2019 5:12:49 PM By: Marlinda Mike Previous Signature: 11/22/2019 5:28:52 PM Version By: Zenaida Deed RN, BSN Entered By: Marlinda Mike on 11/24/2019 13:38:08 -------------------------------------------------------------------------------- Vitals Details Patient Name: Date of Service: [facility], [name] 11/22/2019 7:30 A M Medical Record Number: [id number] Patient Account Number: [id number] Date of Birth/Sex: Treating RN: 06/15/1965 (54 y.o. [name] Primary Care Sharmeka Palmisano: Ananias Pilgrim Other Clinician: Referring Naryiah Schley: Treating Banks Chaikin/Extender: [name], [name] in Treatment: 0 Vital Signs Time Taken: 07:49 Temperature (F): 99.3 Height (in): 76 Pulse (bpm): 79 Source:  Stated Respiratory Rate (breaths/min): 18 Weight (lbs): 255 Blood Pressure (mmHg): 137/76 Source: Stated Capillary Blood Glucose (mg/dl): 468 Body Mass Index (BMI): 31 Reference Range: 80 - 120 mg / dl Notes glucose per pt report yesterday Electronic Signature(s) Signed: 11/22/2019 5:28:52 PM By: Zenaida Deed RN, BSN Entered By: Zenaida Deed on 11/22/2019 07:50:00

## 2019-11-23 NOTE — Progress Notes (Signed)
ABU, HEAVIN ([id number]) Visit Report for 11/22/2019 Chief Complaint Document Details Patient Name: Date of Service: [facility], [place] 0/30/0923 7:30 A M Medical Record Number: [id number] Patient Account Number: [id number] Date of Birth/Sex: Treating RN: 07/05/1965 (54 y.o. [name]) Carlene Coria Primary Care Provider: [name] Other Clinician: Referring Provider: Treating Provider/Extender: [name], [name] in Treatment: 0 Information Obtained from: Patient Chief Complaint 05/04/2018; patient is here for review of a wound over the left plantar fourth metatarsal head referred by [name] of podiatric surgery. 11/22/2019; patient is here for review of the wound on the plantar left fifth metatarsal head. She was referred by [name] Electronic Signature(s) Signed: 11/22/2019 5:20:04 PM By: [name] MD Entered By: [name] on 11/22/2019 08:38:45 -------------------------------------------------------------------------------- HPI Details Patient Name: Date of Service: [facility] [place] 3/35/4562 7:30 A M Medical Record Number: [id number] Patient Account Number: [id number] Date of Birth/Sex: Treating RN: [birth year] (54 y.o. Oval Linsey Primary Care Provider: [name] Other Clinician: Referring Provider: Treating Provider/Extender: [name], [name] in Treatment: 0 History of Present Illness HPI Description: ADMISSION 05/04/2018 This is a 54 year old man who apparently is a type I diabetic confirmed by appropriate serology testing 6 years ago. He is therefore on insulin. His most recent hemoglobin A1c was 10.7 on 03/27/2018. 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 As a result of a combination of this he is insensate and even has reduced sensation in his  upper extremities. His current problem started in early July he noted a blister over the left fourth plantar metatarsal head. He saw [name] his podiatrist on 01/07/2018. At that time he had a wound that measured 2 x 1 x 0.4 cm. An x-ray did not show osteomyelitis. It did not appear that he actually followed up. He was admitted to hospital on 03/25/2018 through 03/30/2018 with his left foot painful and swollen. He underwent a surgical IandD by [name]. I believe his culture at that time showed Enterococcus faecalis and a bone culture showed enterococcus avium. He is been reviewed by [name] of infectious disease although I have not reviewed his note and he has been on 6 weeks worth of ertapenem which should finish sometime apparently on December 9.. MRI suggested possibility of septic arthritis but did not comment specifically on osteomyelitis. A more recent wound culture was negative. The patient has been referred here for consideration among other things of hyperbaric oxygen versus proceeding with a partial ray amputation. This would involve excision of the fourth metatarsal head. The patient is offloading this and a Darco forefoot off loader. I think he is using iodoform packing. 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 Certainly would be a possibility of a total contact cast. I did not see any arterial studies on him however his peripheral pulses are palpable 05/11/2018; patient started his hyperbarics on Friday but did not dive yesterday for personal issues. He saw [name] on 12//19 he notes that during the hospitalization in October 2019 he had an abscess and underwent a bone biopsy with culture that showed group B strep, enterococcus avium and Enterococcus faecalis. There was Prevotella from the abscess. He was put on ertapenem for 6 weeks which he completed. Per [name] notable for the fact that he is not been treated with ampicillin or amoxicillin. His  hemoglobin A1c was noted to be high at 10.7. He is started on ampicillin sulbactam every 6 again I think this is for better coverage for the enterococcus. We started him on silver alginate as there is still purulent looking drainage coming out of the wound and there is indeed exposed bone. The patient has not been unwell 05/18/2018; patient continues with hyperbarics he is tolerating this well and has no complaints. He is on Unasyn every 6. We have been using silver alginate to the area over the fourth plantar metatarsal head. He is a poorly controlled diabetic. There is certainly less purulent drainage than when I first saw this man at the beginning of the month. He only sedimentation rate I see on him was 40 on 03/26/2018. States his blood sugars are under better control in the 120s to 130 range. Follows with his endocrinologist in [facility] I change his primary dressing to Prisma today attempting to increase the granulation. After I left the room he called [place] back into look at a area that he noticed on the right foot 2 days ago. Very superficial and in the medial aspect of his high plantar arch [secondary to his neuropathy] he has a custom insert in the shoe on the right foot. 05/24/2018; continues with HBO doing well. He saw infectious disease today and is still on Unasyn every 6 hours. Not making a lot of progress on the left foot with Prisma I changed him to silver alginate strips. The new area on the right midfoot he is putting silver alginate on this as well. 06/01/2018; patient remains on Unasyn. Not making a lot of progress with granulation. I am going to put him in a total contact cast use Prisma for now. He has a high co-pay for an advanced treatment option but I suspect feeling in this wound may come to that 06/04/2018; patient back for the obligatory first total contact cast change. We continue to use Prisma 06/08/2018; the patient is tolerating the cast well although he had some loosening  over the tibia which rubbed when he walked. There is no lesion here. We have been using Prisma under the total contact cast. He is in hyperbarics which she is tolerating well 06/15/2018; tolerating total contact cast and hyperbarics well. He continues IV antibiotics and for the underlying osteomyelitis as directed by infectious disease. His wound is making really nice progress 1/21; Patient's wound is down in depth. Using silver collagen. Ab's end this week and Pic line is to be d/ced 1/28; 0.8 cm in depth which is more than last time. We have been using silver collagen under total contact cast. He continues in hyperbarics. His IV antibiotics are finished PICC line has been removed 2/4; surprisingly today the area is actually epithelialized. There is no open area. There is however a divot where the epithelium is gone down to close the wound surface. I am not completely convinced that this is going to remain viable over time but I think time is the best thing to determine this. There is no evidence of surrounding infection. As of today I do not think a repeat MRI or any other procedure is going to be indicative of whether this is going to maintain a closed state. I am going to continue him in a total contact cast. He will complete HBO. Next week I will allow him perhaps to graduate into a Darco forefoot off loader, then a healing sandal. I will then send him back to podiatry to see  if they can fashion an offloading shoe if this area remains closed 2/10; the patient was taken out of the cast today the area is healed. He has a divot however this is fully epithelialized and the epithelialized tissue looks healthy. I have put him into a Darco forefoot off loader. He will complete his last 2 hyperbaric treatments. I will see him next week and I may transition him into a surgical shoe versus his own shoe. He is following up with [name] of podiatry and I think a diabetic shoe with custom inserts could be  provided with the signature provided by his endocrinologist ADMISSION 6/22 CLINICAL DATA: Foot ulcer, question of osteomyelitis EXAM: MRI OF THE LEFT FOOT WITHOUT CONTRAST TECHNIQUE: Multiplanar, multisequence MR imaging of the left was performed. No intravenous contrast was administered. COMPARISON: Radiograph same day FINDINGS: Bones/Joint/Cartilage Small cystic changes are seen at the fifth metatarsal head, likely degenerative changes. No marrow edema, osseous fracture, or periosteal reaction are seen throughout the osseous structures. Small joint effusion seen at the fifth MTP joint. There appears to be chronic cortical irregularity with erosive type change seen at the fourth metatarsal head which is unchanged since June 28, 2019, however new since 2019. Ligaments The Lisfranc ligaments and collateral ligaments appear to be intact. Muscles and Tendons Diffusely increased signal seen throughout the muscles surrounding the forefoot with fatty atrophy. The flexor and extensor tendons appear to be intact. The plantar fascia is intact. Soft tissues Along the dorsal lateral aspect of the fifth metatarsal shaft there is a focal area superficial ulceration measuring approximately 8 mm in transverse dimension. There is overlying skin thickening and subcutaneous edema seen. No sinus tract or loculated fluid collection is noted. IMPRESSION: 1. Small focal area of ulceration along the dorsal lateral aspect of the fifth metatarsal shaft. No definite evidence of acute osteomyelitis, sinus tract, or abscess. 2. Progressive chronic erosive cortical destruction seen at the fourth metatarsal head since 2019, however unchanged since June 28, 2019, which could be from chronic osteomyelitis or prior injury. 3. Small joint effusion at the fifth MTP joint. Electronically Signed By: Prudencio Pair M.D. On: 08/02/2019 56:80 54 year old type I diabetic who we had in clinic in late 2019 to  February 2020 with osteomyelitis of his left fourth metatarsal head and a wound in this area. This eventually healed with IV antibiotics and hyperbaric oxygen. He was relatively well until February of this year when he developed I believe 2 wounds on the left fifth met head 1 plantar and 1 more laterally. The one more laterally has since closed over but he has been left with a fairly persistent wound in the plantar left fifth met head. Has been following with [name] of podiatry. For an extended period of time he used Medihoney. He received doxycycline and April at which time a culture showed E. coli and MSSA. An MRI done on August 02, 2019 [shown above] did not show osteomyelitis but did suggest some ongoing destruction at the fourth metatarsal head. He was switched to silver alginate as the primary dressing in May. Earlier this month he received a 2-week course of Bactrim due to erythema around the wound. He was referred to Dr. Alfred Levins of infectious disease who did not think anything was infected and recommended stopping antibiotics. The patient said he felt really well has you finished up the Bactrim but then yesterday morning he became febrile again and noted more erythema around the wound. He was referred here for our review of  this. His temperature today is 99.3. He denies any other infectious symptoms no cough no upper respiratory, no dysuria etc. He has been using silver alginate and offloading this area with a surgical shoe. 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 He apparently had shoulder surgery in the fall 2020 I did not look this up he apparently has hardware in this area. Recent x-ray done in Dr. Pasty Arch office on 6/7 was negative for osteo- ABIs have always been noncompressible. Is not previously been thought that arterial insufficiency is  contributing to this. Electronic Signature(s) Signed: 11/22/2019 5:20:04 PM By: [name] MD Entered By: [name] on 11/22/2019 08:44:06 -------------------------------------------------------------------------------- Physical Exam Details Patient Name: Date of Service: [facility], [place] 0/01/3817 7:30 A M Medical Record Number: [id number] Patient Account Number: [id number] Date of Birth/Sex: Treating RN: June 13, 1965 (53 y.o. Oval Linsey Primary Care Provider: [name] Other Clinician: Referring Provider: Treating Provider/Extender: [name], [name] in Treatment: 0 Constitutional Patient is hypertensive.. Pulse regular and within target range for patient.Marland Kitchen Respirations regular, non-labored and within target range.. Temperature is normal and within the target range for the patient.Marland Kitchen Appears in no distress. Eyes Conjunctivae clear. No discharge.no icterus. Respiratory work of breathing is normal. Bilateral breath sounds are clear and equal in all lobes with no wheezes, rales or rhonchi.. Cardiovascular Heart rhythm and rate regular, without murmur or gallop.. Popliteal and femoral pulses palpable. Fetal pulses are normal in the left foot. Integumentary (Hair, Skin) There is mild erythema and loss of surface epithelium around the wound area. Somewhat tender even through his neuropathy. Psychiatric appears at normal baseline. Notes Wound exam; left fifth metatarsal head. Both the size of a quarter. Most of this has healthy granulation center part of this has a small deeper area but this does not go straight to bone but certainly approaches it. There is no purulent drainage. Erythema around the wound as noted above Electronic Signature(s) Signed: 11/22/2019 5:20:04 PM By: [name] MD Entered By: [name] on 11/22/2019 08:45:40 -------------------------------------------------------------------------------- Physician Orders  Details Patient Name: Date of Service: [facility] [place] 7/89/3810 7:30 A M Medical Record Number: [id number] Patient Account Number: [id number] Date of Birth/Sex: Treating RN: 17-Sep-1965 (53 y.o. [name]) Carlene Coria Primary Care Provider: [name] Other Clinician: Referring Provider: Treating Provider/Extender: [name], [name] in Treatment: 0 Verbal / Phone Orders: No Diagnosis Coding ICD-10 Coding Code Description E10.621 Type 1 diabetes mellitus with foot ulcer L97.528 Non-pressure chronic ulcer of other part of left foot with other specified severity E10.42 Type 1 diabetes mellitus with diabetic polyneuropathy G62.89 Other specified polyneuropathies Follow-up Appointments Return Appointment in 1 week. Dressing Change Frequency Change dressing every day. Wound Cleansing May shower and wash wound with soap and water. Primary Wound Dressing Wound #3 Left Metatarsal head fifth Calcium Alginate with Silver Secondary Dressing Dry Gauze - secure with tape Radiology MRI, foot , left. with and with out contast - non healing wound lef tfoot Electronic Signature(s) Signed: 11/22/2019 5:20:04 PM By: [name] MD Signed: 11/23/2019 4:51:47 PM By: Carlene Coria RN Entered By: Carlene Coria on 11/22/2019 08:40:48 Prescription 11/22/2019 -------------------------------------------------------------------------------- Theone Stanley B. [name] MD Patient Name: Provider: Jun 29, 1965 [id number] Date of Birth: NPI#: [name] [id number] Sex: DEA #: [id number] Phone #: License #: [place] Patient Address: [address]  [place], [place] [address] [place], [place] [address] [phone] Allergies Name Reaction Severity lithium intolerance- uncomfortable feeling Moderate Adderall palpitations Ritalin palpitations Provider's Orders MRI, foot , left. with and with out  contast - non healing wound lef tfoot Hand Signature: Date(s): Electronic Signature(s) Signed: 11/22/2019 5:20:04 PM By: [name] MD Signed: 11/23/2019 4:51:47 PM By: Carlene Coria RN Entered By: Carlene Coria on 11/22/2019 08:40:48 -------------------------------------------------------------------------------- Problem List Details Patient Name: Date of Service: [facility], [place] 3/81/7711 7:30 A M Medical Record Number: [id number] Patient Account Number: [id number] Date of Birth/Sex: Treating RN: [birth year] (53 y.o. Oval Linsey Primary Care Provider: [name] Other Clinician: Referring Provider: Treating Provider/Extender: [name], [name] in Treatment: 0 Active Problems ICD-10 Encounter Code Description Active Date MDM Diagnosis E10.621 Type 1 diabetes mellitus with foot ulcer 11/22/2019 No Yes L97.528 Non-pressure chronic ulcer of other part of left foot with other specified 11/22/2019 No Yes severity E10.42 Type 1 diabetes mellitus with diabetic polyneuropathy 11/22/2019 No Yes G62.89 Other specified polyneuropathies 11/22/2019 No Yes Inactive Problems Resolved Problems Electronic Signature(s) Signed: 11/22/2019 5:20:04 PM By: [name] MD Entered By: [name] on 11/22/2019 07:55:02 -------------------------------------------------------------------------------- Progress Note Details Patient Name: Date of Service: [facility], [place] 3/83/2919 7:30 A M Medical Record Number: [id number] Patient Account Number: [id number] Date of Birth/Sex: Treating RN: 1966-05-16 (53 y.o. Oval Linsey Primary Care Provider: [name] Other Clinician: Referring Provider: Treating Provider/Extender: [name], [name] in Treatment: 0 Subjective Chief Complaint Information obtained from Patient 05/04/2018; patient is here for review of a wound over the left plantar fourth metatarsal head referred by Dr.  Jacqualyn Posey of podiatric surgery. 11/22/2019; patient is here for review of the wound on the plantar left fifth metatarsal head. She was referred by [name] History of Present Illness (HPI) ADMISSION 05/04/2018 This is a 54 year old man who apparently is a type I diabetic confirmed by appropriate serology testing 6 years ago. He is therefore on insulin. His most recent hemoglobin A1c was 10.7 on 03/27/2018. 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 As a result of a combination of this he is insensate and even has reduced sensation in his upper extremities. His current problem started in early July he noted a blister over the left fourth plantar metatarsal head. He saw [name] his podiatrist on 01/07/2018. At that time he had a wound that measured 2 x 1 x 0.4 cm. An x-ray did not show osteomyelitis. It did not appear that he actually followed up. He was admitted to hospital on 03/25/2018 through 03/30/2018 with his left foot painful and swollen. He underwent a surgical IandD by [name]. I believe his culture at that time showed Enterococcus faecalis and a bone culture showed enterococcus avium. He is been reviewed by [name] of infectious disease although I have not reviewed his note and he has been on 6 weeks worth of ertapenem which should finish sometime apparently on December 9.. MRI suggested possibility of septic arthritis but did not comment specifically on osteomyelitis. A more recent wound culture was negative. The patient has been referred here for consideration among other things of hyperbaric oxygen versus proceeding with a partial ray amputation. This would involve excision of the fourth metatarsal head. The patient is offloading this and a Darco forefoot off loader. I think he  is using iodoform packing. 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 Certainly would be a possibility of a total contact cast. I did not see any arterial studies on him however his peripheral pulses are palpable 05/11/2018; patient started his hyperbarics on Friday but did not dive yesterday for personal issues. He saw [name] on 12//19 he notes that during the hospitalization in October 2019 he had an abscess and underwent a bone biopsy with culture that showed group B strep, enterococcus avium and Enterococcus faecalis. There was Prevotella from the abscess. He was put on ertapenem for 6 weeks which he completed. Per [name] notable for the fact that he is not been treated with ampicillin or amoxicillin. His hemoglobin A1c was noted to be high at 10.7. He is started on ampicillin sulbactam every 6 again I think this is for better coverage for the enterococcus. We started him on silver alginate as there is still purulent looking drainage coming out of the wound and there is indeed exposed bone. The patient has not been unwell 05/18/2018; patient continues with hyperbarics he is tolerating this well and has no complaints. He is on Unasyn every 6. We have been using silver alginate to the area over the fourth plantar metatarsal head. He is a poorly controlled diabetic. There is certainly less purulent drainage than when I first saw this man at the beginning of the month. He only sedimentation rate I see on him was 40 on 03/26/2018. States his blood sugars are under better control in the 120s to 130 range. Follows with his endocrinologist in [facility] I change his primary dressing to Prisma today attempting to increase the granulation. After I left the room he called [place] back into look at a area that he noticed on the right foot 2 days ago. Very superficial and in the medial aspect of his high plantar arch [secondary to his neuropathy] he has a custom insert in the shoe on the right foot. 05/24/2018; continues with  HBO doing well. He saw infectious disease today and is still on Unasyn every 6 hours. Not making a lot of progress on the left foot with Prisma I changed him to silver alginate strips. The new area on the right midfoot he is putting silver alginate on this as well. 06/01/2018; patient remains on Unasyn. Not making a lot of progress with granulation. I am going to put him in a total contact cast use Prisma for now. He has a high co-pay for an advanced treatment option but I suspect feeling in this wound may come to that 06/04/2018; patient back for the obligatory first total contact cast change. We continue to use Prisma 06/08/2018; the patient is tolerating the cast well although he had some loosening over the tibia which rubbed when he walked. There is no lesion here. We have been using Prisma under the total contact cast. He is in hyperbarics which she is tolerating well 06/15/2018; tolerating total contact cast and hyperbarics well. He continues IV antibiotics and for the underlying osteomyelitis as directed by infectious disease. His wound is making really nice progress 1/21; Patient's wound is down in depth. Using silver collagen. Ab's end this week and Pic line is to be d/ced 1/28; 0.8 cm in depth which is more than last time. We have been using silver collagen under total contact cast. He continues in hyperbarics. His IV antibiotics are finished PICC line has been removed 2/4; surprisingly today the area is actually  epithelialized. There is no open area. There is however a divot where the epithelium is gone down to close the wound surface. I am not completely convinced that this is going to remain viable over time but I think time is the best thing to determine this. There is no evidence of surrounding infection. As of today I do not think a repeat MRI or any other procedure is going to be indicative of whether this is going to maintain a closed state. I am going to continue him in a total contact  cast. He will complete HBO. Next week I will allow him perhaps to graduate into a Darco forefoot off loader, then a healing sandal. I will then send him back to podiatry to see if they can fashion an offloading shoe if this area remains closed 2/10; the patient was taken out of the cast today the area is healed. He has a divot however this is fully epithelialized and the epithelialized tissue looks healthy. I have put him into a Darco forefoot off loader. He will complete his last 2 hyperbaric treatments. I will see him next week and I may transition him into a surgical shoe versus his own shoe. He is following up with [name] of podiatry and I think a diabetic shoe with custom inserts could be provided with the signature provided by his endocrinologist ADMISSION 6/22 CLINICAL DATA: Foot ulcer, question of osteomyelitis EXAM: MRI OF THE LEFT FOOT WITHOUT CONTRAST TECHNIQUE: Multiplanar, multisequence MR imaging of the left was performed. No intravenous contrast was administered. COMPARISON: Radiograph same day FINDINGS: Bones/Joint/Cartilage Small cystic changes are seen at the fifth metatarsal head, likely degenerative changes. No marrow edema, osseous fracture, or periosteal reaction are seen throughout the osseous structures. Small joint effusion seen at the fifth MTP joint. There appears to be chronic cortical irregularity with erosive type change seen at the fourth metatarsal head which is unchanged since June 28, 2019, however new since 2019. Ligaments The Lisfranc ligaments and collateral ligaments appear to be intact. Muscles and Tendons Diffusely increased signal seen throughout the muscles surrounding the forefoot with fatty atrophy. The flexor and extensor tendons appear to be intact. The plantar fascia is intact. Soft tissues Along the dorsal lateral aspect of the fifth metatarsal shaft there is a focal area superficial ulceration measuring approximately 8 mm in  transverse dimension. There is overlying skin thickening and subcutaneous edema seen. No sinus tract or loculated fluid collection is noted. IMPRESSION: 1. Small focal area of ulceration along the dorsal lateral aspect of the fifth metatarsal shaft. No definite evidence of acute osteomyelitis, sinus tract, or abscess. 2. Progressive chronic erosive cortical destruction seen at the fourth metatarsal head since 2019, however unchanged since June 28, 2019, which could be from chronic osteomyelitis or prior injury. 3. Small joint effusion at the fifth MTP joint. Electronically Signed By: Prudencio Pair M.D. On: 08/02/2019 33:51 54 year old type I diabetic who we had in clinic in late 2019 to February 2020 with osteomyelitis of his left fourth metatarsal head and a wound in this area. This eventually healed with IV antibiotics and hyperbaric oxygen. He was relatively well until February of this year when he developed I believe 2 wounds on the left fifth met head 1 plantar and 1 more laterally. The one more laterally has since closed over but he has been left with a fairly persistent wound in the plantar left fifth met head. Has been following with [name] of podiatry. For an extended period of  time he used Medihoney. He received doxycycline and April at which time a culture showed E. coli and MSSA. An MRI done on August 02, 2019 [shown above] did not show osteomyelitis but did suggest some ongoing destruction at the fourth metatarsal head. He was switched to silver alginate as the primary dressing in May. Earlier this month he received a 2-week course of Bactrim due to erythema around the wound. He was referred to Dr. Alfred Levins of infectious disease who did not think anything was infected and recommended stopping antibiotics. The patient said he felt really well has you finished up the Bactrim but then yesterday morning he became febrile again and noted more erythema around the wound. He was referred  here for our review of this. His temperature today is 99.3. He denies any other infectious symptoms no cough no upper respiratory, no dysuria etc. He has been using silver alginate and offloading this area with a surgical shoe. 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 He apparently had shoulder surgery in the fall 2020 I did not look this up he apparently has hardware in this area. Recent x-ray done in Dr. Pasty Arch office on 6/7 was negative for osteo- ABIs have always been noncompressible. Is not previously been thought that arterial insufficiency is contributing to this. Patient History Information obtained from Patient. Allergies lithium (Severity: Moderate, Reaction: intolerance- uncomfortable feeling), Adderall (Reaction: palpitations), Ritalin (Reaction: palpitations) Family History Cancer - Mother, Diabetes - Maternal Grandparents,Father, Heart Disease - Father, No family history of Hereditary Spherocytosis, Hypertension, Kidney Disease, Lung Disease, Seizures, Stroke, Thyroid Problems, Tuberculosis. Social History Former smoker - quit 30 years ago, Marital Status - Divorced, Alcohol Use - Rarely, Drug Use - No History, Caffeine Use - Daily - coffee. Medical History Eyes Patient has history of Cataracts - right eye removed Denies history of Glaucoma, Optic Neuritis Ear/Nose/Mouth/Throat Denies history of Chronic sinus problems/congestion, Middle ear problems Hematologic/Lymphatic Denies history of Anemia, Hemophilia, Human Immunodeficiency Virus, Lymphedema, Sickle Cell Disease Respiratory Denies history of Aspiration, Asthma, Chronic Obstructive Pulmonary Disease (COPD), Pneumothorax, Sleep Apnea, Tuberculosis Cardiovascular Denies history of Angina, Arrhythmia, Congestive Heart Failure, Coronary Artery Disease, Deep Vein Thrombosis,  Hypertension, Hypotension, Myocardial Infarction, Peripheral Arterial Disease, Peripheral Venous Disease, Phlebitis, Vasculitis Gastrointestinal Denies history of Cirrhosis , Colitis, Crohnoos, Hepatitis A, Hepatitis B, Hepatitis C Endocrine Patient has history of Type I Diabetes - 6 years Denies history of Type II Diabetes Genitourinary Denies history of End Stage Renal Disease Immunological Denies history of Lupus Erythematosus, Raynaudoos, Scleroderma Integumentary (Skin) Denies history of History of Burn Musculoskeletal Patient has history of Osteomyelitis - right ankle Denies history of Gout, Rheumatoid Arthritis, Osteoarthritis Neurologic Patient has history of Neuropathy Denies history of Dementia, Quadriplegia, Paraplegia, Seizure Disorder Oncologic Denies history of Received Chemotherapy, Received Radiation Psychiatric Denies history of Anorexia/bulimia, Confinement Anxiety Patient is treated with Insulin. Blood sugar is tested. Hospitalization/Surgery History - IandD with bone biopsy. - rhinoplasty. - bone spur removed right elbow. - cervical C4-C5 sx. - right shoulder surgery. - left hand surgery. Medical A Surgical History Notes nd Eyes detach retina left eye in 2005 resulting in wearing a contact. 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 Eyes Complains or has symptoms of Glasses / Contacts - left eye.  Ear/Nose/Mouth/Throat Denies complaints or symptoms of Chronic sinus problems or rhinitis. Respiratory Denies complaints or symptoms of Chronic or frequent coughs, Shortness of Breath. Cardiovascular Denies complaints or symptoms of Chest pain. Gastrointestinal Denies complaints or symptoms of Frequent diarrhea, Nausea, Vomiting. Genitourinary Denies complaints or symptoms of  Frequent urination. Integumentary (Skin) Complains or has symptoms of Wounds - left foot. Neurologic Complains or has symptoms of Numbness/parasthesias. Psychiatric Denies complaints or symptoms of Claustrophobia, Suicidal. Objective Constitutional Patient is hypertensive.. Pulse regular and within target range for patient.Marland Kitchen Respirations regular, non-labored and within target range.. Temperature is normal and within the target range for the patient.Marland Kitchen Appears in no distress. Vitals Time Taken: 7:49 AM, Height: 76 in, Source: Stated, Weight: 255 lbs, Source: Stated, BMI: 31, Temperature: 99.3 F, Pulse: 79 bpm, Respiratory Rate: 18 breaths/min, Blood Pressure: 137/76 mmHg, Capillary Blood Glucose: 180 mg/dl. General Notes: glucose per pt report yesterday Eyes Conjunctivae clear. No discharge.no icterus. Respiratory work of breathing is normal. Bilateral breath sounds are clear and equal in all lobes with no wheezes, rales or rhonchi.. Cardiovascular Heart rhythm and rate regular, without murmur or gallop.. Popliteal and femoral pulses palpable. Fetal pulses are normal in the left foot. Psychiatric appears at normal baseline. General Notes: Wound exam; left fifth metatarsal head. Both the size of a quarter. Most of this has healthy granulation center part of this has a small deeper area but this does not go straight to bone but certainly approaches it. There is no purulent drainage. Erythema around the wound as noted above Integumentary (Hair, Skin) There is mild erythema and loss of surface epithelium around the wound area. Somewhat tender even through his neuropathy. Wound #3 status is Open. Original cause of wound was Gradually Appeared. The wound is located on the Left Metatarsal head fifth. The wound measures 1.9cm length x 1.8cm width x 0.4cm depth; 2.686cm^2 area and 1.074cm^3 volume. There is Fat Layer (Subcutaneous Tissue) Exposed exposed. There is no tunneling noted, however,  there is undermining starting at 1:00 and ending at 7:00 with a maximum distance of 0.7cm. There is a medium amount of serosanguineous drainage noted. The wound margin is thickened. There is large (67-100%) red granulation within the wound bed. There is a small (1-33%) amount of necrotic tissue within the wound bed including Adherent Slough. Assessment Active Problems ICD-10 Type 1 diabetes mellitus with foot ulcer Non-pressure chronic ulcer of other part of left foot with other specified severity Type 1 diabetes mellitus with diabetic polyneuropathy Other specified polyneuropathies Plan Follow-up Appointments: Return Appointment in 1 week. Dressing Change Frequency: Change dressing every day. Wound Cleansing: May shower and wash wound with soap and water. Primary Wound Dressing: Wound #3 Left Metatarsal head fifth: Calcium Alginate with Silver Secondary Dressing: Dry Gauze - secure with tape Radiology ordered were: MRI, foot , left. with and with out contast - non healing wound lef tfoot 1. He has an appointment later today with [name]. 2. My suggestion is a repeat MRI of the foot. The return of fever shortly after stopping antibiotics together with the erythema is worrisome. I would wonder about osteomyelitis, septic arthritis etc. 3. I was not totally reassured about the condition of the fourth metatarsal head based on the MRI that was done 3 months ago. Of course osteomyelitis in this area could also be progressing even though there is no wound 4. He is in a surgical shoe. Still works in a Mudlogger position on a bicycle shop. A total contact cast would be ideal  but I would like to make sure that we have all the infection controlled before considering a cast 5. If he has acute osteomyelitis he is certainly a candidate for hyperbaric oxygen last time but would have to return to see Dr. Linus Salmons with regards to antibiotic choices 6. Whether he follows here with Dr. Earleen Newport there  does not seem to be much disagreement about approach. I told him to talk with [name] about this. Would be glad to follow him here if he is comfortable with this. 7. No evidence of an arterial issue I spent 35 minutes to review of this patient's extensive medical records face-to-face evaluation and preparation of this record. Electronic Signature(s) Signed: 11/22/2019 5:20:04 PM By: [name] MD Entered By: [name] on 11/22/2019 08:49:32 -------------------------------------------------------------------------------- HxROS Details Patient Name: Date of Service: [facility] [place] 08/21/252 7:30 A M Medical Record Number: [id number] Patient Account Number: [id number] Date of Birth/Sex: Treating RN: [birth year] (54 y.o. Ernestene Mention Primary Care Provider: [name] Other Clinician: Referring Provider: Treating Provider/Extender: [name], [name] in Treatment: 0 Information Obtained From Patient Constitutional Symptoms ([place]) Complaints and Symptoms: Positive for: Fever - started yesterday Eyes Complaints and Symptoms: Positive for: Glasses / Contacts - left eye Medical History: Positive for: Cataracts - right eye removed Negative for: Glaucoma; Optic Neuritis Past Medical History Notes: detach retina left eye in 2005 resulting in wearing a contact. Ear/Nose/Mouth/Throat Complaints and Symptoms: Negative for: Chronic sinus problems or rhinitis Medical History: Negative for: Chronic sinus problems/congestion; Middle ear problems Respiratory Complaints and Symptoms: Negative for: Chronic or frequent coughs; Shortness of Breath Medical History: Negative for: Aspiration; Asthma; Chronic Obstructive Pulmonary Disease (COPD); Pneumothorax; Sleep Apnea; Tuberculosis Cardiovascular Complaints and Symptoms: Negative for: Chest pain Medical History: Negative for: Angina; Arrhythmia; Congestive Heart Failure; Coronary Artery  Disease; Deep Vein Thrombosis; Hypertension; Hypotension; Myocardial Infarction; Peripheral Arterial Disease; Peripheral Venous Disease; Phlebitis; Vasculitis Gastrointestinal Complaints and Symptoms: Negative for: Frequent diarrhea; Nausea; Vomiting Medical History: Negative for: Cirrhosis ; Colitis; Crohns; Hepatitis A; Hepatitis B; Hepatitis C Past Medical History Notes: hx pancreatitis Genitourinary Complaints and Symptoms: Negative for: Frequent urination Medical History: Negative for: End Stage Renal Disease Integumentary (Skin) Complaints and Symptoms: Positive for: Wounds - left foot Medical History: Negative for: History of Burn Neurologic Complaints and Symptoms: Positive for: Numbness/parasthesias Medical History: Positive for: Neuropathy Negative for: Dementia; Quadriplegia; Paraplegia; Seizure Disorder Past Medical History Notes: 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 Psychiatric Complaints and Symptoms: Negative for: Claustrophobia; Suicidal Medical History: Negative for: Anorexia/bulimia; Confinement Anxiety Past Medical History Notes: ADD Hematologic/Lymphatic Medical History: Negative for: Anemia; Hemophilia; Human Immunodeficiency Virus; Lymphedema; Sickle Cell Disease Endocrine Medical History: Positive for: Type I Diabetes - 6 years Negative for: Type II Diabetes Time with diabetes: 9 years Treated with: Insulin Blood sugar tested every day: Yes Tested : 3 times per day Immunological Medical History: Negative for: Lupus Erythematosus; Raynauds; Scleroderma Musculoskeletal Medical History: Positive for: Osteomyelitis - right ankle Negative for: Gout; Rheumatoid Arthritis; Osteoarthritis Oncologic Medical History: Negative for: Received Chemotherapy; Received Radiation HBO Extended History Items Eyes: Cataracts Immunizations Pneumococcal Vaccine: Received Pneumococcal Vaccination: No Implantable Devices None Hospitalization / Surgery  History Type of Hospitalization/Surgery IandD with bone biopsy rhinoplasty bone spur removed right elbow cervical C4-C5 sx right shoulder surgery left hand surgery Family and Social History Cancer: Yes - Mother; Diabetes: Yes - Maternal Grandparents,Father; Heart Disease: Yes - Father; Hereditary Spherocytosis: No; Hypertension: No; Kidney Disease: No; Lung  Disease: No; Seizures: No; Stroke: No; Thyroid Problems: No; Tuberculosis: No; Former smoker - quit 30 years ago; Marital Status - Divorced; Alcohol Use: Rarely; Drug Use: No History; Caffeine Use: Daily - coffee; Financial Concerns: No; Food, Clothing or Shelter Needs: No; Support System Lacking: No; Transportation Concerns: No Engineer, maintenance) Signed: 11/22/2019 5:20:04 PM By: [name] MD Signed: 11/22/2019 5:28:52 PM By: Baruch Gouty RN, BSN Entered By: Baruch Gouty on 11/22/2019 07:57:22 -------------------------------------------------------------------------------- [place] Details Patient Name: Date of Service: [facility], [place]. 6/96/7893 Medical Record Number: [id number] Patient Account Number: [id number] Date of Birth/Sex: Treating RN: Oct 23, 1965 (53 y.o. [name]) Dolores Lory, Morey Hummingbird Primary Care Provider: [name] Other Clinician: Referring Provider: Treating Provider/Extender: [name], [name] in Treatment: 0 Diagnosis Coding ICD-10 Codes Code Description [id number] Type 1 diabetes mellitus with foot ulcer L97.528 Non-pressure chronic ulcer of other part of left foot with other specified severity E10.42 Type 1 diabetes mellitus with diabetic polyneuropathy G62.89 Other specified polyneuropathies Facility Procedures CPT4 Code: [id number] Description: 99214 - WOUND CARE VISIT-LEV 4 EST PT Modifier: Quantity: 1 Physician Procedures : CPT4 Code Description Modifier [id number] - WC PHYS LEVEL 4 - EST PT ICD-10 Diagnosis Description E10.621 Type 1 diabetes mellitus with foot ulcer  L97.528 Non-pressure chronic ulcer of other part of left foot with other specified severity E10.42  Type 1 diabetes mellitus with diabetic polyneuropathy G62.89 Other specified polyneuropathies Quantity: 1 Electronic Signature(s) Signed: 11/22/2019 5:20:04 PM By: [name] MD Signed: 11/23/2019 4:51:47 PM By: Carlene Coria RN Entered By: Carlene Coria on 11/22/2019 11:20:06

## 2019-11-25 ENCOUNTER — Telehealth: Payer: Self-pay | Admitting: *Deleted

## 2019-11-25 DIAGNOSIS — M869 Osteomyelitis, unspecified: Secondary | ICD-10-CM

## 2019-11-25 MED ORDER — SULFAMETHOXAZOLE-TRIMETHOPRIM 800-160 MG PO TABS: 1.0000 | ORAL_TABLET | Freq: Two times a day (BID) | ORAL | 0 refills | Status: DC

## 2019-11-25 NOTE — Telephone Encounter (Signed)
Pt states he is about out of the antibiotic and pt states Wound care center was going to call orders for MRI but no one has called and Dr. Ardelle Anton states he would order if that occurred.

## 2019-11-25 NOTE — Telephone Encounter (Signed)
Left message informing pt I could refill the Bactrim DS and would have to get orders from Dr. Ardelle Anton for the MRI.

## 2019-11-25 NOTE — Telephone Encounter (Signed)
Pt called and I informed I had called in the antibiotic and sent request for orders for MRI to Dr. Ardelle Anton.

## 2019-11-25 NOTE — Telephone Encounter (Signed)
Can you please order an MRI of the the left foot with contrast if able to evaluate for osteomyelitis. Thanks.   It may be quicker to do at [facility] or [facility].

## 2019-11-27 NOTE — Progress Notes (Signed)
Subjective: 55 year old male with history of type 2 diabetes with neuropathy, Charcot-Marie-Tooth presents the office today for follow evaluation of left foot cellulitis, ulceration.  He is followed up with infectious disease.  He also do wound care this morning.  MRI is been ordered left foot at the wound has become deeper since I last saw him.  There is been some localized erythema he states he had a low-grade fever we started him on antibiotics because of this.  He states the wound care physician did not change his dressings currently.  Awaiting MRI results before pursuing any further treatment.  Denies any fevers, chills, nausea, vomiting.  No calf pain, chest pain, shortness of breath.   Objective: AAO x3, NAD DP/PT pulses palpable bilaterally, CRT less than 3 seconds Ulceration present submetatarsal 5 with hyperkeratotic tissue along the periwound.  After debridement the wound measured 1.7 x 1.5 centimeters somewhat deeper and is a small amount of tendon exposed which is new.  There is localized erythema there is no ascending cellulitis.  Minimal warmth directly on the wound but no warmth of the foot.  No edema to the foot otherwise.  No other open lesions.  Cavus foot type is present.  No pain with calf compression, swelling, warmth, erythema  Assesment: Ulceration of the left foot with localized erythema  Plan: -All treatment options discussed with the patient including all alternatives, risks, complications.  -Awaiting MRI.  Continue current local wound care plans.  Family for further treatment of the wound care center.  He finished the course of antibiotics he is currently taking.  Monitor closely signs or symptoms of worsening infection as per the emergency department should any occur.  Return if symptoms worsen or fail to improve.  Vivi Barrack DPM

## 2019-11-28 ENCOUNTER — Encounter (HOSPITAL_BASED_OUTPATIENT_CLINIC_OR_DEPARTMENT_OTHER): Payer: BLUE CROSS/BLUE SHIELD | Admitting: Internal Medicine

## 2019-11-28 NOTE — Telephone Encounter (Signed)
Pt called states he would like to go to [facility] I asked pt if he was allergic to shellfish, IVP dye or dyes and he denied. Faxed orders for MRI with and without contrast left foot to [facility]

## 2019-11-28 NOTE — Telephone Encounter (Signed)
Left message requesting pt's preferred imaging center and if he was able to take IV contrast for the MRI.

## 2019-11-28 NOTE — Addendum Note (Signed)
Addended by: Alphia Kava D on: 11/28/2019 03:10 PM   Modules accepted: Orders

## 2019-11-29 ENCOUNTER — Ambulatory Visit (HOSPITAL_COMMUNITY)
Admission: RE | Admit: 2019-11-29 | Discharge: 2019-11-29 | Disposition: A | Discharge status: Home / Self Care | Payer: BLUE CROSS/BLUE SHIELD | Source: Ambulatory Visit | Attending: Podiatry | Admitting: Podiatry

## 2019-11-29 ENCOUNTER — Telehealth: Payer: Self-pay | Admitting: Podiatry

## 2019-11-29 ENCOUNTER — Other Ambulatory Visit: Payer: Self-pay | Admitting: Podiatry

## 2019-11-29 ENCOUNTER — Encounter (HOSPITAL_BASED_OUTPATIENT_CLINIC_OR_DEPARTMENT_OTHER): Payer: BLUE CROSS/BLUE SHIELD | Admitting: Internal Medicine

## 2019-11-29 DIAGNOSIS — M869 Osteomyelitis, unspecified: Secondary | ICD-10-CM

## 2019-11-29 DIAGNOSIS — E10621 Type 1 diabetes mellitus with foot ulcer: Secondary | ICD-10-CM | POA: Diagnosis not present

## 2019-11-29 MED ORDER — GADOBUTROL 1 MMOL/ML IV SOLN
10.0000 mL | Freq: Once | INTRAVENOUS | Status: AC | PRN
  Administered 2019-11-29: 10 mL via INTRAVENOUS

## 2019-11-29 NOTE — Telephone Encounter (Signed)
Centralized scheduling [phone] Opt 3

## 2019-11-29 NOTE — Telephone Encounter (Signed)
[employer] [facility] states someone named Sheralyn Boatman change to [facility] but needed the NPI. I gave NPI: [id number] to [facility] on [address] [employer] states [facility] shows up as out-of-network unless due to medical necessity. I told Edson Snowball it was due to medical necessity because it needed to be done STAT due to the possible of bone infection in the left foot. Edson Snowball states the PA is now for STAT and in-network and can be scheduled under the PA [id number], valid:  11/29/2019 - 05/26/2020.

## 2019-11-29 NOTE — Telephone Encounter (Signed)
Alinda Money from the wound care center has called because they are stating that the Dr.Robson wanted to get a MRI STAT and they know insurance has approved on our end but we dont see if pt has been scheduled Alinda Money asked if it would be ok to call insurance and have it changed over to a [facility] facility not the med center Dr.Robson is concerned that this pt has to be seen by Friday 12/02/19 if he is not seen by Friday he will be sent over to the ER

## 2019-11-29 NOTE — Telephone Encounter (Signed)
[facility] Main Scheduling Radiology - Marcelle Smiling states has to be pre-certed to [facility] prior to scheduling.

## 2019-11-29 NOTE — Telephone Encounter (Signed)
I informed pt of [facility] MRI appt today at 2:30pm with imaging at 3:00pm.

## 2019-11-29 NOTE — Telephone Encounter (Signed)
I spoke with [facility] Radiology Main Scheduling - Peggy and informed of the PA change to [facility] and that the MRI needed to be scheduled prior to 12/02/2019. [facility] Radiology Scheduling - Peggy states pt can be seen today at 3:00pm arrive 2:30pm at [facility]

## 2019-11-29 NOTE — Addendum Note (Signed)
Addended by: Alphia Kava D on: 11/29/2019 12:25 PM   Modules accepted: Orders

## 2019-11-29 NOTE — Telephone Encounter (Signed)
[facility] called stating they had tried entering a MRI referral for patient and realized we had already entered one for the patient. They are needing the patient to have the MRI done by Friday and wanted to know if we could go in Epic and update our referral to STAT and then call centralized scheduling to get the patient set up.   MRI will need to be changed to be done at [facility] in order to be done in time.

## 2019-11-30 NOTE — Progress Notes (Signed)
[name] ([id number]) Visit Report for 11/29/2019 Arrival Information Details Patient Name: Date of Service: [name], [place] 11/29/2019 8:00 A M Medical Record Number: [id number] Patient Account Number: [id number] Date of Birth/Sex: Treating RN: 09-Jun-1965 (54 y.o. [name] Primary Care Earl Losee: Ananias Pilgrim Other Clinician: Referring Zohar Maroney: Treating Matthew Cina/Extender: Alta Corning in Treatment: 1 Visit Information History Since Last Visit Added or deleted any medications: No Patient Arrived: Ambulatory Any new allergies or adverse reactions: No Arrival Time: 08:16 Had a fall or experienced change in No Accompanied By: self activities of daily living that may affect Transfer Assistance: None risk of falls: Patient Identification Verified: Yes Signs or symptoms of abuse/neglect since No Secondary Verification Process Completed: Yes last visito Patient Requires Transmission-Based Precautions: No Hospitalized since last visit: No Patient Has Alerts: No Implantable device outside of the clinic No excluding cellular tissue based products placed in the center since last visit: Has Dressing in Place as Prescribed: Yes Has Footwear/Offloading in Place as Yes Prescribed: Right: Surgical Shoe with Pressure Relief Insole Pain Present Now: Yes Electronic Signature(s) Signed: 11/29/2019 5:33:24 PM By: Zenaida Deed RN, BSN Entered By: Zenaida Deed on 11/29/2019 08:17:21 -------------------------------------------------------------------------------- Clinic Level of Care Assessment Details Patient Name: Date of Service: [name], [place] [name] 11/29/2019 8:00 A M Medical Record Number: [id number] Patient Account Number: [id number] Date of Birth/Sex: Treating RN: Dec 10, 1965 (54 y.o. [name]) [name] Primary Care Angel Hobdy: Ananias Pilgrim Other Clinician: Referring [name]: Treating [name]/Extender: [name],  [name] in Treatment: 1 Clinic Level of Care Assessment Items TOOL 4 Quantity Score X- 1 0 Use when only an EandM is performed on FOLLOW-UP visit ASSESSMENTS - Nursing Assessment / Reassessment []  - 0 Reassessment of Co-morbidities (includes updates in patient status) []  - 0 Reassessment of Adherence to Treatment Plan ASSESSMENTS - Wound and Skin A ssessment / Reassessment X - Simple Wound Assessment / Reassessment - one wound 1 5 []  - 0 Complex Wound Assessment / Reassessment - multiple wounds []  - 0 Dermatologic / Skin Assessment (not related to wound area) ASSESSMENTS - Focused Assessment []  - 0 Circumferential Edema Measurements - multi extremities []  - 0 Nutritional Assessment / Counseling / Intervention []  - 0 Lower Extremity Assessment (monofilament, tuning fork, pulses) []  - 0 Peripheral Arterial Disease Assessment (using hand held doppler) ASSESSMENTS - Ostomy and/or Continence Assessment and Care []  - 0 Incontinence Assessment and Management []  - 0 Ostomy Care Assessment and Management (repouching, etc.) PROCESS - Coordination of Care X - Simple Patient / Family Education for ongoing care 1 15 []  - 0 Complex (extensive) Patient / Family Education for ongoing care X- 1 10 Staff obtains , Records, T Results / Process Orders est []  - 0 Staff telephones HHA, Nursing Homes / Clarify orders / etc []  - 0 Routine Transfer to another Facility (non-emergent condition) []  - 0 Routine [name] Admission (non-emergent condition) []  - 0 New Admissions / / Ordering NPWT Apligraf, etc. , []  - 0 Emergency [name] Admission (emergent condition) X- 1 10 Simple Discharge Coordination []  - 0 Complex (extensive) Discharge Coordination PROCESS - Special Needs []  - 0 Pediatric / Minor Patient Management []  - 0 Isolation Patient Management []  - 0 Hearing / Language / Visual special needs []  - 0 Assessment of Community assistance  (transportation, D/C planning, etc.) []  - 0 Additional assistance / Altered mentation []  - 0 Support Surface(s) Assessment (bed, cushion, seat, etc.) INTERVENTIONS - Wound Cleansing / Measurement X -  Simple Wound Cleansing - one wound 1 5 []  - 0 Complex Wound Cleansing - multiple wounds X- 1 5 Wound Imaging (photographs - any number of wounds) []  - 0 Wound Tracing (instead of photographs) X- 1 5 Simple Wound Measurement - one wound []  - 0 Complex Wound Measurement - multiple wounds INTERVENTIONS - Wound Dressings []  - 0 Small Wound Dressing one or multiple wounds X- 1 15 Medium Wound Dressing one or multiple wounds []  - 0 Large Wound Dressing one or multiple wounds X- 1 5 Application of Medications - topical []  - 0 Application of Medications - injection INTERVENTIONS - Miscellaneous []  - 0 External ear exam []  - 0 Specimen Collection (cultures, biopsies, blood, body fluids, etc.) []  - 0 Specimen(s) / Culture(s) sent or taken to Lab for analysis []  - 0 Patient Transfer (multiple staff / / Similar devices) []  - 0 Simple Staple / Suture removal (25 or less) []  - 0 Complex Staple / Suture removal (26 or more) []  - 0 Hypo / Hyperglycemic Management (close monitor of Blood Glucose) []  - 0 Ankle / Brachial Index (ABI) - do not check if billed separately X- 1 5 Vital Signs Has the patient been seen at the [name] within the last three years: Yes Total Score: 80 Level Of Care: New/Established - Level 3 Electronic Signature(s) Signed: 11/29/2019 5:22:48 PM By: RN Entered By: on 11/29/2019 08:57:05 -------------------------------------------------------------------------------- Encounter Discharge Information Details Patient Name: Date of Service: [facility], [name] 11/29/2019 8:00 A M Medical Record Number: Patient Account Number: Date of Birth/Sex: Treating RN: 1965-09-28 (54 y.o. Primary Care [name]: Other Clinician: Referring [name]: Treating [name]/Extender: in Treatment: 1 Encounter Discharge Information Items Discharge Condition: Stable Ambulatory Status: Ambulatory Discharge Destination: Home Transportation: Private Auto Accompanied By: self Schedule Follow-up Appointment: Yes Clinical Summary of Care: Patient Declined Electronic Signature(s) Signed: 11/29/2019 5:23:36 PM By: 12/01/2019 Entered By: [name] on 11/29/2019 09:14:19 -------------------------------------------------------------------------------- Lower Extremity Assessment Details Patient Name: Date of Service: [facility], [name] 11/29/2019 8:00 A M Medical Record Number: 12/01/2019 Patient Account Number: [id number] Date of Birth/Sex: Treating RN: 1965-09-11 (55 y.o. 53 Primary Care Gema Ringold: [name] Other Clinician: Referring Ladan Vanderzanden: Treating Macon Sandiford/Extender: Ananias Pilgrim, [name] in Treatment: 1 Edema Assessment Assessed: [Left: No] [Right: No] Edema: [Left: N] [Right: o] Calf Left: Right: Point of Measurement: cm From Medial Instep 37.5 cm cm Ankle Left: Right: Point of Measurement: cm From Medial Instep 21.8 cm cm Vascular Assessment Pulses: Dorsalis Pedis Palpable: [Left:Yes] Electronic Signature(s) Signed: 11/29/2019 5:33:24 PM By: 12/01/2019 RN, BSN Entered By: Cherylin Mylar on 11/29/2019 08:24:50 -------------------------------------------------------------------------------- Multi Wound Chart Details Patient Name: Date of Service: [facility], [name] 11/29/2019 8:00 A M Medical Record Number: 12/01/2019 Patient Account Number: [id number] Date of Birth/Sex: Treating RN: 14-Aug-1965 (53 y.o. 51) [name] Primary Care Belkis Norbeck: Ananias Pilgrim Other Clinician: Referring Adlee Paar: Treating Markee Remlinger/Extender: [name],  [name] in Treatment: 1 Vital Signs Height(in): 76 Capillary Blood Glucose(mg/dl): 12/01/2019 Weight(lbs): Zenaida Deed Pulse(bpm): 74 Body Mass Index(BMI): 31 Blood Pressure(mmHg): 146/77 Temperature(F): 99.2 Respiratory Rate(breaths/min): 18 Photos: [3:No Photos Left Metatarsal head fifth] [N/A:N/A N/A] Wound Location: [3:Gradually Appeared] [N/A:N/A] Wounding Event: [3:Diabetic Wound/Ulcer of the Lower] [N/A:N/A] Primary Etiology: [3:Extremity Cataracts, Type I Diabetes,] [N/A:N/A] Comorbid History: [3:Osteomyelitis, Neuropathy 08/01/2019] [N/A:N/A] Date Acquired: [3:1] [N/A:N/A] Weeks of Treatment: [3:Open] [N/A:N/A] Wound Status: [3:2.2x1.9x0.4] [N/A:N/A] Measurements  L x W x D (cm) [3:3.283] [N/A:N/A] A (cm) : rea [3:1.313] [N/A:N/A] Volume (cm) : [3:-22.20%] [N/A:N/A] % Reduction in A rea: [3:-22.30%] [N/A:N/A] % Reduction in Volume: [3:Grade 2] [N/A:N/A] Classification: [3:Medium] [N/A:N/A] Exudate A mount: [3:Serosanguineous] [N/A:N/A] Exudate Type: [3:red, brown] [N/A:N/A] Exudate Color: [3:Flat and Intact] [N/A:N/A] Wound Margin: [3:Large (67-100%)] [N/A:N/A] Granulation A mount: [3:Red] [N/A:N/A] Granulation Quality: [3:Small (1-33%)] [N/A:N/A] Necrotic A mount: [3:Fat Layer (Subcutaneous Tissue)] [N/A:N/A] Exposed Structures: [3:Exposed: Yes Tendon: Yes Fascia: No Muscle: No Joint: No Bone: No None] [N/A:N/A] Treatment Notes Electronic Signature(s) Signed: 11/29/2019 5:22:48 PM By: Erik PaxEpps, Carrie RN Signed: 11/30/2019 3:41:00 PM By: Baltazar Najjarobson, Michael MD Entered By: Baltazar Najjarobson, Michael on 11/29/2019 09:04:34 -------------------------------------------------------------------------------- Multi-Disciplinary Care Plan Details [facility]: [facility] [name] 11/29/2019 8:00 A M Medical Record Number: [id number] Patient Account Number: [id number] Date of Birth/Sex: Treating RN: March 04, 1966 (53 y.o. Erik PetitM) Erik PaxEpps, Carrie Primary Care Latiqua Daloia: Ananias PilgrimAsres, Alehegn Other  Clinician: Referring Antavia Tandy: Treating Belisa Eichholz/Extender: Lianne Cureobson, Michael Asres, [name] in Treatment: 1 Active Inactive Wound/Skin Impairment Nursing Diagnoses: Knowledge deficit related to ulceration/compromised skin integrity Goals: Patient/caregiver will verbalize understanding of skin care regimen Date Initiated: 11/22/2019 Target Resolution Date: 12/22/2019 Goal Status: Active Ulcer/skin breakdown will have a volume reduction of 30% by week 4 Date Initiated: 11/22/2019 Target Resolution Date: 12/22/2019 Goal Status: Active Interventions: Assess patient/caregiver ability to obtain necessary supplies Assess patient/caregiver ability to perform ulcer/skin care regimen upon admission and as needed Assess ulceration(s) every visit Notes: Electronic Signature(s) Signed: 11/29/2019 5:22:48 PM By: Erik PaxEpps, Carrie RN Entered By: Erik PaxEpps, Carrie on 11/29/2019 08:05:44 -------------------------------------------------------------------------------- Pain Assessment Details [facility]: [facility] [name] 11/29/2019 8:00 A M Medical Record Number: [id number] Patient Account Number: [id number] Date of Birth/Sex: Treating RN: March 04, 1966 (54 y.o. Damaris SchoonerM) Boehlein, Linda Primary Care Jmarion Christiano: Ananias PilgrimAsres, Alehegn Other Clinician: Referring Tyan Lasure: Treating Yvonnia Tango/Extender: Alta Corningobson, Michael Asres, [name] in Treatment: 1 Active Problems Location of Pain Severity and Description of Pain Patient Has Paino Yes Site Locations Pain Location: Pain Location: Pain in Ulcers With Dressing Change: No Duration of the Pain. Constant / Intermittento Intermittent Rate the pain. Current Pain Level: 0 Worst Pain Level: 2 Least Pain Level: 0 Character of Pain Describe the Pain: Other: sore with ambulation Pain Management and Medication Current Pain Management: Other: tolerable Is the Current Pain Management Adequate: Adequate How does your wound impact your activities of  daily livingo Sleep: No Bathing: No Appetite: No Relationship With Others: No Bladder Continence: No Emotions: No Bowel Continence: No Work: No Toileting: No Drive: No Dressing: No Hobbies: No Electronic Signature(s) Signed: 11/29/2019 5:33:24 PM By: Zenaida DeedBoehlein, Linda RN, BSN Entered By: Zenaida DeedBoehlein, Linda on 11/29/2019 08:19:24 -------------------------------------------------------------------------------- Patient/Caregiver Education Details Patient Name: Date of Service: Erik SpittleFRO [facility], [place] Jilda PandaMUEL B. 6/29/2021andnbsp8:00 A M Medical Record Number: [id number] Patient Account Number: [id number] Date of Birth/Gender: Treating RN: March 04, 1966 (53 y.o. Erik PetitM) Erik PaxEpps, Carrie Primary Care Physician: Ananias PilgrimAsres, Alehegn Other Clinician: Referring Physician: Treating Physician/Extender: Alta Corningobson, Michael Asres, [name] in Treatment: 1 Education Assessment Education Provided To: Patient Education Topics Provided Wound/Skin Impairment: Methods: Explain/Verbal Responses: State content correctly Electronic Signature(s) Signed: 11/29/2019 5:22:48 PM By: Erik PaxEpps, Carrie RN Entered By: Erik PaxEpps, Carrie on 11/29/2019 08:05:59 -------------------------------------------------------------------------------- Wound Assessment Details Patient Name: [facility] [name] 11/29/2019 8:00 A M Medical Record Number: [id number] Patient Account Number: [id number] Date of Birth/Sex: Treating RN: March 04, 1966 (54 y.o. Damaris SchoonerM) Boehlein, Linda Primary Care Diamonte Stavely: Ananias PilgrimAsres, Alehegn Other Clinician: Referring Oz Gammel: Treating Shah Insley/Extender: Leanord Hawkingobson,  Josefina Do, [name] in Treatment: 1 Wound Status Wound Number: 3 Primary Etiology: Diabetic Wound/Ulcer of the Lower Extremity Wound Location: Left Metatarsal head fifth Wound Status: Open Wounding Event: Gradually Appeared Comorbid History: Cataracts, Type I Diabetes, Osteomyelitis, Neuropathy Date Acquired: 08/01/2019 Weeks Of Treatment:  1 Clustered Wound: No Photos Photo Uploaded By: Benjaman Kindler on 11/30/2019 13:37:02 Wound Measurements Length: (cm) 2.2 % R Width: (cm) 1.9 % R Depth: (cm) 0.4 Epi Area: (cm) 3.283 Tu Volume: (cm) 1.313 Un eduction in Area: -22.2% eduction in Volume: -22.3% thelialization: None nneling: No dermining: No Wound Description Classification: Grade 2 Fou Wound Margin: Flat and Intact Slo Exudate Amount: Medium Exudate Type: Serosanguineous Exudate Color: red, brown l Odor After Cleansing: No ugh/Fibrino No Wound Bed Granulation Amount: Large (67-100%) Exposed Structure Granulation Quality: Red Fascia Exposed: No Necrotic Amount: Small (1-33%) Fat Layer (Subcutaneous Tissue) Exposed: Yes Necrotic Quality: Adherent Slough Tendon Exposed: Yes Muscle Exposed: No Joint Exposed: No Bone Exposed: No Treatment Notes Wound #3 (Left Metatarsal head fifth) 1. Cleanse With Wound Cleanser 2. Periwound Care Skin Prep 3. Primary Dressing Applied Calcium Alginate Ag 4. Secondary Dressing Dry Gauze Roll Gauze 5. Secured With Tape 7. Footwear/Offloading device applied Surgical shoe Notes netting. Instructed patient to remove silver aliginate (and clean wound bed) before MRI. T place 4x4/kerlix or tape over site for procedure. o Patient informed staff member that he removes dressing at night and leaves it open to air ("for it to breathe"), educated patient on the importance of keeping wound bed clean and that dressing ordered should be applied to site and that wound should not be left open to air. MD was notified. Patient agreed and stated full understanding. Electronic Signature(s) Signed: 11/29/2019 5:33:24 PM By: Zenaida Deed RN, BSN Entered By: Zenaida Deed on 11/29/2019 08:25:51 -------------------------------------------------------------------------------- Vitals Details Patient Name: Date of Service: [facility], [name] 11/29/2019 8:00 A M Medical Record  Number: [id number] Patient Account Number: [id number] Date of Birth/Sex: Treating RN: Oct 17, 1965 (54 y.o. [name] Primary Care Charlise Giovanetti: Ananias Pilgrim Other Clinician: Referring Leahna Hewson: Treating Inika Bellanger/Extender: [name], [name] in Treatment: 1 Vital Signs Time Taken: 08:17 Temperature (F): 99.2 Height (in): 76 Pulse (bpm): 74 Source: Stated Respiratory Rate (breaths/min): 18 Weight (lbs): 255 Blood Pressure (mmHg): 146/77 Source: Stated Capillary Blood Glucose (mg/dl): 676 Body Mass Index (BMI): 31 Reference Range: 80 - 120 mg / dl Notes glucose per pt report this am Electronic Signature(s) Signed: 11/29/2019 5:33:24 PM By: Zenaida Deed RN, BSN Entered By: Zenaida Deed on 11/29/2019 08:18:06

## 2019-11-30 NOTE — Progress Notes (Signed)
[name] ([id number]) Visit Report for 11/29/2019 HPI Details Patient Name: Date of Service: [facility], [place]. 6/71/2458 8:00 A M Medical Record Number: [id number] Patient Account Number: [id number] Date of Birth/Sex: Treating RN: 1966/01/26 (53 y.o. Jerilynn Mages) Carlene Coria Primary Care Provider: [name] Other Clinician: Referring Provider: Treating Provider/Extender: [name], [name] in Treatment: 1 History of Present Illness HPI Description: ADMISSION 05/04/2018 This is a 54 year old man who apparently is a type I diabetic confirmed by appropriate serology testing 6 years ago. He is therefore on insulin. His most recent hemoglobin A1c was 10.7 on 03/27/2018. 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 As a result of a combination of this he is insensate and even has reduced sensation in his upper extremities. His current problem started in early July he noted a blister over the left fourth plantar metatarsal head. He saw [name] his podiatrist on 01/07/2018. At that time he had a wound that measured 2 x 1 x 0.4 cm. An x-ray did not show osteomyelitis. It did not appear that he actually followed up. He was admitted to hospital on 03/25/2018 through 03/30/2018 with his left foot painful and swollen. He underwent a surgical IandD by [name]. I believe his culture at that time showed Enterococcus faecalis and a bone culture showed enterococcus avium. He is been reviewed by [name] of infectious disease although I have not reviewed his note and he has been on 6 weeks worth of ertapenem which should finish sometime apparently on December 9.. MRI suggested possibility of septic arthritis but did not comment specifically on osteomyelitis. A more recent wound culture was negative. The patient has been  referred here for consideration among other things of hyperbaric oxygen versus proceeding with a partial ray amputation. This would involve excision of the fourth metatarsal head. The patient is offloading this and a Darco forefoot off loader. I think he is using iodoform packing. 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 Certainly would be a possibility of a total contact cast. I did not see any arterial studies on him however his peripheral pulses are palpable 05/11/2018; patient started his hyperbarics on Friday but did not dive yesterday for personal issues. He saw [name] on 12//19 he notes that during the hospitalization in October 2019 he had an abscess and underwent a bone biopsy with culture that showed group B strep, enterococcus avium and Enterococcus faecalis. There was Prevotella from the abscess. He was put on ertapenem for 6 weeks which he completed. Per [name] notable for the fact that he is not been treated with ampicillin or amoxicillin. His hemoglobin A1c was noted to be high at 10.7. He is started on ampicillin sulbactam every 6 again I think this is for better coverage for the enterococcus. We started him on silver alginate as there is still purulent looking drainage coming out of the wound and there is indeed exposed bone. The patient has not been unwell 05/18/2018; patient continues with hyperbarics he is tolerating this well and has no complaints. He is on Unasyn every 6. We have been using silver alginate to the area over the fourth plantar metatarsal head. He is a poorly controlled diabetic. There is certainly less purulent drainage than when I first saw this man at the beginning of the month. He only sedimentation rate I see on  him was 40 on 03/26/2018. States his blood sugars are under better control in the 120s to 130 range. Follows with his endocrinologist in [facility] I change his primary dressing to Prisma today attempting to increase the  granulation. After I left the room he called [place] back into look at a area that he noticed on the right foot 2 days ago. Very superficial and in the medial aspect of his high plantar arch [secondary to his neuropathy] he has a custom insert in the shoe on the right foot. 05/24/2018; continues with HBO doing well. He saw infectious disease today and is still on Unasyn every 6 hours. Not making a lot of progress on the left foot with Prisma I changed him to silver alginate strips. The new area on the right midfoot he is putting silver alginate on this as well. 06/01/2018; patient remains on Unasyn. Not making a lot of progress with granulation. I am going to put him in a total contact cast use Prisma for now. He has a high co-pay for an advanced treatment option but I suspect feeling in this wound may come to that 06/04/2018; patient back for the obligatory first total contact cast change. We continue to use Prisma 06/08/2018; the patient is tolerating the cast well although he had some loosening over the tibia which rubbed when he walked. There is no lesion here. We have been using Prisma under the total contact cast. He is in hyperbarics which she is tolerating well 06/15/2018; tolerating total contact cast and hyperbarics well. He continues IV antibiotics and for the underlying osteomyelitis as directed by infectious disease. His wound is making really nice progress 1/21; Patient's wound is down in depth. Using silver collagen. Ab's end this week and Pic line is to be d/ced 1/28; 0.8 cm in depth which is more than last time. We have been using silver collagen under total contact cast. He continues in hyperbarics. His IV antibiotics are finished PICC line has been removed 2/4; surprisingly today the area is actually epithelialized. There is no open area. There is however a divot where the epithelium is gone down to close the wound surface. I am not completely convinced that this is going to remain viable  over time but I think time is the best thing to determine this. There is no evidence of surrounding infection. As of today I do not think a repeat MRI or any other procedure is going to be indicative of whether this is going to maintain a closed state. I am going to continue him in a total contact cast. He will complete HBO. Next week I will allow him perhaps to graduate into a Darco forefoot off loader, then a healing sandal. I will then send him back to podiatry to see if they can fashion an offloading shoe if this area remains closed 2/10; the patient was taken out of the cast today the area is healed. He has a divot however this is fully epithelialized and the epithelialized tissue looks healthy. I have put him into a Darco forefoot off loader. He will complete his last 2 hyperbaric treatments. I will see him next week and I may transition him into a surgical shoe versus his own shoe. He is following up with [name] of podiatry and I think a diabetic shoe with custom inserts could be provided with the signature provided by his endocrinologist ADMISSION 6/22 CLINICAL DATA: Foot ulcer, question of osteomyelitis EXAM: MRI OF THE LEFT FOOT WITHOUT CONTRAST TECHNIQUE:  Multiplanar, multisequence MR imaging of the left was performed. No intravenous contrast was administered. COMPARISON: Radiograph same day FINDINGS: Bones/Joint/Cartilage Small cystic changes are seen at the fifth metatarsal head, likely degenerative changes. No marrow edema, osseous fracture, or periosteal reaction are seen throughout the osseous structures. Small joint effusion seen at the fifth MTP joint. There appears to be chronic cortical irregularity with erosive type change seen at the fourth metatarsal head which is unchanged since June 28, 2019, however new since 2019. Ligaments The Lisfranc ligaments and collateral ligaments appear to be intact. Muscles and Tendons Diffusely increased signal seen throughout  the muscles surrounding the forefoot with fatty atrophy. The flexor and extensor tendons appear to be intact. The plantar fascia is intact. Soft tissues Along the dorsal lateral aspect of the fifth metatarsal shaft there is a focal area superficial ulceration measuring approximately 8 mm in transverse dimension. There is overlying skin thickening and subcutaneous edema seen. No sinus tract or loculated fluid collection is noted. IMPRESSION: 1. Small focal area of ulceration along the dorsal lateral aspect of the fifth metatarsal shaft. No definite evidence of acute osteomyelitis, sinus tract, or abscess. 2. Progressive chronic erosive cortical destruction seen at the fourth metatarsal head since 2019, however unchanged since June 28, 2019, which could be from chronic osteomyelitis or prior injury. 3. Small joint effusion at the fifth MTP joint. Electronically Signed By: Prudencio Pair M.D. On: 08/02/2019 1:19 54 year old type I diabetic who we had in clinic in late 2019 to February 2020 with osteomyelitis of his left fourth metatarsal head and a wound in this area. This eventually healed with IV antibiotics and hyperbaric oxygen. He was relatively well until February of this year when he developed I believe 2 wounds on the left fifth met head 1 plantar and 1 more laterally. The one more laterally has since closed over but he has been left with a fairly persistent wound in the plantar left fifth met head. Has been following with [name] of podiatry. For an extended period of time he used Medihoney. He received doxycycline and April at which time a culture showed E. coli and MSSA. An MRI done on August 02, 2019 [shown above] did not show osteomyelitis but did suggest some ongoing destruction at the fourth metatarsal head. He was switched to silver alginate as the primary dressing in May. Earlier this month he received a 2-week course of Bactrim due to erythema around the wound. He was  referred to Dr. Alfred Levins of infectious disease who did not think anything was infected and recommended stopping antibiotics. The patient said he felt really well has you finished up the Bactrim but then yesterday morning he became febrile again and noted more erythema around the wound. He was referred here for our review of this. His temperature today is 99.3. He denies any other infectious symptoms no cough no upper respiratory, no dysuria etc. He has been using silver alginate and offloading this area with a surgical shoe. 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 He apparently had shoulder surgery in the fall 2020 I did not look this up he apparently has hardware in this area. Recent x-ray done in Dr. Pasty Arch office on 6/7 was negative for osteo- ABIs have always been noncompressible. Is not previously been thought that arterial insufficiency is contributing to this. 6/29; patient returns the clinic. I thought he might be going  to see Dr. Earleen Newport ongoing for this wound but he is back today. He did see Dr. Earleen Newport on 11/22/2019 I believe he agreed with the MRI and apparently was making some arrangements to have this done in [facility] which I am fine with if they can compare with the study of March 2. In any case he returns with a clinic of the wound is not as good. He has been using silver alginate. He is still on another antibiotic that he says Dr. Earleen Newport prescribed Electronic Signature(s) Signed: 11/30/2019 3:41:00 PM By: [name] MD Entered By: [name] on 11/29/2019 09:06:31 -------------------------------------------------------------------------------- Physical Exam Details Patient Name: Date of Service: [facility] [place], [place] 0/97/3532 8:00 A M Medical Record Number: [id number] Patient Account Number: [id number] Date of Birth/Sex:  Treating RN: 16-Jul-1965 (53 y.o. Oval Linsey Primary Care Provider: [name] Other Clinician: Referring Provider: Treating Provider/Extender: [name], [name] in Treatment: 1 Constitutional Patient is hypertensive.. Pulse regular and within target range for patient.Marland Kitchen Respirations regular, non-labored and within target range.. Temperature is normal and within the target range for the patient.Marland Kitchen Appears in no distress. Respiratory work of breathing is normal. Cardiovascular Pedal pulses palpable. Integumentary (Hair, Skin) Erythema around the wound as noted. Notes Wound exam; the wound is deteriorated. There is exposed tendon at the base of this cone-shaped wound underneath this I think this goes straight to bone. He has erythema around the wound some tenderness over the plantar MTP I have marked the area of erythema. Electronic Signature(s) Signed: 11/30/2019 3:41:00 PM By: [name] MD Entered By: [name] on 11/29/2019 09:08:45 -------------------------------------------------------------------------------- Physician Orders Details Patient Name: Date of Service: [facility], [place]. 1/96/2229 8:00 A M Medical Record Number: [id number] Patient Account Number: [id number] Date of Birth/Sex: Treating RN: 08-17-1965 (53 y.o. Jerilynn Mages) Carlene Coria Primary Care Provider: [name] Other Clinician: Referring Provider: Treating Provider/Extender: [name], [name] in Treatment: 1 Verbal / Phone Orders: No Diagnosis Coding ICD-10 Coding Code Description E10.621 Type 1 diabetes mellitus with foot ulcer L97.528 Non-pressure chronic ulcer of other part of left foot with other specified severity E10.42 Type 1 diabetes mellitus with diabetic polyneuropathy G62.89 Other specified polyneuropathies Follow-up Appointments Return Appointment in 1 week. Dressing Change Frequency Change dressing every day. Wound Cleansing May shower  and wash wound with soap and water. Primary Wound Dressing Wound #3 Left Metatarsal head fifth Calcium Alginate with Silver Secondary Dressing Dry Gauze - secure with tape Radiology Magnetic Resonance Imaging (MRI) with and without contrast left foot ASAP - non healing wound left foot - (ICD10 E10.621 - Type 1 diabetes mellitus with foot ulcer) Electronic Signature(s) Signed: 11/29/2019 5:22:48 PM By: Carlene Coria RN Signed: 11/30/2019 3:41:00 PM By: [name] MD Entered By: Carlene Coria on 11/29/2019 08:53:05 Prescription 11/29/2019 -------------------------------------------------------------------------------- Theone Stanley B. [name] MD Patient Name: Provider: 1966-01-17 [id number] Date of Birth: NPI#: Jerilynn Mages [id number] Sex: DEA #: [phone] Phone #: License #: [place] Patient Address: [address], [place] [address] [address], [place] [address] [phone] Allergies Name Reaction Severity lithium intolerance- uncomfortable feeling Moderate Adderall palpitations Ritalin palpitations Provider's Orders Magnetic Resonance Imaging (MRI) with and without contrast left foot ASAP - ICD10: E10.621 - non healing wound left foot Hand Signature: Date(s): Electronic Signature(s) Signed: 11/29/2019 5:22:48 PM By: Carlene Coria RN Signed: 11/30/2019 3:41:00 PM By: [name] MD Entered By: Carlene Coria on 11/29/2019 08:53:05 --------------------------------------------------------------------------------  Problem List Details Patient Name: Date of Service: [facility], [place] 12/06/2374 8:00 A M Medical Record Number: [id number] Patient Account Number: [id number] Date of Birth/Sex: Treating RN: 11/11/1965 (53 y.o. Jerilynn Mages) Carlene Coria Primary Care Provider: [name] Other Clinician: Referring Provider: Treating Provider/Extender: [name], [name] in  Treatment: 1 Active Problems ICD-10 Encounter Code Description Active Date MDM Diagnosis E10.621 Type 1 diabetes mellitus with foot ulcer 11/22/2019 No Yes L97.528 Non-pressure chronic ulcer of other part of left foot with other specified 11/22/2019 No Yes severity E10.42 Type 1 diabetes mellitus with diabetic polyneuropathy 11/22/2019 No Yes G62.89 Other specified polyneuropathies 11/22/2019 No Yes Inactive Problems Resolved Problems Electronic Signature(s) Signed: 11/30/2019 3:41:00 PM By: [name] MD Entered By: [name] on 11/29/2019 09:04:22 -------------------------------------------------------------------------------- Progress Note Details Patient Name: Date of Service: [facility], [place] 11/06/3708 8:00 A M Medical Record Number: [id number] Patient Account Number: [id number] Date of Birth/Sex: Treating RN: [birth year] (53 y.o. Jerilynn Mages) Carlene Coria Primary Care Provider: [name] Other Clinician: Referring Provider: Treating Provider/Extender: [name], [name] in Treatment: 1 Subjective History of Present Illness (HPI) ADMISSION 05/04/2018 This is a 54 year old man who apparently is a type I diabetic confirmed by appropriate serology testing 6 years ago. He is therefore on insulin. His most recent hemoglobin A1c was 10.7 on 03/27/2018. 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 As a result of a combination of this he is insensate and even has reduced sensation in his upper extremities. His current problem started in early July he noted a blister over the left fourth plantar metatarsal head. He saw [name] his podiatrist on 01/07/2018. At that time he had a wound that measured 2 x 1 x 0.4 cm. An x-ray did not show osteomyelitis. It did not appear that he actually followed up. He was  admitted to hospital on 03/25/2018 through 03/30/2018 with his left foot painful and swollen. He underwent a surgical IandD by [name]. I believe his culture at that time showed Enterococcus faecalis and a bone culture showed enterococcus avium. He is been reviewed by [name] of infectious disease although I have not reviewed his note and he has been on 6 weeks worth of ertapenem which should finish sometime apparently on December 9.. MRI suggested possibility of septic arthritis but did not comment specifically on osteomyelitis. A more recent wound culture was negative. The patient has been referred here for consideration among other things of hyperbaric oxygen versus proceeding with a partial ray amputation. This would involve excision of the fourth metatarsal head. The patient is offloading this and a Darco forefoot off loader. I think he is using iodoform packing. 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 Certainly would be a possibility of a total contact cast. I did not see any arterial studies on him however his peripheral pulses are palpable 05/11/2018; patient started his hyperbarics on Friday but did not dive yesterday for personal issues. He saw [name] on 12//19 he notes that during the hospitalization in October 2019 he had an abscess and underwent a bone biopsy with culture that showed group B strep, enterococcus avium and Enterococcus faecalis. There was Prevotella from the abscess. He was put on ertapenem for 6 weeks which he completed. Per [name] notable for the fact that he is not been treated with ampicillin or  amoxicillin. His hemoglobin A1c was noted to be high at 10.7. He is started on ampicillin sulbactam every 6 again I think this is for better coverage for the enterococcus. We started him on silver alginate as there is still purulent looking drainage coming out of the wound and there is indeed exposed bone. The patient has not  been unwell 05/18/2018; patient continues with hyperbarics he is tolerating this well and has no complaints. He is on Unasyn every 6. We have been using silver alginate to the area over the fourth plantar metatarsal head. He is a poorly controlled diabetic. There is certainly less purulent drainage than when I first saw this man at the beginning of the month. He only sedimentation rate I see on him was 40 on 03/26/2018. States his blood sugars are under better control in the 120s to 130 range. Follows with his endocrinologist in [facility] I change his primary dressing to Prisma today attempting to increase the granulation. After I left the room he called [place] back into look at a area that he noticed on the right foot 2 days ago. Very superficial and in the medial aspect of his high plantar arch [secondary to his neuropathy] he has a custom insert in the shoe on the right foot. 05/24/2018; continues with HBO doing well. He saw infectious disease today and is still on Unasyn every 6 hours. Not making a lot of progress on the left foot with Prisma I changed him to silver alginate strips. The new area on the right midfoot he is putting silver alginate on this as well. 06/01/2018; patient remains on Unasyn. Not making a lot of progress with granulation. I am going to put him in a total contact cast use Prisma for now. He has a high co-pay for an advanced treatment option but I suspect feeling in this wound may come to that 06/04/2018; patient back for the obligatory first total contact cast change. We continue to use Prisma 06/08/2018; the patient is tolerating the cast well although he had some loosening over the tibia which rubbed when he walked. There is no lesion here. We have been using Prisma under the total contact cast. He is in hyperbarics which she is tolerating well 06/15/2018; tolerating total contact cast and hyperbarics well. He continues IV antibiotics and for the underlying osteomyelitis as  directed by infectious disease. His wound is making really nice progress 1/21; Patient's wound is down in depth. Using silver collagen. Ab's end this week and Pic line is to be d/ced 1/28; 0.8 cm in depth which is more than last time. We have been using silver collagen under total contact cast. He continues in hyperbarics. His IV antibiotics are finished PICC line has been removed 2/4; surprisingly today the area is actually epithelialized. There is no open area. There is however a divot where the epithelium is gone down to close the wound surface. I am not completely convinced that this is going to remain viable over time but I think time is the best thing to determine this. There is no evidence of surrounding infection. As of today I do not think a repeat MRI or any other procedure is going to be indicative of whether this is going to maintain a closed state. I am going to continue him in a total contact cast. He will complete HBO. Next week I will allow him perhaps to graduate into a Darco forefoot off loader, then a healing sandal. I will then send him back to  podiatry to see if they can fashion an offloading shoe if this area remains closed 2/10; the patient was taken out of the cast today the area is healed. He has a divot however this is fully epithelialized and the epithelialized tissue looks healthy. I have put him into a Darco forefoot off loader. He will complete his last 2 hyperbaric treatments. I will see him next week and I may transition him into a surgical shoe versus his own shoe. He is following up with [name] of podiatry and I think a diabetic shoe with custom inserts could be provided with the signature provided by his endocrinologist ADMISSION 6/22 CLINICAL DATA: Foot ulcer, question of osteomyelitis EXAM: MRI OF THE LEFT FOOT WITHOUT CONTRAST TECHNIQUE: Multiplanar, multisequence MR imaging of the left was performed. No intravenous contrast was  administered. COMPARISON: Radiograph same day FINDINGS: Bones/Joint/Cartilage Small cystic changes are seen at the fifth metatarsal head, likely degenerative changes. No marrow edema, osseous fracture, or periosteal reaction are seen throughout the osseous structures. Small joint effusion seen at the fifth MTP joint. There appears to be chronic cortical irregularity with erosive type change seen at the fourth metatarsal head which is unchanged since June 28, 2019, however new since 2019. Ligaments The Lisfranc ligaments and collateral ligaments appear to be intact. Muscles and Tendons Diffusely increased signal seen throughout the muscles surrounding the forefoot with fatty atrophy. The flexor and extensor tendons appear to be intact. The plantar fascia is intact. Soft tissues Along the dorsal lateral aspect of the fifth metatarsal shaft there is a focal area superficial ulceration measuring approximately 8 mm in transverse dimension. There is overlying skin thickening and subcutaneous edema seen. No sinus tract or loculated fluid collection is noted. IMPRESSION: 1. Small focal area of ulceration along the dorsal lateral aspect of the fifth metatarsal shaft. No definite evidence of acute osteomyelitis, sinus tract, or abscess. 2. Progressive chronic erosive cortical destruction seen at the fourth metatarsal head since 2019, however unchanged since June 28, 2019, which could be from chronic osteomyelitis or prior injury. 3. Small joint effusion at the fifth MTP joint. Electronically Signed By: Prudencio Pair M.D. On: 08/02/2019 22:74 54 year old type I diabetic who we had in clinic in late 2019 to February 2020 with osteomyelitis of his left fourth metatarsal head and a wound in this area. This eventually healed with IV antibiotics and hyperbaric oxygen. He was relatively well until February of this year when he developed I believe 2 wounds on the left fifth met head 1 plantar  and 1 more laterally. The one more laterally has since closed over but he has been left with a fairly persistent wound in the plantar left fifth met head. Has been following with [name] of podiatry. For an extended period of time he used Medihoney. He received doxycycline and April at which time a culture showed E. coli and MSSA. An MRI done on August 02, 2019 [shown above] did not show osteomyelitis but did suggest some ongoing destruction at the fourth metatarsal head. He was switched to silver alginate as the primary dressing in May. Earlier this month he received a 2-week course of Bactrim due to erythema around the wound. He was referred to Dr. Alfred Levins of infectious disease who did not think anything was infected and recommended stopping antibiotics. The patient said he felt really well has you finished up the Bactrim but then yesterday morning he became febrile again and noted more erythema around the wound. He was referred here for  our review of this. His temperature today is 99.3. He denies any other infectious symptoms no cough no upper respiratory, no dysuria etc. He has been using silver alginate and offloading this area with a surgical shoe. 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 He apparently had shoulder surgery in the fall 2020 I did not look this up he apparently has hardware in this area. Recent x-ray done in Dr. Pasty Arch office on 6/7 was negative for osteo- ABIs have always been noncompressible. Is not previously been thought that arterial insufficiency is contributing to this. 6/29; patient returns the clinic. I thought he might be going to see Dr. Earleen Newport ongoing for this wound but he is back today. He did see Dr. Earleen Newport on 11/22/2019 I believe he agreed with the MRI and apparently was making some arrangements to have this done in [facility] which I am fine with if they can compare with the study of March 2. In any case he returns with a clinic of the wound is not as good. He has been using silver alginate. He is still on another antibiotic that he says Dr. Earleen Newport prescribed Objective Constitutional Patient is hypertensive.. Pulse regular and within target range for patient.Marland Kitchen Respirations regular, non-labored and within target range.. Temperature is normal and within the target range for the patient.Marland Kitchen Appears in no distress. Vitals Time Taken: 8:17 AM, Height: 76 in, Source: Stated, Weight: 255 lbs, Source: Stated, BMI: 31, Temperature: 99.2 F, Pulse: 74 bpm, Respiratory Rate: 18 breaths/min, Blood Pressure: 146/77 mmHg, Capillary Blood Glucose: 136 mg/dl. General Notes: glucose per pt report this am Respiratory work of breathing is normal. Cardiovascular Pedal pulses palpable. General Notes: Wound exam; the wound is deteriorated. There is exposed tendon at the base of this cone-shaped wound underneath this I think this goes straight to bone. He has erythema around the wound some tenderness over the plantar MTP I have marked the area of erythema. Integumentary (Hair, Skin) Erythema around the wound as noted. Wound #3 status is Open. Original cause of wound was Gradually Appeared. The wound is located on the Left Metatarsal head fifth. The wound measures 2.2cm length x 1.9cm width x 0.4cm depth; 3.283cm^2 area and 1.313cm^3 volume. There is tendon and Fat Layer (Subcutaneous Tissue) Exposed exposed. There is no tunneling or undermining noted. There is a medium amount of serosanguineous drainage noted. The wound margin is flat and intact. There is large (67-100%) red granulation within the wound bed. There is a small (1-33%) amount of necrotic tissue within the wound bed including Adherent Slough. Assessment Active Problems ICD-10 Type 1 diabetes mellitus with foot ulcer Non-pressure chronic ulcer of other part of left  foot with other specified severity Type 1 diabetes mellitus with diabetic polyneuropathy Other specified polyneuropathies Plan Follow-up Appointments: Return Appointment in 1 week. Dressing Change Frequency: Change dressing every day. Wound Cleansing: May shower and wash wound with soap and water. Primary Wound Dressing: Wound #3 Left Metatarsal head fifth: Calcium Alginate with Silver Secondary Dressing: Dry Gauze - secure with tape Radiology ordered were: Magnetic Resonance Imaging (MRI) with and without contrast left foot ASAP - non healing wound left foot 1. Continue with silver alginate 2. Apparently the patient is taking the dressing off and leaving it open to air for 12 hours I impressed on him not to do this. If he is doing this he is probably also  walking and is barefoot 3. He has not heard anything about the MRI we ordered last week and Dr. Earleen Newport was also trying to arrange. 4. I am going to message Dr. Linus Salmons about this, I would be surprised that he does not have osteomyelitis and/or septic arthritis in this area. I think he is back on Bactrim prescribed by Dr. Earleen Newport but I am not sure 5. Finally he is still working I have asked him to offload this. I am not sure about the success of this. 6. If we are still treading water by this towards the end of the week were going to need to send him to the ER Electronic Signature(s) Signed: 11/30/2019 3:41:00 PM By: [name] MD Entered By: [name] on 11/29/2019 09:10:44 -------------------------------------------------------------------------------- SuperBill Details Patient Name: Date of Service: [facility] [place] B. 0/22/1798 Medical Record Number: [id number] Patient Account Number: [id number] Date of Birth/Sex: Treating RN: 11/22/1965 (53 y.o. Oval Linsey Primary Care Provider: [name] Other Clinician: Referring Provider: Treating Provider/Extender: [name], [name] in Treatment:  1 Diagnosis Coding ICD-10 Codes Code Description E10.621 Type 1 diabetes mellitus with foot ulcer L97.528 Non-pressure chronic ulcer of other part of left foot with other specified severity E10.42 Type 1 diabetes mellitus with diabetic polyneuropathy G62.89 Other specified polyneuropathies Facility Procedures CPT4 Code: [id number] Description: 99213 - WOUND CARE VISIT-LEV 3 EST PT Modifier: Quantity: 1 Physician Procedures : CPT4 Code Description Modifier [id number] - WC PHYS LEVEL 4 - EST PT ICD-10 Diagnosis Description E10.621 Type 1 diabetes mellitus with foot ulcer L97.528 Non-pressure chronic ulcer of other part of left foot with other specified severity Quantity: 1 Electronic Signature(s) Signed: 11/30/2019 3:41:00 PM By: [name] MD Entered By: [name] on 11/29/2019 [place]

## 2019-12-01 ENCOUNTER — Telehealth: Payer: Self-pay | Admitting: Podiatry

## 2019-12-01 NOTE — Telephone Encounter (Signed)
Attempted to call patient. Left VM to call back.  

## 2019-12-01 NOTE — Telephone Encounter (Signed)
Pt called and wanted to update that he had his MRI  And wanted to say that his foot is still not doing good and he  Will run out of antibiotics on Sunday wanted to know what he wants to do will he need a refill

## 2019-12-02 ENCOUNTER — Telehealth: Payer: Self-pay | Admitting: Podiatry

## 2019-12-02 MED ORDER — SILVER SULFADIAZINE 1 % EX CREA: 1.0000 "application " | TOPICAL_CREAM | Freq: Every day | CUTANEOUS | 3 refills | Status: DC

## 2019-12-02 MED ORDER — AMOXICILLIN-POT CLAVULANATE 875-125 MG PO TABS: 1.0000 | ORAL_TABLET | Freq: Two times a day (BID) | ORAL | 0 refills | Status: DC

## 2019-12-02 NOTE — Telephone Encounter (Signed)
Attempted to call patient again. Phone went straight to VM. Left message to call back. As I am going on vacation, I have given him my cell number to call me back.

## 2019-12-02 NOTE — Telephone Encounter (Signed)
Pt states he missed Dr. Gabriel Rung call and called the [facility] office but Dr. Ardelle Anton was gone. I told pt I would call Dr. Ardelle Anton and have him call.

## 2019-12-02 NOTE — Addendum Note (Signed)
Addended by: Alphia Kava D on: 12/02/2019 03:39 PM   Modules accepted: Orders

## 2019-12-02 NOTE — Telephone Encounter (Addendum)
Dr. Ardelle Anton states pt is in [place] on the way to the beach, order Augmentin 875 #20 one capsule bid, and Silvadene cream for daily dressing changes to [facility] [address], and [facility] corner, and appt with Dr. Lilian Kapur next week.

## 2019-12-02 NOTE — Telephone Encounter (Signed)
Attempted to call the patient to go over the MRI results. Left VM again to call back.   (FYI- I have confirmed his phone number and it is his name on the VM)

## 2019-12-02 NOTE — Telephone Encounter (Signed)
Left message for Dr. Ardelle Anton to contact pt.

## 2019-12-02 NOTE — Telephone Encounter (Signed)
I called Dr. Ardelle Anton to ask for orders for pt and Dr. Ardelle Anton states pt was calling him at the same time and he would call be back with orders.

## 2019-12-06 ENCOUNTER — Telehealth: Payer: Self-pay | Admitting: Podiatry

## 2019-12-06 ENCOUNTER — Telehealth: Payer: Self-pay | Admitting: *Deleted

## 2019-12-06 NOTE — Telephone Encounter (Signed)
I spoke with pt and he had been informed by Dr. Ardelle Anton of the necessity of the bone biopsy and I told him I would call again with appt information.

## 2019-12-06 NOTE — Telephone Encounter (Signed)
Can someone please get this patient in? I have called him on Friday to go over the MRI. He needs a bone biopsy and don't want to wait until I get back.   Dr. Lilian Kapur- can you see him this week? I am not sure if you have privileges yet and can do it this week? If scheduling does not work out please schedule him for bone biopsy/wound debridement with me.

## 2019-12-06 NOTE — Telephone Encounter (Signed)
Loel Ro - Surgery coordinator states Dr. Lilian Kapur has availability for surgery at [facility], pt needs to be evaluated tomorrow, so she will have information on pt and surgery to give to [facility]. I informed A. Katrinka Blazing - Tax inspector of the necessity to get pt in tomorrow afternoon with Dr. Lilian Kapur for surgery consultation.

## 2019-12-06 NOTE — Telephone Encounter (Signed)
I spoke with AKatrinka Blazing - Scheduling Coordinator and she states 1:30pm appt tomorrow with Dr. Lilian Kapur for surgery consultation.

## 2019-12-06 NOTE — Telephone Encounter (Signed)
I finally was able to reach the patient on Friday evening (July 2nd). We reviewed the MRI results and discussed his wound. He states that the tendon is exposed some more and he has followed up with the wound care center and he has another follow up next week. I recommended a bone biopsy. He is currently in [place] and then going to the beach and will not be back until Tuesday evening or Wednesday morning. Encouraged him not to get in the sand/water. Continue to elevate and local wound care. Will switch to Augmetin for now. I am very concerned that he has remained active and doing a lot of traveling which is inhibiting this wound from healing. We will get him scheduled for next week. I spoke with Val and she is going to help coordinate this and she also called in Augmetin and silvadene cream.

## 2019-12-06 NOTE — Telephone Encounter (Signed)
I asked Dr. Lilian Kapur his availability for surgery and he states he can evaluate pt tomorrow afternoon and perform surgery at the surgery center 12/09/2019. I informed S. Durant - Surgery Coordinator and she states she will check with B. Bonkemeyer - Print production planner for Dr. Vara Guardian status for scheduling at [facility].

## 2019-12-07 ENCOUNTER — Other Ambulatory Visit: Payer: Self-pay

## 2019-12-07 ENCOUNTER — Ambulatory Visit (INDEPENDENT_AMBULATORY_CARE_PROVIDER_SITE_OTHER): Payer: BLUE CROSS/BLUE SHIELD | Admitting: Podiatry

## 2019-12-07 ENCOUNTER — Encounter: Payer: Self-pay | Admitting: Podiatry

## 2019-12-07 VITALS — Temp 97.4°F

## 2019-12-07 DIAGNOSIS — M216X9 Other acquired deformities of unspecified foot: Secondary | ICD-10-CM

## 2019-12-07 DIAGNOSIS — M869 Osteomyelitis, unspecified: Secondary | ICD-10-CM

## 2019-12-07 DIAGNOSIS — E1149 Type 2 diabetes mellitus with other diabetic neurological complication: Secondary | ICD-10-CM | POA: Diagnosis not present

## 2019-12-07 DIAGNOSIS — G600 Hereditary motor and sensory neuropathy: Secondary | ICD-10-CM | POA: Diagnosis not present

## 2019-12-07 NOTE — Progress Notes (Signed)
Subjective:  Patient ID: [name], male    DOB: 09-02-1965,  MRN: [id number]  Chief Complaint  Patient presents with  . Follow-up    Requests surgical consult. No fever/chills/N&V/foul odor/pus/significant pain. No new complaints. Taking amoxicillin and using Silvadene. Wearing offloading shoe.    54 y.o. male presents with the above complaint. History confirmed with patient.  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  He has been seeing Dr. Ardelle Anton for this, he sent him for an MRI which he completed on 11/29/2019 he is here for review and further treatment options.  States he feels well and denies fevers chills nausea vomiting significant drainage or odor from the wound.  He is currently taking amoxicillin.  He is using Silvadene ointment on the wound daily.  Objective:  Physical Exam: warm, good capillary refill, normal DP and PT pulses, reduced sensation at distal toe pulps and plantar sole of foot and ulceration at submetatarsal 5 left foot.  Cavovarus foot type left foot Left Foot: Ulcer approximately 1 cm in diameter, no sinus tract.  There is a small amount of exposed capsule.  Granular base.  Hyperkeratosis surrounding 1  Study Result  Narrative & Impression  CLINICAL DATA:  Left foot osteomyelitis. History of wound debridement 03/27/2018.  EXAM: MRI OF THE LEFT FOREFOOT WITHOUT AND WITH CONTRAST  TECHNIQUE: Multiplanar, multisequence MR imaging of the left forefoot was performed both before and after administration of intravenous contrast.  CONTRAST:  96mL GADAVIST GADOBUTROL 1 MMOL/ML IV SOLN  COMPARISON:  Radiographs 11/08/2019 and 09/21/2019.  MRI 08/02/2019  FINDINGS: Bones/Joint/Cartilage  Chronic soft tissue ulceration and surrounding inflammation along the plantar aspect of the 5th metatarsophalangeal joint. There is increased soft tissue enhancement within the small toe. No focal fluid collection. There is a  small effusion of the 5th metatarsophalangeal joint with associated synovial enhancement. There are progressive signal changes in the 5th metatarsal head and 5th proximal phalanx with increased T2 signal and low level enhancement. No cortical destruction or definite abnormal T1 signal.  Chronic fragmentation of the 4th metatarsal head without abnormal signal or enhancement. The additional rays appear unchanged. There are mild degenerative changes at the 1st MTP joint.  Ligaments  Intact Lisfranc ligament.  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 No focal intramuscular fluid collection or significant tenosynovitis.  Soft tissues  As above, progressive soft tissue ulceration plantar to the 5th MTP joint and progressive enhancement of the soft tissues throughout the small toe. No focal fluid collection.  IMPRESSION: 1. Progressive soft tissue ulceration plantar to the 5th MTP joint with increased soft tissue enhancement throughout the small toe. No focal fluid collection to suggest abscess. 2. Progressive signal changes in the 5th metatarsal head and 5th proximal phalanx without cortical destruction or definite abnormal T1 signal. These findings are nonspecific and could be secondary to hyperemia although are suspicious for early osteomyelitis given adjacent soft tissue findings.   Electronically Signed   By: Carey Bullocks M.D.   On: 11/29/2019 16:24     Assessment:   1. Osteomyelitis of left foot, unspecified type (HCC)   2. Type II diabetes mellitus with neurological manifestations (HCC)   3. Charcot-Marie disease   4. Cavus deformity of foot, acquired      Plan:  Patient was evaluated and treated and all questions answered.  I reviewed the MRI with him in detail as above.  No treatment options for this  including continue local wound care with oral antibiotics, IV antibiotics via PICC line, bone  biopsy to confirm the pathologic presence of osteomyelitis as well as microbiological culture surgical resection of the fifth metatarsal head.  He would like to stabilize the foot as possible at this point.  He likes to undergo a biopsy of the metatarsal head.  We discussed this would help with risk, benefits, and complications, and an informed consent was reviewed and signed.  We had tenably scheduled this for the Friday, 12/09/2019, however after he parted from clinic today I had session with our surgery scheduler Alphonsa Gin delivery now that he does not have insurance coverage that will be covered at the surgical center or postcode hospital.  He has an insurance plan that is capitated to [facility].  I do know several of the podiatrist and that health system and I will send in a referral if he would like to see them.  He would like to return to see Dr. Ardelle Anton for 1 more visit to plan his further care.  Sharl Ma, DPM 12/07/2019

## 2019-12-08 ENCOUNTER — Encounter (HOSPITAL_BASED_OUTPATIENT_CLINIC_OR_DEPARTMENT_OTHER): Payer: BLUE CROSS/BLUE SHIELD | Attending: Internal Medicine | Admitting: Internal Medicine

## 2019-12-08 DIAGNOSIS — E1042 Type 1 diabetes mellitus with diabetic polyneuropathy: Secondary | ICD-10-CM | POA: Diagnosis not present

## 2019-12-08 DIAGNOSIS — M869 Osteomyelitis, unspecified: Secondary | ICD-10-CM | POA: Diagnosis not present

## 2019-12-08 DIAGNOSIS — I10 Essential (primary) hypertension: Secondary | ICD-10-CM | POA: Diagnosis not present

## 2019-12-08 DIAGNOSIS — E10621 Type 1 diabetes mellitus with foot ulcer: Secondary | ICD-10-CM | POA: Diagnosis present

## 2019-12-08 DIAGNOSIS — L97528 Non-pressure chronic ulcer of other part of left foot with other specified severity: Secondary | ICD-10-CM | POA: Insufficient documentation

## 2019-12-08 DIAGNOSIS — G6181 Chronic inflammatory demyelinating polyneuritis: Secondary | ICD-10-CM | POA: Insufficient documentation

## 2019-12-08 DIAGNOSIS — E1069 Type 1 diabetes mellitus with other specified complication: Secondary | ICD-10-CM | POA: Insufficient documentation

## 2019-12-08 DIAGNOSIS — Z794 Long term (current) use of insulin: Secondary | ICD-10-CM | POA: Insufficient documentation

## 2019-12-08 DIAGNOSIS — G600 Hereditary motor and sensory neuropathy: Secondary | ICD-10-CM | POA: Insufficient documentation

## 2019-12-09 ENCOUNTER — Other Ambulatory Visit: Payer: Self-pay | Admitting: Podiatry

## 2019-12-09 ENCOUNTER — Encounter: Payer: Self-pay | Admitting: Podiatry

## 2019-12-09 DIAGNOSIS — M86672 Other chronic osteomyelitis, left ankle and foot: Secondary | ICD-10-CM

## 2019-12-09 MED ORDER — HYDROCODONE-ACETAMINOPHEN 5-325 MG PO TABS: 1.0000 | ORAL_TABLET | Freq: Four times a day (QID) | ORAL | 0 refills | Status: AC | PRN

## 2019-12-09 NOTE — Progress Notes (Signed)
Post-op pain meds prescribed. 

## 2019-12-12 ENCOUNTER — Ambulatory Visit: Payer: BLUE CROSS/BLUE SHIELD | Admitting: Podiatry

## 2019-12-13 ENCOUNTER — Telehealth: Payer: Self-pay | Admitting: *Deleted

## 2019-12-13 NOTE — Progress Notes (Addendum)
NILSON, TABORA ([id number]) Visit Report for 12/08/2019 Arrival Information Details Patient Name: Date of Service: [facility], [name] 12/08/2019 8:00 A M Medical Record Number: [id number] Patient Account Number: [id number] Date of Birth/Sex: Treating RN: [birth year] (54 y.o. [name]) [name] Primary Care Regenia Erck: Ananias Pilgrim Other Clinician: Referring Bayyinah Dukeman: Treating Romolo Sieling/Extender: [name], Diona Fanti in Treatment: 2 Visit Information History Since Last Visit All ordered tests and consults were completed: No Patient Arrived: Ambulatory Added or deleted any medications: No Arrival Time: 08:28 Any new allergies or adverse reactions: No Accompanied By: self Had a fall or experienced change in No Transfer Assistance: None activities of daily living that may affect Patient Identification Verified: Yes risk of falls: Secondary Verification Process Completed: Yes Signs or symptoms of abuse/neglect since last visito No Patient Requires Transmission-Based Precautions: No Hospitalized since last visit: No Patient Has Alerts: No Implantable device outside of the clinic excluding No cellular tissue based products placed in the center since last visit: Has Dressing in Place as Prescribed: Yes Pain Present Now: No Electronic Signature(s) Signed: 12/12/2019 5:13:16 PM By: [name] RN Entered By: [name] on 12/08/2019 08:28:39 -------------------------------------------------------------------------------- Clinic Level of Care Assessment Details Patient Name: Date of Service: [facility], [name] 12/08/2019 8:00 A M Medical Record Number: [id number] Patient Account Number: [id number] Date of Birth/Sex: Treating RN: 1965-09-27 (54 y.o. [name] Primary Care Aliece Honold: Ananias Pilgrim Other Clinician: Referring Bethania Schlotzhauer: Treating Juniper Cobey/Extender: Alta Corning in Treatment: 2 Clinic Level of Care Assessment Items TOOL  4 Quantity Score X- 1 0 Use when only an EandM is performed on FOLLOW-UP visit ASSESSMENTS - Nursing Assessment / Reassessment X- 1 10 Reassessment of Co-morbidities (includes updates in patient status) X- 1 5 Reassessment of Adherence to Treatment Plan ASSESSMENTS - Wound and Skin A ssessment / Reassessment X - Simple Wound Assessment / Reassessment - one wound 1 5 []  - 0 Complex Wound Assessment / Reassessment - multiple wounds []  - 0 Dermatologic / Skin Assessment (not related to wound area) ASSESSMENTS - Focused Assessment []  - 0 Circumferential Edema Measurements - multi extremities []  - 0 Nutritional Assessment / Counseling / Intervention X- 1 5 Lower Extremity Assessment (monofilament, tuning fork, pulses) []  - 0 Peripheral Arterial Disease Assessment (using hand held doppler) ASSESSMENTS - Ostomy and/or Continence Assessment and Care []  - 0 Incontinence Assessment and Management []  - 0 Ostomy Care Assessment and Management (repouching, etc.) PROCESS - Coordination of Care X - Simple Patient / Family Education for ongoing care 1 15 []  - 0 Complex (extensive) Patient / Family Education for ongoing care X- 1 10 Staff obtains , Records, T Results / Process Orders est []  - 0 Staff telephones HHA, Nursing Homes / Clarify orders / etc []  - 0 Routine Transfer to another Facility (non-emergent condition) []  - 0 Routine Hospital Admission (non-emergent condition) []  - 0 New Admissions / / Ordering NPWT Apligraf, etc. , []  - 0 Emergency Hospital Admission (emergent condition) X- 1 10 Simple Discharge Coordination []  - 0 Complex (extensive) Discharge Coordination PROCESS - Special Needs []  - 0 Pediatric / Minor Patient Management []  - 0 Isolation Patient Management []  - 0 Hearing / Language / Visual special needs []  - 0 Assessment of Community assistance (transportation, D/C planning, etc.) []  - 0 Additional assistance / Altered  mentation []  - 0 Support Surface(s) Assessment (bed, cushion, seat, etc.) INTERVENTIONS - Wound Cleansing / Measurement X - Simple Wound Cleansing - one wound  1 5 []  - 0 Complex Wound Cleansing - multiple wounds X- 1 5 Wound Imaging (photographs - any number of wounds) []  - 0 Wound Tracing (instead of photographs) X- 1 5 Simple Wound Measurement - one wound []  - 0 Complex Wound Measurement - multiple wounds INTERVENTIONS - Wound Dressings X - Small Wound Dressing one or multiple wounds 1 10 []  - 0 Medium Wound Dressing one or multiple wounds []  - 0 Large Wound Dressing one or multiple wounds X- 1 5 Application of Medications - topical []  - 0 Application of Medications - injection INTERVENTIONS - Miscellaneous []  - 0 External ear exam []  - 0 Specimen Collection (cultures, biopsies, blood, body fluids, etc.) []  - 0 Specimen(s) / Culture(s) sent or taken to Lab for analysis []  - 0 Patient Transfer (multiple staff / Nurse, adultHoyer Lift / Similar devices) []  - 0 Simple Staple / Suture removal (25 or less) []  - 0 Complex Staple / Suture removal (26 or more) []  - 0 Hypo / Hyperglycemic Management (close monitor of Blood Glucose) []  - 0 Ankle / Brachial Index (ABI) - do not check if billed separately X- 1 5 Vital Signs Has the patient been seen at the hospital within the last three years: Yes Total Score: 95 Level Of Care: New/Established - Level 3 Electronic Signature(s) Signed: 12/12/2019 5:04:17 PM By: Zandra AbtsLynch, Shatara RN, BSN Entered By: Zandra AbtsLynch, Shatara on 12/08/2019 10:46:24 -------------------------------------------------------------------------------- Encounter Discharge Information Details [facility]: [facility] [name] 12/08/2019 8:00 A M Medical Record Number: [id number] Patient Account Number: [id number] Date of Birth/Sex: Treating RN: 1965-12-04 (54 y.o. Erik PetitM) Yevonne PaxEpps, Carrie Primary Care Barack Nicodemus: Ananias PilgrimAsres, Alehegn Other Clinician: Referring  Ausar Georgiou: Treating Lashe Oliveira/Extender: Lianne Cureobson, Michael Asres, [name] in Treatment: 2 Encounter Discharge Information Items Discharge Condition: Stable Ambulatory Status: Ambulatory Discharge Destination: Home Transportation: Private Auto Accompanied By: self Schedule Follow-up Appointment: Yes Clinical Summary of Care: Patient Declined Electronic Signature(s) Signed: 12/12/2019 5:13:16 PM By: Yevonne PaxEpps, Carrie RN Entered By: Yevonne PaxEpps, Carrie on 12/08/2019 09:19:57 -------------------------------------------------------------------------------- Lower Extremity Assessment Details [facility]: [facility] [name] 12/08/2019 8:00 A M Medical Record Number: [id number] Patient Account Number: [id number] Date of Birth/Sex: Treating RN: 1965-12-04 (54 y.o. Erik PetitM) Yevonne PaxEpps, Carrie Primary Care Grae Leathers: Ananias PilgrimAsres, Alehegn Other Clinician: Referring Ramani Riva: Treating Shantae Vantol/Extender: Lianne Cureobson, Michael Asres, [name] in Treatment: 2 Edema Assessment Assessed: [Left: No] [Right: No] Edema: [Left: N] [Right: o] Calf Left: Right: Point of Measurement: cm From Medial Instep 36 cm cm Ankle Left: Right: Point of Measurement: cm From Medial Instep 21 cm cm Electronic Signature(s) Signed: 12/12/2019 5:13:16 PM By: Yevonne PaxEpps, Carrie RN Entered By: Yevonne PaxEpps, Carrie on 12/08/2019 08:35:02 -------------------------------------------------------------------------------- Multi Wound Chart Details [facility]: [facility] [name] 12/08/2019 8:00 A M Medical Record Number: [id number] Patient Account Number: [id number] Date of Birth/Sex: Treating RN: 1965-12-04 (54 y.o. Elizebeth KollerM) Lynch, Shatara Primary Care Claritza July: Ananias PilgrimAsres, Alehegn Other Clinician: Referring Alexiz Cothran: Treating Dashia Caldeira/Extender: Lianne Cureobson, Michael Asres, [name] in Treatment: 2 Vital Signs Height(in): 76 Pulse(bpm): 70 Weight(lbs): 255 Blood Pressure(mmHg): 141/79 Body Mass Index(BMI): 31 Temperature(F):  98.5 Respiratory Rate(breaths/min): 18 Photos: [3:No Photos Left Metatarsal head fifth] [N/A:N/A N/A] Wound Location: [3:Gradually Appeared] [N/A:N/A] Wounding Event: [3:Diabetic Wound/Ulcer of the Lower] [N/A:N/A] Primary Etiology: [3:Extremity Cataracts, Type I Diabetes,] [N/A:N/A] Comorbid History: [3:Osteomyelitis, Neuropathy 08/01/2019] [N/A:N/A] Date Acquired: [3:2] [N/A:N/A] Weeks of Treatment: [3:Open] [N/A:N/A] Wound Status: [3:2x2.7x0.4] [N/A:N/A] Measurements L x W x D (cm) [3:4.241] [N/A:N/A] A (cm) : rea [3:1.696] [N/A:N/A] Volume (  cm) : [3:-57.90%] [N/A:N/A] % Reduction in A rea: [3:-57.90%] [N/A:N/A] % Reduction in Volume: [3:Grade 2] [N/A:N/A] Classification: [3:Medium] [N/A:N/A] Exudate A mount: [3:Serosanguineous] [N/A:N/A] Exudate Type: [3:red, brown] [N/A:N/A] Exudate Color: [3:Flat and Intact] [N/A:N/A] Wound Margin: [3:Large (67-100%)] [N/A:N/A] Granulation A mount: [3:Red] [N/A:N/A] Granulation Quality: [3:Small (1-33%)] [N/A:N/A] Necrotic A mount: [3:Fat Layer (Subcutaneous Tissue)] [N/A:N/A] Exposed Structures: [3:Exposed: Yes Tendon: Yes Fascia: No Muscle: No Joint: No Bone: No None] [N/A:N/A] Treatment Notes Electronic Signature(s) Signed: 12/08/2019 5:28:43 PM By: Baltazar Najjar MD Signed: 12/12/2019 5:04:17 PM By: Zandra Abts RN, BSN Entered By: Baltazar Najjar on 12/08/2019 09:15:47 -------------------------------------------------------------------------------- Multi-Disciplinary Care Plan Details Patient Name: Date of Service: [facility], [name] 12/08/2019 8:00 A M Medical Record Number: [id number] Patient Account Number: [id number] Date of Birth/Sex: Treating RN: [birth year] (54 y.o. [name] Primary Care Jadene Stemmer: Ananias Pilgrim Other Clinician: Referring Iysha Mishkin: Treating Marlea Gambill/Extender: [name], Diona Fanti in Treatment: 2 Active Inactive Electronic Signature(s) Signed: 12/12/2019 5:04:17 PM By: Zandra Abts RN, BSN Entered By: Zandra Abts on 12/08/2019 10:09:43 -------------------------------------------------------------------------------- Pain Assessment Details Patient Name: Date of Service: [facility], [name] 12/08/2019 8:00 A M Medical Record Number: [id number] Patient Account Number: [id number] Date of Birth/Sex: Treating RN: November 21, 1965 (53 y.o. [name]) [name] Primary Care Makeisha Jentsch: Ananias Pilgrim Other Clinician: Referring Aneka Fagerstrom: Treating Amarianna Abplanalp/Extender: [name], [name] in Treatment: 2 Active Problems Location of Pain Severity and Description of Pain Patient Has Paino No Site Locations Pain Management and Medication Current Pain Management: Electronic Signature(s) Signed: 12/12/2019 5:13:16 PM By: [name] RN Entered By: [name] on 12/08/2019 08:29:08 -------------------------------------------------------------------------------- Patient/Caregiver Education Details Patient Name: Date of Service: [facility], [place] Jilda Panda 7/8/2021andnbsp8:00 A M Medical Record Number: [id number] Patient Account Number: [id number] Date of Birth/Gender: Treating RN: 1965-08-25 (54 y.o. [name] Primary Care Physician: Ananias Pilgrim Other Clinician: Referring Physician: Treating Physician/Extender: Alta Corning in Treatment: 2 Education Assessment Education Provided To: Patient Education Topics Provided Wound/Skin Impairment: Methods: Explain/Verbal Responses: State content correctly Electronic Signature(s) Signed: 12/12/2019 5:04:17 PM By: Zandra Abts RN, BSN Entered By: Zandra Abts on 12/08/2019 08:58:27 -------------------------------------------------------------------------------- Wound Assessment Details Patient Name: Date of Service: [facility], [name] 12/08/2019 8:00 A M Medical Record Number: [id number] Patient Account Number: [id number] Date of Birth/Sex: Treating RN: Oct 17, 1965 (53  y.o. [name]) [name] Primary Care Raford Brissett: Ananias Pilgrim Other Clinician: Referring Ramy Greth: Treating Genetta Fiero/Extender: [name], [name] in Treatment: 2 Wound Status Wound Number: 3 Primary Etiology: Diabetic Wound/Ulcer of the Lower Extremity Wound Location: Left Metatarsal head fifth Wound Status: Open Wounding Event: Gradually Appeared Comorbid History: Cataracts, Type I Diabetes, Osteomyelitis, Neuropathy Date Acquired: 08/01/2019 Weeks Of Treatment: 2 Clustered Wound: No Photos Photo Uploaded By: Benjaman Kindler on 12/08/2019 13:35:29 Wound Measurements Length: (cm) 2 Width: (cm) 2.7 Depth: (cm) 0.4 Area: (cm) 4.241 Volume: (cm) 1.696 % Reduction in Area: -57.9% % Reduction in Volume: -57.9% Epithelialization: None Tunneling: No Undermining: No Wound Description Classification: Grade 2 Wound Margin: Flat and Intact Exudate Amount: Medium Exudate Type: Serosanguineous Exudate Color: red, brown Foul Odor After Cleansing: No Slough/Fibrino No Wound Bed Granulation Amount: Large (67-100%) Exposed Structure Granulation Quality: Red Fascia Exposed: No Necrotic Amount: Small (1-33%) Fat Layer (Subcutaneous Tissue) Exposed: Yes Necrotic Quality: Adherent Slough Tendon Exposed: Yes Muscle Exposed: No Joint Exposed: No Bone Exposed: No Electronic Signature(s) Signed: 12/12/2019 5:13:16 PM By: [name] RN Entered By: [name] on 12/08/2019 08:36:00 -------------------------------------------------------------------------------- Vitals Details Patient Name:  Date of Service: [facility], [name] 12/08/2019 8:00 A M Medical Record Number: [id number] Patient Account Number: [id number] Date of Birth/Sex: Treating RN: 1966/05/13 (53 y.o. [name]) [name] Primary Care Markice Torbert: Ananias Pilgrim Other Clinician: Referring Deanette Tullius: Treating Angas Isabell/Extender: [name], [name] in Treatment: 2 Vital Signs Time Taken:  08:28 Temperature (F): 98.5 Height (in): 76 Pulse (bpm): 70 Weight (lbs): 255 Respiratory Rate (breaths/min): 18 Body Mass Index (BMI): 31 Blood Pressure (mmHg): 141/79 Reference Range: 80 - 120 mg / dl Electronic Signature(s) Signed: 12/12/2019 5:13:16 PM By: [name] RN Entered By: [name] on 12/08/2019 08:29:02

## 2019-12-13 NOTE — Progress Notes (Signed)
STACI, CARVER ([id number]) Visit Report for 12/08/2019 HPI Details Patient Name: Date of Service: [facility], [place] 01/06/5783 8:00 A M Medical Record Number: [id number] Patient Account Number: [id number] Date of Birth/Sex: Treating RN: 1966-05-07 (54 y.o. [name] Primary Care Provider: [name] Other Clinician: Referring Provider: Treating Provider/Extender: [name], [name] in Treatment: 2 History of Present Illness HPI Description: ADMISSION 05/04/2018 This is a 54 year old man who apparently is a type I diabetic confirmed by appropriate serology testing 6 years ago. He is therefore on insulin. His most recent hemoglobin A1c was 10.7 on 03/27/2018. 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 As a result of a combination of this he is insensate and even has reduced sensation in his upper extremities. His current problem started in early July he noted a blister over the left fourth plantar metatarsal head. He saw [name] his podiatrist on 01/07/2018. At that time he had a wound that measured 2 x 1 x 0.4 cm. An x-ray did not show osteomyelitis. It did not appear that he actually followed up. He was admitted to hospital on 03/25/2018 through 03/30/2018 with his left foot painful and swollen. He underwent a surgical IandD by [name]. I believe his culture at that time showed Enterococcus faecalis and a bone culture showed enterococcus avium. He is been reviewed by [name] of infectious disease although I have not reviewed his note and he has been on 6 weeks worth of ertapenem which should finish sometime apparently on December 9.. MRI suggested possibility of septic arthritis but did not comment specifically on osteomyelitis. A more recent wound culture was negative. The patient has been  referred here for consideration among other things of hyperbaric oxygen versus proceeding with a partial ray amputation. This would involve excision of the fourth metatarsal head. The patient is offloading this and a Darco forefoot off loader. I think he is using iodoform packing. 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 Certainly would be a possibility of a total contact cast. I did not see any arterial studies on him however his peripheral pulses are palpable 05/11/2018; patient started his hyperbarics on Friday but did not dive yesterday for personal issues. He saw [name] on 12//19 he notes that during the hospitalization in October 2019 he had an abscess and underwent a bone biopsy with culture that showed group B strep, enterococcus avium and Enterococcus faecalis. There was Prevotella from the abscess. He was put on ertapenem for 6 weeks which he completed. Per [name] notable for the fact that he is not been treated with ampicillin or amoxicillin. His hemoglobin A1c was noted to be high at 10.7. He is started on ampicillin sulbactam every 6 again I think this is for better coverage for the enterococcus. We started him on silver alginate as there is still purulent looking drainage coming out of the wound and there is indeed exposed bone. The patient has not been unwell 05/18/2018; patient continues with hyperbarics he is tolerating this well and has no complaints. He is on Unasyn every 6. We have been using silver alginate to the area over the fourth plantar metatarsal head. He is a poorly controlled diabetic. There is certainly less purulent drainage than when I first saw this man at the beginning of the month. He only sedimentation rate I see on  him was 40 on 03/26/2018. States his blood sugars are under better control in the 120s to 130 range. Follows with his endocrinologist in [facility] I change his primary dressing to Prisma today attempting to increase the  granulation. After I left the room he called [place] back into look at a area that he noticed on the right foot 2 days ago. Very superficial and in the medial aspect of his high plantar arch [secondary to his neuropathy] he has a custom insert in the shoe on the right foot. 05/24/2018; continues with HBO doing well. He saw infectious disease today and is still on Unasyn every 6 hours. Not making a lot of progress on the left foot with Prisma I changed him to silver alginate strips. The new area on the right midfoot he is putting silver alginate on this as well. 06/01/2018; patient remains on Unasyn. Not making a lot of progress with granulation. I am going to put him in a total contact cast use Prisma for now. He has a high co-pay for an advanced treatment option but I suspect feeling in this wound may come to that 06/04/2018; patient back for the obligatory first total contact cast change. We continue to use Prisma 06/08/2018; the patient is tolerating the cast well although he had some loosening over the tibia which rubbed when he walked. There is no lesion here. We have been using Prisma under the total contact cast. He is in hyperbarics which she is tolerating well 06/15/2018; tolerating total contact cast and hyperbarics well. He continues IV antibiotics and for the underlying osteomyelitis as directed by infectious disease. His wound is making really nice progress 1/21; Patient's wound is down in depth. Using silver collagen. Ab's end this week and Pic line is to be d/ced 1/28; 0.8 cm in depth which is more than last time. We have been using silver collagen under total contact cast. He continues in hyperbarics. His IV antibiotics are finished PICC line has been removed 2/4; surprisingly today the area is actually epithelialized. There is no open area. There is however a divot where the epithelium is gone down to close the wound surface. I am not completely convinced that this is going to remain viable  over time but I think time is the best thing to determine this. There is no evidence of surrounding infection. As of today I do not think a repeat MRI or any other procedure is going to be indicative of whether this is going to maintain a closed state. I am going to continue him in a total contact cast. He will complete HBO. Next week I will allow him perhaps to graduate into a Darco forefoot off loader, then a healing sandal. I will then send him back to podiatry to see if they can fashion an offloading shoe if this area remains closed 2/10; the patient was taken out of the cast today the area is healed. He has a divot however this is fully epithelialized and the epithelialized tissue looks healthy. I have put him into a Darco forefoot off loader. He will complete his last 2 hyperbaric treatments. I will see him next week and I may transition him into a surgical shoe versus his own shoe. He is following up with [name] of podiatry and I think a diabetic shoe with custom inserts could be provided with the signature provided by his endocrinologist ADMISSION 6/22 CLINICAL DATA: Foot ulcer, question of osteomyelitis EXAM: MRI OF THE LEFT FOOT WITHOUT CONTRAST TECHNIQUE:  Multiplanar, multisequence MR imaging of the left was performed. No intravenous contrast was administered. COMPARISON: Radiograph same day FINDINGS: Bones/Joint/Cartilage Small cystic changes are seen at the fifth metatarsal head, likely degenerative changes. No marrow edema, osseous fracture, or periosteal reaction are seen throughout the osseous structures. Small joint effusion seen at the fifth MTP joint. There appears to be chronic cortical irregularity with erosive type change seen at the fourth metatarsal head which is unchanged since June 28, 2019, however new since 2019. Ligaments The Lisfranc ligaments and collateral ligaments appear to be intact. Muscles and Tendons Diffusely increased signal seen throughout  the muscles surrounding the forefoot with fatty atrophy. The flexor and extensor tendons appear to be intact. The plantar fascia is intact. Soft tissues Along the dorsal lateral aspect of the fifth metatarsal shaft there is a focal area superficial ulceration measuring approximately 8 mm in transverse dimension. There is overlying skin thickening and subcutaneous edema seen. No sinus tract or loculated fluid collection is noted. IMPRESSION: 1. Small focal area of ulceration along the dorsal lateral aspect of the fifth metatarsal shaft. No definite evidence of acute osteomyelitis, sinus tract, or abscess. 2. Progressive chronic erosive cortical destruction seen at the fourth metatarsal head since 2019, however unchanged since June 28, 2019, which could be from chronic osteomyelitis or prior injury. 3. Small joint effusion at the fifth MTP joint. Electronically Signed By: Prudencio Pair M.D. On: 08/02/2019 1:19 54 year old type I diabetic who we had in clinic in late 2019 to February 2020 with osteomyelitis of his left fourth metatarsal head and a wound in this area. This eventually healed with IV antibiotics and hyperbaric oxygen. He was relatively well until February of this year when he developed I believe 2 wounds on the left fifth met head 1 plantar and 1 more laterally. The one more laterally has since closed over but he has been left with a fairly persistent wound in the plantar left fifth met head. Has been following with [name] of podiatry. For an extended period of time he used Medihoney. He received doxycycline and April at which time a culture showed E. coli and MSSA. An MRI done on August 02, 2019 [shown above] did not show osteomyelitis but did suggest some ongoing destruction at the fourth metatarsal head. He was switched to silver alginate as the primary dressing in May. Earlier this month he received a 2-week course of Bactrim due to erythema around the wound. He was  referred to Dr. Alfred Levins of infectious disease who did not think anything was infected and recommended stopping antibiotics. The patient said he felt really well has you finished up the Bactrim but then yesterday morning he became febrile again and noted more erythema around the wound. He was referred here for our review of this. His temperature today is 99.3. He denies any other infectious symptoms no cough no upper respiratory, no dysuria etc. He has been using silver alginate and offloading this area with a surgical shoe. 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 He apparently had shoulder surgery in the fall 2020 I did not look this up he apparently has hardware in this area. Recent x-ray done in Dr. Pasty Arch office on 6/7 was negative for osteo- ABIs have always been noncompressible. Is not previously been thought that arterial insufficiency is contributing to this. 6/29; patient returns the clinic. I thought he might be going  to see Dr. Earleen Newport ongoing for this wound but he is back today. He did see Dr. Earleen Newport on 11/22/2019 I believe he agreed with the MRI and apparently was making some arrangements to have this done in [facility] which I am fine with if they can compare with the study of March 2. In any case he returns with a clinic of the wound is not as good. He has been using silver alginate. He is still on another antibiotic that he says Dr. Earleen Newport prescribed 7/8; the patient had his MRI which showed a progressive soft tissue ulceration plantar to the fifth MTP joint with increased soft tissue enhancement throughout the small toe no abscess was noted. There was progressive signal changes in the fifth metatarsal head and fifth proximal phalanx without cortical destruction or definite abnormal T1 signal. These findings are nonspecific could be secondary  to hyperemia or early osteomyelitis. He is on amoxicillin as directed by Dr. Earleen Newport. There were plans to do a bone biopsy and culture done in the OR on Friday however the patient canceled this because of insurance issues [surgical center not in [place]. In any case I have told him that this procedure in my opinion needs to be done. He is using Silvadene cream Electronic Signature(s) Signed: 12/08/2019 5:28:43 PM By: [name] MD Entered By: [name] on 12/08/2019 09:19:30 -------------------------------------------------------------------------------- Physical Exam Details Patient Name: Date of Service: [facility], [place] 11/08/319 8:00 A M Medical Record Number: [id number] Patient Account Number: [id number] Date of Birth/Sex: Treating RN: 1965-08-14 (54 y.o. [name] Primary Care Provider: [name] Other Clinician: Referring Provider: Treating Provider/Extender: [name], [name] in Treatment: 2 Constitutional Patient is hypertensive.. Pulse regular and within target range for patient.Marland Kitchen Respirations regular, non-labored and within target range.. Temperature is normal and within the target range for the patient.Marland Kitchen Appears in no distress. Cardiovascular Pedal pulses are palpable. Musculoskeletal No obvious effusion in the fifth MTP joint.. Notes Wound exam; open wound on the plantar fifth metatarsal head. This is a full-thickness wound that goes through the skin and into the muscle layer of the foot. There was some exposed tendon last time it is not visible this time however I think it simply denatured. This is right against the bone. There is no gross purulence present. The entire wound area extending into the dorsal part of the fifth MTP however is warm to the touch. The erythema that I marked last time he is here is receded however. Electronic Signature(s) Signed: 12/08/2019 5:28:43 PM By: [name] MD Entered By: [name] on  12/08/2019 09:20:55 -------------------------------------------------------------------------------- Physician Orders Details Patient Name: Date of Service: [facility], [place] 7/0/4888 8:00 A M Medical Record Number: [id number] Patient Account Number: [id number] Date of Birth/Sex: Treating RN: 1966/01/03 (54 y.o. [name] Primary Care Provider: [name] Other Clinician: Referring Provider: Treating Provider/Extender: [name], [name] in Treatment: 2 Verbal / Phone Orders: No Diagnosis Coding ICD-10 Coding Code Description E10.621 Type 1 diabetes mellitus with foot ulcer L97.528 Non-pressure chronic ulcer of other part of left foot with other specified severity E10.42 Type 1 diabetes mellitus with diabetic polyneuropathy G62.89 Other specified polyneuropathies Follow-up Appointments Other: - Continue to follow with [name] - call back if needed to schedule follow up appt Discharge From [facility] Discharge from [place] Frequency Wound #3 Left Metatarsal head fifth Change dressing every day. Wound Cleansing Wound #3 Left Metatarsal head fifth May shower  and wash wound with soap and water. Primary Wound Dressing Wound #3 Left Metatarsal head fifth Calcium Alginate with Silver Secondary Dressing Wound #3 Left Metatarsal head fifth Foam - foam donut Kerlix/Rolled Gauze Dry Gauze Off-Loading Wedge shoe to: - front foot Dietitian) Signed: 12/08/2019 5:28:43 PM By: [name] MD Signed: 12/12/2019 5:04:17 PM By: [name] RN, BSN Entered By: [name] on 12/08/2019 09:01:47 -------------------------------------------------------------------------------- Problem List Details Patient Name: Date of Service: [facility], [place] 01/03/3824 8:00 A M Medical Record Number: [id number] Patient Account Number: [id number] Date of Birth/Sex: Treating RN: 1965/06/09 (54 y.o. [name] Primary Care Provider: [name] Other Clinician: Referring Provider: Treating Provider/Extender: [name], [name] in Treatment: 2 Active Problems ICD-10 Encounter Code Description Active Date MDM Diagnosis E10.621 Type 1 diabetes mellitus with foot ulcer 11/22/2019 No Yes L97.528 Non-pressure chronic ulcer of other part of left foot with other specified 11/22/2019 No Yes severity E10.42 Type 1 diabetes mellitus with diabetic polyneuropathy 11/22/2019 No Yes G62.89 Other specified polyneuropathies 11/22/2019 No Yes Inactive Problems Resolved Problems Electronic Signature(s) Signed: 12/08/2019 5:28:43 PM By: [name] MD Entered By: [name] on 12/08/2019 09:15:15 -------------------------------------------------------------------------------- Progress Note Details Patient Name: Date of Service: [facility], [place] 06/10/3788 8:00 A M Medical Record Number: [id number] Patient Account Number: [id number] Date of Birth/Sex: Treating RN: 1965-09-23 (54 y.o. [name] Primary Care Provider: [name] Other Clinician: Referring Provider: Treating Provider/Extender: [name], [name] in Treatment: 2 Subjective History of Present Illness (HPI) ADMISSION 05/04/2018 This is a 54 year old man who apparently is a type I diabetic confirmed by appropriate serology testing 6 years ago. He is therefore on insulin. His most recent hemoglobin A1c was 10.7 on 03/27/2018. 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 As a result of a combination of this he is insensate and even has reduced sensation in his upper extremities. His current problem started in early July he noted a blister over the left fourth plantar metatarsal head. He saw [name] his podiatrist on  01/07/2018. At that time he had a wound that measured 2 x 1 x 0.4 cm. An x-ray did not show osteomyelitis. It did not appear that he actually followed up. He was admitted to hospital on 03/25/2018 through 03/30/2018 with his left foot painful and swollen. He underwent a surgical IandD by [name]. I believe his culture at that time showed Enterococcus faecalis and a bone culture showed enterococcus avium. He is been reviewed by [name] of infectious disease although I have not reviewed his note and he has been on 6 weeks worth of ertapenem which should finish sometime apparently on December 9.. MRI suggested possibility of septic arthritis but did not comment specifically on osteomyelitis. A more recent wound culture was negative. The patient has been referred here for consideration among other things of hyperbaric oxygen versus proceeding with a partial ray amputation. This would involve excision of the fourth metatarsal head. The patient is offloading this and a Darco forefoot off loader. I think he is using iodoform packing. 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 Certainly would be a possibility of a total contact cast. I did not see any arterial studies on him however his peripheral pulses are palpable 05/11/2018; patient started his hyperbarics on Friday but did not dive yesterday for  personal issues. He saw [name] on 12//19 he notes that during the hospitalization in October 2019 he had an abscess and underwent a bone biopsy with culture that showed group B strep, enterococcus avium and Enterococcus faecalis. There was Prevotella from the abscess. He was put on ertapenem for 6 weeks which he completed. Per [name] notable for the fact that he is not been treated with ampicillin or amoxicillin. His hemoglobin A1c was noted to be high at 10.7. He is started on ampicillin sulbactam every 6 again I think this is for better coverage for the enterococcus. We  started him on silver alginate as there is still purulent looking drainage coming out of the wound and there is indeed exposed bone. The patient has not been unwell 05/18/2018; patient continues with hyperbarics he is tolerating this well and has no complaints. He is on Unasyn every 6. We have been using silver alginate to the area over the fourth plantar metatarsal head. He is a poorly controlled diabetic. There is certainly less purulent drainage than when I first saw this man at the beginning of the month. He only sedimentation rate I see on him was 40 on 03/26/2018. States his blood sugars are under better control in the 120s to 130 range. Follows with his endocrinologist in [facility] I change his primary dressing to Prisma today attempting to increase the granulation. After I left the room he called [place] back into look at a area that he noticed on the right foot 2 days ago. Very superficial and in the medial aspect of his high plantar arch [secondary to his neuropathy] he has a custom insert in the shoe on the right foot. 05/24/2018; continues with HBO doing well. He saw infectious disease today and is still on Unasyn every 6 hours. Not making a lot of progress on the left foot with Prisma I changed him to silver alginate strips. The new area on the right midfoot he is putting silver alginate on this as well. 06/01/2018; patient remains on Unasyn. Not making a lot of progress with granulation. I am going to put him in a total contact cast use Prisma for now. He has a high co-pay for an advanced treatment option but I suspect feeling in this wound may come to that 06/04/2018; patient back for the obligatory first total contact cast change. We continue to use Prisma 06/08/2018; the patient is tolerating the cast well although he had some loosening over the tibia which rubbed when he walked. There is no lesion here. We have been using Prisma under the total contact cast. He is in hyperbarics which she is  tolerating well 06/15/2018; tolerating total contact cast and hyperbarics well. He continues IV antibiotics and for the underlying osteomyelitis as directed by infectious disease. His wound is making really nice progress 1/21; Patient's wound is down in depth. Using silver collagen. Ab's end this week and Pic line is to be d/ced 1/28; 0.8 cm in depth which is more than last time. We have been using silver collagen under total contact cast. He continues in hyperbarics. His IV antibiotics are finished PICC line has been removed 2/4; surprisingly today the area is actually epithelialized. There is no open area. There is however a divot where the epithelium is gone down to close the wound surface. I am not completely convinced that this is going to remain viable over time but I think time is the best thing to determine this. There is no evidence of surrounding  infection. As of today I do not think a repeat MRI or any other procedure is going to be indicative of whether this is going to maintain a closed state. I am going to continue him in a total contact cast. He will complete HBO. Next week I will allow him perhaps to graduate into a Darco forefoot off loader, then a healing sandal. I will then send him back to podiatry to see if they can fashion an offloading shoe if this area remains closed 2/10; the patient was taken out of the cast today the area is healed. He has a divot however this is fully epithelialized and the epithelialized tissue looks healthy. I have put him into a Darco forefoot off loader. He will complete his last 2 hyperbaric treatments. I will see him next week and I may transition him into a surgical shoe versus his own shoe. He is following up with [name] of podiatry and I think a diabetic shoe with custom inserts could be provided with the signature provided by his endocrinologist ADMISSION 6/22 CLINICAL DATA: Foot ulcer, question of osteomyelitis EXAM: MRI OF THE LEFT FOOT  WITHOUT CONTRAST TECHNIQUE: Multiplanar, multisequence MR imaging of the left was performed. No intravenous contrast was administered. COMPARISON: Radiograph same day FINDINGS: Bones/Joint/Cartilage Small cystic changes are seen at the fifth metatarsal head, likely degenerative changes. No marrow edema, osseous fracture, or periosteal reaction are seen throughout the osseous structures. Small joint effusion seen at the fifth MTP joint. There appears to be chronic cortical irregularity with erosive type change seen at the fourth metatarsal head which is unchanged since June 28, 2019, however new since 2019. Ligaments The Lisfranc ligaments and collateral ligaments appear to be intact. Muscles and Tendons Diffusely increased signal seen throughout the muscles surrounding the forefoot with fatty atrophy. The flexor and extensor tendons appear to be intact. The plantar fascia is intact. Soft tissues Along the dorsal lateral aspect of the fifth metatarsal shaft there is a focal area superficial ulceration measuring approximately 8 mm in transverse dimension. There is overlying skin thickening and subcutaneous edema seen. No sinus tract or loculated fluid collection is noted. IMPRESSION: 1. Small focal area of ulceration along the dorsal lateral aspect of the fifth metatarsal shaft. No definite evidence of acute osteomyelitis, sinus tract, or abscess. 2. Progressive chronic erosive cortical destruction seen at the fourth metatarsal head since 2019, however unchanged since June 28, 2019, which could be from chronic osteomyelitis or prior injury. 3. Small joint effusion at the fifth MTP joint. Electronically Signed By: Prudencio Pair M.D. 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 This eventually healed with IV antibiotics and hyperbaric oxygen. He  was relatively well until February of this year when he developed I believe 2 wounds on the left fifth met head 1 plantar and 1 more laterally. The one more laterally has since closed over but he has been left with a fairly persistent wound in the plantar left fifth met head. Has been following with [name] of podiatry. For an extended period of time he used Medihoney. He received doxycycline and April at which time a culture showed E. coli and MSSA. An MRI done on August 02, 2019 [shown above] did not show osteomyelitis but did suggest some ongoing destruction at the fourth metatarsal head. He was switched to silver alginate as the primary dressing  in May. Earlier this month he received a 2-week course of Bactrim due to erythema around the wound. He was referred to Dr. Alfred Levins of infectious disease who did not think anything was infected and recommended stopping antibiotics. The patient said he felt really well has you finished up the Bactrim but then yesterday morning he became febrile again and noted more erythema around the wound. He was referred here for our review of this. His temperature today is 99.3. He denies any other infectious symptoms no cough no upper respiratory, no dysuria etc. He has been using silver alginate and offloading this area with a surgical shoe. 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 He apparently had shoulder surgery in the fall 2020 I did not look this up he apparently has hardware in this area. Recent x-ray done in Dr. Pasty Arch office on 6/7 was negative for osteo- ABIs have always been noncompressible. Is not previously been thought that arterial insufficiency is contributing to this. 6/29; patient returns the clinic. I thought he might be going to see Dr. Earleen Newport ongoing for this wound but he is back today. He did see Dr.  Earleen Newport on 11/22/2019 I believe he agreed with the MRI and apparently was making some arrangements to have this done in [facility] which I am fine with if they can compare with the study of March 2. In any case he returns with a clinic of the wound is not as good. He has been using silver alginate. He is still on another antibiotic that he says Dr. Earleen Newport prescribed 7/8; the patient had his MRI which showed a progressive soft tissue ulceration plantar to the fifth MTP joint with increased soft tissue enhancement throughout the small toe no abscess was noted. There was progressive signal changes in the fifth metatarsal head and fifth proximal phalanx without cortical destruction or definite abnormal T1 signal. These findings are nonspecific could be secondary to hyperemia or early osteomyelitis. He is on amoxicillin as directed by Dr. Earleen Newport. There were plans to do a bone biopsy and culture done in the OR on Friday however the patient canceled this because of insurance issues [surgical center not in [place]. In any case I have told him that this procedure in my opinion needs to be done. He is using Silvadene cream Objective Constitutional Patient is hypertensive.. Pulse regular and within target range for patient.Marland Kitchen Respirations regular, non-labored and within target range.. Temperature is normal and within the target range for the patient.Marland Kitchen Appears in no distress. Vitals Time Taken: 8:28 AM, Height: 76 in, Weight: 255 lbs, BMI: 31, Temperature: 98.5 F, Pulse: 70 bpm, Respiratory Rate: 18 breaths/min, Blood Pressure: 141/79 mmHg. Cardiovascular Pedal pulses are palpable. Musculoskeletal No obvious effusion in the fifth MTP joint.. General Notes: Wound exam; open wound on the plantar fifth metatarsal head. This is a full-thickness wound that goes through the skin and into the muscle layer of the foot. There was some exposed tendon last time it is not visible this time however I think it simply  denatured. This is right against the bone. There is no gross purulence present. The entire wound area extending into the dorsal part of the fifth MTP however is warm to the touch. The erythema that I marked last time he is here is receded however. Integumentary (Hair, Skin) Wound #3 status is Open. Original cause of wound was Gradually Appeared. The  wound is located on the Left Metatarsal head fifth. The wound measures 2cm length x 2.7cm width x 0.4cm depth; 4.241cm^2 area and 1.696cm^3 volume. There is tendon and Fat Layer (Subcutaneous Tissue) Exposed exposed. There is no tunneling or undermining noted. There is a medium amount of serosanguineous drainage noted. The wound margin is flat and intact. There is large (67-100%) red granulation within the wound bed. There is a small (1-33%) amount of necrotic tissue within the wound bed including Adherent Slough. Assessment Active Problems ICD-10 Type 1 diabetes mellitus with foot ulcer Non-pressure chronic ulcer of other part of left foot with other specified severity Type 1 diabetes mellitus with diabetic polyneuropathy Other specified polyneuropathies Plan Follow-up Appointments: Other: - Continue to follow with [name] - call back if needed to schedule follow up appt Discharge From [facility] Services: Discharge from [facility] Frequency: Wound #3 Left Metatarsal head fifth: Change dressing every day. Wound Cleansing: Wound #3 Left Metatarsal head fifth: May shower and wash wound with soap and water. Primary Wound Dressing: Wound #3 Left Metatarsal head fifth: Calcium Alginate with Silver Secondary Dressing: Wound #3 Left Metatarsal head fifth: Foam - foam donut Kerlix/Rolled Gauze Dry Gauze Off-Loading: Wedge shoe to: - front foot offloader 1. We use silver alginate. The patient is using Silvadene cream at home. 2. I am frankly shocked that the MRI does not show any more progressive change since the previous  study of 3/2. In any case I am still concerned based on the depth of the wound the deterioration and the warmth of the wound area. I totally agree with the idea of a bone culture and bone for put pathology. It would be better if we stop the amoxicillin before we do this. I have encouraged him to go forward with this as soon as possible. He is going to call and see if he can rebook the procedure on Friday 3. There is no point tenderness patient going to 2 clinics for the same issue. I think if the bone culture and pathology is negative he probably should be treated with 2 weeks of Levaquin and Flagyl for soft tissue infection and then consideration of a total contact cast. Obviously this is positive he will need a prolonged course of probably IV antibiotics directed by infectious disease 4. I have asked him to call us if we can be more help after the bone biopsy and culture Electronic Signature(s) Signed: 12/08/2019 5:28:43 PM By: [name] MD Entered By: [name] on 12/08/2019 09:23:12 -------------------------------------------------------------------------------- SuperBill Details Patient Name: Date of Service: [facility] [place] [facility]. 07/11/6379 Medical Record Number: [id number] Patient Account Number: [id number] Date of Birth/Sex: Treating RN: 23-Oct-1965 (54 y.o. [name] Primary Care Provider: [name] Other Clinician: Referring Provider: Treating Provider/Extender: [name], [name] in Treatment: 2 Diagnosis Coding ICD-10 Codes Code Description [phone] Type 1 diabetes mellitus with foot ulcer L97.528 Non-pressure chronic ulcer of other part of left foot with other specified severity E10.42 Type 1 diabetes mellitus with diabetic polyneuropathy G62.89 Other specified polyneuropathies Facility Procedures CPT4 Code: [id number] Description: 99213 - WOUND CARE VISIT-LEV 3 EST PT Modifier: Quantity: 1 Physician Procedures : CPT4 Code  Description Modifier [id number] - WC PHYS LEVEL 3 - EST PT ICD-10 Diagnosis Description E10.621 Type 1 diabetes mellitus with foot ulcer L97.528 Non-pressure chronic ulcer of other part of left foot with other specified severity E10.42  Type 1 diabetes mellitus with diabetic polyneuropathy Quantity: 1 Electronic Signature(s) Signed: 12/08/2019  5:28:43 PM By: [name] MD Signed: 12/12/2019 5:04:17 PM By: [name] RN, BSN Entered By: [name] on 12/08/2019 10:46:35

## 2019-12-13 NOTE — Telephone Encounter (Signed)
Pt called for surgical labs.

## 2019-12-13 NOTE — Telephone Encounter (Signed)
When this message was sent I had contacted him several times and left VM. I was able to reach him while I was on vacation.

## 2019-12-15 ENCOUNTER — Telehealth: Payer: Self-pay | Admitting: Podiatry

## 2019-12-15 ENCOUNTER — Encounter: Payer: Self-pay | Admitting: Podiatry

## 2019-12-15 NOTE — Telephone Encounter (Signed)
DR. Ardelle Anton states the wound culture has not been finalized yet and was checked this morning, but so far not final results.

## 2019-12-15 NOTE — Telephone Encounter (Signed)
Please let him know that the result has been finalized yet and we checked this morning. So far it is not growing anything.

## 2019-12-15 NOTE — Telephone Encounter (Signed)
I spoke with pt and informed that Dr. Ardelle Anton had said the preliminary showed no organism, but would have the final over the weekend. Pt states he has an appt Monday and will discuss then.

## 2019-12-15 NOTE — Telephone Encounter (Signed)
Returning  nurse call  Please assist

## 2019-12-19 ENCOUNTER — Other Ambulatory Visit: Payer: Self-pay

## 2019-12-19 ENCOUNTER — Ambulatory Visit (INDEPENDENT_AMBULATORY_CARE_PROVIDER_SITE_OTHER): Payer: BLUE CROSS/BLUE SHIELD | Admitting: Podiatry

## 2019-12-19 ENCOUNTER — Ambulatory Visit (INDEPENDENT_AMBULATORY_CARE_PROVIDER_SITE_OTHER): Payer: BLUE CROSS/BLUE SHIELD

## 2019-12-19 DIAGNOSIS — M869 Osteomyelitis, unspecified: Secondary | ICD-10-CM | POA: Diagnosis not present

## 2019-12-19 DIAGNOSIS — E08621 Diabetes mellitus due to underlying condition with foot ulcer: Secondary | ICD-10-CM

## 2019-12-19 DIAGNOSIS — L97522 Non-pressure chronic ulcer of other part of left foot with fat layer exposed: Secondary | ICD-10-CM

## 2019-12-19 DIAGNOSIS — E1149 Type 2 diabetes mellitus with other diabetic neurological complication: Secondary | ICD-10-CM

## 2019-12-20 ENCOUNTER — Telehealth: Payer: Self-pay | Admitting: *Deleted

## 2019-12-20 DIAGNOSIS — L97522 Non-pressure chronic ulcer of other part of left foot with fat layer exposed: Secondary | ICD-10-CM

## 2019-12-20 DIAGNOSIS — M869 Osteomyelitis, unspecified: Secondary | ICD-10-CM

## 2019-12-20 DIAGNOSIS — E08621 Diabetes mellitus due to underlying condition with foot ulcer: Secondary | ICD-10-CM

## 2019-12-20 DIAGNOSIS — G600 Hereditary motor and sensory neuropathy: Secondary | ICD-10-CM

## 2019-12-20 DIAGNOSIS — E1149 Type 2 diabetes mellitus with other diabetic neurological complication: Secondary | ICD-10-CM

## 2019-12-20 NOTE — Telephone Encounter (Signed)
[name] states she will fax the 12/09/2019 op note to [phone].

## 2019-12-20 NOTE — Telephone Encounter (Signed)
Faxed referral, 12/09/2019 op note, MRI results and 12/19/2019 clinicals with demographics to [facility].

## 2019-12-20 NOTE — Progress Notes (Signed)
Subjective: 54 year old male with history of type 2 diabetes with neuropathy, Charcot-Marie-Tooth presents the office today for follow evaluation of left foot cellulitis, ulceration.  Since I last saw him he had MRI performed which was concerning for possible osteomyelitis of the fifth metatarsal.  He underwent bone biopsy with Dr. Lilian Kapur.  Since the procedure is not yet followed back up with the wound care center.  He has been continuing with daily dressing changes.  He feels that over the last couple days the wound has become more superficial.  Is not had much redness.  Is also been off antibiotics now for the last 1 week. Denies any fevers, chills, nausea, vomiting.  No calf pain, chest pain, shortness of breath.   Objective: AAO x3, NAD DP/PT pulses palpable bilaterally, CRT less than 3 seconds Ulceration present submetatarsal 5 with hyperkeratotic tissue along the periwound.  After debridement the wound measured 1.8 x 1.5 centimeters however somewhat more superficial.  There is no exposed bone or tendon today.  Wound appears to be granular with a small amount of central fibrotic tissue.  Faint rim of erythema but there is no ascending cellulitis.  There is no fluctuation or crepitation peer there is no malodor.  Cavus foot type is present.  No pain with calf compression, swelling, warmth, erythema  Assesment: Ulceration of the left foot with localized erythema  Plan: -All treatment options discussed with the patient including all alternatives, risks, complications.  -Sharply debrided the wound talus #312 with scalpel as well as tissue nipper to remove the central aspect of fibrotic tissue down to healthy, viable, granular tissue. -Recent underwent bone biopsy which was negative for osteomyelitis on culture and pathology.  She does wound continue local wound care.  Continue daily dressing changes.  Continue with offloading.  Discussed with him again try to stay off the foot as much as possible  to allow this to heal.  Also at this point his insurance requires him to be seen by [facility] I will put a referral in for [facility] podiatry for further evaluation and treatment.  I am happy to see him as well if needed.   Vivi Barrack DPM

## 2019-12-29 ENCOUNTER — Telehealth: Payer: Self-pay | Admitting: *Deleted

## 2019-12-29 NOTE — Telephone Encounter (Signed)
OK. We can do a TCC, please schedule him for the end of the day to do this on either a Monday or Tuesday. Misty Stanley- please make sure we have the supplies.

## 2019-12-29 NOTE — Telephone Encounter (Signed)
Pt called states the circumference of the wound is smaller and it is looking better while he is in the boot, but would like to advance therapy with the total contact cast. Pt states Wound Care - Dr. Leanord Hawking had not wanted to use the total contact cast at the time of his visit because the ulcer was red and irritate and that is not the case at this time. Pt states although his insurance is with [facility] he would like to continue his care with Dr. Ardelle Anton and pay out of pocket for the total contact cast therapy.

## 2020-01-04 ENCOUNTER — Telehealth: Payer: Self-pay | Admitting: *Deleted

## 2020-01-04 MED ORDER — DOXYCYCLINE HYCLATE 100 MG PO CAPS: 100.0000 mg | ORAL_CAPSULE | Freq: Two times a day (BID) | ORAL | 0 refills | Status: DC

## 2020-01-04 NOTE — Telephone Encounter (Signed)
I spoke with pt and informed of Dr. Gabriel Rung orders for doxycycline and appt tomorrow and that Dr. Ardelle Anton had seen him this week standing on the foot and that wasn't he doing himself any good. Pt is scheduled for 8:15am tomorrow.

## 2020-01-04 NOTE — Telephone Encounter (Addendum)
Pt states he has an appt Monday, but noticed the wound had been small but is now also up the edge of the foot, the odor has changed and it is deeper, he stated "I'm not freaked out", if he thinks I should wait until Monday. Pt states he has been on it more in the boot.

## 2020-01-05 ENCOUNTER — Telehealth: Payer: Self-pay | Admitting: *Deleted

## 2020-01-05 ENCOUNTER — Other Ambulatory Visit: Payer: Self-pay

## 2020-01-05 ENCOUNTER — Ambulatory Visit (INDEPENDENT_AMBULATORY_CARE_PROVIDER_SITE_OTHER): Payer: BLUE CROSS/BLUE SHIELD | Admitting: Podiatry

## 2020-01-05 DIAGNOSIS — L97522 Non-pressure chronic ulcer of other part of left foot with fat layer exposed: Secondary | ICD-10-CM

## 2020-01-05 DIAGNOSIS — E08621 Diabetes mellitus due to underlying condition with foot ulcer: Secondary | ICD-10-CM

## 2020-01-05 DIAGNOSIS — E1149 Type 2 diabetes mellitus with other diabetic neurological complication: Secondary | ICD-10-CM | POA: Diagnosis not present

## 2020-01-05 DIAGNOSIS — L03116 Cellulitis of left lower limb: Secondary | ICD-10-CM | POA: Diagnosis not present

## 2020-01-05 MED ORDER — DOXYCYCLINE HYCLATE 100 MG PO CAPS: 100.0000 mg | ORAL_CAPSULE | Freq: Two times a day (BID) | ORAL | 0 refills | Status: DC

## 2020-01-05 NOTE — Telephone Encounter (Signed)
Pt states [name] computer system is down and he would like antibiotic sent to [facility] at [facility], he really thinks he needs it.

## 2020-01-05 NOTE — Telephone Encounter (Signed)
I called pt and asked if he was feeling worse after seeing Dr. Ardelle Anton today and he stated no, he felt good about seeing him, but [name]'s internet was down and he would like the antibiotic sent to the [place] at [employer]. I resent doxycycline to [facility]

## 2020-01-06 ENCOUNTER — Telehealth: Payer: Self-pay | Admitting: *Deleted

## 2020-01-06 MED ORDER — SILVER SULFADIAZINE 1 % EX CREA: 1.0000 "application " | TOPICAL_CREAM | Freq: Every day | CUTANEOUS | 3 refills | Status: DC

## 2020-01-06 NOTE — Telephone Encounter (Signed)
Pt states Dr. Ardelle Anton had wanted him to use the silvadene cream not the silver pads. I refilled the silvadene Cream.

## 2020-01-06 NOTE — Progress Notes (Signed)
Subjective: 54 year old male with history of type 2 diabetes with neuropathy, Charcot-Marie-Tooth presents the office today for follow evaluation of left foot cellulitis, ulceration.  He called yesterday stating the wound was getting worse he noticed the redness coming back.  He states he is on his feet quite a bit recently and he recently just had a long talk for job which probably aggravated symptoms.  Overall he feels well and denies any systemic concerns including fevers, chills, nausea, vomiting.  No calf pain, chest pain, shortness of breath.  He would like to do a total contact cast when able.  He has no other concerns today.   Objective: AAO x3, NAD DP/PT pulses palpable bilaterally, CRT less than 3 seconds Ulceration present submetatarsal 5 with hyperkeratotic tissue along the periwound.  The wound is larger today extending more to the lateral fifth metatarsal head.  There was some fibrotic, granular tissue as well as macerated tissue on the periwound.  Small amount of purulence identified lateral fifth metatarsal head.  This was cultured today.  There is erythema to the lateral aspect of the foot going dorsally but no streaking.  Mild warmth to the foot.  There is no fluctuation or crepitation.  There is no malodor. Cavus foot type is present.  No pain with calf compression, swelling, warmth, erythema  Assesment: Worsening ulceration left foot with cellulitis likely due to increased activity  Plan: -All treatment options discussed with the patient including all alternatives, risks, complications.  -Declined x-rays -Sharply debrided the wound today utilizing a #312 with scalpel as well as tissue nipper to remove nonviable tissue.  There was purulence identified which was cultured.  He was started on doxycycline history and continue this for now.  Continue daily dressing changes with Silvadene which was also ordered today. -We long discussion regarding treatment options.  I discussed with  him fifth metatarsal head excision as well to surgery.  He wants to try for total contact cast.  Infection is improved next appointment would consider doing this.  I encouraged him to strongly dressing of the foot is much as possible.  I had Raiford Noble our pedorthotist try to further offload the wound. -Monitor for any clinical signs or symptoms of infection and directed to call the office immediately should any occur or go to the ER.  Follow-up next week as scheduled or sooner if needed  Vivi Barrack DPM

## 2020-01-09 ENCOUNTER — Other Ambulatory Visit: Payer: Self-pay

## 2020-01-09 ENCOUNTER — Ambulatory Visit (INDEPENDENT_AMBULATORY_CARE_PROVIDER_SITE_OTHER): Payer: BLUE CROSS/BLUE SHIELD | Admitting: Podiatry

## 2020-01-09 DIAGNOSIS — E08621 Diabetes mellitus due to underlying condition with foot ulcer: Secondary | ICD-10-CM

## 2020-01-09 DIAGNOSIS — E1149 Type 2 diabetes mellitus with other diabetic neurological complication: Secondary | ICD-10-CM | POA: Diagnosis not present

## 2020-01-09 DIAGNOSIS — L97522 Non-pressure chronic ulcer of other part of left foot with fat layer exposed: Secondary | ICD-10-CM

## 2020-01-09 DIAGNOSIS — L03116 Cellulitis of left lower limb: Secondary | ICD-10-CM | POA: Diagnosis not present

## 2020-01-09 LAB — WOUND CULTURE
MICRO NUMBER:: 10791994
SPECIMEN QUALITY:: ADEQUATE

## 2020-01-09 NOTE — Patient Instructions (Signed)

## 2020-01-09 NOTE — Progress Notes (Signed)
Subjective: 54 year old male with history of type 2 diabetes with neuropathy, Charcot-Marie-Tooth presents the office today for follow evaluation of left foot cellulitis, ulceration.  States the wound is about the same.  Some occasional drainage but no pus.  The redness has come down some.  He is on the antibiotic with any side effects.  No other concerns.  Denies any fevers, chills, nausea, vomiting.  No calf pain, chest pain, shortness of breath.  Objective: AAO x3, NAD DP/PT pulses palpable bilaterally, CRT less than 3 seconds Ulceration present submetatarsal 5 and also extending to lateral fifth metatarsal head.  There is fibrotic tissue present of the central aspect as well as granular tissue on the periphery of the wound.  I am able to probe the fifth metatarsal head today but appears large.  There is surrounding erythema but appears to be improved compared to last week's appointment.  There is no purulence identified today.  There is no fluctuation crepitation.  There is no malodor. Cavus foot type is present.  No pain with calf compression, swelling, warmth, erythema  Assesment: Worsening ulceration left foot with cellulitis  Plan: -All treatment options discussed with the patient including all alternatives, risks, complications.  -At this point able to probe the fifth metatarsal head and I discussed with him surgical excision versus amputation.  Recommend plan on wound excision, fifth metatarsal head/fifth metatarsal partial metatarsectomy on Wednesday.  Discussed that he is still at risk of amputation and will spread of infection and further surgery may be needed. -The incision placement as well as the postoperative course was discussed with the patient. I discussed risks of the surgery which include, but not limited to, infection, bleeding, pain, swelling, need for further surgery, delayed or nonhealing, painful or ugly scar, numbness or sensation changes, over/under correction,  recurrence, transfer lesions, further deformity, hardware failure, DVT/PE, loss of toe/foot. Patient understands these risks and wishes to proceed with surgery. The surgical consent was reviewed with the patient all 3 pages were signed. No promises or guarantees were given to the outcome of the procedure. All questions were answered to the best of my ability. Before the surgery the patient was encouraged to call the office if there is any further questions. The surgery will be performed at the [facility] on an outpatient basis. -Awaiting final wound culture.  Continue current antibiotics.  No follow-ups on file.  Vivi Barrack DPM

## 2020-01-10 ENCOUNTER — Telehealth: Payer: Self-pay | Admitting: *Deleted

## 2020-01-10 NOTE — Telephone Encounter (Addendum)
DOS 01/11/2020 CPT CODES: METATARSAL HEAD RESECTION 5TH - 28113 AND WOUND EXCISION - 11043 RIGHT FOOT  BCBS: ELIGIBILITY DATE - 06/03/2019 - 06/01/2020  CO-INSURANCE: $0 DEDUCTIBLE: $7000 MET/ COVER AT 100% OUT OF POCKET: $7000 MAX/  NOT MET  REFERRAL NOT REQUIRED AUTHORIZATION NOT REQUIRED, CODES VALID AND BILLABLE.  CALL REF# [id number]

## 2020-01-11 ENCOUNTER — Other Ambulatory Visit: Payer: Self-pay | Admitting: Podiatry

## 2020-01-11 ENCOUNTER — Encounter: Payer: Self-pay | Admitting: Podiatry

## 2020-01-11 MED ORDER — PROMETHAZINE HCL 25 MG PO TABS
25.0000 mg | ORAL_TABLET | Freq: Three times a day (TID) | ORAL | 0 refills | Status: DC | PRN
Start: 2020-01-11 — End: 2023-07-31

## 2020-01-11 MED ORDER — AMOXICILLIN-POT CLAVULANATE 875-125 MG PO TABS: 1.0000 | ORAL_TABLET | Freq: Two times a day (BID) | ORAL | 0 refills | Status: DC

## 2020-01-11 MED ORDER — HYDROCODONE-ACETAMINOPHEN 5-325 MG PO TABS: 1.0000 | ORAL_TABLET | Freq: Four times a day (QID) | ORAL | 0 refills | Status: DC | PRN

## 2020-01-11 NOTE — Progress Notes (Signed)
Post-op medications sent to the pharmacy  Will change him to Augmentin.

## 2020-01-12 ENCOUNTER — Telehealth: Payer: Self-pay | Admitting: *Deleted

## 2020-01-12 NOTE — Telephone Encounter (Signed)
Called and left a message for the patient stating that I was calling to see how the patient was doing after having surgery on Wednesday January 11, 2020 with Dr Ardelle Anton and stated to call the office if any concerns or questions and also stated that the patient could go thru my chart and that we would see the patient at the next post op visit. [name]

## 2020-01-13 ENCOUNTER — Other Ambulatory Visit: Payer: Self-pay

## 2020-01-13 ENCOUNTER — Ambulatory Visit (INDEPENDENT_AMBULATORY_CARE_PROVIDER_SITE_OTHER): Payer: BLUE CROSS/BLUE SHIELD

## 2020-01-13 ENCOUNTER — Ambulatory Visit (INDEPENDENT_AMBULATORY_CARE_PROVIDER_SITE_OTHER): Payer: BLUE CROSS/BLUE SHIELD | Admitting: Podiatry

## 2020-01-13 VITALS — Temp 99.9°F

## 2020-01-13 DIAGNOSIS — L03116 Cellulitis of left lower limb: Secondary | ICD-10-CM | POA: Diagnosis not present

## 2020-01-13 DIAGNOSIS — E1149 Type 2 diabetes mellitus with other diabetic neurological complication: Secondary | ICD-10-CM

## 2020-01-13 DIAGNOSIS — E08621 Diabetes mellitus due to underlying condition with foot ulcer: Secondary | ICD-10-CM

## 2020-01-13 DIAGNOSIS — L97522 Non-pressure chronic ulcer of other part of left foot with fat layer exposed: Secondary | ICD-10-CM

## 2020-01-15 NOTE — Progress Notes (Signed)
Subjective: [name] is a 54 y.o. is seen today in office s/p left foot wound excision, fifth metatarsal head excision preformed on 12/09/2019.  States his pain is controlled and contact the office foot since surgery.  He has no new concerns.  States the general surgery daily he has not had a fever and he feels well.  Denies any systemic complaints such as fevers, chills, nausea, vomiting. No calf pain, chest pain, shortness of breath.   Objective: General: No acute distress, AAOx3  DP/PT pulses palpable 2/4, CRT < 3 sec to all digits.  Right foot: Incision is well coapted without any evidence of dehiscence with sutures intact with central area packed open. There is mild surrounding erythema without any ascending cellulitis, fluctuance, crepitus, malodor, drainage/purulence. There is mild edema around the surgical site. There is now pain along the surgical site.  No other areas of tenderness to bilateral lower extremities.  No other open lesions or pre-ulcerative lesions.  No pain with calf compression, swelling, warmth, erythema.   Assessment and Plan:  Status post left foot surgery, doing well with no complications   -Treatment options discussed including all alternatives, risks, and complications -X-rays obtained reviewed.  Status post fifth metatarsal head excision -There is mild erythema but actually proved compared to what it was preoperatively.  He did have a fever today of 99.9 although he is instructed at home he is not had a fever and he feels well.  Discussed wound check but daily. -Continue Augmentin -Recommend nonweightbearing -Elevation -Monitor for any clinical signs or symptoms of infection and DVT/PE and directed to call the office immediately should any occur or go to the ER. -Follow-up as scheduled or sooner if any problems arise. In the meantime, encouraged to call the office with any questions, concerns, change in symptoms.   Ovid Curd, DPM

## 2020-01-19 ENCOUNTER — Other Ambulatory Visit: Payer: Self-pay

## 2020-01-19 ENCOUNTER — Ambulatory Visit (INDEPENDENT_AMBULATORY_CARE_PROVIDER_SITE_OTHER): Payer: BLUE CROSS/BLUE SHIELD | Admitting: Podiatry

## 2020-01-19 ENCOUNTER — Ambulatory Visit (INDEPENDENT_AMBULATORY_CARE_PROVIDER_SITE_OTHER): Payer: BLUE CROSS/BLUE SHIELD

## 2020-01-19 DIAGNOSIS — L03116 Cellulitis of left lower limb: Secondary | ICD-10-CM | POA: Diagnosis not present

## 2020-01-19 DIAGNOSIS — E1149 Type 2 diabetes mellitus with other diabetic neurological complication: Secondary | ICD-10-CM

## 2020-01-19 DIAGNOSIS — L97522 Non-pressure chronic ulcer of other part of left foot with fat layer exposed: Secondary | ICD-10-CM

## 2020-01-19 DIAGNOSIS — E08621 Diabetes mellitus due to underlying condition with foot ulcer: Secondary | ICD-10-CM

## 2020-01-22 NOTE — Progress Notes (Signed)
Subjective: [name] is a 54 y.o. is seen today in office s/p left foot wound excision, fifth metatarsal head excision preformed on 12/09/2019.  States that he has been doing well and have some occasional achiness mostly at nighttime.  Still tracks about the foot is much as possible.  He does report that he has not tried to [place] this weekend. Denies any systemic complaints such as fevers, chills, nausea, vomiting. No calf pain, chest pain, shortness of breath.   Objective: General: No acute distress, AAOx3  DP/PT pulses palpable 2/4, CRT < 3 sec to all digits.  Right foot: Incision is well coapted without any evidence of dehiscence with sutures intact with a small opening on the central aspect which had the packing regimen.  Erythema appears to be almost resolved and there is no drainage or pus except for small no bloody drainage upon probing of the central wound.  There is no fluctuation crepitation.  There is no malodor.  Mild edema but appears to be improved.   No other areas of tenderness to bilateral lower extremities.  No other open lesions or pre-ulcerative lesions.  No pain with calf compression, swelling, warmth, erythema.   Assessment and Plan:  Status post left foot surgery, doing well   -Treatment options discussed including all alternatives, risks, and complications -Incision appears to be doing well infection appears to be improved.  Antibiotic ointment and a dressing applied.  Keep dressing clean, dry, intact no plantar tenderness acute.  He is currently on his feet more this weekend which has been in the past we discussed this today.  Encouraged elevation. -Monitor for any clinical signs or symptoms of infection and directed to call the office immediately should any occur or go to the ER.  No follow-ups on file.  Vivi Barrack DPM

## 2020-01-26 ENCOUNTER — Ambulatory Visit (INDEPENDENT_AMBULATORY_CARE_PROVIDER_SITE_OTHER): Payer: BLUE CROSS/BLUE SHIELD | Admitting: Podiatry

## 2020-01-26 ENCOUNTER — Encounter: Payer: Self-pay | Admitting: Podiatry

## 2020-01-26 ENCOUNTER — Other Ambulatory Visit: Payer: Self-pay

## 2020-01-26 VITALS — BP 132/73 | Temp 97.7°F

## 2020-01-26 DIAGNOSIS — E1149 Type 2 diabetes mellitus with other diabetic neurological complication: Secondary | ICD-10-CM

## 2020-01-26 DIAGNOSIS — L03116 Cellulitis of left lower limb: Secondary | ICD-10-CM

## 2020-01-26 DIAGNOSIS — E08621 Diabetes mellitus due to underlying condition with foot ulcer: Secondary | ICD-10-CM

## 2020-01-26 DIAGNOSIS — L97522 Non-pressure chronic ulcer of other part of left foot with fat layer exposed: Secondary | ICD-10-CM

## 2020-01-26 MED ORDER — AMOXICILLIN-POT CLAVULANATE 875-125 MG PO TABS
1.0000 | ORAL_TABLET | Freq: Two times a day (BID) | ORAL | 0 refills | Status: DC
Start: 2020-01-26 — End: 2020-07-11

## 2020-01-26 MED ORDER — HYDROCODONE-ACETAMINOPHEN 5-325 MG PO TABS: 1.0000 | ORAL_TABLET | Freq: Four times a day (QID) | ORAL | 0 refills | Status: DC | PRN

## 2020-01-29 NOTE — Progress Notes (Signed)
Subjective: [name] is a 54 y.o. is seen today in office s/p left foot wound excision, fifth metatarsal head excision preformed on 12/09/2019.  Since I last saw him he did drive to [place] he flew back home.  He is try to stay off the foot is much as possible but still the foot was in a dependent position for a long period of time.  Also he states he is fallen off the knee scooter and has abrasion on his leg.  Denies any systemic complaints such as fevers, chills, nausea, vomiting. No calf pain, chest pain, shortness of breath.   Objective: General: No acute distress, AAOx3  DP/PT pulses palpable 2/4, CRT < 3 sec to all digits.  Right foot: Incision is well coapted without any evidence of dehiscence with sutures intact with a small opening on the central aspect which probes approximately 1 cm.  Mild swelling to the foot but there is no significant warmth.  Slight erythema but it is much improved compared to preoperatively. Superficial abrasion on the leg. No other areas of tenderness to bilateral lower extremities.  No other open lesions or pre-ulcerative lesions.  No pain with calf compression, swelling, warmth, erythema.   Assessment and Plan:  Status post left foot surgery  -Treatment options discussed including all alternatives, risks, and complications -Incision appears to be healing well but the central aspect is still open improving but filling in slowly.  I will refer him to the wound care center for assistance in helping to get the wound to heal.  Continue to elevate foot is much as possible and elevate.  Unfortunately he had to go out of town this weekend.  -Dressing applied today.  He can also change the bandage every other day. -Augmentin -Monitor for any clinical signs or symptoms of infection and directed to call the office immediately should any occur or go to the ER.  Vivi Barrack DPM

## 2020-02-02 ENCOUNTER — Ambulatory Visit (INDEPENDENT_AMBULATORY_CARE_PROVIDER_SITE_OTHER): Payer: BLUE CROSS/BLUE SHIELD | Admitting: Podiatry

## 2020-02-02 ENCOUNTER — Ambulatory Visit (INDEPENDENT_AMBULATORY_CARE_PROVIDER_SITE_OTHER): Payer: BLUE CROSS/BLUE SHIELD

## 2020-02-02 ENCOUNTER — Other Ambulatory Visit: Payer: Self-pay

## 2020-02-02 DIAGNOSIS — L97522 Non-pressure chronic ulcer of other part of left foot with fat layer exposed: Secondary | ICD-10-CM

## 2020-02-02 DIAGNOSIS — L03116 Cellulitis of left lower limb: Secondary | ICD-10-CM

## 2020-02-07 ENCOUNTER — Encounter (HOSPITAL_BASED_OUTPATIENT_CLINIC_OR_DEPARTMENT_OTHER): Payer: BLUE CROSS/BLUE SHIELD | Attending: Physician Assistant | Admitting: Internal Medicine

## 2020-02-07 DIAGNOSIS — E10622 Type 1 diabetes mellitus with other skin ulcer: Secondary | ICD-10-CM | POA: Insufficient documentation

## 2020-02-07 DIAGNOSIS — E1069 Type 1 diabetes mellitus with other specified complication: Secondary | ICD-10-CM | POA: Insufficient documentation

## 2020-02-07 DIAGNOSIS — M869 Osteomyelitis, unspecified: Secondary | ICD-10-CM | POA: Diagnosis not present

## 2020-02-07 DIAGNOSIS — X58XXXA Exposure to other specified factors, initial encounter: Secondary | ICD-10-CM | POA: Diagnosis not present

## 2020-02-07 DIAGNOSIS — I10 Essential (primary) hypertension: Secondary | ICD-10-CM | POA: Insufficient documentation

## 2020-02-07 DIAGNOSIS — T8131XD Disruption of external operation (surgical) wound, not elsewhere classified, subsequent encounter: Secondary | ICD-10-CM | POA: Insufficient documentation

## 2020-02-07 DIAGNOSIS — L97522 Non-pressure chronic ulcer of other part of left foot with fat layer exposed: Secondary | ICD-10-CM | POA: Insufficient documentation

## 2020-02-07 DIAGNOSIS — E1042 Type 1 diabetes mellitus with diabetic polyneuropathy: Secondary | ICD-10-CM | POA: Diagnosis not present

## 2020-02-07 DIAGNOSIS — Z794 Long term (current) use of insulin: Secondary | ICD-10-CM | POA: Insufficient documentation

## 2020-02-07 DIAGNOSIS — G6181 Chronic inflammatory demyelinating polyneuritis: Secondary | ICD-10-CM | POA: Insufficient documentation

## 2020-02-07 DIAGNOSIS — G600 Hereditary motor and sensory neuropathy: Secondary | ICD-10-CM | POA: Insufficient documentation

## 2020-02-07 NOTE — Progress Notes (Signed)
LAMARCO, GUDIEL ([id number]) Visit Report for 02/07/2020 Abuse/Suicide Risk Screen Details Patient Name: Date of Service: [facility], [place] 02/07/2020 1:15 PM Medical Record Number: [id number] Patient Account Number: [id number] Date of Birth/Sex: Treating RN: Aug 12, 1965 (54 y.o. [name] Primary Care Abbegayle Denault: Ananias Pilgrim Other Clinician: Referring Carmine Carrozza: Treating Orianna Biskup/Extender: [name], [name] in Treatment: 0 Abuse/Suicide Risk Screen Items Answer ABUSE RISK SCREEN: Has anyone close to you tried to hurt or harm you recentlyo No Do you feel uncomfortable with anyone in your familyo No Has anyone forced you do things that you didnt want to doo No Electronic Signature(s) Signed: 02/07/2020 5:29:26 PM By: Zenaida Deed RN, BSN Entered By: Zenaida Deed on 02/07/2020 13:39:31 -------------------------------------------------------------------------------- Activities of Daily Living Details Patient Name: Date of Service: [facility], [place] 02/07/2020 1:15 PM Medical Record Number: [id number] Patient Account Number: [id number] Date of Birth/Sex: Treating RN: [birth year] (54 y.o. [name] Primary Care Andriana Casa: Ananias Pilgrim Other Clinician: Referring Kash Davie: Treating Isiac Breighner/Extender: [name], [name] in Treatment: 0 Activities of Daily Living Items Answer Activities of Daily Living (Please select one for each item) Drive Automobile Completely Able T Medications ake Completely Able Use T elephone Completely Able Care for Appearance Completely Able Use T oilet Completely Able Bath / Shower Completely Able Dress Self Completely Able Feed Self Completely Able Walk Completely Able Get In / Out Bed Completely Able Housework Completely Able Prepare Meals Completely Able Handle Money Completely Able Shop for Self Completely Able Electronic Signature(s) Signed: 02/07/2020 5:29:26 PM By: Zenaida Deed  RN, BSN Entered By: Zenaida Deed on 02/07/2020 13:39:48 -------------------------------------------------------------------------------- Education Screening Details Patient Name: Date of Service: [facility], [name] 02/07/2020 1:15 PM Medical Record Number: [id number] Patient Account Number: [id number] Date of Birth/Sex: Treating RN: 1966/05/20 (54 y.o. [name] Primary Care Mycah Mcdougall: Ananias Pilgrim Other Clinician: Referring Marla Pouliot: Treating Madell Heino/Extender: Alta Corning in Treatment: 0 Primary Learner Assessed: Patient Learning Preferences/Education Level/Primary Language Learning Preference: Explanation, Demonstration, Printed Material Highest Education Level: College or Above Preferred Language: English Cognitive Barrier Language Barrier: No Translator Needed: No Memory Deficit: No Emotional Barrier: No Cultural/Religious Beliefs Affecting Medical Care: No Physical Barrier Impaired Vision: Yes Contacts, left eye Impaired Hearing: No Decreased Hand dexterity: No Knowledge/Comprehension Knowledge Level: High Comprehension Level: High Ability to understand written instructions: High Ability to understand verbal instructions: High Motivation Anxiety Level: Calm Cooperation: Cooperative Education Importance: Acknowledges Need Interest in Health Problems: Asks Questions Perception: Coherent Willingness to Engage in Self-Management High Activities: Readiness to Engage in Self-Management High Activities: Electronic Signature(s) Signed: 02/07/2020 5:29:26 PM By: Zenaida Deed RN, BSN Entered By: Zenaida Deed on 02/07/2020 13:40:54 -------------------------------------------------------------------------------- Fall Risk Assessment Details Patient Name: Date of Service: [facility], [name] 02/07/2020 1:15 PM Medical Record Number: [id number] Patient Account Number: [id number] Date of Birth/Sex: Treating RN: 20-Feb-1966 (53  y.o. [name] Primary Care Saniya Tranchina: Ananias Pilgrim Other Clinician: Referring Cordarious Zeek: Treating Cray Monnin/Extender: [name], Diona Fanti in Treatment: 0 Fall Risk Assessment Items Have you had 2 or more falls in the last 12 monthso 0 Yes Have you had any fall that resulted in injury in the last 12 monthso 0 No FALLS RISK SCREEN History of falling - immediate or within 3 months 25 Yes Secondary diagnosis (Do you have 2 or more medical diagnoseso) 0 No Ambulatory aid None/bed rest/wheelchair/nurse 0 No Crutches/cane/walker 15 Yes Furniture 0 No Intravenous therapy Access/Saline/Heparin Lock 0 No Gait/Transferring Normal/ bed  rest/ wheelchair 0 Yes Weak (short steps with or without shuffle, stooped but able to lift head while walking, may seek 0 No support from furniture) Impaired (short steps with shuffle, may have difficulty arising from chair, head down, impaired 0 No balance) Mental Status Oriented to own ability 0 Yes Electronic Signature(s) Signed: 02/07/2020 5:29:26 PM By: Zenaida Deed RN, BSN Entered By: Zenaida Deed on 02/07/2020 13:41:40 -------------------------------------------------------------------------------- Foot Assessment Details Patient Name: Date of Service: [facility], [name] 02/07/2020 1:15 PM Medical Record Number: [id number] Patient Account Number: [id number] Date of Birth/Sex: Treating RN: July 09, 1965 (54 y.o. [name] Primary Care Aaronmichael Brumbaugh: Ananias Pilgrim Other Clinician: Referring Malakhai Beitler: Treating Yuriko Portales/Extender: [name], [name] in Treatment: 0 Foot Assessment Items Site Locations + = Sensation present, - = Sensation absent, C = Callus, U = Ulcer R = Redness, W = Warmth, M = Maceration, PU = Pre-ulcerative lesion F = Fissure, S = Swelling, D = Dryness Assessment Right: Left: Other Deformity: No No Prior Foot Ulcer: No Yes Prior Amputation: No Yes Charcot Joint: No  No Ambulatory Status: Ambulatory Without Help Gait: Steady Electronic Signature(s) Signed: 02/07/2020 5:29:26 PM By: Zenaida Deed RN, BSN Entered By: Zenaida Deed on 02/07/2020 13:43:22 -------------------------------------------------------------------------------- Nutrition Risk Screening Details Patient Name: Date of Service: [facility], [name] 02/07/2020 1:15 PM Medical Record Number: [id number] Patient Account Number: [id number] Date of Birth/Sex: Treating RN: 08/28/1965 (54 y.o. [name] Primary Care Lyfe Monger: Ananias Pilgrim Other Clinician: Referring Prerana Strayer: Treating Elzy Tomasello/Extender: [name], [name] in Treatment: 0 Height (in): 76 Weight (lbs): 260 Body Mass Index (BMI): 31.6 Nutrition Risk Screening Items Score Screening NUTRITION RISK SCREEN: I have an illness or condition that made me change the kind and/or amount of food I eat 0 No I eat fewer than two meals per day 0 No I eat few fruits and vegetables, or milk products 0 No I have three or more drinks of beer, liquor or wine almost every day 0 No I have tooth or mouth problems that make it hard for me to eat 0 No I don't always have enough money to buy the food I need 0 No I eat alone most of the time 0 No I take three or more different prescribed or over-the-counter drugs a day 1 Yes Without wanting to, I have lost or gained 10 pounds in the last six months 0 No I am not always physically able to shop, cook and/or feed myself 0 No Nutrition Protocols Good Risk Protocol 0 No interventions needed Moderate Risk Protocol High Risk Proctocol Risk Level: Good Risk Score: 1 Electronic Signature(s) Signed: 02/07/2020 5:29:26 PM By: Zenaida Deed RN, BSN Entered By: Zenaida Deed on 02/07/2020 13:41:59

## 2020-02-08 NOTE — Progress Notes (Signed)
[name], [name] ([id number]) Visit Report for 02/07/2020 Allergy List Details Patient Name: Date of Service: [facility], [place] 02/07/2020 1:15 PM Medical Record Number: [id number] Patient Account Number: [id number] Date of Birth/Sex: Treating RN: [birth year] (54 y.o. [name] Primary Care Jene Huq: Ananias Pilgrim Other Clinician: Referring Juliah Scadden: Treating Veleka Djordjevic/Extender: [name], [name] in Treatment: 0 Allergies Active Allergies lithium Reaction: intolerance- uncomfortable feeling Severity: Moderate Adderall Reaction: palpitations Ritalin Reaction: palpitations Allergy Notes Electronic Signature(s) Signed: 02/07/2020 5:29:26 PM By: Zenaida Deed RN, BSN Entered By: Zenaida Deed on 02/07/2020 13:34:28 -------------------------------------------------------------------------------- Arrival Information Details Patient Name: Date of Service: [facility], Erik [name] 02/07/2020 1:15 PM Medical Record Number: [id number] Patient Account Number: [id number] Date of Birth/Sex: Treating RN: Apr 02, 1966 (54 y.o. [name] Primary Care Kirsty Monjaraz: Ananias Pilgrim Other Clinician: Referring Brooklee Michelin: Treating Jonee Lamore/Extender: Alta Corning in Treatment: 0 Visit Information Patient Arrived: Knee Scooter Arrival Time: 13:29 Accompanied By: self Transfer Assistance: None Patient Identification Verified: Yes Secondary Verification Process Completed: Yes Patient Requires Transmission-Based Precautions: No Patient Has Alerts: No History Since Last Visit Any new allergies or adverse reactions: No Had a fall or experienced change in activities of daily living that may affect risk of falls: Yes Signs or symptoms of abuse/neglect since No last visito Hospitalized since last visit: No Has Dressing in Place as Prescribed: Yes Has Footwear/Offloading in Place as Prescribed: Yes Left: Surgical Shoe with Pressure Relief  Insole Pain Present Now: No Electronic Signature(s) Signed: 02/07/2020 5:29:26 PM By: Zenaida Deed RN, BSN Entered By: Zenaida Deed on 02/07/2020 13:32:21 -------------------------------------------------------------------------------- Clinic Level of Care Assessment Details Patient Name: Date of Service: [facility], Erik Universal B. 02/07/2020 1:15 PM Medical Record Number: [id number] Patient Account Number: [id number] Date of Birth/Sex: Treating RN: [birth year] (53 y.o. [name]) [name] Primary Care Joni Colegrove: Ananias Pilgrim Other Clinician: Referring Savahna Casados: Treating Arwin Bisceglia/Extender: [name], [name] in Treatment: 0 Clinic Level of Care Assessment Items TOOL 2 Quantity Score X- 1 0 Use when only an EandM is performed on the INITIAL visit ASSESSMENTS - Nursing Assessment / Reassessment X- 1 20 General Physical Exam (combine w/ comprehensive assessment (listed just below) when performed on new pt. evals) X- 1 25 Comprehensive Assessment (HX, ROS, Risk Assessments, Wounds Hx, etc.) ASSESSMENTS - Wound and Skin A ssessment / Reassessment X - Simple Wound Assessment / Reassessment - one wound 1 5 []  - 0 Complex Wound Assessment / Reassessment - multiple wounds []  - 0 Dermatologic / Skin Assessment (not related to wound area) ASSESSMENTS - Ostomy and/or Continence Assessment and Care []  - 0 Incontinence Assessment and Management []  - 0 Ostomy Care Assessment and Management (repouching, etc.) PROCESS - Coordination of Care X - Simple Patient / Family Education for ongoing care 1 15 []  - 0 Complex (extensive) Patient / Family Education for ongoing care X- 1 10 Staff obtains , Records, T Results / Process Orders est []  - 0 Staff telephones HHA, Nursing Homes / Clarify orders / etc []  - 0 Routine Transfer to another Facility (non-emergent condition) []  - 0 Routine Hospital Admission (non-emergent condition) X- 1 15 New Admissions / / Ordering NPWT Apligraf, etc. , []  - 0 Emergency Hospital Admission (emergent condition) X- 1 10 Simple Discharge Coordination []  - 0 Complex (extensive) Discharge Coordination PROCESS - Special Needs []  - 0 Pediatric / Minor Patient Management []  - 0 Isolation Patient Management []  - 0 Hearing / Language / Visual special needs []  -  0 Assessment of Community assistance (transportation, D/C planning, etc.) []  - 0 Additional assistance / Altered mentation []  - 0 Support Surface(s) Assessment (bed, cushion, seat, etc.) INTERVENTIONS - Wound Cleansing / Measurement X- 1 5 Wound Imaging (photographs - any number of wounds) []  - 0 Wound Tracing (instead of photographs) X- 1 5 Simple Wound Measurement - one wound []  - 0 Complex Wound Measurement - multiple wounds X- 1 5 Simple Wound Cleansing - one wound []  - 0 Complex Wound Cleansing - multiple wounds INTERVENTIONS - Wound Dressings X - Small Wound Dressing one or multiple wounds 1 10 []  - 0 Medium Wound Dressing one or multiple wounds []  - 0 Large Wound Dressing one or multiple wounds []  - 0 Application of Medications - injection INTERVENTIONS - Miscellaneous []  - 0 External ear exam []  - 0 Specimen Collection (cultures, biopsies, blood, body fluids, etc.) []  - 0 Specimen(s) / Culture(s) sent or taken to Lab for analysis []  - 0 Patient Transfer (multiple staff / / Similar devices) []  - 0 Simple Staple / Suture removal (25 or less) []  - 0 Complex Staple / Suture removal (26 or more) []  - 0 Hypo / Hyperglycemic Management (close monitor of Blood Glucose) X- 1 15 Ankle / Brachial Index (ABI) - do not check if billed separately Has the patient been seen at the hospital within the last three years: Yes Total Score: 140 Level Of Care: New/Established - Level 4 Electronic Signature(s) Signed: 02/08/2020 5:14:09 PM By: RN Entered By: on 02/07/2020  14:17:37 -------------------------------------------------------------------------------- Encounter Discharge Information Details Patient Name: Date of Service: [facility], Erik [name] 02/07/2020 1:15 PM Medical Record Number: Patient Account Number: Date of Birth/Sex: Treating RN: May 10, 1966 (54 y.o. Primary Care Anita Mcadory: Nurse, adult Other Clinician: Referring Archit Leger: Treating Mailynn Everly/Extender: in Treatment: 0 Encounter Discharge Information Items Discharge Condition: Stable Ambulatory Status: Ambulatory Discharge Destination: Home Transportation: Private Auto Accompanied By: self Schedule Follow-up Appointment: Yes Clinical Summary of Care: Patient Declined Electronic Signature(s) Signed: 02/07/2020 5:24:19 PM By: Entered By: 04/09/2020 on 02/07/2020 14:39:29 -------------------------------------------------------------------------------- Lower Extremity Assessment Details Patient Name: Date of Service: [facility], Erik Fairgro[place] B. 02/07/2020 1:15 PM Medical Record Number: 04/08/2020 Patient Account Number: [id number] Date of Birth/Sex: Treating RN: Dec 07, 1965 (54 y.o. 53 Primary Care Deeanna Beightol: [name] Other Clinician: Referring Danaya Geddis: Treating Jabri Blancett/Extender: Ananias Pilgrim, [name] in Treatment: 0 Edema Assessment Assessed: Alta Corning: No] [Right: No] Edema: [Left: N] [Right: o] Vascular Assessment Pulses: Dorsalis Pedis Palpable: [Left:Yes] Blood Pressure: Brachial: [Left:170] Dorsalis Pedis: 194 Ankle: [Left:Posterior Tibial: 180 1.14] Electronic Signature(s) Signed: 02/07/2020 5:29:26 PM By: Cherylin Mylar RN, BSN Entered By: Cherylin Mylar on 02/07/2020 13:56:20 -------------------------------------------------------------------------------- Multi Wound Chart Details Patient Name: Date of Service: [facility], Erik [name]  02/07/2020 1:15 PM Medical Record Number: 04/08/2020 Patient Account Number: [id number] Date of Birth/Sex: Treating RN: Oct 08, 1965 (53 y.o. 51) [name] Primary Care Natesha Hassey: Ananias Pilgrim Other Clinician: Referring Nevada Kirchner: Treating Daiwik Buffalo/Extender: [name], [name] in Treatment: 0 Vital Signs Height(in): 76 Capillary Blood Glucose(mg/dl): Kyra Searles Weight(lbs): 04/08/2020 Pulse(bpm): 85 Body Mass Index(BMI): 32 Blood Pressure(mmHg): 170/85 Temperature(F): 99.1 Respiratory Rate(breaths/min): 18 Photos: [4:No Photos Left, Lateral Foot] [N/A:N/A N/A] Wound Location: [4:Surgical Injury] [N/A:N/A] Wounding Event: [4:Diabetic Wound/Ulcer of the Lower] [N/A:N/A] Primary Etiology: [4:Extremity Cataracts, Hypertension, Type I] [N/A:N/A] Comorbid History: [4:Diabetes, Osteomyelitis, Neuropathy 12/09/2019] [N/A:N/A] Date Acquired: [4:0] [N/A:N/A] Weeks of Treatment: [4:Open] [N/A:N/A]  Wound Status: [4:1.3x0.3x0.6] [N/A:N/A] Measurements L x W x D (cm) [4:0.306] [N/A:N/A] A (cm) : rea [4:0.184] [N/A:N/A] Volume (cm) : [4:3] Starting Position 1 (o'clock): [4:9] Ending Position 1 (o'clock): [4:0.3] Maximum Distance 1 (cm): [4:Yes] [N/A:N/A] Undermining: [4:Grade 2] [N/A:N/A] Classification: [4:Small] [N/A:N/A] Exudate A mount: [4:Serosanguineous] [N/A:N/A] Exudate Type: [4:red, brown] [N/A:N/A] Exudate Color: [4:Well defined, not attached] [N/A:N/A] Wound Margin: [4:Large (67-100%)] [N/A:N/A] Granulation A mount: [4:Red] [N/A:N/A] Granulation Quality: [4:None Present (0%)] [N/A:N/A] Necrotic A mount: [4:Fat Layer (Subcutaneous Tissue): Yes N/A] Exposed Structures: [4:Fascia: No Tendon: No Muscle: No Joint: No Bone: No Small (1-33%)] [N/A:N/A] Treatment Notes Wound #4 (Left, Lateral Foot) 1. Cleanse With Wound Cleanser 2. Periwound Care Skin Prep 3. Primary Dressing Applied Endoform 4. Secondary Dressing Dry Gauze Roll Gauze Foam 5. Secured  With Secretary/administratorTape Electronic Signature(s) Signed: 02/07/2020 5:12:55 PM By: Baltazar Najjarobson, Michael MD Signed: 02/08/2020 5:14:09 PM By: Yevonne PaxEpps, Carrie RN Entered By: Baltazar Najjarobson, Michael on 02/07/2020 14:39:25 -------------------------------------------------------------------------------- Multi-Disciplinary Care Plan Details [facility]: [facility] [place] 02/07/2020 1:15 PM Medical Record Number: [id number] Patient Account Number: [id number] Date of Birth/Sex: Treating RN: 01/10/1966 (53 y.o. Melonie FloridaM) Epps, Carrie Primary Care Polo Mcmartin: Ananias PilgrimAsres, Alehegn Other Clinician: Referring Sagan Maselli: Treating Ander Wamser/Extender: Lianne Cureobson, Michael Asres, [name] in Treatment: 0 Active Inactive Wound/Skin Impairment Nursing Diagnoses: Knowledge deficit related to ulceration/compromised skin integrity Goals: Patient/caregiver will verbalize understanding of skin care regimen Date Initiated: 02/07/2020 Target Resolution Date: 03/08/2020 Goal Status: Active Ulcer/skin breakdown will have a volume reduction of 30% by week 4 Date Initiated: 02/07/2020 Target Resolution Date: 03/08/2020 Goal Status: Active Interventions: Assess patient/caregiver ability to obtain necessary supplies Assess patient/caregiver ability to perform ulcer/skin care regimen upon admission and as needed Assess ulceration(s) every visit Notes: Electronic Signature(s) Signed: 02/08/2020 5:14:09 PM By: Yevonne PaxEpps, Carrie RN Entered By: Yevonne PaxEpps, Carrie on 02/07/2020 14:11:50 -------------------------------------------------------------------------------- Pain Assessment Details [facility]: [name] 02/07/2020 1:15 PM Medical Record Number: [id number] Patient Account Number: [id number] Date of Birth/Sex: Treating RN: 01/10/1966 (54 y.o. Erik SchoonerM) Boehlein, Linda Primary Care Amaziah Raisanen: Ananias PilgrimAsres, Alehegn Other Clinician: Referring Joelle Flessner: Treating Rosezetta Balderston/Extender: Lianne Cureobson, Michael Asres, Diona FantiAlehegn Weeks in Treatment:  0 Active Problems Location of Pain Severity and Description of Pain Patient Has Paino No Site Locations Rate the pain. Current Pain Level: 0 Pain Management and Medication Current Pain Management: Electronic Signature(s) Signed: 02/07/2020 5:29:26 PM By: Zenaida DeedBoehlein, Linda RN, BSN Entered By: Zenaida DeedBoehlein, Linda on 02/07/2020 13:48:09 -------------------------------------------------------------------------------- Patient/Caregiver Education Details Patient Name: Date of Service: [facility] Erik Jilda PandaMUEL B. 9/7/2021andnbsp1:15 PM Medical Record Number: [id number] Patient Account Number: [id number] Date of Birth/Gender: Treating RN: 01/10/1966 (53 y.o. Erik PetitM) Yevonne PaxEpps, Carrie Primary Care Physician: Ananias PilgrimAsres, Alehegn Other Clinician: Referring Physician: Treating Physician/Extender: Alta Corningobson, Michael Asres, [name] in Treatment: 0 Education Assessment Education Provided To: Patient Education Topics Provided Wound/Skin Impairment: Methods: Explain/Verbal Responses: State content correctly Electronic Signature(s) Signed: 02/08/2020 5:14:09 PM By: Yevonne PaxEpps, Carrie RN Entered By: Yevonne PaxEpps, Carrie on 02/07/2020 14:12:03 -------------------------------------------------------------------------------- Wound Assessment Details [facility]: [name] 02/07/2020 1:15 PM Medical Record Number: [id number] Patient Account Number: [id number] Date of Birth/Sex: Treating RN: 01/10/1966 (54 y.o. Erik SchoonerM) Boehlein, Linda Primary Care Rozalynn Buege: Ananias PilgrimAsres, Alehegn Other Clinician: Referring Raquell Richer: Treating Keino Placencia/Extender: Lianne Cureobson, Michael Asres, [name] in Treatment: 0 Wound Status Wound Number: 4 Primary Diabetic Wound/Ulcer of the Lower Extremity Etiology: Wound Location: Left, Lateral Foot Wound Status: Open Wounding Event: Surgical Injury Comorbid Cataracts, Hypertension, Type I Diabetes, Osteomyelitis, Date Acquired: 12/09/2019 History: Neuropathy  Weeks Of Treatment:  0 Clustered Wound: No Photos Photo Uploaded By: Benjaman Kindler on 02/08/2020 13:56:17 Wound Measurements Length: (cm) 1.3 Width: (cm) 0.3 Depth: (cm) 0.6 Area: (cm) 0.306 Volume: (cm) 0.184 % Reduction in Area: % Reduction in Volume: Epithelialization: Small (1-33%) Tunneling: No Undermining: Yes Starting Position (o'clock): 3 Ending Position (o'clock): 9 Maximum Distance: (cm) 0.3 Wound Description Classification: Grade 2 Wound Margin: Well defined, not attached Exudate Amount: Small Exudate Type: Serosanguineous Exudate Color: red, brown Foul Odor After Cleansing: No Slough/Fibrino No Wound Bed Granulation Amount: Large (67-100%) Exposed Structure Granulation Quality: Red Fascia Exposed: No Necrotic Amount: None Present (0%) Fat Layer (Subcutaneous Tissue) Exposed: Yes Tendon Exposed: No Muscle Exposed: No Joint Exposed: No Bone Exposed: No Treatment Notes Wound #4 (Left, Lateral Foot) 1. Cleanse With Wound Cleanser 2. Periwound Care Skin Prep 3. Primary Dressing Applied Endoform 4. Secondary Dressing Dry Gauze Roll Gauze Foam 5. Secured With Secretary/administrator) Signed: 02/07/2020 5:29:26 PM By: Zenaida Deed RN, BSN Entered By: Zenaida Deed on 02/07/2020 13:47:07 -------------------------------------------------------------------------------- Vitals Details Patient Name: Date of Service: [facility], Erik [name] 02/07/2020 1:15 PM Medical Record Number: [id number] Patient Account Number: [id number] Date of Birth/Sex: Treating RN: 1966/01/17 (54 y.o. [name] Primary Care Damarie Schoolfield: Ananias Pilgrim Other Clinician: Referring Reni Hausner: Treating Dezmin Kittelson/Extender: [name], [name] in Treatment: 0 Vital Signs Time Taken: 13:32 Temperature (F): 99.1 Height (in): 76 Pulse (bpm): 85 Source: Stated Respiratory Rate (breaths/min): 18 Weight (lbs): 260 Blood Pressure (mmHg): 170/85 Source: Stated Capillary  Blood Glucose (mg/dl): 063 Body Mass Index (BMI): 31.6 Reference Range: 80 - 120 mg / dl Notes glucose per pt report this am Electronic Signature(s) Signed: 02/07/2020 5:29:26 PM By: Zenaida Deed RN, BSN Entered By: Zenaida Deed on 02/07/2020 13:34:15

## 2020-02-08 NOTE — Progress Notes (Signed)
ARMISTEAD, SULT ([id number]) Visit Report for 02/07/2020 HPI Details Patient Name: Date of Service: [facility], [place] 07/10/4130 1:15 PM Medical Record Number: [id number] Patient Account Number: [id number] Date of Birth/Sex: Treating RN: 1965-06-22 (53 y.o. Jerilynn Mages) Carlene Coria Primary Care Provider: [name] Other Clinician: Referring Provider: Treating Provider/Extender: [name], [name] in Treatment: 0 History of Present Illness HPI Description: ADMISSION 05/04/2018 This is a 54 year old man who apparently is a type I diabetic confirmed by appropriate serology testing 6 years ago. He is therefore on insulin. His most recent hemoglobin A1c was 10.7 on 03/27/2018. 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 As a result of a combination of this he is insensate and even has reduced sensation in his upper extremities. His current problem started in early July he noted a blister over the left fourth plantar metatarsal head. He saw [name] his podiatrist on 01/07/2018. At that time he had a wound that measured 2 x 1 x 0.4 cm. An x-ray did not show osteomyelitis. It did not appear that he actually followed up. He was admitted to hospital on 03/25/2018 through 03/30/2018 with his left foot painful and swollen. He underwent a surgical IandD by [name]. I believe his culture at that time showed Enterococcus faecalis and a bone culture showed enterococcus avium. He is been reviewed by [name] of infectious disease although I have not reviewed his note and he has been on 6 weeks worth of ertapenem which should finish sometime apparently on December 9.. MRI suggested possibility of septic arthritis but did not comment specifically on osteomyelitis. A more recent wound culture was negative. The patient has been  referred here for consideration among other things of hyperbaric oxygen versus proceeding with a partial ray amputation. This would involve excision of the fourth metatarsal head. The patient is offloading this and a Darco forefoot off loader. I think he is using iodoform packing. 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 Certainly would be a possibility of a total contact cast. I did not see any arterial studies on him however his peripheral pulses are palpable 05/11/2018; patient started his hyperbarics on Friday but did not dive yesterday for personal issues. He saw [name] on 12//19 he notes that during the hospitalization in October 2019 he had an abscess and underwent a bone biopsy with culture that showed group B strep, enterococcus avium and Enterococcus faecalis. There was Prevotella from the abscess. He was put on ertapenem for 6 weeks which he completed. Per [name] notable for the fact that he is not been treated with ampicillin or amoxicillin. His hemoglobin A1c was noted to be high at 10.7. He is started on ampicillin sulbactam every 6 again I think this is for better coverage for the enterococcus. We started him on silver alginate as there is still purulent looking drainage coming out of the wound and there is indeed exposed bone. The patient has not been unwell 05/18/2018; patient continues with hyperbarics he is tolerating this well and has no complaints. He is on Unasyn every 6. We have been using silver alginate to the area over the fourth plantar metatarsal head. He is a poorly controlled diabetic. There is certainly less purulent drainage than when I first saw this man at the beginning of the month. He only sedimentation rate I see on him  was 40 on 03/26/2018. States his blood sugars are under better control in the 120s to 130 range. Follows with his endocrinologist in [facility] I change his primary dressing to Prisma today attempting to increase the  granulation. After I left the room he called [place] back into look at a area that he noticed on the right foot 2 days ago. Very superficial and in the medial aspect of his high plantar arch [secondary to his neuropathy] he has a custom insert in the shoe on the right foot. 05/24/2018; continues with HBO doing well. He saw infectious disease today and is still on Unasyn every 6 hours. Not making a lot of progress on the left foot with Prisma I changed him to silver alginate strips. The new area on the right midfoot he is putting silver alginate on this as well. 06/01/2018; patient remains on Unasyn. Not making a lot of progress with granulation. I am going to put him in a total contact cast use Prisma for now. He has a high co-pay for an advanced treatment option but I suspect feeling in this wound may come to that 06/04/2018; patient back for the obligatory first total contact cast change. We continue to use Prisma 06/08/2018; the patient is tolerating the cast well although he had some loosening over the tibia which rubbed when he walked. There is no lesion here. We have been using Prisma under the total contact cast. He is in hyperbarics which she is tolerating well 06/15/2018; tolerating total contact cast and hyperbarics well. He continues IV antibiotics and for the underlying osteomyelitis as directed by infectious disease. His wound is making really nice progress 1/21; Patient's wound is down in depth. Using silver collagen. Ab's end this week and Pic line is to be d/ced 1/28; 0.8 cm in depth which is more than last time. We have been using silver collagen under total contact cast. He continues in hyperbarics. His IV antibiotics are finished PICC line has been removed 2/4; surprisingly today the area is actually epithelialized. There is no open area. There is however a divot where the epithelium is gone down to close the wound surface. I am not completely convinced that this is going to remain viable  over time but I think time is the best thing to determine this. There is no evidence of surrounding infection. As of today I do not think a repeat MRI or any other procedure is going to be indicative of whether this is going to maintain a closed state. I am going to continue him in a total contact cast. He will complete HBO. Next week I will allow him perhaps to graduate into a Darco forefoot off loader, then a healing sandal. I will then send him back to podiatry to see if they can fashion an offloading shoe if this area remains closed 2/10; the patient was taken out of the cast today the area is healed. He has a divot however this is fully epithelialized and the epithelialized tissue looks healthy. I have put him into a Darco forefoot off loader. He will complete his last 2 hyperbaric treatments. I will see him next week and I may transition him into a surgical shoe versus his own shoe. He is following up with [name] of podiatry and I think a diabetic shoe with custom inserts could be provided with the signature provided by his endocrinologist ADMISSION 6/22 CLINICAL DATA: Foot ulcer, question of osteomyelitis EXAM: MRI OF THE LEFT FOOT WITHOUT CONTRAST TECHNIQUE: Multiplanar,  multisequence MR imaging of the left was performed. No intravenous contrast was administered. COMPARISON: Radiograph same day FINDINGS: Bones/Joint/Cartilage Small cystic changes are seen at the fifth metatarsal head, likely degenerative changes. No marrow edema, osseous fracture, or periosteal reaction are seen throughout the osseous structures. Small joint effusion seen at the fifth MTP joint. There appears to be chronic cortical irregularity with erosive type change seen at the fourth metatarsal head which is unchanged since June 28, 2019, however new since 2019. Ligaments The Lisfranc ligaments and collateral ligaments appear to be intact. Muscles and Tendons Diffusely increased signal seen throughout  the muscles surrounding the forefoot with fatty atrophy. The flexor and extensor tendons appear to be intact. The plantar fascia is intact. Soft tissues Along the dorsal lateral aspect of the fifth metatarsal shaft there is a focal area superficial ulceration measuring approximately 8 mm in transverse dimension. There is overlying skin thickening and subcutaneous edema seen. No sinus tract or loculated fluid collection is noted. IMPRESSION: 1. Small focal area of ulceration along the dorsal lateral aspect of the fifth metatarsal shaft. No definite evidence of acute osteomyelitis, sinus tract, or abscess. 2. Progressive chronic erosive cortical destruction seen at the fourth metatarsal head since 2019, however unchanged since June 28, 2019, which could be from chronic osteomyelitis or prior injury. 3. Small joint effusion at the fifth MTP joint. Electronically Signed By: Prudencio Pair M.D. On: 08/02/2019 2:71 54 year old type I diabetic who we had in clinic in late 2019 to February 2020 with osteomyelitis of his left fourth metatarsal head and a wound in this area. This eventually healed with IV antibiotics and hyperbaric oxygen. He was relatively well until February of this year when he developed I believe 2 wounds on the left fifth met head 1 plantar and 1 more laterally. The one more laterally has since closed over but he has been left with a fairly persistent wound in the plantar left fifth met head. Has been following with [name] of podiatry. For an extended period of time he used Medihoney. He received doxycycline and April at which time a culture showed E. coli and MSSA. An MRI done on August 02, 2019 [shown above] did not show osteomyelitis but did suggest some ongoing destruction at the fourth metatarsal head. He was switched to silver alginate as the primary dressing in May. Earlier this month he received a 2-week course of Bactrim due to erythema around the wound. He was  referred to Dr. Alfred Levins of infectious disease who did not think anything was infected and recommended stopping antibiotics. The patient said he felt really well has you finished up the Bactrim but then yesterday morning he became febrile again and noted more erythema around the wound. He was referred here for our review of this. His temperature today is 99.3. He denies any other infectious symptoms no cough no upper respiratory, no dysuria etc. He has been using silver alginate and offloading this area with a surgical shoe. 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 He apparently had shoulder surgery in the fall 2020 I did not look this up he apparently has hardware in this area. Recent x-ray done in Dr. Pasty Arch office on 6/7 was negative for osteo- ABIs have always been noncompressible. Is not previously been thought that arterial insufficiency is contributing to this. 6/29; patient returns the clinic. I thought he might be going to  see Dr. Earleen Newport ongoing for this wound but he is back today. He did see Dr. Earleen Newport on 11/22/2019 I believe he agreed with the MRI and apparently was making some arrangements to have this done in [facility] which I am fine with if they can compare with the study of March 2. In any case he returns with a clinic of the wound is not as good. He has been using silver alginate. He is still on another antibiotic that he says Dr. Earleen Newport prescribed 7/8; the patient had his MRI which showed a progressive soft tissue ulceration plantar to the fifth MTP joint with increased soft tissue enhancement throughout the small toe no abscess was noted. There was progressive signal changes in the fifth metatarsal head and fifth proximal phalanx without cortical destruction or definite abnormal T1 signal. These findings are nonspecific could be secondary  to hyperemia or early osteomyelitis. He is on amoxicillin as directed by Dr. Earleen Newport. There were plans to do a bone biopsy and culture done in the OR on Friday however the patient canceled this because of insurance issues [surgical center not in [place]. In any case I have told him that this procedure in my opinion needs to be done. He is using Silvadene cream READMISSION 02/07/2020 This is a patient who had a progressive ulceration over the fifth MTP. We had him here in June and into the early July. He was also followed by [name] of podiatry. The day after we last saw him on 7/9 he went underwent a excision of the fifth MTP to aid in wound healing over this area. Pathology did not show osteomyelitis. Since then he has been following with Dr. Earleen Newport. Notable that earlier this month he presented with worse wound with purulence. CandS showed MSSA and he was treated with doxycycline and Silvadene cream. More recently he has been changed to Augmentin. He is using Betadine to the wound. He has a forefoot offloading shoe at some point in the postoperative. This wound opened in the surgical line. He is here for our review of this. With regards to his diabetes I spent some time on this today. This was diagnosed in his mid 90s he was on pills for a while and then on insulin. When he was here last time he told me that he thought he had had antipancreatic antibodies that showed he was type I if so I do not see this. He followed for a period of time with Dr. Meredith Pel endocrinology at [facility] who I gather is currently independent practice in [facility]. I have looked through her notes as it applies to what is available in care everywhere and she consistently calls him a type II diabetic. If he had other studies of his diabetes I do not see this. I have therefore changed my label of him to type II ABI in our clinic was 1.41 on the left Electronic Signature(s) Signed: 02/07/2020 5:12:55 PM By: [name]  MD Entered By: [name] on 02/07/2020 14:46:27 -------------------------------------------------------------------------------- Physical Exam Details Patient Name: Date of Service: [facility], [place] 01/08/2118 1:15 PM Medical Record Number: [id number] Patient Account Number: [id number] Date of Birth/Sex: Treating RN: [birth year] (53 y.o. Oval Linsey Primary Care Provider: [name] Other Clinician: Referring Provider: Treating Provider/Extender: [name], [name] in Treatment: 0 Constitutional Patient is hypertensive.. Pulse regular and within target range for patient.Marland Kitchen Respirations regular, non-labored and within target range.. Temperature is normal and within the target range for the  patient.Marland Kitchen Appears in no distress. Respiratory work of breathing is normal. Cardiovascular Pedal pulses are palpable. Notes Wound exam; over the area of the lateral fifth metatarsal head small linear surgical site wound this is a clean base. Some overhanging loose skin. There is no evidence of infection. Also noted over the f base of the fifth metatarsal itself there is a blister. This is indicative of ongoing pressure. Electronic Signature(s) Signed: 02/07/2020 5:12:55 PM By: [name] MD Entered By: [name] on 02/07/2020 14:48:27 -------------------------------------------------------------------------------- Physician Orders Details Patient Name: Date of Service: [facility], [place] 01/04/2777 1:15 PM Medical Record Number: [id number] Patient Account Number: [id number] Date of Birth/Sex: Treating RN: 1966-01-13 (53 y.o. Oval Linsey Primary Care Provider: [name] Other Clinician: Referring Provider: Treating Provider/Extender: [name], [name] in Treatment: 0 Verbal / Phone Orders: No Diagnosis Coding Follow-up Appointments Return Appointment in 1 week. Dressing Change Frequency Change Dressing every other  day. Wound Cleansing May shower and wash wound with soap and water. - on days dressing change Primary Wound Dressing Endoform - moisten with normal saline Secondary Dressing Kerlix/Rolled Gauze - secure with tape Dry Gauze Off-Loading Other: - surgical shoe to left foot Electronic Signature(s) Signed: 02/07/2020 5:12:55 PM By: [name] MD Signed: 02/08/2020 5:14:09 PM By: Carlene Coria RN Entered By: Carlene Coria on 02/07/2020 14:20:28 -------------------------------------------------------------------------------- Problem List Details Patient Name: Date of Service: [facility], [place] 09/03/1538 1:15 PM Medical Record Number: [id number] Patient Account Number: [id number] Date of Birth/Sex: Treating RN: [birth year] (53 y.o. Oval Linsey Primary Care Provider: [name] Other Clinician: Referring Provider: Treating Provider/Extender: [name], [name] in Treatment: 0 Active Problems ICD-10 Encounter Code Description Active Date MDM Diagnosis E11.621 Type 2 diabetes mellitus with foot ulcer 02/07/2020 No Yes T81.31XD Disruption of external operation (surgical) wound, not elsewhere classified, 02/07/2020 No Yes subsequent encounter L97.522 Non-pressure chronic ulcer of other part of left foot with fat layer exposed 02/07/2020 No Yes E11.622 Type 2 diabetes mellitus with other skin ulcer 02/07/2020 No Yes Inactive Problems Resolved Problems Electronic Signature(s) Signed: 02/07/2020 5:12:55 PM By: [name] MD Entered By: [name] on 02/07/2020 14:39:15 -------------------------------------------------------------------------------- Progress Note Details Patient Name: Date of Service: [facility], [place] 02/02/2670 1:15 PM Medical Record Number: [id number] Patient Account Number: [id number] Date of Birth/Sex: Treating RN: 1965/12/02 (53 y.o. Oval Linsey Primary Care Provider: [name] Other Clinician: Referring  Provider: Treating Provider/Extender: [name], [name] in Treatment: 0 Subjective History of Present Illness (HPI) ADMISSION 05/04/2018 This is a 54 year old man who apparently is a type I diabetic confirmed by appropriate serology testing 6 years ago. He is therefore on insulin. His most recent hemoglobin A1c was 10.7 on 03/27/2018. 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 As a result of a combination of this he is insensate and even has reduced sensation in his upper extremities. His current problem started in early July he noted a blister over the left fourth plantar metatarsal head. He saw [name] his podiatrist on 01/07/2018. At that time he had a wound that measured 2 x 1 x 0.4 cm. An x-ray did not show osteomyelitis. It did not appear that he actually followed up. He was admitted to hospital on 03/25/2018 through 03/30/2018 with his left foot painful and swollen. He underwent a surgical IandD by [name]. I  believe his culture at that time showed Enterococcus faecalis and a bone culture showed enterococcus avium. He is been reviewed by [name] of infectious disease although I have not reviewed his note and he has been on 6 weeks worth of ertapenem which should finish sometime apparently on December 9.. MRI suggested possibility of septic arthritis but did not comment specifically on osteomyelitis. A more recent wound culture was negative. The patient has been referred here for consideration among other things of hyperbaric oxygen versus proceeding with a partial ray amputation. This would involve excision of the fourth metatarsal head. The patient is offloading this and a Darco forefoot off loader. I think he is using iodoform packing. 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  Certainly would be a possibility of a total contact cast. I did not see any arterial studies on him however his peripheral pulses are palpable 05/11/2018; patient started his hyperbarics on Friday but did not dive yesterday for personal issues. He saw [name] on 12//19 he notes that during the hospitalization in October 2019 he had an abscess and underwent a bone biopsy with culture that showed group B strep, enterococcus avium and Enterococcus faecalis. There was Prevotella from the abscess. He was put on ertapenem for 6 weeks which he completed. Per [name] notable for the fact that he is not been treated with ampicillin or amoxicillin. His hemoglobin A1c was noted to be high at 10.7. He is started on ampicillin sulbactam every 6 again I think this is for better coverage for the enterococcus. We started him on silver alginate as there is still purulent looking drainage coming out of the wound and there is indeed exposed bone. The patient has not been unwell 05/18/2018; patient continues with hyperbarics he is tolerating this well and has no complaints. He is on Unasyn every 6. We have been using silver alginate to the area over the fourth plantar metatarsal head. He is a poorly controlled diabetic. There is certainly less purulent drainage than when I first saw this man at the beginning of the month. He only sedimentation rate I see on him was 40 on 03/26/2018. States his blood sugars are under better control in the 120s to 130 range. Follows with his endocrinologist in [facility] I change his primary dressing to Prisma today attempting to increase the granulation. After I left the room he called [place] back into look at a area that he noticed on the right foot 2 days ago. Very superficial and in the medial aspect of his high plantar arch [secondary to his neuropathy] he has a custom insert in the shoe on the right foot. 05/24/2018; continues with HBO doing well. He saw infectious disease today  and is still on Unasyn every 6 hours. Not making a lot of progress on the left foot with Prisma I changed him to silver alginate strips. The new area on the right midfoot he is putting silver alginate on this as well. 06/01/2018; patient remains on Unasyn. Not making a lot of progress with granulation. I am going to put him in a total contact cast use Prisma for now. He has a high co-pay for an advanced treatment option but I suspect feeling in this wound may come to that 06/04/2018; patient back for the obligatory first total contact cast change. We continue to use Prisma 06/08/2018; the patient is tolerating the cast well although he had some loosening over the tibia which rubbed when he walked. There  is no lesion here. We have been using Prisma under the total contact cast. He is in hyperbarics which she is tolerating well 06/15/2018; tolerating total contact cast and hyperbarics well. He continues IV antibiotics and for the underlying osteomyelitis as directed by infectious disease. His wound is making really nice progress 1/21; Patient's wound is down in depth. Using silver collagen. Ab's end this week and Pic line is to be d/ced 1/28; 0.8 cm in depth which is more than last time. We have been using silver collagen under total contact cast. He continues in hyperbarics. His IV antibiotics are finished PICC line has been removed 2/4; surprisingly today the area is actually epithelialized. There is no open area. There is however a divot where the epithelium is gone down to close the wound surface. I am not completely convinced that this is going to remain viable over time but I think time is the best thing to determine this. There is no evidence of surrounding infection. As of today I do not think a repeat MRI or any other procedure is going to be indicative of whether this is going to maintain a closed state. I am going to continue him in a total contact cast. He will complete HBO. Next week I will  allow him perhaps to graduate into a Darco forefoot off loader, then a healing sandal. I will then send him back to podiatry to see if they can fashion an offloading shoe if this area remains closed 2/10; the patient was taken out of the cast today the area is healed. He has a divot however this is fully epithelialized and the epithelialized tissue looks healthy. I have put him into a Darco forefoot off loader. He will complete his last 2 hyperbaric treatments. I will see him next week and I may transition him into a surgical shoe versus his own shoe. He is following up with [name] of podiatry and I think a diabetic shoe with custom inserts could be provided with the signature provided by his endocrinologist ADMISSION 6/22 CLINICAL DATA: Foot ulcer, question of osteomyelitis EXAM: MRI OF THE LEFT FOOT WITHOUT CONTRAST TECHNIQUE: Multiplanar, multisequence MR imaging of the left was performed. No intravenous contrast was administered. COMPARISON: Radiograph same day FINDINGS: Bones/Joint/Cartilage Small cystic changes are seen at the fifth metatarsal head, likely degenerative changes. No marrow edema, osseous fracture, or periosteal reaction are seen throughout the osseous structures. Small joint effusion seen at the fifth MTP joint. There appears to be chronic cortical irregularity with erosive type change seen at the fourth metatarsal head which is unchanged since June 28, 2019, however new since 2019. Ligaments The Lisfranc ligaments and collateral ligaments appear to be intact. Muscles and Tendons Diffusely increased signal seen throughout the muscles surrounding the forefoot with fatty atrophy. The flexor and extensor tendons appear to be intact. The plantar fascia is intact. Soft tissues Along the dorsal lateral aspect of the fifth metatarsal shaft there is a focal area superficial ulceration measuring approximately 8 mm in transverse dimension. There is overlying skin  thickening and subcutaneous edema seen. No sinus tract or loculated fluid collection is noted. IMPRESSION: 1. Small focal area of ulceration along the dorsal lateral aspect of the fifth metatarsal shaft. No definite evidence of acute osteomyelitis, sinus tract, or abscess. 2. Progressive chronic erosive cortical destruction seen at the fourth metatarsal head since 2019, however unchanged since June 28, 2019, which could be from chronic osteomyelitis or prior injury. 3. Small joint effusion at the fifth  MTP joint. Electronically Signed By: Prudencio Pair M.D. On: 08/02/2019 20:16 54 year old type I diabetic who we had in clinic in late 2019 to February 2020 with osteomyelitis of his left fourth metatarsal head and a wound in this area. This eventually healed with IV antibiotics and hyperbaric oxygen. He was relatively well until February of this year when he developed I believe 2 wounds on the left fifth met head 1 plantar and 1 more laterally. The one more laterally has since closed over but he has been left with a fairly persistent wound in the plantar left fifth met head. Has been following with [name] of podiatry. For an extended period of time he used Medihoney. He received doxycycline and April at which time a culture showed E. coli and MSSA. An MRI done on August 02, 2019 [shown above] did not show osteomyelitis but did suggest some ongoing destruction at the fourth metatarsal head. He was switched to silver alginate as the primary dressing in May. Earlier this month he received a 2-week course of Bactrim due to erythema around the wound. He was referred to Dr. Alfred Levins of infectious disease who did not think anything was infected and recommended stopping antibiotics. The patient said he felt really well has you finished up the Bactrim but then yesterday morning he became febrile again and noted more erythema around the wound. He was referred here for our review of this. His temperature  today is 99.3. He denies any other infectious symptoms no cough no upper respiratory, no dysuria etc. He has been using silver alginate and offloading this area with a surgical shoe. 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 He apparently had shoulder surgery in the fall 2020 I did not look this up he apparently has hardware in this area. Recent x-ray done in Dr. Pasty Arch office on 6/7 was negative for osteo- ABIs have always been noncompressible. Is not previously been thought that arterial insufficiency is contributing to this. 6/29; patient returns the clinic. I thought he might be going to see Dr. Earleen Newport ongoing for this wound but he is back today. He did see Dr. Earleen Newport on 11/22/2019 I believe he agreed with the MRI and apparently was making some arrangements to have this done in [facility] which I am fine with if they can compare with the study of March 2. In any case he returns with a clinic of the wound is not as good. He has been using silver alginate. He is still on another antibiotic that he says Dr. Earleen Newport prescribed 7/8; the patient had his MRI which showed a progressive soft tissue ulceration plantar to the fifth MTP joint with increased soft tissue enhancement throughout the small toe no abscess was noted. There was progressive signal changes in the fifth metatarsal head and fifth proximal phalanx without cortical destruction or definite abnormal T1 signal. These findings are nonspecific could be secondary to hyperemia or early osteomyelitis. He is on amoxicillin as directed by Dr. Earleen Newport. There were plans to do a bone biopsy and culture done in the OR on Friday however the patient canceled this because of insurance issues [surgical center not in [place]. In any case I have told him that this procedure in my opinion needs to be done. He is  using Silvadene cream READMISSION 02/07/2020 This is a patient who had a progressive ulceration over the fifth MTP. We had  him here in June and into the early July. He was also followed by [name] of podiatry. The day after we last saw him on 7/9 he went underwent a excision of the fifth MTP to aid in wound healing over this area. Pathology did not show osteomyelitis. Since then he has been following with Dr. Earleen Newport. Notable that earlier this month he presented with worse wound with purulence. CandS showed MSSA and he was treated with doxycycline and Silvadene cream. More recently he has been changed to Augmentin. He is using Betadine to the wound. He has a forefoot offloading shoe at some point in the postoperative. This wound opened in the surgical line. He is here for our review of this. With regards to his diabetes I spent some time on this today. This was diagnosed in his mid 58s he was on pills for a while and then on insulin. When he was here last time he told me that he thought he had had antipancreatic antibodies that showed he was type I if so I do not see this. He followed for a period of time with Dr. Meredith Pel endocrinology at [facility] who I gather is currently independent practice in [facility]. I have looked through her notes as it applies to what is available in care everywhere and she consistently calls him a type II diabetic. If he had other studies of his diabetes I do not see this. I have therefore changed my label of him to type II ABI in our clinic was 1.41 on the left Patient History Information obtained from Patient. Allergies lithium (Severity: Moderate, Reaction: intolerance- uncomfortable feeling), Adderall (Reaction: palpitations), Ritalin (Reaction: palpitations) Family History Cancer - Mother, Diabetes - Maternal Grandparents,Father, Heart Disease - Father, No family history of Hereditary Spherocytosis, Hypertension, Kidney Disease, Lung Disease, Seizures, Stroke,  Thyroid Problems, Tuberculosis. Social History Former smoker - quit 30 years ago, Marital Status - Divorced, Alcohol Use - Rarely, Drug Use - No History, Caffeine Use - Daily - coffee. Medical History Eyes Patient has history of Cataracts - right eye removed Denies history of Glaucoma, Optic Neuritis Ear/Nose/Mouth/Throat Denies history of Chronic sinus problems/congestion, Middle ear problems Hematologic/Lymphatic Denies history of Anemia, Hemophilia, Human Immunodeficiency Virus, Lymphedema, Sickle Cell Disease Respiratory Denies history of Aspiration, Asthma, Chronic Obstructive Pulmonary Disease (COPD), Pneumothorax, Sleep Apnea, Tuberculosis Cardiovascular Patient has history of Hypertension Denies history of Angina, Arrhythmia, Congestive Heart Failure, Coronary Artery Disease, Deep Vein Thrombosis, Hypotension, Myocardial Infarction, Peripheral Arterial Disease, Peripheral Venous Disease, Phlebitis, Vasculitis Gastrointestinal Denies history of Cirrhosis , Colitis, Crohnoos, Hepatitis A, Hepatitis B, Hepatitis C Endocrine Patient has history of Type I Diabetes - 6 years Denies history of Type II Diabetes Genitourinary Denies history of End Stage Renal Disease Immunological Denies history of Lupus Erythematosus, Raynaudoos, Scleroderma Integumentary (Skin) Denies history of History of Burn Musculoskeletal Patient has history of Osteomyelitis - right ankle Denies history of Gout, Rheumatoid Arthritis, Osteoarthritis Neurologic Patient has history of Neuropathy Denies history of Dementia, Quadriplegia, Paraplegia, Seizure Disorder Oncologic Denies history of Received Chemotherapy, Received Radiation Psychiatric Denies history of Anorexia/bulimia, Confinement Anxiety Hospitalization/Surgery History - IandD with bone biopsy. - rhinoplasty. - bone spur removed right elbow. - cervical C4-C5 sx. - right shoulder surgery. - left hand surgery. Medical A Surgical History  Notes nd Eyes detach retina left eye in 2005 resulting in wearing a contact. 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 Eyes Complains or has symptoms of Glasses / Contacts - contact left eye. Ear/Nose/Mouth/Throat Denies complaints or symptoms of Chronic sinus problems or rhinitis. Respiratory Denies complaints or symptoms of Chronic or frequent coughs, Shortness of Breath. Gastrointestinal Denies complaints or symptoms of Frequent diarrhea, Nausea, Vomiting. Endocrine Denies complaints or symptoms of Heat/cold intolerance. Genitourinary Denies complaints or symptoms of Frequent urination. Integumentary (Skin) Complains or has symptoms of Wounds - left lateral foot. Musculoskeletal Denies complaints or symptoms of Muscle Pain, Muscle Weakness. Neurologic Complains or has symptoms of Numbness/parasthesias. Psychiatric Denies complaints or symptoms of Claustrophobia, Suicidal. Objective Constitutional Patient is hypertensive.. Pulse regular and within target range for patient.Marland Kitchen Respirations regular, non-labored and within target range.. Temperature is normal and within the target range for the patient.Marland Kitchen Appears in no distress. Vitals Time Taken: 1:32 PM, Height: 76 in, Source: Stated, Weight: 260 lbs, Source: Stated, BMI: 31.6, Temperature: 99.1 F, Pulse: 85 bpm, Respiratory Rate: 18 breaths/min, Blood Pressure: 170/85 mmHg, Capillary Blood Glucose: 152 mg/dl. General Notes: glucose per pt report this am Respiratory work of breathing is normal. Cardiovascular Pedal pulses are palpable. General Notes: Wound exam; over the area of the lateral fifth metatarsal head small linear surgical site wound this is a clean base. Some  overhanging loose skin. There is no evidence of infection. Also noted over the f base of the fifth metatarsal itself there is a blister. This is indicative of ongoing pressure. Integumentary (Hair, Skin) Wound #4 status is Open. Original cause of wound was Surgical Injury. The wound is located on the Left,Lateral Foot. The wound measures 1.3cm length x 0.3cm width x 0.6cm depth; 0.306cm^2 area and 0.184cm^3 volume. There is Fat Layer (Subcutaneous Tissue) exposed. There is no tunneling noted, however, there is undermining starting at 3:00 and ending at 9:00 with a maximum distance of 0.3cm. There is a small amount of serosanguineous drainage noted. The wound margin is well defined and not attached to the wound base. There is large (67-100%) red granulation within the wound bed. There is no necrotic tissue within the wound bed. Assessment Active Problems ICD-10 Type 2 diabetes mellitus with foot ulcer Disruption of external operation (surgical) wound, not elsewhere classified, subsequent encounter Non-pressure chronic ulcer of other part of left foot with fat layer exposed Type 2 diabetes mellitus with other skin ulcer Plan Follow-up Appointments: Return Appointment in 1 week. Dressing Change Frequency: Change Dressing every other day. Wound Cleansing: May shower and wash wound with soap and water. - on days dressing change Primary Wound Dressing: Endoform - moisten with normal saline Secondary Dressing: Kerlix/Rolled Gauze - secure with tape Dry Gauze Off-Loading: Other: - surgical shoe to left foot 1. I am going to use endoform as the primary dressing to see if I can get the small linear wound to close over. 2. The blister on the lateral part of the foot at the base of the fifth metatarsal is indicative to me that I think he inverts at the ankle and walks on the outside of his foot. 3. He has diabetic neuropathy complicating the situation 4. I see no need for ongoing antibiotics at  this point. Although there was initially concern about osteomyelitis based on an MRI. His pathology at the time of surgery on July 9 was negative. 5. If this wound does not heal I think he might benefit from a total contact cast Electronic Signature(s) Signed: 02/07/2020 5:12:55 PM By:  Baltazar Najjar MD Entered By: Baltazar Najjar on 02/07/2020 14:50:00 -------------------------------------------------------------------------------- HxROS Details Patient Name: Date of Service: [facility], [name] 02/07/2020 1:15 PM Medical Record Number: [id number] Patient Account Number: [id number] Date of Birth/Sex: Treating RN: December 07, 1965 (54 y.o. [name] Primary Care Provider: Ananias Pilgrim Other Clinician: Referring Provider: Treating Provider/Extender: Alta Corning in Treatment: 0 Information Obtained From Patient Constitutional Symptoms (General Health) Complaints and Symptoms: Negative for: Fatigue; Fever; Chills; Marked Weight Change Eyes Complaints and Symptoms: Positive for: Glasses / Contacts - contact left eye Medical History: Positive for: Cataracts - right eye removed Negative for: Glaucoma; Optic Neuritis Past Medical History Notes: detach retina left eye in 2005 resulting in wearing a contact. Ear/Nose/Mouth/Throat Complaints and Symptoms: Negative for: Chronic sinus problems or rhinitis Medical History: Negative for: Chronic sinus problems/congestion; Middle ear problems Respiratory Complaints and Symptoms: Negative for: Chronic or frequent coughs; Shortness of Breath Medical History: Negative for: Aspiration; Asthma; Chronic Obstructive Pulmonary Disease (COPD); Pneumothorax; Sleep Apnea; Tuberculosis Gastrointestinal Complaints and Symptoms: Negative for: Frequent diarrhea; Nausea; Vomiting Medical History: Negative for: Cirrhosis ; Colitis; Crohns; Hepatitis A; Hepatitis B; Hepatitis C Past Medical History Notes: hx pancreatitis, hx  stomach ulcer Endocrine Complaints and Symptoms: Negative for: Heat/cold intolerance Medical History: Positive for: Type I Diabetes - 6 years Negative for: Type II Diabetes Time with diabetes: 9 years Treated with: Insulin Blood sugar tested every day: Yes Tested : 4-5 times per day Genitourinary Complaints and Symptoms: Negative for: Frequent urination Medical History: Negative for: End Stage Renal Disease Integumentary (Skin) Complaints and Symptoms: Positive for: Wounds - left lateral foot Medical History: Negative for: History of Burn Musculoskeletal Complaints and Symptoms: Negative for: Muscle Pain; Muscle Weakness Medical History: Positive for: Osteomyelitis - right ankle Negative for: Gout; Rheumatoid Arthritis; Osteoarthritis Neurologic Complaints and Symptoms: Positive for: Numbness/parasthesias Medical History: Positive for: Neuropathy Negative for: Dementia; Quadriplegia; Paraplegia; Seizure Disorder Past Medical History Notes: 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 Psychiatric Complaints and Symptoms: Negative for: Claustrophobia; Suicidal Medical History: Negative for: Anorexia/bulimia; Confinement Anxiety Past Medical History Notes: ADHD Hematologic/Lymphatic Medical History: Negative for: Anemia; Hemophilia; Human Immunodeficiency Virus; Lymphedema; Sickle Cell Disease Cardiovascular Medical History: Positive for: Hypertension Negative for: Angina; Arrhythmia; Congestive Heart Failure; Coronary Artery Disease; Deep Vein Thrombosis; Hypotension; Myocardial Infarction; Peripheral Arterial Disease; Peripheral Venous Disease; Phlebitis; Vasculitis Past Medical History Notes: hyperlipidemia Immunological Medical History: Negative for: Lupus Erythematosus; Raynauds; Scleroderma Oncologic Medical History: Negative for: Received Chemotherapy; Received Radiation HBO Extended History Items Eyes: Cataracts Immunizations Pneumococcal  Vaccine: Received Pneumococcal Vaccination: No Implantable Devices None Hospitalization / Surgery History Type of Hospitalization/Surgery IandD with bone biopsy rhinoplasty bone spur removed right elbow cervical C4-C5 sx right shoulder surgery left hand surgery Family and Social History Cancer: Yes - Mother; Diabetes: Yes - Maternal Grandparents,Father; Heart Disease: Yes - Father; Hereditary Spherocytosis: No; Hypertension: No; Kidney Disease: No; Lung Disease: No; Seizures: No; Stroke: No; Thyroid Problems: No; Tuberculosis: No; Former smoker - quit 30 years ago; Marital Status - Divorced; Alcohol Use: Rarely; Drug Use: No History; Caffeine Use: Daily - coffee; Financial Concerns: No; Food, Clothing or Shelter Needs: No; Support System Lacking: No; Transportation Concerns: No Electronic Signature(s) Signed: 02/07/2020 5:12:55 PM By: Baltazar Najjar MD Signed: 02/07/2020 5:29:26 PM By: Zenaida Deed RN, BSN Entered By: Zenaida Deed on 02/07/2020 13:39:22 -------------------------------------------------------------------------------- SuperBill Details Patient Name: Date of Service: [facility], [name] 02/07/2020 Medical Record Number: [id number] Patient Account Number: [id number] Date of Birth/Sex:  Treating RN: December 26, 1965 (53 y.o. Oval Linsey Primary Care Provider: [name] Other Clinician: Referring Provider: Treating Provider/Extender: [name], [name] in Treatment: 0 Diagnosis Coding ICD-10 Codes Code Description [phone] Type 2 diabetes mellitus with foot ulcer T81.31XD Disruption of external operation (surgical) wound, not elsewhere classified, subsequent encounter L97.522 Non-pressure chronic ulcer of other part of left foot with fat layer exposed E11.622 Type 2 diabetes mellitus with other skin ulcer Facility Procedures CPT4 Code: [id number] Description: 99214 - WOUND CARE VISIT-LEV 4 EST PT Modifier: Quantity: 1 Physician  Procedures : CPT4 Code Description Modifier [id number] 99214 - WC PHYS LEVEL 4 - EST PT ICD-10 Diagnosis Description E11.621 Type 2 diabetes mellitus with foot ulcer T81.31XD Disruption of external operation (surgical) wound, not elsewhere classified, subsequent  encounter L97.522 Non-pressure chronic ulcer of other part of left foot with fat layer exposed Quantity: 1 Electronic Signature(s) Signed: 02/07/2020 5:12:55 PM By: [name] MD Signed: 02/08/2020 5:14:09 PM By: Carlene Coria RN Entered By: Carlene Coria on 02/07/2020 15:38:00

## 2020-02-09 ENCOUNTER — Encounter: Payer: BLUE CROSS/BLUE SHIELD | Admitting: Podiatry

## 2020-02-09 NOTE — Progress Notes (Signed)
Subjective: [name] is a 54 y.o. is seen today in office s/p left foot wound excision, fifth metatarsal head excision preformed on 12/09/2019.  Presents today for suture removal.  Otherwise he states that he has been doing well.  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  He has a follow-up to see wound care on Tuesday.  Currently denies any fevers, chills, nausea, vomiting.  No calf pain, chest pain, shortness of breath.  Objective: General: No acute distress, AAOx3  DP/PT pulses palpable 2/4, CRT < 3 sec to all digits.  Right foot: Incision is well coapted without any evidence of dehiscence with sutures intact with a small opening on the central aspect which probes approximately 0.5 cm.  Mild but improved swelling to the foot but there is no significant warmth.   Superficial abrasion on the leg with improvement No other areas of tenderness to bilateral lower extremities.  No other open lesions or pre-ulcerative lesions.  No pain with calf compression, swelling, warmth, erythema.   Assessment and Plan:  Status post left foot surgery  -Treatment options discussed including all alternatives, risks, and complications -X-rays obtained reviewed.  No evidence of acute fracture, osteomyelitis.  No soft tissue edema. -Remove the sutures today.  Steri-Strips applied for reinforcement for the wound appears to be healing although he does have follow-up with wound care center on Tuesday.  Continue daily dressing changes for now.  Continue offloading dressing on the foot is much as possible. -Monitor for any clinical signs or symptoms of infection and directed to call the office immediately should any occur or go to the ER.  Vivi Barrack DPM

## 2020-02-13 ENCOUNTER — Encounter (HOSPITAL_BASED_OUTPATIENT_CLINIC_OR_DEPARTMENT_OTHER): Payer: BLUE CROSS/BLUE SHIELD | Admitting: Internal Medicine

## 2020-02-13 ENCOUNTER — Other Ambulatory Visit: Payer: Self-pay

## 2020-02-13 DIAGNOSIS — E10622 Type 1 diabetes mellitus with other skin ulcer: Secondary | ICD-10-CM | POA: Diagnosis not present

## 2020-02-15 NOTE — Progress Notes (Signed)
[name], [name] ([id number]) Visit Report for 02/13/2020 Arrival Information Details Patient Name: Date of Service: [facility], [place] 02/13/2020 2:00 PM Medical Record Number: [id number] Patient Account Number: [id number] Date of Birth/Sex: Treating RN: 1966-04-29 (54 y.o. [name] Primary Care [name]: [name] Other Clinician: Referring [name]: Treating [name]/Extender: [name], [name] in Treatment: 0 Visit Information History Since Last Visit Added or deleted any medications: No Patient Arrived: Knee Scooter Any new allergies or adverse reactions: No Arrival Time: 14:51 Had a fall or experienced change in No Accompanied By: self activities of daily living that may affect Transfer Assistance: None risk of falls: Patient Identification Verified: Yes Signs or symptoms of abuse/neglect since last visito No Secondary Verification Process Completed: Yes Hospitalized since last visit: No Patient Requires Transmission-Based Precautions: No Implantable device outside of the clinic excluding No Patient Has Alerts: No cellular tissue based products placed in the center since last visit: Has Dressing in Place as Prescribed: Yes Pain Present Now: No Electronic Signature(s) Signed: 02/13/2020 6:04:50 PM By: [name] Entered By: [name] on 02/13/2020 14:52:00 -------------------------------------------------------------------------------- Encounter Discharge Information Details Patient Name: Date of Service: [facility], Erik Erik B. 02/13/2020 2:00 PM Medical Record Number: [id number] Patient Account Number: [id number] Date of Birth/Sex: Treating RN: 01-03-1966 (54 y.o. [name] Primary Care Nakira Litzau: [name] Other Clinician: Referring Yahir Tavano: Treating [name]/Extender: [name], [name] in Treatment: 0 Encounter Discharge Information Items Post Procedure Vitals Discharge Condition:  Stable Temperature (F): 99.2 Ambulatory Status: Walker Pulse (bpm): 84 Discharge Destination: Home Respiratory Rate (breaths/min): 18 Transportation: Private Auto Blood Pressure (mmHg): 155/80 Accompanied By: self Schedule Follow-up Appointment: Yes Clinical Summary of Care: Electronic Signature(s) Signed: 02/13/2020 5:15:56 PM By: [name] Entered By: [name] on 02/13/2020 16:18:19 -------------------------------------------------------------------------------- Lower Extremity Assessment Details Patient Name: Date of Service: [facility] [place] Erik B. 02/13/2020 2:00 PM Medical Record Number: [id number] Patient Account Number: [id number] Date of Birth/Sex: Treating RN: 13-Apr-1966 (54 y.o. [name] Primary Care Noreen Mackintosh: [name] Other Clinician: Referring Genean Adamski: Treating Lafonda Patron/Extender: [name], [name] in Treatment: 0 Edema Assessment Assessed: [name]: No] [name]: No] Edema: [Left: N] [[name]: o] Calf Left: [name]: Point of Measurement: cm From Medial Instep 37 cm cm Ankle Left: [name]: Point of Measurement: cm From Medial Instep 20.5 cm cm Vascular Assessment Pulses: Dorsalis Pedis Palpable: [Left:Yes] Electronic Signature(s) Signed: 02/13/2020 6:04:50 PM By: [name] Entered By: [name] on 02/13/2020 14:53:26 -------------------------------------------------------------------------------- Multi Wound Chart Details Patient Name: Date of Service: [facility], Erik Erik B. 02/13/2020 2:00 PM Medical Record Number: [id number] Patient Account Number: [id number] Date of Birth/Sex: Treating RN: 28-Apr-1966 (54 y.o. [name] Primary Care Tarius Stangelo: [name] Other Clinician: Referring Contrell Ballentine: Treating Jakhiya Brower/Extender: [name], [name] in Treatment: 0 Vital Signs Height(in): 76 Capillary Blood Glucose(mg/dl): 128 Weight(lbs): 118 Pulse(bpm): 84 Body Mass Index(BMI):  32 Blood Pressure(mmHg): 155/80 Temperature(F): 99.2 Respiratory Rate(breaths/min): 18 Photos: [4:No Photos Left, Lateral Foot] [N/A:N/A N/A] Wound Location: [4:Surgical Injury] [N/A:N/A] Wounding Event: [4:Diabetic Wound/Ulcer of the Lower] [N/A:N/A] Primary Etiology: [4:Extremity Cataracts, Hypertension, Type I] [N/A:N/A] Comorbid History: [4:Diabetes, Osteomyelitis, Neuropathy 12/09/2019] [N/A:N/A] Date Acquired: [4:0] [N/A:N/A] [name] of Treatment: [4:Open] [N/A:N/A] Wound Status: [4:2x0.9x0.7] [N/A:N/A] Measurements L x W x D (cm) [4:1.414] [N/A:N/A] A (cm) : rea [4:0.99] [N/A:N/A] Volume (cm) : [4:-362.10%] [N/A:N/A] % Reduction in A rea: [4:-438.00%] [N/A:N/A] % Reduction in Volume: [4:Grade 2] [N/A:N/A] Classification: [4:Small] [N/A:N/A] Exudate A mount: [4:Purulent] [N/A:N/A] Exudate Type: [4:yellow, brown,  green] [N/A:N/A] Exudate Color: [4:Well defined, not attached] [N/A:N/A] Wound Margin: [4:Small (1-33%)] [N/A:N/A] Granulation A mount: [4:Red] [N/A:N/A] Granulation Quality: [4:Large (67-100%)] [N/A:N/A] Necrotic A mount: [4:Eschar, Adherent Slough] [N/A:N/A] Necrotic Tissue: [4:Fat Layer (Subcutaneous Tissue): Yes N/A] Exposed Structures: [4:Fascia: No Tendon: No Muscle: No Joint: No Bone: No Small (1-33%)] [N/A:N/A] Epithelialization: [4:Debridement - Excisional] [N/A:N/A] Debridement: Pre-procedure Verification/Time Out 15:55 [N/A:N/A] Taken: [4:Subcutaneous] [N/A:N/A] Tissue Debrided: [4:Skin/Subcutaneous Tissue] [N/A:N/A] Level: [4:1.8] [N/A:N/A] Debridement A (sq cm): [4:rea Curette] [N/A:N/A] Instrument: [4:Swab] [N/A:N/A] Specimen: [4:1] [N/A:N/A] Number of Specimens Taken: [4:Moderate] [N/A:N/A] Bleeding: [4:Pressure] [N/A:N/A] Hemostasis A chieved: [4:0] [N/A:N/A] Procedural Pain: [4:0] [N/A:N/A] Post Procedural Pain: [4:Procedure was tolerated well] [N/A:N/A] Debridement Treatment Response: [4:2x0.9x0.7] [N/A:N/A] Post Debridement Measurements L  x W x D (cm) [4:0.99] [N/A:N/A] Post Debridement Volume: (cm) [4:Debridement] [N/A:N/A] Treatment Notes Wound #4 (Left, Lateral Foot) 1. Cleanse With Wound Cleanser 3. Primary Dressing Applied Endoform 4. Secondary Dressing Dry Gauze Roll Gauze Foam 5. Secured With Medipore tape 7. Footwear/Offloading device applied Surgical shoe Notes knee walker Electronic Signature(s) Signed: 02/14/2020 5:12:45 PM By: Baltazar Najjar MD Signed: 02/15/2020 5:57:30 PM By: Zandra Abts RN, BSN Entered By: Baltazar Najjar on 02/14/2020 07:37:10 -------------------------------------------------------------------------------- Multi-Disciplinary Care Plan Details Patient Name: Date of Service: [facility], Erik Erik B. 02/13/2020 2:00 PM Medical Record Number: [id number] Patient Account Number: [id number] Date of Birth/Sex: Treating RN: 11/12/1965 (54 y.o. [name] Primary Care Cash Meadow: [name] Other Clinician: Referring Tavi Gaughran: Treating Terasa Orsini/Extender: [name], [name] in Treatment: 0 Active Inactive Wound/Skin Impairment Nursing Diagnoses: Knowledge deficit related to ulceration/compromised skin integrity Goals: Patient/caregiver will verbalize understanding of skin care regimen Date Initiated: 02/07/2020 Target Resolution Date: 03/08/2020 Goal Status: Active Ulcer/skin breakdown will have a volume reduction of 30% by week 4 Date Initiated: 02/07/2020 Target Resolution Date: 03/08/2020 Goal Status: Active Interventions: Assess patient/caregiver ability to obtain necessary supplies Assess patient/caregiver ability to perform ulcer/skin care regimen upon admission and as needed Assess ulceration(s) every visit Notes: Electronic Signature(s) Signed: 02/15/2020 5:57:30 PM By: Zandra Abts RN, BSN Entered By: Zandra Abts on 02/13/2020 15:50:29 -------------------------------------------------------------------------------- Pain Assessment  Details Patient Name: Date of Service: [facility], Erik Erik B. 02/13/2020 2:00 PM Medical Record Number: [id number] Patient Account Number: [id number] Date of Birth/Sex: Treating RN: 1966/02/04 (54 y.o. [name] Primary Care Leston Schueller: [name] Other Clinician: Referring Ethelyne Erich: Treating Albie Arizpe/Extender: [name], [name] in Treatment: 0 Active Problems Location of Pain Severity and Description of Pain Patient Has Paino No Site Locations Pain Management and Medication Current Pain Management: Electronic Signature(s) Signed: 02/13/2020 6:04:50 PM By: [name] Entered By: [name] on 02/13/2020 14:52:41 -------------------------------------------------------------------------------- Patient/Caregiver Education Details Patient Name: Date of Service: [facility], Erik Jilda Panda 9/13/2021andnbsp2:00 PM Medical Record Number: [id number] Patient Account Number: [id number] Date of Birth/Gender: Treating RN: Jun 04, 1965 (54 y.o. [name] Primary Care Physician: [name] Other Clinician: Referring Physician: Treating Physician/Extender: Alta Corning in Treatment: 0 Education Assessment Education Provided To: Patient Education Topics Provided Wound/Skin Impairment: Methods: Explain/Verbal Responses: State content correctly Electronic Signature(s) Signed: 02/15/2020 5:57:30 PM By: Zandra Abts RN, BSN Entered By: Zandra Abts on 02/13/2020 15:50:42 -------------------------------------------------------------------------------- Wound Assessment Details Patient Name: Date of Service: [facility], Erik Erik B. 02/13/2020 2:00 PM Medical Record Number: [id number] Patient Account Number: [id number] Date of Birth/Sex: Treating RN: 1965-08-19 (54 y.o. [name] Primary Care Curry Seefeldt: [name] Other Clinician: Referring Mikaylah Libbey: Treating Kijana Cromie/Extender: [name], [name] in Treatment: 0 Wound Status Wound  Number: 4 Primary Diabetic Wound/Ulcer of the Lower Extremity Etiology: Wound Location: Left, Lateral Foot Wound Status: Open Wounding Event: Surgical Injury Comorbid Cataracts, Hypertension, Type I Diabetes, Osteomyelitis, Date Acquired: 12/09/2019 History: Neuropathy [name] Of Treatment: 0 Clustered Wound: No Photos Photo Uploaded By: Benjaman Kindler on 02/14/2020 14:04:19 Wound Measurements Length: (cm) 2 Width: (cm) 0.9 Depth: (cm) 0.7 Area: (cm) 1.414 Volume: (cm) 0.99 % Reduction in Area: -362.1% % Reduction in Volume: -438% Epithelialization: Small (1-33%) Tunneling: No Undermining: No Wound Description Classification: Grade 2 Wound Margin: Well defined, not attached Exudate Amount: Small Exudate Type: Purulent Exudate Color: yellow, brown, green Foul Odor After Cleansing: No Slough/Fibrino Yes Wound Bed Granulation Amount: Small (1-33%) Exposed Structure Granulation Quality: Red Fascia Exposed: No Necrotic Amount: Large (67-100%) Fat Layer (Subcutaneous Tissue) Exposed: Yes Necrotic Quality: Eschar, Adherent Slough Tendon Exposed: No Muscle Exposed: No Joint Exposed: No Bone Exposed: No Treatment Notes Wound #4 (Left, Lateral Foot) 1. Cleanse With Wound Cleanser 3. Primary Dressing Applied Endoform 4. Secondary Dressing Dry Gauze Roll Gauze Foam 5. Secured With Medipore tape 7. Footwear/Offloading device applied Surgical shoe Notes knee walker Electronic Signature(s) Signed: 02/13/2020 6:04:50 PM By: [name] Entered By: [name] on 02/13/2020 14:59:36 -------------------------------------------------------------------------------- Vitals Details Patient Name: Date of Service: [facility], Erik Erik B. 02/13/2020 2:00 PM Medical Record Number: [id number] Patient Account Number: [id number] Date of Birth/Sex: Treating RN: 1965-09-30 (54 y.o. [name] Primary Care Venetia Prewitt: Other Clinician: Ananias [name] Referring Nakeysha Pasqual: Treating Davonta Stroot/Extender: [name], [name] in Treatment: 0 Vital Signs Time Taken: 14:50 Temperature (F): 99.2 Height (in): 76 Pulse (bpm): 84 Weight (lbs): 260 Respiratory Rate (breaths/min): 18 Body Mass Index (BMI): 31.6 Blood Pressure (mmHg): 155/80 Capillary Blood Glucose (mg/dl): 353 Reference Range: 80 - 120 mg / dl Notes patient stated CBG was 152 this morning Electronic Signature(s) Signed: 02/13/2020 6:04:50 PM By: [name] Entered By: [name] on 02/13/2020 14:52:34

## 2020-02-15 NOTE — Progress Notes (Signed)
[name], SILVIO ([id number]) Visit Report for 02/13/2020 Debridement Details Patient Name: Date of Service: [name], [place] 9/89/2119 2:00 PM Medical Record Number: [id number] Patient Account Number: [id number] Date of Birth/Sex: Treating RN: 06-Jun-1965 (54 y.o. [name] Primary Care Provider: [name] Other Clinician: Referring Provider: Treating Provider/Extender: [name], [name] in Treatment: 0 Debridement Performed for Assessment: Wound #4 Left,Lateral Foot Performed By: Physician [name]., MD Debridement Type: Debridement Severity of Tissue Pre Debridement: Fat layer exposed Level of Consciousness (Pre-procedure): Awake and Alert Pre-procedure Verification/Time Out Yes - 15:55 Taken: Start Time: 15:55 T Area Debrided (L x W): otal 2 (cm) x 0.9 (cm) = 1.8 (cm) Tissue and other material debrided: Viable, Non-Viable, Subcutaneous Level: Skin/Subcutaneous Tissue Debridement Description: Excisional Instrument: Curette Specimen: Swab, Number of Specimens T aken: 1 Bleeding: Moderate Hemostasis Achieved: Pressure End Time: 15:56 Procedural Pain: 0 Post Procedural Pain: 0 Response to Treatment: Procedure was tolerated well Level of Consciousness (Post- Awake and Alert procedure): Post Debridement Measurements of Total Wound Length: (cm) 2 Width: (cm) 0.9 Depth: (cm) 0.7 Volume: (cm) 0.99 Character of Wound/Ulcer Post Debridement: Improved Severity of Tissue Post Debridement: Fat layer exposed Post Procedure Diagnosis Same as Pre-procedure Electronic Signature(s) Signed: 02/14/2020 5:12:45 PM By: [name] MD Signed: 02/15/2020 5:57:30 PM By: [name] RN, BSN Entered By: [name] on 02/14/2020 07:37:57 -------------------------------------------------------------------------------- HPI Details Patient Name: Date of Service: [name], [name] 01/17/5630 2:00 PM Medical Record Number:  [id number] Patient Account Number: [id number] Date of Birth/Sex: Treating RN: Aug 01, 1965 (54 y.o. [name] Primary Care Provider: [name] Other Clinician: Referring Provider: Treating Provider/Extender: [name], [name] in Treatment: 0 History of Present Illness HPI Description: ADMISSION 05/04/2018 This is a 54 year old man who apparently is a type I diabetic confirmed by appropriate serology testing 6 years ago. He is therefore on insulin. His most recent hemoglobin A1c was 10.7 on 03/27/2018. 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 As a result of a combination of this he is insensate and even has reduced sensation in his upper extremities. His current problem started in early July he noted a blister over the left fourth plantar metatarsal head. He saw [name] his podiatrist on 01/07/2018. At that time he had a wound that measured 2 x 1 x 0.4 cm. An x-ray did not show osteomyelitis. It did not appear that he actually followed up. He was admitted to [name] on 03/25/2018 through 03/30/2018 with his left foot painful and swollen. He underwent a surgical IandD by [name]. I believe his culture at that time showed Enterococcus faecalis and a bone culture showed enterococcus avium. He is been reviewed by [name] of infectious disease although I have not reviewed his note and he has been on 6 weeks worth of ertapenem which should finish sometime apparently on December 9.. MRI suggested possibility of septic arthritis but did not comment specifically on osteomyelitis. A more recent wound culture was negative. The patient has been referred here for consideration among other things of hyperbaric oxygen versus proceeding with a partial ray amputation. This would involve excision of the fourth  metatarsal head. The patient is offloading this and a Darco forefoot off loader. I think he is using iodoform packing. 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 Certainly would be a possibility of  a total contact cast. I did not see any arterial studies on him however his peripheral pulses are palpable 05/11/2018; patient started his hyperbarics on Friday but did not dive yesterday for personal issues. He saw [name] on 12//19 he notes that during the hospitalization in October 2019 he had an abscess and underwent a bone biopsy with culture that showed group B strep, enterococcus avium and Enterococcus faecalis. There was Prevotella from the abscess. He was put on ertapenem for 6 weeks which he completed. Per [name] notable for the fact that he is not been treated with ampicillin or amoxicillin. His hemoglobin A1c was noted to be high at 10.7. He is started on ampicillin sulbactam every 6 again I think this is for better coverage for the enterococcus. We started him on silver alginate as there is still purulent looking drainage coming out of the wound and there is indeed exposed bone. The patient has not been unwell 05/18/2018; patient continues with hyperbarics he is tolerating this well and has no complaints. He is on Unasyn every 6. We have been using silver alginate to the area over the fourth plantar metatarsal head. He is a poorly controlled diabetic. There is certainly less purulent drainage than when I first saw this man at the beginning of the month. He only sedimentation rate I see on him was 40 on 03/26/2018. States his blood sugars are under better control in the 120s to 130 range. Follows with his endocrinologist in [facility] I change his primary dressing to Prisma today attempting to increase the granulation. After I left the room he called [place] back into look at a area that he noticed on the right foot 2 days ago. Very superficial and in the medial aspect of  his high plantar arch [secondary to his neuropathy] he has a custom insert in the shoe on the right foot. 05/24/2018; continues with HBO doing well. He saw infectious disease today and is still on Unasyn every 6 hours. Not making a lot of progress on the left foot with Prisma I changed him to silver alginate strips. The new area on the right midfoot he is putting silver alginate on this as well. 06/01/2018; patient remains on Unasyn. Not making a lot of progress with granulation. I am going to put him in a total contact cast use Prisma for now. He has a high co-pay for an advanced treatment option but I suspect feeling in this wound may come to that 06/04/2018; patient back for the obligatory first total contact cast change. We continue to use Prisma 06/08/2018; the patient is tolerating the cast well although he had some loosening over the tibia which rubbed when he walked. There is no lesion here. We have been using Prisma under the total contact cast. He is in hyperbarics which she is tolerating well 06/15/2018; tolerating total contact cast and hyperbarics well. He continues IV antibiotics and for the underlying osteomyelitis as directed by infectious disease. His wound is making really nice progress 1/21; Patient's wound is down in depth. Using silver collagen. Ab's end this week and Pic line is to be d/ced 1/28; 0.8 cm in depth which is more than last time. We have been using silver collagen under total contact cast. He continues in hyperbarics. His IV antibiotics are finished PICC line has been removed 2/4; surprisingly today the area is actually epithelialized. There is no open area. There is however a divot where the epithelium is gone down to close the wound surface.  I am not completely convinced that this is going to remain viable over time but I think time is the best thing to determine this. There is no evidence of surrounding infection. As of today I do not think a repeat MRI or any other  procedure is going to be indicative of whether this is going to maintain a closed state. I am going to continue him in a total contact cast. He will complete HBO. Next week I will allow him perhaps to graduate into a Darco forefoot off loader, then a healing sandal. I will then send him back to podiatry to see if they can fashion an offloading shoe if this area remains closed 2/10; the patient was taken out of the cast today the area is healed. He has a divot however this is fully epithelialized and the epithelialized tissue looks healthy. I have put him into a Darco forefoot off loader. He will complete his last 2 hyperbaric treatments. I will see him next week and I may transition him into a surgical shoe versus his own shoe. He is following up with [name] of podiatry and I think a diabetic shoe with custom inserts could be provided with the signature provided by his endocrinologist ADMISSION 6/22 CLINICAL DATA: Foot ulcer, question of osteomyelitis EXAM: MRI OF THE LEFT FOOT WITHOUT CONTRAST TECHNIQUE: Multiplanar, multisequence MR imaging of the left was performed. No intravenous contrast was administered. COMPARISON: Radiograph same day FINDINGS: Bones/Joint/Cartilage Small cystic changes are seen at the fifth metatarsal head, likely degenerative changes. No marrow edema, osseous fracture, or periosteal reaction are seen throughout the osseous structures. Small joint effusion seen at the fifth MTP joint. There appears to be chronic cortical irregularity with erosive type change seen at the fourth metatarsal head which is unchanged since June 28, 2019, however new since 2019. Ligaments The Lisfranc ligaments and collateral ligaments appear to be intact. Muscles and Tendons Diffusely increased signal seen throughout the muscles surrounding the forefoot with fatty atrophy. The flexor and extensor tendons appear to be intact. The plantar fascia is intact. Soft tissues Along  the dorsal lateral aspect of the fifth metatarsal shaft there is a focal area superficial ulceration measuring approximately 8 mm in transverse dimension. There is overlying skin thickening and subcutaneous edema seen. No sinus tract or loculated fluid collection is noted. IMPRESSION: 1. Small focal area of ulceration along the dorsal lateral aspect of the fifth metatarsal shaft. No definite evidence of acute osteomyelitis, sinus tract, or abscess. 2. Progressive chronic erosive cortical destruction seen at the fourth metatarsal head since 2019, however unchanged since June 28, 2019, which could be from chronic osteomyelitis or prior injury. 3. Small joint effusion at the fifth MTP joint. Electronically Signed By: Prudencio Pair M.D. On: 08/02/2019 58:67 54 year old type I diabetic who we had in clinic in late 2019 to February 2020 with osteomyelitis of his left fourth metatarsal head and a wound in this area. This eventually healed with IV antibiotics and hyperbaric oxygen. He was relatively well until February of this year when he developed I believe 2 wounds on the left fifth met head 1 plantar and 1 more laterally. The one more laterally has since closed over but he has been left with a fairly persistent wound in the plantar left fifth met head. Has been following with [name] of podiatry. For an extended period of time he used Medihoney. He received doxycycline and April at which time a culture showed E. coli and MSSA. An MRI done  on August 02, 2019 [shown above] did not show osteomyelitis but did suggest some ongoing destruction at the fourth metatarsal head. He was switched to silver alginate as the primary dressing in May. Earlier this month he received a 2-week course of Bactrim due to erythema around the wound. He was referred to Dr. Alfred Levins of infectious disease who did not think anything was infected and recommended stopping antibiotics. The patient said he felt really well has  you finished up the Bactrim but then yesterday morning he became febrile again and noted more erythema around the wound. He was referred here for our review of this. His temperature today is 99.3. He denies any other infectious symptoms no cough no upper respiratory, no dysuria etc. He has been using silver alginate and offloading this area with a surgical shoe. 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 He apparently had shoulder surgery in the fall 2020 I did not look this up he apparently has hardware in this area. Recent x-ray done in Dr. Pasty Arch office on 6/7 was negative for osteo- ABIs have always been noncompressible. Is not previously been thought that arterial insufficiency is contributing to this. 6/29; patient returns the clinic. I thought he might be going to see Dr. Earleen Newport ongoing for this wound but he is back today. He did see Dr. Earleen Newport on 11/22/2019 I believe he agreed with the MRI and apparently was making some arrangements to have this done in [facility] which I am fine with if they can compare with the study of March 2. In any case he returns with a clinic of the wound is not as good. He has been using silver alginate. He is still on another antibiotic that he says Dr. Earleen Newport prescribed 7/8; the patient had his MRI which showed a progressive soft tissue ulceration plantar to the fifth MTP joint with increased soft tissue enhancement throughout the small toe no abscess was noted. There was progressive signal changes in the fifth metatarsal head and fifth proximal phalanx without cortical destruction or definite abnormal T1 signal. These findings are nonspecific could be secondary to hyperemia or early osteomyelitis. He is on amoxicillin as directed by Dr. Earleen Newport. There were plans to do a bone biopsy and culture done in the OR on Friday  however the patient canceled this because of insurance issues [surgical center not in [place]. In any case I have told him that this procedure in my opinion needs to be done. He is using Silvadene cream READMISSION 02/07/2020 This is a patient who had a progressive ulceration over the fifth MTP. We had him here in June and into the early July. He was also followed by [name] of podiatry. The day after we last saw him on 7/9 he went underwent a excision of the fifth MTP to aid in wound healing over this area. Pathology did not show osteomyelitis. Since then he has been following with Dr. Earleen Newport. Notable that earlier this month he presented with worse wound with purulence. CandS showed MSSA and he was treated with doxycycline and Silvadene cream. More recently he has been changed to Augmentin. He is using Betadine to the wound. He has a forefoot offloading shoe at some point in the postoperative. This wound opened in the surgical line. He is here for our review of this. With regards to his diabetes I spent some time on this today. This  was diagnosed in his mid 10s he was on pills for a while and then on insulin. When he was here last time he told me that he thought he had had antipancreatic antibodies that showed he was type I if so I do not see this. He followed for a period of time with Dr. Roanna Raider endocrinology at [facility] who I gather is currently independent practice in [facility]. I have looked through her notes as it applies to what is available in care everywhere and she consistently calls him a type II diabetic. If he had other studies of his diabetes I do not see this. I have therefore changed my label of him to type II ABI in our clinic was 1.41 on the left 9/13; arrives in clinic today with things not looking as good. He has a small linear wound in the incision line but nonviable rolled edges and debris around the circumference. As well it looks as though he had a deep tissue injury  over the base of the fifth metatarsal. Raised may be blistered but no open area here. Also concerning erythema around the wound both distally and proximally Electronic Signature(s) Signed: 02/14/2020 5:12:45 PM By: Baltazar Najjar MD Entered By: Baltazar Najjar on 02/14/2020 07:40:01 -------------------------------------------------------------------------------- Physical Exam Details Patient Name: Date of Service: [name], [name] 02/13/2020 2:00 PM Medical Record Number: [id number] Patient Account Number: [id number] Date of Birth/Sex: Treating RN: 1965/06/30 (54 y.o. [name] Primary Care Provider: Ananias Pilgrim Other Clinician: Referring Provider: Treating Provider/Extender: [name], [name] in Treatment: 0 Constitutional Patient is hypertensive.. Pulse regular and within target range for patient.Marland Kitchen Respirations regular, non-labored and within target range.. Temperature is normal and within the target range for the patient.Marland Kitchen Appears in no distress. Notes Wound exam; over the lateral fifth metatarsal head is linear surgical site does not look as healthy as last week. Sides debrided with a #3 curette hemostasis with direct pressure. Sanguinous drainage which I did a PCR culture of the. There is no palpable bone. Erythema both proximally and distally but no palpable tenderness although he has neuropathy. There is no palpable bone Electronic Signature(s) Signed: 02/14/2020 5:12:45 PM By: Baltazar Najjar MD Entered By: Baltazar Najjar on 02/14/2020 07:41:01 -------------------------------------------------------------------------------- Physician Orders Details Patient Name: Date of Service: [facility], [name] 02/13/2020 2:00 PM Medical Record Number: [id number] Patient Account Number: [id number] Date of Birth/Sex: Treating RN: 10-Jun-1965 (54 y.o. [name] Primary Care Provider: Ananias Pilgrim Other Clinician: Referring Provider: Treating  Provider/Extender: [name], Diona Fanti in Treatment: 0 Verbal / Phone Orders: No Diagnosis Coding ICD-10 Coding Code Description [phone] Type 2 diabetes mellitus with foot ulcer T81.31XD Disruption of external operation (surgical) wound, not elsewhere classified, subsequent encounter L97.522 Non-pressure chronic ulcer of other part of left foot with fat layer exposed E11.622 Type 2 diabetes mellitus with other skin ulcer Follow-up Appointments Return Appointment in 1 week. Dressing Change Frequency Wound #4 Left,Lateral Foot Change Dressing every other day. Wound Cleansing Wound #4 Left,Lateral Foot May shower and wash wound with soap and water. - on days dressing change Primary Wound Dressing Wound #4 Left,Lateral Foot Endoform - moisten with normal saline Secondary Dressing Wound #4 Left,Lateral Foot Foam - to lateral foot for protection Kerlix/Rolled Gauze - secure with tape Dry Gauze Off-Loading Other: - surgical shoe to left foot Laboratory naerobe culture (MICRO) - PCR culture left lateral foot - (ICD10 E11.621 - Type 2 diabetes Bacteria identified in Unspecified specimen by A  mellitus with foot ulcer) LOINC Code: 280-0 Convenience Name: Anerobic culture Electronic Signature(s) Signed: 02/14/2020 5:12:45 PM By: [name] MD Signed: 02/15/2020 5:57:30 PM By: [name] RN, BSN Entered By: [name] on 02/13/2020 16:03:19 -------------------------------------------------------------------------------- Problem List Details Patient Name: Date of Service: [facility] [place] 3/49/1791 2:00 PM Medical Record Number: [id number] Patient Account Number: [id number] Date of Birth/Sex: Treating RN: 01-Sep-1965 (54 y.o. [name] Primary Care Provider: [name] Other Clinician: Referring Provider: Treating Provider/Extender: [name], [name] in Treatment: 0 Active Problems ICD-10 Encounter Code  Description Active Date MDM Diagnosis E11.621 Type 2 diabetes mellitus with foot ulcer 02/07/2020 No Yes T81.31XD Disruption of external operation (surgical) wound, not elsewhere classified, 02/07/2020 No Yes subsequent encounter L97.522 Non-pressure chronic ulcer of other part of left foot with fat layer exposed 02/07/2020 No Yes E11.622 Type 2 diabetes mellitus with other skin ulcer 02/07/2020 No Yes Inactive Problems Resolved Problems Electronic Signature(s) Signed: 02/14/2020 5:12:45 PM By: [name] MD Entered By: [name] on 02/14/2020 07:36:53 -------------------------------------------------------------------------------- Progress Note Details Patient Name: Date of Service: [name] [place]. 0/16/5537 2:00 PM Medical Record Number: [id number] Patient Account Number: [id number] Date of Birth/Sex: Treating RN: 05-02-1966 (54 y.o. [name] Primary Care Provider: [name] Other Clinician: Referring Provider: Treating Provider/Extender: [name], [name] in Treatment: 0 Subjective History of Present Illness (HPI) ADMISSION 05/04/2018 This is a 54 year old man who apparently is a type I diabetic confirmed by appropriate serology testing 6 years ago. He is therefore on insulin. His most recent hemoglobin A1c was 10.7 on 03/27/2018. 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 As a result of a combination of this he is insensate and even has reduced sensation in his upper extremities. His current problem started in early July he noted a blister over the left fourth plantar metatarsal head. He saw [name] his podiatrist on 01/07/2018. At that time he had a wound that measured 2 x 1 x 0.4 cm. An x-ray did not show osteomyelitis. It did not appear that he actually followed up. He was admitted  to [name] on 03/25/2018 through 03/30/2018 with his left foot painful and swollen. He underwent a surgical IandD by [name]. I believe his culture at that time showed Enterococcus faecalis and a bone culture showed enterococcus avium. He is been reviewed by [name] of infectious disease although I have not reviewed his note and he has been on 6 weeks worth of ertapenem which should finish sometime apparently on December 9.. MRI suggested possibility of septic arthritis but did not comment specifically on osteomyelitis. A more recent wound culture was negative. The patient has been referred here for consideration among other things of hyperbaric oxygen versus proceeding with a partial ray amputation. This would involve excision of the fourth metatarsal head. The patient is offloading this and a Darco forefoot off loader. I think he is using iodoform packing. 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 Certainly would be a possibility of a total contact cast. I did not see any arterial studies on him however his peripheral pulses are palpable 05/11/2018; patient started his hyperbarics on Friday but did not dive yesterday for personal issues. He saw [name] on 12//19 he notes that during the hospitalization in October 2019 he had an abscess and underwent a bone biopsy  with culture that showed group B strep, enterococcus avium and Enterococcus faecalis. There was Prevotella from the abscess. He was put on ertapenem for 6 weeks which he completed. Per [name] notable for the fact that he is not been treated with ampicillin or amoxicillin. His hemoglobin A1c was noted to be high at 10.7. He is started on ampicillin sulbactam every 6 again I think this is for better coverage for the enterococcus. We started him on silver alginate as there is still purulent looking drainage coming out of the wound and there is indeed exposed bone. The patient has not  been unwell 05/18/2018; patient continues with hyperbarics he is tolerating this well and has no complaints. He is on Unasyn every 6. We have been using silver alginate to the area over the fourth plantar metatarsal head. He is a poorly controlled diabetic. There is certainly less purulent drainage than when I first saw this man at the beginning of the month. He only sedimentation rate I see on him was 40 on 03/26/2018. States his blood sugars are under better control in the 120s to 130 range. Follows with his endocrinologist in [facility] I change his primary dressing to Prisma today attempting to increase the granulation. After I left the room he called [place] back into look at a area that he noticed on the right foot 2 days ago. Very superficial and in the medial aspect of his high plantar arch [secondary to his neuropathy] he has a custom insert in the shoe on the right foot. 05/24/2018; continues with HBO doing well. He saw infectious disease today and is still on Unasyn every 6 hours. Not making a lot of progress on the left foot with Prisma I changed him to silver alginate strips. The new area on the right midfoot he is putting silver alginate on this as well. 06/01/2018; patient remains on Unasyn. Not making a lot of progress with granulation. I am going to put him in a total contact cast use Prisma for now. He has a high co-pay for an advanced treatment option but I suspect feeling in this wound may come to that 06/04/2018; patient back for the obligatory first total contact cast change. We continue to use Prisma 06/08/2018; the patient is tolerating the cast well although he had some loosening over the tibia which rubbed when he walked. There is no lesion here. We have been using Prisma under the total contact cast. He is in hyperbarics which she is tolerating well 06/15/2018; tolerating total contact cast and hyperbarics well. He continues IV antibiotics and for the underlying osteomyelitis as  directed by infectious disease. His wound is making really nice progress 1/21; Patient's wound is down in depth. Using silver collagen. Ab's end this week and Pic line is to be d/ced 1/28; 0.8 cm in depth which is more than last time. We have been using silver collagen under total contact cast. He continues in hyperbarics. His IV antibiotics are finished PICC line has been removed 2/4; surprisingly today the area is actually epithelialized. There is no open area. There is however a divot where the epithelium is gone down to close the wound surface. I am not completely convinced that this is going to remain viable over time but I think time is the best thing to determine this. There is no evidence of surrounding infection. As of today I do not think a repeat MRI or any other procedure is going to be indicative of whether this is going to  maintain a closed state. I am going to continue him in a total contact cast. He will complete HBO. Next week I will allow him perhaps to graduate into a Darco forefoot off loader, then a healing sandal. I will then send him back to podiatry to see if they can fashion an offloading shoe if this area remains closed 2/10; the patient was taken out of the cast today the area is healed. He has a divot however this is fully epithelialized and the epithelialized tissue looks healthy. I have put him into a Darco forefoot off loader. He will complete his last 2 hyperbaric treatments. I will see him next week and I may transition him into a surgical shoe versus his own shoe. He is following up with [name] of podiatry and I think a diabetic shoe with custom inserts could be provided with the signature provided by his endocrinologist ADMISSION 6/22 CLINICAL DATA: Foot ulcer, question of osteomyelitis EXAM: MRI OF THE LEFT FOOT WITHOUT CONTRAST TECHNIQUE: Multiplanar, multisequence MR imaging of the left was performed. No intravenous contrast was  administered. COMPARISON: Radiograph same day FINDINGS: Bones/Joint/Cartilage Small cystic changes are seen at the fifth metatarsal head, likely degenerative changes. No marrow edema, osseous fracture, or periosteal reaction are seen throughout the osseous structures. Small joint effusion seen at the fifth MTP joint. There appears to be chronic cortical irregularity with erosive type change seen at the fourth metatarsal head which is unchanged since June 28, 2019, however new since 2019. Ligaments The Lisfranc ligaments and collateral ligaments appear to be intact. Muscles and Tendons Diffusely increased signal seen throughout the muscles surrounding the forefoot with fatty atrophy. The flexor and extensor tendons appear to be intact. The plantar fascia is intact. Soft tissues Along the dorsal lateral aspect of the fifth metatarsal shaft there is a focal area superficial ulceration measuring approximately 8 mm in transverse dimension. There is overlying skin thickening and subcutaneous edema seen. No sinus tract or loculated fluid collection is noted. IMPRESSION: 1. Small focal area of ulceration along the dorsal lateral aspect of the fifth metatarsal shaft. No definite evidence of acute osteomyelitis, sinus tract, or abscess. 2. Progressive chronic erosive cortical destruction seen at the fourth metatarsal head since 2019, however unchanged since June 28, 2019, which could be from chronic osteomyelitis or prior injury. 3. Small joint effusion at the fifth MTP joint. Electronically Signed By: Prudencio Pair M.D. On: 08/02/2019 21:46 54 year old type I diabetic who we had in clinic in late 2019 to February 2020 with osteomyelitis of his left fourth metatarsal head and a wound in this area. This eventually healed with IV antibiotics and hyperbaric oxygen. He was relatively well until February of this year when he developed I believe 2 wounds on the left fifth met head 1 plantar  and 1 more laterally. The one more laterally has since closed over but he has been left with a fairly persistent wound in the plantar left fifth met head. Has been following with [name] of podiatry. For an extended period of time he used Medihoney. He received doxycycline and April at which time a culture showed E. coli and MSSA. An MRI done on August 02, 2019 [shown above] did not show osteomyelitis but did suggest some ongoing destruction at the fourth metatarsal head. He was switched to silver alginate as the primary dressing in May. Earlier this month he received a 2-week course of Bactrim due to erythema around the wound. He was referred to Dr. Alfred Levins of infectious  disease who did not think anything was infected and recommended stopping antibiotics. The patient said he felt really well has you finished up the Bactrim but then yesterday morning he became febrile again and noted more erythema around the wound. He was referred here for our review of this. His temperature today is 99.3. He denies any other infectious symptoms no cough no upper respiratory, no dysuria etc. He has been using silver alginate and offloading this area with a surgical shoe. 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 He apparently had shoulder surgery in the fall 2020 I did not look this up he apparently has hardware in this area. Recent x-ray done in Dr. Pasty Arch office on 6/7 was negative for osteo- ABIs have always been noncompressible. Is not previously been thought that arterial insufficiency is contributing to this. 6/29; patient returns the clinic. I thought he might be going to see Dr. Earleen Newport ongoing for this wound but he is back today. He did see Dr. Earleen Newport on 11/22/2019 I believe he agreed with the MRI and apparently was making some arrangements to have this done in [name] which I am fine with if they can compare with the study of March 2. In any case he returns with a clinic of the wound is not as good. He has been using silver alginate. He is still on another antibiotic that he says Dr. Earleen Newport prescribed 7/8; the patient had his MRI which showed a progressive soft tissue ulceration plantar to the fifth MTP joint with increased soft tissue enhancement throughout the small toe no abscess was noted. There was progressive signal changes in the fifth metatarsal head and fifth proximal phalanx without cortical destruction or definite abnormal T1 signal. These findings are nonspecific could be secondary to hyperemia or early osteomyelitis. He is on amoxicillin as directed by Dr. Earleen Newport. There were plans to do a bone biopsy and culture done in the OR on Friday however the patient canceled this because of insurance issues [surgical center not in [place]. In any case I have told him that this procedure in my opinion needs to be done. He is using Silvadene cream READMISSION 02/07/2020 This is a patient who had a progressive ulceration over the fifth MTP. We had him here in June and into the early July. He was also followed by [name] of podiatry. The day after we last saw him on 7/9 he went underwent a excision of the fifth MTP to aid in wound healing over this area. Pathology did not show osteomyelitis. Since then he has been following with Dr. Earleen Newport. Notable that earlier this month he presented with worse wound with purulence. CandS showed MSSA and he was treated with doxycycline and Silvadene cream. More recently he has been changed to Augmentin. He is using Betadine to the wound. He has a forefoot offloading shoe at some point in the postoperative. This wound opened in the surgical line. He is here for our review of this. With regards to his diabetes I spent some time on this today. This was diagnosed in his mid 83s he was on pills for a while and then on insulin.  When he was here last time he told me that he thought he had had antipancreatic antibodies that showed he was type I if so I do not see this. He followed for a period of time with Dr. Meredith Pel  endocrinology at [name] who I gather is currently independent practice in [facility]. I have looked through her notes as it applies to what is available in care everywhere and she consistently calls him a type II diabetic. If he had other studies of his diabetes I do not see this. I have therefore changed my label of him to type II ABI in our clinic was 1.41 on the left 9/13; arrives in clinic today with things not looking as good. He has a small linear wound in the incision line but nonviable rolled edges and debris around the circumference. As well it looks as though he had a deep tissue injury over the base of the fifth metatarsal. Raised may be blistered but no open area here. Also concerning erythema around the wound both distally and proximally Objective Constitutional Patient is hypertensive.. Pulse regular and within target range for patient.Marland Kitchen Respirations regular, non-labored and within target range.. Temperature is normal and within the target range for the patient.Marland Kitchen Appears in no distress. Vitals Time Taken: 2:50 PM, Height: 76 in, Weight: 260 lbs, BMI: 31.6, Temperature: 99.2 F, Pulse: 84 bpm, Respiratory Rate: 18 breaths/min, Blood Pressure: 155/80 mmHg, Capillary Blood Glucose: 152 mg/dl. General Notes: patient stated CBG was 152 this morning General Notes: Wound exam; over the lateral fifth metatarsal head is linear surgical site does not look as healthy as last week. Sides debrided with a #3 curette hemostasis with direct pressure. Sanguinous drainage which I did a PCR culture of the. There is no palpable bone. Erythema both proximally and distally but no palpable tenderness although he has neuropathy. There is no palpable bone Integumentary (Hair, Skin) Wound #4 status is Open. Original  cause of wound was Surgical Injury. The wound is located on the Left,Lateral Foot. The wound measures 2cm length x 0.9cm width x 0.7cm depth; 1.414cm^2 area and 0.99cm^3 volume. There is Fat Layer (Subcutaneous Tissue) exposed. There is no tunneling or undermining noted. There is a small amount of purulent drainage noted. The wound margin is well defined and not attached to the wound base. There is small (1-33%) red granulation within the wound bed. There is a large (67-100%) amount of necrotic tissue within the wound bed including Eschar and Adherent Slough. Assessment Active Problems ICD-10 Type 2 diabetes mellitus with foot ulcer Disruption of external operation (surgical) wound, not elsewhere classified, subsequent encounter Non-pressure chronic ulcer of other part of left foot with fat layer exposed Type 2 diabetes mellitus with other skin ulcer Procedures Wound #4 Pre-procedure diagnosis of Wound #4 is a Diabetic Wound/Ulcer of the Lower Extremity located on the Left,Lateral Foot .Severity of Tissue Pre Debridement is: Fat layer exposed. There was a Excisional Skin/Subcutaneous Tissue Debridement with a total area of 1.8 sq cm performed by [name]., MD. With the following instrument(s): Curette to remove Viable and Non-Viable tissue/material. Material removed includes Subcutaneous Tissue. 1 specimen was taken by a Swab and sent to the lab per facility protocol. A time out was conducted at 15:55, prior to the start of the procedure. A Moderate amount of bleeding was controlled with Pressure. The procedure was tolerated well with a pain level of 0 throughout and a pain level of 0 following the procedure. Post Debridement Measurements: 2cm length x 0.9cm width x 0.7cm depth; 0.99cm^3 volume. Character of Wound/Ulcer Post Debridement is improved. Severity of Tissue Post Debridement is: Fat layer exposed. Post procedure Diagnosis Wound #4: Same as Pre-Procedure Plan Follow-up  Appointments: Return Appointment in 1 week.  Dressing Change Frequency: Wound #4 Left,Lateral Foot: Change Dressing every other day. Wound Cleansing: Wound #4 Left,Lateral Foot: May shower and wash wound with soap and water. - on days dressing change Primary Wound Dressing: Wound #4 Left,Lateral Foot: Endoform - moisten with normal saline Secondary Dressing: Wound #4 Left,Lateral Foot: Foam - to lateral foot for protection Kerlix/Rolled Gauze - secure with tape Dry Gauze Off-Loading: Other: - surgical shoe to left foot Laboratory ordered were: Anerobic culture - PCR culture left lateral foot 1. PCR culture done of the surgical area I did not give him empiric antibiotics. The culture should be back by Wednesday morning 2. I am concerned about the erythema on both ends of the small wound as well as the what looks to be a deep tissue injury over the fifth metatarsal this is not open but appears blistered. On her worst possible scenario he has spreading the infection in this aspect of his foot although that is not obvious today. 3. I am not sure why we have so much trouble here. He says he is offloading this maximally he has a scooter. I wonder whether he walks on the outside of his foot 4. I am going to await return of the PCR culture which should not take much more than a day and a half before I prescribe antibiotics previously he has had problems with MSSA in this area. He had surgery for underlying infection although the pathology did not show osteomyelitis during his removal of the fifth metatarsal head Electronic Signature(s) Signed: 02/14/2020 5:12:45 PM By: [name] MD Entered By: [name] on 02/14/2020 07:43:40 -------------------------------------------------------------------------------- SuperBill Details Patient Name: Date of Service: [name], [place]. 0/08/[birth year]15 Medical Record Number: [id number] Patient Account Number: [id number] Date of Birth/Sex: Treating  RN: 09-Jul-1965 (54 y.o. Marvis Repress Primary Care Provider: [name] Other Clinician: Referring Provider: Treating Provider/Extender: [name], [name] in Treatment: 0 Diagnosis Coding ICD-10 Codes Code Description [phone] Type 2 diabetes mellitus with foot ulcer T81.31XD Disruption of external operation (surgical) wound, not elsewhere classified, subsequent encounter L97.522 Non-pressure chronic ulcer of other part of left foot with fat layer exposed E11.622 Type 2 diabetes mellitus with other skin ulcer Facility Procedures CPT4 Code: [id number] Description: 78675 - DEB SUBQ TISSUE 20 SQ CM/< ICD-10 Diagnosis Description L97.522 Non-pressure chronic ulcer of other part of left foot with fat layer exposed Modifier: Quantity: 1 Physician Procedures : CPT4 Code Description Modifier [id number] - WC PHYS SUBQ TISS 20 SQ CM ICD-10 Diagnosis Description L97.522 Non-pressure chronic ulcer of other part of left foot with fat layer exposed Quantity: 1 Electronic Signature(s) Signed: 02/14/2020 5:12:45 PM By: [name] MD Entered By: [name] on 02/14/2020 07:43:49

## 2020-02-20 ENCOUNTER — Other Ambulatory Visit: Payer: Self-pay

## 2020-02-20 ENCOUNTER — Encounter (HOSPITAL_BASED_OUTPATIENT_CLINIC_OR_DEPARTMENT_OTHER): Payer: BLUE CROSS/BLUE SHIELD | Admitting: Internal Medicine

## 2020-02-20 ENCOUNTER — Ambulatory Visit (INDEPENDENT_AMBULATORY_CARE_PROVIDER_SITE_OTHER): Payer: BLUE CROSS/BLUE SHIELD | Admitting: Podiatry

## 2020-02-20 ENCOUNTER — Ambulatory Visit: Payer: BLUE CROSS/BLUE SHIELD

## 2020-02-20 DIAGNOSIS — L03116 Cellulitis of left lower limb: Secondary | ICD-10-CM

## 2020-02-20 DIAGNOSIS — G8918 Other acute postprocedural pain: Secondary | ICD-10-CM

## 2020-02-20 DIAGNOSIS — L97522 Non-pressure chronic ulcer of other part of left foot with fat layer exposed: Secondary | ICD-10-CM

## 2020-02-20 DIAGNOSIS — E10622 Type 1 diabetes mellitus with other skin ulcer: Secondary | ICD-10-CM | POA: Diagnosis not present

## 2020-02-21 NOTE — Progress Notes (Signed)
[name], [name] ([id number]) Visit Report for 02/20/2020 Arrival Information Details Patient Name: Date of Service: [facility], [place] 02/20/2020 9:30 A M Medical Record Number: [id number] Patient Account Number: [id number] Date of Birth/Sex: Treating RN: [birth year] (54 y.o. [name] Primary Care Mable Dara: Ananias Pilgrim Other Clinician: Referring Decklyn Hyder: Treating Ercelle Winkles/Extender: Alta Corning in Treatment: 1 Visit Information History Since Last Visit Added or deleted any medications: No Patient Arrived: Knee Scooter Any new allergies or adverse reactions: No Arrival Time: 09:36 Had a fall or experienced change in No Accompanied By: self activities of daily living that may affect Transfer Assistance: None risk of falls: Patient Identification Verified: Yes Signs or symptoms of abuse/neglect since last visito No Secondary Verification Process Completed: Yes Hospitalized since last visit: No Patient Requires Transmission-Based Precautions: No Implantable device outside of the clinic excluding No Patient Has Alerts: No cellular tissue based products placed in the center since last visit: Has Dressing in Place as Prescribed: Yes Pain Present Now: No Electronic Signature(s) Signed: 02/20/2020 1:33:30 PM By: Cherylin Mylar Entered By: Cherylin Mylar on 02/20/2020 09:36:53 -------------------------------------------------------------------------------- Clinic Level of Care Assessment Details Patient Name: Date of Service: [facility], [name] 02/20/2020 9:30 A M Medical Record Number: [id number] Patient Account Number: [id number] Date of Birth/Sex: Treating RN: 09/14/1965 (54 y.o. [name] Primary Care Zyion Leidner: Ananias Pilgrim Other Clinician: Referring Jermari Tamargo: Treating Duriel Deery/Extender: Alta Corning in Treatment: 1 Clinic Level of Care Assessment Items TOOL 4 Quantity Score X- 1 0 Use when  only an EandM is performed on FOLLOW-UP visit ASSESSMENTS - Nursing Assessment / Reassessment X- 1 10 Reassessment of Co-morbidities (includes updates in patient status) X- 1 5 Reassessment of Adherence to Treatment Plan ASSESSMENTS - Wound and Skin A ssessment / Reassessment []  - 0 Simple Wound Assessment / Reassessment - one wound X- 2 5 Complex Wound Assessment / Reassessment - multiple wounds []  - 0 Dermatologic / Skin Assessment (not related to wound area) ASSESSMENTS - Focused Assessment []  - 0 Circumferential Edema Measurements - multi extremities []  - 0 Nutritional Assessment / Counseling / Intervention X- 1 5 Lower Extremity Assessment (monofilament, tuning fork, pulses) []  - 0 Peripheral Arterial Disease Assessment (using hand held doppler) ASSESSMENTS - Ostomy and/or Continence Assessment and Care []  - 0 Incontinence Assessment and Management []  - 0 Ostomy Care Assessment and Management (repouching, etc.) PROCESS - Coordination of Care X - Simple Patient / Family Education for ongoing care 1 15 []  - 0 Complex (extensive) Patient / Family Education for ongoing care X- 1 10 Staff obtains , Records, T Results / Process Orders est []  - 0 Staff telephones HHA, Nursing Homes / Clarify orders / etc []  - 0 Routine Transfer to another Facility (non-emergent condition) []  - 0 Routine Hospital Admission (non-emergent condition) []  - 0 New Admissions / / Ordering NPWT Apligraf, etc. , []  - 0 Emergency Hospital Admission (emergent condition) X- 1 10 Simple Discharge Coordination []  - 0 Complex (extensive) Discharge Coordination PROCESS - Special Needs []  - 0 Pediatric / Minor Patient Management []  - 0 Isolation Patient Management []  - 0 Hearing / Language / Visual special needs []  - 0 Assessment of Community assistance (transportation, D/C planning, etc.) []  - 0 Additional assistance / Altered mentation []  - 0 Support  Surface(s) Assessment (bed, cushion, seat, etc.) INTERVENTIONS - Wound Cleansing / Measurement []  - 0 Simple Wound Cleansing - one wound X- 2 5 Complex Wound Cleansing - multiple  wounds X- 1 5 Wound Imaging (photographs - any number of wounds) []  - 0 Wound Tracing (instead of photographs) []  - 0 Simple Wound Measurement - one wound X- 2 5 Complex Wound Measurement - multiple wounds INTERVENTIONS - Wound Dressings []  - 0 Small Wound Dressing one or multiple wounds X- 1 15 Medium Wound Dressing one or multiple wounds []  - 0 Large Wound Dressing one or multiple wounds X- 1 5 Application of Medications - topical []  - 0 Application of Medications - injection INTERVENTIONS - Miscellaneous []  - 0 External ear exam []  - 0 Specimen Collection (cultures, biopsies, blood, body fluids, etc.) []  - 0 Specimen(s) / Culture(s) sent or taken to Lab for analysis []  - 0 Patient Transfer (multiple staff / / Similar devices) []  - 0 Simple Staple / Suture removal (25 or less) []  - 0 Complex Staple / Suture removal (26 or more) []  - 0 Hypo / Hyperglycemic Management (close monitor of Blood Glucose) []  - 0 Ankle / Brachial Index (ABI) - do not check if billed separately X- 1 5 Vital Signs Has the patient been seen at the hospital within the last three years: Yes Total Score: 115 Level Of Care: New/Established - Level 3 Electronic Signature(s) Signed: 02/20/2020 5:09:36 PM By: RN, BSN Entered By: on 02/20/2020 09:56:03 -------------------------------------------------------------------------------- Encounter Discharge Information Details Patient Name: Date of Service: [facility], [name] 02/20/2020 9:30 A M Medical Record Number: Patient Account Number: Date of Birth/Sex: Treating RN: 09-23-1965 (54 y.o. Primary Care Katy Brickell: Other Clinician: Referring [name]: Treating Tunis Gentle/Extender:  in Treatment: 1 Encounter Discharge Information Items Discharge Condition: Stable Ambulatory Status: Walker Discharge Destination: Home Transportation: Private Auto Accompanied By: self Schedule Follow-up Appointment: Yes Clinical Summary of Care: Electronic Signature(s) Signed: 02/20/2020 5:10:33 PM By: Zandra Abts Entered By: Zandra Abts on 02/20/2020 10:09:17 -------------------------------------------------------------------------------- Lower Extremity Assessment Details Patient Name: Date of Service: [facility], 02/22/2020 B. 02/20/2020 9:30 A M Medical Record Number: [id number] Patient Account Number: 05/11/1966 Date of Birth/Sex: Treating RN: 1966/02/05 (54 y.o. Ananias Pilgrim Primary Care Ilay Capshaw: Alta Corning Other Clinician: Referring Brionna Romanek: Treating Cyril Railey/Extender: 02/22/2020, [name] in Treatment: 1 Edema Assessment Assessed: Shawn Stall: No] Shawn Stall: No] Edema: [Left: N] [[name]: o] Calf Left: [name]: Point of Measurement: cm From Medial Instep 34 cm cm Ankle Left: [name]: Point of Measurement: cm From Medial Instep 21.5 cm cm Vascular Assessment Pulses: Dorsalis Pedis Palpable: [Left:Yes] Electronic Signature(s) Signed: 02/20/2020 1:33:30 PM By: [facility] Entered By: [place] on 02/20/2020 09:38:26 -------------------------------------------------------------------------------- Multi Wound Chart Details Patient Name: Date of Service: [facility], [name] 02/20/2020 9:30 A M Medical Record Number: 05/11/1966 Patient Account Number: [id number] Date of Birth/Sex: Treating RN: 1965/06/19 (54 y.o. [name] Primary Care Jayen Bromwell: Kyra Searles Other Clinician: Referring Ralf Konopka: Treating Damion Kant/Extender: Franne Forts, [name] in Treatment: 1 Vital Signs Height(in): 76 Capillary Blood Glucose(mg/dl): 02/22/2020 Weight(lbs): Cherylin Mylar Pulse(bpm): 74 Body Mass  Index(BMI): 32 Blood Pressure(mmHg): 174/95 Temperature(F): 98.2 Respiratory Rate(breaths/min): 18 Photos: [4:No Photos Left, Lateral Foot] [5:No Photos Left, Proximal, Lateral Foot] [N/A:N/A N/A] Wound Location: [4:Surgical Injury] [5:Gradually Appeared] [N/A:N/A] Wounding Event: [4:Diabetic Wound/Ulcer of the Lower] [5:Diabetic Wound/Ulcer of the Lower] [N/A:N/A] Primary Etiology: [4:Extremity Cataracts, Hypertension, Type I] [5:Extremity Cataracts, Hypertension, Type I] [N/A:N/A] Comorbid History: [4:Diabetes, Osteomyelitis, Neuropathy Diabetes, Osteomyelitis, Neuropathy 12/09/2019] [5:02/20/2020] [N/A:N/A] Date Acquired: [4:1] [5:0] [N/A:N/A] Weeks of Treatment: [4:Open] [5:Open] [N/A:N/A]  Wound Status: [4:0.5x0.5x0.3] [5:1.2x1x0.1] [N/A:N/A] Measurements L x W x D (cm) [4:0.196] [5:0.942] [N/A:N/A] A (cm) : rea [4:0.059] [5:0.094] [N/A:N/A] Volume (cm) : [4:35.90%] [5:N/A] [N/A:N/A] % Reduction in A rea: [4:67.90%] [5:N/A] [N/A:N/A] % Reduction in Volume: [4:Grade 2] [5:Grade 2] [N/A:N/A] Classification: [4:Small] [5:Small] [N/A:N/A] Exudate A mount: [4:Serosanguineous] [5:Serosanguineous] [N/A:N/A] Exudate Type: [4:red, brown] [5:red, brown] [N/A:N/A] Exudate Color: [4:Well defined, not attached] [5:Distinct, outline attached] [N/A:N/A] Wound Margin: [4:Large (67-100%)] [5:Large (67-100%)] [N/A:N/A] Granulation A mount: [4:Red] [5:Red, Pink] [N/A:N/A] Granulation Quality: [4:Small (1-33%)] [5:None Present (0%)] [N/A:N/A] Necrotic A mount: [4:Fat Layer (Subcutaneous Tissue): Yes Fat Layer (Subcutaneous Tissue): Yes N/A] Exposed Structures: [4:Fascia: No Tendon: No Muscle: No Joint: No Bone: No Small (1-33%)] [5:Fascia: No Tendon: No Muscle: No Joint: No Bone: No None] [N/A:N/A] Treatment Notes Electronic Signature(s) Signed: 02/20/2020 5:09:36 PM By: Zandra AbtsLynch, Shatara RN, BSN Signed: 02/21/2020 4:19:57 PM By: Baltazar Najjarobson, Michael MD Entered By: Baltazar Najjarobson, Michael on 02/20/2020  10:00:06 -------------------------------------------------------------------------------- Multi-Disciplinary Care Plan Details [facility]: [facility] [name] 02/20/2020 9:30 A M Medical Record Number: [id number] Patient Account Number: [id number] Date of Birth/Sex: Treating RN: [birth year] (54 y.o. Erik KollerM) Lynch, Shatara Primary Care Qamar Rosman: Ananias PilgrimAsres, Alehegn Other Clinician: Referring Aislynn Cifelli: Treating Nevyn Bossman/Extender: Lianne Cureobson, Michael Asres, [name] in Treatment: 1 Active Inactive Wound/Skin Impairment Nursing Diagnoses: Knowledge deficit related to ulceration/compromised skin integrity Goals: Patient/caregiver will verbalize understanding of skin care regimen Date Initiated: 02/07/2020 Target Resolution Date: 03/08/2020 Goal Status: Active Ulcer/skin breakdown will have a volume reduction of 30% by week 4 Date Initiated: 02/07/2020 Target Resolution Date: 03/08/2020 Goal Status: Active Interventions: Assess patient/caregiver ability to obtain necessary supplies Assess patient/caregiver ability to perform ulcer/skin care regimen upon admission and as needed Assess ulceration(s) every visit Notes: Electronic Signature(s) Signed: 02/20/2020 5:09:36 PM By: Zandra AbtsLynch, Shatara RN, BSN Entered By: Zandra AbtsLynch, Shatara on 02/20/2020 09:35:12 -------------------------------------------------------------------------------- Pain Assessment Details [facility]: [facility] [name] 02/20/2020 9:30 A M Medical Record Number: [id number] Patient Account Number: [id number] Date of Birth/Sex: Treating RN: [birth year] (54 y.o. Erik RightM) Dwiggins, Shannon Primary Care Ciena Sampley: Ananias PilgrimAsres, Alehegn Other Clinician: Referring Malacai Grantz: Treating Delmont Prosch/Extender: Lianne Cureobson, Michael Asres, [name] in Treatment: 1 Active Problems Location of Pain Severity and Description of Pain Patient Has Paino No Site Locations Pain Management and Medication Current Pain  Management: Electronic Signature(s) Signed: 02/20/2020 1:33:30 PM By: Cherylin Mylarwiggins, Shannon Entered By: Cherylin Mylarwiggins, Shannon on 02/20/2020 09:37:48 -------------------------------------------------------------------------------- Patient/Caregiver Education Details [facility]: [facility] 9/20/2021andnbsp9:30 A M Medical Record Number: [id number] Patient Account Number: [id number] Date of Birth/Gender: Treating RN: [birth year] (54 y.o. Erik KollerM) Lynch, Shatara Primary Care Physician: Ananias PilgrimAsres, Alehegn Other Clinician: Referring Physician: Treating Physician/Extender: Alta Corningobson, Michael Asres, [name] in Treatment: 1 Education Assessment Education Provided To: Patient Education Topics Provided Wound/Skin Impairment: Methods: Explain/Verbal Responses: State content correctly Electronic Signature(s) Signed: 02/20/2020 5:09:36 PM By: Zandra AbtsLynch, Shatara RN, BSN Entered By: Zandra AbtsLynch, Shatara on 02/20/2020 09:35:22 -------------------------------------------------------------------------------- Wound Assessment Details [facility]: [facility] [place] Dianah FieldMUEL B. 02/20/2020 9:30 A M Medical Record Number: [id number] Patient Account Number: [id number] Date of Birth/Sex: Treating RN: [birth year] (54 y.o. Erik RightM) Dwiggins, Shannon Primary Care Ahkeem Goede: Ananias PilgrimAsres, Alehegn Other Clinician: Referring Roe Koffman: Treating Eden Rho/Extender: Lianne Cureobson, Michael Asres, [name] in Treatment: 1 Wound Status Wound Number: 4 Primary Diabetic Wound/Ulcer of the Lower Extremity Etiology: Wound Location: Left, Lateral Foot Wound Status: Open Wounding Event: Surgical Injury Comorbid Cataracts, Hypertension, Type I Diabetes, Osteomyelitis, Date Acquired: 12/09/2019 History: Neuropathy Weeks Of Treatment:  1 Clustered Wound: No Wound Measurements Length: (cm) 0.5 Width: (cm) 0.5 Depth: (cm) 0.3 Area: (cm) 0.196 Volume: (cm) 0.059 % Reduction in Area: 35.9% % Reduction in Volume:  67.9% Epithelialization: Small (1-33%) Tunneling: No Undermining: No Wound Description Classification: Grade 2 Wound Margin: Well defined, not attached Exudate Amount: Small Exudate Type: Serosanguineous Exudate Color: red, brown Foul Odor After Cleansing: No Slough/Fibrino Yes Wound Bed Granulation Amount: Large (67-100%) Exposed Structure Granulation Quality: Red Fascia Exposed: No Necrotic Amount: Small (1-33%) Fat Layer (Subcutaneous Tissue) Exposed: Yes Necrotic Quality: Adherent Slough Tendon Exposed: No Muscle Exposed: No Joint Exposed: No Bone Exposed: No Treatment Notes Wound #4 (Left, Lateral Foot) 1. Cleanse With Wound Cleanser 3. Primary Dressing Applied Endoform 4. Secondary Dressing Dry Gauze Roll Gauze Foam 7. Footwear/Offloading device applied Felt/Foam Notes knee walker Electronic Signature(s) Signed: 02/20/2020 1:33:30 PM By: Cherylin Mylar Entered By: Cherylin Mylar on 02/20/2020 09:40:27 -------------------------------------------------------------------------------- Wound Assessment Details Patient Name: Date of Service: [facility], [name] 02/20/2020 9:30 A M Medical Record Number: [id number] Patient Account Number: [id number] Date of Birth/Sex: Treating RN: 1965/08/17 (54 y.o. [name] Primary Care Cedricka Sackrider: Ananias Pilgrim Other Clinician: Referring Kelby Lotspeich: Treating Lamoyne Hessel/Extender: [name], [name] in Treatment: 1 Wound Status Wound Number: 5 Primary Diabetic Wound/Ulcer of the Lower Extremity Etiology: Wound Location: Left, Proximal, Lateral Foot Wound Status: Open Wounding Event: Gradually Appeared Comorbid Cataracts, Hypertension, Type I Diabetes, Osteomyelitis, Date Acquired: 02/20/2020 History: Neuropathy Weeks Of Treatment: 0 Clustered Wound: No Wound Measurements Length: (cm) 1.2 Width: (cm) 1 Depth: (cm) 0.1 Area: (cm) 0.942 Volume: (cm) 0.094 % Reduction in Area: %  Reduction in Volume: Epithelialization: None Tunneling: No Undermining: No Wound Description Classification: Grade 2 Wound Margin: Distinct, outline attached Exudate Amount: Small Exudate Type: Serosanguineous Exudate Color: red, brown Foul Odor After Cleansing: No Slough/Fibrino No Wound Bed Granulation Amount: Large (67-100%) Exposed Structure Granulation Quality: Red, Pink Fascia Exposed: No Necrotic Amount: None Present (0%) Fat Layer (Subcutaneous Tissue) Exposed: Yes Tendon Exposed: No Muscle Exposed: No Joint Exposed: No Bone Exposed: No Treatment Notes Wound #5 (Left, Proximal, Lateral Foot) 1. Cleanse With Wound Cleanser 3. Primary Dressing Applied Calcium Alginate Ag 4. Secondary Dressing Dry Gauze Roll Gauze 5. Secured With Medipore tape 7. Footwear/Offloading device applied Felt/Foam Surgical shoe Electronic Signature(s) Signed: 02/20/2020 1:33:30 PM By: Cherylin Mylar Entered By: Cherylin Mylar on 02/20/2020 09:41:35 -------------------------------------------------------------------------------- Vitals Details Patient Name: Date of Service: [facility], [name] 02/20/2020 9:30 A M Medical Record Number: [id number] Patient Account Number: [id number] Date of Birth/Sex: Treating RN: 1965/09/14 (54 y.o. [name] Primary Care Mychael Soots: Ananias Pilgrim Other Clinician: Referring Seanmichael Salmons: Treating Nickola Lenig/Extender: [name], [name] in Treatment: 1 Vital Signs Time Taken: 08:30 Temperature (F): 98.2 Height (in): 76 Pulse (bpm): 74 Weight (lbs): 260 Respiratory Rate (breaths/min): 18 Weight (lbs): 260 Respiratory Rate (breaths/min): 18 Body Mass Index (BMI): 31.6 Blood Pressure (mmHg): 174/95 Capillary Blood Glucose (mg/dl): 035 Reference Range: 80 - 120 mg / dl Notes patient stated CBG this morning was 152 Electronic Signature(s) Signed: 02/20/2020 1:33:30 PM By: Cherylin Mylar Entered By: Cherylin Mylar on 02/20/2020 09:37:42

## 2020-02-21 NOTE — Progress Notes (Signed)
[name], [name] ([id number]) Visit Report for 02/20/2020 HPI Details Patient Name: Date of Service: [facility], [place] 9/37/3428 9:30 A M Medical Record Number: [id number] Patient Account Number: [id number] Date of Birth/Sex: Treating RN: [birth year] (54 y.o. [name] Primary Care Provider: [name] Other Clinician: Referring Provider: Treating Provider/Extender: [name], [name] in Treatment: 1 History of Present Illness HPI Description: ADMISSION 05/04/2018 This is a 54 year old man who apparently is a type I diabetic confirmed by appropriate serology testing 6 years ago. He is therefore on insulin. His most recent hemoglobin A1c was 10.7 on 03/27/2018. 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 As a result of a combination of this he is insensate and even has reduced sensation in his upper extremities. His current problem started in early July he noted a blister over the left fourth plantar metatarsal head. He saw [name] his podiatrist on 01/07/2018. At that time he had a wound that measured 2 x 1 x 0.4 cm. An x-ray did not show osteomyelitis. It did not appear that he actually followed up. He was admitted to hospital on 03/25/2018 through 03/30/2018 with his left foot painful and swollen. He underwent a surgical IandD by [name]. I believe his culture at that time showed Enterococcus faecalis and a bone culture showed enterococcus avium. He is been reviewed by [name] of infectious disease although I have not reviewed his note and he has been on 6 weeks worth of ertapenem which should finish sometime apparently on December 9.. MRI suggested possibility of septic arthritis but did not comment specifically on osteomyelitis. A more recent wound culture was negative. The patient has been  referred here for consideration among other things of hyperbaric oxygen versus proceeding with a partial ray amputation. This would involve excision of the fourth metatarsal head. The patient is offloading this and a Darco forefoot off loader. I think he is using iodoform packing. 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 Certainly would be a possibility of a total contact cast. I did not see any arterial studies on him however his peripheral pulses are palpable 05/11/2018; patient started his hyperbarics on Friday but did not dive yesterday for personal issues. He saw [name] on 12//19 he notes that during the hospitalization in October 2019 he had an abscess and underwent a bone biopsy with culture that showed group B strep, enterococcus avium and Enterococcus faecalis. There was Prevotella from the abscess. He was put on ertapenem for 6 weeks which he completed. Per [name] notable for the fact that he is not been treated with ampicillin or amoxicillin. His hemoglobin A1c was noted to be high at 10.7. He is started on ampicillin sulbactam every 6 again I think this is for better coverage for the enterococcus. We started him on silver alginate as there is still purulent looking drainage coming out of the wound and there is indeed exposed bone. The patient has not been unwell 05/18/2018; patient continues with hyperbarics he is tolerating this well and has no complaints. He is on Unasyn every 6. We have been using silver alginate to the area over the fourth plantar metatarsal head. He is a poorly controlled diabetic. There is certainly less purulent drainage than when I first saw this man at the beginning of the month. He only sedimentation rate I see on  him was 40 on 03/26/2018. States his blood sugars are under better control in the 120s to 130 range. Follows with his endocrinologist in [facility] I change his primary dressing to Prisma today attempting to increase the  granulation. After I left the room he called [place] back into look at a area that he noticed on the right foot 2 days ago. Very superficial and in the medial aspect of his high plantar arch [secondary to his neuropathy] he has a custom insert in the shoe on the right foot. 05/24/2018; continues with HBO doing well. He saw infectious disease today and is still on Unasyn every 6 hours. Not making a lot of progress on the left foot with Prisma I changed him to silver alginate strips. The new area on the right midfoot he is putting silver alginate on this as well. 06/01/2018; patient remains on Unasyn. Not making a lot of progress with granulation. I am going to put him in a total contact cast use Prisma for now. He has a high co-pay for an advanced treatment option but I suspect feeling in this wound may come to that 06/04/2018; patient back for the obligatory first total contact cast change. We continue to use Prisma 06/08/2018; the patient is tolerating the cast well although he had some loosening over the tibia which rubbed when he walked. There is no lesion here. We have been using Prisma under the total contact cast. He is in hyperbarics which she is tolerating well 06/15/2018; tolerating total contact cast and hyperbarics well. He continues IV antibiotics and for the underlying osteomyelitis as directed by infectious disease. His wound is making really nice progress 1/21; Patient's wound is down in depth. Using silver collagen. Ab's end this week and Pic line is to be d/ced 1/28; 0.8 cm in depth which is more than last time. We have been using silver collagen under total contact cast. He continues in hyperbarics. His IV antibiotics are finished PICC line has been removed 2/4; surprisingly today the area is actually epithelialized. There is no open area. There is however a divot where the epithelium is gone down to close the wound surface. I am not completely convinced that this is going to remain viable  over time but I think time is the best thing to determine this. There is no evidence of surrounding infection. As of today I do not think a repeat MRI or any other procedure is going to be indicative of whether this is going to maintain a closed state. I am going to continue him in a total contact cast. He will complete HBO. Next week I will allow him perhaps to graduate into a Darco forefoot off loader, then a healing sandal. I will then send him back to podiatry to see if they can fashion an offloading shoe if this area remains closed 2/10; the patient was taken out of the cast today the area is healed. He has a divot however this is fully epithelialized and the epithelialized tissue looks healthy. I have put him into a Darco forefoot off loader. He will complete his last 2 hyperbaric treatments. I will see him next week and I may transition him into a surgical shoe versus his own shoe. He is following up with [name] of podiatry and I think a diabetic shoe with custom inserts could be provided with the signature provided by his endocrinologist ADMISSION 6/22 CLINICAL DATA: Foot ulcer, question of osteomyelitis EXAM: MRI OF THE LEFT FOOT WITHOUT CONTRAST TECHNIQUE:  Multiplanar, multisequence MR imaging of the left was performed. No intravenous contrast was administered. COMPARISON: Radiograph same day FINDINGS: Bones/Joint/Cartilage Small cystic changes are seen at the fifth metatarsal head, likely degenerative changes. No marrow edema, osseous fracture, or periosteal reaction are seen throughout the osseous structures. Small joint effusion seen at the fifth MTP joint. There appears to be chronic cortical irregularity with erosive type change seen at the fourth metatarsal head which is unchanged since June 28, 2019, however new since 2019. Ligaments The Lisfranc ligaments and collateral ligaments appear to be intact. Muscles and Tendons Diffusely increased signal seen throughout  the muscles surrounding the forefoot with fatty atrophy. The flexor and extensor tendons appear to be intact. The plantar fascia is intact. Soft tissues Along the dorsal lateral aspect of the fifth metatarsal shaft there is a focal area superficial ulceration measuring approximately 8 mm in transverse dimension. There is overlying skin thickening and subcutaneous edema seen. No sinus tract or loculated fluid collection is noted. IMPRESSION: 1. Small focal area of ulceration along the dorsal lateral aspect of the fifth metatarsal shaft. No definite evidence of acute osteomyelitis, sinus tract, or abscess. 2. Progressive chronic erosive cortical destruction seen at the fourth metatarsal head since 2019, however unchanged since June 28, 2019, which could be from chronic osteomyelitis or prior injury. 3. Small joint effusion at the fifth MTP joint. Electronically Signed By: Prudencio Pair M.D. On: 08/02/2019 1:19 54 year old type I diabetic who we had in clinic in late 2019 to February 2020 with osteomyelitis of his left fourth metatarsal head and a wound in this area. This eventually healed with IV antibiotics and hyperbaric oxygen. He was relatively well until February of this year when he developed I believe 2 wounds on the left fifth met head 1 plantar and 1 more laterally. The one more laterally has since closed over but he has been left with a fairly persistent wound in the plantar left fifth met head. Has been following with [name] of podiatry. For an extended period of time he used Medihoney. He received doxycycline and April at which time a culture showed E. coli and MSSA. An MRI done on August 02, 2019 [shown above] did not show osteomyelitis but did suggest some ongoing destruction at the fourth metatarsal head. He was switched to silver alginate as the primary dressing in May. Earlier this month he received a 2-week course of Bactrim due to erythema around the wound. He was  referred to Dr. Alfred Levins of infectious disease who did not think anything was infected and recommended stopping antibiotics. The patient said he felt really well has you finished up the Bactrim but then yesterday morning he became febrile again and noted more erythema around the wound. He was referred here for our review of this. His temperature today is 99.3. He denies any other infectious symptoms no cough no upper respiratory, no dysuria etc. He has been using silver alginate and offloading this area with a surgical shoe. 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 He apparently had shoulder surgery in the fall 2020 I did not look this up he apparently has hardware in this area. Recent x-ray done in Dr. Pasty Arch office on 6/7 was negative for osteo- ABIs have always been noncompressible. Is not previously been thought that arterial insufficiency is contributing to this. 6/29; patient returns the clinic. I thought he might be going  to see Dr. Earleen Newport ongoing for this wound but he is back today. He did see Dr. Earleen Newport on 11/22/2019 I believe he agreed with the MRI and apparently was making some arrangements to have this done in [facility] which I am fine with if they can compare with the study of March 2. In any case he returns with a clinic of the wound is not as good. He has been using silver alginate. He is still on another antibiotic that he says Dr. Earleen Newport prescribed 7/8; the patient had his MRI which showed a progressive soft tissue ulceration plantar to the fifth MTP joint with increased soft tissue enhancement throughout the small toe no abscess was noted. There was progressive signal changes in the fifth metatarsal head and fifth proximal phalanx without cortical destruction or definite abnormal T1 signal. These findings are nonspecific could be secondary  to hyperemia or early osteomyelitis. He is on amoxicillin as directed by Dr. Earleen Newport. There were plans to do a bone biopsy and culture done in the OR on Friday however the patient canceled this because of insurance issues [surgical center not in [place]. In any case I have told him that this procedure in my opinion needs to be done. He is using Silvadene cream READMISSION 02/07/2020 This is a patient who had a progressive ulceration over the fifth MTP. We had him here in June and into the early July. He was also followed by [name] of podiatry. The day after we last saw him on 7/9 he went underwent a excision of the fifth MTP to aid in wound healing over this area. Pathology did not show osteomyelitis. Since then he has been following with Dr. Earleen Newport. Notable that earlier this month he presented with worse wound with purulence. CandS showed MSSA and he was treated with doxycycline and Silvadene cream. More recently he has been changed to Augmentin. He is using Betadine to the wound. He has a forefoot offloading shoe at some point in the postoperative. This wound opened in the surgical line. He is here for our review of this. With regards to his diabetes I spent some time on this today. This was diagnosed in his mid 100s he was on pills for a while and then on insulin. When he was here last time he told me that he thought he had had antipancreatic antibodies that showed he was type I if so I do not see this. He followed for a period of time with Dr. Meredith Pel endocrinology at [facility] who I gather is currently independent practice in [facility]. I have looked through her notes as it applies to what is available in care everywhere and she consistently calls him a type II diabetic. If he had other studies of his diabetes I do not see this. I have therefore changed my label of him to type II ABI in our clinic was 1.41 on the left 9/13; arrives in clinic today with things not looking as good. He has a  small linear wound in the incision line but nonviable rolled edges and debris around the circumference. As well it looks as though he had a deep tissue injury over the base of the fifth metatarsal. Raised may be blistered but no open area here. Also concerning erythema around the wound both distally and proximally 9/20; PCR culture I did last time showed high Enterococcus faecalis, medium MRSA and 3 different anaerobic's including medium Peptostreptococcus magnus. I gave him a combination of Augmentin and trimethoprim sulfamethoxazole starting  on 9/16 for 10 days. He is tolerating these well He comes in today. His original wound area at the surgical site only has a small crevice we are using endoform here. Not surprisingly the area over the base of the fifth metatarsal has open with a superficial wound. He has a wrap/surgical shoe injury dorsally on the left foot he is asking about a total contact cast and I think what I know about this area suggests that the lateral part of his foot is a weightbearing surface and I think we could certainly justify this. Electronic Signature(s) Signed: 02/21/2020 4:19:57 PM By: [name] MD Entered By: [name] on 02/20/2020 10:02:07 -------------------------------------------------------------------------------- Physical Exam Details Patient Name: Date of Service: [facility], [place] 09/08/8117 9:30 A M Medical Record Number: [id number] Patient Account Number: [id number] Date of Birth/Sex: Treating RN: 1965/10/15 (54 y.o. [name] Primary Care Provider: [name] Other Clinician: Referring Provider: Treating Provider/Extender: [name], [name] in Treatment: 1 Constitutional Patient is hypertensive.. Pulse regular and within target range for patient.Marland Kitchen Respirations regular, non-labored and within target range.. Temperature is normal and within the target range for the patient.Marland Kitchen Appears in no  distress. Cardiovascular Pedal pulses are palpable. Musculoskeletal The patient inverts at the ankle making outside part of his foot a weightbearing surface. Integumentary (Hair, Skin) No erythema around any of the wounds. Notes Wound exam Over the left fifth metatarsal head a very small area is still open here. This is quite a bit better than last week we are still you going to use endoform here. Over the base of the left fifth metatarsal a superficial wound breakdown I am not surprised by this it was blistered and threatening last week. No evidence of infection around this Over the dorsal foot there is I think a wrap injury from the surgical shoe I am not even sure this is open but certainly there was irritation in this area Electronic Signature(s) Signed: 02/21/2020 4:19:57 PM By: [name] MD Entered By: [name] on 02/20/2020 10:04:14 -------------------------------------------------------------------------------- Physician Orders Details Patient Name: Date of Service: [facility], [place] 07/02/8655 9:30 A M Medical Record Number: [id number] Patient Account Number: [id number] Date of Birth/Sex: Treating RN: 23-Apr-1966 (54 y.o. [name] Primary Care Provider: [name] Other Clinician: Referring Provider: Treating Provider/Extender: Elwyn Lade in Treatment: 1 Verbal / Phone Orders: No Diagnosis Coding ICD-10 Coding Code Description E11.621 Type 2 diabetes mellitus with foot ulcer T81.31XD Disruption of external operation (surgical) wound, not elsewhere classified, subsequent encounter L97.522 Non-pressure chronic ulcer of other part of left foot with fat layer exposed E11.622 Type 2 diabetes mellitus with other skin ulcer Follow-up Appointments Return Appointment in 1 week. Dressing Change Frequency Wound #4 Left,Lateral Foot Change Dressing every other day. Wound #5 Left,Proximal,Lateral Foot Change Dressing every  other day. Wound Cleansing May shower and wash wound with soap and water. - on days that dressing is changed Primary Wound Dressing Wound #4 Left,Lateral Foot Endoform - moisten with normal saline Wound #5 Left,Proximal,Lateral Foot Calcium Alginate with Silver Secondary Dressing Wound #4 Left,Lateral Foot Foam - top of foot for protection Kerlix/Rolled Gauze - secure with tape Dry Gauze Wound #5 Left,Proximal,Lateral Foot Foam - top of foot for protection Kerlix/Rolled Gauze - secure with tape Dry Gauze Off-Loading Other: - surgical shoe to left foot Electronic Signature(s) Signed: 02/20/2020 5:09:36 PM By: [name] RN, BSN Signed: 02/21/2020 4:19:57 PM By: [name] MD Entered By: [name] on  02/20/2020 09:57:32 -------------------------------------------------------------------------------- Problem List Details Patient Name: Date of Service: [facility], [place] 6/62/9476 9:30 A M Medical Record Number: [id number] Patient Account Number: [id number] Date of Birth/Sex: Treating RN: Dec 31, 1965 (54 y.o. [name] Primary Care Provider: [name] Other Clinician: Referring Provider: Treating Provider/Extender: [name], [name] in Treatment: 1 Active Problems ICD-10 Encounter Code Description Active Date MDM Diagnosis E11.621 Type 2 diabetes mellitus with foot ulcer 02/07/2020 No Yes T81.31XD Disruption of external operation (surgical) wound, not elsewhere classified, 02/07/2020 No Yes subsequent encounter L97.522 Non-pressure chronic ulcer of other part of left foot with fat layer exposed 02/07/2020 No Yes E11.622 Type 2 diabetes mellitus with other skin ulcer 02/07/2020 No Yes Inactive Problems Resolved Problems Electronic Signature(s) Signed: 02/21/2020 4:19:57 PM By: [name] MD Entered By: [name] on 02/20/2020  09:59:56 -------------------------------------------------------------------------------- Progress Note Details Patient Name: Date of Service: [facility], [place] 5/68/1275 9:30 A M Medical Record Number: [id number] Patient Account Number: [id number] Date of Birth/Sex: Treating RN: 1965-12-14 (54 y.o. [name] Primary Care Provider: [name] Other Clinician: Referring Provider: Treating Provider/Extender: [name], [name] in Treatment: 1 Subjective History of Present Illness (HPI) ADMISSION 05/04/2018 This is a 54 year old man who apparently is a type I diabetic confirmed by appropriate serology testing 6 years ago. He is therefore on insulin. His most recent hemoglobin A1c was 10.7 on 03/27/2018. 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 As a result of a combination of this he is insensate and even has reduced sensation in his upper extremities. His current problem started in early July he noted a blister over the left fourth plantar metatarsal head. He saw [name] his podiatrist on 01/07/2018. At that time he had a wound that measured 2 x 1 x 0.4 cm. An x-ray did not show osteomyelitis. It did not appear that he actually followed up. He was admitted to hospital on 03/25/2018 through 03/30/2018 with his left foot painful and swollen. He underwent a surgical IandD by [name]. I believe his culture at that time showed Enterococcus faecalis and a bone culture showed enterococcus avium. He is been reviewed by [name] of infectious disease although I have not reviewed his note and he has been on 6 weeks worth of ertapenem which should finish sometime apparently on December 9.. MRI suggested possibility of septic arthritis but did not comment specifically on osteomyelitis. A more recent wound  culture was negative. The patient has been referred here for consideration among other things of hyperbaric oxygen versus proceeding with a partial ray amputation. This would involve excision of the fourth metatarsal head. The patient is offloading this and a Darco forefoot off loader. I think he is using iodoform packing. 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 Certainly would be a possibility of a total contact cast. I did not see any arterial studies on him however his peripheral pulses are palpable 05/11/2018; patient started his hyperbarics on Friday but did not dive yesterday for personal issues. He saw [name] on 12//19 he notes that during the hospitalization in October 2019 he had an abscess and underwent a bone biopsy with culture that showed group B strep, enterococcus avium and Enterococcus faecalis. There was Prevotella from the abscess. He was put on ertapenem for 6 weeks which he completed. Per [name] notable  for the fact that he is not been treated with ampicillin or amoxicillin. His hemoglobin A1c was noted to be high at 10.7. He is started on ampicillin sulbactam every 6 again I think this is for better coverage for the enterococcus. We started him on silver alginate as there is still purulent looking drainage coming out of the wound and there is indeed exposed bone. The patient has not been unwell 05/18/2018; patient continues with hyperbarics he is tolerating this well and has no complaints. He is on Unasyn every 6. We have been using silver alginate to the area over the fourth plantar metatarsal head. He is a poorly controlled diabetic. There is certainly less purulent drainage than when I first saw this man at the beginning of the month. He only sedimentation rate I see on him was 40 on 03/26/2018. States his blood sugars are under better control in the 120s to 130 range. Follows with his endocrinologist in [facility] I change his primary dressing  to Prisma today attempting to increase the granulation. After I left the room he called [place] back into look at a area that he noticed on the right foot 2 days ago. Very superficial and in the medial aspect of his high plantar arch [secondary to his neuropathy] he has a custom insert in the shoe on the right foot. 05/24/2018; continues with HBO doing well. He saw infectious disease today and is still on Unasyn every 6 hours. Not making a lot of progress on the left foot with Prisma I changed him to silver alginate strips. The new area on the right midfoot he is putting silver alginate on this as well. 06/01/2018; patient remains on Unasyn. Not making a lot of progress with granulation. I am going to put him in a total contact cast use Prisma for now. He has a high co-pay for an advanced treatment option but I suspect feeling in this wound may come to that 06/04/2018; patient back for the obligatory first total contact cast change. We continue to use Prisma 06/08/2018; the patient is tolerating the cast well although he had some loosening over the tibia which rubbed when he walked. There is no lesion here. We have been using Prisma under the total contact cast. He is in hyperbarics which she is tolerating well 06/15/2018; tolerating total contact cast and hyperbarics well. He continues IV antibiotics and for the underlying osteomyelitis as directed by infectious disease. His wound is making really nice progress 1/21; Patient's wound is down in depth. Using silver collagen. Ab's end this week and Pic line is to be d/ced 1/28; 0.8 cm in depth which is more than last time. We have been using silver collagen under total contact cast. He continues in hyperbarics. His IV antibiotics are finished PICC line has been removed 2/4; surprisingly today the area is actually epithelialized. There is no open area. There is however a divot where the epithelium is gone down to close the wound surface. I am not completely  convinced that this is going to remain viable over time but I think time is the best thing to determine this. There is no evidence of surrounding infection. As of today I do not think a repeat MRI or any other procedure is going to be indicative of whether this is going to maintain a closed state. I am going to continue him in a total contact cast. He will complete HBO. Next week I will allow him perhaps to graduate into a Darco forefoot off  loader, then a healing sandal. I will then send him back to podiatry to see if they can fashion an offloading shoe if this area remains closed 2/10; the patient was taken out of the cast today the area is healed. He has a divot however this is fully epithelialized and the epithelialized tissue looks healthy. I have put him into a Darco forefoot off loader. He will complete his last 2 hyperbaric treatments. I will see him next week and I may transition him into a surgical shoe versus his own shoe. He is following up with [name] of podiatry and I think a diabetic shoe with custom inserts could be provided with the signature provided by his endocrinologist ADMISSION 6/22 CLINICAL DATA: Foot ulcer, question of osteomyelitis EXAM: MRI OF THE LEFT FOOT WITHOUT CONTRAST TECHNIQUE: Multiplanar, multisequence MR imaging of the left was performed. No intravenous contrast was administered. COMPARISON: Radiograph same day FINDINGS: Bones/Joint/Cartilage Small cystic changes are seen at the fifth metatarsal head, likely degenerative changes. No marrow edema, osseous fracture, or periosteal reaction are seen throughout the osseous structures. Small joint effusion seen at the fifth MTP joint. There appears to be chronic cortical irregularity with erosive type change seen at the fourth metatarsal head which is unchanged since June 28, 2019, however new since 2019. Ligaments The Lisfranc ligaments and collateral ligaments appear to be intact. Muscles and  Tendons Diffusely increased signal seen throughout the muscles surrounding the forefoot with fatty atrophy. The flexor and extensor tendons appear to be intact. The plantar fascia is intact. Soft tissues Along the dorsal lateral aspect of the fifth metatarsal shaft there is a focal area superficial ulceration measuring approximately 8 mm in transverse dimension. There is overlying skin thickening and subcutaneous edema seen. No sinus tract or loculated fluid collection is noted. IMPRESSION: 1. Small focal area of ulceration along the dorsal lateral aspect of the fifth metatarsal shaft. No definite evidence of acute osteomyelitis, sinus tract, or abscess. 2. Progressive chronic erosive cortical destruction seen at the fourth metatarsal head since 2019, however unchanged since June 28, 2019, which could be from chronic osteomyelitis or prior injury. 3. Small joint effusion at the fifth MTP joint. Electronically Signed By: Prudencio Pair M.D. 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 This eventually healed with IV antibiotics and hyperbaric oxygen. He was relatively well until February of this year when he developed I believe 2 wounds on the left fifth met head 1 plantar and 1 more laterally. The one more laterally has since closed over but he has been left with a fairly persistent wound in the plantar left fifth met head. Has been following with [name] of podiatry. For an extended period of time he used Medihoney. He received doxycycline and April at which time a culture showed E. coli and MSSA. An MRI done on August 02, 2019 [shown above] did not show osteomyelitis but did suggest some ongoing destruction at the fourth metatarsal head. He was switched to silver alginate as the primary dressing in May. Earlier this month he received a 2-week course  of Bactrim due to erythema around the wound. He was referred to Dr. Alfred Levins of infectious disease who did not think anything was infected and recommended stopping antibiotics. The patient said he felt really well has you finished up the Bactrim but then yesterday morning he became febrile again  and noted more erythema around the wound. He was referred here for our review of this. His temperature today is 99.3. He denies any other infectious symptoms no cough no upper respiratory, no dysuria etc. He has been using silver alginate and offloading this area with a surgical shoe. 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 He apparently had shoulder surgery in the fall 2020 I did not look this up he apparently has hardware in this area. Recent x-ray done in Dr. Pasty Arch office on 6/7 was negative for osteo- ABIs have always been noncompressible. Is not previously been thought that arterial insufficiency is contributing to this. 6/29; patient returns the clinic. I thought he might be going to see Dr. Earleen Newport ongoing for this wound but he is back today. He did see Dr. Earleen Newport on 11/22/2019 I believe he agreed with the MRI and apparently was making some arrangements to have this done in [facility] which I am fine with if they can compare with the study of March 2. In any case he returns with a clinic of the wound is not as good. He has been using silver alginate. He is still on another antibiotic that he says Dr. Earleen Newport prescribed 7/8; the patient had his MRI which showed a progressive soft tissue ulceration plantar to the fifth MTP joint with increased soft tissue enhancement throughout the small toe no abscess was noted. There was progressive signal changes in the fifth metatarsal head and fifth proximal phalanx without cortical destruction or definite abnormal T1  signal. These findings are nonspecific could be secondary to hyperemia or early osteomyelitis. He is on amoxicillin as directed by Dr. Earleen Newport. There were plans to do a bone biopsy and culture done in the OR on Friday however the patient canceled this because of insurance issues [surgical center not in [place]. In any case I have told him that this procedure in my opinion needs to be done. He is using Silvadene cream READMISSION 02/07/2020 This is a patient who had a progressive ulceration over the fifth MTP. We had him here in June and into the early July. He was also followed by [name] of podiatry. The day after we last saw him on 7/9 he went underwent a excision of the fifth MTP to aid in wound healing over this area. Pathology did not show osteomyelitis. Since then he has been following with Dr. Earleen Newport. Notable that earlier this month he presented with worse wound with purulence. CandS showed MSSA and he was treated with doxycycline and Silvadene cream. More recently he has been changed to Augmentin. He is using Betadine to the wound. He has a forefoot offloading shoe at some point in the postoperative. This wound opened in the surgical line. He is here for our review of this. With regards to his diabetes I spent some time on this today. This was diagnosed in his mid 67s he was on pills for a while and then on insulin. When he was here last time he told me that he thought he had had antipancreatic antibodies that showed he was type I if so I do not see this. He followed for a period of time with Dr. Meredith Pel endocrinology at [facility] who I gather is currently independent practice in [facility]. I have looked through her notes as it applies to what is available in care everywhere and she consistently calls  him a type II diabetic. If he had other studies of his diabetes I do not see this. I have therefore changed my label of him to type II ABI in our clinic was 1.41 on the left 9/13; arrives in  clinic today with things not looking as good. He has a small linear wound in the incision line but nonviable rolled edges and debris around the circumference. As well it looks as though he had a deep tissue injury over the base of the fifth metatarsal. Raised may be blistered but no open area here. Also concerning erythema around the wound both distally and proximally 9/20; PCR culture I did last time showed high Enterococcus faecalis, medium MRSA and 3 different anaerobic's including medium Peptostreptococcus magnus. I gave him a combination of Augmentin and trimethoprim sulfamethoxazole starting on 9/16 for 10 days. He is tolerating these well He comes in today. His original wound area at the surgical site only has a small crevice we are using endoform here. Not surprisingly the area over the base of the fifth metatarsal has open with a superficial wound. He has a wrap/surgical shoe injury dorsally on the left foot he is asking about a total contact cast and I think what I know about this area suggests that the lateral part of his foot is a weightbearing surface and I think we could certainly justify this. Objective Constitutional Patient is hypertensive.. Pulse regular and within target range for patient.Marland Kitchen Respirations regular, non-labored and within target range.. Temperature is normal and within the target range for the patient.Marland Kitchen Appears in no distress. Vitals Time Taken: 8:30 AM, Height: 76 in, Weight: 260 lbs, BMI: 31.6, Temperature: 98.2 F, Pulse: 74 bpm, Respiratory Rate: 18 breaths/min, Blood Pressure: 174/95 mmHg, Capillary Blood Glucose: 142 mg/dl. General Notes: patient stated CBG this morning was 152 Cardiovascular Pedal pulses are palpable. Musculoskeletal The patient inverts at the ankle making outside part of his foot a weightbearing surface. General Notes: Wound exam ooOver the left fifth metatarsal head a very small area is still open here. This is quite a bit better than  last week we are still you going to use endoform here. ooOver the base of the left fifth metatarsal a superficial wound breakdown I am not surprised by this it was blistered and threatening last week. No evidence of infection around this ooOver the dorsal foot there is I think a wrap injury from the surgical shoe I am not even sure this is open but certainly there was irritation in this area Integumentary (Hair, Skin) No erythema around any of the wounds. Wound #4 status is Open. Original cause of wound was Surgical Injury. The wound is located on the Left,Lateral Foot. The wound measures 0.5cm length x 0.5cm width x 0.3cm depth; 0.196cm^2 area and 0.059cm^3 volume. There is Fat Layer (Subcutaneous Tissue) exposed. There is no tunneling or undermining noted. There is a small amount of serosanguineous drainage noted. The wound margin is well defined and not attached to the wound base. There is large (67- 100%) red granulation within the wound bed. There is a small (1-33%) amount of necrotic tissue within the wound bed including Adherent Slough. Wound #5 status is Open. Original cause of wound was Gradually Appeared. The wound is located on the Left,Proximal,Lateral Foot. The wound measures 1.2cm length x 1cm width x 0.1cm depth; 0.942cm^2 area and 0.094cm^3 volume. There is Fat Layer (Subcutaneous Tissue) exposed. There is no tunneling or undermining noted. There is a small amount of serosanguineous drainage noted.  The wound margin is distinct with the outline attached to the wound base. There is large (67-100%) red, pink granulation within the wound bed. There is no necrotic tissue within the wound bed. Assessment Active Problems ICD-10 Type 2 diabetes mellitus with foot ulcer Disruption of external operation (surgical) wound, not elsewhere classified, subsequent encounter Non-pressure chronic ulcer of other part of left foot with fat layer exposed Type 2 diabetes mellitus with other skin  ulcer Plan Follow-up Appointments: Return Appointment in 1 week. Dressing Change Frequency: Wound #4 Left,Lateral Foot: Change Dressing every other day. Wound #5 Left,Proximal,Lateral Foot: Change Dressing every other day. Wound Cleansing: May shower and wash wound with soap and water. - on days that dressing is changed Primary Wound Dressing: Wound #4 Left,Lateral Foot: Endoform - moisten with normal saline Wound #5 Left,Proximal,Lateral Foot: Calcium Alginate with Silver Secondary Dressing: Wound #4 Left,Lateral Foot: Foam - top of foot for protection Kerlix/Rolled Gauze - secure with tape Dry Gauze Wound #5 Left,Proximal,Lateral Foot: Foam - top of foot for protection Kerlix/Rolled Gauze - secure with tape Dry Gauze Off-Loading: Other: - surgical shoe to left foot #1 considerably better than last week. Continue the Augmentin and trimethoprim sulfamethoxazole for the full 10 days he is used 4 days worth so far. Silver alginate to the area over the met head and foam over the dorsal foot 2. If we cannot get this area to heal he might really benefit from a total contact cast. I am quite convinced that this is a weightbearing surface. We will also need to refer him to podiatry ultimately to see about a brace or a custom made shoe to try and hold his foot off the outside part of his foot 3. The patient has Charcot-Marie-T ooth and he had may have been everting on this for a long period of time Electronic Signature(s) Signed: 02/21/2020 4:19:57 PM By: [name] MD Entered By: [name] on 02/20/2020 10:06:26 -------------------------------------------------------------------------------- SuperBill Details Patient Name: Date of Service: [facility], [place]. 11/30/1599 Medical Record Number: [id number] Patient Account Number: [id number] Date of Birth/Sex: Treating RN: Feb 25, 1966 (55 y.o. [name] Primary Care Provider: [name] Other Clinician: Referring  Provider: Treating Provider/Extender: [name], [name] in Treatment: 1 Diagnosis Coding ICD-10 Codes Code Description [phone] Type 2 diabetes mellitus with foot ulcer T81.31XD Disruption of external operation (surgical) wound, not elsewhere classified, subsequent encounter L97.522 Non-pressure chronic ulcer of other part of left foot with fat layer exposed E11.622 Type 2 diabetes mellitus with other skin ulcer Facility Procedures CPT4 Code: [id number] Description: 99213 - WOUND CARE VISIT-LEV 3 EST PT Modifier: Quantity: 1 Physician Procedures : CPT4 Code Description Modifier [id number] 99214 - WC PHYS LEVEL 4 - EST PT ICD-10 Diagnosis Description T81.31XD Disruption of external operation (surgical) wound, not elsewhere classified, subsequent encounter E11.621 Type 2 diabetes mellitus with foot  ulcer L97.522 Non-pressure chronic ulcer of other part of left foot with fat layer exposed Quantity: 1 Electronic Signature(s) Signed: 02/21/2020 4:19:57 PM By: [name] MD Entered By: [name] on 02/20/2020 10:06:50

## 2020-02-23 NOTE — Progress Notes (Signed)
Subjective: [name] is a 54 y.o. is seen today in office s/p left foot wound excision, fifth metatarsal head excision preformed on 12/09/2019.  He is followed up with the wound care center.  Wound cultures detail if pain did not grow bacteria is on antibiotics for this.  Otherwise he is the wound is healing and has been doing better since last saw him.  He has been nonweightbearing and using the knee scooter.  He is perform daily dressing changes as well.  Currently denies any fevers, chills, nausea, vomiting.  No calf pain, chest pain, shortness of breath.   Objective: General: No acute distress, AAOx3  DP/PT pulses palpable 2/4, CRT < 3 sec to all digits.  Right foot: Incision is well coapted except the central aspect is a small superficial opening and appears to be almost healed.  There is superficial area skin breakdown of the fifth metatarsal base with granular wound there is no probing, undermining or tunneling to either the areas.  There is no edema, erythema there is no drainage or pus or fluctuation or crepitation.  There is no malodor.   No other open lesions or pre-ulcerative lesions.  No pain with calf compression, swelling, warmth, erythema.   Assessment and Plan:  Ulceration left foot  -Treatment options discussed including all alternatives, risks, and complications -What is referred to the wound care center regards to the wound care at this point.  Continue antibiotics.  Discussed with him once the wound heals will further evaluate bracing options and inserts to help prevent any further ulcerations.  Discussed with him long-term we will keep on regular appointments in order to help prevent any further issues.  Vivi Barrack DPM

## 2020-02-27 ENCOUNTER — Telehealth: Payer: Self-pay | Admitting: Podiatry

## 2020-02-27 ENCOUNTER — Encounter (HOSPITAL_BASED_OUTPATIENT_CLINIC_OR_DEPARTMENT_OTHER): Payer: BLUE CROSS/BLUE SHIELD | Admitting: Internal Medicine

## 2020-02-27 DIAGNOSIS — E10622 Type 1 diabetes mellitus with other skin ulcer: Secondary | ICD-10-CM | POA: Diagnosis not present

## 2020-02-27 NOTE — Telephone Encounter (Signed)
Pt said wound care center has released him to walk and he should come in sooner than 10/5 to see you and rick since he was released sooner than expected.

## 2020-02-27 NOTE — Telephone Encounter (Signed)
Dawn- can you see if [name] has any openings? He likely needs to be fitted for a brace. Thanks!

## 2020-02-28 NOTE — Progress Notes (Signed)
[name], Erik B. ([id number]) Visit Report for 02/27/2020 Arrival Information Details Patient Name: Date of Service: [name], Erik [place] [name] 02/27/2020 8:45 A M Medical Record Number: [id number] Patient Account Number: [id number] Date of Birth/Sex: Treating RN: Oct 20, 1965 (54 y.o. Katherina RightM) Dwiggins, Shannon Primary Care Sylar Voong: Ananias PilgrimAsres, Alehegn Other Clinician: Referring Chrisean Kloth: Treating Rui Wordell/Extender: Alta Corningobson, Michael Asres, [name] in Treatment: 2 Visit Information History Since Last Visit Added or deleted any medications: No Patient Arrived: Knee Scooter Any Erik allergies or adverse reactions: No Arrival Time: 08:51 Had a fall or experienced change in No Accompanied By: self activities of daily living that may affect Transfer Assistance: None risk of falls: Patient Identification Verified: Yes Signs or symptoms of abuse/neglect since last visito No Secondary Verification Process Completed: Yes Hospitalized since last visit: No Patient Requires Transmission-Based Precautions: No Implantable device outside of the clinic excluding No Patient Has Alerts: No cellular tissue based products placed in the [name] since last visit: Has Dressing in Place as Prescribed: Yes Pain Present Now: No Electronic Signature(s) Signed: 02/27/2020 5:24:24 PM By: Cherylin Mylarwiggins, Shannon Entered By: Cherylin Mylarwiggins, Shannon on 02/27/2020 08:52:23 -------------------------------------------------------------------------------- Clinic Level of Care Assessment Details Patient Name: Date of Service: [name], Erik [name] 02/27/2020 8:45 A M Medical Record Number: [id number] Patient Account Number: [id number] Date of Birth/Sex: Treating RN: Oct 20, 1965 ([age] y.o. Elizebeth KollerM) Lynch, Shatara Primary Care Dessire Grimes: Ananias PilgrimAsres, Alehegn Other Clinician: Referring Nikkie Liming: Treating Marielys Trinidad/Extender: Alta Corningobson, Michael Asres, [name] in Treatment: 2 Clinic Level of Care Assessment Items TOOL 4 Quantity Score X- 1 0 Use when  only an EandM is performed on FOLLOW-UP visit ASSESSMENTS - Nursing Assessment / Reassessment X- 1 10 Reassessment of Co-morbidities (includes updates in patient status) X- 1 5 Reassessment of Adherence to Treatment Plan ASSESSMENTS - Wound and Skin A ssessment / Reassessment []  - 0 Simple Wound Assessment / Reassessment - one wound X- 2 5 Complex Wound Assessment / Reassessment - multiple wounds []  - 0 Dermatologic / Skin Assessment (not related to wound area) ASSESSMENTS - Focused Assessment []  - 0 Circumferential Edema Measurements - multi extremities []  - 0 Nutritional Assessment / Counseling / Intervention X- 1 5 Lower Extremity Assessment (monofilament, tuning fork, pulses) []  - 0 Peripheral Arterial Disease Assessment (using hand held doppler) ASSESSMENTS - Ostomy and/or Continence Assessment and Care []  - 0 Incontinence Assessment and Management []  - 0 Ostomy Care Assessment and Management (repouching, etc.) PROCESS - Coordination of Care X - Simple Patient / Family Education for ongoing care 1 15 []  - 0 Complex (extensive) Patient / Family Education for ongoing care X- 1 10 Staff obtains ChiropractorConsents, Records, T Results / Process Orders est []  - 0 Staff telephones HHA, Nursing Homes / Clarify orders / etc []  - 0 Routine Transfer to another Facility (non-emergent condition) []  - 0 Routine Hospital Admission (non-emergent condition) []  - 0 Erik Admissions / Manufacturing engine[employer]surance Authorizations / Ordering NPWT Apligraf, etc. , []  - 0 Emergency Hospital Admission (emergent condition) X- 1 10 Simple Discharge Coordination []  - 0 Complex (extensive) Discharge Coordination PROCESS - Special Needs []  - 0 Pediatric / Minor Patient Management []  - 0 Isolation Patient Management []  - 0 Hearing / Language / Visual special needs []  - 0 Assessment of Community assistance (transportation, D/C planning, etc.) []  - 0 Additional assistance / Altered mentation []  - 0 Support  Surface(s) Assessment (bed, cushion, seat, etc.) INTERVENTIONS - Wound Cleansing / Measurement []  - 0 Simple Wound Cleansing - one wound X- 2 5 Complex Wound Cleansing - multiple  wounds X- 1 5 Wound Imaging (photographs - any number of wounds) []  - 0 Wound Tracing (instead of photographs) []  - 0 Simple Wound Measurement - one wound X- 2 5 Complex Wound Measurement - multiple wounds INTERVENTIONS - Wound Dressings []  - 0 Small Wound Dressing one or multiple wounds X- 1 15 Medium Wound Dressing one or multiple wounds []  - 0 Large Wound Dressing one or multiple wounds []  - 0 Application of Medications - topical []  - 0 Application of Medications - injection INTERVENTIONS - Miscellaneous []  - 0 External ear exam []  - 0 Specimen Collection (cultures, biopsies, blood, body fluids, etc.) []  - 0 Specimen(s) / Culture(s) sent or taken to Lab for analysis []  - 0 Patient Transfer (multiple staff / / Similar devices) []  - 0 Simple Staple / Suture removal (25 or less) []  - 0 Complex Staple / Suture removal (26 or more) []  - 0 Hypo / Hyperglycemic Management (close monitor of Blood Glucose) []  - 0 Ankle / Brachial Index (ABI) - do not check if billed separately X- 1 5 Vital Signs Has the patient been seen at the hospital within the last three years: Yes Total Score: 110 Level Of Care: Erik/Established - Level 3 Electronic Signature(s) Signed: 02/27/2020 5:31:38 PM By: RN, BSN Entered By: on 02/27/2020 09:22:31 -------------------------------------------------------------------------------- Encounter Discharge Information Details Patient Name: Date of Service: [name], Erik [name] 02/27/2020 8:45 A M Medical Record Number: Patient Account Number: Date of Birth/Sex: Treating RN: February 23, 1966 (54 y.o. Primary Care Thelma Viana: Other Clinician: Referring Aarna Mihalko: Treating Jacorey Donaway/Extender:  in Treatment: 2 Encounter Discharge Information Items Discharge Condition: Stable Ambulatory Status: Walker Discharge Destination: Home Transportation: Private Auto Accompanied By: self Schedule Follow-up Appointment: Yes Clinical Summary of Care: Electronic Signature(s) Signed: 02/27/2020 5:26:25 PM By: 02/29/2020 Entered By: Zandra Abts on 02/27/2020 09:30:56 -------------------------------------------------------------------------------- Lower Extremity Assessment Details Patient Name: Date of Service: [name], [name] MEDICAL [name] B. 02/27/2020 8:45 A M Medical Record Number: [id number] Patient Account Number: [id number] Date of Birth/Sex: Treating RN: May 13, 1966 (54 y.o. [name] Primary Care Talaysia Pinheiro: Ananias Pilgrim Other Clinician: Referring Neil Brickell: Treating Virdell Hoiland/Extender: Alta Corning, [name] in Treatment: 2 Edema Assessment Assessed: 02/29/2020: No] Shawn Stall: No] Edema: [Left: N] [Right: o] Calf Left: Right: Point of Measurement: From Medial Instep 34 cm Ankle Left: Right: Point of Measurement: From Medial Instep 21.5 cm Vascular Assessment Pulses: Dorsalis Pedis Palpable: [Left:Yes] Electronic Signature(s) Signed: 02/27/2020 5:24:24 PM By: 02/29/2020 Entered By: [name] MEDICAL [name] on 02/27/2020 08:54:45 -------------------------------------------------------------------------------- Multi Wound Chart Details Patient Name: Date of Service: [facility], Erik [name] 02/27/2020 8:45 A M Medical Record Number: [id number] Patient Account Number: 05/11/1966 Date of Birth/Sex: Treating RN: May 20, 1966 (54 y.o. Ananias Pilgrim Primary Care Wilfredo Canterbury: [name] Other Clinician: Referring Khylei Wilms: Treating Ramonte Mena/Extender: Kyra Searles, [name] in Treatment: 2 Vital Signs Height(in): 76 Pulse(bpm): 75 Weight(lbs): 260 Blood Pressure(mmHg): 167/91 Body Mass Index(BMI):  32 Temperature(F): 98.4 Respiratory Rate(breaths/min): 18 Photos: [4:No Photos Left, Lateral Foot] [5:No Photos Left, Proximal, Lateral Foot] [N/A:N/A N/A] Wound Location: [4:Surgical Injury] [5:Gradually Appeared] [N/A:N/A] Wounding Event: [4:Diabetic Wound/Ulcer of the Lower] [5:Diabetic Wound/Ulcer of the Lower] [N/A:N/A] Primary Etiology: [4:Extremity Cataracts, Hypertension, Type I] [5:Extremity Cataracts, Hypertension, Type I] [N/A:N/A] Comorbid History: [4:Diabetes, Osteomyelitis, Neuropathy 12/09/2019] [5:Diabetes, Osteomyelitis, Neuropathy 02/20/2020] [N/A:N/A] Date Acquired: [4:2] [5:1] [N/A:N/A] Weeks of Treatment: [4:Healed - Epithelialized] [5:Open] [N/A:N/A] Wound Status: [4:0x0x0] [5:0.1x0.1x0.1] [N/A:N/A] Measurements  L x W x D (cm) [4:0] [5:0.008] [N/A:N/A] A (cm) : rea [4:0] [5:0.001] [N/A:N/A] Volume (cm) : [4:100.00%] [5:99.20%] [N/A:N/A] % Reduction in A rea: [4:100.00%] [5:98.90%] [N/A:N/A] % Reduction in Volume: [4:Grade 2] [5:Grade 2] [N/A:N/A] Classification: [4:None Present] [5:Small] [N/A:N/A] Exudate A mount: [4:N/A] [5:Serosanguineous] [N/A:N/A] Exudate Type: [4:N/A] [5:red, brown] [N/A:N/A] Exudate Color: [4:Well defined, not attached] [5:Distinct, outline attached] [N/A:N/A] Wound Margin: [4:None Present (0%)] [5:Large (67-100%)] [N/A:N/A] Granulation A mount: [4:N/A] [5:Pink] [N/A:N/A] Granulation Quality: [4:None Present (0%)] [5:None Present (0%)] [N/A:N/A] Necrotic A mount: [4:Fascia: No] [5:Fascia: No] [N/A:N/A] Exposed Structures: [4:Fat Layer (Subcutaneous Tissue): No Tendon: No Muscle: No Joint: No Bone: No Large (67-100%)] [5:Fat Layer (Subcutaneous Tissue): No Tendon: No Muscle: No Joint: No Bone: No None] [N/A:N/A] Treatment Notes Electronic Signature(s) Signed: 02/27/2020 5:31:38 PM By: Zandra Abts RN, BSN Signed: 02/28/2020 7:37:15 AM By: Baltazar Najjar MD Entered By: Baltazar Najjar on 02/27/2020  25:85:27 -------------------------------------------------------------------------------- Multi-Disciplinary Care Plan Details Patient Name: Date of Service: [name], Erik [name] 02/27/2020 8:45 A M Medical Record Number: [id number] Patient Account Number: [id number] Date of Birth/Sex: Treating RN: March 09, 1966 (54 y.o. [name] Primary Care Erling Arrazola: Ananias Pilgrim Other Clinician: Referring Tejay Hubert: Treating Kerington Hildebrant/Extender: [name], [name] in Treatment: 2 Active Inactive Wound/Skin Impairment Nursing Diagnoses: Knowledge deficit related to ulceration/compromised skin integrity Goals: Patient/caregiver will verbalize understanding of skin care regimen Date Initiated: 02/07/2020 Target Resolution Date: 03/08/2020 Goal Status: Active Ulcer/skin breakdown will have a volume reduction of 30% by week 4 Date Initiated: 02/07/2020 Target Resolution Date: 03/08/2020 Goal Status: Active Interventions: Assess patient/caregiver ability to obtain necessary supplies Assess patient/caregiver ability to perform ulcer/skin care regimen upon admission and as needed Assess ulceration(s) every visit Notes: Electronic Signature(s) Signed: 02/27/2020 5:31:38 PM By: Zandra Abts RN, BSN Entered By: Zandra Abts on 02/27/2020 09:11:03 -------------------------------------------------------------------------------- Pain Assessment Details Patient Name: Date of Service: [facility], Erik [name] 02/27/2020 8:45 A M Medical Record Number: [id number] Patient Account Number: [id number] Date of Birth/Sex: Treating RN: December 29, 1965 (54 y.o. [name] Primary Care Kelso Bibby: Ananias Pilgrim Other Clinician: Referring Azyriah Nevins: Treating Izetta Sakamoto/Extender: [name], [name] in Treatment: 2 Active Problems Location of Pain Severity and Description of Pain Patient Has Paino No Site Locations Pain Management and Medication Current Pain  Management: Electronic Signature(s) Signed: 02/27/2020 5:24:24 PM By: Cherylin Mylar Entered By: Cherylin Mylar on 02/27/2020 08:54:35 -------------------------------------------------------------------------------- Patient/Caregiver Education Details Patient Name: Date of Service: [facility], Erik Jilda Panda 9/27/2021andnbsp8:45 A M Medical Record Number: [id number] Patient Account Number: [id number] Date of Birth/Gender: Treating RN: 1966-01-16 (54 y.o. [name] Primary Care Physician: Ananias Pilgrim Other Clinician: Referring Physician: Treating Physician/Extender: Alta Corning in Treatment: 2 Education Assessment Education Provided To: Patient Education Topics Provided Wound/Skin Impairment: Methods: Explain/Verbal Responses: State content correctly Electronic Signature(s) Signed: 02/27/2020 5:31:38 PM By: Zandra Abts RN, BSN Entered By: Zandra Abts on 02/27/2020 09:11:13 -------------------------------------------------------------------------------- Wound Assessment Details Patient Name: Date of Service: [name], Erik [name] 02/27/2020 8:45 A M Medical Record Number: [id number] Patient Account Number: [id number] Date of Birth/Sex: Treating RN: 08/17/1965 (54 y.o. [name] Primary Care Delon Revelo: Ananias Pilgrim Other Clinician: Referring Anzel Kearse: Treating Ladon Vandenberghe/Extender: [name], [name] in Treatment: 2 Wound Status Wound Number: 4 Primary Diabetic Wound/Ulcer of the Lower Extremity Etiology: Wound Location: Left, Lateral Foot Wound Status: Healed - Epithelialized Wounding Event: Surgical Injury Comorbid Cataracts, Hypertension, Type I Diabetes, Osteomyelitis, Date Acquired: 12/09/2019 History: Neuropathy Weeks Of Treatment: 2 Clustered Wound:  No Wound Measurements Length: (cm) Width: (cm) Depth: (cm) Area: (cm) Volume: (cm) 0 % Reduction in Area: 100% 0 % Reduction in Volume:  100% 0 Epithelialization: Large (67-100%) 0 Tunneling: No 0 Undermining: No Wound Description Classification: Grade 2 Wound Margin: Well defined, not attached Exudate Amount: None Present Foul Odor After Cleansing: No Slough/Fibrino No Wound Bed Granulation Amount: None Present (0%) Exposed Structure Necrotic Amount: None Present (0%) Fascia Exposed: No Fat Layer (Subcutaneous Tissue) Exposed: No Tendon Exposed: No Muscle Exposed: No Joint Exposed: No Bone Exposed: No Electronic Signature(s) Signed: 02/27/2020 5:24:24 PM By: Cherylin Mylar Signed: 02/27/2020 5:31:38 PM By: Zandra Abts RN, BSN Entered By: Zandra Abts on 02/27/2020 09:09:44 -------------------------------------------------------------------------------- Wound Assessment Details Patient Name: Date of Service: [name], Erik [name] 02/27/2020 8:45 A M Medical Record Number: [id number] Patient Account Number: [id number] Date of Birth/Sex: Treating RN: 12-Mar-1966 (54 y.o. [name] Primary Care Ellise Kovack: Ananias Pilgrim Other Clinician: Referring Mayelin Panos: Treating Lylith Bebeau/Extender: [name], [name] in Treatment: 2 Wound Status Wound Number: 5 Primary Diabetic Wound/Ulcer of the Lower Extremity Etiology: Wound Location: Left, Proximal, Lateral Foot Wound Status: Open Wounding Event: Gradually Appeared Comorbid Cataracts, Hypertension, Type I Diabetes, Osteomyelitis, Date Acquired: 02/20/2020 History: Neuropathy Weeks Of Treatment: 1 Clustered Wound: No Wound Measurements Length: (cm) 0.1 Width: (cm) 0.1 Depth: (cm) 0.1 Area: (cm) 0.008 Volume: (cm) 0.001 % Reduction in Area: 99.2% % Reduction in Volume: 98.9% Epithelialization: None Tunneling: No Undermining: No Wound Description Classification: Grade 2 Wound Margin: Distinct, outline attached Exudate Amount: Small Exudate Type: Serosanguineous Exudate Color: red, brown Foul Odor After Cleansing:  No Slough/Fibrino No Wound Bed Granulation Amount: Large (67-100%) Exposed Structure Granulation Quality: Pink Fascia Exposed: No Necrotic Amount: None Present (0%) Fat Layer (Subcutaneous Tissue) Exposed: No Tendon Exposed: No Muscle Exposed: No Joint Exposed: No Bone Exposed: No Treatment Notes Wound #5 (Left, Proximal, Lateral Foot) 1. Cleanse With Wound Cleanser 2. Periwound Care Skin Prep 3. Primary Dressing Applied Foam 4. Secondary Dressing Foam 5. Secured With Medipore tape 7. Footwear/Offloading device applied Felt/Foam Surgical shoe Notes patient declined kerlix. Electronic Signature(s) Signed: 02/27/2020 5:24:24 PM By: Cherylin Mylar Entered By: Cherylin Mylar on 02/27/2020 08:58:41 -------------------------------------------------------------------------------- Vitals Details Patient Name: Date of Service: [facility], Erik [name] 02/27/2020 8:45 A M Medical Record Number: [id number] Patient Account Number: [id number] Date of Birth/Sex: Treating RN: 23-Jun-1965 (54 y.o. [name] Primary Care Zahirah Cheslock: Ananias Pilgrim Other Clinician: Referring Emalia Witkop: Treating Alaisha Eversley/Extender: [name], [name] in Treatment: 2 Vital Signs Time Taken: 08:52 Temperature (F): 98.4 Height (in): 76 Pulse (bpm): 75 Weight (lbs): 260 Respiratory Rate (breaths/min): 18 Body Mass Index (BMI): 31.6 Blood Pressure (mmHg): 167/91 Reference Range: 80 - 120 mg / dl Electronic Signature(s) Signed: 02/27/2020 5:24:24 PM By: Cherylin Mylar Entered By: Cherylin Mylar on 02/27/2020 08:54:29

## 2020-02-28 NOTE — Progress Notes (Signed)
EDWORD, CU ([id number]) Visit Report for 02/27/2020 HPI Details Patient Name: Date of Service: [facility], [place] 09/08/8117 8:45 A M Medical Record Number: [id number] Patient Account Number: [id number] Date of Birth/Sex: Treating RN: 1966/01/08 (54 y.o. [name] Primary Care Provider: [name] Other Clinician: Referring Provider: Treating Provider/Extender: [name], [name] in Treatment: 56 History of Present Illness HPI Description: ADMISSION 05/04/2018 This is a 54 year old man who apparently is a type I diabetic confirmed by appropriate serology testing 6 years ago. He is therefore on insulin. His most recent hemoglobin A1c was 10.7 on 03/27/2018. 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 As a result of a combination of this he is insensate and even has reduced sensation in his upper extremities. His current problem started in early July he noted a blister over the left fourth plantar metatarsal head. He saw [name] his podiatrist on 01/07/2018. At that time he had a wound that measured 2 x 1 x 0.4 cm. An x-ray did not show osteomyelitis. It did not appear that he actually followed up. He was admitted to hospital on 03/25/2018 through 03/30/2018 with his left foot painful and swollen. He underwent a surgical IandD by [name]. I believe his culture at that time showed Enterococcus faecalis and a bone culture showed enterococcus avium. He is been reviewed by [name] of infectious disease although I have not reviewed his note and he has been on 6 weeks worth of ertapenem which should finish sometime apparently on December 9.. MRI suggested possibility of septic arthritis but did not comment specifically on osteomyelitis. A more recent wound culture was negative. The patient has been  referred here for consideration among other things of hyperbaric oxygen versus proceeding with a partial ray amputation. This would involve excision of the fourth metatarsal head. The patient is offloading this and a Darco forefoot off loader. I think he is using iodoform packing. 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 Certainly would be a possibility of a total contact cast. I did not see any arterial studies on him however his peripheral pulses are palpable 05/11/2018; patient started his hyperbarics on Friday but did not dive yesterday for personal issues. He saw [name] on 12//19 he notes that during the hospitalization in October 2019 he had an abscess and underwent a bone biopsy with culture that showed group B strep, enterococcus avium and Enterococcus faecalis. There was Prevotella from the abscess. He was put on ertapenem for 6 weeks which he completed. Per [name] notable for the fact that he is not been treated with ampicillin or amoxicillin. His hemoglobin A1c was noted to be high at 10.7. He is started on ampicillin sulbactam every 6 again I think this is for better coverage for the enterococcus. We started him on silver alginate as there is still purulent looking drainage coming out of the wound and there is indeed exposed bone. The patient has not been unwell 05/18/2018; patient continues with hyperbarics he is tolerating this well and has no complaints. He is on Unasyn every 6. We have been using silver alginate to the area over the fourth plantar metatarsal head. He is a poorly controlled diabetic. There is certainly less purulent drainage than when I first saw this man at the beginning of the month. He only sedimentation rate I see on  him was 40 on 03/26/2018. States his blood sugars are under better control in the 120s to 130 range. Follows with his endocrinologist in [facility] I change his primary dressing to Prisma today attempting to increase the  granulation. After I left the room he called [place] back into look at a area that he noticed on the right foot 2 days ago. Very superficial and in the medial aspect of his high plantar arch [secondary to his neuropathy] he has a custom insert in the shoe on the right foot. 05/24/2018; continues with HBO doing well. He saw infectious disease today and is still on Unasyn every 6 hours. Not making a lot of progress on the left foot with Prisma I changed him to silver alginate strips. The new area on the right midfoot he is putting silver alginate on this as well. 06/01/2018; patient remains on Unasyn. Not making a lot of progress with granulation. I am going to put him in a total contact cast use Prisma for now. He has a high co-pay for an advanced treatment option but I suspect feeling in this wound may come to that 06/04/2018; patient back for the obligatory first total contact cast change. We continue to use Prisma 06/08/2018; the patient is tolerating the cast well although he had some loosening over the tibia which rubbed when he walked. There is no lesion here. We have been using Prisma under the total contact cast. He is in hyperbarics which she is tolerating well 06/15/2018; tolerating total contact cast and hyperbarics well. He continues IV antibiotics and for the underlying osteomyelitis as directed by infectious disease. His wound is making really nice progress 1/21; Patient's wound is down in depth. Using silver collagen. Ab's end this week and Pic line is to be d/ced 1/28; 0.8 cm in depth which is more than last time. We have been using silver collagen under total contact cast. He continues in hyperbarics. His IV antibiotics are finished PICC line has been removed 2/4; surprisingly today the area is actually epithelialized. There is no open area. There is however a divot where the epithelium is gone down to close the wound surface. I am not completely convinced that this is going to remain viable  over time but I think time is the best thing to determine this. There is no evidence of surrounding infection. As of today I do not think a repeat MRI or any other procedure is going to be indicative of whether this is going to maintain a closed state. I am going to continue him in a total contact cast. He will complete HBO. Next week I will allow him perhaps to graduate into a Darco forefoot off loader, then a healing sandal. I will then send him back to podiatry to see if they can fashion an offloading shoe if this area remains closed 2/10; the patient was taken out of the cast today the area is healed. He has a divot however this is fully epithelialized and the epithelialized tissue looks healthy. I have put him into a Darco forefoot off loader. He will complete his last 2 hyperbaric treatments. I will see him next week and I may transition him into a surgical shoe versus his own shoe. He is following up with [name] of podiatry and I think a diabetic shoe with custom inserts could be provided with the signature provided by his endocrinologist ADMISSION 6/22 CLINICAL DATA: Foot ulcer, question of osteomyelitis EXAM: MRI OF THE LEFT FOOT WITHOUT CONTRAST TECHNIQUE:  Multiplanar, multisequence MR imaging of the left was performed. No intravenous contrast was administered. COMPARISON: Radiograph same day FINDINGS: Bones/Joint/Cartilage Small cystic changes are seen at the fifth metatarsal head, likely degenerative changes. No marrow edema, osseous fracture, or periosteal reaction are seen throughout the osseous structures. Small joint effusion seen at the fifth MTP joint. There appears to be chronic cortical irregularity with erosive type change seen at the fourth metatarsal head which is unchanged since June 28, 2019, however new since 2019. Ligaments The Lisfranc ligaments and collateral ligaments appear to be intact. Muscles and Tendons Diffusely increased signal seen throughout  the muscles surrounding the forefoot with fatty atrophy. The flexor and extensor tendons appear to be intact. The plantar fascia is intact. Soft tissues Along the dorsal lateral aspect of the fifth metatarsal shaft there is a focal area superficial ulceration measuring approximately 8 mm in transverse dimension. There is overlying skin thickening and subcutaneous edema seen. No sinus tract or loculated fluid collection is noted. IMPRESSION: 1. Small focal area of ulceration along the dorsal lateral aspect of the fifth metatarsal shaft. No definite evidence of acute osteomyelitis, sinus tract, or abscess. 2. Progressive chronic erosive cortical destruction seen at the fourth metatarsal head since 2019, however unchanged since June 28, 2019, which could be from chronic osteomyelitis or prior injury. 3. Small joint effusion at the fifth MTP joint. Electronically Signed By: Prudencio Pair M.D. On: 08/02/2019 1:19 54 year old type I diabetic who we had in clinic in late 2019 to February 2020 with osteomyelitis of his left fourth metatarsal head and a wound in this area. This eventually healed with IV antibiotics and hyperbaric oxygen. He was relatively well until February of this year when he developed I believe 2 wounds on the left fifth met head 1 plantar and 1 more laterally. The one more laterally has since closed over but he has been left with a fairly persistent wound in the plantar left fifth met head. Has been following with [name] of podiatry. For an extended period of time he used Medihoney. He received doxycycline and April at which time a culture showed E. coli and MSSA. An MRI done on August 02, 2019 [shown above] did not show osteomyelitis but did suggest some ongoing destruction at the fourth metatarsal head. He was switched to silver alginate as the primary dressing in May. Earlier this month he received a 2-week course of Bactrim due to erythema around the wound. He was  referred to Dr. Alfred Levins of infectious disease who did not think anything was infected and recommended stopping antibiotics. The patient said he felt really well has you finished up the Bactrim but then yesterday morning he became febrile again and noted more erythema around the wound. He was referred here for our review of this. His temperature today is 99.3. He denies any other infectious symptoms no cough no upper respiratory, no dysuria etc. He has been using silver alginate and offloading this area with a surgical shoe. 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 He apparently had shoulder surgery in the fall 2020 I did not look this up he apparently has hardware in this area. Recent x-ray done in Dr. Pasty Arch office on 6/7 was negative for osteo- ABIs have always been noncompressible. Is not previously been thought that arterial insufficiency is contributing to this. 6/29; patient returns the clinic. I thought he might be going  to see Dr. Earleen Newport ongoing for this wound but he is back today. He did see Dr. Earleen Newport on 11/22/2019 I believe he agreed with the MRI and apparently was making some arrangements to have this done in [facility] which I am fine with if they can compare with the study of March 2. In any case he returns with a clinic of the wound is not as good. He has been using silver alginate. He is still on another antibiotic that he says Dr. Earleen Newport prescribed 7/8; the patient had his MRI which showed a progressive soft tissue ulceration plantar to the fifth MTP joint with increased soft tissue enhancement throughout the small toe no abscess was noted. There was progressive signal changes in the fifth metatarsal head and fifth proximal phalanx without cortical destruction or definite abnormal T1 signal. These findings are nonspecific could be secondary  to hyperemia or early osteomyelitis. He is on amoxicillin as directed by Dr. Earleen Newport. There were plans to do a bone biopsy and culture done in the OR on Friday however the patient canceled this because of insurance issues [surgical center not in [place]. In any case I have told him that this procedure in my opinion needs to be done. He is using Silvadene cream READMISSION 02/07/2020 This is a patient who had a progressive ulceration over the fifth MTP. We had him here in June and into the early July. He was also followed by [name] of podiatry. The day after we last saw him on 7/9 he went underwent a excision of the fifth MTP to aid in wound healing over this area. Pathology did not show osteomyelitis. Since then he has been following with Dr. Earleen Newport. Notable that earlier this month he presented with worse wound with purulence. CandS showed MSSA and he was treated with doxycycline and Silvadene cream. More recently he has been changed to Augmentin. He is using Betadine to the wound. He has a forefoot offloading shoe at some point in the postoperative. This wound opened in the surgical line. He is here for our review of this. With regards to his diabetes I spent some time on this today. This was diagnosed in his mid 100s he was on pills for a while and then on insulin. When he was here last time he told me that he thought he had had antipancreatic antibodies that showed he was type I if so I do not see this. He followed for a period of time with Dr. Meredith Pel endocrinology at [facility] who I gather is currently independent practice in [facility]. I have looked through her notes as it applies to what is available in care everywhere and she consistently calls him a type II diabetic. If he had other studies of his diabetes I do not see this. I have therefore changed my label of him to type II ABI in our clinic was 1.41 on the left 9/13; arrives in clinic today with things not looking as good. He has a  small linear wound in the incision line but nonviable rolled edges and debris around the circumference. As well it looks as though he had a deep tissue injury over the base of the fifth metatarsal. Raised may be blistered but no open area here. Also concerning erythema around the wound both distally and proximally 9/20; PCR culture I did last time showed high Enterococcus faecalis, medium MRSA and 3 different anaerobic's including medium Peptostreptococcus magnus. I gave him a combination of Augmentin and trimethoprim sulfamethoxazole starting  on 9/16 for 10 days. He is tolerating these well He comes in today. His original wound area at the surgical site only has a small crevice we are using endoform here. Not surprisingly the area over the base of the fifth metatarsal has open with a superficial wound. He has a wrap/surgical shoe injury dorsally on the left foot he is asking about a total contact cast and I think what I know about this area suggests that the lateral part of his foot is a weightbearing surface and I think we could certainly justify this. 9/27; patient is finishing his antibiotics. His surgical wound is actually closed the area over the base of the left fifth metatarsal of is also closed but still with some eschar. 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 As such he actually walks on the outside of his foot. He has been in touch with [facility] Liliane Channel is their orthotic specialist Electronic Signature(s) Signed: 02/28/2020 7:37:15 AM By: [name] MD Entered By: [name] on 02/27/2020 84:13:24 -------------------------------------------------------------------------------- Physical Exam Details Patient Name: Date of Service: [facility], [place] 09/01/270 8:45 A M Medical Record Number: [id number] Patient Account Number: [id number] Date of Birth/Sex: Treating RN: Oct 27, 1965 (54 y.o. [name] Primary Care Provider: [name] Other Clinician: Referring Provider: Treating Provider/Extender: [name], [name] in Treatment: 2 Constitutional Patient is hypertensive.. Pulse regular and within target range for patient.Marland Kitchen Respirations regular, non-labored and within target range.. Temperature is normal and within the target range for the patient.Marland Kitchen Appears in no distress. Cardiovascular Pedal pulses are palpable. Integumentary (Hair, Skin) No evidence of any infection in the periwound. Notes Wound exam Over the fifth metatarsal this is completely closed. Over the base of the fifth metatarsal the area has some eschared skin however I do not think there is anything open here. This is really a nice surprise. Electronic Signature(s) Signed: 02/28/2020 7:37:15 AM By: [name] MD Entered By: [name] on 02/27/2020 74:25:95 -------------------------------------------------------------------------------- Physician Orders Details Patient Name: Date of Service: [facility], [place] 6/38/7564 8:45 A M Medical Record Number: [id number] Patient Account Number: [id number] Date of Birth/Sex: Treating RN: Oct 04, 1965 (54 y.o. [name] Primary Care Provider: [name] Other Clinician: Referring Provider: Treating Provider/Extender: Elwyn Lade in Treatment: 2 Verbal / Phone Orders: No Diagnosis Coding ICD-10 Coding Code Description E11.621 Type 2 diabetes mellitus with foot ulcer T81.31XD Disruption of external operation (surgical) wound, not elsewhere classified, subsequent encounter L97.522 Non-pressure chronic ulcer of other part of left foot with fat layer exposed E11.622 Type 2 diabetes mellitus with other skin ulcer Follow-up Appointments Return Appointment in 2 weeks. Dressing Change Frequency Wound #5 Left,Proximal,Lateral Foot Change Dressing every other day. Wound Cleansing May shower and wash  wound with soap and water. - on days that dressing is changed Primary Wound Dressing Wound #5 Left,Proximal,Lateral Foot Foam Secondary Dressing Wound #5 Left,Proximal,Lateral Foot Foam - to lateral 5th met and top of foot for protection Kerlix/Rolled Gauze - secure with tape Dry Gauze Off-Loading Other: - surgical shoe to left foot Electronic Signature(s) Signed: 02/27/2020 5:31:38 PM By: [name] RN, BSN Signed: 02/28/2020 7:37:15 AM By: [name] MD Entered By: [name] on 02/27/2020 09:13:44 -------------------------------------------------------------------------------- Problem List Details Patient Name: Date of Service: [facility], [place] 1/66/0630 8:45 A M Medical Record Number: [id number] Patient Account Number: [id number] Date of Birth/Sex: Treating  RN: 1965/10/20 (54 y.o. [name] Primary Care Provider: [name] Other Clinician: Referring Provider: Treating Provider/Extender: [name], [name] in Treatment: 2 Active Problems ICD-10 Encounter Code Description Active Date MDM Diagnosis E11.621 Type 2 diabetes mellitus with foot ulcer 02/07/2020 No Yes T81.31XD Disruption of external operation (surgical) wound, not elsewhere classified, 02/07/2020 No Yes subsequent encounter L97.522 Non-pressure chronic ulcer of other part of left foot with fat layer exposed 02/07/2020 No Yes E11.622 Type 2 diabetes mellitus with other skin ulcer 02/07/2020 No Yes Inactive Problems Resolved Problems Electronic Signature(s) Signed: 02/28/2020 7:37:15 AM By: [name] MD Entered By: [name] on 02/27/2020 09:23:40 -------------------------------------------------------------------------------- Progress Note Details Patient Name: Date of Service: [facility], [place] 9/37/1696 8:45 A M Medical Record Number: [id number] Patient Account Number: [id number] Date of Birth/Sex: Treating RN: [birth year] (54 y.o. [name] Primary Care Provider: [name] Other Clinician: Referring Provider: Treating Provider/Extender: [name], [name] in Treatment: 2 Subjective History of Present Illness (HPI) ADMISSION 05/04/2018 This is a 54 year old man who apparently is a type I diabetic confirmed by appropriate serology testing 6 years ago. He is therefore on insulin. His most recent hemoglobin A1c was 10.7 on 03/27/2018. 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 As a result of a combination of this he is insensate and even has reduced sensation in his upper extremities. His current problem started in early July he noted a blister over the left fourth plantar metatarsal head. He saw [name] his podiatrist on 01/07/2018. At that time he had a wound that measured 2 x 1 x 0.4 cm. An x-ray did not show osteomyelitis. It did not appear that he actually followed up. He was admitted to hospital on 03/25/2018 through 03/30/2018 with his left foot painful and swollen. He underwent a surgical IandD by [name]. I believe his culture at that time showed Enterococcus faecalis and a bone culture showed enterococcus avium. He is been reviewed by [name] of infectious disease although I have not reviewed his note and he has been on 6 weeks worth of ertapenem which should finish sometime apparently on December 9.. MRI suggested possibility of septic arthritis but did not comment specifically on osteomyelitis. A more recent wound culture was negative. The patient has been referred here for consideration among other things of hyperbaric oxygen versus proceeding with a partial ray amputation. This would involve excision of the fourth metatarsal head. The patient is offloading this and a Darco forefoot off loader. I think he is using iodoform  packing. 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 Certainly would be a possibility of a total contact cast. I did not see any arterial studies on him however his peripheral pulses are palpable 05/11/2018; patient started his hyperbarics on Friday but did not dive yesterday for personal issues. He saw [name] on 12//19 he notes that during the hospitalization in October 2019 he had an abscess and underwent a bone biopsy with culture that showed group B strep, enterococcus avium and Enterococcus faecalis. There was Prevotella from the abscess. He was put on ertapenem for 6 weeks which he completed. Per [name] notable for the fact that he is not been treated with ampicillin or amoxicillin. His hemoglobin A1c was noted to be high at 10.7. He is started on ampicillin sulbactam every 6 again  I think this is for better coverage for the enterococcus. We started him on silver alginate as there is still purulent looking drainage coming out of the wound and there is indeed exposed bone. The patient has not been unwell 05/18/2018; patient continues with hyperbarics he is tolerating this well and has no complaints. He is on Unasyn every 6. We have been using silver alginate to the area over the fourth plantar metatarsal head. He is a poorly controlled diabetic. There is certainly less purulent drainage than when I first saw this man at the beginning of the month. He only sedimentation rate I see on him was 40 on 03/26/2018. States his blood sugars are under better control in the 120s to 130 range. Follows with his endocrinologist in [facility] I change his primary dressing to Prisma today attempting to increase the granulation. After I left the room he called [place] back into look at a area that he noticed on the right foot 2 days ago. Very superficial and in the medial aspect of his high plantar arch [secondary to his neuropathy] he has a custom insert in the shoe on the right  foot. 05/24/2018; continues with HBO doing well. He saw infectious disease today and is still on Unasyn every 6 hours. Not making a lot of progress on the left foot with Prisma I changed him to silver alginate strips. The new area on the right midfoot he is putting silver alginate on this as well. 06/01/2018; patient remains on Unasyn. Not making a lot of progress with granulation. I am going to put him in a total contact cast use Prisma for now. He has a high co-pay for an advanced treatment option but I suspect feeling in this wound may come to that 06/04/2018; patient back for the obligatory first total contact cast change. We continue to use Prisma 06/08/2018; the patient is tolerating the cast well although he had some loosening over the tibia which rubbed when he walked. There is no lesion here. We have been using Prisma under the total contact cast. He is in hyperbarics which she is tolerating well 06/15/2018; tolerating total contact cast and hyperbarics well. He continues IV antibiotics and for the underlying osteomyelitis as directed by infectious disease. His wound is making really nice progress 1/21; Patient's wound is down in depth. Using silver collagen. Ab's end this week and Pic line is to be d/ced 1/28; 0.8 cm in depth which is more than last time. We have been using silver collagen under total contact cast. He continues in hyperbarics. His IV antibiotics are finished PICC line has been removed 2/4; surprisingly today the area is actually epithelialized. There is no open area. There is however a divot where the epithelium is gone down to close the wound surface. I am not completely convinced that this is going to remain viable over time but I think time is the best thing to determine this. There is no evidence of surrounding infection. As of today I do not think a repeat MRI or any other procedure is going to be indicative of whether this is going to maintain a closed state. I am going to  continue him in a total contact cast. He will complete HBO. Next week I will allow him perhaps to graduate into a Darco forefoot off loader, then a healing sandal. I will then send him back to podiatry to see if they can fashion an offloading shoe if this area remains closed 2/10; the patient was taken  out of the cast today the area is healed. He has a divot however this is fully epithelialized and the epithelialized tissue looks healthy. I have put him into a Darco forefoot off loader. He will complete his last 2 hyperbaric treatments. I will see him next week and I may transition him into a surgical shoe versus his own shoe. He is following up with [name] of podiatry and I think a diabetic shoe with custom inserts could be provided with the signature provided by his endocrinologist ADMISSION 6/22 CLINICAL DATA: Foot ulcer, question of osteomyelitis EXAM: MRI OF THE LEFT FOOT WITHOUT CONTRAST TECHNIQUE: Multiplanar, multisequence MR imaging of the left was performed. No intravenous contrast was administered. COMPARISON: Radiograph same day FINDINGS: Bones/Joint/Cartilage Small cystic changes are seen at the fifth metatarsal head, likely degenerative changes. No marrow edema, osseous fracture, or periosteal reaction are seen throughout the osseous structures. Small joint effusion seen at the fifth MTP joint. There appears to be chronic cortical irregularity with erosive type change seen at the fourth metatarsal head which is unchanged since June 28, 2019, however new since 2019. Ligaments The Lisfranc ligaments and collateral ligaments appear to be intact. Muscles and Tendons Diffusely increased signal seen throughout the muscles surrounding the forefoot with fatty atrophy. The flexor and extensor tendons appear to be intact. The plantar fascia is intact. Soft tissues Along the dorsal lateral aspect of the fifth metatarsal shaft there is a focal area superficial ulceration  measuring approximately 8 mm in transverse dimension. There is overlying skin thickening and subcutaneous edema seen. No sinus tract or loculated fluid collection is noted. IMPRESSION: 1. Small focal area of ulceration along the dorsal lateral aspect of the fifth metatarsal shaft. No definite evidence of acute osteomyelitis, sinus tract, or abscess. 2. Progressive chronic erosive cortical destruction seen at the fourth metatarsal head since 2019, however unchanged since June 28, 2019, which could be from chronic osteomyelitis or prior injury. 3. Small joint effusion at the fifth MTP joint. Electronically Signed By: Prudencio Pair M.D. 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 This eventually healed with IV antibiotics and hyperbaric oxygen. He was relatively well until February of this year when he developed I believe 2 wounds on the left fifth met head 1 plantar and 1 more laterally. The one more laterally has since closed over but he has been left with a fairly persistent wound in the plantar left fifth met head. Has been following with [name] of podiatry. For an extended period of time he used Medihoney. He received doxycycline and April at which time a culture showed E. coli and MSSA. An MRI done on August 02, 2019 [shown above] did not show osteomyelitis but did suggest some ongoing destruction at the fourth metatarsal head. He was switched to silver alginate as the primary dressing in May. Earlier this month he received a 2-week course of Bactrim due to erythema around the wound. He was referred to Dr. Alfred Levins of infectious disease who did not think anything was infected and recommended stopping antibiotics. The patient said he felt really well has you finished up the Bactrim but then yesterday morning he became febrile again and noted more erythema  around the wound. He was referred here for our review of this. His temperature today is 99.3. He denies any other infectious symptoms no cough no upper respiratory,  no dysuria etc. He has been using silver alginate and offloading this area with a surgical shoe. 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 He apparently had shoulder surgery in the fall 2020 I did not look this up he apparently has hardware in this area. Recent x-ray done in Dr. Pasty Arch office on 6/7 was negative for osteo- ABIs have always been noncompressible. Is not previously been thought that arterial insufficiency is contributing to this. 6/29; patient returns the clinic. I thought he might be going to see Dr. Earleen Newport ongoing for this wound but he is back today. He did see Dr. Earleen Newport on 11/22/2019 I believe he agreed with the MRI and apparently was making some arrangements to have this done in [facility] which I am fine with if they can compare with the study of March 2. In any case he returns with a clinic of the wound is not as good. He has been using silver alginate. He is still on another antibiotic that he says Dr. Earleen Newport prescribed 7/8; the patient had his MRI which showed a progressive soft tissue ulceration plantar to the fifth MTP joint with increased soft tissue enhancement throughout the small toe no abscess was noted. There was progressive signal changes in the fifth metatarsal head and fifth proximal phalanx without cortical destruction or definite abnormal T1 signal. These findings are nonspecific could be secondary to hyperemia or early osteomyelitis. He is on amoxicillin as directed by Dr. Earleen Newport. There were plans to do a bone biopsy and culture done in the OR on Friday however the patient canceled this because of insurance issues [surgical center not in [place]. In any  case I have told him that this procedure in my opinion needs to be done. He is using Silvadene cream READMISSION 02/07/2020 This is a patient who had a progressive ulceration over the fifth MTP. We had him here in June and into the early July. He was also followed by [name] of podiatry. The day after we last saw him on 7/9 he went underwent a excision of the fifth MTP to aid in wound healing over this area. Pathology did not show osteomyelitis. Since then he has been following with Dr. Earleen Newport. Notable that earlier this month he presented with worse wound with purulence. CandS showed MSSA and he was treated with doxycycline and Silvadene cream. More recently he has been changed to Augmentin. He is using Betadine to the wound. He has a forefoot offloading shoe at some point in the postoperative. This wound opened in the surgical line. He is here for our review of this. With regards to his diabetes I spent some time on this today. This was diagnosed in his mid 80s he was on pills for a while and then on insulin. When he was here last time he told me that he thought he had had antipancreatic antibodies that showed he was type I if so I do not see this. He followed for a period of time with Dr. Meredith Pel endocrinology at [facility] who I gather is currently independent practice in [facility]. I have looked through her notes as it applies to what is available in care everywhere and she consistently calls him a type II diabetic. If he had other studies of his diabetes I do not see this. I have therefore changed my label of him to type II ABI in our  clinic was 1.41 on the left 9/13; arrives in clinic today with things not looking as good. He has a small linear wound in the incision line but nonviable rolled edges and debris around the circumference. As well it looks as though he had a deep tissue injury over the base of the fifth metatarsal. Raised may be blistered but no open area here. Also concerning  erythema around the wound both distally and proximally 9/20; PCR culture I did last time showed high Enterococcus faecalis, medium MRSA and 3 different anaerobic's including medium Peptostreptococcus magnus. I gave him a combination of Augmentin and trimethoprim sulfamethoxazole starting on 9/16 for 10 days. He is tolerating these well He comes in today. His original wound area at the surgical site only has a small crevice we are using endoform here. Not surprisingly the area over the base of the fifth metatarsal has open with a superficial wound. He has a wrap/surgical shoe injury dorsally on the left foot he is asking about a total contact cast and I think what I know about this area suggests that the lateral part of his foot is a weightbearing surface and I think we could certainly justify this. 9/27; patient is finishing his antibiotics. His surgical wound is actually closed the area over the base of the left fifth metatarsal of is also closed but still with some eschar. 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 As such he actually walks on the outside of his foot. He has been in touch with [facility] Liliane Channel is their orthotic specialist Objective Constitutional Patient is hypertensive.. Pulse regular and within target range for patient.Marland Kitchen Respirations regular, non-labored and within target range.. Temperature is normal and within the target range for the patient.Marland Kitchen Appears in no distress. Vitals Time Taken: 8:52 AM, Height: 76 in, Weight: 260 lbs, BMI: 31.6, Temperature: 98.4 F, Pulse: 75 bpm, Respiratory Rate: 18 breaths/min, Blood Pressure: 167/91 mmHg. Cardiovascular Pedal pulses are palpable. General Notes: Wound exam ooOver the fifth metatarsal this is completely closed. Over the base of the fifth metatarsal the area has some eschared skin however I do not think there is anything open here. This is really a nice  surprise. Integumentary (Hair, Skin) No evidence of any infection in the periwound. Wound #4 status is Healed - Epithelialized. Original cause of wound was Surgical Injury. The wound is located on the Left,Lateral Foot. The wound measures 0cm length x 0cm width x 0cm depth; 0cm^2 area and 0cm^3 volume. There is no tunneling or undermining noted. There is a none present amount of drainage noted. The wound margin is well defined and not attached to the wound base. There is no granulation within the wound bed. There is no necrotic tissue within the wound bed. Wound #5 status is Open. Original cause of wound was Gradually Appeared. The wound is located on the Left,Proximal,Lateral Foot. The wound measures 0.1cm length x 0.1cm width x 0.1cm depth; 0.008cm^2 area and 0.001cm^3 volume. There is no tunneling or undermining noted. There is a small amount of serosanguineous drainage noted. The wound margin is distinct with the outline attached to the wound base. There is large (67-100%) pink granulation within the wound bed. There is no necrotic tissue within the wound bed. Assessment Active Problems ICD-10 Type 2 diabetes mellitus with foot ulcer Disruption of external operation (surgical) wound, not elsewhere classified, subsequent encounter Non-pressure chronic ulcer of other part of left foot with  fat layer exposed Type 2 diabetes mellitus with other skin ulcer Plan Follow-up Appointments: Return Appointment in 2 weeks. Dressing Change Frequency: Wound #5 Left,Proximal,Lateral Foot: Change Dressing every other day. Wound Cleansing: May shower and wash wound with soap and water. - on days that dressing is changed Primary Wound Dressing: Wound #5 Left,Proximal,Lateral Foot: Foam Secondary Dressing: Wound #5 Left,Proximal,Lateral Foot: Foam - to lateral 5th met and top of foot for protection Kerlix/Rolled Gauze - secure with tape Dry Gauze Off-Loading: Other: - surgical shoe to left  foot #1 we will put border foam on both of these areas. I think he can go back into his normal footwear but he will need to offload these areas aggressively. He has callus pads gauze I did told him I did not think he could do too much of this 2. He will need to follow-up with Liliane Channel at [facility] with regards to a new orthotic 3. I would give him a follow-up in 2 weeks just to see everything is going. If he is still closed at that point he can be discharged.IF He feels if he feels he is closed he can cancel the appointment Electronic Signature(s) Signed: 02/28/2020 7:37:15 AM By: [name] MD Entered By: [name] on 02/27/2020 97:02:63 -------------------------------------------------------------------------------- Camp Details Patient Name: Date of Service: [facility], [place]. 7/85/8850 Medical Record Number: [id number] Patient Account Number: [id number] Date of Birth/Sex: Treating RN: Apr 29, 1966 (54 y.o. [name] Primary Care Provider: [name] Other Clinician: Referring Provider: Treating Provider/Extender: [name], [name] in Treatment: 2 Diagnosis Coding ICD-10 Codes Code Description [phone] Type 2 diabetes mellitus with foot ulcer T81.31XD Disruption of external operation (surgical) wound, not elsewhere classified, subsequent encounter L97.522 Non-pressure chronic ulcer of other part of left foot with fat layer exposed E11.622 Type 2 diabetes mellitus with other skin ulcer Facility Procedures CPT4 Code: [id number] Description: 99213 - WOUND CARE VISIT-LEV 3 EST PT Modifier: Quantity: 1 Physician Procedures : CPT4 Code Description Modifier [id number] - WC PHYS LEVEL 3 - EST PT ICD-10 Diagnosis Description T81.31XD Disruption of external operation (surgical) wound, not elsewhere classified, subsequent encounter E11.621 Type 2 diabetes mellitus with foot  ulcer L97.522 Non-pressure chronic ulcer of other part of left  foot with fat layer exposed Quantity: 1 Electronic Signature(s) Signed: 02/28/2020 7:37:15 AM By: [name] MD Entered By: [name] on 02/27/2020 09:29:19

## 2020-02-29 ENCOUNTER — Encounter (HOSPITAL_BASED_OUTPATIENT_CLINIC_OR_DEPARTMENT_OTHER): Payer: BLUE CROSS/BLUE SHIELD | Admitting: Physician Assistant

## 2020-03-06 ENCOUNTER — Encounter: Payer: BLUE CROSS/BLUE SHIELD | Admitting: Podiatry

## 2020-03-06 ENCOUNTER — Other Ambulatory Visit: Payer: BLUE CROSS/BLUE SHIELD | Admitting: Orthotics

## 2020-03-12 ENCOUNTER — Encounter (HOSPITAL_BASED_OUTPATIENT_CLINIC_OR_DEPARTMENT_OTHER): Payer: BLUE CROSS/BLUE SHIELD | Attending: Internal Medicine | Admitting: Internal Medicine

## 2020-04-10 ENCOUNTER — Telehealth: Payer: Self-pay | Admitting: Podiatry

## 2020-04-10 NOTE — Telephone Encounter (Signed)
Pt left message on 11.2 while I was out asking about a rx for braces, He is out of network with our office([facility]) and would like to go somewhere that is in network to get the braces paid for by insurance. Can you please write a rx for pt.

## 2020-04-17 NOTE — Telephone Encounter (Signed)
Left message for pt that I have the prescription ready to be picked up and to call if any questions.

## 2020-04-30 ENCOUNTER — Ambulatory Visit (INDEPENDENT_AMBULATORY_CARE_PROVIDER_SITE_OTHER): Payer: BLUE CROSS/BLUE SHIELD | Admitting: Podiatry

## 2020-04-30 ENCOUNTER — Other Ambulatory Visit: Payer: Self-pay

## 2020-04-30 DIAGNOSIS — G600 Hereditary motor and sensory neuropathy: Secondary | ICD-10-CM

## 2020-04-30 DIAGNOSIS — M216X9 Other acquired deformities of unspecified foot: Secondary | ICD-10-CM | POA: Diagnosis not present

## 2020-04-30 DIAGNOSIS — E1149 Type 2 diabetes mellitus with other diabetic neurological complication: Secondary | ICD-10-CM

## 2020-04-30 DIAGNOSIS — L97522 Non-pressure chronic ulcer of other part of left foot with fat layer exposed: Secondary | ICD-10-CM

## 2020-05-02 NOTE — Progress Notes (Signed)
Subjective: 54 year old male presents the office today for follow-up evaluation of wound on his left foot.  Since I last saw him he was referred to the wound care center and the wound has not healed. At this point we discussed options to help prevent any reoccurrence of the ulcers.  Currently denies any open sores or any swelling or redness. Denies any systemic complaints such as fevers, chills, nausea, vomiting. No acute changes since last appointment, and no other complaints at this time.   Objective: AAO x3, NAD DP/PT pulses palpable bilaterally, CRT less than 3 seconds Cavus foot type is present.  There is decreasing sensation with Semmes Weinstein monofilament.  Hyperkeratotic although minimal, lesions are present submetatarsal 5 as well as the fifth metatarsal base.  There is no ulceration identified.  There is no edema, erythema or signs of infection. No open lesions or pre-ulcerative lesions.  No pain with calf compression, swelling, warmth, erythema  Assessment: 54 year old male with healed ulceration  Plan: -All treatment options discussed with the patient including all alternatives, risks, complications.  -At this point wounds are healed.  We discussed measures to help and reoccurrence.  Due to insurance issues a prescription was given for [facility] for bracing options.  -Discussed daily foot inspection. -Patient encouraged to call the office with any questions, concerns, change in symptoms.   Vivi Barrack DPM

## 2020-06-20 ENCOUNTER — Telehealth: Payer: Self-pay | Admitting: Podiatry

## 2020-06-20 NOTE — Telephone Encounter (Signed)
He gets inserts next week and his braces in about 2.5 weeks. He has not been offloading the area. In between the original surgery and the side there is a new area that has callus with a little blood under it and states it hurts like a bruise. He is out out of town and is not able to come in until Monday as he gets back Monday. I will add him on for 11:45 Monday. Continue offloading- he has purchased pads to do this. It does not feel warm and there is no pus. No malodor.

## 2020-06-20 NOTE — Telephone Encounter (Signed)
Patient called in stating he hasn't be able to get orthotics or brace from [facility], currently has callus with open wound forming and would like to be seen on 06/25/20. Patients typically is in and out of town due to business and would be available to be seen that day. He stated he wanted to call you personally to see what could be done but didn't want to distract from others, Please Advise

## 2020-06-25 ENCOUNTER — Ambulatory Visit (INDEPENDENT_AMBULATORY_CARE_PROVIDER_SITE_OTHER): Payer: BLUE CROSS/BLUE SHIELD | Admitting: Podiatry

## 2020-06-25 ENCOUNTER — Other Ambulatory Visit: Payer: Self-pay

## 2020-06-25 DIAGNOSIS — L97522 Non-pressure chronic ulcer of other part of left foot with fat layer exposed: Secondary | ICD-10-CM | POA: Diagnosis not present

## 2020-06-25 DIAGNOSIS — M216X9 Other acquired deformities of unspecified foot: Secondary | ICD-10-CM | POA: Diagnosis not present

## 2020-06-25 DIAGNOSIS — E1149 Type 2 diabetes mellitus with other diabetic neurological complication: Secondary | ICD-10-CM

## 2020-06-25 MED ORDER — SILVER SULFADIAZINE 1 % EX CREA: 1.0000 "application " | TOPICAL_CREAM | Freq: Every day | CUTANEOUS | 0 refills | Status: DC

## 2020-06-27 NOTE — Progress Notes (Signed)
Subjective: 55 year old male presents the office today for evaluation of a wound on his left foot.  He states that this started with a callus that is opened up.  He is not able to come to the office until today as he was out of town in [place].  He discussed with his orthotics more in his brace in a couple weeks.  He denies any drainage or pus or any malodor.  No swelling or redness. Denies any systemic complaints such as fevers, chills, nausea, vomiting. No acute changes since last appointment, and no other complaints at this time.   Objective: AAO x3, NAD DP/PT pulses palpable bilaterally, CRT less than 3 seconds On the left foot plantarly between with the previous surgery and the fourth and fifth metatarsals were performed as a hyperkeratotic lesion with underlying ulceration identified.  The wound measures 0.6 x 0.5 cm with a depth of 0.2.  There is no probing, undermining or tunneling with a granular wound base.  There is no fluctuation or crepitation.  There is no malodor.  No surrounding erythema, ascending cellulitis. No pain with calf compression, swelling, warmth, erythema  Assessment: Wound left foot  Plan: -All treatment options discussed with the patient including all alternatives, risks, complications.  -I sharply debrided the wound today utilizing the #312 blade scalpel to debride nonviable devitalized tissue to promote wound healing.  The wound was debrided granular healthy tissue.  He tolerated the procedure well without any complications.  Continue Silvadene dressing changes and offloading daily.  Over the orthotics he gets to more help offload this more.  Also the braces will be helpful but he is to monitor closely for any worsening or signs or symptoms of infection. -Patient encouraged to call the office with any questions, concerns, change in symptoms.   Vivi Barrack DPM

## 2020-07-11 ENCOUNTER — Other Ambulatory Visit: Payer: Self-pay | Admitting: Podiatry

## 2020-07-11 ENCOUNTER — Telehealth: Payer: Self-pay | Admitting: Podiatry

## 2020-07-11 MED ORDER — AMOXICILLIN-POT CLAVULANATE 875-125 MG PO TABS: 1.0000 | ORAL_TABLET | Freq: Two times a day (BID) | ORAL | 0 refills | Status: DC

## 2020-07-11 NOTE — Telephone Encounter (Signed)
He has noticed that the ulcer has worsened and seen more blister and drainage. It seems to have worsened since wearing the brace.   He denies any fevers or redness. Will start antibiotics. Sent to the pharmacy. Has appt for 7:45am tomorrow.

## 2020-07-11 NOTE — Telephone Encounter (Signed)
Patient called in stating they are experiencing complications with the surgical area. Patient stated you advised them to call in to be seen asap if the condition worsens. Patient requested to be seen today or tomorrow and the only time slot available tomorrow is new patient, Please Advise

## 2020-07-12 ENCOUNTER — Other Ambulatory Visit: Payer: Self-pay

## 2020-07-12 ENCOUNTER — Ambulatory Visit (INDEPENDENT_AMBULATORY_CARE_PROVIDER_SITE_OTHER): Payer: BLUE CROSS/BLUE SHIELD | Admitting: Podiatry

## 2020-07-12 DIAGNOSIS — M216X9 Other acquired deformities of unspecified foot: Secondary | ICD-10-CM

## 2020-07-12 DIAGNOSIS — E1149 Type 2 diabetes mellitus with other diabetic neurological complication: Secondary | ICD-10-CM

## 2020-07-12 DIAGNOSIS — L97522 Non-pressure chronic ulcer of other part of left foot with fat layer exposed: Secondary | ICD-10-CM | POA: Diagnosis not present

## 2020-07-14 NOTE — Progress Notes (Signed)
Subjective: 55 year old male presents the office today for concerns of worsening wound on his left foot. He states that he did get the brace initially started to wear it.  He does admit he has been on his feet more than he should.  He is concerned because the wound is opened back up although currently not deep this is how the other wound started.  He is getting very frustrated with his foot.  He had called yesterday and I started him on Augmentin which he started last night.  Denies any systemic complaints such as fevers, chills, nausea, vomiting. No acute changes since last appointment, and no other complaints at this time.   Objective: AAO x3, NAD DP/PT pulses palpable bilaterally, CRT less than 3 seconds Decrease in station with some splinting monofilament.   Cavus foot type is present.   There is an ulceration which appears to be from an old blister submetatarsal 4 area.  There is some dried hyperkeratotic tissue present.  After debridement the superficial granular wound was identified without any probing, undermining or tunneling.  There is no surrounding erythema, ascending cellulitis there is no fluctuation capitation.  No malodor. No pain with calf compression, swelling, warmth, erythema  Assessment: Ulceration left foot  Plan: -All treatment options discussed with the patient including all alternatives, risks, complications.  -Debrided some loose hyperkeratotic tissue with any complications or bleeding.  Continue a small amount of Silvadene dressing changes daily and offloading.  He did pick up the brace this week and hope this will help further offload.  They added cushion to the brace as well to help further offload. -Finish course of Augmentin -Referral to wound care center -Monitor for any clinical signs or symptoms of infection and directed to call the office immediately should any occur or go to the ER. -Patient encouraged to call the office with any questions, concerns, change in  symptoms.   Vivi Barrack DPM

## 2020-07-26 ENCOUNTER — Other Ambulatory Visit: Payer: Self-pay

## 2020-07-26 ENCOUNTER — Ambulatory Visit (INDEPENDENT_AMBULATORY_CARE_PROVIDER_SITE_OTHER): Payer: BLUE CROSS/BLUE SHIELD | Admitting: Podiatry

## 2020-07-26 DIAGNOSIS — E1149 Type 2 diabetes mellitus with other diabetic neurological complication: Secondary | ICD-10-CM | POA: Diagnosis not present

## 2020-07-26 DIAGNOSIS — M216X9 Other acquired deformities of unspecified foot: Secondary | ICD-10-CM

## 2020-07-26 DIAGNOSIS — L97522 Non-pressure chronic ulcer of other part of left foot with fat layer exposed: Secondary | ICD-10-CM

## 2020-07-29 NOTE — Progress Notes (Signed)
Subjective: 55 year old male presents the office today for concerns of worsening wound on his left foot.  He states the wound is to about the same.  Denies any drainage or pus or any swelling.  Has not noticed any malodor.  He has noticed the wound on the back of his heel is not sure if this going up in the brace but just started.  Denies any drainage or pus coming from the area or any swelling.  He has no other concerns today. Denies any systemic complaints such as fevers, chills, nausea, vomiting. No acute changes since last appointment, and no other complaints at this time.   Has appointment with wound care center in [place] in March.  Objective: AAO x3, NAD DP/PT pulses palpable bilaterally, CRT less than 3 seconds Decrease in station with some splinting monofilament.   Cavus foot type is present.   There is an ulceration on the plantar aspect of the left foot submetatarsal 4 measuring 0.9 x 0.9 x 0.1 cm with a granular wound base without any probing, undermining or tunneling.  There is no surrounding erythema, ascending cellulitis.  There is no fluctuation crepitation.  There is no malodor.  Superficial area skin breakdown of the posterior heel which appears to be an old blister with the skin sloughed off.  No drainage or pus or signs of infection.   No pain with calf compression, swelling, warmth, erythema  Assessment: Ulceration left foot  Plan: -All treatment options discussed with the patient including all alternatives, risks, complications.  -There is no significant tissue to debride today to the wound.  Recommend use Prisma on the wound every other day which is dispensed.  If needed he can change daily if needed.  Continue offloading at all times.  I dispensed a gel Achilles sleeve for the wound on the back of the heel as well to help further offload this. -Monitor for any clinical signs or symptoms of infection and directed to call the office immediately should any occur or go to  the ER.  *We will follow up on a wound care referral to the [facility] wound care center given insurance as well.  Vivi Barrack DPM

## 2020-07-30 ENCOUNTER — Ambulatory Visit: Payer: BLUE CROSS/BLUE SHIELD | Admitting: Podiatry

## 2020-08-09 ENCOUNTER — Other Ambulatory Visit: Payer: Self-pay

## 2020-08-09 ENCOUNTER — Ambulatory Visit (INDEPENDENT_AMBULATORY_CARE_PROVIDER_SITE_OTHER): Payer: BLUE CROSS/BLUE SHIELD | Admitting: Podiatry

## 2020-08-09 VITALS — Temp 97.6°F

## 2020-08-09 DIAGNOSIS — L97522 Non-pressure chronic ulcer of other part of left foot with fat layer exposed: Secondary | ICD-10-CM

## 2020-08-09 DIAGNOSIS — E1149 Type 2 diabetes mellitus with other diabetic neurological complication: Secondary | ICD-10-CM

## 2020-08-09 MED ORDER — DOXYCYCLINE HYCLATE 100 MG PO TABS: 100.0000 mg | ORAL_TABLET | Freq: Two times a day (BID) | ORAL | 0 refills | Status: DC

## 2020-08-09 NOTE — Patient Instructions (Signed)
Follow up at the wound care center next week. If you need anything, please let me know.

## 2020-08-13 NOTE — Progress Notes (Signed)
Subjective: 55 year old male presents the office today for concerns of ulcerations left foot.  States history of the same is not getting any deeper.  Denies any redness or drainage or any swelling.  Is continue daily dressing changes.  He has a ointment scheduled with the wound care center in [place] next week. Denies any systemic complaints such as fevers, chills, nausea, vomiting. No acute changes since last appointment, and no other complaints at this time.   Has appointment with wound care center in [place] in March.  Objective: AAO x3, NAD DP/PT pulses palpable bilaterally, CRT less than 3 seconds Decrease in station with some splinting monofilament.   Cavus foot type is present.   There is an ulceration on the plantar aspect of the left foot submetatarsal 4 measuring 1 x 1 x 0.1 cm there is a granular wound base but any probing, undermining or tunneling.  Hyperkeratotic periwound with macerated periwound as well.  There is no surrounding erythema, ascending cellulitis with is no fluctuance or crepitation but there is no malodor.  No pain with calf compression, swelling, warmth, erythema  Assessment: Ulceration left foot  Plan: -All treatment options discussed with the patient including all alternatives, risks, complications.  -Sharply debrided the hyperkeratotic tissue on the periwound.  Given the macerated tissue recommended Betadine to the wound daily.  Also is prescribed doxycycline as a precaution.  Continue offloading.  He has a follow-up scheduled next week with the wound care center. -Monitor for any clinical signs or symptoms of infection and directed to call the office immediately should any occur or go to the ER.  No follow-ups on file.  Vivi Barrack DPM

## 2020-08-15 ENCOUNTER — Encounter (HOSPITAL_BASED_OUTPATIENT_CLINIC_OR_DEPARTMENT_OTHER): Payer: BLUE CROSS/BLUE SHIELD | Attending: Physician Assistant | Admitting: Physician Assistant

## 2020-08-17 ENCOUNTER — Telehealth: Payer: Self-pay | Admitting: Podiatry

## 2020-08-17 ENCOUNTER — Other Ambulatory Visit: Payer: Self-pay | Admitting: Podiatry

## 2020-08-17 MED ORDER — AMOXICILLIN-POT CLAVULANATE 875-125 MG PO TABS: 1.0000 | ORAL_TABLET | Freq: Two times a day (BID) | ORAL | 0 refills | Status: DC

## 2020-08-17 NOTE — Telephone Encounter (Signed)
I have called and spoken with the patient. He is in [place] and cannot come in. He has no warmth, redness, increased drainage but noticed swelling. He has an appointment at the [facility] on Monday. I have sent Augmentin. If any worsening over the weekend to let us know or go to the ER.

## 2020-08-17 NOTE — Telephone Encounter (Signed)
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 Has recently run out of antibiotics, and foot is becoming swollen. Patient has appointment at wound clinic 3/21, but is concerned about condition worsening before his next appointment at [facility]

## 2020-08-17 NOTE — Telephone Encounter (Signed)
Thank you :)

## 2020-08-20 ENCOUNTER — Encounter (HOSPITAL_BASED_OUTPATIENT_CLINIC_OR_DEPARTMENT_OTHER): Payer: BLUE CROSS/BLUE SHIELD | Attending: Internal Medicine | Admitting: Internal Medicine

## 2020-08-20 ENCOUNTER — Ambulatory Visit (HOSPITAL_COMMUNITY)
Admission: RE | Admit: 2020-08-20 | Discharge: 2020-08-20 | Disposition: A | Discharge status: Home / Self Care | Payer: BLUE CROSS/BLUE SHIELD | Source: Ambulatory Visit | Attending: Internal Medicine | Admitting: Internal Medicine

## 2020-08-20 ENCOUNTER — Other Ambulatory Visit (HOSPITAL_COMMUNITY): Payer: Self-pay

## 2020-08-20 ENCOUNTER — Other Ambulatory Visit: Payer: Self-pay

## 2020-08-20 DIAGNOSIS — Z794 Long term (current) use of insulin: Secondary | ICD-10-CM | POA: Insufficient documentation

## 2020-08-20 DIAGNOSIS — E10621 Type 1 diabetes mellitus with foot ulcer: Secondary | ICD-10-CM | POA: Insufficient documentation

## 2020-08-20 DIAGNOSIS — E13621 Other specified diabetes mellitus with foot ulcer: Secondary | ICD-10-CM | POA: Insufficient documentation

## 2020-08-20 DIAGNOSIS — E1042 Type 1 diabetes mellitus with diabetic polyneuropathy: Secondary | ICD-10-CM | POA: Insufficient documentation

## 2020-08-20 DIAGNOSIS — L97529 Non-pressure chronic ulcer of other part of left foot with unspecified severity: Secondary | ICD-10-CM

## 2020-08-20 DIAGNOSIS — G6181 Chronic inflammatory demyelinating polyneuritis: Secondary | ICD-10-CM | POA: Diagnosis not present

## 2020-08-20 DIAGNOSIS — L97528 Non-pressure chronic ulcer of other part of left foot with other specified severity: Secondary | ICD-10-CM | POA: Diagnosis not present

## 2020-08-20 NOTE — Progress Notes (Signed)
[name], [name] ([id number]) Visit Report for 08/20/2020 Abuse/Suicide Risk Screen Details Patient Name: Date of Service: [facility], [place] 08/20/2020 10:30 A M Medical Record Number: [id number] Patient Account Number: [id number] Date of Birth/Sex: Treating RN: [birth year] (55 y.o. [name] Primary Care [name]: [name] Other Clinician: Referring [name]: Treating [name]/Extender: [name], [name] in Treatment: 0 Abuse/Suicide Risk Screen Items Answer ABUSE RISK SCREEN: Has anyone close to you tried to hurt or harm you recentlyo No Do you feel uncomfortable with anyone in your familyo No Has anyone forced you do things that you didnt want to doo No Electronic Signature(s) Signed: 08/20/2020 4:14:31 PM By: Erik Deed RN, BSN Entered By: [name] on 08/20/2020 11:03:43 -------------------------------------------------------------------------------- Activities of Daily Living Details Patient Name: Date of Service: [facility], [name] 08/20/2020 10:30 A M Medical Record Number: [id number] Patient Account Number: [id number] Date of Birth/Sex: Treating RN: [birth year] (55 y.o. [name] Primary Care [name]: [name] Other Clinician: Referring [name]: Treating [name]/Extender: [name], [name] in Treatment: 0 Activities of Daily Living Items Answer Activities of Daily Living (Please select one for each item) Drive Automobile Completely Able T Medications ake Completely Able Use T elephone Completely Able Care for Appearance Completely Able Use T oilet Completely Able Bath / Shower Completely Able Dress Self Completely Able Feed Self Completely Able Walk Completely Able Get In / Out Bed Completely Able Housework Completely Able Prepare Meals Completely Able Handle Money Completely Able Shop for Self Completely Able Electronic Signature(s) Signed: 08/20/2020 4:14:31 PM By:  Erik Deed RN, BSN Entered By: [name] on 08/20/2020 11:04:02 -------------------------------------------------------------------------------- Education Screening Details Patient Name: Date of Service: [facility], Erik [name] 08/20/2020 10:30 A M Medical Record Number: [id number] Patient Account Number: [id number] Date of Birth/Sex: Treating RN: July 27, 1965 (55 y.o. [name] Primary Care [name]: [name] Other Clinician: Referring [name]: Treating [name]/Extender: [name] in Treatment: 0 Primary Learner Assessed: Patient Learning Preferences/Education Level/Primary Language Learning Preference: Explanation, Demonstration, Printed Material Highest Education Level: College or Above Preferred Language: English Cognitive Barrier Language Barrier: No Translator Needed: No Memory Deficit: No Emotional Barrier: No Cultural/Religious Beliefs Affecting Medical Care: No Physical Barrier Impaired Vision: Yes Contacts Impaired Hearing: No Decreased Hand dexterity: No Knowledge/Comprehension Knowledge Level: High Comprehension Level: High Ability to understand written instructions: High Ability to understand verbal instructions: High Motivation Anxiety Level: Calm Cooperation: Cooperative Education Importance: Acknowledges Need Interest in Health Problems: Asks Questions Perception: Coherent Willingness to Engage in Self-Management High Activities: Readiness to Engage in Self-Management High Activities: Electronic Signature(s) Signed: 08/20/2020 4:14:31 PM By: Erik Deed RN, BSN Entered By: [name] on 08/20/2020 11:04:41 -------------------------------------------------------------------------------- Fall Risk Assessment Details Patient Name: Date of Service: [facility], Erik [name] 08/20/2020 10:30 A M Medical Record Number: [id number] Patient Account Number: [id number] Date of Birth/Sex: Treating  RN: [birth year] (54 y.o. [name] Primary Care [name]: [name] Other Clinician: Referring [name]: Treating [name]/Extender: [name], [name] in Treatment: 0 Fall Risk Assessment Items Have you had 2 or more falls in the last 12 monthso 0 Yes Have you had any fall that resulted in injury in the last 12 monthso 0 No FALLS RISK SCREEN History of falling - immediate or within 3 months 0 No Secondary diagnosis (Do you have 2 or more medical diagnoseso) 0 No Ambulatory aid None/bed rest/wheelchair/nurse 0 Yes Crutches/cane/walker 0 No Furniture 0 No Intravenous therapy Access/Saline/Heparin Lock 0 No Gait/Transferring  Normal/ bed rest/ wheelchair 0 Yes Weak (short steps with or without shuffle, stooped but able to lift head while walking, may seek 0 No support from furniture) Impaired (short steps with shuffle, may have difficulty arising from chair, head down, impaired 0 No balance) Mental Status Oriented to own ability 0 Yes Electronic Signature(s) Signed: 08/20/2020 4:14:31 PM By: Erik Deed RN, BSN Entered By: [name] on 08/20/2020 11:05:41 -------------------------------------------------------------------------------- Foot Assessment Details Patient Name: Date of Service: [facility], Erik [name] 08/20/2020 10:30 A M Medical Record Number: [id number] Patient Account Number: [id number] Date of Birth/Sex: Treating RN: 08-06-1965 (55 y.o. [name] Primary Care [name]: [name] Other Clinician: Referring [name]: Treating [name]/Extender: [name], [name] in Treatment: 0 Foot Assessment Items Site Locations + = Sensation present, - = Sensation absent, C = Callus, U = Ulcer R = Redness, W = Warmth, M = Maceration, PU = Pre-ulcerative lesion F = Fissure, S = Swelling, D = Dryness Assessment Right: Left: Other Deformity: No Yes Prior Foot Ulcer: No Yes Prior Amputation: No  No Charcot Joint: No No Ambulatory Status: Ambulatory Without Help Gait: Steady Electronic Signature(s) Signed: 08/20/2020 4:14:31 PM By: Erik Deed RN, BSN Entered By: [name] on 08/20/2020 11:07:22 -------------------------------------------------------------------------------- Nutrition Risk Screening Details Patient Name: Date of Service: [facility], Erik [name] 08/20/2020 10:30 A M Medical Record Number: [id number] Patient Account Number: [id number] Date of Birth/Sex: Treating RN: 10/17/1965 (55 y.o. [name] Primary Care Breland Trouten: [name] Other Clinician: Referring Elain Wixon: Treating Shalayah Beagley/Extender: [name], [name] in Treatment: 0 Height (in): 76 Weight (lbs): 260 Body Mass Index (BMI): 31.6 Nutrition Risk Screening Items Score Screening NUTRITION RISK SCREEN: I have an illness or condition that made me change the kind and/or amount of food I eat 0 No I eat fewer than two meals per day 0 No I eat few fruits and vegetables, or milk products 0 No I have three or more drinks of beer, liquor or wine almost every day 0 No I have tooth or mouth problems that make it hard for me to eat 0 No I don't always have enough money to buy the food I need 0 No I eat alone most of the time 1 Yes I take three or more different prescribed or over-the-counter drugs a day 1 Yes Without wanting to, I have lost or gained 10 pounds in the last six months 0 No I am not always physically able to shop, cook and/or feed myself 0 No Nutrition Protocols Good Risk Protocol 0 No interventions needed Moderate Risk Protocol High Risk Proctocol Risk Level: Good Risk Score: 2 Electronic Signature(s) Signed: 08/20/2020 4:14:31 PM By: Erik Deed RN, BSN Entered By: [name] on 08/20/2020 11:06:09

## 2020-08-20 NOTE — Progress Notes (Signed)
MICO, SPARK ([id number]) Visit Report for 08/20/2020 Chief Complaint Document Details Patient Name: Date of Service: [facility], [place] 7/41/2878 10:30 A M Medical Record Number: [id number] Patient Account Number: [id number] Date of Birth/Sex: Treating RN: [birth year] (55 y.o. [name] Primary Care Provider: [name] Other Clinician: Referring Provider: Treating Provider/Extender: [name], [name] in Treatment: 0 Information Obtained from: Patient Chief Complaint 05/04/2018; patient is here for review of a wound over the left plantar fourth metatarsal head referred by [name] of podiatric surgery. 11/22/2019; patient is here for review of the wound on the plantar left fifth metatarsal head. She was referred by [name] 08/20/2020; patient is here for review of wound on the plantar fourth met head Electronic Signature(s) Signed: 08/20/2020 5:03:25 PM By: [name] MD Entered By: [name] on 08/20/2020 12:09:44 -------------------------------------------------------------------------------- Debridement Details Patient Name: Date of Service: [facility], [place] 0/96/2836 10:30 A M Medical Record Number: [id number] Patient Account Number: [id number] Date of Birth/Sex: Treating RN: [birth year] (56 y.o. [name] Primary Care Provider: [name] Other Clinician: Referring Provider: Treating Provider/Extender: [name], [name] in Treatment: 0 Debridement Performed for Assessment: Wound #6 Left Metatarsal head fourth Performed By: Physician [name]., MD Debridement Type: Debridement Severity of Tissue Pre Debridement: Fat layer exposed Level of Consciousness (Pre-procedure): Awake and Alert Pre-procedure Verification/Time Out Yes - 11:39 Taken: Start Time: 11:39 T Area Debrided (L x W): otal 1.5 (cm) x 1.2 (cm) = 1.8 (cm) Tissue and other material debrided: Viable, Non-Viable,  Subcutaneous Level: Skin/Subcutaneous Tissue Debridement Description: Excisional Instrument: Curette Bleeding: Minimum Hemostasis Achieved: Pressure End Time: 11:40 Procedural Pain: 0 Post Procedural Pain: 0 Response to Treatment: Procedure was tolerated well Level of Consciousness (Post- Awake and Alert procedure): Post Debridement Measurements of Total Wound Length: (cm) 1.5 Width: (cm) 1.2 Depth: (cm) 0.4 Volume: (cm) 0.565 Character of Wound/Ulcer Post Debridement: Improved Severity of Tissue Post Debridement: Fat layer exposed Post Procedure Diagnosis Same as Pre-procedure Electronic Signature(s) Signed: 08/20/2020 5:03:25 PM By: [name] MD Signed: 08/20/2020 5:12:11 PM By: [name] RN, BSN Entered By: [name] on 08/20/2020 12:09:11 -------------------------------------------------------------------------------- HPI Details Patient Name: Date of Service: [facility], [place]. 10/03/5463 10:30 A M Medical Record Number: [id number] Patient Account Number: [id number] Date of Birth/Sex: Treating RN: June 21, 1965 (55 y.o. [name] Primary Care Provider: [name] Other Clinician: Referring Provider: Treating Provider/Extender: [name], [name] in Treatment: 0 History of Present Illness HPI Description: ADMISSION 05/04/2018 This is a 55 year old man who apparently is a type I diabetic confirmed by appropriate serology testing 6 years ago. He is therefore on insulin. His most recent hemoglobin A1c was 10.7 on 03/27/2018. 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 As a result of a combination of this he is insensate and even has reduced sensation in his upper extremities. His current problem started in early July he noted a blister over the left fourth plantar metatarsal  head. He saw [name] his podiatrist on 01/07/2018. At that time he had a wound that measured 2 x 1 x 0.4 cm. An x-ray did not show osteomyelitis. It did not appear that he actually followed up. He was admitted to hospital on 03/25/2018 through 03/30/2018 with his left foot painful and swollen. He underwent a surgical IandD by [name]. I believe  his culture at that time showed Enterococcus faecalis and a bone culture showed enterococcus avium. He is been reviewed by [name] of infectious disease although I have not reviewed his note and he has been on 6 weeks worth of ertapenem which should finish sometime apparently on December 9.. MRI suggested possibility of septic arthritis but did not comment specifically on osteomyelitis. A more recent wound culture was negative. The patient has been referred here for consideration among other things of hyperbaric oxygen versus proceeding with a partial ray amputation. This would involve excision of the fourth metatarsal head. The patient is offloading this and a Darco forefoot off loader. I think he is using iodoform packing. 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 Certainly would be a possibility of a total contact cast. I did not see any arterial studies on him however his peripheral pulses are palpable 05/11/2018; patient started his hyperbarics on Friday but did not dive yesterday for personal issues. He saw [name] on 12//19 he notes that during the hospitalization in October 2019 he had an abscess and underwent a bone biopsy with culture that showed group B strep, enterococcus avium and Enterococcus faecalis. There was Prevotella from the abscess. He was put on ertapenem for 6 weeks which he completed. Per [name] notable for the fact that he is not been treated with ampicillin or amoxicillin. His hemoglobin A1c was noted to be high at 10.7. He is started on ampicillin sulbactam every 6 again I think this is for  better coverage for the enterococcus. We started him on silver alginate as there is still purulent looking drainage coming out of the wound and there is indeed exposed bone. The patient has not been unwell 05/18/2018; patient continues with hyperbarics he is tolerating this well and has no complaints. He is on Unasyn every 6. We have been using silver alginate to the area over the fourth plantar metatarsal head. He is a poorly controlled diabetic. There is certainly less purulent drainage than when I first saw this man at the beginning of the month. He only sedimentation rate I see on him was 40 on 03/26/2018. States his blood sugars are under better control in the 120s to 130 range. Follows with his endocrinologist in [facility] I change his primary dressing to Prisma today attempting to increase the granulation. After I left the room he called [place] back into look at a area that he noticed on the right foot 2 days ago. Very superficial and in the medial aspect of his high plantar arch [secondary to his neuropathy] he has a custom insert in the shoe on the right foot. 05/24/2018; continues with HBO doing well. He saw infectious disease today and is still on Unasyn every 6 hours. Not making a lot of progress on the left foot with Prisma I changed him to silver alginate strips. The new area on the right midfoot he is putting silver alginate on this as well. 06/01/2018; patient remains on Unasyn. Not making a lot of progress with granulation. I am going to put him in a total contact cast use Prisma for now. He has a high co-pay for an advanced treatment option but I suspect feeling in this wound may come to that 06/04/2018; patient back for the obligatory first total contact cast change. We continue to use Prisma 06/08/2018; the patient is tolerating the cast well although he had some loosening over the tibia which rubbed when he walked. There is no  lesion here. We have been using Prisma under the total  contact cast. He is in hyperbarics which she is tolerating well 06/15/2018; tolerating total contact cast and hyperbarics well. He continues IV antibiotics and for the underlying osteomyelitis as directed by infectious disease. His wound is making really nice progress 1/21; Patient's wound is down in depth. Using silver collagen. Ab's end this week and Pic line is to be d/ced 1/28; 0.8 cm in depth which is more than last time. We have been using silver collagen under total contact cast. He continues in hyperbarics. His IV antibiotics are finished PICC line has been removed 2/4; surprisingly today the area is actually epithelialized. There is no open area. There is however a divot where the epithelium is gone down to close the wound surface. I am not completely convinced that this is going to remain viable over time but I think time is the best thing to determine this. There is no evidence of surrounding infection. As of today I do not think a repeat MRI or any other procedure is going to be indicative of whether this is going to maintain a closed state. I am going to continue him in a total contact cast. He will complete HBO. Next week I will allow him perhaps to graduate into a Darco forefoot off loader, then a healing sandal. I will then send him back to podiatry to see if they can fashion an offloading shoe if this area remains closed 2/10; the patient was taken out of the cast today the area is healed. He has a divot however this is fully epithelialized and the epithelialized tissue looks healthy. I have put him into a Darco forefoot off loader. He will complete his last 2 hyperbaric treatments. I will see him next week and I may transition him into a surgical shoe versus his own shoe. He is following up with [name] of podiatry and I think a diabetic shoe with custom inserts could be provided with the signature provided by his endocrinologist ADMISSION 6/22 CLINICAL DATA: Foot ulcer,  question of osteomyelitis EXAM: MRI OF THE LEFT FOOT WITHOUT CONTRAST TECHNIQUE: Multiplanar, multisequence MR imaging of the left was performed. No intravenous contrast was administered. COMPARISON: Radiograph same day FINDINGS: Bones/Joint/Cartilage Small cystic changes are seen at the fifth metatarsal head, likely degenerative changes. No marrow edema, osseous fracture, or periosteal reaction are seen throughout the osseous structures. Small joint effusion seen at the fifth MTP joint. There appears to be chronic cortical irregularity with erosive type change seen at the fourth metatarsal head which is unchanged since June 28, 2019, however new since 2019. Ligaments The Lisfranc ligaments and collateral ligaments appear to be intact. Muscles and Tendons Diffusely increased signal seen throughout the muscles surrounding the forefoot with fatty atrophy. The flexor and extensor tendons appear to be intact. The plantar fascia is intact. Soft tissues Along the dorsal lateral aspect of the fifth metatarsal shaft there is a focal area superficial ulceration measuring approximately 8 mm in transverse dimension. There is overlying skin thickening and subcutaneous edema seen. No sinus tract or loculated fluid collection is noted. IMPRESSION: 1. Small focal area of ulceration along the dorsal lateral aspect of the fifth metatarsal shaft. No definite evidence of acute osteomyelitis, sinus tract, or abscess. 2. Progressive chronic erosive cortical destruction seen at the fourth metatarsal head since 2019, however unchanged since June 28, 2019, which could be from chronic osteomyelitis or prior injury. 3. Small joint effusion at the fifth MTP joint.  Electronically Signed By: Prudencio Pair M.D. On: 08/02/2019 40:64 55 year old type I diabetic who we had in clinic in late 2019 to February 2020 with osteomyelitis of his left fourth metatarsal head and a wound in this area.  This eventually healed with IV antibiotics and hyperbaric oxygen. He was relatively well until February of this year when he developed I believe 2 wounds on the left fifth met head 1 plantar and 1 more laterally. The one more laterally has since closed over but he has been left with a fairly persistent wound in the plantar left fifth met head. Has been following with [name] of podiatry. For an extended period of time he used Medihoney. He received doxycycline and April at which time a culture showed E. coli and MSSA. An MRI done on August 02, 2019 [shown above] did not show osteomyelitis but did suggest some ongoing destruction at the fourth metatarsal head. He was switched to silver alginate as the primary dressing in May. Earlier this month he received a 2-week course of Bactrim due to erythema around the wound. He was referred to Dr. Alfred Levins of infectious disease who did not think anything was infected and recommended stopping antibiotics. The patient said he felt really well has you finished up the Bactrim but then yesterday morning he became febrile again and noted more erythema around the wound. He was referred here for our review of this. His temperature today is 99.3. He denies any other infectious symptoms no cough no upper respiratory, no dysuria etc. He has been using silver alginate and offloading this area with a surgical shoe. 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 He apparently had shoulder surgery in the fall 2020 I did not look this up he apparently has hardware in this area. Recent x-ray done in Dr. Pasty Arch office on 6/7 was negative for osteo- ABIs have always been noncompressible. Is not previously been thought that arterial insufficiency is contributing to this. 6/29; patient returns the clinic. I thought he might be going to see  Dr. Earleen Newport ongoing for this wound but he is back today. He did see Dr. Earleen Newport on 11/22/2019 I believe he agreed with the MRI and apparently was making some arrangements to have this done in [facility] which I am fine with if they can compare with the study of March 2. In any case he returns with a clinic of the wound is not as good. He has been using silver alginate. He is still on another antibiotic that he says Dr. Earleen Newport prescribed 7/8; the patient had his MRI which showed a progressive soft tissue ulceration plantar to the fifth MTP joint with increased soft tissue enhancement throughout the small toe no abscess was noted. There was progressive signal changes in the fifth metatarsal head and fifth proximal phalanx without cortical destruction or definite abnormal T1 signal. These findings are nonspecific could be secondary to hyperemia or early osteomyelitis. He is on amoxicillin as directed by Dr. Earleen Newport. There were plans to do a bone biopsy and culture done in the OR on Friday however the patient canceled this because of insurance issues [surgical center not in [place]. In any case I have told him that this procedure in my opinion needs to be done. He is using Silvadene cream READMISSION 02/07/2020 This is a patient who had a progressive ulceration over the fifth MTP. We had him here  in June and into the early July. He was also followed by [name] of podiatry. The day after we last saw him on 7/9 he went underwent a excision of the fifth MTP to aid in wound healing over this area. Pathology did not show osteomyelitis. Since then he has been following with Dr. Earleen Newport. Notable that earlier this month he presented with worse wound with purulence. CandS showed MSSA and he was treated with doxycycline and Silvadene cream. More recently he has been changed to Augmentin. He is using Betadine to the wound. He has a forefoot offloading shoe at some point in the postoperative. This wound opened in the  surgical line. He is here for our review of this. With regards to his diabetes I spent some time on this today. This was diagnosed in his mid 32s he was on pills for a while and then on insulin. When he was here last time he told me that he thought he had had antipancreatic antibodies that showed he was type I if so I do not see this. He followed for a period of time with Dr. Meredith Pel endocrinology at [facility] who I gather is currently independent practice in [facility]. I have looked through her notes as it applies to what is available in care everywhere and she consistently calls him a type II diabetic. If he had other studies of his diabetes I do not see this. I have therefore changed my label of him to type II ABI in our clinic was 1.41 on the left 9/13; arrives in clinic today with things not looking as good. He has a small linear wound in the incision line but nonviable rolled edges and debris around the circumference. As well it looks as though he had a deep tissue injury over the base of the fifth metatarsal. Raised may be blistered but no open area here. Also concerning erythema around the wound both distally and proximally 9/20; PCR culture I did last time showed high Enterococcus faecalis, medium MRSA and 3 different anaerobic's including medium Peptostreptococcus magnus. I gave him a combination of Augmentin and trimethoprim sulfamethoxazole starting on 9/16 for 10 days. He is tolerating these well He comes in today. His original wound area at the surgical site only has a small crevice we are using endoform here. Not surprisingly the area over the base of the fifth metatarsal has open with a superficial wound. He has a wrap/surgical shoe injury dorsally on the left foot he is asking about a total contact cast and I think what I know about this area suggests that the lateral part of his foot is a weightbearing surface and I think we could certainly justify this. 9/27; patient is finishing  his antibiotics. His surgical wound is actually closed the area over the base of the left fifth metatarsal of is also closed but still with some eschar. 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 As such he actually walks on the outside of his foot. He has been in touch with [facility] Liliane Channel is their orthotic specialist READMISSION 08/20/2020 This is a 55 year old man who is a type II diabetic. 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 He has been in this clinic before with wounds over his left fifth and left fourth met head. He has a history of osteomyelitis in the left fifth met head requiring an excision of the fifth  MTP.. The patient tells [place] that he has an AFO brace now to correct the ankle deformity. Sometime in February he developed a callus over the fourth metatarsal head ultimately Dr. Earleen Newport open this area to expose the wound. He used Silvadene cream for a while most recently Promogran. He has had antibiotics as well including doxycycline and apparently currently Augmentin although I am not certain there is been any cultures. He states the area has not been x-rayed. 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 [name] Primary Care Provider: [name] Other Clinician: Referring Provider: Treating  Provider/Extender: [name], [name] in Treatment: 0 Constitutional Patient is hypertensive.. Pulse regular and within target range for patient.Marland Kitchen Respirations regular, non-labored and within target range.. Temperature is normal and within the target range for the patient.Marland Kitchen Appears in no distress. Respiratory work of breathing is normal. Cardiovascular Pedal pulses palpable. Neurological Insensate to the monofilament. Notes Wound exam; over the fourth metatarsal head on the left small but deep wound. At 1 point close to bone but I cannot convince myself that the bone is exposed. There is no obvious infection. Surface of the wound was completely nonviable I used a #3 curette to debride this and clean up the wound surface. Electronic Signature(s) Signed: 08/20/2020 5:03:25 PM By: [name] MD Entered By: [name] on 08/20/2020 12:23:01 -------------------------------------------------------------------------------- Physician Orders Details Patient Name: Date of Service: [facility] [place] 01/24/538 10:30 A M Medical Record Number: [id number] Patient Account Number: [id number] Date of Birth/Sex: Treating RN: 09-18-1965 (55 y.o. [name] Primary Care Provider: [name] Other Clinician: Referring Provider: Treating Provider/Extender: Elwyn Lade in Treatment: 0 Verbal / Phone Orders: No Diagnosis Coding Follow-up Appointments Return Appointment in 1 week. Bathing/ Shower/ Hygiene May shower and wash wound with soap and water. - on days that dressing is changed Off-Loading Wedge shoe to: - front offloader to left foot Wound Treatment Wound #6 - Metatarsal head fourth Wound Laterality: Left Cleanser: [place] Ancillary Kit - 15 Day Supply (DME) (Generic) Every Other Day/15 Days Discharge Instructions: Use supplies as instructed; Kit contains: (15) Saline Bullets; (15) 3x3 Gauze; 15 pr Gloves Prim Dressing:  Promogran Prisma Matrix, 4.34 (sq in) (silver collagen) (DME) (Generic) Every Other Day/15 Days ary Discharge Instructions: Moisten collagen with saline or hydrogel Secondary Dressing: Woven Gauze Sponges 2x2 in (DME) (Generic) Every Other Day/15 Days Discharge Instructions: Apply over primary dressing as directed. Secondary Dressing: Felt 2.5 yds x 5.5 in Every Other Day/15 Days Discharge Instructions: Apply over primary dressing as directed. Secured With: Child psychotherapist, Sterile 2x75 (in/in) (DME) (Generic) Every Other Day/15 Days Discharge Instructions: Secure with stretch gauze as directed. Secured With: Paper Tape, 1x10 (in/yd) (DME) (Generic) Every Other Day/15 Days Discharge Instructions: Secure dressing with tape as directed. Radiology X-ray, left foot - Non healing wound on 4th metatarsal head. ICD: E11.621 Electronic Signature(s) Signed: 08/20/2020 5:03:25 PM By: [name] MD Signed: 08/20/2020 5:12:11 PM By: [name] RN, BSN Entered By: [name] on 08/20/2020  11:47:53 Prescription 08/20/2020 -------------------------------------------------------------------------------- Theone Stanley B. [name] MD Patient Name: Provider: 01-11-1966 [id number] Date of Birth: NPI#: Jerilynn Mages [id number] Sex: DEA #: [phone] [id number] Phone #: License #: [place] Patient Address: [address], [place] [address] [place], [place] [address] [phone] Allergies lithium; Adderall; Ritalin Provider's Orders X-ray, left foot - Non healing wound on 4th metatarsal head. ICD: E11.621 Hand Signature: Date(s): Electronic Signature(s) Signed: 08/20/2020 5:03:25 PM By: [name] MD Signed: 08/20/2020 5:12:11 PM By: [name] RN, BSN Entered By: [name] on 08/20/2020 11:47:53 -------------------------------------------------------------------------------- Problem  List Details Patient Name: Date of Service: [facility], [place] 3/66/4403 10:30 A M Medical Record Number: [id number] Patient Account Number: [id number] Date of Birth/Sex: Treating RN: 01/02/1966 (55 y.o. [name] Primary Care Provider: [name] Other Clinician: Referring Provider: Treating Provider/Extender: [name], [name] in Treatment: 0 Active Problems ICD-10 Encounter Code Description Active Date MDM Diagnosis E11.621 Type 2 diabetes mellitus with foot ulcer 08/20/2020 No Yes L97.528 Non-pressure chronic ulcer of other part of left foot with other specified 08/20/2020 No Yes severity Inactive Problems Resolved Problems Electronic Signature(s) Signed: 08/20/2020 5:03:25 PM By: [name] MD Entered By: [name] on 08/20/2020 12:06:29 -------------------------------------------------------------------------------- Progress Note Details Patient Name: Date of Service: [facility], [place] 8/[birth year]33 10:30 A M Medical Record Number: [id number] Patient Account Number: [id number] Date of Birth/Sex: Treating RN: [id number] (55 y.o. [name] Primary Care Provider: [name] Other Clinician: Referring Provider: Treating Provider/Extender: [name], [name] in Treatment: 0 Subjective Chief Complaint Information obtained from Patient 05/04/2018; patient is here for review of a wound over the left plantar fourth metatarsal head referred by [name] of podiatric surgery. 11/22/2019; patient is here for review of the wound on the plantar left fifth metatarsal head. She was referred by [name] 08/20/2020; patient is here for review of wound on the plantar fourth met head History of Present Illness (HPI) ADMISSION 05/04/2018 This is a 55 year old man who apparently is a type I diabetic confirmed by appropriate serology testing 6 years ago. He is therefore on insulin. His most recent hemoglobin A1c was  10.7 on 03/27/2018. 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 As a result of a combination of this he is insensate and even has reduced sensation in his upper extremities. His current problem started in early July he noted a blister over the left fourth plantar metatarsal head. He saw [name] his podiatrist on 01/07/2018. At that time he had a wound that measured 2 x 1 x 0.4 cm. An x-ray did not show osteomyelitis. It did not appear that he actually followed up. He was admitted to hospital on 03/25/2018 through 03/30/2018 with his left foot painful and swollen. He underwent a surgical IandD by [name]. I believe his culture at that time showed Enterococcus faecalis and a bone culture showed enterococcus avium. He is been reviewed by [name] of infectious disease although I have not reviewed his note and he has been on 6 weeks worth of ertapenem which should finish sometime apparently on December 9.. MRI suggested possibility of septic arthritis but did not comment specifically on osteomyelitis. A more recent wound culture was negative. The patient has been referred here for consideration among other things of hyperbaric oxygen  versus proceeding with a partial ray amputation. This would involve excision of the fourth metatarsal head. The patient is offloading this and a Darco forefoot off loader. I think he is using iodoform packing. 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 Certainly would be a possibility of a total contact cast. I did not see any arterial studies on him however his peripheral pulses are palpable 05/11/2018; patient started his hyperbarics on Friday but did not dive yesterday for personal issues. He saw [name] on 12//19 he notes that during the hospitalization in October 2019 he had an abscess  and underwent a bone biopsy with culture that showed group B strep, enterococcus avium and Enterococcus faecalis. There was Prevotella from the abscess. He was put on ertapenem for 6 weeks which he completed. Per [name] notable for the fact that he is not been treated with ampicillin or amoxicillin. His hemoglobin A1c was noted to be high at 10.7. He is started on ampicillin sulbactam every 6 again I think this is for better coverage for the enterococcus. We started him on silver alginate as there is still purulent looking drainage coming out of the wound and there is indeed exposed bone. The patient has not been unwell 05/18/2018; patient continues with hyperbarics he is tolerating this well and has no complaints. He is on Unasyn every 6. We have been using silver alginate to the area over the fourth plantar metatarsal head. He is a poorly controlled diabetic. There is certainly less purulent drainage than when I first saw this man at the beginning of the month. He only sedimentation rate I see on him was 40 on 03/26/2018. States his blood sugars are under better control in the 120s to 130 range. Follows with his endocrinologist in [facility] I change his primary dressing to Prisma today attempting to increase the granulation. After I left the room he called [place] back into look at a area that he noticed on the right foot 2 days ago. Very superficial and in the medial aspect of his high plantar arch [secondary to his neuropathy] he has a custom insert in the shoe on the right foot. 05/24/2018; continues with HBO doing well. He saw infectious disease today and is still on Unasyn every 6 hours. Not making a lot of progress on the left foot with Prisma I changed him to silver alginate strips. The new area on the right midfoot he is putting silver alginate on this as well. 06/01/2018; patient remains on Unasyn. Not making a lot of progress with granulation. I am going to put him in a total contact cast  use Prisma for now. He has a high co-pay for an advanced treatment option but I suspect feeling in this wound may come to that 06/04/2018; patient back for the obligatory first total contact cast change. We continue to use Prisma 06/08/2018; the patient is tolerating the cast well although he had some loosening over the tibia which rubbed when he walked. There is no lesion here. We have been using Prisma under the total contact cast. He is in hyperbarics which she is tolerating well 06/15/2018; tolerating total contact cast and hyperbarics well. He continues IV antibiotics and for the underlying osteomyelitis as directed by infectious disease. His wound is making really nice progress 1/21; Patient's wound is down in depth. Using silver collagen. Ab's end this week and Pic line is to be d/ced 1/28; 0.8 cm in depth which is more than last time. We  have been using silver collagen under total contact cast. He continues in hyperbarics. His IV antibiotics are finished PICC line has been removed 2/4; surprisingly today the area is actually epithelialized. There is no open area. There is however a divot where the epithelium is gone down to close the wound surface. I am not completely convinced that this is going to remain viable over time but I think time is the best thing to determine this. There is no evidence of surrounding infection. As of today I do not think a repeat MRI or any other procedure is going to be indicative of whether this is going to maintain a closed state. I am going to continue him in a total contact cast. He will complete HBO. Next week I will allow him perhaps to graduate into a Darco forefoot off loader, then a healing sandal. I will then send him back to podiatry to see if they can fashion an offloading shoe if this area remains closed 2/10; the patient was taken out of the cast today the area is healed. He has a divot however this is fully epithelialized and the epithelialized tissue  looks healthy. I have put him into a Darco forefoot off loader. He will complete his last 2 hyperbaric treatments. I will see him next week and I may transition him into a surgical shoe versus his own shoe. He is following up with [name] of podiatry and I think a diabetic shoe with custom inserts could be provided with the signature provided by his endocrinologist ADMISSION 6/22 CLINICAL DATA: Foot ulcer, question of osteomyelitis EXAM: MRI OF THE LEFT FOOT WITHOUT CONTRAST TECHNIQUE: Multiplanar, multisequence MR imaging of the left was performed. No intravenous contrast was administered. COMPARISON: Radiograph same day FINDINGS: Bones/Joint/Cartilage Small cystic changes are seen at the fifth metatarsal head, likely degenerative changes. No marrow edema, osseous fracture, or periosteal reaction are seen throughout the osseous structures. Small joint effusion seen at the fifth MTP joint. There appears to be chronic cortical irregularity with erosive type change seen at the fourth metatarsal head which is unchanged since June 28, 2019, however new since 2019. Ligaments The Lisfranc ligaments and collateral ligaments appear to be intact. Muscles and Tendons Diffusely increased signal seen throughout the muscles surrounding the forefoot with fatty atrophy. The flexor and extensor tendons appear to be intact. The plantar fascia is intact. Soft tissues Along the dorsal lateral aspect of the fifth metatarsal shaft there is a focal area superficial ulceration measuring approximately 8 mm in transverse dimension. There is overlying skin thickening and subcutaneous edema seen. No sinus tract or loculated fluid collection is noted. IMPRESSION: 1. Small focal area of ulceration along the dorsal lateral aspect of the fifth metatarsal shaft. No definite evidence of acute osteomyelitis, sinus tract, or abscess. 2. Progressive chronic erosive cortical destruction seen at the fourth  metatarsal head since 2019, however unchanged since June 28, 2019, which could be from chronic osteomyelitis or prior injury. 3. Small joint effusion at the fifth MTP joint. Electronically Signed By: Prudencio Pair M.D. On: 08/02/2019 49:90 55 year old type I diabetic who we had in clinic in late 2019 to February 2020 with osteomyelitis of his left fourth metatarsal head and a wound in this area. This eventually healed with IV antibiotics and hyperbaric oxygen. He was relatively well until February of this year when he developed I believe 2 wounds on the left fifth met head 1 plantar and 1 more laterally. The one more laterally has since closed over  but he has been left with a fairly persistent wound in the plantar left fifth met head. Has been following with Dr. Ardelle Anton of podiatry. For an extended period of time he used Medihoney. He received doxycycline and April at which time a culture showed E. coli and MSSA. An MRI done on August 02, 2019 [shown above] did not show osteomyelitis but did suggest some ongoing destruction at the fourth metatarsal head. He was switched to silver alginate as the primary dressing in May. Earlier this month he received a 2-week course of Bactrim due to erythema around the wound. He was referred to Dr. Faythe Ghee of infectious disease who did not think anything was infected and recommended stopping antibiotics. The patient said he felt really well has you finished up the Bactrim but then yesterday morning he became febrile again and noted more erythema around the wound. He was referred here for our review of this. His temperature today is 99.3. He denies any other infectious symptoms no cough no upper respiratory, no dysuria etc. He has been using silver alginate and offloading this area with a surgical shoe. 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 He apparently had shoulder surgery in the fall 2020 I did not look this up he apparently has hardware in this area. Recent x-ray done in Dr. Kenna Gilbert office on 6/7 was negative for osteo- ABIs have always been noncompressible. Is not previously been thought that arterial insufficiency is contributing to this. 6/29; patient returns the clinic. I thought he might be going to see Dr. Loreta Ave ongoing for this wound but he is back today. He did see Dr. Loreta Ave on 11/22/2019 I believe he agreed with the MRI and apparently was making some arrangements to have this done in [facility] which I am fine with if they can compare with the study of March 2. In any case he returns with a clinic of the wound is not as good. He has been using silver alginate. He is still on another antibiotic that he says Dr. Loreta Ave prescribed 7/8; the patient had his MRI which showed a progressive soft tissue ulceration plantar to the fifth MTP joint with increased soft tissue enhancement throughout the small toe no abscess was noted. There was progressive signal changes in the fifth metatarsal head and fifth proximal phalanx without cortical destruction or definite abnormal T1 signal. These findings are nonspecific could be secondary to hyperemia or early osteomyelitis. He is on amoxicillin as directed by Dr. Loreta Ave. There were plans to do a bone biopsy and culture done in the OR on Friday however the patient canceled this because of insurance issues [surgical center not in networko]. In any case I have told him that this procedure in my opinion needs to be done. He is using Silvadene cream READMISSION 02/07/2020 This is a patient who had a progressive ulceration over the fifth MTP. We had him here in June and into the early July. He was also followed by Dr. Ardelle Anton of podiatry. The day after we last saw him on 7/9 he went underwent a excision of the fifth MTP to aid in wound healing over this area. Pathology did not  show osteomyelitis. Since then he has been following with Dr. Loreta Ave. Notable that earlier this month he presented with worse wound with purulence. CandS showed MSSA and he was treated with doxycycline and Silvadene cream. More recently he  has been changed to Augmentin. He is using Betadine to the wound. He has a forefoot offloading shoe at some point in the postoperative. This wound opened in the surgical line. He is here for our review of this. With regards to his diabetes I spent some time on this today. This was diagnosed in his mid 63s he was on pills for a while and then on insulin. When he was here last time he told me that he thought he had had antipancreatic antibodies that showed he was type I if so I do not see this. He followed for a period of time with Dr. Meredith Pel endocrinology at [facility] who I gather is currently independent practice in [facility]. I have looked through her notes as it applies to what is available in care everywhere and she consistently calls him a type II diabetic. If he had other studies of his diabetes I do not see this. I have therefore changed my label of him to type II ABI in our clinic was 1.41 on the left 9/13; arrives in clinic today with things not looking as good. He has a small linear wound in the incision line but nonviable rolled edges and debris around the circumference. As well it looks as though he had a deep tissue injury over the base of the fifth metatarsal. Raised may be blistered but no open area here. Also concerning erythema around the wound both distally and proximally 9/20; PCR culture I did last time showed high Enterococcus faecalis, medium MRSA and 3 different anaerobic's including medium Peptostreptococcus magnus. I gave him a combination of Augmentin and trimethoprim sulfamethoxazole starting on 9/16 for 10 days. He is tolerating these well He comes in today. His original wound area at the surgical site only has a small crevice we are  using endoform here. Not surprisingly the area over the base of the fifth metatarsal has open with a superficial wound. He has a wrap/surgical shoe injury dorsally on the left foot he is asking about a total contact cast and I think what I know about this area suggests that the lateral part of his foot is a weightbearing surface and I think we could certainly justify this. 9/27; patient is finishing his antibiotics. His surgical wound is actually closed the area over the base of the left fifth metatarsal of is also closed but still with some eschar. 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 As such he actually walks on the outside of his foot. He has been in touch with [facility] Liliane Channel is their orthotic specialist READMISSION 08/20/2020 This is a 55 year old man who is a type II diabetic. 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 He has been in this clinic before with wounds over his left fifth and left fourth met head. He has a history of osteomyelitis in the left fifth met head requiring an excision of the fifth MTP.. The patient tells [place] that he has an AFO brace now to correct the ankle deformity. Sometime in February he developed a callus over the fourth metatarsal head ultimately Dr. Earleen Newport open this area to expose the wound. He used Silvadene cream for a while most recently Promogran. He has had antibiotics as well including doxycycline and apparently currently Augmentin although I am not certain there is been any cultures. He states the area has not been x-rayed. 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 Allergies lithium (Severity: Moderate, Reaction: intolerance- uncomfortable  feeling), Adderall (Reaction: palpitations), Ritalin (Reaction: palpitations) Family History Cancer - Mother, Diabetes - Maternal Grandparents,Father, Heart Disease - Father, No family history of Hereditary Spherocytosis, Hypertension, Kidney Disease, Lung Disease, Seizures, Stroke, Thyroid Problems, Tuberculosis. Social History Former smoker - quit 30 years ago, Marital Status - Divorced, Alcohol Use - Rarely, Drug Use - No History, Caffeine Use - Daily - coffee. Medical History Eyes Patient has history of Cataracts - right eye removed Denies history of Glaucoma, Optic Neuritis Ear/Nose/Mouth/Throat Denies history of Chronic sinus problems/congestion, Middle ear problems Hematologic/Lymphatic Denies history of Anemia, Hemophilia, Human Immunodeficiency Virus, Lymphedema, Sickle Cell Disease Respiratory Denies history of Aspiration, Asthma, Chronic Obstructive Pulmonary Disease (COPD), Pneumothorax, Sleep Apnea, Tuberculosis Cardiovascular Patient has history of Hypertension Denies history of Angina, Arrhythmia, Congestive Heart Failure, Coronary Artery Disease, Deep Vein Thrombosis, Hypotension, Myocardial Infarction, Peripheral Arterial Disease, Peripheral Venous Disease, Phlebitis, Vasculitis Gastrointestinal Denies history of Cirrhosis , Colitis, Crohnoos, Hepatitis A, Hepatitis B, Hepatitis C Endocrine Patient has history of Type II Diabetes Denies history of Type I Diabetes Genitourinary Denies history of End Stage Renal Disease Immunological Denies history of Lupus Erythematosus, Raynaudoos, Scleroderma Integumentary (Skin) Denies history of History of Burn Musculoskeletal Patient has history of Osteomyelitis - left foot Denies history of Gout, Rheumatoid Arthritis, Osteoarthritis Neurologic Patient has history of Neuropathy Denies history of Dementia, Quadriplegia, Paraplegia, Seizure Disorder Oncologic Denies history of Received Chemotherapy, Received  Radiation Psychiatric Denies history of Anorexia/bulimia, Confinement Anxiety Hospitalization/Surgery History - IandD with bone biopsy. - rhinoplasty. - bone spur removed right elbow. - cervical C4-C5 sx. - right shoulder surgery. - left hand surgery. Medical A Surgical History Notes nd Eyes detach retina left eye in 2005 resulting in wearing a contact. Cardiovascular hyperlipidemia Gastrointestinal hx pancreatitis, hx stomach ulcer, esophagitis Neurologic 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 Psychiatric ADHD Review of Systems (ROS) Constitutional Symptoms (General Health) Denies complaints or symptoms of Fatigue, Fever, Chills, Marked Weight Change. Eyes Complains or has symptoms of Glasses / Contacts. Ear/Nose/Mouth/Throat Denies complaints or symptoms of Chronic sinus problems or rhinitis. Respiratory Denies complaints or symptoms of Chronic or frequent coughs, Shortness of Breath. Cardiovascular Denies complaints or symptoms of Chest pain. Gastrointestinal Denies complaints or symptoms of Frequent diarrhea, Nausea, Vomiting. Genitourinary Denies complaints or symptoms of Frequent urination. Integumentary (Skin) Complains or has symptoms of Wounds - left foot. Musculoskeletal Denies complaints or symptoms of Muscle Pain, Muscle Weakness. Neurologic Complains or has symptoms of Numbness/parasthesias. Psychiatric Denies complaints or symptoms of Claustrophobia, Suicidal. Objective Constitutional Patient is hypertensive.. Pulse regular and within target range for patient.Marland Kitchen Respirations regular, non-labored and within target range.. Temperature is normal and within the target range for the patient.Marland Kitchen Appears in no distress. Vitals Time Taken: 10:49 AM, Height: 76 in, Source: Stated, Weight: 260 lbs, Source: Stated, BMI: 31.6, Temperature: 98.4 F, Pulse: 84 bpm, Respiratory Rate: 18 breaths/min, Blood Pressure: 152/79 mmHg, Capillary Blood Glucose: 175 mg/dl. General  Notes: glucose per pt report last night Respiratory work of breathing is normal. Cardiovascular Pedal pulses palpable. Neurological Insensate to the monofilament. General Notes: Wound exam; over the fourth metatarsal head on the left small but deep wound. At 1 point close to bone but I cannot convince myself that the bone is exposed. There  is no obvious infection. Surface of the wound was completely nonviable I used a #3 curette to debride this and clean up the wound surface. Integumentary (Hair, Skin) Wound #6 status is Open. Original cause of wound was Gradually Appeared. The date acquired was: 07/03/2020. The wound is located on the Left Metatarsal head fourth. The wound measures 1.5cm length x 1.2cm width x 0.4cm depth; 1.414cm^2 area and 0.565cm^3 volume. There is Fat Layer (Subcutaneous Tissue) exposed. There is no tunneling or undermining noted. There is a medium amount of serosanguineous drainage noted. The wound margin is distinct with the outline attached to the wound base. There is large (67-100%) pink, pale granulation within the wound bed. There is a small (1-33%) amount of necrotic tissue within the wound bed including Adherent Slough. Assessment Active Problems ICD-10 Type 2 diabetes mellitus with foot ulcer Non-pressure chronic ulcer of other part of left foot with other specified severity Procedures Wound #6 Pre-procedure diagnosis of Wound #6 is a Diabetic Wound/Ulcer of the Lower Extremity located on the Left Metatarsal head fourth .Severity of Tissue Pre Debridement is: Fat layer exposed. There was a Excisional Skin/Subcutaneous Tissue Debridement with a total area of 1.8 sq cm performed by [name]., MD. With the following instrument(s): Curette to remove Viable and Non-Viable tissue/material. Material removed includes Subcutaneous Tissue. No specimens were taken. A time out was conducted at 11:39, prior to the start of the procedure. A Minimum amount of bleeding  was controlled with Pressure. The procedure was tolerated well with a pain level of 0 throughout and a pain level of 0 following the procedure. Post Debridement Measurements: 1.5cm length x 1.2cm width x 0.4cm depth; 0.565cm^3 volume. Character of Wound/Ulcer Post Debridement is improved. Severity of Tissue Post Debridement is: Fat layer exposed. Post procedure Diagnosis Wound #6: Same as Pre-Procedure Plan Follow-up Appointments: Return Appointment in 1 week. Bathing/ Shower/ Hygiene: May shower and wash wound with soap and water. - on days that dressing is changed Off-Loading: Wedge shoe to: - front offloader to left foot Radiology ordered were: X-ray, left foot - Non healing wound on 4th metatarsal head. ICD: E11.621 WOUND #6: - Metatarsal head fourth Wound Laterality: Left Cleanser: Byram Ancillary Kit - 15 Day Supply (DME) (Generic) Every Other Day/15 Days Discharge Instructions: Use supplies as instructed; Kit contains: (15) Saline Bullets; (15) 3x3 Gauze; 15 pr Gloves Prim Dressing: Promogran Prisma Matrix, 4.34 (sq in) (silver collagen) (DME) (Generic) Every Other Day/15 Days ary Discharge Instructions: Moisten collagen with saline or hydrogel Secondary Dressing: Woven Gauze Sponges 2x2 in (DME) (Generic) Every Other Day/15 Days Discharge Instructions: Apply over primary dressing as directed. Secondary Dressing: Felt 2.5 yds x 5.5 in Every Other Day/15 Days Discharge Instructions: Apply over primary dressing as directed. Secured With: Child psychotherapist, Sterile 2x75 (in/in) (DME) (Generic) Every Other Day/15 Days Discharge Instructions: Secure with stretch gauze as directed. Secured With: Paper T ape, 1x10 (in/yd) (DME) (Generic) Every Other Day/15 Days Discharge Instructions: Secure dressing with tape as directed. 1. The wound is small in terms of surface area about 4 mm of depth. This is quite a bit worse than what Dr. Earleen Newport described is 0.1 cm depth during a  visit on 3/10. 2. I will continue with the collagen but moved to silver collagen-based dressings 3. I have asked for an x-ray of the foot. 4. The patient is active on his feet. It is quite likely he will need a total contact cast and I told him to come  prepared for this next week assuming his x-ray is negative 5. I saw no evidence of infection. No cultures or antibiotics were felt to be currently necessary although he may be on antibiotics currently.o Augmentin Electronic Signature(s) Signed: 08/20/2020 5:03:25 PM By: [name] MD Entered By: [name] on 08/20/2020 12:25:02 -------------------------------------------------------------------------------- HxROS Details Patient Name: Date of Service: [facility], [place] 1/85/6314 10:30 A M Medical Record Number: [id number] Patient Account Number: [id number] Date of Birth/Sex: Treating RN: 1965-10-20 (55 y.o. Ernestene Mention Primary Care Provider: [name] Other Clinician: Referring Provider: Treating Provider/Extender: Elwyn Lade in Treatment: 0 Information Obtained From Patient Constitutional Symptoms (General Health) Complaints and Symptoms: Negative for: Fatigue; Fever; Chills; Marked Weight Change Eyes Complaints and Symptoms: Positive for: Glasses / Contacts Medical History: Positive for: Cataracts - right eye removed Negative for: Glaucoma; Optic Neuritis Past Medical History Notes: detach retina left eye in 2005 resulting in wearing a contact. Ear/Nose/Mouth/Throat Complaints and Symptoms: Negative for: Chronic sinus problems or rhinitis Medical History: Negative for: Chronic sinus problems/congestion; Middle ear problems Respiratory Complaints and Symptoms: Negative for: Chronic or frequent coughs; Shortness of Breath Medical History: Negative for: Aspiration; Asthma; Chronic Obstructive Pulmonary Disease (COPD); Pneumothorax; Sleep Apnea;  Tuberculosis Cardiovascular Complaints and Symptoms: Negative for: Chest pain Medical History: Positive for: Hypertension Negative for: Angina; Arrhythmia; Congestive Heart Failure; Coronary Artery Disease; Deep Vein Thrombosis; Hypotension; Myocardial Infarction; Peripheral Arterial Disease; Peripheral Venous Disease; Phlebitis; Vasculitis Past Medical History Notes: hyperlipidemia Gastrointestinal Complaints and Symptoms: Negative for: Frequent diarrhea; Nausea; Vomiting Medical History: Negative for: Cirrhosis ; Colitis; Crohns; Hepatitis A; Hepatitis B; Hepatitis C Past Medical History Notes: hx pancreatitis, hx stomach ulcer, esophagitis Genitourinary Complaints and Symptoms: Negative for: Frequent urination Medical History: Negative for: End Stage Renal Disease Integumentary (Skin) Complaints and Symptoms: Positive for: Wounds - left foot Medical History: Negative for: History of Burn Musculoskeletal Complaints and Symptoms: Negative for: Muscle Pain; Muscle Weakness Medical History: Positive for: Osteomyelitis - left foot Negative for: Gout; Rheumatoid Arthritis; Osteoarthritis Neurologic Complaints and Symptoms: Positive for: Numbness/parasthesias Medical History: Positive for: Neuropathy Negative for: Dementia; Quadriplegia; Paraplegia; Seizure Disorder Past Medical History Notes: 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 Psychiatric Complaints and Symptoms: Negative for: Claustrophobia; Suicidal Medical History: Negative for: Anorexia/bulimia; Confinement Anxiety Past Medical History Notes: ADHD Hematologic/Lymphatic Medical History: Negative for: Anemia; Hemophilia; Human Immunodeficiency Virus; Lymphedema; Sickle Cell Disease Endocrine Medical History: Positive for: Type II Diabetes Negative for: Type I Diabetes Time with diabetes: 9 years Treated with: Insulin Blood sugar tested every day: Yes Tested : 4-5 times per day Immunological Medical  History: Negative for: Lupus Erythematosus; Raynauds; Scleroderma Oncologic Medical History: Negative for: Received Chemotherapy; Received Radiation HBO Extended History Items Eyes: Cataracts Immunizations Pneumococcal Vaccine: Received Pneumococcal Vaccination: No Implantable Devices None Hospitalization / Surgery History Type of Hospitalization/Surgery IandD with bone biopsy rhinoplasty bone spur removed right elbow cervical C4-C5 sx right shoulder surgery left hand surgery Family and Social History Cancer: Yes - Mother; Diabetes: Yes - Maternal Grandparents,Father; Heart Disease: Yes - Father; Hereditary Spherocytosis: No; Hypertension: No; Kidney Disease: No; Lung Disease: No; Seizures: No; Stroke: No; Thyroid Problems: No; Tuberculosis: No; Former smoker - quit 30 years ago; Marital Status - Divorced; Alcohol Use: Rarely; Drug Use: No History; Caffeine Use: Daily - coffee; Financial Concerns: No; Food, Clothing or Shelter Needs: No; Support System Lacking: No; Transportation Concerns: No Electronic Signature(s) Signed: 08/20/2020 4:14:31 PM By: Baruch Gouty RN, BSN Signed: 08/20/2020 5:03:25 PM  By: [name] MD Entered By: Baruch Gouty on 08/20/2020 11:03:35 -------------------------------------------------------------------------------- SuperBill Details Patient Name: Date of Service: [facility], [place]. 12/27/6182 Medical Record Number: [id number] Patient Account Number: [id number] Date of Birth/Sex: Treating RN: May 01, 1966 (54 y.o. [name] Primary Care Provider: [name] Other Clinician: Referring Provider: Treating Provider/Extender: [name], [name] in Treatment: 0 Diagnosis Coding ICD-10 Codes Code Description [phone] Type 2 diabetes mellitus with foot ulcer L97.528 Non-pressure chronic ulcer of other part of left foot with other specified severity Facility Procedures CPT4 Code: [id number] Description: [facility] VISIT-LEV 3 EST PT Modifier: 25 Quantity: 1 CPT4 Code: [id number] Description: 64314 - DEB SUBQ TISSUE 20 SQ CM/< ICD-10 Diagnosis Description E11.621 Type 2 diabetes mellitus with foot ulcer L97.528 Non-pressure chronic ulcer of other part of left foot with other specified severi Modifier: ty Quantity: 1 Physician Procedures : CPT4 Code Description Modifier [id number] 11042 - WC PHYS SUBQ TISS 20 SQ CM ICD-10 Diagnosis Description E11.621 Type 2 diabetes mellitus with foot ulcer L97.528 Non-pressure chronic ulcer of other part of left foot with other specified severity Quantity: 1 Electronic Signature(s) Signed: 08/20/2020 5:03:25 PM By: [name] MD Signed: 08/20/2020 5:12:11 PM By: [name] RN, BSN Entered By: [name] on 08/20/2020 12:57:43

## 2020-08-21 NOTE — Progress Notes (Signed)
[name], [name] ([id number]) Visit Report for 08/20/2020 Allergy List Details Patient Name: Date of Service: [facility], [place] 10/27/7822 10:30 A M Medical Record Number: [id number] Patient Account Number: [id number] Date of Birth/Sex: Treating RN: Mar 26, 1966 (55 y.o. [name] Primary Care Mariaeduarda Defranco: [name] Other Clinician: Referring Braidon Chermak: Treating Leamon Palau/Extender: [name], [name] in Treatment: 0 Allergies Active Allergies lithium Reaction: intolerance- uncomfortable feeling Severity: Moderate Adderall Reaction: palpitations Ritalin Reaction: palpitations Allergy Notes Electronic Signature(s) Signed: 08/20/2020 4:14:31 PM By: Baruch Gouty RN, BSN Entered By: Baruch Gouty on 08/20/2020 10:53:13 -------------------------------------------------------------------------------- Arrival Information Details Patient Name: Date of Service: [facility], [place] 1/54/0086 10:30 A M Medical Record Number: [id number] Patient Account Number: [id number] Date of Birth/Sex: Treating RN: Oct 07, 1965 (55 y.o. [name] Primary Care Geriann Lafont: [name] Other Clinician: Referring Webster Patrone: Treating Spike Desilets/Extender: Elwyn Lade in Treatment: 0 Visit Information Patient Arrived: Ambulatory Arrival Time: 10:46 Accompanied By: self Transfer Assistance: None Patient Identification Verified: Yes Secondary Verification Process Completed: Yes History Since Last Visit Has Dressing in Place as Prescribed: Yes Electronic Signature(s) Signed: 08/20/2020 4:14:31 PM By: Baruch Gouty RN, BSN Entered By: Baruch Gouty on 08/20/2020 10:49:37 -------------------------------------------------------------------------------- Clinic Level of Care Assessment Details Patient Name: Date of Service: [facility], [place] 6/71/2458 10:30 A M Medical Record Number: [id number] Patient Account Number: [id number] Date  of Birth/Sex: Treating RN: 1965/11/09 (55 y.o. [name] Primary Care Lekia Nier: [name] Other Clinician: Referring Benicia Bergevin: Treating Yojan Paskett/Extender: [name], [name] in Treatment: 0 Clinic Level of Care Assessment Items TOOL 4 Quantity Score X- 1 0 Use when only an EandM is performed on FOLLOW-UP visit ASSESSMENTS - Nursing Assessment / Reassessment X- 1 10 Reassessment of Co-morbidities (includes updates in patient status) X- 1 5 Reassessment of Adherence to Treatment Plan ASSESSMENTS - Wound and Skin A ssessment / Reassessment X - Simple Wound Assessment / Reassessment - one wound 1 5 _0  - 0 Complex Wound Assessment / Reassessment - multiple wounds _1  - 0 Dermatologic / Skin Assessment (not related to wound area) ASSESSMENTS - Focused Assessment _2  - 0 Circumferential Edema Measurements - multi extremities _3  - 0 Nutritional Assessment / Counseling / Intervention X- 1 5 Lower Extremity Assessment (monofilament, tuning fork, pulses) _4  - 0 Peripheral Arterial Disease Assessment (using hand held doppler) ASSESSMENTS - Ostomy and/or Continence Assessment and Care _5  - 0 Incontinence Assessment and Management _6  - 0 Ostomy Care Assessment and Management (repouching, etc.) PROCESS - Coordination of Care X - Simple Patient / Family Education for ongoing care 1 15 _7  - 0 Complex (extensive) Patient / Family Education for ongoing care X- 1 10 Staff obtains Programmer, systems, Records, T Results / Process Orders est _8  - 0 Staff telephones HHA, Nursing Homes / Clarify orders / etc _9  - 0 Routine Transfer to another Facility (non-emergent condition) _10  - 0 Routine Hospital Admission (non-emergent condition) _11  - 0 New Admissions / Biomedical engineer / Ordering NPWT Apligraf, etc. , _12  - 0 Emergency Hospital Admission (emergent condition) X- 1 10 Simple Discharge Coordination _13  - 0 Complex (extensive) Discharge Coordination PROCESS  - Special Needs _14  - 0 Pediatric / Minor Patient Management _15  - 0 Isolation Patient Management _16  - 0 Hearing / Language / Visual special needs _17  - 0 Assessment of Community assistance (transportation, D/C planning, etc.) _18  - 0 Additional assistance / Altered mentation _19  - 0 Support Surface(s) Assessment (bed, cushion, seat, etc.) INTERVENTIONS - Wound Cleansing / Measurement  X - Simple Wound Cleansing - one wound 1 5 _0  - 0 Complex Wound Cleansing - multiple wounds X- 1 5 Wound Imaging (photographs - any number of wounds) _1  - 0 Wound Tracing (instead of photographs) X- 1 5 Simple Wound Measurement - one wound _2  - 0 Complex Wound Measurement - multiple wounds INTERVENTIONS - Wound Dressings X - Small Wound Dressing one or multiple wounds 1 10 _3  - 0 Medium Wound Dressing one or multiple wounds _4  - 0 Large Wound Dressing one or multiple wounds X- 1 5 Application of Medications - topical <GLOVFIEPPIRJJOAC>_1<\/YSAYTKZSWFUXNATF>_5  - 0 Application of Medications - injection INTERVENTIONS - Miscellaneous _6  - 0 External ear exam _7  - 0 Specimen Collection (cultures, biopsies, blood, body fluids, etc.) _8  - 0 Specimen(s) / Culture(s) sent or taken to Lab for analysis _9  - 0 Patient Transfer (multiple staff / Civil Service fast streamer / Similar devices) _10  - 0 Simple Staple / Suture removal (25 or less) _11  - 0 Complex Staple / Suture removal (26 or more) _12  - 0 Hypo / Hyperglycemic Management (close monitor of Blood Glucose) _13  - 0 Ankle / Brachial Index (ABI) - do not check if billed separately X- 1 5 Vital Signs Has the patient been seen at the hospital within the last three years: Yes Total Score: 95 Level Of Care: New/Established - Level 3 Electronic Signature(s) Signed: 08/20/2020 5:12:11 PM By: [name] RN, BSN Entered By: [name] on 08/20/2020 12:57:36 -------------------------------------------------------------------------------- Encounter Discharge Information Details Patient Name: Date  of Service: [facility], [place] 7/32/2025 10:30 A M Medical Record Number: [id number] Patient Account Number: [id number] Date of Birth/Sex: Treating RN: 1965/10/09 (55 y.o. [name] Primary Care Jago Carton: [name] Other Clinician: Referring Yailyn Strack: Treating Hurbert Duran/Extender: [name], [name] in Treatment: 0 Encounter Discharge Information Items Post Procedure Vitals Discharge Condition: Stable Temperature (F): 98.4 Ambulatory Status: Ambulatory Pulse (bpm): 84 Discharge Destination: Home Respiratory Rate (breaths/min): 18 Transportation: Private Auto Blood Pressure (mmHg): 152/79 Accompanied By: self Schedule Follow-up Appointment: Yes Clinical Summary of Care: Electronic Signature(s) Signed: 08/20/2020 4:58:53 PM By: Deon Pilling Entered By: Deon Pilling on 08/20/2020 14:31:52 -------------------------------------------------------------------------------- Lower Extremity Assessment Details Patient Name: Date of Service: [facility], [place] 2/83/1517 10:30 A M Medical Record Number: [id number] Patient Account Number: [id number] Date of Birth/Sex: Treating RN: [birth year] (55 y.o. [name] Primary Care Chauncey Bruno: [name] Other Clinician: Referring Asja Frommer: Treating Eliane Hammersmith/Extender: [name], [name] in Treatment: 0 Edema Assessment Assessed: [name]: No] [Right: No] Edema: [Left: N] [Right: o] Calf Left: Right: Point of Measurement: From Medial Instep 37.4 cm Ankle Left: Right: Point of Measurement: From Medial Instep 22.8 cm Vascular Assessment Pulses: Dorsalis Pedis Palpable: [Left:Yes] Blood Pressure: Brachial: [Left:152] Dorsalis Pedis: 190 Ankle: [Left:Posterior Tibial: 178 1.25] Electronic Signature(s) Signed: 08/20/2020 4:14:31 PM By: Baruch Gouty RN, BSN Entered By: Baruch Gouty on 08/20/2020  11:11:29 -------------------------------------------------------------------------------- Multi Wound Chart Details Patient Name: Date of Service: [facility], [place] 11/26/9483 10:30 A M Medical Record Number: [id number] Patient Account Number: [id number] Date of Birth/Sex: Treating RN: May 28, 1966 (55 y.o. [name] Primary Care Bernd Crom: [name] Other Clinician: Referring Asami Lambright: Treating Malikah Principato/Extender: [name], [name] in Treatment: 0 Vital Signs Height(in): 76 Capillary Blood Glucose(mg/dl): 175 Weight(lbs): 260 Pulse(bpm): 84 Body Mass Index(BMI): 32 Blood Pressure(mmHg): 152/79 Temperature(F): 98.4 Respiratory Rate(breaths/min): 18 Photos: [6:No Photos Left Metatarsal head fourth] [N/A:N/A N/A] Wound Location: [6:Gradually Appeared] [N/A:N/A] Wounding Event: [6:Diabetic Wound/Ulcer of the Lower] [N/A:N/A] Primary  Etiology: [6:Extremity Cataracts, Hypertension, Type II] [N/A:N/A] Comorbid History: [6:Diabetes, Osteomyelitis, Neuropathy 07/03/2020] [N/A:N/A] Date Acquired: [6:0] [N/A:N/A] Weeks of Treatment: [6:Open] [N/A:N/A] Wound Status: [6:1.5x1.2x0.4] [N/A:N/A] Measurements L x W x D (cm) [6:1.414] [N/A:N/A] A (cm) : rea [6:0.565] [N/A:N/A] Volume (cm) : [6:Grade 1] [N/A:N/A] Classification: [6:Medium] [N/A:N/A] Exudate A mount: [6:Serosanguineous] [N/A:N/A] Exudate Type: [6:red, brown] [N/A:N/A] Exudate Color: [6:Distinct, outline attached] [N/A:N/A] Wound Margin: [6:Large (67-100%)] [N/A:N/A] Granulation A mount: [6:Pink, Pale] [N/A:N/A] Granulation Quality: [6:Small (1-33%)] [N/A:N/A] Necrotic A mount: [6:Fat Layer (Subcutaneous Tissue): Yes N/A] Exposed Structures: [6:Fascia: No Tendon: No Muscle: No Joint: No Bone: No None] [N/A:N/A] Epithelialization: [6:Debridement - Excisional] [N/A:N/A] Debridement: Pre-procedure Verification/Time Out 11:39 [N/A:N/A] Taken: [6:Subcutaneous] [N/A:N/A] Tissue Debrided:  [6:Skin/Subcutaneous Tissue] [N/A:N/A] Level: [6:1.8] [N/A:N/A] Debridement A (sq cm): [6:rea Curette] [N/A:N/A] Instrument: [6:Minimum] [N/A:N/A] Bleeding: [6:Pressure] [N/A:N/A] Hemostasis A chieved: [6:0] [N/A:N/A] Procedural Pain: [6:0] [N/A:N/A] Post Procedural Pain: [6:Procedure was tolerated well] [N/A:N/A] Debridement Treatment Response: [6:1.5x1.2x0.4] [N/A:N/A] Post Debridement Measurements L x W x D (cm) [6:0.565] [N/A:N/A] Post Debridement Volume: (cm) [6:Debridement] [N/A:N/A] Treatment Notes Electronic Signature(s) Signed: 08/20/2020 5:03:25 PM By: [name] MD Signed: 08/20/2020 5:12:11 PM By: [name] RN, BSN Entered By: [name] on 08/20/2020 12:07:02 -------------------------------------------------------------------------------- Multi-Disciplinary Care Plan Details Patient Name: Date of Service: [facility], [place] 7/34/2876 10:30 A M Medical Record Number: [id number] Patient Account Number: [id number] Date of Birth/Sex: Treating RN: [birth year] (55 y.o. [name] Primary Care [name]: [name] Other Clinician: Referring Amora Sheehy: Treating Kaizer Dissinger/Extender: Elwyn Lade in Treatment: 0 Multidisciplinary Care Plan reviewed with physician Active Inactive Nutrition Nursing Diagnoses: Impaired glucose control: actual or potential Potential for alteratiion in Nutrition/Potential for imbalanced nutrition Goals: Patient/caregiver agrees to and verbalizes understanding of need to use nutritional supplements and/or vitamins as prescribed Date Initiated: 08/20/2020 Target Resolution Date: 09/21/2020 Goal Status: Active Patient/caregiver will maintain therapeutic glucose control Date Initiated: 08/20/2020 Target Resolution Date: 09/21/2020 Goal Status: Active Interventions: Assess HgA1c results as ordered upon admission and as needed Assess patient nutrition upon admission and as needed per policy Provide  education on elevated blood sugars and impact on wound healing Provide education on nutrition Notes: Wound/Skin Impairment Nursing Diagnoses: Impaired tissue integrity Knowledge deficit related to ulceration/compromised skin integrity Goals: Patient/caregiver will verbalize understanding of skin care regimen Date Initiated: 08/20/2020 Target Resolution Date: 09/21/2020 Goal Status: Active Ulcer/skin breakdown will have a volume reduction of 30% by week 4 Date Initiated: 08/20/2020 Target Resolution Date: 09/21/2020 Goal Status: Active Interventions: Assess patient/caregiver ability to obtain necessary supplies Assess patient/caregiver ability to perform ulcer/skin care regimen upon admission and as needed Assess ulceration(s) every visit Provide education on ulcer and skin care Notes: Electronic Signature(s) Signed: 08/20/2020 5:12:11 PM By: [name] RN, BSN Entered By: [name] on 08/20/2020 11:38:07 -------------------------------------------------------------------------------- Pain Assessment Details Patient Name: Date of Service: [facility], [place]. 3/55/9741 10:30 A M Medical Record Number: [id number] Patient Account Number: [id number] Date of Birth/Sex: Treating RN: [birth year] (55 y.o. [name] Primary Care [name]: [name] Other Clinician: Referring Shaunak Kreis: Treating Gregary Blackard/Extender: [name], [name] in Treatment: 0 Active Problems Location of Pain Severity and Description of Pain Patient Has Paino No Site Locations Rate the pain. Rate the pain. Current Pain Level: 0 Pain Management and Medication Current Pain Management: Electronic Signature(s) Signed: 08/20/2020 4:14:31 PM By: Baruch Gouty RN, BSN Entered By: Baruch Gouty on 08/20/2020 11:15:36 -------------------------------------------------------------------------------- Patient/Caregiver Education Details Patient Name: Date of Service: [facility], [place]  3/21/2022andnbsp10:30 A M Medical Record Number: [id number] Patient Account Number: [id number] Date of Birth/Gender: Treating RN: Oct 26, 1965 (55 y.o. [name] Primary Care Physician: [name] Other Clinician: Referring Physician: Treating Physician/Extender: Elwyn Lade in Treatment: 0 Education Assessment Education Provided To: Patient Education Topics Provided Elevated Blood Sugar/ Impact on Healing: Methods: Explain/Verbal Responses: State content correctly Nutrition: Methods: Explain/Verbal Responses: State content correctly Wound/Skin Impairment: Methods: Explain/Verbal Responses: State content correctly Electronic Signature(s) Signed: 08/20/2020 5:12:11 PM By: [name] RN, BSN Entered By: [name] on 08/20/2020 11:38:20 -------------------------------------------------------------------------------- Wound Assessment Details Patient Name: Date of Service: [facility], [place] 09/16/3843 10:30 A M Medical Record Number: [id number] Patient Account Number: [id number] Date of Birth/Sex: Treating RN: 1966/03/12 (55 y.o. [name] Primary Care [name]: [name] Other Clinician: Referring [place]: Treating Yuleimy Kretz/Extender: [name], [name] in Treatment: 0 Wound Status Wound Number: 6 Primary Diabetic Wound/Ulcer of the Lower Extremity Etiology: Wound Location: Left Metatarsal head fourth Wound Status: Open Wounding Event: Gradually Appeared Comorbid Cataracts, Hypertension, Type II Diabetes, Osteomyelitis, Date Acquired: 07/03/2020 History: Neuropathy Weeks Of Treatment: 0 Clustered Wound: No Photos Wound Measurements Length: (cm) 1.5 Width: (cm) 1.2 Depth: (cm) 0.4 Area: (cm) 1.414 Volume: (cm) 0.565 % Reduction in Area: 0% % Reduction in Volume: 0% Epithelialization: None Tunneling: No Undermining: No Wound Description Classification: Grade  1 Wound Margin: Distinct, outline attached Exudate Amount: Medium Exudate Type: Serosanguineous Exudate Color: red, brown Foul Odor After Cleansing: No Slough/Fibrino Yes Wound Bed Granulation Amount: Large (67-100%) Exposed Structure Granulation Quality: Pink, Pale Fascia Exposed: No Necrotic Amount: Small (1-33%) Fat Layer (Subcutaneous Tissue) Exposed: Yes Necrotic Quality: Adherent Slough Tendon Exposed: No Muscle Exposed: No Joint Exposed: No Bone Exposed: No Treatment Notes Wound #6 (Metatarsal head fourth) Wound Laterality: Left Cleanser Byram Ancillary Kit - 15 Day Supply Discharge Instruction: Use supplies as instructed; Kit contains: (15) Saline Bullets; (15) 3x3 Gauze; 15 pr Gloves Peri-Wound Care Topical Primary Dressing Promogran Prisma Matrix, 4.34 (sq in) (silver collagen) Discharge Instruction: Moisten collagen with saline or hydrogel Secondary Dressing Woven Gauze Sponges 2x2 in Discharge Instruction: Apply over primary dressing as directed. Felt 2.5 yds x 5.5 in Discharge Instruction: Apply over primary dressing as directed. Secured With Conforming Stretch Gauze Bandage, Sterile 2x75 (in/in) Discharge Instruction: Secure with stretch gauze as directed. Paper Tape, 1x10 (in/yd) Discharge Instruction: Secure dressing with tape as directed. Compression Wrap Compression Stockings Add-Ons Electronic Signature(s) Signed: 08/20/2020 4:46:01 PM By: Sandre Kitty Signed: 08/21/2020 5:27:17 PM By: Baruch Gouty RN, BSN Previous Signature: 08/20/2020 4:14:31 PM Version By: Baruch Gouty RN, BSN Entered By: Sandre Kitty on 08/20/2020 16:30:21 -------------------------------------------------------------------------------- Vitals Details Patient Name: Date of Service: [facility], [place] 07/26/8248 10:30 A M Medical Record Number: [id number] Patient Account Number: [id number] Date of Birth/Sex: Treating RN: November 11, 1965 (55 y.o. [name] Primary Care Shanon Seawright: [name] Other Clinician: Referring Maurina Fawaz: Treating Myka Lukins/Extender: [name], [name] in Treatment: 0 Vital Signs Time Taken: 10:49 Temperature (F): 98.4 Height (in): 76 Pulse (bpm): 84 Source: Stated Respiratory Rate (breaths/min): 18 Weight (lbs): 260 Blood Pressure (mmHg): 152/79 Source: Stated Capillary Blood Glucose (mg/dl): 175 Body Mass Index (BMI): 31.6 Reference Range: 80 - 120 mg / dl Notes glucose per pt report last night Electronic Signature(s) Signed: 08/20/2020 4:14:31 PM By: Baruch Gouty RN, BSN Entered By: Baruch Gouty on 08/20/2020 10:51:32

## 2020-08-27 ENCOUNTER — Other Ambulatory Visit: Payer: Self-pay

## 2020-08-27 ENCOUNTER — Encounter (HOSPITAL_BASED_OUTPATIENT_CLINIC_OR_DEPARTMENT_OTHER): Payer: BLUE CROSS/BLUE SHIELD | Admitting: Internal Medicine

## 2020-08-27 DIAGNOSIS — E10621 Type 1 diabetes mellitus with foot ulcer: Secondary | ICD-10-CM | POA: Diagnosis not present

## 2020-08-27 NOTE — Progress Notes (Signed)
[name], HIPPERT ([id number]) Visit Report for 08/27/2020 Debridement Details Patient Name: Date of Service: [facility], [place] 2/37/6283 12:45 PM Medical Record Number: [id number] Patient Account Number: [id number] Date of Birth/Sex: Treating RN: 08-24-1965 (55 y.o. [name] Primary Care Provider: [name] Other Clinician: Referring Provider: Treating Provider/Extender: Elwyn Lade in Treatment: 1 Debridement Performed for Assessment: Wound #6 Left Metatarsal head fourth Performed By: Physician [name]., MD Debridement Type: Debridement Severity of Tissue Pre Debridement: Fat layer exposed Level of Consciousness (Pre-procedure): Awake and Alert Pre-procedure Verification/Time Out Yes - 13:23 Taken: Start Time: 13:23 T Area Debrided (L x W): otal 1.3 (cm) x 0.8 (cm) = 1.04 (cm) Tissue and other material debrided: Viable, Non-Viable, Callus, Subcutaneous Level: Skin/Subcutaneous Tissue Debridement Description: Excisional Instrument: Curette Bleeding: Minimum Hemostasis Achieved: Pressure End Time: 13:24 Procedural Pain: 0 Post Procedural Pain: 0 Response to Treatment: Procedure was tolerated well Level of Consciousness (Post- Awake and Alert procedure): Post Debridement Measurements of Total Wound Length: (cm) 1.3 Width: (cm) 0.8 Depth: (cm) 0.4 Volume: (cm) 0.327 Character of Wound/Ulcer Post Debridement: Improved Severity of Tissue Post Debridement: Fat layer exposed Post Procedure Diagnosis Same as Pre-procedure Electronic Signature(s) Signed: 08/27/2020 5:22:38 PM By: [name] MD Signed: 08/27/2020 5:28:50 PM By: [name] RN, BSN Entered By: [name] on 08/27/2020 13:37:45 -------------------------------------------------------------------------------- HPI Details Patient Name: Date of Service: [facility], [place] B. 3/71/0626 12:45 PM Medical Record Number: [id number] Patient Account  Number: [id number] Date of Birth/Sex: Treating RN: 12-11-1965 (55 y.o. [name] Primary Care Provider: [name] Other Clinician: Referring Provider: Treating Provider/Extender: [name], [name] in Treatment: 1 History of Present Illness HPI Description: ADMISSION 05/04/2018 This is a 55 year old man who apparently is a type I diabetic confirmed by appropriate serology testing 6 years ago. He is therefore on insulin. His most recent hemoglobin A1c was 10.7 on 03/27/2018. 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 As a result of a combination of this he is insensate and even has reduced sensation in his upper extremities. His current problem started in early July he noted a blister over the left fourth plantar metatarsal head. He saw [name] his podiatrist on 01/07/2018. At that time he had a wound that measured 2 x 1 x 0.4 cm. An x-ray did not show osteomyelitis. It did not appear that he actually followed up. He was admitted to hospital on 03/25/2018 through 03/30/2018 with his left foot painful and swollen. He underwent a surgical IandD by [name]. I believe his culture at that time showed Enterococcus faecalis and a bone culture showed enterococcus avium. He is been reviewed by [name] of infectious disease although I have not reviewed his note and he has been on 6 weeks worth of ertapenem which should finish sometime apparently on December 9.. MRI suggested possibility of septic arthritis but did not comment specifically on osteomyelitis. A more recent wound culture was negative. The patient has been referred here for consideration among other things of hyperbaric oxygen versus proceeding with a partial ray amputation. This would involve excision of the fourth metatarsal head. The patient is  offloading this and a Darco forefoot off loader. I think he is using iodoform packing. 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 Certainly would be a possibility of a total contact cast. I  did not see any arterial studies on him however his peripheral pulses are palpable 05/11/2018; patient started his hyperbarics on Friday but did not dive yesterday for personal issues. He saw [name] on 12//19 he notes that during the hospitalization in October 2019 he had an abscess and underwent a bone biopsy with culture that showed group B strep, enterococcus avium and Enterococcus faecalis. There was Prevotella from the abscess. He was put on ertapenem for 6 weeks which he completed. Per [name] notable for the fact that he is not been treated with ampicillin or amoxicillin. His hemoglobin A1c was noted to be high at 10.7. He is started on ampicillin sulbactam every 6 again I think this is for better coverage for the enterococcus. We started him on silver alginate as there is still purulent looking drainage coming out of the wound and there is indeed exposed bone. The patient has not been unwell 05/18/2018; patient continues with hyperbarics he is tolerating this well and has no complaints. He is on Unasyn every 6. We have been using silver alginate to the area over the fourth plantar metatarsal head. He is a poorly controlled diabetic. There is certainly less purulent drainage than when I first saw this man at the beginning of the month. He only sedimentation rate I see on him was 40 on 03/26/2018. States his blood sugars are under better control in the 120s to 130 range. Follows with his endocrinologist in [facility] I change his primary dressing to Prisma today attempting to increase the granulation. After I left the room he called [place] back into look at a area that he noticed on the right foot 2 days ago. Very superficial and in the medial aspect of his high plantar arch [secondary  to his neuropathy] he has a custom insert in the shoe on the right foot. 05/24/2018; continues with HBO doing well. He saw infectious disease today and is still on Unasyn every 6 hours. Not making a lot of progress on the left foot with Prisma I changed him to silver alginate strips. The new area on the right midfoot he is putting silver alginate on this as well. 06/01/2018; patient remains on Unasyn. Not making a lot of progress with granulation. I am going to put him in a total contact cast use Prisma for now. He has a high co-pay for an advanced treatment option but I suspect feeling in this wound may come to that 06/04/2018; patient back for the obligatory first total contact cast change. We continue to use Prisma 06/08/2018; the patient is tolerating the cast well although he had some loosening over the tibia which rubbed when he walked. There is no lesion here. We have been using Prisma under the total contact cast. He is in hyperbarics which she is tolerating well 06/15/2018; tolerating total contact cast and hyperbarics well. He continues IV antibiotics and for the underlying osteomyelitis as directed by infectious disease. His wound is making really nice progress 1/21; Patient's wound is down in depth. Using silver collagen. Ab's end this week and Pic line is to be d/ced 1/28; 0.8 cm in depth which is more than last time. We have been using silver collagen under total contact cast. He continues in hyperbarics. His IV antibiotics are finished PICC line has been removed 2/4; surprisingly today the area is actually epithelialized. There is no open area. There is however a divot where the epithelium is gone down to close the wound surface. I am not completely convinced  that this is going to remain viable over time but I think time is the best thing to determine this. There is no evidence of surrounding infection. As of today I do not think a repeat MRI or any other procedure is going to be indicative  of whether this is going to maintain a closed state. I am going to continue him in a total contact cast. He will complete HBO. Next week I will allow him perhaps to graduate into a Darco forefoot off loader, then a healing sandal. I will then send him back to podiatry to see if they can fashion an offloading shoe if this area remains closed 2/10; the patient was taken out of the cast today the area is healed. He has a divot however this is fully epithelialized and the epithelialized tissue looks healthy. I have put him into a Darco forefoot off loader. He will complete his last 2 hyperbaric treatments. I will see him next week and I may transition him into a surgical shoe versus his own shoe. He is following up with [name] of podiatry and I think a diabetic shoe with custom inserts could be provided with the signature provided by his endocrinologist ADMISSION 6/22 CLINICAL DATA: Foot ulcer, question of osteomyelitis EXAM: MRI OF THE LEFT FOOT WITHOUT CONTRAST TECHNIQUE: Multiplanar, multisequence MR imaging of the left was performed. No intravenous contrast was administered. COMPARISON: Radiograph same day FINDINGS: Bones/Joint/Cartilage Small cystic changes are seen at the fifth metatarsal head, likely degenerative changes. No marrow edema, osseous fracture, or periosteal reaction are seen throughout the osseous structures. Small joint effusion seen at the fifth MTP joint. There appears to be chronic cortical irregularity with erosive type change seen at the fourth metatarsal head which is unchanged since June 28, 2019, however new since 2019. Ligaments The Lisfranc ligaments and collateral ligaments appear to be intact. Muscles and Tendons Diffusely increased signal seen throughout the muscles surrounding the forefoot with fatty atrophy. The flexor and extensor tendons appear to be intact. The plantar fascia is intact. Soft tissues Along the dorsal lateral aspect of the  fifth metatarsal shaft there is a focal area superficial ulceration measuring approximately 8 mm in transverse dimension. There is overlying skin thickening and subcutaneous edema seen. No sinus tract or loculated fluid collection is noted. IMPRESSION: 1. Small focal area of ulceration along the dorsal lateral aspect of the fifth metatarsal shaft. No definite evidence of acute osteomyelitis, sinus tract, or abscess. 2. Progressive chronic erosive cortical destruction seen at the fourth metatarsal head since 2019, however unchanged since June 28, 2019, which could be from chronic osteomyelitis or prior injury. 3. Small joint effusion at the fifth MTP joint. Electronically Signed By: Prudencio Pair M.D. On: 08/02/2019 25:64 55 year old type I diabetic who we had in clinic in late 2019 to February 2020 with osteomyelitis of his left fourth metatarsal head and a wound in this area. This eventually healed with IV antibiotics and hyperbaric oxygen. He was relatively well until February of this year when he developed I believe 2 wounds on the left fifth met head 1 plantar and 1 more laterally. The one more laterally has since closed over but he has been left with a fairly persistent wound in the plantar left fifth met head. Has been following with [name] of podiatry. For an extended period of time he used Medihoney. He received doxycycline and April at which time a culture showed E. coli and MSSA. An MRI done on August 02, 2019 [place]  above] did not show osteomyelitis but did suggest some ongoing destruction at the fourth metatarsal head. He was switched to silver alginate as the primary dressing in May. Earlier this month he received a 2-week course of Bactrim due to erythema around the wound. He was referred to Dr. Alfred Levins of infectious disease who did not think anything was infected and recommended stopping antibiotics. The patient said he felt really well has you finished up the Bactrim but  then yesterday morning he became febrile again and noted more erythema around the wound. He was referred here for our review of this. His temperature today is 99.3. He denies any other infectious symptoms no cough no upper respiratory, no dysuria etc. He has been using silver alginate and offloading this area with a surgical shoe. 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 He apparently had shoulder surgery in the fall 2020 I did not look this up he apparently has hardware in this area. Recent x-ray done in Dr. Pasty Arch office on 6/7 was negative for osteo- ABIs have always been noncompressible. Is not previously been thought that arterial insufficiency is contributing to this. 6/29; patient returns the clinic. I thought he might be going to see Dr. Earleen Newport ongoing for this wound but he is back today. He did see Dr. Earleen Newport on 11/22/2019 I believe he agreed with the MRI and apparently was making some arrangements to have this done in [facility] which I am fine with if they can compare with the study of March 2. In any case he returns with a clinic of the wound is not as good. He has been using silver alginate. He is still on another antibiotic that he says Dr. Earleen Newport prescribed 7/8; the patient had his MRI which showed a progressive soft tissue ulceration plantar to the fifth MTP joint with increased soft tissue enhancement throughout the small toe no abscess was noted. There was progressive signal changes in the fifth metatarsal head and fifth proximal phalanx without cortical destruction or definite abnormal T1 signal. These findings are nonspecific could be secondary to hyperemia or early osteomyelitis. He is on amoxicillin as directed by Dr. Earleen Newport. There were plans to do a bone biopsy and culture done in the OR on Friday however the patient canceled this  because of insurance issues [surgical center not in [place]. In any case I have told him that this procedure in my opinion needs to be done. He is using Silvadene cream READMISSION 02/07/2020 This is a patient who had a progressive ulceration over the fifth MTP. We had him here in June and into the early July. He was also followed by [name] of podiatry. The day after we last saw him on 7/9 he went underwent a excision of the fifth MTP to aid in wound healing over this area. Pathology did not show osteomyelitis. Since then he has been following with Dr. Earleen Newport. Notable that earlier this month he presented with worse wound with purulence. CandS showed MSSA and he was treated with doxycycline and Silvadene cream. More recently he has been changed to Augmentin. He is using Betadine to the wound. He has a forefoot offloading shoe at some point in the postoperative. This wound opened in the surgical line. He is here for our review of this. With regards to his diabetes I spent some time on this today. This was diagnosed in his mid  40s he was on pills for a while and then on insulin. When he was here last time he told me that he thought he had had antipancreatic antibodies that showed he was type I if so I do not see this. He followed for a period of time with Dr. Meredith Pel endocrinology at [facility] who I gather is currently independent practice in [facility]. I have looked through her notes as it applies to what is available in care everywhere and she consistently calls him a type II diabetic. If he had other studies of his diabetes I do not see this. I have therefore changed my label of him to type II ABI in our clinic was 1.41 on the left 9/13; arrives in clinic today with things not looking as good. He has a small linear wound in the incision line but nonviable rolled edges and debris around the circumference. As well it looks as though he had a deep tissue injury over the base of the fifth  metatarsal. Raised may be blistered but no open area here. Also concerning erythema around the wound both distally and proximally 9/20; PCR culture I did last time showed high Enterococcus faecalis, medium MRSA and 3 different anaerobic's including medium Peptostreptococcus magnus. I gave him a combination of Augmentin and trimethoprim sulfamethoxazole starting on 9/16 for 10 days. He is tolerating these well He comes in today. His original wound area at the surgical site only has a small crevice we are using endoform here. Not surprisingly the area over the base of the fifth metatarsal has open with a superficial wound. He has a wrap/surgical shoe injury dorsally on the left foot he is asking about a total contact cast and I think what I know about this area suggests that the lateral part of his foot is a weightbearing surface and I think we could certainly justify this. 9/27; patient is finishing his antibiotics. His surgical wound is actually closed the area over the base of the left fifth metatarsal of is also closed but still with some eschar. 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 As such he actually walks on the outside of his foot. He has been in touch with [facility] Liliane Channel is their orthotic specialist READMISSION 08/20/2020 This is a 55 year old man who is a type II diabetic. 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 He has been in this clinic before with wounds over his left fifth and left fourth met head. He has a history of osteomyelitis in the left fifth met head requiring an excision of the fifth MTP.. The patient tells [place] that he has an AFO brace now to correct the ankle deformity. Sometime in February he developed a callus over the fourth metatarsal head ultimately Dr. Earleen Newport open this area to expose the wound. He used Silvadene cream for a while most  recently Promogran. He has had antibiotics as well including doxycycline and apparently currently Augmentin although I am not certain there is been any cultures. He states the area has not been x-rayed. 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 The met head here is subluxed. His x-ray that we ordered last week was negative for osteomyelitis.  Clinically there does not appear to be infection here. No erythema no drainage the wound does not probe to bone Electronic Signature(s) Signed: 08/27/2020 5:22:38 PM By: [name] MD Entered By: [name] on 08/27/2020 13:41:24 -------------------------------------------------------------------------------- Physical Exam Details Patient Name: Date of Service: [facility], [place]. 6/75/9163 12:45 PM Medical Record Number: [id number] Patient Account Number: [id number] Date of Birth/Sex: Treating RN: 02-20-1966 (55 y.o. [name] Primary Care Provider: [name] Other Clinician: Referring Provider: Treating Provider/Extender: [name], [name] in Treatment: 1 Notes Wound exam; left fourth metatarsal head. This has depth but absolutely no probing to bone. No purulence no erythema around the wound. There is a subluxed fourth metatarsal head Electronic Signature(s) Signed: 08/27/2020 5:22:38 PM By: [name] MD Entered By: [name] on 08/27/2020 13:41:55 -------------------------------------------------------------------------------- Physician Orders Details Patient Name: Date of Service: [facility], [place]. 12/01/7791 12:45 PM Medical Record Number: [id number] Patient Account Number: [id number] Date of Birth/Sex: Treating RN: August 25, 1965 (55 y.o. [name] Primary Care Provider: [name] Other Clinician: Referring Provider: Treating Provider/Extender: Elwyn Lade in Treatment: 1 Verbal / Phone Orders: No Diagnosis Coding ICD-10 Coding Code Description E11.621 Type 2 diabetes mellitus with foot ulcer L97.528 Non-pressure chronic ulcer of other part of left foot with other specified severity Follow-up Appointments Return Appointment in 1 week. Bathing/ Shower/ Hygiene May shower and wash wound with soap and water. - on days that dressing is changed Off-Loading Total Contact Cast to Left Lower Extremity Wound Treatment Wound #6 - Metatarsal head fourth Wound Laterality: Left Cleanser: Wound Cleanser 1 x Per Week Discharge Instructions: Cleanse the wound with wound cleanser prior to applying a clean dressing using gauze sponges, not tissue or cotton balls. Prim Dressing: Promogran Prisma Matrix, 4.34 (sq in) (silver collagen) (Generic) 1 x Per Week ary Discharge Instructions: Moisten collagen with saline or hydrogel Secondary Dressing: Woven Gauze Sponges 2x2 in (Generic) 1 x Per Week Discharge Instructions: Apply over primary dressing as directed. Secondary Dressing: Drawtex 4x4 in 1 x Per Week Discharge Instructions: Cut drawtex to fit inside wound Secured With: 40M Medipore H Soft Cloth Surgical T 4 x 2 (in/yd) 1 x Per Week ape Discharge Instructions: Secure dressing with tape as directed. Electronic Signature(s) Signed: 08/27/2020 5:22:38 PM By: [name] MD Signed: 08/27/2020 5:28:50 PM By: [name] RN, BSN Entered By: [name] on 08/27/2020 13:27:46 -------------------------------------------------------------------------------- Problem List Details Patient Name: Date of Service: [facility], [place]. 0/11/6224 12:45 PM Medical Record Number: [id number] Patient Account Number: [id number] Date of Birth/Sex: Treating RN: 1965-07-16 (55 y.o. [name] Primary Care Provider: [name] Other  Clinician: Referring Provider: Treating Provider/Extender: [name], [name] in Treatment: 1 Active Problems ICD-10 Encounter Code Description Active Date MDM Diagnosis E11.621 Type 2 diabetes mellitus with foot ulcer 08/20/2020 No Yes L97.528 Non-pressure chronic ulcer of other part of left foot with other specified 08/20/2020 No Yes severity Inactive Problems Resolved Problems Electronic Signature(s) Signed: 08/27/2020 5:22:38 PM By: [name] MD Entered By: [name] on 08/27/2020 13:37:24 -------------------------------------------------------------------------------- Progress Note Details Patient Name: Date of Service: [facility], [place]. 6/38/9373 12:45 PM Medical Record Number: [id number] Patient Account Number: [id number] Date of Birth/Sex: Treating RN: 07-19-1965 (55 y.o. [name] Primary Care Provider: [name] Other Clinician: Referring Provider: Treating Provider/Extender: [name], [name] in Treatment: 1 Subjective History of Present Illness (HPI) ADMISSION 05/04/2018 This is a 55 year old man who apparently  is a type I diabetic confirmed by appropriate serology testing 6 years ago. He is therefore on insulin. His most recent hemoglobin A1c was 10.7 on 03/27/2018. 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 As a result of a combination of this he is insensate and even has reduced sensation in his upper extremities. His current problem started in early July he noted a blister over the left fourth plantar metatarsal head. He saw [name] his podiatrist on 01/07/2018. At that time he had a wound that measured 2 x 1 x 0.4 cm. An x-ray did not show osteomyelitis. It did not appear that he actually followed up. He was admitted to hospital on 03/25/2018  through 03/30/2018 with his left foot painful and swollen. He underwent a surgical IandD by [name]. I believe his culture at that time showed Enterococcus faecalis and a bone culture showed enterococcus avium. He is been reviewed by [name] of infectious disease although I have not reviewed his note and he has been on 6 weeks worth of ertapenem which should finish sometime apparently on December 9.. MRI suggested possibility of septic arthritis but did not comment specifically on osteomyelitis. A more recent wound culture was negative. The patient has been referred here for consideration among other things of hyperbaric oxygen versus proceeding with a partial ray amputation. This would involve excision of the fourth metatarsal head. The patient is offloading this and a Darco forefoot off loader. I think he is using iodoform packing. 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 Certainly would be a possibility of a total contact cast. I did not see any arterial studies on him however his peripheral pulses are palpable 05/11/2018; patient started his hyperbarics on Friday but did not dive yesterday for personal issues. He saw [name] on 12//19 he notes that during the hospitalization in October 2019 he had an abscess and underwent a bone biopsy with culture that showed group B strep, enterococcus avium and Enterococcus faecalis. There was Prevotella from the abscess. He was put on ertapenem for 6 weeks which he completed. Per [name] notable for the fact that he is not been treated with ampicillin or amoxicillin. His hemoglobin A1c was noted to be high at 10.7. He is started on ampicillin sulbactam every 6 again I think this is for better coverage for the enterococcus. We started him on silver alginate as there is still purulent looking drainage coming out of the wound and there is indeed exposed bone. The patient has not been unwell 05/18/2018; patient continues with  hyperbarics he is tolerating this well and has no complaints. He is on Unasyn every 6. We have been using silver alginate to the area over the fourth plantar metatarsal head. He is a poorly controlled diabetic. There is certainly less purulent drainage than when I first saw this man at the beginning of the month. He only sedimentation rate I see on him was 40 on 03/26/2018. States his blood sugars are under better control in the 120s to 130 range. Follows with his endocrinologist in [facility] I change his primary dressing to Prisma today attempting to increase the granulation. After I left the room he called [place] back into look at a area that he noticed on the right foot 2 days ago. Very superficial and in the medial aspect of his high plantar arch [secondary  to his neuropathy] he has a custom insert in the shoe on the right foot. 05/24/2018; continues with HBO doing well. He saw infectious disease today and is still on Unasyn every 6 hours. Not making a lot of progress on the left foot with Prisma I changed him to silver alginate strips. The new area on the right midfoot he is putting silver alginate on this as well. 06/01/2018; patient remains on Unasyn. Not making a lot of progress with granulation. I am going to put him in a total contact cast use Prisma for now. He has a high co-pay for an advanced treatment option but I suspect feeling in this wound may come to that 06/04/2018; patient back for the obligatory first total contact cast change. We continue to use Prisma 06/08/2018; the patient is tolerating the cast well although he had some loosening over the tibia which rubbed when he walked. There is no lesion here. We have been using Prisma under the total contact cast. He is in hyperbarics which she is tolerating well 06/15/2018; tolerating total contact cast and hyperbarics well. He continues IV antibiotics and for the underlying osteomyelitis as directed by infectious disease. His wound is making  really nice progress 1/21; Patient's wound is down in depth. Using silver collagen. Ab's end this week and Pic line is to be d/ced 1/28; 0.8 cm in depth which is more than last time. We have been using silver collagen under total contact cast. He continues in hyperbarics. His IV antibiotics are finished PICC line has been removed 2/4; surprisingly today the area is actually epithelialized. There is no open area. There is however a divot where the epithelium is gone down to close the wound surface. I am not completely convinced that this is going to remain viable over time but I think time is the best thing to determine this. There is no evidence of surrounding infection. As of today I do not think a repeat MRI or any other procedure is going to be indicative of whether this is going to maintain a closed state. I am going to continue him in a total contact cast. He will complete HBO. Next week I will allow him perhaps to graduate into a Darco forefoot off loader, then a healing sandal. I will then send him back to podiatry to see if they can fashion an offloading shoe if this area remains closed 2/10; the patient was taken out of the cast today the area is healed. He has a divot however this is fully epithelialized and the epithelialized tissue looks healthy. I have put him into a Darco forefoot off loader. He will complete his last 2 hyperbaric treatments. I will see him next week and I may transition him into a surgical shoe versus his own shoe. He is following up with Dr. Ardelle Anton of podiatry and I think a diabetic shoe with custom inserts could be provided with the signature provided by his endocrinologist ADMISSION 6/22 CLINICAL DATA: Foot ulcer, question of osteomyelitis EXAM: MRI OF THE LEFT FOOT WITHOUT CONTRAST TECHNIQUE: Multiplanar, multisequence MR imaging of the left was performed. No intravenous contrast was administered. COMPARISON: Radiograph same  day FINDINGS: Bones/Joint/Cartilage Small cystic changes are seen at the fifth metatarsal head, likely degenerative changes. No marrow edema, osseous fracture, or periosteal reaction are seen throughout the osseous structures. Small joint effusion seen at the fifth MTP joint. There appears to be chronic cortical irregularity with erosive type change seen at the fourth metatarsal head which is unchanged  since June 28, 2019, however new since 2019. Ligaments The Lisfranc ligaments and collateral ligaments appear to be intact. Muscles and Tendons Diffusely increased signal seen throughout the muscles surrounding the forefoot with fatty atrophy. The flexor and extensor tendons appear to be intact. The plantar fascia is intact. Soft tissues Along the dorsal lateral aspect of the fifth metatarsal shaft there is a focal area superficial ulceration measuring approximately 8 mm in transverse dimension. There is overlying skin thickening and subcutaneous edema seen. No sinus tract or loculated fluid collection is noted. IMPRESSION: 1. Small focal area of ulceration along the dorsal lateral aspect of the fifth metatarsal shaft. No definite evidence of acute osteomyelitis, sinus tract, or abscess. 2. Progressive chronic erosive cortical destruction seen at the fourth metatarsal head since 2019, however unchanged since June 28, 2019, which could be from chronic osteomyelitis or prior injury. 3. Small joint effusion at the fifth MTP joint. Electronically Signed By: Prudencio Pair M.D. 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 This eventually healed with IV antibiotics and hyperbaric oxygen. He was relatively well until February of this year when he developed I believe 2 wounds on the left fifth met head 1 plantar and 1 more laterally. The one more laterally  has since closed over but he has been left with a fairly persistent wound in the plantar left fifth met head. Has been following with [name] of podiatry. For an extended period of time he used Medihoney. He received doxycycline and April at which time a culture showed E. coli and MSSA. An MRI done on August 02, 2019 [shown above] did not show osteomyelitis but did suggest some ongoing destruction at the fourth metatarsal head. He was switched to silver alginate as the primary dressing in May. Earlier this month he received a 2-week course of Bactrim due to erythema around the wound. He was referred to Dr. Alfred Levins of infectious disease who did not think anything was infected and recommended stopping antibiotics. The patient said he felt really well has you finished up the Bactrim but then yesterday morning he became febrile again and noted more erythema around the wound. He was referred here for our review of this. His temperature today is 99.3. He denies any other infectious symptoms no cough no upper respiratory, no dysuria etc. He has been using silver alginate and offloading this area with a surgical shoe. 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 He apparently had shoulder surgery in the fall 2020 I did not look this up he apparently has hardware in this area. Recent x-ray done in Dr. Pasty Arch office on 6/7 was negative for osteo- ABIs have always been noncompressible. Is not previously been thought that arterial insufficiency is contributing to this. 6/29; patient returns the clinic. I thought he might be going to see Dr. Earleen Newport ongoing for this wound but he is back today. He did see Dr. Earleen Newport on 11/22/2019 I believe he agreed with the MRI and apparently was making some arrangements to have this done in [facility] which I am fine with if they can  compare with the study of March 2. In any case he returns with a clinic of the wound is not as good. He has been using silver alginate.  He is still on another antibiotic that he says Dr. Earleen Newport prescribed 7/8; the patient had his MRI which showed a progressive soft tissue ulceration plantar to the fifth MTP joint with increased soft tissue enhancement throughout the small toe no abscess was noted. There was progressive signal changes in the fifth metatarsal head and fifth proximal phalanx without cortical destruction or definite abnormal T1 signal. These findings are nonspecific could be secondary to hyperemia or early osteomyelitis. He is on amoxicillin as directed by Dr. Earleen Newport. There were plans to do a bone biopsy and culture done in the OR on Friday however the patient canceled this because of insurance issues [surgical center not in [place]. In any case I have told him that this procedure in my opinion needs to be done. He is using Silvadene cream READMISSION 02/07/2020 This is a patient who had a progressive ulceration over the fifth MTP. We had him here in June and into the early July. He was also followed by [name] of podiatry. The day after we last saw him on 7/9 he went underwent a excision of the fifth MTP to aid in wound healing over this area. Pathology did not show osteomyelitis. Since then he has been following with Dr. Earleen Newport. Notable that earlier this month he presented with worse wound with purulence. CandS showed MSSA and he was treated with doxycycline and Silvadene cream. More recently he has been changed to Augmentin. He is using Betadine to the wound. He has a forefoot offloading shoe at some point in the postoperative. This wound opened in the surgical line. He is here for our review of this. With regards to his diabetes I spent some time on this today. This was diagnosed in his mid 63s he was on pills for a while and then on insulin. When he was here last time he told me  that he thought he had had antipancreatic antibodies that showed he was type I if so I do not see this. He followed for a period of time with Dr. Meredith Pel endocrinology at [facility] who I gather is currently independent practice in [facility]. I have looked through her notes as it applies to what is available in care everywhere and she consistently calls him a type II diabetic. If he had other studies of his diabetes I do not see this. I have therefore changed my label of him to type II ABI in our clinic was 1.41 on the left 9/13; arrives in clinic today with things not looking as good. He has a small linear wound in the incision line but nonviable rolled edges and debris around the circumference. As well it looks as though he had a deep tissue injury over the base of the fifth metatarsal. Raised may be blistered but no open area here. Also concerning erythema around the wound both distally and proximally 9/20; PCR culture I did last time showed high Enterococcus faecalis, medium MRSA and 3 different anaerobic's including medium Peptostreptococcus magnus. I gave him a combination of Augmentin and trimethoprim sulfamethoxazole starting on 9/16 for 10 days. He is tolerating these well He comes in today. His original wound area at the surgical site only has a small crevice we are using endoform here. Not surprisingly the area over the base of the fifth metatarsal has open with a superficial wound. He has a wrap/surgical shoe injury dorsally on the left foot he is asking about a total contact cast and I think what I know about this area  suggests that the lateral part of his foot is a weightbearing surface and I think we could certainly justify this. 9/27; patient is finishing his antibiotics. His surgical wound is actually closed the area over the base of the left fifth metatarsal of is also closed but still with some eschar. 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 As such he actually walks on the outside of his foot. He has been in touch with [facility] Liliane Channel is their orthotic specialist READMISSION 08/20/2020 This is a 55 year old man who is a type II diabetic. 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 He has been in this clinic before with wounds over his left fifth and left fourth met head. He has a history of osteomyelitis in the left fifth met head requiring an excision of the fifth MTP.. The patient tells [place] that he has an AFO brace now to correct the ankle deformity. Sometime in February he developed a callus over the fourth metatarsal head ultimately Dr. Earleen Newport open this area to expose the wound. He used Silvadene cream for a while most recently Promogran. He has had antibiotics as well including doxycycline and apparently currently Augmentin although I am not certain there is been any cultures. He states the area has not been x-rayed. 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 The met head here is subluxed. His x-ray that we ordered last week was negative for osteomyelitis. Clinically there does not appear to be infection here. No erythema no drainage the wound does not probe to bone Objective Constitutional Vitals Time Taken: 12:58 PM, Height: 76 in, Weight: 260 lbs, BMI: 31.6, Temperature: 98.2 F, Pulse: 87 bpm, Respiratory Rate: 18 breaths/min, Blood Pressure: 142/82 mmHg, Capillary Blood Glucose: 185 mg/dl. Integumentary (Hair, Skin) Wound #6 status is Open. Original cause of wound was Gradually Appeared. The date acquired was: 07/03/2020. The wound has been in treatment 1 weeks. The wound is located on the Left Metatarsal head fourth. The wound measures 1.3cm  length x 0.8cm width x 0.4cm depth; 0.817cm^2 area and 0.327cm^3 volume. There is Fat Layer (Subcutaneous Tissue) exposed. There is no tunneling or undermining noted. There is a medium amount of serosanguineous drainage noted. The wound margin is well defined and not attached to the wound base. There is large (67-100%) pink, pale granulation within the wound bed. There is a small (1- 33%) amount of necrotic tissue within the wound bed including Adherent Slough. General Notes: Callused Periwound Assessment Active Problems ICD-10 Type 2 diabetes mellitus with foot ulcer Non-pressure chronic ulcer of other part of left foot with other specified severity Procedures Wound #6 Pre-procedure diagnosis of Wound #6 is a Diabetic Wound/Ulcer of the Lower Extremity located on the Left Metatarsal head fourth .Severity of Tissue Pre Debridement is: Fat layer exposed. There was a Excisional Skin/Subcutaneous Tissue Debridement with a total area of 1.04 sq cm performed by [name]., MD. With the following instrument(s): Curette to remove Viable and Non-Viable tissue/material. Material removed includes Callus and Subcutaneous Tissue and. No specimens were taken. A time out was conducted at 13:23, prior to the start of the procedure. A Minimum amount of bleeding was controlled with Pressure. The procedure  was tolerated well with a pain level of 0 throughout and a pain level of 0 following the procedure. Post Debridement Measurements: 1.3cm length x 0.8cm width x 0.4cm depth; 0.327cm^3 volume. Character of Wound/Ulcer Post Debridement is improved. Severity of Tissue Post Debridement is: Fat layer exposed. Post procedure Diagnosis Wound #6: Same as Pre-Procedure Pre-procedure diagnosis of Wound #6 is a Diabetic Wound/Ulcer of the Lower Extremity located on the Left Metatarsal head fourth . There was a T Contact otal Cast Procedure by [name]., MD. Post procedure Diagnosis Wound #6: Same as  Pre-Procedure Plan Follow-up Appointments: Return Appointment in 1 week. Bathing/ Shower/ Hygiene: May shower and wash wound with soap and water. - on days that dressing is changed Off-Loading: T Contact Cast to Left Lower Extremity otal WOUND #6: - Metatarsal head fourth Wound Laterality: Left Cleanser: Wound Cleanser 1 x Per Week/ Discharge Instructions: Cleanse the wound with wound cleanser prior to applying a clean dressing using gauze sponges, not tissue or cotton balls. Prim Dressing: Promogran Prisma Matrix, 4.34 (sq in) (silver collagen) (Generic) 1 x Per Week/ ary Discharge Instructions: Moisten collagen with saline or hydrogel Secondary Dressing: Woven Gauze Sponges 2x2 in (Generic) 1 x Per Week/ Discharge Instructions: Apply over primary dressing as directed. Secondary Dressing: Drawtex 4x4 in 1 x Per Week/ Discharge Instructions: Cut drawtex to fit inside wound Secured With: 62M Medipore H Soft Cloth Surgical T 4 x 2 (in/yd) 1 x Per Week/ ape Discharge Instructions: Secure dressing with tape as directed. 1. We use a primary dressing is silver collagen 2. Debridement of thick callus around the wound and debris on the circumference and on the wound surface this is fibrinous #3 curette 3. T contact cast otal Electronic Signature(s) Signed: 08/27/2020 5:22:38 PM By: [name] MD Entered By: [name] on 08/27/2020 13:42:42 -------------------------------------------------------------------------------- Total Contact Cast Details Patient Name: Date of Service: [facility], [place]. 06/03/5850 12:45 PM Medical Record Number: [id number] Patient Account Number: [id number] Date of Birth/Sex: Treating RN: January 04, 1966 (56 y.o. [name] Primary Care Provider: [name] Other Clinician: Referring Provider: Treating Provider/Extender: Elwyn Lade in Treatment: 1 T Contact Cast Applied for Wound Assessment: otal Wound #6 Left  Metatarsal head fourth Performed By: Physician [name]., MD Post Procedure Diagnosis Same as Pre-procedure Electronic Signature(s) Signed: 08/27/2020 5:22:38 PM By: [name] MD Entered By: [name] on 08/27/2020 13:37:57 -------------------------------------------------------------------------------- SuperBill Details Patient Name: Date of Service: [facility], [place]. 11/13/4313 Medical Record Number: [id number] Patient Account Number: [id number] Date of Birth/Sex: Treating RN: 06/21/1965 (55 y.o. [name] Primary Care Provider: [name] Other Clinician: Referring Provider: Treating Provider/Extender: [name], [name] in Treatment: 1 Diagnosis Coding ICD-10 Codes Code Description [phone] Type 2 diabetes mellitus with foot ulcer L97.528 Non-pressure chronic ulcer of other part of left foot with other specified severity Facility Procedures CPT4 Code: [id number] Description: 98338 - DEB SUBQ TISSUE 20 SQ CM/< ICD-10 Diagnosis Description L97.528 Non-pressure chronic ulcer of other part of left foot with other specified seve E11.621 Type 2 diabetes mellitus with foot ulcer Modifier: rity Quantity: 1 Physician Procedures : CPT4 Code Description Modifier [id number] - WC PHYS SUBQ TISS 20 SQ CM ICD-10 Diagnosis Description L97.528 Non-pressure chronic ulcer of other part of left foot with other specified severity E11.621 Type 2 diabetes mellitus with foot ulcer Quantity: 1 Electronic Signature(s) Signed: 08/27/2020 5:22:38 PM By: [name] MD Entered By: [name] on 08/27/2020 13:42:57

## 2020-08-29 NOTE — Progress Notes (Signed)
Erik, [name] ([id number]) Visit Report for 08/27/2020 Arrival Information Details Patient Name: Date of Service: [facility], [place] 08/27/2020 12:45 PM Medical Record Number: [id number] Patient Account Number: [id number] Date of Birth/Sex: Treating RN: [birth year] (55 y.o. [name] Primary Care Marlynn Hinckley: Ananias Pilgrim Other Clinician: Referring Lovelee Forner: Treating Jerilynn Feldmeier/Extender: Alta Corning in Treatment: 1 Visit Information History Since Last Visit Added or deleted any medications: No Patient Arrived: Ambulatory Any new allergies or adverse reactions: No Arrival Time: 12:57 Had a fall or experienced change in No Accompanied By: self activities of daily living that may affect Transfer Assistance: Manual risk of falls: Patient Identification Verified: Yes Signs or symptoms of abuse/neglect since last visito No Secondary Verification Process Completed: Yes Hospitalized since last visit: No Implantable device outside of the clinic excluding No cellular tissue based products placed in the center since last visit: Has Dressing in Place as Prescribed: Yes Pain Present Now: No Electronic Signature(s) Signed: 08/29/2020 9:05:34 AM By: Karl Ito Entered By: Karl Ito on 08/27/2020 12:58:04 -------------------------------------------------------------------------------- Encounter Discharge Information Details Patient Name: Date of Service: [facility], [name] 08/27/2020 12:45 PM Medical Record Number: [id number] Patient Account Number: [id number] Date of Birth/Sex: Treating RN: February 07, 1966 (55 y.o. [name] Primary Care Denette Hass: Ananias Pilgrim Other Clinician: Referring Carnelius Hammitt: Treating Eliakim Tendler/Extender: Alta Corning in Treatment: 1 Encounter Discharge Information Items Post Procedure Vitals Discharge Condition: Stable Temperature (F): 98.2 Ambulatory Status: Ambulatory Pulse (bpm):  87 Discharge Destination: Home Respiratory Rate (breaths/min): 18 Transportation: Private Auto Blood Pressure (mmHg): 142/82 Accompanied By: self Schedule Follow-up Appointment: Yes Clinical Summary of Care: Electronic Signature(s) Signed: 08/27/2020 5:16:32 PM By: Shawn Stall Entered By: Shawn Stall on 08/27/2020 17:12:39 -------------------------------------------------------------------------------- Lower Extremity Assessment Details Patient Name: Date of Service: [facility], [place] 08/27/2020 12:45 PM Medical Record Number: [id number] Patient Account Number: [id number] Date of Birth/Sex: Treating RN: Jan 03, 1966 (55 y.o. [name] Primary Care Yusuke Beza: Ananias Pilgrim Other Clinician: Referring Cutler Sunday: Treating Karynn Deblasi/Extender: [name], [name] in Treatment: 1 Edema Assessment Assessed: Kyra Searles: Yes] Franne Forts: No] Edema: [Left: N] [Right: o] Calf Left: Right: Point of Measurement: 33 cm From Medial Instep 33 cm Ankle Left: Right: Point of Measurement: 8 cm From Medial Instep 21 cm Vascular Assessment Pulses: Dorsalis Pedis Palpable: [Left:Yes] Electronic Signature(s) Signed: 08/27/2020 5:20:59 PM By: Antonieta Iba Entered By: Antonieta Iba on 08/27/2020 13:06:00 -------------------------------------------------------------------------------- Multi Wound Chart Details Patient Name: Date of Service: [facility], [name] 08/27/2020 12:45 PM Medical Record Number: [id number] Patient Account Number: [id number] Date of Birth/Sex: Treating RN: [birth year] (55 y.o. [name] Primary Care Dana Debo: Ananias Pilgrim Other Clinician: Referring Kianni Lheureux: Treating Jozy Mcphearson/Extender: [name], [name] in Treatment: 1 Vital Signs Height(in): 76 Capillary Blood Glucose(mg/dl): 779 Weight(lbs): 390 Pulse(bpm): 87 Body Mass Index(BMI): 32 Blood Pressure(mmHg): 142/82 Temperature(F): 98.2 Respiratory  Rate(breaths/min): 18 Photos: [6:No Photos Left Metatarsal head fourth] [N/A:N/A N/A] Wound Location: [6:Gradually Appeared] [N/A:N/A] Wounding Event: [6:Diabetic Wound/Ulcer of the Lower] [N/A:N/A] Primary Etiology: [6:Extremity Cataracts, Hypertension, Type II] [N/A:N/A] Comorbid History: [6:Diabetes, Osteomyelitis, Neuropathy 07/03/2020] [N/A:N/A] Date Acquired: [6:1] [N/A:N/A] Weeks of Treatment: [6:Open] [N/A:N/A] Wound Status: [6:1.3x0.8x0.4] [N/A:N/A] Measurements L x W x D (cm) [6:0.817] [N/A:N/A] A (cm) : rea [6:0.327] [N/A:N/A] Volume (cm) : [6:42.20%] [N/A:N/A] % Reduction in A rea: [6:42.10%] [N/A:N/A] % Reduction in Volume: [6:Grade 1] [N/A:N/A] Classification: [6:Medium] [N/A:N/A] Exudate A mount: [6:Serosanguineous] [N/A:N/A] Exudate Type: [6:red, brown] [N/A:N/A] Exudate Color: [6:Well defined, not attached] [N/A:N/A] Wound  Margin: [6:Large (67-100%)] [N/A:N/A] Granulation A mount: [6:Pink, Pale] [N/A:N/A] Granulation Quality: [6:Small (1-33%)] [N/A:N/A] Necrotic A mount: [6:Fat Layer (Subcutaneous Tissue): Yes N/A] Exposed Structures: [6:Fascia: No Tendon: No Muscle: No Joint: No Bone: No Small (1-33%)] [N/A:N/A] Epithelialization: [6:Debridement - Excisional] [N/A:N/A] Debridement: Pre-procedure Verification/Time Out 13:23 [N/A:N/A] Taken: [6:Callus, Subcutaneous] [N/A:N/A] Tissue Debrided: [6:Skin/Subcutaneous Tissue] [N/A:N/A] Level: [6:1.04] [N/A:N/A] Debridement A (sq cm): [6:rea Curette] [N/A:N/A] Instrument: [6:Minimum] [N/A:N/A] Bleeding: [6:Pressure] [N/A:N/A] Hemostasis A chieved: [6:0] [N/A:N/A] Procedural Pain: [6:0] [N/A:N/A] Post Procedural Pain: [6:Procedure was tolerated well] [N/A:N/A] Debridement Treatment Response: [6:1.3x0.8x0.4] [N/A:N/A] Post Debridement Measurements L x W x D (cm) [6:0.327] [N/A:N/A] Post Debridement Volume: (cm) [6:Callused Periwound] [N/A:N/A] Assessment Notes: [6:Debridement] [N/A:N/A] Procedures Performed: [6:T  Contact Cast otal] Treatment Notes Electronic Signature(s) Signed: 08/27/2020 5:22:38 PM By: Baltazar Najjar MD Signed: 08/27/2020 5:28:50 PM By: Zandra Abts RN, BSN Entered By: Baltazar Najjar on 08/27/2020 13:37:32 -------------------------------------------------------------------------------- Multi-Disciplinary Care Plan Details Patient Name: Date of Service: [facility], [place] 08/27/2020 12:45 PM Medical Record Number: [id number] Patient Account Number: [id number] Date of Birth/Sex: Treating RN: [birth year] (55 y.o. [name] Primary Care Dudley Mages: Ananias Pilgrim Other Clinician: Referring Gianny Sabino: Treating Michel Hendon/Extender: Alta Corning in Treatment: 1 Multidisciplinary Care Plan reviewed with physician Active Inactive Nutrition Nursing Diagnoses: Impaired glucose control: actual or potential Potential for alteratiion in Nutrition/Potential for imbalanced nutrition Goals: Patient/caregiver agrees to and verbalizes understanding of need to use nutritional supplements and/or vitamins as prescribed Date Initiated: 08/20/2020 Target Resolution Date: 09/21/2020 Goal Status: Active Patient/caregiver will maintain therapeutic glucose control Date Initiated: 08/20/2020 Target Resolution Date: 09/21/2020 Goal Status: Active Interventions: Assess HgA1c results as ordered upon admission and as needed Assess patient nutrition upon admission and as needed per policy Provide education on elevated blood sugars and impact on wound healing Provide education on nutrition Treatment Activities: Education provided on Nutrition : 08/20/2020 Notes: Wound/Skin Impairment Nursing Diagnoses: Impaired tissue integrity Knowledge deficit related to ulceration/compromised skin integrity Goals: Patient/caregiver will verbalize understanding of skin care regimen Date Initiated: 08/20/2020 Target Resolution Date: 09/21/2020 Goal Status: Active Ulcer/skin  breakdown will have a volume reduction of 30% by week 4 Date Initiated: 08/20/2020 Target Resolution Date: 09/21/2020 Goal Status: Active Interventions: Assess patient/caregiver ability to obtain necessary supplies Assess patient/caregiver ability to perform ulcer/skin care regimen upon admission and as needed Assess ulceration(s) every visit Provide education on ulcer and skin care Notes: Electronic Signature(s) Signed: 08/27/2020 5:28:50 PM By: Zandra Abts RN, BSN Entered By: Zandra Abts on 08/27/2020 13:43:35 -------------------------------------------------------------------------------- Pain Assessment Details Patient Name: Date of Service: [facility], [name] [place]. 08/27/2020 12:45 PM Medical Record Number: [id number] Patient Account Number: [id number] Date of Birth/Sex: Treating RN: [birth year] (55 y.o. [name] Primary Care Tavaras Goody: Ananias Pilgrim Other Clinician: Referring Sonnie Bias: Treating Janaiya Beauchesne/Extender: [name], [name] in Treatment: 1 Active Problems Location of Pain Severity and Description of Pain Patient Has Paino No Site Locations Pain Management and Medication Current Pain Management: Electronic Signature(s) Signed: 08/27/2020 5:28:50 PM By: Zandra Abts RN, BSN Signed: 08/29/2020 9:05:34 AM By: Karl Ito Entered By: Karl Ito on 08/27/2020 12:58:38 -------------------------------------------------------------------------------- Patient/Caregiver Education Details Patient Name: Date of Service: [facility], [place] December 3/28/2022andnbsp12:45 PM Medical Record Number: [id number] Patient Account Number: [id number] Date of Birth/Gender: Treating RN: 03-08-1966 (54 y.o. [name] Primary Care Physician: Ananias Pilgrim Other Clinician: Referring Physician: Treating Physician/Extender: Alta Corning in Treatment: 1 Education Assessment Education Provided To: Patient Education  Topics Provided Nutrition: Methods: Explain/Verbal  Responses: State content correctly Wound/Skin Impairment: Methods: Explain/Verbal Responses: State content correctly Electronic Signature(s) Signed: 08/27/2020 5:28:50 PM By: Zandra Abts RN, BSN Entered By: Zandra Abts on 08/27/2020 13:43:48 -------------------------------------------------------------------------------- Wound Assessment Details Patient Name: Date of Service: [facility], [place]. 08/27/2020 12:45 PM Medical Record Number: [id number] Patient Account Number: [id number] Date of Birth/Sex: Treating RN: [birth year] (55 y.o. [name] Primary Care Bryony Kaman: Ananias Pilgrim Other Clinician: Referring Charlise Giovanetti: Treating Rufina Kimery/Extender: [name], [name] in Treatment: 1 Wound Status Wound Number: 6 Primary Diabetic Wound/Ulcer of the Lower Extremity Etiology: Wound Location: Left Metatarsal head fourth Wound Status: Open Wounding Event: Gradually Appeared Comorbid Cataracts, Hypertension, Type II Diabetes, Osteomyelitis, Date Acquired: 07/03/2020 History: Neuropathy Weeks Of Treatment: 1 Clustered Wound: No Photos Wound Measurements Length: (cm) 1.3 Width: (cm) 0.8 Depth: (cm) 0.4 Area: (cm) 0.817 Volume: (cm) 0.327 % Reduction in Area: 42.2% % Reduction in Volume: 42.1% Epithelialization: Small (1-33%) Tunneling: No Undermining: No Wound Description Classification: Grade 1 Wound Margin: Well defined, not attached Exudate Amount: Medium Exudate Type: Serosanguineous Exudate Color: red, brown Foul Odor After Cleansing: No Slough/Fibrino Yes Wound Bed Granulation Amount: Large (67-100%) Exposed Structure Granulation Quality: Pink, Pale Fascia Exposed: No Necrotic Amount: Small (1-33%) Fat Layer (Subcutaneous Tissue) Exposed: Yes Necrotic Quality: Adherent Slough Tendon Exposed: No Muscle Exposed: No Joint Exposed: No Bone Exposed: No Assessment Notes Callused  Periwound Treatment Notes Wound #6 (Metatarsal head fourth) Wound Laterality: Left Cleanser Wound Cleanser Discharge Instruction: Cleanse the wound with wound cleanser prior to applying a clean dressing using gauze sponges, not tissue or cotton balls. Peri-Wound Care Topical Primary Dressing Promogran Prisma Matrix, 4.34 (sq in) (silver collagen) Discharge Instruction: Moisten collagen with saline or hydrogel Secondary Dressing Woven Gauze Sponges 2x2 in Discharge Instruction: Apply over primary dressing as directed. Drawtex 4x4 in Discharge Instruction: Cut drawtex to fit inside wound Secured With 62M Medipore H Soft Cloth Surgical T 4 x 2 (in/yd) ape Discharge Instruction: Secure dressing with tape as directed. Compression Wrap Compression Stockings Add-Ons Electronic Signature(s) Signed: 08/28/2020 5:12:57 PM By: Antonieta Iba Signed: 08/29/2020 9:05:34 AM By: Karl Ito Previous Signature: 08/27/2020 5:20:59 PM Version By: Antonieta Iba Entered By: Karl Ito on 08/28/2020 16:16:08 -------------------------------------------------------------------------------- Vitals Details Patient Name: Date of Service: [facility], [name] 08/27/2020 12:45 PM Medical Record Number: [id number] Patient Account Number: [id number] Date of Birth/Sex: Treating RN: 1966-02-07 (55 y.o. [name] Primary Care Kenn Rekowski: Ananias Pilgrim Other Clinician: Referring Demarious Kapur: Treating Kyle Luppino/Extender: [name], [name] in Treatment: 1 Vital Signs Time Taken: 12:58 Temperature (F): 98.2 Height (in): 76 Pulse (bpm): 87 Weight (lbs): 260 Respiratory Rate (breaths/min): 18 Body Mass Index (BMI): 31.6 Blood Pressure (mmHg): 142/82 Capillary Blood Glucose (mg/dl): 630 Reference Range: 80 - 120 mg / dl Electronic Signature(s) Signed: 08/29/2020 9:05:34 AM By: Karl Ito Entered By: Karl Ito on 08/27/2020 12:58:33

## 2020-09-03 ENCOUNTER — Other Ambulatory Visit: Payer: Self-pay

## 2020-09-03 ENCOUNTER — Other Ambulatory Visit (HOSPITAL_COMMUNITY): Payer: Self-pay | Admitting: Internal Medicine

## 2020-09-03 ENCOUNTER — Encounter (HOSPITAL_BASED_OUTPATIENT_CLINIC_OR_DEPARTMENT_OTHER): Payer: BLUE CROSS/BLUE SHIELD | Attending: Internal Medicine | Admitting: Internal Medicine

## 2020-09-03 DIAGNOSIS — E10621 Type 1 diabetes mellitus with foot ulcer: Secondary | ICD-10-CM | POA: Insufficient documentation

## 2020-09-03 DIAGNOSIS — G6181 Chronic inflammatory demyelinating polyneuritis: Secondary | ICD-10-CM | POA: Diagnosis not present

## 2020-09-03 DIAGNOSIS — M869 Osteomyelitis, unspecified: Secondary | ICD-10-CM | POA: Insufficient documentation

## 2020-09-03 DIAGNOSIS — I10 Essential (primary) hypertension: Secondary | ICD-10-CM | POA: Insufficient documentation

## 2020-09-03 DIAGNOSIS — E11621 Type 2 diabetes mellitus with foot ulcer: Secondary | ICD-10-CM

## 2020-09-03 DIAGNOSIS — E1051 Type 1 diabetes mellitus with diabetic peripheral angiopathy without gangrene: Secondary | ICD-10-CM | POA: Diagnosis not present

## 2020-09-03 DIAGNOSIS — L97528 Non-pressure chronic ulcer of other part of left foot with other specified severity: Secondary | ICD-10-CM | POA: Diagnosis not present

## 2020-09-03 DIAGNOSIS — L02612 Cutaneous abscess of left foot: Secondary | ICD-10-CM | POA: Insufficient documentation

## 2020-09-03 DIAGNOSIS — E1169 Type 2 diabetes mellitus with other specified complication: Secondary | ICD-10-CM

## 2020-09-03 DIAGNOSIS — Z794 Long term (current) use of insulin: Secondary | ICD-10-CM | POA: Insufficient documentation

## 2020-09-03 DIAGNOSIS — Z87891 Personal history of nicotine dependence: Secondary | ICD-10-CM | POA: Insufficient documentation

## 2020-09-03 DIAGNOSIS — G600 Hereditary motor and sensory neuropathy: Secondary | ICD-10-CM | POA: Insufficient documentation

## 2020-09-03 DIAGNOSIS — Z452 Encounter for adjustment and management of vascular access device: Secondary | ICD-10-CM | POA: Insufficient documentation

## 2020-09-03 DIAGNOSIS — E1042 Type 1 diabetes mellitus with diabetic polyneuropathy: Secondary | ICD-10-CM | POA: Insufficient documentation

## 2020-09-03 DIAGNOSIS — Z833 Family history of diabetes mellitus: Secondary | ICD-10-CM | POA: Insufficient documentation

## 2020-09-03 NOTE — Progress Notes (Signed)
CHANAN, DETWILER ([id number]) Visit Report for 09/03/2020 Debridement Details Patient Name: Date of Service: [facility], [place] 12/08/2954 9:00 A M Medical Record Number: [id number] Patient Account Number: [id number] Date of Birth/Sex: Treating RN: 06/19/1965 (55 y.o. [name] Primary Care Provider: [name] Other Clinician: Referring Provider: Treating Provider/Extender: [name], [name] in Treatment: 2 Debridement Performed for Assessment: Wound #6 Left Metatarsal head fourth Performed By: Physician [name]., MD Debridement Type: Debridement Severity of Tissue Pre Debridement: Fat layer exposed Level of Consciousness (Pre-procedure): Awake and Alert Pre-procedure Verification/Time Out Yes - 09:36 Taken: Start Time: 09:36 T Area Debrided (L x W): otal 1.2 (cm) x 1.6 (cm) = 1.92 (cm) Tissue and other material debrided: Viable, Non-Viable, Slough, Subcutaneous, Slough Level: Skin/Subcutaneous Tissue Debridement Description: Excisional Instrument: Curette Bleeding: Minimum Hemostasis Achieved: Pressure End Time: 09:37 Procedural Pain: 0 Post Procedural Pain: 0 Response to Treatment: Procedure was tolerated well Level of Consciousness (Post- Awake and Alert procedure): Post Debridement Measurements of Total Wound Length: (cm) 1.2 Width: (cm) 1.6 Depth: (cm) 0.8 Volume: (cm) 1.206 Character of Wound/Ulcer Post Debridement: Stable Severity of Tissue Post Debridement: Fat layer exposed Post Procedure Diagnosis Same as Pre-procedure Electronic Signature(s) Signed: 09/03/2020 4:59:32 PM By: [name] MD Signed: 09/03/2020 5:00:21 PM By: [name] RN, BSN Entered By: [name] on 09/03/2020 09:51:28 -------------------------------------------------------------------------------- HPI Details Patient Name: Date of Service: [facility], [place] 09/06/9627 9:00 A M Medical Record Number: [id number] Patient Account  Number: [id number] Date of Birth/Sex: Treating RN: 28-Oct-1965 (55 y.o. [name] Primary Care Provider: [name] Other Clinician: Referring Provider: Treating Provider/Extender: [name], [name] in Treatment: 2 History of Present Illness HPI Description: ADMISSION 05/04/2018 This is a 55 year old man who apparently is a type I diabetic confirmed by appropriate serology testing 6 years ago. He is therefore on insulin. His most recent hemoglobin A1c was 10.7 on 03/27/2018. 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 As a result of a combination of this he is insensate and even has reduced sensation in his upper extremities. His current problem started in early July he noted a blister over the left fourth plantar metatarsal head. He saw [name] his podiatrist on 01/07/2018. At that time he had a wound that measured 2 x 1 x 0.4 cm. An x-ray did not show osteomyelitis. It did not appear that he actually followed up. He was admitted to hospital on 03/25/2018 through 03/30/2018 with his left foot painful and swollen. He underwent a surgical IandD by [name]. I believe his culture at that time showed Enterococcus faecalis and a bone culture showed enterococcus avium. He is been reviewed by [name] of infectious disease although I have not reviewed his note and he has been on 6 weeks worth of ertapenem which should finish sometime apparently on December 9.. MRI suggested possibility of septic arthritis but did not comment specifically on osteomyelitis. A more recent wound culture was negative. The patient has been referred here for consideration among other things of hyperbaric oxygen versus proceeding with a partial ray amputation. This would involve excision of the fourth metatarsal head. The patient is  offloading this and a Darco forefoot off loader. I think he is using iodoform packing. 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 Certainly would be a possibility of a total  contact cast. I did not see any arterial studies on him however his peripheral pulses are palpable 05/11/2018; patient started his hyperbarics on Friday but did not dive yesterday for personal issues. He saw [name] on 12//19 he notes that during the hospitalization in October 2019 he had an abscess and underwent a bone biopsy with culture that showed group B strep, enterococcus avium and Enterococcus faecalis. There was Prevotella from the abscess. He was put on ertapenem for 6 weeks which he completed. Per [name] notable for the fact that he is not been treated with ampicillin or amoxicillin. His hemoglobin A1c was noted to be high at 10.7. He is started on ampicillin sulbactam every 6 again I think this is for better coverage for the enterococcus. We started him on silver alginate as there is still purulent looking drainage coming out of the wound and there is indeed exposed bone. The patient has not been unwell 05/18/2018; patient continues with hyperbarics he is tolerating this well and has no complaints. He is on Unasyn every 6. We have been using silver alginate to the area over the fourth plantar metatarsal head. He is a poorly controlled diabetic. There is certainly less purulent drainage than when I first saw this man at the beginning of the month. He only sedimentation rate I see on him was 40 on 03/26/2018. States his blood sugars are under better control in the 120s to 130 range. Follows with his endocrinologist in [facility] I change his primary dressing to Prisma today attempting to increase the granulation. After I left the room he called [place] back into look at a area that he noticed on the right foot 2 days ago. Very superficial and in the medial aspect of his high plantar arch [secondary  to his neuropathy] he has a custom insert in the shoe on the right foot. 05/24/2018; continues with HBO doing well. He saw infectious disease today and is still on Unasyn every 6 hours. Not making a lot of progress on the left foot with Prisma I changed him to silver alginate strips. The new area on the right midfoot he is putting silver alginate on this as well. 06/01/2018; patient remains on Unasyn. Not making a lot of progress with granulation. I am going to put him in a total contact cast use Prisma for now. He has a high co-pay for an advanced treatment option but I suspect feeling in this wound may come to that 06/04/2018; patient back for the obligatory first total contact cast change. We continue to use Prisma 06/08/2018; the patient is tolerating the cast well although he had some loosening over the tibia which rubbed when he walked. There is no lesion here. We have been using Prisma under the total contact cast. He is in hyperbarics which she is tolerating well 06/15/2018; tolerating total contact cast and hyperbarics well. He continues IV antibiotics and for the underlying osteomyelitis as directed by infectious disease. His wound is making really nice progress 1/21; Patient's wound is down in depth. Using silver collagen. Ab's end this week and Pic line is to be d/ced 1/28; 0.8 cm in depth which is more than last time. We have been using silver collagen under total contact cast. He continues in hyperbarics. His IV antibiotics are finished PICC line has been removed 2/4; surprisingly today the area is actually epithelialized. There is no open area. There is however a divot where the epithelium is gone down to close the wound surface. I am  not completely convinced that this is going to remain viable over time but I think time is the best thing to determine this. There is no evidence of surrounding infection. As of today I do not think a repeat MRI or any other procedure is going to be indicative  of whether this is going to maintain a closed state. I am going to continue him in a total contact cast. He will complete HBO. Next week I will allow him perhaps to graduate into a Darco forefoot off loader, then a healing sandal. I will then send him back to podiatry to see if they can fashion an offloading shoe if this area remains closed 2/10; the patient was taken out of the cast today the area is healed. He has a divot however this is fully epithelialized and the epithelialized tissue looks healthy. I have put him into a Darco forefoot off loader. He will complete his last 2 hyperbaric treatments. I will see him next week and I may transition him into a surgical shoe versus his own shoe. He is following up with [name] of podiatry and I think a diabetic shoe with custom inserts could be provided with the signature provided by his endocrinologist ADMISSION 6/22 CLINICAL DATA: Foot ulcer, question of osteomyelitis EXAM: MRI OF THE LEFT FOOT WITHOUT CONTRAST TECHNIQUE: Multiplanar, multisequence MR imaging of the left was performed. No intravenous contrast was administered. COMPARISON: Radiograph same day FINDINGS: Bones/Joint/Cartilage Small cystic changes are seen at the fifth metatarsal head, likely degenerative changes. No marrow edema, osseous fracture, or periosteal reaction are seen throughout the osseous structures. Small joint effusion seen at the fifth MTP joint. There appears to be chronic cortical irregularity with erosive type change seen at the fourth metatarsal head which is unchanged since June 28, 2019, however new since 2019. Ligaments The Lisfranc ligaments and collateral ligaments appear to be intact. Muscles and Tendons Diffusely increased signal seen throughout the muscles surrounding the forefoot with fatty atrophy. The flexor and extensor tendons appear to be intact. The plantar fascia is intact. Soft tissues Along the dorsal lateral aspect of the  fifth metatarsal shaft there is a focal area superficial ulceration measuring approximately 8 mm in transverse dimension. There is overlying skin thickening and subcutaneous edema seen. No sinus tract or loculated fluid collection is noted. IMPRESSION: 1. Small focal area of ulceration along the dorsal lateral aspect of the fifth metatarsal shaft. No definite evidence of acute osteomyelitis, sinus tract, or abscess. 2. Progressive chronic erosive cortical destruction seen at the fourth metatarsal head since 2019, however unchanged since June 28, 2019, which could be from chronic osteomyelitis or prior injury. 3. Small joint effusion at the fifth MTP joint. Electronically Signed By: Prudencio Pair M.D. On: 08/02/2019 89:35 55 year old type I diabetic who we had in clinic in late 2019 to February 2020 with osteomyelitis of his left fourth metatarsal head and a wound in this area. This eventually healed with IV antibiotics and hyperbaric oxygen. He was relatively well until February of this year when he developed I believe 2 wounds on the left fifth met head 1 plantar and 1 more laterally. The one more laterally has since closed over but he has been left with a fairly persistent wound in the plantar left fifth met head. Has been following with [name] of podiatry. For an extended period of time he used Medihoney. He received doxycycline and April at which time a culture showed E. coli and MSSA. An MRI done on August 02, 2019 [shown above] did not show osteomyelitis but did suggest some ongoing destruction at the fourth metatarsal head. He was switched to silver alginate as the primary dressing in May. Earlier this month he received a 2-week course of Bactrim due to erythema around the wound. He was referred to Dr. Alfred Levins of infectious disease who did not think anything was infected and recommended stopping antibiotics. The patient said he felt really well has you finished up the Bactrim but  then yesterday morning he became febrile again and noted more erythema around the wound. He was referred here for our review of this. His temperature today is 99.3. He denies any other infectious symptoms no cough no upper respiratory, no dysuria etc. He has been using silver alginate and offloading this area with a surgical shoe. 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 He apparently had shoulder surgery in the fall 2020 I did not look this up he apparently has hardware in this area. Recent x-ray done in Dr. Pasty Arch office on 6/7 was negative for osteo- ABIs have always been noncompressible. Is not previously been thought that arterial insufficiency is contributing to this. 6/29; patient returns the clinic. I thought he might be going to see Dr. Earleen Newport ongoing for this wound but he is back today. He did see Dr. Earleen Newport on 11/22/2019 I believe he agreed with the MRI and apparently was making some arrangements to have this done in [facility] which I am fine with if they can compare with the study of March 2. In any case he returns with a clinic of the wound is not as good. He has been using silver alginate. He is still on another antibiotic that he says Dr. Earleen Newport prescribed 7/8; the patient had his MRI which showed a progressive soft tissue ulceration plantar to the fifth MTP joint with increased soft tissue enhancement throughout the small toe no abscess was noted. There was progressive signal changes in the fifth metatarsal head and fifth proximal phalanx without cortical destruction or definite abnormal T1 signal. These findings are nonspecific could be secondary to hyperemia or early osteomyelitis. He is on amoxicillin as directed by Dr. Earleen Newport. There were plans to do a bone biopsy and culture done in the OR on Friday however the patient canceled this  because of insurance issues [surgical center not in [place]. In any case I have told him that this procedure in my opinion needs to be done. He is using Silvadene cream READMISSION 02/07/2020 This is a patient who had a progressive ulceration over the fifth MTP. We had him here in June and into the early July. He was also followed by [name] of podiatry. The day after we last saw him on 7/9 he went underwent a excision of the fifth MTP to aid in wound healing over this area. Pathology did not show osteomyelitis. Since then he has been following with Dr. Earleen Newport. Notable that earlier this month he presented with worse wound with purulence. CandS showed MSSA and he was treated with doxycycline and Silvadene cream. More recently he has been changed to Augmentin. He is using Betadine to the wound. He has a forefoot offloading shoe at some point in the postoperative. This wound opened in the surgical line. He is here for our review of this. With regards to his diabetes I spent some time on this today. This was diagnosed  in his mid 40s he was on pills for a while and then on insulin. When he was here last time he told me that he thought he had had antipancreatic antibodies that showed he was type I if so I do not see this. He followed for a period of time with Dr. Meredith Pel endocrinology at [facility] who I gather is currently independent practice in [facility]. I have looked through her notes as it applies to what is available in care everywhere and she consistently calls him a type II diabetic. If he had other studies of his diabetes I do not see this. I have therefore changed my label of him to type II ABI in our clinic was 1.41 on the left 9/13; arrives in clinic today with things not looking as good. He has a small linear wound in the incision line but nonviable rolled edges and debris around the circumference. As well it looks as though he had a deep tissue injury over the base of the fifth  metatarsal. Raised may be blistered but no open area here. Also concerning erythema around the wound both distally and proximally 9/20; PCR culture I did last time showed high Enterococcus faecalis, medium MRSA and 3 different anaerobic's including medium Peptostreptococcus magnus. I gave him a combination of Augmentin and trimethoprim sulfamethoxazole starting on 9/16 for 10 days. He is tolerating these well He comes in today. His original wound area at the surgical site only has a small crevice we are using endoform here. Not surprisingly the area over the base of the fifth metatarsal has open with a superficial wound. He has a wrap/surgical shoe injury dorsally on the left foot he is asking about a total contact cast and I think what I know about this area suggests that the lateral part of his foot is a weightbearing surface and I think we could certainly justify this. 9/27; patient is finishing his antibiotics. His surgical wound is actually closed the area over the base of the left fifth metatarsal of is also closed but still with some eschar. 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 As such he actually walks on the outside of his foot. He has been in touch with [facility] Liliane Channel is their orthotic specialist READMISSION 08/20/2020 This is a 55 year old man who is a type II diabetic. 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 He has been in this clinic before with wounds over his left fifth and left fourth met head. He has a history of osteomyelitis in the left fifth met head requiring an excision of the fifth MTP.. The patient tells [place] that he has an AFO brace now to correct the ankle deformity. Sometime in February he developed a callus over the fourth metatarsal head ultimately Dr. Earleen Newport open this area to expose the wound. He used Silvadene cream for a while most  recently Promogran. He has had antibiotics as well including doxycycline and apparently currently Augmentin although I am not certain there is been any cultures. He states the area has not been x-rayed. 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 The met head here is subluxed. His x-ray that we ordered last week was  negative for osteomyelitis. Clinically there does not appear to be infection here. No erythema no drainage the wound does not probe to bone 4/4; put a total contact cast on him last week primary dressing of silver collagen he arrives in clinic today with a wound measuring larger and as opposed to last week a direct probe to bone. He is definitely going to need an MRI Electronic Signature(s) Signed: 09/03/2020 4:59:32 PM By: [name] MD Entered By: [name] on 09/03/2020 09:52:19 -------------------------------------------------------------------------------- Physical Exam Details Patient Name: Date of Service: [facility], [place] 06/11/5091 9:00 A M Medical Record Number: [id number] Patient Account Number: [id number] Date of Birth/Sex: Treating RN: Feb 29, 1966 (55 y.o. [name] Primary Care Provider: [name] Other Clinician: Referring Provider: Treating Provider/Extender: [name], [name] in Treatment: 2 Constitutional Sitting or standing Blood Pressure is within target range for patient.. Pulse regular and within target range for patient.Marland Kitchen Respirations regular, non-labored and within target range.. Temperature is normal and within the target range for the patient.Marland Kitchen Appears in no distress. Notes Wound exam; left fourth metatarsal head. Unfortunately the deep part of the central part of this wound now probes to bone. I used a #3 curette to  remove necrotic debris from this area and there was exposed bone. This is quite a bit worse than last week. There is no overt purulence Electronic Signature(s) Signed: 09/03/2020 4:59:32 PM By: [name] MD Entered By: [name] on 09/03/2020 09:55:03 -------------------------------------------------------------------------------- Physician Orders Details Patient Name: Date of Service: [facility], [place] 02/08/8337 9:00 [place] Record Number: [id number] Patient Account Number: [id number] Date of Birth/Sex: Treating RN: August 24, 1965 (55 y.o. [name] Primary Care Provider: [name] Other Clinician: Referring Provider: Treating Provider/Extender: Elwyn Lade in Treatment: 2 Verbal / Phone Orders: No Diagnosis Coding ICD-10 Coding Code Description E11.621 Type 2 diabetes mellitus with foot ulcer L97.528 Non-pressure chronic ulcer of other part of left foot with other specified severity Follow-up Appointments Return Appointment in 1 week. Bathing/ Shower/ Hygiene May shower and wash wound with soap and water. - on days that dressing is changed Off-Loading Total Contact Cast to Left Lower Extremity Wound Treatment Wound #6 - Metatarsal head fourth Wound Laterality: Left Cleanser: Wound Cleanser 1 x Per Week Discharge Instructions: Cleanse the wound with wound cleanser prior to applying a clean dressing using gauze sponges, not tissue or cotton balls. Prim Dressing: Promogran Prisma Matrix, 4.34 (sq in) (silver collagen) (Generic) 1 x Per Week ary Discharge Instructions: Moisten collagen with saline or hydrogel Secondary Dressing: Woven Gauze Sponges 2x2 in (Generic) 1 x Per Week Discharge Instructions: Apply over primary dressing as directed. Secondary Dressing: Drawtex 4x4 in 1 x Per Week Discharge Instructions: Cut drawtex to fit inside wound Secured With: 62M Medipore H Soft Cloth Surgical T 4 x 2 (in/yd) 1 x Per  Week ape Discharge Instructions: Secure dressing with tape as directed. Radiology MRI, left foot with and without contrast - Non healing wound on left 4th metatarsal, rule out Osteomyeltitis - (ICD10 E11.621 - Type 2 diabetes mellitus with foot ulcer) Electronic Signature(s) Signed: 09/03/2020 4:59:32 PM By: [name] MD Signed: 09/03/2020 5:00:21 PM By: [name] RN, BSN Entered By: [name] on 09/03/2020 09:39:25 Prescription 09/03/2020 -------------------------------------------------------------------------------- Theone Stanley B. [name] MD Patient Name: Provider: [birth year] [id number] Date of Birth: NPI#: Jerilynn Mages [id number] Sex: DEA #: [phone] [id number] Phone #: License #: [place] Patient Address: [address] [place], [place] [address] [place], [place] [address] [phone] Allergies lithium; Adderall; Ritalin Provider's Orders MRI, left foot with and without contrast - ICD10: E11.621 - Non healing wound on left 4th metatarsal, rule out Osteomyeltitis Hand Signature: Date(s): Electronic Signature(s) Signed: 09/03/2020 4:59:32 PM By: [name] MD Signed: 09/03/2020 5:00:21 PM By: [name] RN, BSN Entered By: [name] on 09/03/2020 09:39:26 -------------------------------------------------------------------------------- Problem List Details Patient Name: Date of Service: [facility], [place] 12/04/9447 9:00 A M Medical Record Number: [id number] Patient Account Number: [id number] Date of Birth/Sex: Treating RN: Oct 27, 1965 (55 y.o. [name] Primary Care Provider: [name] Other Clinician: Referring Provider: Treating Provider/Extender: [name], [name] in Treatment: 2 Active Problems ICD-10 Encounter Code Description Active Date MDM Diagnosis E11.621 Type 2 diabetes mellitus with foot ulcer 08/20/2020 No Yes L97.528  Non-pressure chronic ulcer of other part of left foot with other specified 08/20/2020 No Yes severity Inactive Problems Resolved Problems Electronic Signature(s) Signed: 09/03/2020 4:59:32 PM By: [name] MD Entered By: [name] on 09/03/2020 09:51:09 -------------------------------------------------------------------------------- Progress Note Details Patient Name: Date of Service: [facility], [place] 11/06/5991 9:00 A M Medical Record Number: [id number] Patient Account Number: [id number] Date of Birth/Sex: Treating RN: [birth year] (55 y.o. [name] Primary Care Provider: [name] Other Clinician: Referring Provider: Treating Provider/Extender: [name], [name] in Treatment: 2 Subjective History of Present Illness (HPI) ADMISSION 05/04/2018 This is a 55 year old man who apparently is a type I diabetic confirmed by appropriate serology testing 6 years ago. He is therefore on insulin. His most recent hemoglobin A1c was 10.7 on 03/27/2018. 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 As a result of a combination of this he is insensate and even has reduced sensation in his upper extremities. His current problem started in early July he noted a blister over the left fourth plantar metatarsal head. He saw [name] his podiatrist on 01/07/2018. At that time he had a wound that measured 2 x 1 x 0.4 cm. An x-ray did not show osteomyelitis. It did not appear that he actually followed up. He was admitted to hospital on 03/25/2018 through 03/30/2018 with his left foot painful and swollen. He underwent a surgical IandD by [name]. I believe his culture at that time showed Enterococcus faecalis and a bone culture showed enterococcus avium. He is been reviewed by [name] of infectious disease  although I have not reviewed his note and he has been on 6 weeks worth of ertapenem which should finish sometime apparently on December 9.. MRI suggested possibility of septic arthritis but did not comment specifically on osteomyelitis. A more recent wound culture was negative. The patient has been referred here for consideration among other things of hyperbaric oxygen versus proceeding with a partial ray amputation. This would involve excision of the fourth metatarsal head. The patient is offloading this and a Darco forefoot off loader. I think he is using iodoform packing. 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 Certainly would be a possibility of a total contact cast. I did not see any arterial studies on him however his peripheral pulses are palpable 05/11/2018; patient started his hyperbarics on Friday but did not dive yesterday for personal issues. He saw [name] on 12//19 he notes that during the hospitalization in  October 2019 he had an abscess and underwent a bone biopsy with culture that showed group B strep, enterococcus avium and Enterococcus faecalis. There was Prevotella from the abscess. He was put on ertapenem for 6 weeks which he completed. Per [name] notable for the fact that he is not been treated with ampicillin or amoxicillin. His hemoglobin A1c was noted to be high at 10.7. He is started on ampicillin sulbactam every 6 again I think this is for better coverage for the enterococcus. We started him on silver alginate as there is still purulent looking drainage coming out of the wound and there is indeed exposed bone. The patient has not been unwell 05/18/2018; patient continues with hyperbarics he is tolerating this well and has no complaints. He is on Unasyn every 6. We have been using silver alginate to the area over the fourth plantar metatarsal head. He is a poorly controlled diabetic. There is certainly less purulent drainage than when I first  saw this man at the beginning of the month. He only sedimentation rate I see on him was 40 on 03/26/2018. States his blood sugars are under better control in the 120s to 130 range. Follows with his endocrinologist in [facility] I change his primary dressing to Prisma today attempting to increase the granulation. After I left the room he called [place] back into look at a area that he noticed on the right foot 2 days ago. Very superficial and in the medial aspect of his high plantar arch [secondary to his neuropathy] he has a custom insert in the shoe on the right foot. 05/24/2018; continues with HBO doing well. He saw infectious disease today and is still on Unasyn every 6 hours. Not making a lot of progress on the left foot with Prisma I changed him to silver alginate strips. The new area on the right midfoot he is putting silver alginate on this as well. 06/01/2018; patient remains on Unasyn. Not making a lot of progress with granulation. I am going to put him in a total contact cast use Prisma for now. He has a high co-pay for an advanced treatment option but I suspect feeling in this wound may come to that 06/04/2018; patient back for the obligatory first total contact cast change. We continue to use Prisma 06/08/2018; the patient is tolerating the cast well although he had some loosening over the tibia which rubbed when he walked. There is no lesion here. We have been using Prisma under the total contact cast. He is in hyperbarics which she is tolerating well 06/15/2018; tolerating total contact cast and hyperbarics well. He continues IV antibiotics and for the underlying osteomyelitis as directed by infectious disease. His wound is making really nice progress 1/21; Patient's wound is down in depth. Using silver collagen. Ab's end this week and Pic line is to be d/ced 1/28; 0.8 cm in depth which is more than last time. We have been using silver collagen under total contact cast. He continues in  hyperbarics. His IV antibiotics are finished PICC line has been removed 2/4; surprisingly today the area is actually epithelialized. There is no open area. There is however a divot where the epithelium is gone down to close the wound surface. I am not completely convinced that this is going to remain viable over time but I think time is the best thing to determine this. There is no evidence of surrounding infection. As of today I do not think a repeat MRI or any other procedure  is going to be indicative of whether this is going to maintain a closed state. I am going to continue him in a total contact cast. He will complete HBO. Next week I will allow him perhaps to graduate into a Darco forefoot off loader, then a healing sandal. I will then send him back to podiatry to see if they can fashion an offloading shoe if this area remains closed 2/10; the patient was taken out of the cast today the area is healed. He has a divot however this is fully epithelialized and the epithelialized tissue looks healthy. I have put him into a Darco forefoot off loader. He will complete his last 2 hyperbaric treatments. I will see him next week and I may transition him into a surgical shoe versus his own shoe. He is following up with [name] of podiatry and I think a diabetic shoe with custom inserts could be provided with the signature provided by his endocrinologist ADMISSION 6/22 CLINICAL DATA: Foot ulcer, question of osteomyelitis EXAM: MRI OF THE LEFT FOOT WITHOUT CONTRAST TECHNIQUE: Multiplanar, multisequence MR imaging of the left was performed. No intravenous contrast was administered. COMPARISON: Radiograph same day FINDINGS: Bones/Joint/Cartilage Small cystic changes are seen at the fifth metatarsal head, likely degenerative changes. No marrow edema, osseous fracture, or periosteal reaction are seen throughout the osseous structures. Small joint effusion seen at the fifth MTP joint. There appears  to be chronic cortical irregularity with erosive type change seen at the fourth metatarsal head which is unchanged since June 28, 2019, however new since 2019. Ligaments The Lisfranc ligaments and collateral ligaments appear to be intact. Muscles and Tendons Diffusely increased signal seen throughout the muscles surrounding the forefoot with fatty atrophy. The flexor and extensor tendons appear to be intact. The plantar fascia is intact. Soft tissues Along the dorsal lateral aspect of the fifth metatarsal shaft there is a focal area superficial ulceration measuring approximately 8 mm in transverse dimension. There is overlying skin thickening and subcutaneous edema seen. No sinus tract or loculated fluid collection is noted. IMPRESSION: 1. Small focal area of ulceration along the dorsal lateral aspect of the fifth metatarsal shaft. No definite evidence of acute osteomyelitis, sinus tract, or abscess. 2. Progressive chronic erosive cortical destruction seen at the fourth metatarsal head since 2019, however unchanged since June 28, 2019, which could be from chronic osteomyelitis or prior injury. 3. Small joint effusion at the fifth MTP joint. Electronically Signed By: Prudencio Pair M.D. On: 08/02/2019 48:90 55 year old type I diabetic who we had in clinic in late 2019 to February 2020 with osteomyelitis of his left fourth metatarsal head and a wound in this area. This eventually healed with IV antibiotics and hyperbaric oxygen. He was relatively well until February of this year when he developed I believe 2 wounds on the left fifth met head 1 plantar and 1 more laterally. The one more laterally has since closed over but he has been left with a fairly persistent wound in the plantar left fifth met head. Has been following with [name] of podiatry. For an extended period of time he used Medihoney. He received doxycycline and April at which time a culture showed E. coli and MSSA. An  MRI done on August 02, 2019 [shown above] did not show osteomyelitis but did suggest some ongoing destruction at the fourth metatarsal head. He was switched to silver alginate as the primary dressing in May. Earlier this month he received a 2-week course of Bactrim due to erythema  around the wound. He was referred to Dr. Faythe Ghee of infectious disease who did not think anything was infected and recommended stopping antibiotics. The patient said he felt really well has you finished up the Bactrim but then yesterday morning he became febrile again and noted more erythema around the wound. He was referred here for our review of this. His temperature today is 99.3. He denies any other infectious symptoms no cough no upper respiratory, no dysuria etc. He has been using silver alginate and offloading this area with a surgical shoe. 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 He apparently had shoulder surgery in the fall 2020 I did not look this up he apparently has hardware in this area. Recent x-ray done in Dr. Kenna Gilbert office on 6/7 was negative for osteo- ABIs have always been noncompressible. Is not previously been thought that arterial insufficiency is contributing to this. 6/29; patient returns the clinic. I thought he might be going to see Dr. Loreta Ave ongoing for this wound but he is back today. He did see Dr. Loreta Ave on 11/22/2019 I believe he agreed with the MRI and apparently was making some arrangements to have this done in [facility] which I am fine with if they can compare with the study of March 2. In any case he returns with a clinic of the wound is not as good. He has been using silver alginate. He is still on another antibiotic that he says Dr. Loreta Ave prescribed 7/8; the patient had his MRI which showed a progressive soft tissue ulceration plantar to the  fifth MTP joint with increased soft tissue enhancement throughout the small toe no abscess was noted. There was progressive signal changes in the fifth metatarsal head and fifth proximal phalanx without cortical destruction or definite abnormal T1 signal. These findings are nonspecific could be secondary to hyperemia or early osteomyelitis. He is on amoxicillin as directed by Dr. Loreta Ave. There were plans to do a bone biopsy and culture done in the OR on Friday however the patient canceled this because of insurance issues [surgical center not in networko]. In any case I have told him that this procedure in my opinion needs to be done. He is using Silvadene cream READMISSION 02/07/2020 This is a patient who had a progressive ulceration over the fifth MTP. We had him here in June and into the early July. He was also followed by Dr. Ardelle Anton of podiatry. The day after we last saw him on 7/9 he went underwent a excision of the fifth MTP to aid in wound healing over this area. Pathology did not show osteomyelitis. Since then he has been following with Dr. Loreta Ave. Notable that earlier this month he presented with worse wound with purulence. CandS showed MSSA and he was treated with doxycycline and Silvadene cream. More recently he has been changed to Augmentin. He is using Betadine to the wound. He has a forefoot offloading shoe at some point in the postoperative. This wound opened in the surgical line. He is here for our review of this. With regards to his diabetes I spent some time on this today. This was diagnosed in his mid 22s he was on pills for a while and then on insulin. When he was here last time he told me that he thought he had had antipancreatic antibodies that showed he was type I if so I do not see this.  He followed for a period of time with Dr. Meredith Pel endocrinology at [facility] who I gather is currently independent practice in [facility]. I have looked through her notes as it applies to what is  available in care everywhere and she consistently calls him a type II diabetic. If he had other studies of his diabetes I do not see this. I have therefore changed my label of him to type II ABI in our clinic was 1.41 on the left 9/13; arrives in clinic today with things not looking as good. He has a small linear wound in the incision line but nonviable rolled edges and debris around the circumference. As well it looks as though he had a deep tissue injury over the base of the fifth metatarsal. Raised may be blistered but no open area here. Also concerning erythema around the wound both distally and proximally 9/20; PCR culture I did last time showed high Enterococcus faecalis, medium MRSA and 3 different anaerobic's including medium Peptostreptococcus magnus. I gave him a combination of Augmentin and trimethoprim sulfamethoxazole starting on 9/16 for 10 days. He is tolerating these well He comes in today. His original wound area at the surgical site only has a small crevice we are using endoform here. Not surprisingly the area over the base of the fifth metatarsal has open with a superficial wound. He has a wrap/surgical shoe injury dorsally on the left foot he is asking about a total contact cast and I think what I know about this area suggests that the lateral part of his foot is a weightbearing surface and I think we could certainly justify this. 9/27; patient is finishing his antibiotics. His surgical wound is actually closed the area over the base of the left fifth metatarsal of is also closed but still with some eschar. 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 As such he actually walks on the outside of his foot. He has been in touch with [facility] Liliane Channel is their orthotic specialist READMISSION 08/20/2020 This is a 55 year old man who is a type II diabetic. He also has longstanding peripheral neuropathy largely  secondary to Charcot-Marie-T ooth disease. He has been in this clinic before with wounds over his left fifth and left fourth met head. He has a history of osteomyelitis in the left fifth met head requiring an excision of the fifth MTP.. The patient tells [place] that he has an AFO brace now to correct the ankle deformity. Sometime in February he developed a callus over the fourth metatarsal head ultimately Dr. Earleen Newport open this area to expose the wound. He used Silvadene cream for a while most recently Promogran. He has had antibiotics as well including doxycycline and apparently currently Augmentin although I am not certain there is been any cultures. He states the area has not been x-rayed. 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 The met head here is subluxed. His x-ray that we ordered last week was negative for osteomyelitis. Clinically there does not appear to be infection here. No erythema no drainage the wound does not probe to bone 4/4; put a total contact cast on him last week primary dressing of silver collagen he arrives in clinic today with a  wound measuring larger and as opposed to last week a direct probe to bone. He is definitely going to need an MRI Objective Constitutional Sitting or standing Blood Pressure is within target range for patient.. Pulse regular and within target range for patient.Marland Kitchen Respirations regular, non-labored and within target range.. Temperature is normal and within the target range for the patient.Marland Kitchen Appears in no distress. Vitals Time Taken: 9:12 AM, Height: 76 in, Weight: 260 lbs, BMI: 31.6, Temperature: 98.9 F, Pulse: 81 bpm, Respiratory Rate: 18 breaths/min, Blood Pressure: 130/80 mmHg, Capillary Blood Glucose: 176 mg/dl. General Notes: Wound exam; left fourth metatarsal  head. Unfortunately the deep part of the central part of this wound now probes to bone. I used a #3 curette to remove necrotic debris from this area and there was exposed bone. This is quite a bit worse than last week. There is no overt purulence Integumentary (Hair, Skin) Wound #6 status is Open. Original cause of wound was Gradually Appeared. The date acquired was: 07/03/2020. The wound has been in treatment 2 weeks. The wound is located on the Left Metatarsal head fourth. The wound measures 1.2cm length x 1.6cm width x 0.8cm depth; 1.508cm^2 area and 1.206cm^3 volume. There is Fat Layer (Subcutaneous Tissue) exposed. There is no tunneling or undermining noted. There is a medium amount of serosanguineous drainage noted. The wound margin is well defined and not attached to the wound base. There is large (67-100%) pink, pale granulation within the wound bed. There is a small (1- 33%) amount of necrotic tissue within the wound bed including Adherent Slough. Assessment Active Problems ICD-10 Type 2 diabetes mellitus with foot ulcer Non-pressure chronic ulcer of other part of left foot with other specified severity Procedures Wound #6 Pre-procedure diagnosis of Wound #6 is a Diabetic Wound/Ulcer of the Lower Extremity located on the Left Metatarsal head fourth .Severity of Tissue Pre Debridement is: Fat layer exposed. There was a Excisional Skin/Subcutaneous Tissue Debridement with a total area of 1.92 sq cm performed by [name]., MD. With the following instrument(s): Curette to remove Viable and Non-Viable tissue/material. Material removed includes Subcutaneous Tissue and Slough and. No specimens were taken. A time out was conducted at 09:36, prior to the start of the procedure. A Minimum amount of bleeding was controlled with Pressure. The procedure was tolerated well with a pain level of 0 throughout and a pain level of 0 following the procedure. Post Debridement Measurements: 1.2cm  length x 1.6cm width x 0.8cm depth; 1.206cm^3 volume. Character of Wound/Ulcer Post Debridement is stable. Severity of Tissue Post Debridement is: Fat layer exposed. Post procedure Diagnosis Wound #6: Same as Pre-Procedure Pre-procedure diagnosis of Wound #6 is a Diabetic Wound/Ulcer of the Lower Extremity located on the Left Metatarsal head fourth . There was a T Contact otal Cast Procedure by [name]., MD. Post procedure Diagnosis Wound #6: Same as Pre-Procedure Plan Follow-up Appointments: Return Appointment in 1 week. Bathing/ Shower/ Hygiene: May shower and wash wound with soap and water. - on days that dressing is changed Off-Loading: T Contact Cast to Left Lower Extremity otal Radiology ordered were: MRI, left foot with and without contrast - Non healing wound on left 4th metatarsal, rule out Osteomyeltitis WOUND #6: - Metatarsal head fourth Wound Laterality: Left Cleanser: Wound Cleanser 1 x Per Week/ Discharge Instructions: Cleanse the wound with wound cleanser prior to applying a clean dressing using gauze sponges, not tissue or cotton balls. Prim Dressing: Promogran Prisma Matrix, 4.34 (sq in) (silver collagen) (Generic)  1 x Per Week/ ary Discharge Instructions: Moisten collagen with saline or hydrogel Secondary Dressing: Woven Gauze Sponges 2x2 in (Generic) 1 x Per Week/ Discharge Instructions: Apply over primary dressing as directed. Secondary Dressing: Drawtex 4x4 in 1 x Per Week/ Discharge Instructions: Cut drawtex to fit inside wound Secured With: 68M Medipore H Soft Cloth Surgical T 4 x 2 (in/yd) 1 x Per Week/ ape Discharge Instructions: Secure dressing with tape as directed. 1. The patient will need an MRI with contrast 2. This is because of the newly exposed bone this week 3. If the MRI shows osteomyelitis it will be possible to get a specimen of bone 4. After some thought I decided to go ahead and continue with the cast. I do not want to risk further  deterioration Electronic Signature(s) Signed: 09/03/2020 4:59:32 PM By: [name] MD Entered By: [name] on 09/03/2020 09:55:55 -------------------------------------------------------------------------------- Total Contact Cast Details Patient Name: Date of Service: [facility], [place] 08/03/3830 9:00 [place] Record Number: [id number] Patient Account Number: [id number] Date of Birth/Sex: Treating RN: [birth year] (55 y.o. [name] Primary Care Provider: [name] Other Clinician: Referring Provider: Treating Provider/Extender: Elwyn Lade in Treatment: 2 T Contact Cast Applied for Wound Assessment: otal Wound #6 Left Metatarsal head fourth Performed By: Physician [name]., MD Post Procedure Diagnosis Same as Pre-procedure Electronic Signature(s) Signed: 09/03/2020 4:59:32 PM By: [name] MD Entered By: [name] on 09/03/2020 09:51:39 -------------------------------------------------------------------------------- SuperBill Details Patient Name: Date of Service: [facility] [place] 0/09/5995 Medical Record Number: [id number] Patient Account Number: [id number] Date of Birth/Sex: Treating RN: [birth year] (55 y.o. [name] Primary Care Provider: [name] Other Clinician: Referring Provider: Treating Provider/Extender: [name], [name] in Treatment: 2 Diagnosis Coding ICD-10 Codes Code Description [phone] Type 2 diabetes mellitus with foot ulcer L97.528 Non-pressure chronic ulcer of other part of left foot with other specified severity Facility Procedures CPT4 Code: [id number] Description: 37290 - DEB SUBQ TISSUE 20 SQ CM/< ICD-10 Diagnosis Description L97.528 Non-pressure chronic ulcer of other part of left foot with other specified seve E11.621 Type 2 diabetes mellitus with foot ulcer Modifier: rity Quantity: 1 Physician Procedures Electronic  Signature(s) Signed: 09/03/2020 4:59:32 PM By: [name] MD Entered By: [name] on 09/03/2020 09:56:08

## 2020-09-05 NOTE — Progress Notes (Signed)
[name] ([id number]) Visit Report for 09/03/2020 Arrival Information Details Patient Name: Date of Service: [facility], [place] 09/03/2020 9:00 A M Medical Record Number: [id number] Patient Account Number: [id number] Date of Birth/Sex: Treating RN: [birth year] (55 y.o. [name] Primary Care Azelie Noguera: Ananias Pilgrim Other Clinician: Referring Mekenna Finau: Treating Shirley Decamp/Extender: Alta Corning in Treatment: 2 Visit Information History Since Last Visit Added or deleted any medications: No Patient Arrived: Ambulatory Any new allergies or adverse reactions: No Arrival Time: 09:11 Had a fall or experienced change in No Accompanied By: self activities of daily living that may affect Transfer Assistance: None risk of falls: Patient Identification Verified: Yes Signs or symptoms of abuse/neglect since last visito No Secondary Verification Process Completed: Yes Hospitalized since last visit: No Implantable device outside of the clinic excluding No cellular tissue based products placed in the center since last visit: Has Dressing in Place as Prescribed: Yes Pain Present Now: No Electronic Signature(s) Signed: 09/03/2020 10:23:55 AM By: Karl Ito Entered By: Karl Ito on 09/03/2020 09:11:48 -------------------------------------------------------------------------------- Encounter Discharge Information Details Patient Name: Date of Service: [name], Erik Erik B. 09/03/2020 9:00 A M Medical Record Number: [id number] Patient Account Number: [id number] Date of Birth/Sex: Treating RN: [birth year] (55 y.o. [name] Primary Care Dolora Ridgely: Ananias Pilgrim Other Clinician: Referring Jaxton Casale: Treating Taira Knabe/Extender: Alta Corning in Treatment: 2 Encounter Discharge Information Items Post Procedure Vitals Discharge Condition: Stable Temperature (F): 98.9 Ambulatory Status: Ambulatory Pulse (bpm):  81 Discharge Destination: Home Respiratory Rate (breaths/min): 18 Transportation: Private Auto Blood Pressure (mmHg): 130/80 Schedule Follow-up Appointment: Yes Clinical Summary of Care: Provided on 09/03/2020 Form Type Recipient Paper Patient Patient Electronic Signature(s) Signed: 09/03/2020 10:02:00 AM By: Antonieta Iba Entered By: Antonieta Iba on 09/03/2020 10:02:00 -------------------------------------------------------------------------------- Lower Extremity Assessment Details Patient Name: Date of Service: [name], Erik Erik B. 09/03/2020 9:00 A M Medical Record Number: [id number] Patient Account Number: [id number] Date of Birth/Sex: Treating RN: 01-15-1966 (55 y.o. [name] Primary Care Hershall Benkert: Ananias Pilgrim Other Clinician: Referring Daya Dutt: Treating Dallyn Bergland/Extender: [name], [name] in Treatment: 2 Edema Assessment Assessed: Kyra Searles: No] Franne Forts: No] Edema: [Left: N] [Right: o] Calf Left: Right: Point of Measurement: 33 cm From Medial Instep 38 cm Ankle Left: Right: Point of Measurement: 8 cm From Medial Instep 22 cm Electronic Signature(s) Signed: 09/05/2020 5:28:50 PM By: Fonnie Mu RN Entered By: Fonnie Mu on 09/03/2020 09:29:28 -------------------------------------------------------------------------------- Multi Wound Chart Details Patient Name: Date of Service: [name], Erik Erik B. 09/03/2020 9:00 A M Medical Record Number: [id number] Patient Account Number: [id number] Date of Birth/Sex: Treating RN: 30-Apr-1966 (55 y.o. [name] Primary Care Pinkney Venard: Ananias Pilgrim Other Clinician: Referring Khali Perella: Treating Jacee Enerson/Extender: [name], [name] in Treatment: 2 Vital Signs Height(in): 76 Capillary Blood Glucose(mg/dl): 244 Weight(lbs): 010 Pulse(bpm): 81 Body Mass Index(BMI): 32 Blood Pressure(mmHg): 130/80 Temperature(F): 98.9 Respiratory Rate(breaths/min):  18 Photos: [6:No Photos Left Metatarsal head fourth] [N/A:N/A N/A] Wound Location: [6:Gradually Appeared] [N/A:N/A] Wounding Event: [6:Diabetic Wound/Ulcer of the Lower] [N/A:N/A] Primary Etiology: [6:Extremity Cataracts, Hypertension, Type II] [N/A:N/A] Comorbid History: [6:Diabetes, Osteomyelitis, Neuropathy 07/03/2020] [N/A:N/A] Date Acquired: [6:2] [N/A:N/A] Weeks of Treatment: [6:Open] [N/A:N/A] Wound Status: [6:1.2x1.6x0.8] [N/A:N/A] Measurements L x W x D (cm) [6:1.508] [N/A:N/A] A (cm) : rea [6:1.206] [N/A:N/A] Volume (cm) : [6:-6.60%] [N/A:N/A] % Reduction in Area: [6:-113.50%] [N/A:N/A] % Reduction in Volume: [6:Grade 1] [N/A:N/A] Classification: [6:Medium] [N/A:N/A] Exudate Amount: [6:Serosanguineous] [N/A:N/A] Exudate Type: [6:red, brown] [N/A:N/A] Exudate Color: [6:Well defined, not attached] [  N/A:N/A] Wound Margin: [6:Large (67-100%)] [N/A:N/A] Granulation Amount: [6:Pink, Pale] [N/A:N/A] Granulation Quality: [6:Small (1-33%)] [N/A:N/A] Necrotic Amount: [6:Fat Layer (Subcutaneous Tissue): Yes N/A] Exposed Structures: [6:Fascia: No Tendon: No Muscle: No Joint: No Bone: No Small (1-33%)] [N/A:N/A] Epithelialization: [6:Debridement - Excisional] [N/A:N/A] Debridement: Pre-procedure Verification/Time Out 09:36 [N/A:N/A] Taken: [6:Subcutaneous, Slough] [N/A:N/A] Tissue Debrided: [6:Skin/Subcutaneous Tissue] [N/A:N/A] Level: [6:1.92] [N/A:N/A] Debridement A (sq cm): [6:rea Curette] [N/A:N/A] Instrument: [6:Minimum] [N/A:N/A] Bleeding: [6:Pressure] [N/A:N/A] Hemostasis A chieved: [6:0] [N/A:N/A] Procedural Pain: [6:0] [N/A:N/A] Post Procedural Pain: [6:Procedure was tolerated well] [N/A:N/A] Debridement Treatment Response: [6:1.2x1.6x0.8] [N/A:N/A] Post Debridement Measurements L x W x D (cm) [6:1.206] [N/A:N/A] Post Debridement Volume: (cm) [6:Debridement] [N/A:N/A] Procedures Performed: [6:T Contact Cast otal] Treatment Notes Electronic Signature(s) Signed:  09/03/2020 4:59:32 PM By: Baltazar Najjar MD Signed: 09/03/2020 5:00:21 PM By: Zandra Abts RN, BSN Entered By: Baltazar Najjar on 09/03/2020 09:51:18 -------------------------------------------------------------------------------- Multi-Disciplinary Care Plan Details Patient Name: Date of Service: [facility], Erik Erik B. 09/03/2020 9:00 A M Medical Record Number: [id number] Patient Account Number: [id number] Date of Birth/Sex: Treating RN: Aug 10, 1965 (55 y.o. [name] Primary Care Bernhardt Riemenschneider: Ananias Pilgrim Other Clinician: Referring Elley Harp: Treating Rue Tinnel/Extender: Alta Corning in Treatment: 2 Multidisciplinary Care Plan reviewed with physician Active Inactive Nutrition Nursing Diagnoses: Impaired glucose control: actual or potential Potential for alteratiion in Nutrition/Potential for imbalanced nutrition Goals: Patient/caregiver agrees to and verbalizes understanding of need to use nutritional supplements and/or vitamins as prescribed Date Initiated: 08/20/2020 Target Resolution Date: 09/21/2020 Goal Status: Active Patient/caregiver will maintain therapeutic glucose control Date Initiated: 08/20/2020 Target Resolution Date: 09/21/2020 Goal Status: Active Interventions: Assess HgA1c results as ordered upon admission and as needed Assess patient nutrition upon admission and as needed per policy Provide education on elevated blood sugars and impact on wound healing Provide education on nutrition Treatment Activities: Education provided on Nutrition : 08/27/2020 Notes: Wound/Skin Impairment Nursing Diagnoses: Impaired tissue integrity Knowledge deficit related to ulceration/compromised skin integrity Goals: Patient/caregiver will verbalize understanding of skin care regimen Date Initiated: 08/20/2020 Target Resolution Date: 09/21/2020 Goal Status: Active Ulcer/skin breakdown will have a volume reduction of 30% by week 4 Date Initiated:  08/20/2020 Target Resolution Date: 09/21/2020 Goal Status: Active Interventions: Assess patient/caregiver ability to obtain necessary supplies Assess patient/caregiver ability to perform ulcer/skin care regimen upon admission and as needed Assess ulceration(s) every visit Provide education on ulcer and skin care Notes: Electronic Signature(s) Signed: 09/03/2020 5:00:21 PM By: Zandra Abts RN, BSN Entered By: Zandra Abts on 09/03/2020 09:20:41 -------------------------------------------------------------------------------- Pain Assessment Details Patient Name: Date of Service: [facility], Erik Erik B. 09/03/2020 9:00 A M Medical Record Number: [id number] Patient Account Number: [id number] Date of Birth/Sex: Treating RN: 01-07-1966 (55 y.o. [name] Primary Care Cadon Raczka: Ananias Pilgrim Other Clinician: Referring Shamari Lofquist: Treating Peace Noyes/Extender: [name], [name] in Treatment: 2 Active Problems Location of Pain Severity and Description of Pain Patient Has Paino No Site Locations Pain Management and Medication Current Pain Management: Electronic Signature(s) Signed: 09/03/2020 10:23:55 AM By: Karl Ito Signed: 09/03/2020 5:00:21 PM By: Zandra Abts RN, BSN Entered By: Karl Ito on 09/03/2020 09:12:40 -------------------------------------------------------------------------------- Patient/Caregiver Education Details Patient Name: Date of Service: [facility], Erik Jilda Panda 4/4/2022andnbsp9:00 A M Medical Record Number: [id number] Patient Account Number: [id number] Date of Birth/Gender: Treating RN: 01/31/1966 (54 y.o. [name] Primary Care Physician: Ananias Pilgrim Other Clinician: Referring Physician: Treating Physician/Extender: Alta Corning in Treatment: 2 Education Assessment Education Provided To: Patient Education Topics Provided Wound/Skin Impairment: Methods: Explain/Verbal Responses:  State content correctly Electronic Signature(s) Signed: 09/03/2020 5:00:21 PM By: Zandra Abts RN, BSN Entered By: Zandra Abts on 09/03/2020 09:20:57 -------------------------------------------------------------------------------- Wound Assessment Details Patient Name: Date of Service: [facility], Erik Erik B. 09/03/2020 9:00 A M Medical Record Number: [id number] Patient Account Number: [id number] Date of Birth/Sex: Treating RN: [birth year] (55 y.o. [name] Primary Care Concepcion Gillott: Ananias Pilgrim Other Clinician: Referring Aleck Locklin: Treating Loralyn Rachel/Extender: [name], [name] in Treatment: 2 Wound Status Wound Number: 6 Primary Diabetic Wound/Ulcer of the Lower Extremity Etiology: Wound Location: Left Metatarsal head fourth Wound Status: Open Wounding Event: Gradually Appeared Comorbid Cataracts, Hypertension, Type II Diabetes, Osteomyelitis, Date Acquired: 07/03/2020 History: Neuropathy Weeks Of Treatment: 2 Clustered Wound: No Wound Measurements Length: (cm) 1.2 Width: (cm) 1.6 Depth: (cm) 0.8 Area: (cm) 1.508 Volume: (cm) 1.206 % Reduction in Area: -6.6% % Reduction in Volume: -113.5% Epithelialization: Small (1-33%) Tunneling: No Undermining: No Wound Description Classification: Grade 1 Wound Margin: Well defined, not attached Exudate Amount: Medium Exudate Type: Serosanguineous Exudate Color: red, brown Foul Odor After Cleansing: No Slough/Fibrino Yes Wound Bed Granulation Amount: Large (67-100%) Exposed Structure Granulation Quality: Pink, Pale Fascia Exposed: No Necrotic Amount: Small (1-33%) Fat Layer (Subcutaneous Tissue) Exposed: Yes Necrotic Quality: Adherent Slough Tendon Exposed: No Muscle Exposed: No Joint Exposed: No Bone Exposed: No Treatment Notes Wound #6 (Metatarsal head fourth) Wound Laterality: Left Cleanser Wound Cleanser Discharge Instruction: Cleanse the wound with wound cleanser prior to applying a  clean dressing using gauze sponges, not tissue or cotton balls. Peri-Wound Care Topical Primary Dressing Promogran Prisma Matrix, 4.34 (sq in) (silver collagen) Discharge Instruction: Moisten collagen with saline or hydrogel Secondary Dressing Woven Gauze Sponges 2x2 in Discharge Instruction: Apply over primary dressing as directed. Drawtex 4x4 in Discharge Instruction: Cut drawtex to fit inside wound Secured With 19M Medipore H Soft Cloth Surgical T 4 x 2 (in/yd) ape Discharge Instruction: Secure dressing with tape as directed. Compression Wrap Compression Stockings Add-Ons Electronic Signature(s) Signed: 09/05/2020 5:28:50 PM By: Fonnie Mu RN Entered By: Fonnie Mu on 09/03/2020 09:29:52 -------------------------------------------------------------------------------- Vitals Details Patient Name: Date of Service: [facility], Erik Erik B. 09/03/2020 9:00 A M Medical Record Number: [id number] Patient Account Number: [id number] Date of Birth/Sex: Treating RN: [birth year] (55 y.o. [name] Primary Care Geoffery Aultman: Ananias Pilgrim Other Clinician: Referring Kelvin Sennett: Treating Avree Szczygiel/Extender: [name], [name] in Treatment: 2 Vital Signs Time Taken: 09:12 Temperature (F): 98.9 Height (in): 76 Pulse (bpm): 81 Weight (lbs): 260 Respiratory Rate (breaths/min): 18 Body Mass Index (BMI): 31.6 Blood Pressure (mmHg): 130/80 Capillary Blood Glucose (mg/dl): 431 Reference Range: 80 - 120 mg / dl Electronic Signature(s) Signed: 09/03/2020 10:23:55 AM By: Karl Ito Entered By: Karl Ito on 09/03/2020 09:13:33

## 2020-09-10 ENCOUNTER — Encounter (HOSPITAL_BASED_OUTPATIENT_CLINIC_OR_DEPARTMENT_OTHER): Payer: BLUE CROSS/BLUE SHIELD | Admitting: Internal Medicine

## 2020-09-10 ENCOUNTER — Other Ambulatory Visit: Payer: Self-pay

## 2020-09-10 DIAGNOSIS — E10621 Type 1 diabetes mellitus with foot ulcer: Secondary | ICD-10-CM | POA: Diagnosis not present

## 2020-09-10 NOTE — Progress Notes (Signed)
[name], [name] ([id number]) Visit Report for 09/10/2020 HPI Details Patient Name: Date of Service: [facility], [place] 4/49/6759 9:00 A M Medical Record Number: [id number] Patient Account Number: [id number] Date of Birth/Sex: Treating RN: 1965-06-08 (55 y.o. [name] Primary Care Provider: [name] Other Clinician: Referring Provider: Treating Provider/Extender: [name], [name] in Treatment: 3 History of Present Illness HPI Description: ADMISSION 05/04/2018 This is a 55 year old man who apparently is a type I diabetic confirmed by appropriate serology testing 6 years ago. He is therefore on insulin. His most recent hemoglobin A1c was 10.7 on 03/27/2018. 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 As a result of a combination of this he is insensate and even has reduced sensation in his upper extremities. His current problem started in early July he noted a blister over the left fourth plantar metatarsal head. He saw [name] his podiatrist on 01/07/2018. At that time he had a wound that measured 2 x 1 x 0.4 cm. An x-ray did not show osteomyelitis. It did not appear that he actually followed up. He was admitted to hospital on 03/25/2018 through 03/30/2018 with his left foot painful and swollen. He underwent a surgical IandD by [name]. I believe his culture at that time showed Enterococcus faecalis and a bone culture showed enterococcus avium. He is been reviewed by [name] of infectious disease although I have not reviewed his note and he has been on 6 weeks worth of ertapenem which should finish sometime apparently on December 9.. MRI suggested possibility of septic arthritis but did not comment specifically on osteomyelitis. A more recent wound culture was negative. The patient has been  referred here for consideration among other things of hyperbaric oxygen versus proceeding with a partial ray amputation. This would involve excision of the fourth metatarsal head. The patient is offloading this and a Darco forefoot off loader. I think he is using iodoform packing. 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 Certainly would be a possibility of a total contact cast. I did not see any arterial studies on him however his peripheral pulses are palpable 05/11/2018; patient started his hyperbarics on Friday but did not dive yesterday for personal issues. He saw [name] on 12//19 he notes that during the hospitalization in October 2019 he had an abscess and underwent a bone biopsy with culture that showed group B strep, enterococcus avium and Enterococcus faecalis. There was Prevotella from the abscess. He was put on ertapenem for 6 weeks which he completed. Per [name] notable for the fact that he is not been treated with ampicillin or amoxicillin. His hemoglobin A1c was noted to be high at 10.7. He is started on ampicillin sulbactam every 6 again I think this is for better coverage for the enterococcus. We started him on silver alginate as there is still purulent looking drainage coming out of the wound and there is indeed exposed bone. The patient has not been unwell 05/18/2018; patient continues with hyperbarics he is tolerating this well and has no complaints. He is on Unasyn every 6. We have been using silver alginate to the area over the fourth plantar metatarsal head. He is a poorly controlled diabetic. There is certainly less purulent drainage than when I first saw this man at the beginning of the month. He only sedimentation rate I see on  him was 40 on 03/26/2018. States his blood sugars are under better control in the 120s to 130 range. Follows with his endocrinologist in [facility] I change his primary dressing to Prisma today attempting to increase the  granulation. After I left the room he called [place] back into look at a area that he noticed on the right foot 2 days ago. Very superficial and in the medial aspect of his high plantar arch [secondary to his neuropathy] he has a custom insert in the shoe on the right foot. 05/24/2018; continues with HBO doing well. He saw infectious disease today and is still on Unasyn every 6 hours. Not making a lot of progress on the left foot with Prisma I changed him to silver alginate strips. The new area on the right midfoot he is putting silver alginate on this as well. 06/01/2018; patient remains on Unasyn. Not making a lot of progress with granulation. I am going to put him in a total contact cast use Prisma for now. He has a high co-pay for an advanced treatment option but I suspect feeling in this wound may come to that 06/04/2018; patient back for the obligatory first total contact cast change. We continue to use Prisma 06/08/2018; the patient is tolerating the cast well although he had some loosening over the tibia which rubbed when he walked. There is no lesion here. We have been using Prisma under the total contact cast. He is in hyperbarics which she is tolerating well 06/15/2018; tolerating total contact cast and hyperbarics well. He continues IV antibiotics and for the underlying osteomyelitis as directed by infectious disease. His wound is making really nice progress 1/21; Patient's wound is down in depth. Using silver collagen. Ab's end this week and Pic line is to be d/ced 1/28; 0.8 cm in depth which is more than last time. We have been using silver collagen under total contact cast. He continues in hyperbarics. His IV antibiotics are finished PICC line has been removed 2/4; surprisingly today the area is actually epithelialized. There is no open area. There is however a divot where the epithelium is gone down to close the wound surface. I am not completely convinced that this is going to remain viable  over time but I think time is the best thing to determine this. There is no evidence of surrounding infection. As of today I do not think a repeat MRI or any other procedure is going to be indicative of whether this is going to maintain a closed state. I am going to continue him in a total contact cast. He will complete HBO. Next week I will allow him perhaps to graduate into a Darco forefoot off loader, then a healing sandal. I will then send him back to podiatry to see if they can fashion an offloading shoe if this area remains closed 2/10; the patient was taken out of the cast today the area is healed. He has a divot however this is fully epithelialized and the epithelialized tissue looks healthy. I have put him into a Darco forefoot off loader. He will complete his last 2 hyperbaric treatments. I will see him next week and I may transition him into a surgical shoe versus his own shoe. He is following up with [name] of podiatry and I think a diabetic shoe with custom inserts could be provided with the signature provided by his endocrinologist ADMISSION 6/22 CLINICAL DATA: Foot ulcer, question of osteomyelitis EXAM: MRI OF THE LEFT FOOT WITHOUT CONTRAST TECHNIQUE:  Multiplanar, multisequence MR imaging of the left was performed. No intravenous contrast was administered. COMPARISON: Radiograph same day FINDINGS: Bones/Joint/Cartilage Small cystic changes are seen at the fifth metatarsal head, likely degenerative changes. No marrow edema, osseous fracture, or periosteal reaction are seen throughout the osseous structures. Small joint effusion seen at the fifth MTP joint. There appears to be chronic cortical irregularity with erosive type change seen at the fourth metatarsal head which is unchanged since June 28, 2019, however new since 2019. Ligaments The Lisfranc ligaments and collateral ligaments appear to be intact. Muscles and Tendons Diffusely increased signal seen throughout  the muscles surrounding the forefoot with fatty atrophy. The flexor and extensor tendons appear to be intact. The plantar fascia is intact. Soft tissues Along the dorsal lateral aspect of the fifth metatarsal shaft there is a focal area superficial ulceration measuring approximately 8 mm in transverse dimension. There is overlying skin thickening and subcutaneous edema seen. No sinus tract or loculated fluid collection is noted. IMPRESSION: 1. Small focal area of ulceration along the dorsal lateral aspect of the fifth metatarsal shaft. No definite evidence of acute osteomyelitis, sinus tract, or abscess. 2. Progressive chronic erosive cortical destruction seen at the fourth metatarsal head since 2019, however unchanged since June 28, 2019, which could be from chronic osteomyelitis or prior injury. 3. Small joint effusion at the fifth MTP joint. Electronically Signed By: Prudencio Pair M.D. On: 08/02/2019 1:19 55 year old type I diabetic who we had in clinic in late 2019 to February 2020 with osteomyelitis of his left fourth metatarsal head and a wound in this area. This eventually healed with IV antibiotics and hyperbaric oxygen. He was relatively well until February of this year when he developed I believe 2 wounds on the left fifth met head 1 plantar and 1 more laterally. The one more laterally has since closed over but he has been left with a fairly persistent wound in the plantar left fifth met head. Has been following with [name] of podiatry. For an extended period of time he used Medihoney. He received doxycycline and April at which time a culture showed E. coli and MSSA. An MRI done on August 02, 2019 [shown above] did not show osteomyelitis but did suggest some ongoing destruction at the fourth metatarsal head. He was switched to silver alginate as the primary dressing in May. Earlier this month he received a 2-week course of Bactrim due to erythema around the wound. He was  referred to Dr. Alfred Levins of infectious disease who did not think anything was infected and recommended stopping antibiotics. The patient said he felt really well has you finished up the Bactrim but then yesterday morning he became febrile again and noted more erythema around the wound. He was referred here for our review of this. His temperature today is 99.3. He denies any other infectious symptoms no cough no upper respiratory, no dysuria etc. He has been using silver alginate and offloading this area with a surgical shoe. 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 He apparently had shoulder surgery in the fall 2020 I did not look this up he apparently has hardware in this area. Recent x-ray done in Dr. Pasty Arch office on 6/7 was negative for osteo- ABIs have always been noncompressible. Is not previously been thought that arterial insufficiency is contributing to this. 6/29; patient returns the clinic. I thought he might be going  to see Dr. Earleen Newport ongoing for this wound but he is back today. He did see Dr. Earleen Newport on 11/22/2019 I believe he agreed with the MRI and apparently was making some arrangements to have this done in [facility] which I am fine with if they can compare with the study of March 2. In any case he returns with a clinic of the wound is not as good. He has been using silver alginate. He is still on another antibiotic that he says Dr. Earleen Newport prescribed 7/8; the patient had his MRI which showed a progressive soft tissue ulceration plantar to the fifth MTP joint with increased soft tissue enhancement throughout the small toe no abscess was noted. There was progressive signal changes in the fifth metatarsal head and fifth proximal phalanx without cortical destruction or definite abnormal T1 signal. These findings are nonspecific could be secondary  to hyperemia or early osteomyelitis. He is on amoxicillin as directed by Dr. Earleen Newport. There were plans to do a bone biopsy and culture done in the OR on Friday however the patient canceled this because of insurance issues [surgical center not in [place]. In any case I have told him that this procedure in my opinion needs to be done. He is using Silvadene cream READMISSION 02/07/2020 This is a patient who had a progressive ulceration over the fifth MTP. We had him here in June and into the early July. He was also followed by [name] of podiatry. The day after we last saw him on 7/9 he went underwent a excision of the fifth MTP to aid in wound healing over this area. Pathology did not show osteomyelitis. Since then he has been following with Dr. Earleen Newport. Notable that earlier this month he presented with worse wound with purulence. CandS showed MSSA and he was treated with doxycycline and Silvadene cream. More recently he has been changed to Augmentin. He is using Betadine to the wound. He has a forefoot offloading shoe at some point in the postoperative. This wound opened in the surgical line. He is here for our review of this. With regards to his diabetes I spent some time on this today. This was diagnosed in his mid 100s he was on pills for a while and then on insulin. When he was here last time he told me that he thought he had had antipancreatic antibodies that showed he was type I if so I do not see this. He followed for a period of time with Dr. Meredith Pel endocrinology at [facility] who I gather is currently independent practice in [facility]. I have looked through her notes as it applies to what is available in care everywhere and she consistently calls him a type II diabetic. If he had other studies of his diabetes I do not see this. I have therefore changed my label of him to type II ABI in our clinic was 1.41 on the left 9/13; arrives in clinic today with things not looking as good. He has a  small linear wound in the incision line but nonviable rolled edges and debris around the circumference. As well it looks as though he had a deep tissue injury over the base of the fifth metatarsal. Raised may be blistered but no open area here. Also concerning erythema around the wound both distally and proximally 9/20; PCR culture I did last time showed high Enterococcus faecalis, medium MRSA and 3 different anaerobic's including medium Peptostreptococcus magnus. I gave him a combination of Augmentin and trimethoprim sulfamethoxazole starting  on 9/16 for 10 days. He is tolerating these well He comes in today. His original wound area at the surgical site only has a small crevice we are using endoform here. Not surprisingly the area over the base of the fifth metatarsal has open with a superficial wound. He has a wrap/surgical shoe injury dorsally on the left foot he is asking about a total contact cast and I think what I know about this area suggests that the lateral part of his foot is a weightbearing surface and I think we could certainly justify this. 9/27; patient is finishing his antibiotics. His surgical wound is actually closed the area over the base of the left fifth metatarsal of is also closed but still with some eschar. 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 As such he actually walks on the outside of his foot. He has been in touch with [facility] Liliane Channel is their orthotic specialist READMISSION 08/20/2020 This is a 55 year old man who is a type II diabetic. 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 He has been in this clinic before with wounds over his left fifth and left fourth met head. He has a history of osteomyelitis in the left fifth met head requiring an excision of the fifth MTP.. The patient tells [place] that he has an AFO brace now to correct the ankle  deformity. Sometime in February he developed a callus over the fourth metatarsal head ultimately Dr. Earleen Newport open this area to expose the wound. He used Silvadene cream for a while most recently Promogran. He has had antibiotics as well including doxycycline and apparently currently Augmentin although I am not certain there is been any cultures. He states the area has not been x-rayed. 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 The met head here is subluxed. His x-ray that we ordered last week was negative for osteomyelitis. Clinically there does not appear to be infection here. No erythema no drainage the wound does not probe to bone 4/4; put a total contact cast on him last week primary dressing of silver collagen he arrives in clinic today with a wound measuring larger and as opposed to last week a direct probe to bone. He is definitely going to need an MRI 4/11; miraculously the divot the probe to bone his filled in this week. Unfortunately his MRI is not till next Monday. We have been using silver collagen under a total contact cast. He has completed his antibiotics Electronic Signature(s) Signed: 09/10/2020 4:45:43 PM By: [name] MD Entered By: [name] on 09/10/2020 10:25:33 -------------------------------------------------------------------------------- Physical Exam Details Patient Name: Date of Service: [facility], [place] 1/51/7616 9:00 A M Medical Record Number: [id number] Patient Account Number: [id number] Date of Birth/Sex: Treating RN: 10/30/1965 (55 y.o. [name] Primary Care Provider: [name] Other Clinician: Referring Provider: Treating Provider/Extender: [name], [name] in Treatment: 3 Notes Wound exam; left fourth metatarsal head  miraculously the small superior divot that probes to bone last week is filled in nicely. The tissue here looks fairly good. No obvious surrounding infection no purulence Electronic Signature(s) Signed: 09/10/2020 4:45:43 PM By: [name] MD Entered By: [name] on 09/10/2020  10:34:03 -------------------------------------------------------------------------------- Physician Orders Details Patient Name: Date of Service: [facility], [place] 09/08/8117 9:00 A M Medical Record Number: [id number] Patient Account Number: [id number] Date of Birth/Sex: Treating RN: 1965-11-28 (55 y.o. [name] Primary Care Provider: Other Clinician: [name] Referring Provider: Treating Provider/Extender: Elwyn Lade in Treatment: 3 Verbal / Phone Orders: No Diagnosis Coding ICD-10 Coding Code Description E11.621 Type 2 diabetes mellitus with foot ulcer L97.528 Non-pressure chronic ulcer of other part of left foot with other specified severity Follow-up Appointments Return Appointment in 1 week. Bathing/ Shower/ Hygiene May shower and wash wound with soap and water. - on days that dressing is changed Off-Loading Total Contact Cast to Left Lower Extremity Wound Treatment Wound #6 - Metatarsal head fourth Wound Laterality: Left Cleanser: Wound Cleanser 1 x Per Week Discharge Instructions: Cleanse the wound with wound cleanser prior to applying a clean dressing using gauze sponges, not tissue or cotton balls. Prim Dressing: Promogran Prisma Matrix, 4.34 (sq in) (silver collagen) (Generic) 1 x Per Week ary Discharge Instructions: Moisten collagen with saline or hydrogel Secondary Dressing: Woven Gauze Sponges 2x2 in (Generic) 1 x Per Week Discharge Instructions: Apply over primary dressing as directed. Secondary Dressing: Drawtex 4x4 in 1 x Per Week Discharge Instructions: Cut drawtex to fit inside wound Secured With: 37M Medipore H Soft Cloth Surgical  T 4 x 2 (in/yd) 1 x Per Week ape Discharge Instructions: Secure dressing with tape as directed. Electronic Signature(s) Signed: 09/10/2020 4:45:43 PM By: [name] MD Signed: 09/10/2020 5:30:45 PM By: [name] RN, BSN Entered By: [name] on 09/10/2020 10:22:03 -------------------------------------------------------------------------------- Problem List Details Patient Name: Date of Service: [facility], [place]. 07/02/8655 9:00 A M Medical Record Number: [id number] Patient Account Number: [id number] Date of Birth/Sex: Treating RN: 09-Jun-1965 (54 y.o. [name] Primary Care Provider: [name] Other Clinician: Referring Provider: Treating Provider/Extender: [name], [name] in Treatment: 3 Active Problems ICD-10 Encounter Code Description Active Date MDM Diagnosis E11.621 Type 2 diabetes mellitus with foot ulcer 08/20/2020 No Yes L97.528 Non-pressure chronic ulcer of other part of left foot with other specified 08/20/2020 No Yes severity Inactive Problems Resolved Problems Electronic Signature(s) Signed: 09/10/2020 4:45:43 PM By: [name] MD Entered By: [name] on 09/10/2020 10:24:37 -------------------------------------------------------------------------------- Progress Note Details Patient Name: Date of Service: [facility], [place] 8/41/3244 9:00 A M Medical Record Number: [id number] Patient Account Number: [id number] Date of Birth/Sex: Treating RN: 08-Apr-1966 (55 y.o. [name] Primary Care Provider: [name] Other Clinician: Referring Provider: Treating Provider/Extender: [name], [name] in Treatment: 3 Subjective History of Present Illness (HPI) ADMISSION 05/04/2018 This is a 55 year old man who apparently is a type I diabetic confirmed by appropriate serology testing 6 years ago. He is therefore on insulin. His most recent hemoglobin A1c was 10.7 on 03/27/2018. 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 As a result of a combination of this he is insensate and even has reduced sensation in his upper extremities. His current problem started in early July he noted a blister over the left fourth plantar metatarsal head. He saw [name] his podiatrist on 01/07/2018. At that time he had a wound that measured 2 x 1 x 0.4 cm. An x-ray did not show osteomyelitis. It did not appear that he actually followed up. He was admitted to  hospital on 03/25/2018 through 03/30/2018 with his left foot painful and swollen. He underwent a surgical IandD by [name]. I believe his culture at that time showed Enterococcus faecalis and a bone culture showed enterococcus avium. He is been reviewed by [name] of infectious disease although I have not reviewed his note and he has been on 6 weeks worth of ertapenem which should finish sometime apparently on December 9.. MRI suggested possibility of septic arthritis but did not comment specifically on osteomyelitis. A more recent wound culture was negative. The patient has been referred here for consideration among other things of hyperbaric oxygen versus proceeding with a partial ray amputation. This would involve excision of the fourth metatarsal head. The patient is offloading this and a Darco forefoot off loader. I think he is using iodoform packing. 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 Certainly would be a possibility of a total contact cast. I did not see any arterial studies on him however his peripheral pulses are palpable 05/11/2018; patient started his hyperbarics on Friday but did not dive yesterday for personal issues. He saw [name] on 12//19 he notes that during the hospitalization in October 2019 he had an abscess and underwent a bone  biopsy with culture that showed group B strep, enterococcus avium and Enterococcus faecalis. There was Prevotella from the abscess. He was put on ertapenem for 6 weeks which he completed. Per [name] notable for the fact that he is not been treated with ampicillin or amoxicillin. His hemoglobin A1c was noted to be high at 10.7. He is started on ampicillin sulbactam every 6 again I think this is for better coverage for the enterococcus. We started him on silver alginate as there is still purulent looking drainage coming out of the wound and there is indeed exposed bone. The patient has not been unwell 05/18/2018; patient continues with hyperbarics he is tolerating this well and has no complaints. He is on Unasyn every 6. We have been using silver alginate to the area over the fourth plantar metatarsal head. He is a poorly controlled diabetic. There is certainly less purulent drainage than when I first saw this man at the beginning of the month. He only sedimentation rate I see on him was 40 on 03/26/2018. States his blood sugars are under better control in the 120s to 130 range. Follows with his endocrinologist in [facility] I change his primary dressing to Prisma today attempting to increase the granulation. After I left the room he called [place] back into look at a area that he noticed on the right foot 2 days ago. Very superficial and in the medial aspect of his high plantar arch [secondary to his neuropathy] he has a custom insert in the shoe on the right foot. 05/24/2018; continues with HBO doing well. He saw infectious disease today and is still on Unasyn every 6 hours. Not making a lot of progress on the left foot with Prisma I changed him to silver alginate strips. The new area on the right midfoot he is putting silver alginate on this as well. 06/01/2018; patient remains on Unasyn. Not making a lot of progress with granulation. I am going to put him in a total contact cast use Prisma for now.  He has a high co-pay for an advanced treatment option but I suspect feeling in this wound may come to that 06/04/2018; patient back for the obligatory first total contact cast change. We continue to use Prisma 06/08/2018;  the patient is tolerating the cast well although he had some loosening over the tibia which rubbed when he walked. There is no lesion here. We have been using Prisma under the total contact cast. He is in hyperbarics which she is tolerating well 06/15/2018; tolerating total contact cast and hyperbarics well. He continues IV antibiotics and for the underlying osteomyelitis as directed by infectious disease. His wound is making really nice progress 1/21; Patient's wound is down in depth. Using silver collagen. Ab's end this week and Pic line is to be d/ced 1/28; 0.8 cm in depth which is more than last time. We have been using silver collagen under total contact cast. He continues in hyperbarics. His IV antibiotics are finished PICC line has been removed 2/4; surprisingly today the area is actually epithelialized. There is no open area. There is however a divot where the epithelium is gone down to close the wound surface. I am not completely convinced that this is going to remain viable over time but I think time is the best thing to determine this. There is no evidence of surrounding infection. As of today I do not think a repeat MRI or any other procedure is going to be indicative of whether this is going to maintain a closed state. I am going to continue him in a total contact cast. He will complete HBO. Next week I will allow him perhaps to graduate into a Darco forefoot off loader, then a healing sandal. I will then send him back to podiatry to see if they can fashion an offloading shoe if this area remains closed 2/10; the patient was taken out of the cast today the area is healed. He has a divot however this is fully epithelialized and the epithelialized tissue looks healthy. I have  put him into a Darco forefoot off loader. He will complete his last 2 hyperbaric treatments. I will see him next week and I may transition him into a surgical shoe versus his own shoe. He is following up with [name] of podiatry and I think a diabetic shoe with custom inserts could be provided with the signature provided by his endocrinologist ADMISSION 6/22 CLINICAL DATA: Foot ulcer, question of osteomyelitis EXAM: MRI OF THE LEFT FOOT WITHOUT CONTRAST TECHNIQUE: Multiplanar, multisequence MR imaging of the left was performed. No intravenous contrast was administered. COMPARISON: Radiograph same day FINDINGS: Bones/Joint/Cartilage Small cystic changes are seen at the fifth metatarsal head, likely degenerative changes. No marrow edema, osseous fracture, or periosteal reaction are seen throughout the osseous structures. Small joint effusion seen at the fifth MTP joint. There appears to be chronic cortical irregularity with erosive type change seen at the fourth metatarsal head which is unchanged since June 28, 2019, however new since 2019. Ligaments The Lisfranc ligaments and collateral ligaments appear to be intact. Muscles and Tendons Diffusely increased signal seen throughout the muscles surrounding the forefoot with fatty atrophy. The flexor and extensor tendons appear to be intact. The plantar fascia is intact. Soft tissues Along the dorsal lateral aspect of the fifth metatarsal shaft there is a focal area superficial ulceration measuring approximately 8 mm in transverse dimension. There is overlying skin thickening and subcutaneous edema seen. No sinus tract or loculated fluid collection is noted. IMPRESSION: 1. Small focal area of ulceration along the dorsal lateral aspect of the fifth metatarsal shaft. No definite evidence of acute osteomyelitis, sinus tract, or abscess. 2. Progressive chronic erosive cortical destruction seen at the fourth metatarsal head since  2019, however  unchanged since June 28, 2019, which could be from chronic osteomyelitis or prior injury. 3. Small joint effusion at the fifth MTP joint. Electronically Signed By: Prudencio Pair M.D. On: 08/02/2019 52:46 55 year old type I diabetic who we had in clinic in late 2019 to February 2020 with osteomyelitis of his left fourth metatarsal head and a wound in this area. This eventually healed with IV antibiotics and hyperbaric oxygen. He was relatively well until February of this year when he developed I believe 2 wounds on the left fifth met head 1 plantar and 1 more laterally. The one more laterally has since closed over but he has been left with a fairly persistent wound in the plantar left fifth met head. Has been following with [name] of podiatry. For an extended period of time he used Medihoney. He received doxycycline and April at which time a culture showed E. coli and MSSA. An MRI done on August 02, 2019 [shown above] did not show osteomyelitis but did suggest some ongoing destruction at the fourth metatarsal head. He was switched to silver alginate as the primary dressing in May. Earlier this month he received a 2-week course of Bactrim due to erythema around the wound. He was referred to Dr. Alfred Levins of infectious disease who did not think anything was infected and recommended stopping antibiotics. The patient said he felt really well has you finished up the Bactrim but then yesterday morning he became febrile again and noted more erythema around the wound. He was referred here for our review of this. His temperature today is 99.3. He denies any other infectious symptoms no cough no upper respiratory, no dysuria etc. He has been using silver alginate and offloading this area with a surgical shoe. 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  He apparently had shoulder surgery in the fall 2020 I did not look this up he apparently has hardware in this area. Recent x-ray done in Dr. Pasty Arch office on 6/7 was negative for osteo- ABIs have always been noncompressible. Is not previously been thought that arterial insufficiency is contributing to this. 6/29; patient returns the clinic. I thought he might be going to see Dr. Earleen Newport ongoing for this wound but he is back today. He did see Dr. Earleen Newport on 11/22/2019 I believe he agreed with the MRI and apparently was making some arrangements to have this done in [facility] which I am fine with if they can compare with the study of March 2. In any case he returns with a clinic of the wound is not as good. He has been using silver alginate. He is still on another antibiotic that he says Dr. Earleen Newport prescribed 7/8; the patient had his MRI which showed a progressive soft tissue ulceration plantar to the fifth MTP joint with increased soft tissue enhancement throughout the small toe no abscess was noted. There was progressive signal changes in the fifth metatarsal head and fifth proximal phalanx without cortical destruction or definite abnormal T1 signal. These findings are nonspecific could be secondary to hyperemia or early osteomyelitis. He is on amoxicillin as directed by Dr. Earleen Newport. There were plans to do a bone biopsy and culture done in the OR on Friday however the patient canceled this because of insurance issues [surgical center not in [place]. In any case I have told him that this procedure in my opinion needs to be done. He is  using Silvadene cream READMISSION 02/07/2020 This is a patient who had a progressive ulceration over the fifth MTP. We had him here in June and into the early July. He was also followed by [name] of podiatry. The day after we last saw him on 7/9 he went underwent a excision of the fifth MTP to aid in wound healing over this area. Pathology did not show osteomyelitis.  Since then he has been following with Dr. Earleen Newport. Notable that earlier this month he presented with worse wound with purulence. CandS showed MSSA and he was treated with doxycycline and Silvadene cream. More recently he has been changed to Augmentin. He is using Betadine to the wound. He has a forefoot offloading shoe at some point in the postoperative. This wound opened in the surgical line. He is here for our review of this. With regards to his diabetes I spent some time on this today. This was diagnosed in his mid 41s he was on pills for a while and then on insulin. When he was here last time he told me that he thought he had had antipancreatic antibodies that showed he was type I if so I do not see this. He followed for a period of time with Dr. Meredith Pel endocrinology at [facility] who I gather is currently independent practice in [facility]. I have looked through her notes as it applies to what is available in care everywhere and she consistently calls him a type II diabetic. If he had other studies of his diabetes I do not see this. I have therefore changed my label of him to type II ABI in our clinic was 1.41 on the left 9/13; arrives in clinic today with things not looking as good. He has a small linear wound in the incision line but nonviable rolled edges and debris around the circumference. As well it looks as though he had a deep tissue injury over the base of the fifth metatarsal. Raised may be blistered but no open area here. Also concerning erythema around the wound both distally and proximally 9/20; PCR culture I did last time showed high Enterococcus faecalis, medium MRSA and 3 different anaerobic's including medium Peptostreptococcus magnus. I gave him a combination of Augmentin and trimethoprim sulfamethoxazole starting on 9/16 for 10 days. He is tolerating these well He comes in today. His original wound area at the surgical site only has a small crevice we are using endoform here. Not  surprisingly the area over the base of the fifth metatarsal has open with a superficial wound. He has a wrap/surgical shoe injury dorsally on the left foot he is asking about a total contact cast and I think what I know about this area suggests that the lateral part of his foot is a weightbearing surface and I think we could certainly justify this. 9/27; patient is finishing his antibiotics. His surgical wound is actually closed the area over the base of the left fifth metatarsal of is also closed but still with some eschar. 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 As such he actually walks on the outside of his foot. He has been in touch with [facility] Liliane Channel is their orthotic specialist READMISSION 08/20/2020 This is a 55 year old man who is a type II diabetic. 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 He has been in this clinic before with wounds over his left fifth and  left fourth met head. He has a history of osteomyelitis in the left fifth met head requiring an excision of the fifth MTP.. The patient tells [place] that he has an AFO brace now to correct the ankle deformity. Sometime in February he developed a callus over the fourth metatarsal head ultimately Dr. Earleen Newport open this area to expose the wound. He used Silvadene cream for a while most recently Promogran. He has had antibiotics as well including doxycycline and apparently currently Augmentin although I am not certain there is been any cultures. He states the area has not been x-rayed. 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 The met head here is subluxed. His x-ray that we ordered last week was negative for  osteomyelitis. Clinically there does not appear to be infection here. No erythema no drainage the wound does not probe to bone 4/4; put a total contact cast on him last week primary dressing of silver collagen he arrives in clinic today with a wound measuring larger and as opposed to last week a direct probe to bone. He is definitely going to need an MRI 4/11; miraculously the divot the probe to bone his filled in this week. Unfortunately his MRI is not till next Monday. We have been using silver collagen under a total contact cast. He has completed his antibiotics Objective Constitutional Vitals Time Taken: 9:42 AM, Height: 76 in, Weight: 260 lbs, BMI: 31.6, Temperature: 98.7 F, Pulse: 74 bpm, Respiratory Rate: 18 breaths/min, Blood Pressure: 141/87 mmHg, Capillary Blood Glucose: 105 mg/dl. Integumentary (Hair, Skin) Wound #6 status is Open. Original cause of wound was Gradually Appeared. The date acquired was: 07/03/2020. The wound has been in treatment 3 weeks. The wound is located on the Left Metatarsal head fourth. The wound measures 1.4cm length x 1.4cm width x 0.3cm depth; 1.539cm^2 area and 0.462cm^3 volume. There is Fat Layer (Subcutaneous Tissue) exposed. There is no tunneling or undermining noted. There is a medium amount of purulent drainage noted. The wound margin is well defined and not attached to the wound base. There is large (67-100%) pink, pale granulation within the wound bed. There is a small (1-33%) amount of necrotic tissue within the wound bed including Adherent Slough. General Notes: Callused periwound Assessment Active Problems ICD-10 Type 2 diabetes mellitus with foot ulcer Non-pressure chronic ulcer of other part of left foot with other specified severity Procedures Wound #6 Pre-procedure diagnosis of Wound #6 is a Diabetic Wound/Ulcer of the Lower Extremity located on the Left Metatarsal head fourth . There was a T Contact otal Cast Procedure by [name]., MD. Post procedure Diagnosis Wound #6: Same as Pre-Procedure Plan Follow-up Appointments: Return Appointment in 1 week. Bathing/ Shower/ Hygiene: May shower and wash wound with soap and water. - on days that dressing is changed Off-Loading: T Contact Cast to Left Lower Extremity otal WOUND #6: - Metatarsal head fourth Wound Laterality: Left Cleanser: Wound Cleanser 1 x Per Week/ Discharge Instructions: Cleanse the wound with wound cleanser prior to applying a clean dressing using gauze sponges, not tissue or cotton balls. Prim Dressing: Promogran Prisma Matrix, 4.34 (sq in) (silver collagen) (Generic) 1 x Per Week/ ary Discharge Instructions: Moisten collagen with saline or hydrogel Secondary Dressing: Woven Gauze Sponges 2x2 in (Generic) 1 x Per Week/ Discharge  Instructions: Apply over primary dressing as directed. Secondary Dressing: Drawtex 4x4 in 1 x Per Week/ Discharge Instructions: Cut drawtex to fit inside wound Secured With: 51M Medipore H Soft Cloth Surgical T 4 x 2 (in/yd) 1 x Per Week/ ape Discharge Instructions: Secure dressing with tape as directed. 1. I continued with the silver collagen and we will continue with the total contact cast 2. At this point no further antibiotics pending the MRI next week 3. My decision to replace the cast last week seems to be in the correct decision his wound looks a lot better I am going to continue with a total contact cast 4. He will be back to have the cast removed next Monday before he goes for his MRI he can use his scooter to [employer]) Signed: 09/10/2020 4:45:43 PM By: [name] MD Entered By: [name] on 09/10/2020 10:35:06 -------------------------------------------------------------------------------- Total Contact Cast Details Patient Name: Date of Service: [facility] [place]. 3/41/9622 9:00 A M Medical Record Number: [id number] Patient Account Number: [id number] Date of Birth/Sex:  Treating RN: January 06, 1966 (55 y.o. [name] Primary Care Provider: [name] Other Clinician: Referring Provider: Treating Provider/Extender: Elwyn Lade in Treatment: 3 T Contact Cast Applied for Wound Assessment: otal Wound #6 Left Metatarsal head fourth Performed By: Physician [name]., MD Post Procedure Diagnosis Same as Pre-procedure Electronic Signature(s) Signed: 09/10/2020 4:45:43 PM By: [name] MD Entered By: [name] on 09/10/2020 10:24:52 -------------------------------------------------------------------------------- SuperBill Details Patient Name: Date of Service: [facility] [place] 9/41/7408 Medical Record Number: [id number] Patient Account Number: [id number] Date of Birth/Sex: Treating RN: 1965/07/04 (55 y.o. [name] Primary Care Provider: [name] Other Clinician: Referring Provider: Treating Provider/Extender: [name], [name] in Treatment: 3 Diagnosis Coding ICD-10 Codes Code Description [phone] Type 2 diabetes mellitus with foot ulcer L97.528 Non-pressure chronic ulcer of other part of left foot with other specified severity Facility Procedures CPT4 Code: [id number] Description: 29445 - APPLY TOTAL CONTACT LEG CAST ICD-10 Diagnosis Description E11.621 Type 2 diabetes mellitus with foot ulcer L97.528 Non-pressure chronic ulcer of other part of left foot with other specified sever Modifier: ity Quantity: 1 Physician Procedures : CPT4 Code Description Modifier [id number] - WC PHYS LEVEL 4 - NEW PT ICD-10 Diagnosis Description E11.621 Type 2 diabetes mellitus with foot ulcer L97.528 Non-pressure chronic ulcer of other part of left foot with other specified severity Quantity: 1 : [id number] - WC PHYS APPLY TOTAL CONTACT CAST ICD-10 Diagnosis Description E11.621 Type 2 diabetes mellitus with foot ulcer L97.528 Non-pressure chronic ulcer of other part of left  foot with other specified severity Quantity: 1 Electronic Signature(s) Signed: 09/10/2020 4:45:43 PM By: [name] MD Signed: 09/10/2020 5:30:45 PM By: [name] RN, BSN Entered By: [name] on 09/10/2020 13:00:06

## 2020-09-12 NOTE — Progress Notes (Signed)
[name], [name] ([id number]) Visit Report for 09/10/2020 Arrival Information Details Patient Name: Date of Service: [facility], [place] 09/10/2020 9:00 A M Medical Record Number: [id number] Patient Account Number: [id number] Date of Birth/Sex: Treating RN: [name] (55 y.o. [name] Primary Care [name]: [name] Other Clinician: Referring [name]: Treating [name]: 3 Visit Information History Since Last Visit Added or deleted any medications: No Patient Arrived: Knee Scooter Had a fall or experienced change in No Arrival Time: 09:41 activities of daily living that may affect Accompanied By: self risk of falls: Transfer Assistance: None Signs or symptoms of abuse/neglect since last visito No Patient Identification Verified: Yes Hospitalized since last visit: No Secondary Verification Process Completed: Yes Implantable device outside of the clinic excluding No cellular tissue based products placed in the center since last visit: Pain Present Now: No Electronic Signature(s) Signed: 09/10/2020 11:04:14 AM By: [name] Entered By: [name] on 09/10/2020 09:42:33 -------------------------------------------------------------------------------- Encounter Discharge Information Details Patient Name: Date of Service: [facility], Erik Erik B. 09/10/2020 9:00 A M Medical Record Number: [id number] Patient Account Number: [id number] Date of Birth/Sex: Treating RN: [name] (55 y.o. [name] Primary Care Abagayle Klutts: [name] Other Clinician: Referring [name]: Treating [name]: 3 Encounter Discharge Information Items Discharge Condition: Stable Ambulatory Status: Ambulatory Discharge Destination: Home Transportation: Private Auto Accompanied By: self Schedule Follow-up Appointment: Yes Clinical Summary of  Care: Electronic Signature(s) Signed: 09/10/2020 5:44:53 PM By: [name] Entered By: [name] on 09/10/2020 11:04:01 -------------------------------------------------------------------------------- Lower Extremity Assessment Details Patient Name: Date of Service: [facility], [name] 09/10/2020 9:00 A M Medical Record Number: [id number] Patient Account Number: [id number] Date of Birth/Sex: Treating RN: [name] (55 y.o. [name] Primary Care [name]: [name] Other Clinician: Referring [name]: Treating [name] in [name]: 3 Edema Assessment Assessed: [name]: Yes] [name]: No] Edema: [Left: N] [Right: o] Calf Left: Right: Point of Measurement: 33 cm From Medial Instep 36.5 cm Ankle Left: Right: Point of Measurement: 8 cm From Medial Instep 22 cm Vascular Assessment Pulses: Dorsalis Pedis Palpable: [Left:Yes] Electronic Signature(s) Signed: 09/10/2020 5:21:09 PM By: [name] Entered By: [name] on 09/10/2020 10:06:24 -------------------------------------------------------------------------------- Multi Wound Chart Details Patient Name: Date of Service: [facility], Erik Erik B. 09/10/2020 9:00 A M Medical Record Number: [id number] Patient Account Number: [id number] Date of Birth/Sex: Treating RN: [name] (55 y.o. [name] Primary Care [name]: [name] Other Clinician: Referring [name]: Treating [name] in [name]: 3 Vital Signs Height(in): 76 Capillary Blood Glucose(mg/dl): 244 Weight(lbs): 010 Pulse(bpm): 74 Body Mass Index(BMI): 32 Blood Pressure(mmHg): 141/87 Temperature(F): 98.7 Respiratory Rate(breaths/min): 18 Photos: [6:No Photos Left Metatarsal head fourth] [N/A:N/A N/A] Wound Location: [6:Gradually Appeared] [N/A:N/A] Wounding Event: [6:Diabetic Wound/Ulcer of the Lower] [N/A:N/A] Primary  Etiology: [6:Extremity Cataracts, Hypertension, Type II] [N/A:N/A] Comorbid History: [6:Diabetes, Osteomyelitis, Neuropathy 07/03/2020] [N/A:N/A] Date Acquired: [6:3] [N/A:N/A] [name] of [name]: [6:Open] [N/A:N/A] Wound Status: [6:1.4x1.4x0.3] [N/A:N/A] Measurements L x W x D (cm) [6:1.539] [N/A:N/A] A (cm) : rea [6:0.462] [N/A:N/A] Volume (cm) : [6:-8.80%] [N/A:N/A] % Reduction in A rea: [6:18.20%] [N/A:N/A] % Reduction in Volume: [6:Grade 1] [N/A:N/A] Classification: [6:Medium] [N/A:N/A] Exudate A mount: [6:Purulent] [N/A:N/A] Exudate Type: [6:yellow, brown, green] [N/A:N/A] Exudate Color: [6:Well defined, not attached] [N/A:N/A] Wound Margin: [6:Large (67-100%)] [N/A:N/A] Granulation A mount: [6:Pink, Pale] [N/A:N/A] Granulation Quality: [6:Small (1-33%)] [N/A:N/A] Necrotic A mount: [6:Fat Layer (Subcutaneous Tissue): Yes N/A] Exposed  Structures: [6:Fascia: No Tendon: No Muscle: No Joint: No Bone: No Small (1-33%)] [N/A:N/A] Epithelialization: [6:Callused periwound] [N/A:N/A] Assessment Notes: [6:T Contact Cast otal] [N/A:N/A] [name] Notes Electronic Signature(s) Signed: 09/10/2020 4:45:43 PM By: Erik Najjar MD Signed: 09/10/2020 5:30:45 PM By: Erik Abts RN, BSN Entered By: [name] on 09/10/2020 10:24:44 -------------------------------------------------------------------------------- Multi-Disciplinary Care Plan Details Patient Name: Date of Service: [facility], Erik Erik B. 09/10/2020 9:00 A M Medical Record Number: [id number] Patient Account Number: [id number] Date of Birth/Sex: Treating RN: [name] (55 y.o. [name] Primary Care [name]: [name] Other Clinician: Referring [name]: Treating [name]: 3 Multidisciplinary Care Plan reviewed with physician Active Inactive Nutrition Nursing Diagnoses: Impaired glucose control: actual or potential Potential for alteratiion  in Nutrition/Potential for imbalanced nutrition Goals: Patient/caregiver agrees to and verbalizes understanding of need to use nutritional supplements and/or vitamins as prescribed Date Initiated: 08/20/2020 Target Resolution Date: 09/21/2020 Goal Status: Active Patient/caregiver will maintain therapeutic glucose control Date Initiated: 08/20/2020 Target Resolution Date: 09/21/2020 Goal Status: Active Interventions: Assess HgA1c results as ordered upon admission and as needed Assess patient nutrition upon admission and as needed per policy Provide education on elevated blood sugars and impact on wound healing Provide education on nutrition [name] Activities: Education provided on Nutrition : 08/20/2020 Notes: Wound/Skin Impairment Nursing Diagnoses: Impaired tissue integrity Knowledge deficit related to ulceration/compromised skin integrity Goals: Patient/caregiver will verbalize understanding of skin care regimen Date Initiated: 08/20/2020 Target Resolution Date: 09/21/2020 Goal Status: Active Ulcer/skin breakdown will have a volume reduction of 30% by week 4 Date Initiated: 08/20/2020 Target Resolution Date: 09/21/2020 Goal Status: Active Interventions: Assess patient/caregiver ability to obtain necessary supplies Assess patient/caregiver ability to perform ulcer/skin care regimen upon admission and as needed Assess ulceration(s) every visit Provide education on ulcer and skin care Notes: Electronic Signature(s) Signed: 09/10/2020 5:30:45 PM By: Erik Abts RN, BSN Entered By: [name] on 09/10/2020 09:54:43 -------------------------------------------------------------------------------- Pain Assessment Details Patient Name: Date of Service: [facility], Erik Erik B. 09/10/2020 9:00 A M Medical Record Number: [id number] Patient Account Number: [id number] Date of Birth/Sex: Treating RN: [name][birth year] (55 y.o. [name] Primary Care Champayne Kocian: [name] Other  Clinician: Referring Erdine Hulen: Treating Christyanna Mckeon/Extender: [name], Alehegn [name] in [name]: 3 Active Problems Location of Pain Severity and Description of Pain Patient Has Paino No Site Locations Pain Management and Medication Current Pain Management: Electronic Signature(s) Signed: 09/10/2020 11:04:14 AM By: [name] Signed: 09/10/2020 5:30:45 PM By: Erik Abts RN, BSN Entered By: [name] on 09/10/2020 09:43:01 -------------------------------------------------------------------------------- Patient/Caregiver Education Details Patient Name: Date of Service: [facility], Erik Jilda Panda 4/11/2022andnbsp9:00 A M Medical Record Number: [id number] Patient Account Number: [id number] Date of Birth/Gender: Treating RN: [birth year] [name] (55 y.o. [name] Primary Care Physician: [name] Other Clinician: Referring Physician: Treating Physician/Extender: Alta Corning in [name]: 3 Education Assessment Education Provided To: Patient Education Topics Provided Wound/Skin Impairment: Methods: Explain/Verbal Responses: State content correctly Electronic Signature(s) Signed: 09/10/2020 5:30:45 PM By: Erik Abts RN, BSN Entered By: [name] on 09/10/2020 09:54:54 -------------------------------------------------------------------------------- Wound Assessment Details Patient Name: Date of Service: [facility], Erik Erik B. 09/10/2020 9:00 A M Medical Record Number: [id number] Patient Account Number: [id number] Date of Birth/Sex: Treating RN: [name] (55 y.o. [name] Primary Care Denys Salinger: [name] Other Clinician: Referring Ina Scrivens: Treating Deejay Koppelman/Extender: [name], Alehegn [name] in [name]: 3 Wound Status Wound Number: 6 Primary Diabetic Wound/Ulcer of the Lower Extremity Etiology: Wound Location: Left Metatarsal head fourth  Wound Status: Open Wounding Event: Gradually  Appeared Comorbid Cataracts, Hypertension, Type II Diabetes, Osteomyelitis, Date Acquired: 07/03/2020 History: Neuropathy [name] Of [name]: 3 Clustered Wound: No Photos Wound Measurements Length: (cm) 1.4 Width: (cm) 1.4 Depth: (cm) 0.3 Area: (cm) 1.539 Volume: (cm) 0.462 % Reduction in Area: -8.8% % Reduction in Volume: 18.2% Epithelialization: Small (1-33%) Tunneling: No Undermining: No Wound Description Classification: Grade 1 Wound Margin: Well defined, not attached Exudate Amount: Medium Exudate Type: Purulent Exudate Color: yellow, brown, green Foul Odor After Cleansing: No Slough/Fibrino Yes Wound Bed Granulation Amount: Large (67-100%) Exposed Structure Granulation Quality: Pink, Pale Fascia Exposed: No Necrotic Amount: Small (1-33%) Fat Layer (Subcutaneous Tissue) Exposed: Yes Necrotic Quality: Adherent Slough Tendon Exposed: No Muscle Exposed: No Joint Exposed: No Bone Exposed: No Assessment Notes Callused periwound [name] Notes Wound #6 (Metatarsal head fourth) Wound Laterality: Left Cleanser Wound Cleanser Discharge Instruction: Cleanse the wound with wound cleanser prior to applying a clean dressing using gauze sponges, not tissue or cotton balls. Peri-Wound Care Topical Primary Dressing Promogran Prisma Matrix, 4.34 (sq in) (silver collagen) Discharge Instruction: Moisten collagen with saline or hydrogel Secondary Dressing Woven Gauze Sponges 2x2 in Discharge Instruction: Apply over primary dressing as directed. Drawtex 4x4 in Discharge Instruction: Cut drawtex to fit inside wound Secured With 20M Medipore H Soft Cloth Surgical T 4 x 2 (in/yd) ape Discharge Instruction: Secure dressing with tape as directed. Compression Wrap Compression Stockings Add-Ons Notes TCC last layer applied by MD. Electronic Signature(s) Signed: 09/10/2020 5:30:45 PM By: Erik Abts RN, BSN Signed: 09/12/2020 8:29:36 AM By: [name] Entered By:  [name] on 09/10/2020 16:34:59 -------------------------------------------------------------------------------- Vitals Details Patient Name: Date of Service: [facility], Erik Erik B. 09/10/2020 9:00 A M Medical Record Number: [id number] Patient Account Number: [id number] Date of Birth/Sex: Treating RN: [name] (55 y.o. [name] Primary Care Kaylon Hitz: [name] Other Clinician: Referring Linkyn Gobin: Treating Ellery Tash/Extender: [name], Alehegn [name] in [name]: 3 Vital Signs Time Taken: 09:42 Temperature (F): 98.7 Height (in): 76 Pulse (bpm): 74 Weight (lbs): 260 Respiratory Rate (breaths/min): 18 Body Mass Index (BMI): 31.6 Blood Pressure (mmHg): 141/87 Capillary Blood Glucose (mg/dl): 762 Reference Range: 80 - 120 mg / dl Electronic Signature(s) Signed: 09/10/2020 11:04:14 AM By: [name] Entered By: [name] on 09/10/2020 09:42:55

## 2020-09-17 ENCOUNTER — Encounter (HOSPITAL_BASED_OUTPATIENT_CLINIC_OR_DEPARTMENT_OTHER): Payer: BLUE CROSS/BLUE SHIELD | Admitting: Internal Medicine

## 2020-09-17 ENCOUNTER — Other Ambulatory Visit: Payer: Self-pay

## 2020-09-17 ENCOUNTER — Ambulatory Visit (HOSPITAL_COMMUNITY): Payer: BLUE CROSS/BLUE SHIELD

## 2020-09-17 ENCOUNTER — Encounter (HOSPITAL_COMMUNITY): Payer: Self-pay

## 2020-09-17 DIAGNOSIS — E10621 Type 1 diabetes mellitus with foot ulcer: Secondary | ICD-10-CM | POA: Diagnosis not present

## 2020-09-17 NOTE — Progress Notes (Signed)
[name], [name] ([id number]) Visit Report for 09/17/2020 HPI Details Patient Name: Date of Service: [facility], [place] 1/60/1093 11:00 A M Medical Record Number: [id number] Patient Account Number: [id number] Date of Birth/Sex: Treating RN: [birth year] (55 y.o. [name] Primary Care Provider: [name] Other Clinician: Referring Provider: Treating Provider/Extender: [name], [name] in Treatment: 4 History of Present Illness HPI Description: ADMISSION 05/04/2018 This is a 55 year old man who apparently is a type I diabetic confirmed by appropriate serology testing 6 years ago. He is therefore on insulin. His most recent hemoglobin A1c was 10.7 on 03/27/2018. 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 As a result of a combination of this he is insensate and even has reduced sensation in his upper extremities. His current problem started in early July he noted a blister over the left fourth plantar metatarsal head. He saw [name] his podiatrist on 01/07/2018. At that time he had a wound that measured 2 x 1 x 0.4 cm. An x-ray did not show osteomyelitis. It did not appear that he actually followed up. He was admitted to hospital on 03/25/2018 through 03/30/2018 with his left foot painful and swollen. He underwent a surgical IandD by [name]. I believe his culture at that time showed Enterococcus faecalis and a bone culture showed enterococcus avium. He is been reviewed by [name] of infectious disease although I have not reviewed his note and he has been on 6 weeks worth of ertapenem which should finish sometime apparently on December 9.. MRI suggested possibility of septic arthritis but did not comment specifically on osteomyelitis. A more recent wound culture was negative. The patient has been  referred here for consideration among other things of hyperbaric oxygen versus proceeding with a partial ray amputation. This would involve excision of the fourth metatarsal head. The patient is offloading this and a Darco forefoot off loader. I think he is using iodoform packing. 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 Certainly would be a possibility of a total contact cast. I did not see any arterial studies on him however his peripheral pulses are palpable 05/11/2018; patient started his hyperbarics on Friday but did not dive yesterday for personal issues. He saw [name] on 12//19 he notes that during the hospitalization in October 2019 he had an abscess and underwent a bone biopsy with culture that showed group B strep, enterococcus avium and Enterococcus faecalis. There was Prevotella from the abscess. He was put on ertapenem for 6 weeks which he completed. Per [name] notable for the fact that he is not been treated with ampicillin or amoxicillin. His hemoglobin A1c was noted to be high at 10.7. He is started on ampicillin sulbactam every 6 again I think this is for better coverage for the enterococcus. We started him on silver alginate as there is still purulent looking drainage coming out of the wound and there is indeed exposed bone. The patient has not been unwell 05/18/2018; patient continues with hyperbarics he is tolerating this well and has no complaints. He is on Unasyn every 6. We have been using silver alginate to the area over the fourth plantar metatarsal head. He is a poorly controlled diabetic. There is certainly less purulent drainage than when I first saw this man at the beginning of the month. He only sedimentation rate I see on  him was 40 on 03/26/2018. States his blood sugars are under better control in the 120s to 130 range. Follows with his endocrinologist in [facility] I change his primary dressing to Prisma today attempting to increase the  granulation. After I left the room he called [place] back into look at a area that he noticed on the right foot 2 days ago. Very superficial and in the medial aspect of his high plantar arch [secondary to his neuropathy] he has a custom insert in the shoe on the right foot. 05/24/2018; continues with HBO doing well. He saw infectious disease today and is still on Unasyn every 6 hours. Not making a lot of progress on the left foot with Prisma I changed him to silver alginate strips. The new area on the right midfoot he is putting silver alginate on this as well. 06/01/2018; patient remains on Unasyn. Not making a lot of progress with granulation. I am going to put him in a total contact cast use Prisma for now. He has a high co-pay for an advanced treatment option but I suspect feeling in this wound may come to that 06/04/2018; patient back for the obligatory first total contact cast change. We continue to use Prisma 06/08/2018; the patient is tolerating the cast well although he had some loosening over the tibia which rubbed when he walked. There is no lesion here. We have been using Prisma under the total contact cast. He is in hyperbarics which she is tolerating well 06/15/2018; tolerating total contact cast and hyperbarics well. He continues IV antibiotics and for the underlying osteomyelitis as directed by infectious disease. His wound is making really nice progress 1/21; Patient's wound is down in depth. Using silver collagen. Ab's end this week and Pic line is to be d/ced 1/28; 0.8 cm in depth which is more than last time. We have been using silver collagen under total contact cast. He continues in hyperbarics. His IV antibiotics are finished PICC line has been removed 2/4; surprisingly today the area is actually epithelialized. There is no open area. There is however a divot where the epithelium is gone down to close the wound surface. I am not completely convinced that this is going to remain viable  over time but I think time is the best thing to determine this. There is no evidence of surrounding infection. As of today I do not think a repeat MRI or any other procedure is going to be indicative of whether this is going to maintain a closed state. I am going to continue him in a total contact cast. He will complete HBO. Next week I will allow him perhaps to graduate into a Darco forefoot off loader, then a healing sandal. I will then send him back to podiatry to see if they can fashion an offloading shoe if this area remains closed 2/10; the patient was taken out of the cast today the area is healed. He has a divot however this is fully epithelialized and the epithelialized tissue looks healthy. I have put him into a Darco forefoot off loader. He will complete his last 2 hyperbaric treatments. I will see him next week and I may transition him into a surgical shoe versus his own shoe. He is following up with [name] of podiatry and I think a diabetic shoe with custom inserts could be provided with the signature provided by his endocrinologist ADMISSION 6/22 CLINICAL DATA: Foot ulcer, question of osteomyelitis EXAM: MRI OF THE LEFT FOOT WITHOUT CONTRAST TECHNIQUE:  Multiplanar, multisequence MR imaging of the left was performed. No intravenous contrast was administered. COMPARISON: Radiograph same day FINDINGS: Bones/Joint/Cartilage Small cystic changes are seen at the fifth metatarsal head, likely degenerative changes. No marrow edema, osseous fracture, or periosteal reaction are seen throughout the osseous structures. Small joint effusion seen at the fifth MTP joint. There appears to be chronic cortical irregularity with erosive type change seen at the fourth metatarsal head which is unchanged since June 28, 2019, however new since 2019. Ligaments The Lisfranc ligaments and collateral ligaments appear to be intact. Muscles and Tendons Diffusely increased signal seen throughout  the muscles surrounding the forefoot with fatty atrophy. The flexor and extensor tendons appear to be intact. The plantar fascia is intact. Soft tissues Along the dorsal lateral aspect of the fifth metatarsal shaft there is a focal area superficial ulceration measuring approximately 8 mm in transverse dimension. There is overlying skin thickening and subcutaneous edema seen. No sinus tract or loculated fluid collection is noted. IMPRESSION: 1. Small focal area of ulceration along the dorsal lateral aspect of the fifth metatarsal shaft. No definite evidence of acute osteomyelitis, sinus tract, or abscess. 2. Progressive chronic erosive cortical destruction seen at the fourth metatarsal head since 2019, however unchanged since June 28, 2019, which could be from chronic osteomyelitis or prior injury. 3. Small joint effusion at the fifth MTP joint. Electronically Signed By: Prudencio Pair M.D. On: 08/02/2019 1:19 55 year old type I diabetic who we had in clinic in late 2019 to February 2020 with osteomyelitis of his left fourth metatarsal head and a wound in this area. This eventually healed with IV antibiotics and hyperbaric oxygen. He was relatively well until February of this year when he developed I believe 2 wounds on the left fifth met head 1 plantar and 1 more laterally. The one more laterally has since closed over but he has been left with a fairly persistent wound in the plantar left fifth met head. Has been following with [name] of podiatry. For an extended period of time he used Medihoney. He received doxycycline and April at which time a culture showed E. coli and MSSA. An MRI done on August 02, 2019 [shown above] did not show osteomyelitis but did suggest some ongoing destruction at the fourth metatarsal head. He was switched to silver alginate as the primary dressing in May. Earlier this month he received a 2-week course of Bactrim due to erythema around the wound. He was  referred to Dr. Alfred Levins of infectious disease who did not think anything was infected and recommended stopping antibiotics. The patient said he felt really well has you finished up the Bactrim but then yesterday morning he became febrile again and noted more erythema around the wound. He was referred here for our review of this. His temperature today is 99.3. He denies any other infectious symptoms no cough no upper respiratory, no dysuria etc. He has been using silver alginate and offloading this area with a surgical shoe. 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 He apparently had shoulder surgery in the fall 2020 I did not look this up he apparently has hardware in this area. Recent x-ray done in Dr. Pasty Arch office on 6/7 was negative for osteo- ABIs have always been noncompressible. Is not previously been thought that arterial insufficiency is contributing to this. 6/29; patient returns the clinic. I thought he might be going  to see Dr. Earleen Newport ongoing for this wound but he is back today. He did see Dr. Earleen Newport on 11/22/2019 I believe he agreed with the MRI and apparently was making some arrangements to have this done in [facility] which I am fine with if they can compare with the study of March 2. In any case he returns with a clinic of the wound is not as good. He has been using silver alginate. He is still on another antibiotic that he says Dr. Earleen Newport prescribed 7/8; the patient had his MRI which showed a progressive soft tissue ulceration plantar to the fifth MTP joint with increased soft tissue enhancement throughout the small toe no abscess was noted. There was progressive signal changes in the fifth metatarsal head and fifth proximal phalanx without cortical destruction or definite abnormal T1 signal. These findings are nonspecific could be secondary  to hyperemia or early osteomyelitis. He is on amoxicillin as directed by Dr. Earleen Newport. There were plans to do a bone biopsy and culture done in the OR on Friday however the patient canceled this because of insurance issues [surgical center not in [place]. In any case I have told him that this procedure in my opinion needs to be done. He is using Silvadene cream READMISSION 02/07/2020 This is a patient who had a progressive ulceration over the fifth MTP. We had him here in June and into the early July. He was also followed by [name] of podiatry. The day after we last saw him on 7/9 he went underwent a excision of the fifth MTP to aid in wound healing over this area. Pathology did not show osteomyelitis. Since then he has been following with Dr. Earleen Newport. Notable that earlier this month he presented with worse wound with purulence. CandS showed MSSA and he was treated with doxycycline and Silvadene cream. More recently he has been changed to Augmentin. He is using Betadine to the wound. He has a forefoot offloading shoe at some point in the postoperative. This wound opened in the surgical line. He is here for our review of this. With regards to his diabetes I spent some time on this today. This was diagnosed in his mid 100s he was on pills for a while and then on insulin. When he was here last time he told me that he thought he had had antipancreatic antibodies that showed he was type I if so I do not see this. He followed for a period of time with Dr. Meredith Pel endocrinology at [facility] who I gather is currently independent practice in [facility]. I have looked through her notes as it applies to what is available in care everywhere and she consistently calls him a type II diabetic. If he had other studies of his diabetes I do not see this. I have therefore changed my label of him to type II ABI in our clinic was 1.41 on the left 9/13; arrives in clinic today with things not looking as good. He has a  small linear wound in the incision line but nonviable rolled edges and debris around the circumference. As well it looks as though he had a deep tissue injury over the base of the fifth metatarsal. Raised may be blistered but no open area here. Also concerning erythema around the wound both distally and proximally 9/20; PCR culture I did last time showed high Enterococcus faecalis, medium MRSA and 3 different anaerobic's including medium Peptostreptococcus magnus. I gave him a combination of Augmentin and trimethoprim sulfamethoxazole starting  on 9/16 for 10 days. He is tolerating these well He comes in today. His original wound area at the surgical site only has a small crevice we are using endoform here. Not surprisingly the area over the base of the fifth metatarsal has open with a superficial wound. He has a wrap/surgical shoe injury dorsally on the left foot he is asking about a total contact cast and I think what I know about this area suggests that the lateral part of his foot is a weightbearing surface and I think we could certainly justify this. 9/27; patient is finishing his antibiotics. His surgical wound is actually closed the area over the base of the left fifth metatarsal of is also closed but still with some eschar. 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 As such he actually walks on the outside of his foot. He has been in touch with [facility] Liliane Channel is their orthotic specialist READMISSION 08/20/2020 This is a 55 year old man who is a type II diabetic. 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 He has been in this clinic before with wounds over his left fifth and left fourth met head. He has a history of osteomyelitis in the left fifth met head requiring an excision of the fifth MTP.. The patient tells [place] that he has an AFO brace now to correct the ankle  deformity. Sometime in February he developed a callus over the fourth metatarsal head ultimately Dr. Earleen Newport open this area to expose the wound. He used Silvadene cream for a while most recently Promogran. He has had antibiotics as well including doxycycline and apparently currently Augmentin although I am not certain there is been any cultures. He states the area has not been x-rayed. 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 The met head here is subluxed. His x-ray that we ordered last week was negative for osteomyelitis. Clinically there does not appear to be infection here. No erythema no drainage the wound does not probe to bone 4/4; put a total contact cast on him last week primary dressing of silver collagen he arrives in clinic today with a wound measuring larger and as opposed to last week a direct probe to bone. He is definitely going to need an MRI 4/11; miraculously the divot the probe to bone his filled in this week. Unfortunately his MRI is not till next Monday. We have been using silver collagen under a total contact cast. He has completed his antibiotics 4/18; no palpable bone. The distal half of this wound is has depth but there is no exposed bone. More proximal is at the level of the surrounding skin. His MRI is being delayed because of insurance issues was supposed to be today then Wednesday and now we are not exactly sure what the date is or what the issue is. In general things look somewhat better. We have been using silver collagen under the total contact cast Electronic Signature(s) Signed: 09/17/2020 5:05:50 PM By: [name] MD Entered By: [name] on 09/17/2020 12:04:33 -------------------------------------------------------------------------------- Physical Exam  Details Patient Name: Date of Service: [facility] [place] 08/26/7122 11:00 A M Medical Record Number: [id number] Patient Account Number: [id number] Date of  Birth/Sex: Treating RN: [birth year] (55 y.o. [name] Primary Care Provider: [name] Other Clinician: Referring Provider: Treating Provider/Extender: [name], [name] in Treatment: 4 Constitutional Patient is hypertensive.. Pulse regular and within target range for patient.Marland Kitchen Respirations regular, non-labored and within target range.. Temperature is normal and within the target range for the patient.Marland Kitchen Appears in no distress. Notes Wound exam; left fourth metatarsal head. The small divot does not not probe to bone. There was some slough at the base of this that washes out. There is no exposed bone. There is no surrounding infection Electronic Signature(s) Signed: 09/17/2020 5:05:50 PM By: [name] MD Entered By: [name] on 09/17/2020 12:05:57 -------------------------------------------------------------------------------- Physician Orders Details Patient Name: Date of Service: [facility], [place] 10/05/3974 11:00 A M Medical Record Number: [id number] Patient Account Number: [id number] Date of Birth/Sex: Treating RN: 04/01/1966 (55 y.o. [name] Primary Care Provider: [name] Other Clinician: Referring Provider: Treating Provider/Extender: Elwyn Lade in Treatment: 4 Verbal / Phone Orders: No Diagnosis Coding ICD-10 Coding Code Description E11.621 Type 2 diabetes mellitus with foot ulcer L97.528 Non-pressure chronic ulcer of other part of left foot with other specified severity Follow-up Appointments Return Appointment in 1 week. Bathing/ Shower/ Hygiene May shower and wash wound with soap and water. - on days that dressing is changed Off-Loading Total Contact Cast to Left Lower Extremity Wound Treatment Wound #6 - Metatarsal  head fourth Wound Laterality: Left Cleanser: Wound Cleanser 1 x Per Week Discharge Instructions: Cleanse the wound with wound cleanser prior to applying a clean dressing using gauze sponges, not tissue or cotton balls. Prim Dressing: FIBRACOL Plus Dressing, 2x2 in (collagen) 1 x Per Week ary Discharge Instructions: Moisten collagen with saline or hydrogel Secondary Dressing: Woven Gauze Sponges 2x2 in (Generic) 1 x Per Week Discharge Instructions: Apply over primary dressing as directed. Secondary Dressing: Drawtex 4x4 in 1 x Per Week Discharge Instructions: Cut drawtex to fit inside wound Secured With: 75M Medipore H Soft Cloth Surgical T 4 x 2 (in/yd) 1 x Per Week ape Discharge Instructions: Secure dressing with tape as directed. Electronic Signature(s) Signed: 09/17/2020 5:05:50 PM By: [name] MD Signed: 09/17/2020 5:39:31 PM By: [name] RN, BSN Entered By: [name] on 09/17/2020 11:29:14 -------------------------------------------------------------------------------- Problem List Details Patient Name: Date of Service: [facility], [place] 2/40/9735 11:00 A M Medical Record Number: [id number] Patient Account Number: [id number] Date of Birth/Sex: Treating RN: [birth year] (55 y.o. [name] Primary Care Provider: [name] Other Clinician: Referring Provider: Treating Provider/Extender: [name], [name] in Treatment: 4 Active Problems ICD-10 Encounter Code Description Active Date MDM Diagnosis E11.621 Type 2 diabetes mellitus with foot ulcer 08/20/2020 No Yes L97.528 Non-pressure chronic ulcer of other part of left foot with other specified 08/20/2020 No Yes severity Inactive Problems Resolved Problems Electronic Signature(s) Signed: 09/17/2020 5:05:50 PM By: [name] MD Entered By: [name] on 09/17/2020 12:01:56 -------------------------------------------------------------------------------- Progress Note  Details Patient Name: Date of Service: [facility], [place] 3/41/9622 11:00 A M Medical Record Number: [id number] Patient Account Number: [id number] Date of Birth/Sex: Treating RN: January 06, 1966 (56 y.o. [name] Primary Care Provider: [name] Other Clinician: Referring Provider: Treating Provider/Extender: [name], [name] in Treatment: 4 Subjective History of Present Illness (HPI) ADMISSION 05/04/2018 This is a 55 year old man who apparently is a type I diabetic confirmed by appropriate serology testing 6 years ago. He is therefore on insulin. His most recent hemoglobin  A1c was 10.7 on 03/27/2018. 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 As a result of a combination of this he is insensate and even has reduced sensation in his upper extremities. His current problem started in early July he noted a blister over the left fourth plantar metatarsal head. He saw [name] his podiatrist on 01/07/2018. At that time he had a wound that measured 2 x 1 x 0.4 cm. An x-ray did not show osteomyelitis. It did not appear that he actually followed up. He was admitted to hospital on 03/25/2018 through 03/30/2018 with his left foot painful and swollen. He underwent a surgical IandD by [name]. I believe his culture at that time showed Enterococcus faecalis and a bone culture showed enterococcus avium. He is been reviewed by [name] of infectious disease although I have not reviewed his note and he has been on 6 weeks worth of ertapenem which should finish sometime apparently on December 9.. MRI suggested possibility of septic arthritis but did not comment specifically on osteomyelitis. A more recent wound culture was negative. The patient has been referred here for consideration among other things of hyperbaric  oxygen versus proceeding with a partial ray amputation. This would involve excision of the fourth metatarsal head. The patient is offloading this and a Darco forefoot off loader. I think he is using iodoform packing. 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 Certainly would be a possibility of a total contact cast. I did not see any arterial studies on him however his peripheral pulses are palpable 05/11/2018; patient started his hyperbarics on Friday but did not dive yesterday for personal issues. He saw [name] on 12//19 he notes that during the hospitalization in October 2019 he had an abscess and underwent a bone biopsy with culture that showed group B strep, enterococcus avium and Enterococcus faecalis. There was Prevotella from the abscess. He was put on ertapenem for 6 weeks which he completed. Per [name] notable for the fact that he is not been treated with ampicillin or amoxicillin. His hemoglobin A1c was noted to be high at 10.7. He is started on ampicillin sulbactam every 6 again I think this is for better coverage for the enterococcus. We started him on silver alginate as there is still purulent looking drainage coming out of the wound and there is indeed exposed bone. The patient has not been unwell 05/18/2018; patient continues with hyperbarics he is tolerating this well and has no complaints. He is on Unasyn every 6. We have been using silver alginate to the area over the fourth plantar metatarsal head. He is a poorly controlled diabetic. There is certainly less purulent drainage than when I first saw this man at the beginning of the month. He only sedimentation rate I see on him was 40 on 03/26/2018. States his blood sugars are under better control in the 120s to 130 range. Follows with his endocrinologist in [facility] I change his primary dressing to Prisma today attempting to increase the granulation. After I left the room he called [place] back into look at  a area that he noticed on the right foot 2 days ago. Very superficial and in the medial aspect of his high plantar arch [secondary to his neuropathy] he has a custom insert in the shoe on the right foot. 05/24/2018; continues with HBO doing well. He  saw infectious disease today and is still on Unasyn every 6 hours. Not making a lot of progress on the left foot with Prisma I changed him to silver alginate strips. The new area on the right midfoot he is putting silver alginate on this as well. 06/01/2018; patient remains on Unasyn. Not making a lot of progress with granulation. I am going to put him in a total contact cast use Prisma for now. He has a high co-pay for an advanced treatment option but I suspect feeling in this wound may come to that 06/04/2018; patient back for the obligatory first total contact cast change. We continue to use Prisma 06/08/2018; the patient is tolerating the cast well although he had some loosening over the tibia which rubbed when he walked. There is no lesion here. We have been using Prisma under the total contact cast. He is in hyperbarics which she is tolerating well 06/15/2018; tolerating total contact cast and hyperbarics well. He continues IV antibiotics and for the underlying osteomyelitis as directed by infectious disease. His wound is making really nice progress 1/21; Patient's wound is down in depth. Using silver collagen. Ab's end this week and Pic line is to be d/ced 1/28; 0.8 cm in depth which is more than last time. We have been using silver collagen under total contact cast. He continues in hyperbarics. His IV antibiotics are finished PICC line has been removed 2/4; surprisingly today the area is actually epithelialized. There is no open area. There is however a divot where the epithelium is gone down to close the wound surface. I am not completely convinced that this is going to remain viable over time but I think time is the best thing to determine this.  There is no evidence of surrounding infection. As of today I do not think a repeat MRI or any other procedure is going to be indicative of whether this is going to maintain a closed state. I am going to continue him in a total contact cast. He will complete HBO. Next week I will allow him perhaps to graduate into a Darco forefoot off loader, then a healing sandal. I will then send him back to podiatry to see if they can fashion an offloading shoe if this area remains closed 2/10; the patient was taken out of the cast today the area is healed. He has a divot however this is fully epithelialized and the epithelialized tissue looks healthy. I have put him into a Darco forefoot off loader. He will complete his last 2 hyperbaric treatments. I will see him next week and I may transition him into a surgical shoe versus his own shoe. He is following up with [name] of podiatry and I think a diabetic shoe with custom inserts could be provided with the signature provided by his endocrinologist ADMISSION 6/22 CLINICAL DATA: Foot ulcer, question of osteomyelitis EXAM: MRI OF THE LEFT FOOT WITHOUT CONTRAST TECHNIQUE: Multiplanar, multisequence MR imaging of the left was performed. No intravenous contrast was administered. COMPARISON: Radiograph same day FINDINGS: Bones/Joint/Cartilage Small cystic changes are seen at the fifth metatarsal head, likely degenerative changes. No marrow edema, osseous fracture, or periosteal reaction are seen throughout the osseous structures. Small joint effusion seen at the fifth MTP joint. There appears to be chronic cortical irregularity with erosive type change seen at the fourth metatarsal head which is unchanged since June 28, 2019, however new since 2019. Ligaments The Lisfranc ligaments and collateral ligaments appear to be intact. Muscles and Tendons Diffusely  increased signal seen throughout the muscles surrounding the forefoot with fatty atrophy. The  flexor and extensor tendons appear to be intact. The plantar fascia is intact. Soft tissues Along the dorsal lateral aspect of the fifth metatarsal shaft there is a focal area superficial ulceration measuring approximately 8 mm in transverse dimension. There is overlying skin thickening and subcutaneous edema seen. No sinus tract or loculated fluid collection is noted. IMPRESSION: 1. Small focal area of ulceration along the dorsal lateral aspect of the fifth metatarsal shaft. No definite evidence of acute osteomyelitis, sinus tract, or abscess. 2. Progressive chronic erosive cortical destruction seen at the fourth metatarsal head since 2019, however unchanged since June 28, 2019, which could be from chronic osteomyelitis or prior injury. 3. Small joint effusion at the fifth MTP joint. Electronically Signed By: Prudencio Pair M.D. On: 08/02/2019 82:72 55 year old type I diabetic who we had in clinic in late 2019 to February 2020 with osteomyelitis of his left fourth metatarsal head and a wound in this area. This eventually healed with IV antibiotics and hyperbaric oxygen. He was relatively well until February of this year when he developed I believe 2 wounds on the left fifth met head 1 plantar and 1 more laterally. The one more laterally has since closed over but he has been left with a fairly persistent wound in the plantar left fifth met head. Has been following with [name] of podiatry. For an extended period of time he used Medihoney. He received doxycycline and April at which time a culture showed E. coli and MSSA. An MRI done on August 02, 2019 [shown above] did not show osteomyelitis but did suggest some ongoing destruction at the fourth metatarsal head. He was switched to silver alginate as the primary dressing in May. Earlier this month he received a 2-week course of Bactrim due to erythema around the wound. He was referred to Dr. Alfred Levins of infectious disease who did not think  anything was infected and recommended stopping antibiotics. The patient said he felt really well has you finished up the Bactrim but then yesterday morning he became febrile again and noted more erythema around the wound. He was referred here for our review of this. His temperature today is 99.3. He denies any other infectious symptoms no cough no upper respiratory, no dysuria etc. He has been using silver alginate and offloading this area with a surgical shoe. 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 He apparently had shoulder surgery in the fall 2020 I did not look this up he apparently has hardware in this area. Recent x-ray done in Dr. Pasty Arch office on 6/7 was negative for osteo- ABIs have always been noncompressible. Is not previously been thought that arterial insufficiency is contributing to this. 6/29; patient returns the clinic. I thought he might be going to see Dr. Earleen Newport ongoing for this wound but he is back today. He did see Dr. Earleen Newport on 11/22/2019 I believe he agreed with the MRI and apparently was making some arrangements to have this done in [facility] which I am fine with if they can compare with the study of March 2. In any case he returns with a clinic of the wound is not as good. He has been using silver alginate. He is still on another antibiotic that he says Dr. Earleen Newport prescribed 7/8; the patient had his MRI which showed a progressive soft  tissue ulceration plantar to the fifth MTP joint with increased soft tissue enhancement throughout the small toe no abscess was noted. There was progressive signal changes in the fifth metatarsal head and fifth proximal phalanx without cortical destruction or definite abnormal T1 signal. These findings are nonspecific could be secondary to hyperemia or early osteomyelitis. He is on amoxicillin as  directed by Dr. Earleen Newport. There were plans to do a bone biopsy and culture done in the OR on Friday however the patient canceled this because of insurance issues [surgical center not in [place]. In any case I have told him that this procedure in my opinion needs to be done. He is using Silvadene cream READMISSION 02/07/2020 This is a patient who had a progressive ulceration over the fifth MTP. We had him here in June and into the early July. He was also followed by [name] of podiatry. The day after we last saw him on 7/9 he went underwent a excision of the fifth MTP to aid in wound healing over this area. Pathology did not show osteomyelitis. Since then he has been following with Dr. Earleen Newport. Notable that earlier this month he presented with worse wound with purulence. CandS showed MSSA and he was treated with doxycycline and Silvadene cream. More recently he has been changed to Augmentin. He is using Betadine to the wound. He has a forefoot offloading shoe at some point in the postoperative. This wound opened in the surgical line. He is here for our review of this. With regards to his diabetes I spent some time on this today. This was diagnosed in his mid 41s he was on pills for a while and then on insulin. When he was here last time he told me that he thought he had had antipancreatic antibodies that showed he was type I if so I do not see this. He followed for a period of time with Dr. Meredith Pel endocrinology at [facility] who I gather is currently independent practice in [facility]. I have looked through her notes as it applies to what is available in care everywhere and she consistently calls him a type II diabetic. If he had other studies of his diabetes I do not see this. I have therefore changed my label of him to type II ABI in our clinic was 1.41 on the left 9/13; arrives in clinic today with things not looking as good. He has a small linear wound in the incision line but nonviable rolled  edges and debris around the circumference. As well it looks as though he had a deep tissue injury over the base of the fifth metatarsal. Raised may be blistered but no open area here. Also concerning erythema around the wound both distally and proximally 9/20; PCR culture I did last time showed high Enterococcus faecalis, medium MRSA and 3 different anaerobic's including medium Peptostreptococcus magnus. I gave him a combination of Augmentin and trimethoprim sulfamethoxazole starting on 9/16 for 10 days. He is tolerating these well He comes in today. His original wound area at the surgical site only has a small crevice we are using endoform here. Not surprisingly the area over the base of the fifth metatarsal has open with a superficial wound. He has a wrap/surgical shoe injury dorsally on the left foot he is asking about a total contact cast and I think what I know about this area suggests that the lateral part of his foot is a weightbearing surface and I think we could certainly justify this. 9/27; patient  is finishing his antibiotics. His surgical wound is actually closed the area over the base of the left fifth metatarsal of is also closed but still with some eschar. 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 As such he actually walks on the outside of his foot. He has been in touch with [facility] Liliane Channel is their orthotic specialist READMISSION 08/20/2020 This is a 55 year old man who is a type II diabetic. 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 He has been in this clinic before with wounds over his left fifth and left fourth met head. He has a history of osteomyelitis in the left fifth met head requiring an excision of the fifth MTP.. The patient tells [place] that he has an AFO brace now to correct the ankle deformity. Sometime in February he developed a callus over  the fourth metatarsal head ultimately Dr. Earleen Newport open this area to expose the wound. He used Silvadene cream for a while most recently Promogran. He has had antibiotics as well including doxycycline and apparently currently Augmentin although I am not certain there is been any cultures. He states the area has not been x-rayed. 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 The met head here is subluxed. His x-ray that we ordered last week was negative for osteomyelitis. Clinically there does not appear to be infection here. No erythema no drainage the wound does not probe to bone 4/4; put a total contact cast on him last week primary dressing of silver collagen he arrives in clinic today with a wound measuring larger and as opposed to last week a direct probe to bone. He is definitely going to need an MRI 4/11; miraculously the divot the probe to bone his filled in this week. Unfortunately his MRI is not till next Monday. We have been using silver collagen under a total contact cast. He has completed his antibiotics 4/18; no palpable bone. The distal half of this wound is has depth but there is no exposed bone. More proximal is at the level of the surrounding skin. His MRI is being delayed because of insurance issues was supposed to be today then Wednesday and now we are not exactly sure what the date is or what the issue is. In general things look somewhat better. We have been using silver collagen under the total contact cast Objective Constitutional Patient is hypertensive.. Pulse regular and within target range for patient.Marland Kitchen Respirations regular, non-labored and within target range.. Temperature is normal and within the target range for the patient.Marland Kitchen Appears in no distress. Vitals Time Taken: 11:10 AM, Height:  76 in, Weight: 260 lbs, BMI: 31.6, Temperature: 98.6 F, Pulse: 77 bpm, Respiratory Rate: 16 breaths/min, Blood Pressure: 147/71 mmHg. General Notes: Wound exam; left fourth metatarsal head. The small divot does not not probe to bone. There was some slough at the base of this that washes out. There is no exposed bone. There is no surrounding infection Integumentary (Hair, Skin) Wound #6 status is Open. Original cause of wound was Gradually Appeared. The date acquired was: 07/03/2020. The wound has been in treatment 4 weeks. The wound is located on the Left Metatarsal head fourth. The wound measures 1cm length  x 1.5cm width x 0.4cm depth; 1.178cm^2 area and 0.471cm^3 volume. There is Fat Layer (Subcutaneous Tissue) exposed. There is no tunneling or undermining noted. There is a medium amount of purulent drainage noted. The wound margin is well defined and not attached to the wound base. There is large (67-100%) pink granulation within the wound bed. There is a small (1-33%) amount of necrotic tissue within the wound bed including Adherent Slough. Assessment Active Problems ICD-10 Type 2 diabetes mellitus with foot ulcer Non-pressure chronic ulcer of other part of left foot with other specified severity Procedures Wound #6 Pre-procedure diagnosis of Wound #6 is a Diabetic Wound/Ulcer of the Lower Extremity located on the Left Metatarsal head fourth . There was a T Contact otal Cast Procedure by [name]., MD. Post procedure Diagnosis Wound #6: Same as Pre-Procedure Plan Follow-up Appointments: Return Appointment in 1 week. Bathing/ Shower/ Hygiene: May shower and wash wound with soap and water. - on days that dressing is changed Off-Loading: T Contact Cast to Left Lower Extremity otal WOUND #6: - Metatarsal head fourth Wound Laterality: Left Cleanser: Wound Cleanser 1 x Per Week/ Discharge Instructions: Cleanse the wound with wound cleanser prior to applying a clean dressing  using gauze sponges, not tissue or cotton balls. Prim Dressing: FIBRACOL Plus Dressing, 2x2 in (collagen) 1 x Per Week/ ary Discharge Instructions: Moisten collagen with saline or hydrogel Secondary Dressing: Woven Gauze Sponges 2x2 in (Generic) 1 x Per Week/ Discharge Instructions: Apply over primary dressing as directed. Secondary Dressing: Drawtex 4x4 in 1 x Per Week/ Discharge Instructions: Cut drawtex to fit inside wound Secured With: 64M Medipore H Soft Cloth Surgical T 4 x 2 (in/yd) 1 x Per Week/ ape Discharge Instructions: Secure dressing with tape as directed. #1 we continued with collagen with a total contact cast [not silver with MRI pending" 2. When we hear when the MRI is he will have to come back and have the cast removed and then replaced 3. No obvious infection here and there has been some improvements in the last 2 weeks Electronic Signature(s) Signed: 09/17/2020 12:08:16 PM By: [name] MD Entered By: [name] on 09/17/2020 12:08:16 -------------------------------------------------------------------------------- Total Contact Cast Details Patient Name: Date of Service: [facility], [place] 09/18/3788 11:00 A M Medical Record Number: [id number] Patient Account Number: [id number] Date of Birth/Sex: Treating RN: [birth year] (55 y.o. [name] Primary Care Provider: [name] Other Clinician: Referring Provider: Treating Provider/Extender: Elwyn Lade in Treatment: 4 T Contact Cast Applied for Wound Assessment: otal Wound #6 Left Metatarsal head fourth Performed By: Physician [name]., MD Post Procedure Diagnosis Same as Pre-procedure Electronic Signature(s) Signed: 09/17/2020 5:05:50 PM By: [name] MD Entered By: [name] on 09/17/2020 12:07:54 -------------------------------------------------------------------------------- SuperBill Details Patient Name: Date of Service: [facility], [place] 9/92/4268 Medical Record Number: [id number] Patient Account Number: [id number] Date of Birth/Sex: Treating RN: [birth year] (55 y.o. [name] Primary Care Provider: [name] Other Clinician: Referring Provider: Treating Provider/Extender: [name], [name] in Treatment: 4 Diagnosis Coding ICD-10 Codes Code Description [phone] Type 2 diabetes mellitus with foot ulcer L97.528 Non-pressure chronic ulcer of other part of left foot with other specified severity Facility Procedures CPT4 Code: [id number] Description: 29445 - APPLY TOTAL CONTACT LEG CAST ICD-10 Diagnosis Description E11.621 Type 2 diabetes mellitus with foot ulcer L97.528 Non-pressure chronic ulcer of other part of left foot with other specified sever Modifier: ity Quantity: 1 Physician Procedures : CPT4 Code  Description Modifier [id number] [phone] - WC PHYS APPLY TOTAL CONTACT CAST ICD-10 Diagnosis Description E11.621 Type 2 diabetes mellitus with foot ulcer L97.528 Non-pressure chronic ulcer of other part of left foot with other specified severity Quantity: 1 Electronic Signature(s) Signed: 09/17/2020 5:05:50 PM By: [name] MD Entered By: [name] on 09/17/2020 12:07:29

## 2020-09-18 ENCOUNTER — Encounter (HOSPITAL_BASED_OUTPATIENT_CLINIC_OR_DEPARTMENT_OTHER): Payer: BLUE CROSS/BLUE SHIELD | Admitting: Internal Medicine

## 2020-09-18 NOTE — Progress Notes (Signed)
[name], [name] ([id number]) Visit Report for 09/17/2020 Arrival Information Details Patient Name: Date of Service: [facility], [place] 10/10/209 11:00 A M Medical Record Number: [id number] Patient Account Number: [id number] Date of Birth/Sex: Treating RN: [birth year] (55 y.o. [name] Primary Care Mohammedali Bedoy: [name] Other Clinician: Referring Joscelynn Brutus: Treating Kenedi Cilia/Extender: Elwyn Lade in Treatment: 4 Visit Information History Since Last Visit Added or deleted any medications: No Patient Arrived: Knee Scooter Any new allergies or adverse reactions: No Arrival Time: 11:10 Had a fall or experienced change in No Accompanied By: alone activities of daily living that may affect Transfer Assistance: None risk of falls: Patient Identification Verified: Yes Signs or symptoms of abuse/neglect since last visito No Secondary Verification Process Completed: Yes Hospitalized since last visit: No Implantable device outside of the clinic excluding No cellular tissue based products placed in the center since last visit: Has Dressing in Place as Prescribed: Yes Has Footwear/Offloading in Place as Prescribed: Yes Left: T Contact Cast otal Pain Present Now: No Electronic Signature(s) Signed: 09/17/2020 5:39:31 PM By: [name] RN, BSN Entered By: [name] on 09/17/2020 11:13:07 -------------------------------------------------------------------------------- Encounter Discharge Information Details Patient Name: Date of Service: [facility], [name]. 06/04/129 11:00 A M Medical Record Number: [id number] Patient Account Number: [id number] Date of Birth/Sex: Treating RN: 1966/01/17 (55 y.o. [name] Primary Care Kurtis Anastasia: [name] Other Clinician: Referring Horrace Hanak: Treating Charnice Zwilling/Extender: Elwyn Lade in Treatment: 4 Encounter Discharge Information Items Discharge Condition:  Stable Ambulatory Status: Ambulatory Discharge Destination: Home Transportation: Private Auto Accompanied By: self Schedule Follow-up Appointment: Yes Clinical Summary of Care: Electronic Signature(s) Signed: 09/17/2020 5:05:09 PM By: Deon Pilling Entered By: Deon Pilling on 09/17/2020 12:05:21 -------------------------------------------------------------------------------- Lower Extremity Assessment Details Patient Name: Date of Service: [facility], [name] 12/28/2058 11:00 A M Medical Record Number: [id number] Patient Account Number: [id number] Date of Birth/Sex: Treating RN: 15-Jul-1965 (54 y.o. [name] Primary Care [name]: [name] Other Clinician: Referring Mattye Verdone: Treating Steffon Gladu/Extender: [name], [name] in Treatment: 4 Edema Assessment Assessed: Shirlyn Goltz: No] Patrice Paradise: No] Edema: [Left: N] [Right: o] Calf Left: Right: Point of Measurement: 33 cm From Medial Instep 35 cm Ankle Left: Right: Point of Measurement: 8 cm From Medial Instep 22.5 cm Vascular Assessment Pulses: Dorsalis Pedis Palpable: [Left:Yes] Electronic Signature(s) Signed: 09/17/2020 5:39:31 PM By: [name] RN, BSN Entered By: [name] on 09/17/2020 11:19:00 -------------------------------------------------------------------------------- Multi Wound Chart Details Patient Name: Date of Service: [facility], [name] 08/26/6145 11:00 A M Medical Record Number: [id number] Patient Account Number: [id number] Date of Birth/Sex: Treating RN: 08-17-1965 (55 y.o. [name] Primary Care [name]: [name] Other Clinician: Referring [name]: Treating [name]/Extender: [name], [name] in Treatment: 4 Vital Signs Height(in): 76 Pulse(bpm): 67 Weight(lbs): 260 Blood Pressure(mmHg): 147/71 Body Mass Index(BMI): 32 Temperature(F): 98.6 Respiratory Rate(breaths/min): 16 Photos: [6:No Photos Left Metatarsal head  fourth] [N/A:N/A N/A] Wound Location: [6:Gradually Appeared] [N/A:N/A] Wounding Event: [6:Diabetic Wound/Ulcer of the Lower] [N/A:N/A] Primary Etiology: [6:Extremity Cataracts, Hypertension, Type II] [N/A:N/A] Comorbid History: [6:Diabetes, Osteomyelitis, Neuropathy 07/03/2020] [N/A:N/A] Date Acquired: [6:4] [N/A:N/A] Weeks of Treatment: [6:Open] [N/A:N/A] Wound Status: [6:1x1.5x0.4] [N/A:N/A] Measurements L x W x D (cm) [6:1.178] [N/A:N/A] A (cm) : rea [6:0.471] [N/A:N/A] Volume (cm) : [6:16.70%] [N/A:N/A] % Reduction in A rea: [6:16.60%] [N/A:N/A] % Reduction in Volume: [6:Grade 1] [N/A:N/A] Classification: [6:Medium] [N/A:N/A] Exudate A mount: [6:Purulent] [N/A:N/A] Exudate Type: [6:yellow, brown, green] [N/A:N/A] Exudate Color: [6:Well defined, not attached] [N/A:N/A]  Wound Margin: [6:Large (67-100%)] [N/A:N/A] Granulation A mount: [6:Pink] [N/A:N/A] Granulation Quality: [6:Small (1-33%)] [N/A:N/A] Necrotic A mount: [6:Fat Layer (Subcutaneous Tissue): Yes N/A] Exposed Structures: [6:Fascia: No Tendon: No Muscle: No Joint: No Bone: No Small (1-33%)] [N/A:N/A] Epithelialization: [6:T Contact Cast otal] [N/A:N/A] Treatment Notes Electronic Signature(s) Signed: 09/17/2020 5:05:50 PM By: [name] MD Signed: 09/17/2020 5:39:31 PM By: [name] RN, BSN Entered By: [name] on 09/17/2020 12:03:05 -------------------------------------------------------------------------------- Multi-Disciplinary Care Plan Details Patient Name: Date of Service: [facility], [place] 8/89/1694 11:00 A M Medical Record Number: [id number] Patient Account Number: [id number] Date of Birth/Sex: Treating RN: 1966/02/06 (55 y.o. [name] Primary Care Keelon Zurn: [name] Other Clinician: Referring [name]: Treating Leontina Skidmore/Extender: Elwyn Lade in Treatment: 4 Multidisciplinary Care Plan reviewed with physician Active  Inactive Nutrition Nursing Diagnoses: Impaired glucose control: actual or potential Potential for alteratiion in Nutrition/Potential for imbalanced nutrition Goals: Patient/caregiver agrees to and verbalizes understanding of need to use nutritional supplements and/or vitamins as prescribed Date Initiated: 08/20/2020 Date Inactivated: 09/17/2020 Target Resolution Date: 09/21/2020 Goal Status: Met Patient/caregiver will maintain therapeutic glucose control Date Initiated: 08/20/2020 Target Resolution Date: 10/19/2020 Goal Status: Active Interventions: Assess HgA1c results as ordered upon admission and as needed Assess patient nutrition upon admission and as needed per policy Provide education on elevated blood sugars and impact on wound healing Provide education on nutrition Treatment Activities: Education provided on Nutrition : 08/27/2020 Notes: Wound/Skin Impairment Nursing Diagnoses: Impaired tissue integrity Knowledge deficit related to ulceration/compromised skin integrity Goals: Patient/caregiver will verbalize understanding of skin care regimen Date Initiated: 08/20/2020 Target Resolution Date: 10/19/2020 Goal Status: Active Ulcer/skin breakdown will have a volume reduction of 30% by week 4 Date Initiated: 08/20/2020 Date Inactivated: 09/17/2020 Target Resolution Date: 09/21/2020 Goal Status: Met Interventions: Assess patient/caregiver ability to obtain necessary supplies Assess patient/caregiver ability to perform ulcer/skin care regimen upon admission and as needed Assess ulceration(s) every visit Provide education on ulcer and skin care Notes: Electronic Signature(s) Signed: 09/17/2020 5:39:31 PM By: [name] RN, BSN Entered By: [name] on 09/17/2020 14:12:22 -------------------------------------------------------------------------------- Pain Assessment Details Patient Name: Date of Service: [facility], [place]. 0/34/9179 11:00 A M Medical Record Number:  [id number] Patient Account Number: [id number] Date of Birth/Sex: Treating RN: Mar 19, 1966 (55 y.o. [name] Primary Care Damia Bobrowski: [name] Other Clinician: Referring Dorian Renfro: Treating Melanee Cordial/Extender: [name], [name] in Treatment: 4 Active Problems Location of Pain Severity and Description of Pain Patient Has Paino No Site Locations Pain Management and Medication Current Pain Management: Electronic Signature(s) Signed: 09/17/2020 5:39:31 PM By: [name] RN, BSN Entered By: [name] on 09/17/2020 11:13:30 -------------------------------------------------------------------------------- Patient/Caregiver Education Details Patient Name: Date of Service: [facility], [place] 8/01/[id number] [place] Record Number: [id number] Patient Account Number: [id number] Date of Birth/Gender: Treating RN: 1966-04-05 (55 y.o. [name] Primary Care Physician: [name] Other Clinician: Referring Physician: Treating Physician/Extender: Elwyn Lade in Treatment: 4 Education Assessment Education Provided To: Patient Education Topics Provided Wound/Skin Impairment: Methods: Explain/Verbal Responses: State content correctly Electronic Signature(s) Signed: 09/17/2020 5:39:31 PM By: [name] RN, BSN Entered By: [name] on 09/17/2020 14:12:56 -------------------------------------------------------------------------------- Wound Assessment Details Patient Name: Date of Service: [facility], [place] 06/22/9756 11:00 A M Medical Record Number: [id number] Patient Account Number: [id number] Date of Birth/Sex: Treating RN: 18-Oct-1965 (55 y.o. [name] Primary Care Hajra Port: [name] Other Clinician: Referring Kalise Fickett: Treating Emet Rafanan/Extender: [name], [name] in Treatment: 4  Wound Status Wound Number: 6 Primary Diabetic Wound/Ulcer of the Lower  Extremity Etiology: Wound Location: Left Metatarsal head fourth Wound Status: Open Wounding Event: Gradually Appeared Comorbid Cataracts, Hypertension, Type II Diabetes, Osteomyelitis, Date Acquired: 07/03/2020 History: Neuropathy Weeks Of Treatment: 4 Clustered Wound: No Photos Wound Measurements Length: (cm) 1 Width: (cm) 1.5 Depth: (cm) 0.4 Area: (cm) 1.178 Volume: (cm) 0.471 % Reduction in Area: 16.7% % Reduction in Volume: 16.6% Epithelialization: Small (1-33%) Tunneling: No Undermining: No Wound Description Classification: Grade 1 Wound Margin: Well defined, not attached Exudate Amount: Medium Exudate Type: Purulent Exudate Color: yellow, brown, green Foul Odor After Cleansing: No Slough/Fibrino Yes Wound Bed Granulation Amount: Large (67-100%) Exposed Structure Granulation Quality: Pink Fascia Exposed: No Necrotic Amount: Small (1-33%) Fat Layer (Subcutaneous Tissue) Exposed: Yes Necrotic Quality: Adherent Slough Tendon Exposed: No Muscle Exposed: No Joint Exposed: No Bone Exposed: No Treatment Notes Wound #6 (Metatarsal head fourth) Wound Laterality: Left Cleanser Wound Cleanser Discharge Instruction: Cleanse the wound with wound cleanser prior to applying a clean dressing using gauze sponges, not tissue or cotton balls. Peri-Wound Care Topical Primary Dressing FIBRACOL Plus Dressing, 2x2 in (collagen) Discharge Instruction: Moisten collagen with saline or hydrogel Secondary Dressing Woven Gauze Sponges 2x2 in Discharge Instruction: Apply over primary dressing as directed. Drawtex 4x4 in Discharge Instruction: Cut drawtex to fit inside wound Secured With 18M Medipore H Soft Cloth Surgical T 4 x 2 (in/yd) ape Discharge Instruction: Secure dressing with tape as directed. Compression Wrap Compression Stockings Add-Ons Notes TCC last layer applied by MD. Electronic Signature(s) Signed: 09/17/2020 5:39:31 PM By: [name] RN, BSN Signed:  09/18/2020 8:07:32 AM By: Sandre Kitty Entered By: Sandre Kitty on 09/17/2020 16:15:48 -------------------------------------------------------------------------------- Vitals Details Patient Name: Date of Service: [facility], [place] 9/62/8366 11:00 A M Medical Record Number: [id number] Patient Account Number: [id number] Date of Birth/Sex: Treating RN: February 15, 1966 (55 y.o. [name] Primary Care Krystyn Picking: [name] Other Clinician: Referring Herley Bernardini: Treating Taheem Fricke/Extender: [name], [name] in Treatment: 4 Vital Signs Time Taken: 11:10 Temperature (F): 98.6 Height (in): 76 Pulse (bpm): 77 Weight (lbs): 260 Respiratory Rate (breaths/min): 16 Body Mass Index (BMI): 31.6 Blood Pressure (mmHg): 147/71 Reference Range: 80 - 120 mg / dl Electronic Signature(s) Signed: 09/17/2020 5:39:31 PM By: [name] RN, BSN Entered By: [name] on 09/17/2020 11:13:24

## 2020-09-19 ENCOUNTER — Ambulatory Visit (HOSPITAL_COMMUNITY): Payer: BLUE CROSS/BLUE SHIELD

## 2020-09-20 ENCOUNTER — Ambulatory Visit: Payer: BLUE CROSS/BLUE SHIELD | Admitting: Podiatry

## 2020-09-24 ENCOUNTER — Encounter (HOSPITAL_BASED_OUTPATIENT_CLINIC_OR_DEPARTMENT_OTHER): Payer: BLUE CROSS/BLUE SHIELD | Admitting: Internal Medicine

## 2020-09-26 ENCOUNTER — Other Ambulatory Visit: Payer: Self-pay

## 2020-09-26 ENCOUNTER — Encounter (HOSPITAL_BASED_OUTPATIENT_CLINIC_OR_DEPARTMENT_OTHER): Payer: BLUE CROSS/BLUE SHIELD | Admitting: Internal Medicine

## 2020-09-26 DIAGNOSIS — L97528 Non-pressure chronic ulcer of other part of left foot with other specified severity: Secondary | ICD-10-CM | POA: Diagnosis not present

## 2020-09-26 DIAGNOSIS — E10621 Type 1 diabetes mellitus with foot ulcer: Secondary | ICD-10-CM | POA: Diagnosis not present

## 2020-09-26 DIAGNOSIS — E11621 Type 2 diabetes mellitus with foot ulcer: Secondary | ICD-10-CM | POA: Diagnosis not present

## 2020-09-26 NOTE — Progress Notes (Signed)
PEDRAM, GOODCHILD ([id number]) Visit Report for 09/26/2020 Arrival Information Details Patient Name: Date of Service: [facility], [place] 1/96/2229 7:30 A M Medical Record Number: [id number] Patient Account Number: [id number] Date of Birth/Sex: Treating RN: Oct 13, 1965 (55 y.o. Marcheta Grammes Primary Care Shaunice Levitan: [name] Other Clinician: Referring Victorian Gunn: Treating Loyed Wilmes/Extender: Warren Danes, [name] in Treatment: 5 Visit Information History Since Last Visit Added or deleted any medications: No Patient Arrived: Knee Scooter Any new allergies or adverse reactions: No Arrival Time: 07:55 Had a fall or experienced change in No Transfer Assistance: None activities of daily living that may affect Patient Identification Verified: Yes risk of falls: Secondary Verification Process Completed: Yes Signs or symptoms of abuse/neglect since last visito No Patient Requires Transmission-Based Precautions: No Hospitalized since last visit: No Patient Has Alerts: No Implantable device outside of the clinic excluding No cellular tissue based products placed in the center since last visit: Has Dressing in Place as Prescribed: Yes Has Footwear/Offloading in Place as Prescribed: Yes Left: T Contact Cast otal Pain Present Now: No Electronic Signature(s) Signed: 09/26/2020 5:05:20 PM By: Lorrin Jackson Entered By: Lorrin Jackson on 09/26/2020 07:55:41 -------------------------------------------------------------------------------- Encounter Discharge Information Details Patient Name: Date of Service: [facility], [place] 1/74/0814 7:30 A M Medical Record Number: [id number] Patient Account Number: [id number] Date of Birth/Sex: Treating RN: 06-12-1965 (55 y.o. Marcheta Grammes Primary Care Jule Whitsel: [name] Other Clinician: Referring Tammy Ericsson: Treating Min Tunnell/Extender: Warren Danes, [name] in Treatment: 5 Encounter Discharge  Information Items Post Procedure Vitals Discharge Condition: Stable Temperature (F): 98.7 Ambulatory Status: Walker Pulse (bpm): 71 Discharge Destination: Home Respiratory Rate (breaths/min): 16 Transportation: Private Auto Blood Pressure (mmHg): 156/80 Schedule Follow-up Appointment: Yes Clinical Summary of Care: Provided on 09/26/2020 Form Type Recipient Paper Patient Patient Notes Uses knee scooter Electronic Signature(s) Signed: 09/26/2020 8:24:08 AM By: Lorrin Jackson Entered By: Lorrin Jackson on 09/26/2020 08:24:08 -------------------------------------------------------------------------------- Lower Extremity Assessment Details Patient Name: Date of Service: [facility], [place] 9/70/2637 7:30 A M Medical Record Number: [id number] Patient Account Number: [id number] Date of Birth/Sex: Treating RN: 10/12/1965 (55 y.o. Marcheta Grammes Primary Care Yani Lal: [name] Other Clinician: Referring Natan Hartog: Treating Elliannah Wayment/Extender: Warren Danes, [name] in Treatment: 5 Edema Assessment Assessed: Shirlyn Goltz: Yes] [Right: No] Edema: [Left: N] [Right: o] Calf Left: Right: Point of Measurement: 33 cm From Medial Instep 36 cm Ankle Left: Right: Point of Measurement: 8 cm From Medial Instep 22 cm Vascular Assessment Pulses: Dorsalis Pedis Palpable: [Left:Yes] Electronic Signature(s) Signed: 09/26/2020 5:05:20 PM By: Lorrin Jackson Entered By: Lorrin Jackson on 09/26/2020 08:06:50 -------------------------------------------------------------------------------- Multi Wound Chart Details Patient Name: Date of Service: [facility], [place] 09/11/8784 7:30 A M Medical Record Number: [id number] Patient Account Number: [id number] Date of Birth/Sex: Treating RN: 1966/03/28 (55 y.o. Ernestene Mention Primary Care Aeliana Spates: [name] Other Clinician: Referring Cinch Ormond: Treating Carmisha Larusso/Extender: Warren Danes, [name] in  Treatment: 5 Vital Signs Height(in): 76 Pulse(bpm): 71 Weight(lbs): 260 Blood Pressure(mmHg): 156/80 Body Mass Index(BMI): 32 Temperature(F): 98.7 Respiratory Rate(breaths/min): 16 Photos: [6:No Photos Left Metatarsal head fourth] [N/A:N/A N/A] Wound Location: [6:Gradually Appeared] [N/A:N/A] Wounding Event: [6:Diabetic Wound/Ulcer of the Lower] [N/A:N/A] Primary Etiology: [6:Extremity Cataracts, Hypertension, Type II] [N/A:N/A] Comorbid History: [6:Diabetes, Osteomyelitis, Neuropathy 07/03/2020] [N/A:N/A] Date Acquired: [6:5] [N/A:N/A] Weeks of Treatment: [6:Open] [N/A:N/A] Wound Status: [6:0.5x0.6x0.3] [N/A:N/A] Measurements L x W x D (cm) [6:0.236] [N/A:N/A] A (cm) : rea [6:0.071] [N/A:N/A] Volume (cm) : [6:83.30%] [N/A:N/A] % Reduction in A rea: [6:87.40%] [  N/A:N/A] % Reduction in Volume: [6:Grade 1] [N/A:N/A] Classification: [6:Medium] [N/A:N/A] Exudate A mount: [6:Purulent] [N/A:N/A] Exudate Type: [6:yellow, brown, green] [N/A:N/A] Exudate Color: [6:Well defined, not attached] [N/A:N/A] Wound Margin: [6:Large (67-100%)] [N/A:N/A] Granulation A mount: [6:Pink, Pale] [N/A:N/A] Granulation Quality: [6:Small (1-33%)] [N/A:N/A] Necrotic A mount: [6:Fat Layer (Subcutaneous Tissue): Yes N/A] Exposed Structures: [6:Fascia: No Tendon: No Muscle: No Joint: No Bone: No Medium (34-66%)] [N/A:N/A] Epithelialization: [6:Debridement - Excisional] [N/A:N/A] Debridement: Pre-procedure Verification/Time Out 08:15 [N/A:N/A] Taken: [6:Other] [N/A:N/A] Pain Control: [6:Callus, Subcutaneous, Slough] [N/A:N/A] Tissue Debrided: [6:Skin/Subcutaneous Tissue] [N/A:N/A] Level: [6:1] [N/A:N/A] Debridement A (sq cm): [6:rea Curette] [N/A:N/A] Instrument: [6:Minimum] [N/A:N/A] Bleeding: [6:Pressure] [N/A:N/A] Hemostasis A chieved: [6:0] [N/A:N/A] Procedural Pain: [6:0] [N/A:N/A] Post Procedural Pain: [6:Procedure was tolerated well] [N/A:N/A] Debridement Treatment Response: [6:0.5x0.4x0.3]  [N/A:N/A] Post Debridement Measurements L x W x D (cm) [6:0.047] [N/A:N/A] Post Debridement Volume: (cm) [6:Slight maceration, calloused periwound N/A] Assessment Notes: [6:Debridement] [N/A:N/A] Procedures Performed: [6:T Contact Cast otal] Treatment Notes Electronic Signature(s) Signed: 09/26/2020 8:47:46 AM By: Kalman Shan DO Signed: 09/26/2020 5:51:39 PM By: Baruch Gouty RN, BSN Entered By: Kalman Shan on 09/26/2020 08:23:51 -------------------------------------------------------------------------------- Multi-Disciplinary Care Plan Details Patient Name: Date of Service: [facility], [place] 09/24/9561 7:30 A M Medical Record Number: [id number] Patient Account Number: [id number] Date of Birth/Sex: Treating RN: 11/04/1965 (55 y.o. Ernestene Mention Primary Care Rahmir Beever: [name] Other Clinician: Referring Delesa Kawa: Treating Amaka Gluth/Extender: Delma Officer in Treatment: 5 Multidisciplinary Care Plan reviewed with physician Active Inactive Nutrition Nursing Diagnoses: Impaired glucose control: actual or potential Potential for alteratiion in Nutrition/Potential for imbalanced nutrition Goals: Patient/caregiver agrees to and verbalizes understanding of need to use nutritional supplements and/or vitamins as prescribed Date Initiated: 08/20/2020 Date Inactivated: 09/17/2020 Target Resolution Date: 09/21/2020 Goal Status: Met Patient/caregiver will maintain therapeutic glucose control Date Initiated: 08/20/2020 Target Resolution Date: 10/19/2020 Goal Status: Active Interventions: Assess HgA1c results as ordered upon admission and as needed Assess patient nutrition upon admission and as needed per policy Provide education on elevated blood sugars and impact on wound healing Provide education on nutrition Treatment Activities: Education provided on Nutrition : 08/20/2020 Notes: Wound/Skin Impairment Nursing Diagnoses: Impaired  tissue integrity Knowledge deficit related to ulceration/compromised skin integrity Goals: Patient/caregiver will verbalize understanding of skin care regimen Date Initiated: 08/20/2020 Target Resolution Date: 10/19/2020 Goal Status: Active Ulcer/skin breakdown will have a volume reduction of 30% by week 4 Date Initiated: 08/20/2020 Date Inactivated: 09/17/2020 Target Resolution Date: 09/21/2020 Goal Status: Met Interventions: Assess patient/caregiver ability to obtain necessary supplies Assess patient/caregiver ability to perform ulcer/skin care regimen upon admission and as needed Assess ulceration(s) every visit Provide education on ulcer and skin care Notes: Electronic Signature(s) Signed: 09/26/2020 5:51:39 PM By: Baruch Gouty RN, BSN Entered By: Baruch Gouty on 09/26/2020 08:58:37 -------------------------------------------------------------------------------- Pain Assessment Details Patient Name: Date of Service: [facility], [place]. 10/18/8414 7:30 A M Medical Record Number: [id number] Patient Account Number: [id number] Date of Birth/Sex: Treating RN: 1965/11/25 (55 y.o. Marcheta Grammes Primary Care Reanne Nellums: [name] Other Clinician: Referring Radiance Deady: Treating Sallye Lunz/Extender: Warren Danes, [name] in Treatment: 5 Active Problems Location of Pain Severity and Description of Pain Patient Has Paino No Site Locations Pain Management and Medication Current Pain Management: Electronic Signature(s) Signed: 09/26/2020 5:05:20 PM By: Lorrin Jackson Entered By: Lorrin Jackson on 09/26/2020 07:58:17 -------------------------------------------------------------------------------- Patient/Caregiver Education Details Patient Name: Date of Service: [facility], [place] 0/93/[id number]:27 [place] Record Number: [id number] Patient Account Number: [id number] Date of Birth/Gender: Treating RN: 08/01/1965 (  55 y.o. Ernestene Mention Primary  Care Physician: [name] Other Clinician: Referring Physician: Treating Physician/Extender: Delma Officer in Treatment: 5 Education Assessment Education Provided To: Patient Education Topics Provided Offloading: Methods: Explain/Verbal Responses: Reinforcements needed, State content correctly Wound/Skin Impairment: Methods: Explain/Verbal Responses: Reinforcements needed, State content correctly Electronic Signature(s) Signed: 09/26/2020 5:51:39 PM By: Baruch Gouty RN, BSN Entered By: Baruch Gouty on 09/26/2020 08:58:57 -------------------------------------------------------------------------------- Wound Assessment Details Patient Name: Date of Service: [facility], [place] 08/29/1914 7:30 A M Medical Record Number: [id number] Patient Account Number: [id number] Date of Birth/Sex: Treating RN: 13-Mar-1966 (55 y.o. Marcheta Grammes Primary Care Elisabella Hacker: [name] Other Clinician: Referring Tomia Enlow: Treating Deaisha Welborn/Extender: Warren Danes, [name] in Treatment: 5 Wound Status Wound Number: 6 Primary Diabetic Wound/Ulcer of the Lower Extremity Etiology: Wound Location: Left Metatarsal head fourth Wound Status: Open Wounding Event: Gradually Appeared Comorbid Cataracts, Hypertension, Type II Diabetes, Osteomyelitis, Date Acquired: 07/03/2020 History: Neuropathy Weeks Of Treatment: 5 Clustered Wound: No Photos Wound Measurements Length: (cm) 0.5 Width: (cm) 0.6 Depth: (cm) 0.3 Area: (cm) 0.236 Volume: (cm) 0.071 % Reduction in Area: 83.3% % Reduction in Volume: 87.4% Epithelialization: Medium (34-66%) Tunneling: No Undermining: No Wound Description Classification: Grade 1 Wound Margin: Well defined, not attached Exudate Amount: Medium Exudate Type: Purulent Exudate Color: yellow, brown, green Foul Odor After Cleansing: No Slough/Fibrino Yes Wound Bed Granulation Amount: Large (67-100%) Exposed  Structure Granulation Quality: Pink, Pale Fascia Exposed: No Necrotic Amount: Small (1-33%) Fat Layer (Subcutaneous Tissue) Exposed: Yes Necrotic Quality: Adherent Slough Tendon Exposed: No Muscle Exposed: No Joint Exposed: No Bone Exposed: No Assessment Notes Slight maceration, calloused periwound Treatment Notes Wound #6 (Metatarsal head fourth) Wound Laterality: Left Cleanser Wound Cleanser Discharge Instruction: Cleanse the wound with wound cleanser prior to applying a clean dressing using gauze sponges, not tissue or cotton balls. Peri-Wound Care Topical Primary Dressing Maxorb Extra Calcium Alginate 2x2 in Discharge Instruction: Apply calcium alginate to wound bed as instructed Secondary Dressing Woven Gauze Sponges 2x2 in Discharge Instruction: Apply over primary dressing as directed. Optifoam Non-Adhesive Dressing, 4x4 in Discharge Instruction: Apply over primary dressing cut to make foam donut Secured With [facility] T 4 x 2 (in/yd) ape Discharge Instruction: Secure dressing with tape as directed. Compression Wrap Compression Stockings Add-Ons Electronic Signature(s) Signed: 09/26/2020 4:46:04 PM By: Sandre Kitty Signed: 09/26/2020 5:05:20 PM By: Lorrin Jackson Entered By: Sandre Kitty on 09/26/2020 16:22:46 -------------------------------------------------------------------------------- Vitals Details Patient Name: Date of Service: [facility], [place] 7/74/1423 7:30 A M Medical Record Number: [id number] Patient Account Number: [id number] Date of Birth/Sex: Treating RN: 1966/06/02 (55 y.o. Marcheta Grammes Primary Care Dalon Reichart: [name] Other Clinician: Referring Jazelyn Sipe: Treating Dane Kopke/Extender: Warren Danes, [name] in Treatment: 5 Vital Signs Time Taken: 07:57 Temperature (F): 98.7 Height (in): 76 Pulse (bpm): 71 Weight (lbs): 260 Respiratory Rate (breaths/min): 16 Body Mass Index  (BMI): 31.6 Blood Pressure (mmHg): 156/80 Reference Range: 80 - 120 mg / dl Electronic Signature(s) Signed: 09/26/2020 5:05:20 PM By: Lorrin Jackson Entered By: Lorrin Jackson on 09/26/2020 07:58:05

## 2020-09-26 NOTE — Progress Notes (Signed)
ANAY, RATHE ([id number]) Visit Report for 09/26/2020 Chief Complaint Document Details Patient Name: Date of Service: [facility], [place] 09/15/6061 7:30 A M Medical Record Number: [id number] Patient Account Number: [id number] Date of Birth/Sex: Treating RN: 1965/07/29 (55 y.o. Ernestene Mention Primary Care Provider: [name] Other Clinician: Referring Provider: Treating Provider/Extender: Warren Danes, [name] in Treatment: 5 Information Obtained from: Patient Chief Complaint 05/04/2018; patient is here for review of a wound over the left plantar fourth metatarsal head referred by [name] of podiatric surgery. 11/22/2019; patient is here for review of the wound on the plantar left fifth metatarsal head. She was referred by [name] 08/20/2020; patient is here for review of wound on the plantar fourth met head Electronic Signature(s) Signed: 09/26/2020 8:47:46 AM By: Kalman Shan DO Entered By: Kalman Shan on 09/26/2020 08:27:52 -------------------------------------------------------------------------------- Debridement Details Patient Name: Date of Service: [facility], [place] 3/55/7322 7:30 A M Medical Record Number: [id number] Patient Account Number: [id number] Date of Birth/Sex: Treating RN: November 03, 1965 (55 y.o. Ernestene Mention Primary Care Provider: [name] Other Clinician: Referring Provider: Treating Provider/Extender: Warren Danes, [name] in Treatment: 5 Debridement Performed for Assessment: Wound #6 Left Metatarsal head fourth Performed By: Physician Kalman Shan, DO Debridement Type: Debridement Severity of Tissue Pre Debridement: Fat layer exposed Level of Consciousness (Pre-procedure): Awake and Alert Pre-procedure Verification/Time Out Yes - 08:15 Taken: Start Time: 08:20 Pain Control: Other : benzocaine 20% spray T Area Debrided (L x W): otal 1 (cm) x 1 (cm) = 1 (cm) Tissue and other  material debrided: Viable, Non-Viable, Callus, Slough, Subcutaneous, Skin: Epidermis, Slough Level: Skin/Subcutaneous Tissue Debridement Description: Excisional Instrument: Curette Bleeding: Minimum Hemostasis Achieved: Pressure End Time: 08:22 Procedural Pain: 0 Post Procedural Pain: 0 Response to Treatment: Procedure was tolerated well Level of Consciousness (Post- Awake and Alert procedure): Post Debridement Measurements of Total Wound Length: (cm) 0.5 Width: (cm) 0.4 Depth: (cm) 0.3 Volume: (cm) 0.047 Character of Wound/Ulcer Post Debridement: Improved Severity of Tissue Post Debridement: Fat layer exposed Post Procedure Diagnosis Same as Pre-procedure Electronic Signature(s) Signed: 09/26/2020 8:47:46 AM By: Kalman Shan DO Signed: 09/26/2020 5:51:39 PM By: Baruch Gouty RN, BSN Entered By: Baruch Gouty on 09/26/2020 08:22:48 -------------------------------------------------------------------------------- HPI Details Patient Name: Date of Service: [facility], [place] 3/76/2831 7:30 A M Medical Record Number: [id number] Patient Account Number: [id number] Date of Birth/Sex: Treating RN: 1965/11/14 (56 y.o. Ernestene Mention Primary Care Provider: [name] Other Clinician: Referring Provider: Treating Provider/Extender: Warren Danes, [name] in Treatment: 5 History of Present Illness HPI Description: ADMISSION 05/04/2018 This is a 55 year old man who apparently is a type I diabetic confirmed by appropriate serology testing 6 years ago. He is therefore on insulin. His most recent hemoglobin A1c was 10.7 on 03/27/2018. 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 As a result of a combination of this he is insensate and even has reduced sensation in his upper extremities. His current problem  started in early July he noted a blister over the left fourth plantar metatarsal head. He saw [name] his podiatrist on 01/07/2018. At that time he had a wound that measured 2 x 1 x 0.4 cm. An x-ray did not show osteomyelitis. It did not appear that he actually followed up. He was admitted to hospital on 03/25/2018 through 03/30/2018 with his left foot painful and  swollen. He underwent a surgical IandD by [name]. I believe his culture at that time showed Enterococcus faecalis and a bone culture showed enterococcus avium. He is been reviewed by [name] of infectious disease although I have not reviewed his note and he has been on 6 weeks worth of ertapenem which should finish sometime apparently on December 9.. MRI suggested possibility of septic arthritis but did not comment specifically on osteomyelitis. A more recent wound culture was negative. The patient has been referred here for consideration among other things of hyperbaric oxygen versus proceeding with a partial ray amputation. This would involve excision of the fourth metatarsal head. The patient is offloading this and a Darco forefoot off loader. I think he is using iodoform packing. 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 Certainly would be a possibility of a total contact cast. I did not see any arterial studies on him however his peripheral pulses are palpable 05/11/2018; patient started his hyperbarics on Friday but did not dive yesterday for personal issues. He saw [name] on 12//19 he notes that during the hospitalization in October 2019 he had an abscess and underwent a bone biopsy with culture that showed group B strep, enterococcus avium and Enterococcus faecalis. There was Prevotella from the abscess. He was put on ertapenem for 6 weeks which he completed. Per [name] notable for the fact that he is not been treated with ampicillin or amoxicillin. His hemoglobin A1c was noted to be high at  10.7. He is started on ampicillin sulbactam every 6 again I think this is for better coverage for the enterococcus. We started him on silver alginate as there is still purulent looking drainage coming out of the wound and there is indeed exposed bone. The patient has not been unwell 05/18/2018; patient continues with hyperbarics he is tolerating this well and has no complaints. He is on Unasyn every 6. We have been using silver alginate to the area over the fourth plantar metatarsal head. He is a poorly controlled diabetic. There is certainly less purulent drainage than when I first saw this man at the beginning of the month. He only sedimentation rate I see on him was 40 on 03/26/2018. States his blood sugars are under better control in the 120s to 130 range. Follows with his endocrinologist in [facility] I change his primary dressing to Prisma today attempting to increase the granulation. After I left the room he called [place] back into look at a area that he noticed on the right foot 2 days ago. Very superficial and in the medial aspect of his high plantar arch [secondary to his neuropathy] he has a custom insert in the shoe on the right foot. 05/24/2018; continues with HBO doing well. He saw infectious disease today and is still on Unasyn every 6 hours. Not making a lot of progress on the left foot with Prisma I changed him to silver alginate strips. The new area on the right midfoot he is putting silver alginate on this as well. 06/01/2018; patient remains on Unasyn. Not making a lot of progress with granulation. I am going to put him in a total contact cast use Prisma for now. He has a high co-pay for an advanced treatment option but I suspect feeling in this wound may come to that 06/04/2018; patient back for the obligatory first total contact cast change. We continue to use Prisma 06/08/2018; the patient is tolerating the cast well although he had some loosening  over the tibia which rubbed when he  walked. There is no lesion here. We have been using Prisma under the total contact cast. He is in hyperbarics which she is tolerating well 06/15/2018; tolerating total contact cast and hyperbarics well. He continues IV antibiotics and for the underlying osteomyelitis as directed by infectious disease. His wound is making really nice progress 1/21; Patient's wound is down in depth. Using silver collagen. Ab's end this week and Pic line is to be d/ced 1/28; 0.8 cm in depth which is more than last time. We have been using silver collagen under total contact cast. He continues in hyperbarics. His IV antibiotics are finished PICC line has been removed 2/4; surprisingly today the area is actually epithelialized. There is no open area. There is however a divot where the epithelium is gone down to close the wound surface. I am not completely convinced that this is going to remain viable over time but I think time is the best thing to determine this. There is no evidence of surrounding infection. As of today I do not think a repeat MRI or any other procedure is going to be indicative of whether this is going to maintain a closed state. I am going to continue him in a total contact cast. He will complete HBO. Next week I will allow him perhaps to graduate into a Darco forefoot off loader, then a healing sandal. I will then send him back to podiatry to see if they can fashion an offloading shoe if this area remains closed 2/10; the patient was taken out of the cast today the area is healed. He has a divot however this is fully epithelialized and the epithelialized tissue looks healthy. I have put him into a Darco forefoot off loader. He will complete his last 2 hyperbaric treatments. I will see him next week and I may transition him into a surgical shoe versus his own shoe. He is following up with [name] of podiatry and I think a diabetic shoe with custom inserts could be provided with the signature provided  by his endocrinologist ADMISSION 6/22 CLINICAL DATA: Foot ulcer, question of osteomyelitis EXAM: MRI OF THE LEFT FOOT WITHOUT CONTRAST TECHNIQUE: Multiplanar, multisequence MR imaging of the left was performed. No intravenous contrast was administered. COMPARISON: Radiograph same day FINDINGS: Bones/Joint/Cartilage Small cystic changes are seen at the fifth metatarsal head, likely degenerative changes. No marrow edema, osseous fracture, or periosteal reaction are seen throughout the osseous structures. Small joint effusion seen at the fifth MTP joint. There appears to be chronic cortical irregularity with erosive type change seen at the fourth metatarsal head which is unchanged since June 28, 2019, however new since 2019. Ligaments The Lisfranc ligaments and collateral ligaments appear to be intact. Muscles and Tendons Diffusely increased signal seen throughout the muscles surrounding the forefoot with fatty atrophy. The flexor and extensor tendons appear to be intact. The plantar fascia is intact. Soft tissues Along the dorsal lateral aspect of the fifth metatarsal shaft there is a focal area superficial ulceration measuring approximately 8 mm in transverse dimension. There is overlying skin thickening and subcutaneous edema seen. No sinus tract or loculated fluid collection is noted. IMPRESSION: 1. Small focal area of ulceration along the dorsal lateral aspect of the fifth metatarsal shaft. No definite evidence of acute osteomyelitis, sinus tract, or abscess. 2. Progressive chronic erosive cortical destruction seen at the fourth metatarsal head since 2019, however unchanged since June 28, 2019, which could be from chronic osteomyelitis or  prior injury. 3. Small joint effusion at the fifth MTP joint. Electronically Signed By: Prudencio Pair M.D. On: 08/02/2019 42:55 55 year old type I diabetic who we had in clinic in late 2019 to February 2020 with osteomyelitis of his  left fourth metatarsal head and a wound in this area. This eventually healed with IV antibiotics and hyperbaric oxygen. He was relatively well until February of this year when he developed I believe 2 wounds on the left fifth met head 1 plantar and 1 more laterally. The one more laterally has since closed over but he has been left with a fairly persistent wound in the plantar left fifth met head. Has been following with [name] of podiatry. For an extended period of time he used Medihoney. He received doxycycline and April at which time a culture showed E. coli and MSSA. An MRI done on August 02, 2019 [shown above] did not show osteomyelitis but did suggest some ongoing destruction at the fourth metatarsal head. He was switched to silver alginate as the primary dressing in May. Earlier this month he received a 2-week course of Bactrim due to erythema around the wound. He was referred to Dr. Alfred Levins of infectious disease who did not think anything was infected and recommended stopping antibiotics. The patient said he felt really well has you finished up the Bactrim but then yesterday morning he became febrile again and noted more erythema around the wound. He was referred here for our review of this. His temperature today is 99.3. He denies any other infectious symptoms no cough no upper respiratory, no dysuria etc. He has been using silver alginate and offloading this area with a surgical shoe. 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 He apparently had shoulder surgery in the fall 2020 I did not look this up he apparently has hardware in this area. Recent x-ray done in Dr. Pasty Arch office on 6/7 was negative for osteo- ABIs have always been noncompressible. Is not previously been thought that arterial insufficiency is contributing to this. 6/29; patient  returns the clinic. I thought he might be going to see Dr. Earleen Newport ongoing for this wound but he is back today. He did see Dr. Earleen Newport on 11/22/2019 I believe he agreed with the MRI and apparently was making some arrangements to have this done in [facility] which I am fine with if they can compare with the study of March 2. In any case he returns with a clinic of the wound is not as good. He has been using silver alginate. He is still on another antibiotic that he says Dr. Earleen Newport prescribed 7/8; the patient had his MRI which showed a progressive soft tissue ulceration plantar to the fifth MTP joint with increased soft tissue enhancement throughout the small toe no abscess was noted. There was progressive signal changes in the fifth metatarsal head and fifth proximal phalanx without cortical destruction or definite abnormal T1 signal. These findings are nonspecific could be secondary to hyperemia or early osteomyelitis. He is on amoxicillin as directed by Dr. Earleen Newport. There were plans to do a bone biopsy and culture done in the OR on Friday however the patient canceled this because of insurance issues [surgical center not in [place]. In any case I have told him that this procedure in my opinion needs to be done. He is using Silvadene cream READMISSION 02/07/2020 This is a patient who had  a progressive ulceration over the fifth MTP. We had him here in June and into the early July. He was also followed by [name] of podiatry. The day after we last saw him on 7/9 he went underwent a excision of the fifth MTP to aid in wound healing over this area. Pathology did not show osteomyelitis. Since then he has been following with Dr. Earleen Newport. Notable that earlier this month he presented with worse wound with purulence. CandS showed MSSA and he was treated with doxycycline and Silvadene cream. More recently he has been changed to Augmentin. He is using Betadine to the wound. He has a forefoot offloading shoe at  some point in the postoperative. This wound opened in the surgical line. He is here for our review of this. With regards to his diabetes I spent some time on this today. This was diagnosed in his mid 27s he was on pills for a while and then on insulin. When he was here last time he told me that he thought he had had antipancreatic antibodies that showed he was type I if so I do not see this. He followed for a period of time with Dr. Meredith Pel endocrinology at [facility] who I gather is currently independent practice in [facility]. I have looked through her notes as it applies to what is available in care everywhere and she consistently calls him a type II diabetic. If he had other studies of his diabetes I do not see this. I have therefore changed my label of him to type II ABI in our clinic was 1.41 on the left 9/13; arrives in clinic today with things not looking as good. He has a small linear wound in the incision line but nonviable rolled edges and debris around the circumference. As well it looks as though he had a deep tissue injury over the base of the fifth metatarsal. Raised may be blistered but no open area here. Also concerning erythema around the wound both distally and proximally 9/20; PCR culture I did last time showed high Enterococcus faecalis, medium MRSA and 3 different anaerobic's including medium Peptostreptococcus magnus. I gave him a combination of Augmentin and trimethoprim sulfamethoxazole starting on 9/16 for 10 days. He is tolerating these well He comes in today. His original wound area at the surgical site only has a small crevice we are using endoform here. Not surprisingly the area over the base of the fifth metatarsal has open with a superficial wound. He has a wrap/surgical shoe injury dorsally on the left foot he is asking about a total contact cast and I think what I know about this area suggests that the lateral part of his foot is a weightbearing surface and I think we  could certainly justify this. 9/27; patient is finishing his antibiotics. His surgical wound is actually closed the area over the base of the left fifth metatarsal of is also closed but still with some eschar. 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 As such he actually walks on the outside of his foot. He has been in touch with [facility] Liliane Channel is their orthotic specialist READMISSION 08/20/2020 This is a 55 year old man who is a type II diabetic. 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 He has been in this clinic before with wounds over his left fifth and left fourth met head. He has a history of osteomyelitis in  the left fifth met head requiring an excision of the fifth MTP.. The patient tells [place] that he has an AFO brace now to correct the ankle deformity. Sometime in February he developed a callus over the fourth metatarsal head ultimately Dr. Earleen Newport open this area to expose the wound. He used Silvadene cream for a while most recently Promogran. He has had antibiotics as well including doxycycline and apparently currently Augmentin although I am not certain there is been any cultures. He states the area has not been x-rayed. 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 The met head here is subluxed. His x-ray that we ordered last week was negative for osteomyelitis. Clinically there does not appear to be infection here. No erythema no drainage the wound does not probe to bone 4/4; put a total contact cast on him last week primary dressing of silver collagen he arrives in clinic today with a wound measuring larger and as opposed to last week a direct probe to bone. He is  definitely going to need an MRI 4/11; miraculously the divot the probe to bone his filled in this week. Unfortunately his MRI is not till next Monday. We have been using silver collagen under a total contact cast. He has completed his antibiotics 4/18; no palpable bone. The distal half of this wound is has depth but there is no exposed bone. More proximal is at the level of the surrounding skin. His MRI is being delayed because of insurance issues was supposed to be today then Wednesday and now we are not exactly sure what the date is or what the issue is. In general things look somewhat better. We have been using silver collagen under the total contact cast 4/27; patient presents for removal and placement of TCC. He has tolerated the cast well in the past week. He has no complaints today. This MRI is still pending scheduling. We will not use a silver dressing so in case it does get scheduled he can get this done without issue. Electronic Signature(s) Signed: 09/26/2020 8:47:46 AM By: Kalman Shan DO Entered By: Kalman Shan on 09/26/2020 08:29:23 -------------------------------------------------------------------------------- Physical Exam Details Patient Name: Date of Service: [facility] [name] 6/75/4492 7:30 A M Medical Record Number: [id number] Patient Account Number: [id number] Date of Birth/Sex: Treating RN: 1966/01/18 (55 y.o. Ernestene Mention Primary Care Provider: [name] Other Clinician: Referring Provider: Treating Provider/Extender: Warren Danes, [name] in Treatment: 5 Constitutional respirations regular, non-labored and within target range for patient.. Cardiovascular 2+ dorsalis pedis/posterior tibialis pulses. Psychiatric pleasant and cooperative. Notes Left foot: Left fourth metatarsal head has a small wound opening with circumferential callus. Sloughing at the base. No exposed bone and no signs of infection. Electronic  Signature(s) Signed: 09/26/2020 8:47:46 AM By: Kalman Shan DO Entered By: Kalman Shan on 09/26/2020 08:29:40 -------------------------------------------------------------------------------- Physician Orders Details Patient Name: Date of Service: [facility] [place] 7/58/8325 7:30 A M Medical Record Number: [id number] Patient Account Number: [id number] Date of Birth/Sex: Treating RN: [birth year] (55 y.o. Ernestene Mention Primary Care Provider: [name] Other Clinician: Referring Provider: Treating Provider/Extender: Warren Danes, [name] in Treatment: 5 Verbal / Phone Orders: No Diagnosis Coding ICD-10 Coding Code Description E11.621 Type 2 diabetes mellitus with foot ulcer L97.528 Non-pressure chronic ulcer of  other part of left foot with other specified severity Follow-up Appointments ppointment in 1 week. - Monday with Dr. Dellia Nims Return A Bathing/ Shower/ Hygiene May shower with protection but do not get wound dressing(s) wet. Off-Loading Total Contact Cast to Left Lower Extremity Wound Treatment Wound #6 - Metatarsal head fourth Wound Laterality: Left Cleanser: Wound Cleanser 1 x Per Week Discharge Instructions: Cleanse the wound with wound cleanser prior to applying a clean dressing using gauze sponges, not tissue or cotton balls. Prim Dressing: Maxorb Extra Calcium Alginate 2x2 in 1 x Per Week ary Discharge Instructions: Apply calcium alginate to wound bed as instructed Secondary Dressing: Woven Gauze Sponges 2x2 in (Generic) 1 x Per Week Discharge Instructions: Apply over primary dressing as directed. Secondary Dressing: Optifoam Non-Adhesive Dressing, 4x4 in 1 x Per Week Discharge Instructions: Apply over primary dressing cut to make foam donut Secured With: 80M Medipore H Soft Cloth Surgical T 4 x 2 (in/yd) 1 x Per Week ape Discharge Instructions: Secure dressing with tape as directed. Electronic Signature(s) Signed: 09/26/2020  8:47:46 AM By: Kalman Shan DO Entered By: Kalman Shan on 09/26/2020 08:29:50 -------------------------------------------------------------------------------- Problem List Details Patient Name: Date of Service: [facility], [place] 3/53/6144 7:30 A M Medical Record Number: [id number] Patient Account Number: [id number] Date of Birth/Sex: Treating RN: 20-Nov-1965 (55 y.o. Ernestene Mention Primary Care Provider: [name] Other Clinician: Referring Provider: Treating Provider/Extender: Warren Danes, [name] in Treatment: 5 Active Problems ICD-10 Encounter Code Description Active Date MDM Diagnosis E11.621 Type 2 diabetes mellitus with foot ulcer 08/20/2020 No Yes L97.528 Non-pressure chronic ulcer of other part of left foot with other specified 08/20/2020 No Yes severity Inactive Problems Resolved Problems Electronic Signature(s) Signed: 09/26/2020 8:47:46 AM By: Kalman Shan DO Entered By: Kalman Shan on 09/26/2020 08:23:46 -------------------------------------------------------------------------------- Progress Note Details Patient Name: Date of Service: [facility], [place] 11/19/5091 7:30 A M Medical Record Number: [id number] Patient Account Number: [id number] Date of Birth/Sex: Treating RN: [birth year] (55 y.o. Ernestene Mention Primary Care Provider: [name] Other Clinician: Referring Provider: Treating Provider/Extender: Warren Danes, [name] in Treatment: 5 Subjective Chief Complaint Information obtained from Patient 05/04/2018; patient is here for review of a wound over the left plantar fourth metatarsal head referred by [name] of podiatric surgery. 11/22/2019; patient is here for review of the wound on the plantar left fifth metatarsal head. She was referred by [name] 08/20/2020; patient is here for review of wound on the plantar fourth met head History of Present Illness  (HPI) ADMISSION 05/04/2018 This is a 55 year old man who apparently is a type I diabetic confirmed by appropriate serology testing 6 years ago. He is therefore on insulin. His most recent hemoglobin A1c was 10.7 on 03/27/2018. 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 As a result of a combination of this he is insensate and even has reduced sensation in his upper extremities. His current problem started in early July he noted a blister over the left fourth plantar metatarsal head. He saw [name] his podiatrist on 01/07/2018. At that time he had a wound that measured 2 x 1 x 0.4 cm. An x-ray did not show osteomyelitis. It did not appear that he actually followed up. He was admitted to hospital on 03/25/2018 through 03/30/2018 with his left foot painful and swollen. He underwent a surgical IandD by [name].  I believe his culture at that time showed Enterococcus faecalis and a bone culture showed enterococcus avium. He is been reviewed by [name] of infectious disease although I have not reviewed his note and he has been on 6 weeks worth of ertapenem which should finish sometime apparently on December 9.. MRI suggested possibility of septic arthritis but did not comment specifically on osteomyelitis. A more recent wound culture was negative. The patient has been referred here for consideration among other things of hyperbaric oxygen versus proceeding with a partial ray amputation. This would involve excision of the fourth metatarsal head. The patient is offloading this and a Darco forefoot off loader. I think he is using iodoform packing. 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 Certainly would be a possibility of a total contact cast. I did not see any arterial studies on him however his peripheral pulses are  palpable 05/11/2018; patient started his hyperbarics on Friday but did not dive yesterday for personal issues. He saw [name] on 12//19 he notes that during the hospitalization in October 2019 he had an abscess and underwent a bone biopsy with culture that showed group B strep, enterococcus avium and Enterococcus faecalis. There was Prevotella from the abscess. He was put on ertapenem for 6 weeks which he completed. Per [name] notable for the fact that he is not been treated with ampicillin or amoxicillin. His hemoglobin A1c was noted to be high at 10.7. He is started on ampicillin sulbactam every 6 again I think this is for better coverage for the enterococcus. We started him on silver alginate as there is still purulent looking drainage coming out of the wound and there is indeed exposed bone. The patient has not been unwell 05/18/2018; patient continues with hyperbarics he is tolerating this well and has no complaints. He is on Unasyn every 6. We have been using silver alginate to the area over the fourth plantar metatarsal head. He is a poorly controlled diabetic. There is certainly less purulent drainage than when I first saw this man at the beginning of the month. He only sedimentation rate I see on him was 40 on 03/26/2018. States his blood sugars are under better control in the 120s to 130 range. Follows with his endocrinologist in [facility] I change his primary dressing to Prisma today attempting to increase the granulation. After I left the room he called [place] back into look at a area that he noticed on the right foot 2 days ago. Very superficial and in the medial aspect of his high plantar arch [secondary to his neuropathy] he has a custom insert in the shoe on the right foot. 05/24/2018; continues with HBO doing well. He saw infectious disease today and is still on Unasyn every 6 hours. Not making a lot of progress on the left foot with Prisma I changed him to silver alginate  strips. The new area on the right midfoot he is putting silver alginate on this as well. 06/01/2018; patient remains on Unasyn. Not making a lot of progress with granulation. I am going to put him in a total contact cast use Prisma for now. He has a high co-pay for an advanced treatment option but I suspect feeling in this wound may come to that 06/04/2018; patient back for the obligatory first total contact cast change. We continue to use Prisma 06/08/2018; the patient is tolerating the cast well although he had some loosening over the tibia which rubbed when he walked.  There is no lesion here. We have been using Prisma under the total contact cast. He is in hyperbarics which she is tolerating well 06/15/2018; tolerating total contact cast and hyperbarics well. He continues IV antibiotics and for the underlying osteomyelitis as directed by infectious disease. His wound is making really nice progress 1/21; Patient's wound is down in depth. Using silver collagen. Ab's end this week and Pic line is to be d/ced 1/28; 0.8 cm in depth which is more than last time. We have been using silver collagen under total contact cast. He continues in hyperbarics. His IV antibiotics are finished PICC line has been removed 2/4; surprisingly today the area is actually epithelialized. There is no open area. There is however a divot where the epithelium is gone down to close the wound surface. I am not completely convinced that this is going to remain viable over time but I think time is the best thing to determine this. There is no evidence of surrounding infection. As of today I do not think a repeat MRI or any other procedure is going to be indicative of whether this is going to maintain a closed state. I am going to continue him in a total contact cast. He will complete HBO. Next week I will allow him perhaps to graduate into a Darco forefoot off loader, then a healing sandal. I will then send him back to podiatry to see  if they can fashion an offloading shoe if this area remains closed 2/10; the patient was taken out of the cast today the area is healed. He has a divot however this is fully epithelialized and the epithelialized tissue looks healthy. I have put him into a Darco forefoot off loader. He will complete his last 2 hyperbaric treatments. I will see him next week and I may transition him into a surgical shoe versus his own shoe. He is following up with [name] of podiatry and I think a diabetic shoe with custom inserts could be provided with the signature provided by his endocrinologist ADMISSION 6/22 CLINICAL DATA: Foot ulcer, question of osteomyelitis EXAM: MRI OF THE LEFT FOOT WITHOUT CONTRAST TECHNIQUE: Multiplanar, multisequence MR imaging of the left was performed. No intravenous contrast was administered. COMPARISON: Radiograph same day FINDINGS: Bones/Joint/Cartilage Small cystic changes are seen at the fifth metatarsal head, likely degenerative changes. No marrow edema, osseous fracture, or periosteal reaction are seen throughout the osseous structures. Small joint effusion seen at the fifth MTP joint. There appears to be chronic cortical irregularity with erosive type change seen at the fourth metatarsal head which is unchanged since June 28, 2019, however new since 2019. Ligaments The Lisfranc ligaments and collateral ligaments appear to be intact. Muscles and Tendons Diffusely increased signal seen throughout the muscles surrounding the forefoot with fatty atrophy. The flexor and extensor tendons appear to be intact. The plantar fascia is intact. Soft tissues Along the dorsal lateral aspect of the fifth metatarsal shaft there is a focal area superficial ulceration measuring approximately 8 mm in transverse dimension. There is overlying skin thickening and subcutaneous edema seen. No sinus tract or loculated fluid collection is noted. IMPRESSION: 1. Small focal area of  ulceration along the dorsal lateral aspect of the fifth metatarsal shaft. No definite evidence of acute osteomyelitis, sinus tract, or abscess. 2. Progressive chronic erosive cortical destruction seen at the fourth metatarsal head since 2019, however unchanged since June 28, 2019, which could be from chronic osteomyelitis or prior injury. 3. Small joint effusion at the  fifth MTP joint. Electronically Signed By: Prudencio Pair M.D. On: 08/02/2019 21:67 55 year old type I diabetic who we had in clinic in late 2019 to February 2020 with osteomyelitis of his left fourth metatarsal head and a wound in this area. This eventually healed with IV antibiotics and hyperbaric oxygen. He was relatively well until February of this year when he developed I believe 2 wounds on the left fifth met head 1 plantar and 1 more laterally. The one more laterally has since closed over but he has been left with a fairly persistent wound in the plantar left fifth met head. Has been following with [name] of podiatry. For an extended period of time he used Medihoney. He received doxycycline and April at which time a culture showed E. coli and MSSA. An MRI done on August 02, 2019 [shown above] did not show osteomyelitis but did suggest some ongoing destruction at the fourth metatarsal head. He was switched to silver alginate as the primary dressing in May. Earlier this month he received a 2-week course of Bactrim due to erythema around the wound. He was referred to Dr. Alfred Levins of infectious disease who did not think anything was infected and recommended stopping antibiotics. The patient said he felt really well has you finished up the Bactrim but then yesterday morning he became febrile again and noted more erythema around the wound. He was referred here for our review of this. His temperature today is 99.3. He denies any other infectious symptoms no cough no upper respiratory, no dysuria etc. He has been using silver  alginate and offloading this area with a surgical shoe. 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 He apparently had shoulder surgery in the fall 2020 I did not look this up he apparently has hardware in this area. Recent x-ray done in Dr. Pasty Arch office on 6/7 was negative for osteo- ABIs have always been noncompressible. Is not previously been thought that arterial insufficiency is contributing to this. 6/29; patient returns the clinic. I thought he might be going to see Dr. Earleen Newport ongoing for this wound but he is back today. He did see Dr. Earleen Newport on 11/22/2019 I believe he agreed with the MRI and apparently was making some arrangements to have this done in [facility] which I am fine with if they can compare with the study of March 2. In any case he returns with a clinic of the wound is not as good. He has been using silver alginate. He is still on another antibiotic that he says Dr. Earleen Newport prescribed 7/8; the patient had his MRI which showed a progressive soft tissue ulceration plantar to the fifth MTP joint with increased soft tissue enhancement throughout the small toe no abscess was noted. There was progressive signal changes in the fifth metatarsal head and fifth proximal phalanx without cortical destruction or definite abnormal T1 signal. These findings are nonspecific could be secondary to hyperemia or early osteomyelitis. He is on amoxicillin as directed by Dr. Earleen Newport. There were plans to do a bone biopsy and culture done in the OR on Friday however the patient canceled this because of insurance issues [surgical center not in [place]. In any case I have told him that this procedure in my opinion needs to be done. He is using Silvadene cream READMISSION 02/07/2020 This is a patient who had a progressive ulceration over the fifth MTP. We had  him  here in June and into the early July. He was also followed by [name] of podiatry. The day after we last saw him on 7/9 he went underwent a excision of the fifth MTP to aid in wound healing over this area. Pathology did not show osteomyelitis. Since then he has been following with Dr. Earleen Newport. Notable that earlier this month he presented with worse wound with purulence. CandS showed MSSA and he was treated with doxycycline and Silvadene cream. More recently he has been changed to Augmentin. He is using Betadine to the wound. He has a forefoot offloading shoe at some point in the postoperative. This wound opened in the surgical line. He is here for our review of this. With regards to his diabetes I spent some time on this today. This was diagnosed in his mid 73s he was on pills for a while and then on insulin. When he was here last time he told me that he thought he had had antipancreatic antibodies that showed he was type I if so I do not see this. He followed for a period of time with Dr. Meredith Pel endocrinology at [facility] who I gather is currently independent practice in [facility]. I have looked through her notes as it applies to what is available in care everywhere and she consistently calls him a type II diabetic. If he had other studies of his diabetes I do not see this. I have therefore changed my label of him to type II ABI in our clinic was 1.41 on the left 9/13; arrives in clinic today with things not looking as good. He has a small linear wound in the incision line but nonviable rolled edges and debris around the circumference. As well it looks as though he had a deep tissue injury over the base of the fifth metatarsal. Raised may be blistered but no open area here. Also concerning erythema around the wound both distally and proximally 9/20; PCR culture I did last time showed high Enterococcus faecalis, medium MRSA and 3 different anaerobic's including medium Peptostreptococcus magnus. I  gave him a combination of Augmentin and trimethoprim sulfamethoxazole starting on 9/16 for 10 days. He is tolerating these well He comes in today. His original wound area at the surgical site only has a small crevice we are using endoform here. Not surprisingly the area over the base of the fifth metatarsal has open with a superficial wound. He has a wrap/surgical shoe injury dorsally on the left foot he is asking about a total contact cast and I think what I know about this area suggests that the lateral part of his foot is a weightbearing surface and I think we could certainly justify this. 9/27; patient is finishing his antibiotics. His surgical wound is actually closed the area over the base of the left fifth metatarsal of is also closed but still with some eschar. 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 As such he actually walks on the outside of his foot. He has been in touch with [facility] Liliane Channel is their orthotic specialist READMISSION 08/20/2020 This is a 55 year old man who is a type II diabetic. 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 He has been in this clinic before with wounds over his left fifth and left fourth met head. He has a history of osteomyelitis in the left fifth met head requiring an excision of  the fifth MTP.. The patient tells [place] that he has an AFO brace now to correct the ankle deformity. Sometime in February he developed a callus over the fourth metatarsal head ultimately Dr. Earleen Newport open this area to expose the wound. He used Silvadene cream for a while most recently Promogran. He has had antibiotics as well including doxycycline and apparently currently Augmentin although I am not certain there is been any cultures. He states the area has not been x-rayed. 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 The met head here is subluxed. His x-ray that we ordered last week was negative for osteomyelitis. Clinically there does not appear to be infection here. No erythema no drainage the wound does not probe to bone 4/4; put a total contact cast on him last week primary dressing of silver collagen he arrives in clinic today with a wound measuring larger and as opposed to last week a direct probe to bone. He is definitely going to need an MRI 4/11; miraculously the divot the probe to bone his filled in this week. Unfortunately his MRI is not till next Monday. We have been using silver collagen under a total contact cast. He has completed his antibiotics 4/18; no palpable bone. The distal half of this wound is has depth but there is no exposed bone. More proximal is at the level of the surrounding skin. His MRI is being delayed because of insurance issues was supposed to be today then Wednesday and now we are not exactly sure what the date is or what the issue is. In general things look somewhat better. We have been using silver collagen under the total contact cast 4/27; patient presents for removal and placement of TCC. He has tolerated the cast well in the past week. He has no complaints today. This MRI is still pending scheduling. We will not use a silver dressing so in case it does get scheduled he can get this done without issue. Patient History Information obtained from Patient. Family History Cancer - Mother, Diabetes - Maternal Grandparents,Father, Heart Disease - Father, No family history of Hereditary Spherocytosis, Hypertension, Kidney Disease, Lung Disease, Seizures, Stroke, Thyroid Problems, Tuberculosis. Social History Former smoker - quit 30 years ago, Marital Status - Divorced, Alcohol Use - Rarely, Drug Use - No History, Caffeine Use -  Daily - coffee. Medical History Eyes Patient has history of Cataracts - right eye removed Denies history of Glaucoma, Optic Neuritis Ear/Nose/Mouth/Throat Denies history of Chronic sinus problems/congestion, Middle ear problems Hematologic/Lymphatic Denies history of Anemia, Hemophilia, Human Immunodeficiency Virus, Lymphedema, Sickle Cell Disease Respiratory Denies history of Aspiration, Asthma, Chronic Obstructive Pulmonary Disease (COPD), Pneumothorax, Sleep Apnea, Tuberculosis Cardiovascular Patient has history of Hypertension Denies history of Angina, Arrhythmia, Congestive Heart Failure, Coronary Artery Disease, Deep Vein Thrombosis, Hypotension, Myocardial Infarction, Peripheral Arterial Disease, Peripheral Venous Disease, Phlebitis, Vasculitis Gastrointestinal Denies history of Cirrhosis , Colitis, Crohnoos, Hepatitis A, Hepatitis B, Hepatitis C Endocrine Patient has history of Type II Diabetes Denies history of Type I Diabetes Genitourinary Denies history of End Stage Renal Disease Immunological Denies history of Lupus Erythematosus, Raynaudoos, Scleroderma Integumentary (Skin) Denies history of History of Burn Musculoskeletal Patient has history of Osteomyelitis - left foot Denies history of Gout, Rheumatoid Arthritis, Osteoarthritis Neurologic Patient has history of Neuropathy Denies history  of Dementia, Quadriplegia, Paraplegia, Seizure Disorder Oncologic Denies history of Received Chemotherapy, Received Radiation Psychiatric Denies history of Anorexia/bulimia, Confinement Anxiety Hospitalization/Surgery History - IandD with bone biopsy. - rhinoplasty. - bone spur removed right elbow. - cervical C4-C5 sx. - right shoulder surgery. - left hand surgery. Medical A Surgical History Notes nd Eyes detach retina left eye in 2005 resulting in wearing a contact. Cardiovascular hyperlipidemia Gastrointestinal hx pancreatitis, hx stomach ulcer,  esophagitis Neurologic 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 Psychiatric ADHD Objective Constitutional respirations regular, non-labored and within target range for patient.. Vitals Time Taken: 7:57 AM, Height: 76 in, Weight: 260 lbs, BMI: 31.6, Temperature: 98.7 F, Pulse: 71 bpm, Respiratory Rate: 16 breaths/min, Blood Pressure: 156/80 mmHg. Cardiovascular 2+ dorsalis pedis/posterior tibialis pulses. Psychiatric pleasant and cooperative. General Notes: Left foot: Left fourth metatarsal head has a small wound opening with circumferential callus. Sloughing at the base. No exposed bone and no signs of infection. Integumentary (Hair, Skin) Wound #6 status is Open. Original cause of wound was Gradually Appeared. The date acquired was: 07/03/2020. The wound has been in treatment 5 weeks. The wound is located on the Left Metatarsal head fourth. The wound measures 0.5cm length x 0.6cm width x 0.3cm depth; 0.236cm^2 area and 0.071cm^3 volume. There is Fat Layer (Subcutaneous Tissue) exposed. There is no tunneling or undermining noted. There is a medium amount of purulent drainage noted. The wound margin is well defined and not attached to the wound base. There is large (67-100%) pink, pale granulation within the wound bed. There is a small (1-33%) amount of necrotic tissue within the wound bed including Adherent Slough. General Notes: Slight maceration, calloused periwound Assessment Active Problems ICD-10 Type 2 diabetes mellitus with foot ulcer Non-pressure chronic ulcer of other part of left foot with other specified severity Patient presents for total contact cast replacement. He is doing well. Wound has improved in size and appearance. This was debrided today. He will follow-up in 1 week. Procedures Wound #6 Pre-procedure diagnosis of Wound #6 is a Diabetic Wound/Ulcer of the Lower Extremity located on the Left Metatarsal head fourth .Severity of Tissue Pre Debridement is: Fat layer  exposed. There was a Excisional Skin/Subcutaneous Tissue Debridement with a total area of 1 sq cm performed by Kalman Shan, DO. With the following instrument(s): Curette to remove Viable and Non-Viable tissue/material. Material removed includes Callus, Subcutaneous Tissue, Slough, and Skin: Epidermis after achieving pain control using Other (benzocaine 20% spray). No specimens were taken. A time out was conducted at 08:15, prior to the start of the procedure. A Minimum amount of bleeding was controlled with Pressure. The procedure was tolerated well with a pain level of 0 throughout and a pain level of 0 following the procedure. Post Debridement Measurements: 0.5cm length x 0.4cm width x 0.3cm depth; 0.047cm^3 volume. Character of Wound/Ulcer Post Debridement is improved. Severity of Tissue Post Debridement is: Fat layer exposed. Post procedure Diagnosis Wound #6: Same as Pre-Procedure Pre-procedure diagnosis of Wound #6 is a Diabetic Wound/Ulcer of the Lower Extremity located on the Left Metatarsal head fourth . There was a T Interior and spatial designer Procedure by Kalman Shan, DO. Post procedure Diagnosis Wound #6: Same as Pre-Procedure Plan Follow-up Appointments: Return Appointment in 1 week. - Monday with Dr. Arcola Jansky Shower/ Hygiene: May shower with protection but do not get wound dressing(s) wet. Off-Loading: T Contact Cast to Left Lower Extremity otal WOUND #6: - Metatarsal head fourth Wound Laterality: Left Cleanser: Wound Cleanser 1 x Per Week/  Discharge Instructions: Cleanse the wound with wound cleanser prior to applying a clean dressing using gauze sponges, not tissue or cotton balls. Prim Dressing: Maxorb Extra Calcium Alginate 2x2 in 1 x Per Week/ ary Discharge Instructions: Apply calcium alginate to wound bed as instructed Secondary Dressing: Woven Gauze Sponges 2x2 in (Generic) 1 x Per Week/ Discharge Instructions: Apply over primary dressing as directed. Secondary  Dressing: Optifoam Non-Adhesive Dressing, 4x4 in 1 x Per Week/ Discharge Instructions: Apply over primary dressing cut to make foam donut Secured With: 16M Medipore H Soft Cloth Surgical T 4 x 2 (in/yd) 1 x Per Week/ ape Discharge Instructions: Secure dressing with tape as directed. 1. T contact cast otal 2. Calcium alginate 3. In office sharp debridement 4. Follow-up in 1 week Electronic Signature(s) Signed: 09/26/2020 8:47:46 AM By: Kalman Shan DO Entered By: Kalman Shan on 09/26/2020 08:46:19 -------------------------------------------------------------------------------- HxROS Details Patient Name: Date of Service: [facility], [place] 07/06/5595 7:30 A M Medical Record Number: [id number] Patient Account Number: [id number] Date of Birth/Sex: Treating RN: [birth year] (55 y.o. Ernestene Mention Primary Care Provider: [name] Other Clinician: Referring Provider: Treating Provider/Extender: Warren Danes, [name] in Treatment: 5 Information Obtained From Patient Eyes Medical History: Positive for: Cataracts - right eye removed Negative for: Glaucoma; Optic Neuritis Past Medical History Notes: detach retina left eye in 2005 resulting in wearing a contact. Ear/Nose/Mouth/Throat Medical History: Negative for: Chronic sinus problems/congestion; Middle ear problems Hematologic/Lymphatic Medical History: Negative for: Anemia; Hemophilia; Human Immunodeficiency Virus; Lymphedema; Sickle Cell Disease Respiratory Medical History: Negative for: Aspiration; Asthma; Chronic Obstructive Pulmonary Disease (COPD); Pneumothorax; Sleep Apnea; Tuberculosis Cardiovascular Medical History: Positive for: Hypertension Negative for: Angina; Arrhythmia; Congestive Heart Failure; Coronary Artery Disease; Deep Vein Thrombosis; Hypotension; Myocardial Infarction; Peripheral Arterial Disease; Peripheral Venous Disease; Phlebitis; Vasculitis Past Medical History  Notes: hyperlipidemia Gastrointestinal Medical History: Negative for: Cirrhosis ; Colitis; Crohns; Hepatitis A; Hepatitis B; Hepatitis C Past Medical History Notes: hx pancreatitis, hx stomach ulcer, esophagitis Endocrine Medical History: Positive for: Type II Diabetes Negative for: Type I Diabetes Time with diabetes: 9 years Treated with: Insulin Blood sugar tested every day: Yes Tested : 4-5 times per day Genitourinary Medical History: Negative for: End Stage Renal Disease Immunological Medical History: Negative for: Lupus Erythematosus; Raynauds; Scleroderma Integumentary (Skin) Medical History: Negative for: History of Burn Musculoskeletal Medical History: Positive for: Osteomyelitis - left foot Negative for: Gout; Rheumatoid Arthritis; Osteoarthritis Neurologic Medical History: Positive for: Neuropathy Negative for: Dementia; Quadriplegia; Paraplegia; Seizure Disorder Past Medical History Notes: 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 Oncologic Medical History: Negative for: Received Chemotherapy; Received Radiation Psychiatric Medical History: Negative for: Anorexia/bulimia; Confinement Anxiety Past Medical History Notes: ADHD HBO Extended History Items Eyes: Cataracts Immunizations Pneumococcal Vaccine: Received Pneumococcal Vaccination: No Implantable Devices None Hospitalization / Surgery History Type of Hospitalization/Surgery IandD with bone biopsy rhinoplasty bone spur removed right elbow cervical C4-C5 sx right shoulder surgery left hand surgery Family and Social History Cancer: Yes - Mother; Diabetes: Yes - Maternal Grandparents,Father; Heart Disease: Yes - Father; Hereditary Spherocytosis: No; Hypertension: No; Kidney Disease: No; Lung Disease: No; Seizures: No; Stroke: No; Thyroid Problems: No; Tuberculosis: No; Former smoker - quit 30 years ago; Marital Status - Divorced; Alcohol Use: Rarely; Drug Use: No History; Caffeine Use: Daily -  coffee; Financial Concerns: No; Food, Clothing or Shelter Needs: No; Support System Lacking: No; Transportation Concerns: No Electronic Signature(s) Signed: 09/26/2020 8:47:46 AM By: Kalman Shan DO Signed: 09/26/2020 5:51:39 PM By: Baruch Gouty  RN, BSN Entered By: Kalman Shan on 09/26/2020 08:29:29 -------------------------------------------------------------------------------- Total Contact Cast Details Patient Name: Date of Service: [facility], [place] 02/11/2582 7:30 A M Medical Record Number: [id number] Patient Account Number: [id number] Date of Birth/Sex: Treating RN: [birth year] (55 y.o. Ernestene Mention Primary Care Provider: [name] Other Clinician: Referring Provider: Treating Provider/Extender: Warren Danes, [name] in Treatment: 5 T Contact Cast Applied for Wound Assessment: otal Wound #6 Left Metatarsal head fourth Performed By: Physician Kalman Shan, DO Post Procedure Diagnosis Same as Pre-procedure Electronic Signature(s) Signed: 09/26/2020 8:47:46 AM By: Kalman Shan DO Signed: 09/26/2020 5:51:39 PM By: Baruch Gouty RN, BSN Entered By: Baruch Gouty on 09/26/2020 08:23:26 -------------------------------------------------------------------------------- SuperBill Details Patient Name: Date of Service: [facility], [place] 10/27/1290 Medical Record Number: [id number] Patient Account Number: [id number] Date of Birth/Sex: Treating RN: Oct 03, 1965 (55 y.o. Ernestene Mention Primary Care Provider: [name] Other Clinician: Referring Provider: Treating Provider/Extender: Warren Danes, [name] in Treatment: 5 Diagnosis Coding ICD-10 Codes Code Description [phone] Type 2 diabetes mellitus with foot ulcer L97.528 Non-pressure chronic ulcer of other part of left foot with other specified severity Facility Procedures CPT4 Code: 32419914 Description: 44584 - DEB SUBQ TISSUE 20 SQ CM/< ICD-10  Diagnosis Description L97.528 Non-pressure chronic ulcer of other part of left foot with other specified seve Modifier: rity Quantity: 1 Electronic Signature(s) Signed: 09/26/2020 8:47:46 AM By: Kalman Shan DO Signed: 09/26/2020 5:51:39 PM By: Baruch Gouty RN, BSN Entered By: Baruch Gouty on 09/26/2020 08:25:44

## 2020-10-01 ENCOUNTER — Encounter (HOSPITAL_BASED_OUTPATIENT_CLINIC_OR_DEPARTMENT_OTHER): Payer: BLUE CROSS/BLUE SHIELD | Attending: Internal Medicine | Admitting: Internal Medicine

## 2020-10-01 ENCOUNTER — Other Ambulatory Visit: Payer: Self-pay

## 2020-10-01 DIAGNOSIS — G600 Hereditary motor and sensory neuropathy: Secondary | ICD-10-CM | POA: Insufficient documentation

## 2020-10-01 DIAGNOSIS — E1042 Type 1 diabetes mellitus with diabetic polyneuropathy: Secondary | ICD-10-CM | POA: Insufficient documentation

## 2020-10-01 DIAGNOSIS — G6181 Chronic inflammatory demyelinating polyneuritis: Secondary | ICD-10-CM | POA: Insufficient documentation

## 2020-10-01 DIAGNOSIS — L97528 Non-pressure chronic ulcer of other part of left foot with other specified severity: Secondary | ICD-10-CM | POA: Insufficient documentation

## 2020-10-01 DIAGNOSIS — E10621 Type 1 diabetes mellitus with foot ulcer: Secondary | ICD-10-CM | POA: Insufficient documentation

## 2020-10-01 DIAGNOSIS — Z794 Long term (current) use of insulin: Secondary | ICD-10-CM | POA: Diagnosis not present

## 2020-10-01 DIAGNOSIS — L97529 Non-pressure chronic ulcer of other part of left foot with unspecified severity: Secondary | ICD-10-CM | POA: Diagnosis present

## 2020-10-01 NOTE — Progress Notes (Signed)
ERSKINE, STEINFELDT ([id number]) Visit Report for 10/01/2020 Arrival Information Details Patient Name: Date of Service: [facility], [place] 11/03/158 11:00 A M Medical Record Number: [id number] Patient Account Number: [id number] Date of Birth/Sex: Treating RN: [birth year] (55 y.o. [name] Primary Care Lafreda Casebeer: [name] Other Clinician: Referring Shlome Baldree: Treating Annalese Stiner/Extender: Elwyn Lade in Treatment: 6 Visit Information History Since Last Visit Added or deleted any medications: No Patient Arrived: Knee Scooter Any new allergies or adverse reactions: No Arrival Time: 11:14 Had a fall or experienced change in No Accompanied By: self activities of daily living that may affect Transfer Assistance: None risk of falls: Patient Identification Verified: Yes Signs or symptoms of abuse/neglect since last visito No Secondary Verification Process Completed: Yes Hospitalized since last visit: No Patient Requires Transmission-Based Precautions: No Implantable device outside of the clinic excluding No Patient Has Alerts: No cellular tissue based products placed in the center since last visit: Has Dressing in Place as Prescribed: Yes Pain Present Now: No Electronic Signature(s) Signed: 10/01/2020 5:00:30 PM By: Sandre Kitty Entered By: Sandre Kitty on 10/01/2020 11:15:17 -------------------------------------------------------------------------------- Lower Extremity Assessment Details Patient Name: Date of Service: [facility], [place] 08/02/2023 11:00 A M Medical Record Number: [id number] Patient Account Number: [id number] Date of Birth/Sex: Treating RN: Jan 14, 1966 (55 y.o. Burnadette Pop, Lauren Primary Care Nevan Creighton: [name] Other Clinician: Referring Rosaura Bolon: Treating Aryaan Persichetti/Extender: [name], [name] in Treatment: 6 Edema Assessment Assessed: Shirlyn Goltz: Yes] Patrice Paradise: No] Edema: [Left: N] [Right:  o] Calf Left: Right: Point of Measurement: 33 cm From Medial Instep 36 cm Ankle Left: Right: Point of Measurement: 8 cm From Medial Instep 22 cm Vascular Assessment Pulses: Dorsalis Pedis Palpable: [Left:Yes] Posterior Tibial Palpable: [Left:Yes] Electronic Signature(s) Signed: 10/01/2020 5:18:58 PM By: Rhae Hammock RN Entered By: Rhae Hammock on 10/01/2020 11:23:02 -------------------------------------------------------------------------------- Multi Wound Chart Details Patient Name: Date of Service: [facility] [place], [place] 07/10/3149 11:00 A M Medical Record Number: [id number] Patient Account Number: [id number] Date of Birth/Sex: Treating RN: Jun 09, 1965 (55 y.o. [name] Primary Care Ramsey Guadamuz: [name] Other Clinician: Referring Attilio Zeitler: Treating Suren Payne/Extender: [name], [name] in Treatment: 6 Vital Signs Height(in): 1 Pulse(bpm): 72 Weight(lbs): 260 Blood Pressure(mmHg): 129/76 Body Mass Index(BMI): 32 Temperature(F): 98.4 Respiratory Rate(breaths/min): 16 Photos: [6:No Photos Left Metatarsal head fourth] [N/A:N/A N/A] Wound Location: [6:Gradually Appeared] [N/A:N/A] Wounding Event: [6:Diabetic Wound/Ulcer of the Lower] [N/A:N/A] Primary Etiology: [6:Extremity Cataracts, Hypertension, Type II] [N/A:N/A] Comorbid History: [6:Diabetes, Osteomyelitis, Neuropathy 07/03/2020] [N/A:N/A] Date Acquired: [6:6] [N/A:N/A] Weeks of Treatment: [6:Open] [N/A:N/A] Wound Status: [6:0.2x0.3x0.6] [N/A:N/A] Measurements L x W x D (cm) [6:0.047] [N/A:N/A] A (cm) : rea [6:0.028] [N/A:N/A] Volume (cm) : [6:96.70%] [N/A:N/A] % Reduction in A [6:rea: 95.00%] [N/A:N/A] % Reduction in Volume: [6:12] Starting Position 1 (o'clock): [6:12] Ending Position 1 (o'clock): [6:0.6] Maximum Distance 1 (cm): [6:Yes] [N/A:N/A] Undermining: [6:Grade 1] [N/A:N/A] Classification: [6:Medium] [N/A:N/A] Exudate A mount: [6:Purulent] [N/A:N/A] Exudate  Type: [6:yellow, brown, green] [N/A:N/A] Exudate Color: [6:Well defined, not attached] [N/A:N/A] Wound Margin: [6:Large (67-100%)] [N/A:N/A] Granulation A mount: [6:Pink, Pale] [N/A:N/A] Granulation Quality: [6:Small (1-33%)] [N/A:N/A] Necrotic A mount: [6:Fat Layer (Subcutaneous Tissue): Yes N/A] Exposed Structures: [6:Fascia: No Tendon: No Muscle: No Joint: No Bone: No Medium (34-66%)] [N/A:N/A] Epithelialization: [6:Debridement - Selective/Open Wound N/A] Debridement: Pre-procedure Verification/Time Out 12:02 [N/A:N/A] Taken: [6:Callus] [N/A:N/A] Tissue Debrided: [6:Skin/Epidermis] [N/A:N/A] Level: [6:0.06] [N/A:N/A] Debridement A (sq cm): [6:rea Blade, Forceps] [N/A:N/A] Instrument: [6:Minimum] [N/A:N/A] Bleeding: [6:Pressure] [N/A:N/A] Hemostasis A chieved: [6:0] [N/A:N/A] Procedural Pain: [6:0] [  N/A:N/A] Post Procedural Pain: [6:Procedure was tolerated well] [N/A:N/A] Debridement Treatment Response: Post Debridement Measurements L x 0.2x0.3x0.6 [N/A:N/A] W x D (cm) [6:0.028] [N/A:N/A] Post Debridement Volume: (cm) [6:Debridement] [N/A:N/A] Procedures Performed: [6:T Contact Cast otal] Treatment Notes Electronic Signature(s) Signed: 10/01/2020 5:25:01 PM By: [name] MD Signed: 10/01/2020 5:39:21 PM By: [name] RN, BSN Entered By: [name] on 10/01/2020 12:37:00 -------------------------------------------------------------------------------- Multi-Disciplinary Care Plan Details Patient Name: Date of Service: [facility] [place] 12/05/8113 11:00 A M Medical Record Number: [id number] Patient Account Number: [id number] Date of Birth/Sex: Treating RN: Jun 02, 1966 (55 y.o. [name] Primary Care Guillermo Difrancesco: [name] Other Clinician: Referring Aamna Mallozzi: Treating Kamaury Cutbirth/Extender: Elwyn Lade in Treatment: 6 Multidisciplinary Care Plan reviewed with physician Active Inactive Nutrition Nursing Diagnoses: Impaired  glucose control: actual or potential Potential for alteratiion in Nutrition/Potential for imbalanced nutrition Goals: Patient/caregiver agrees to and verbalizes understanding of need to use nutritional supplements and/or vitamins as prescribed Date Initiated: 08/20/2020 Date Inactivated: 09/17/2020 Target Resolution Date: 09/21/2020 Goal Status: Met Patient/caregiver will maintain therapeutic glucose control Date Initiated: 08/20/2020 Target Resolution Date: 10/19/2020 Goal Status: Active Interventions: Assess HgA1c results as ordered upon admission and as needed Assess patient nutrition upon admission and as needed per policy Provide education on elevated blood sugars and impact on wound healing Provide education on nutrition Treatment Activities: Education provided on Nutrition : 08/27/2020 Notes: Wound/Skin Impairment Nursing Diagnoses: Impaired tissue integrity Knowledge deficit related to ulceration/compromised skin integrity Goals: Patient/caregiver will verbalize understanding of skin care regimen Date Initiated: 08/20/2020 Target Resolution Date: 10/19/2020 Goal Status: Active Ulcer/skin breakdown will have a volume reduction of 30% by week 4 Date Initiated: 08/20/2020 Date Inactivated: 09/17/2020 Target Resolution Date: 09/21/2020 Goal Status: Met Interventions: Assess patient/caregiver ability to obtain necessary supplies Assess patient/caregiver ability to perform ulcer/skin care regimen upon admission and as needed Assess ulceration(s) every visit Provide education on ulcer and skin care Notes: Electronic Signature(s) Signed: 10/01/2020 5:39:21 PM By: [name] RN, BSN Entered By: [name] on 10/01/2020 12:06:10 -------------------------------------------------------------------------------- Pain Assessment Details Patient Name: Date of Service: [facility], [place] 12/03/1636 11:00 A M Medical Record Number: [id number] Patient Account Number: [id number] Date  of Birth/Sex: Treating RN: 1965-11-07 (55 y.o. [name] Primary Care Ikram Riebe: [name] Other Clinician: Referring Vida Nicol: Treating Juletta Berhe/Extender: [name], [name] in Treatment: 6 Active Problems Location of Pain Severity and Description of Pain Patient Has Paino No Site Locations Pain Management and Medication Current Pain Management: Electronic Signature(s) Signed: 10/01/2020 5:00:30 PM By: Sandre Kitty Signed: 10/01/2020 5:39:21 PM By: [name] RN, BSN Entered By: Sandre Kitty on 10/01/2020 11:15:40 -------------------------------------------------------------------------------- Patient/Caregiver Education Details Patient Name: Date of Service: [facility], [place] 2/1/[id number] A M Medical Record Number: [id number] Patient Account Number: [id number] Date of Birth/Gender: Treating RN: [birth year] (54 y.o. [name] Primary Care Physician: [name] Other Clinician: Referring Physician: Treating Physician/Extender: Elwyn Lade in Treatment: 6 Education Assessment Education Provided To: Patient Education Topics Provided Wound/Skin Impairment: Methods: Explain/Verbal Responses: State content correctly Electronic Signature(s) Signed: 10/01/2020 5:39:21 PM By: [name] RN, BSN Entered By: [name] on 10/01/2020 12:06:27 -------------------------------------------------------------------------------- Wound Assessment Details Patient Name: Date of Service: [facility], [place] 01/08/2799 11:00 A M Medical Record Number: [id number] Patient Account Number: [id number] Date of Birth/Sex: Treating RN: 12-10-1965 (55 y.o. Erie Noe Primary Care Sung Renton: [name] Other Clinician: Referring Pryce Folts: Treating Cerys Winget/Extender: [name], [name] in Treatment: 6 Wound  Status Wound Number: 6 Primary Diabetic Wound/Ulcer of the Lower  Extremity Etiology: Wound Location: Left Metatarsal head fourth Wound Status: Open Wounding Event: Gradually Appeared Comorbid Cataracts, Hypertension, Type II Diabetes, Osteomyelitis, Date Acquired: 07/03/2020 History: Neuropathy Weeks Of Treatment: 6 Clustered Wound: No Photos Wound Measurements Length: (cm) 0.2 Width: (cm) 0.3 Depth: (cm) 0.6 Area: (cm) 0.047 Volume: (cm) 0.028 % Reduction in Area: 96.7% % Reduction in Volume: 95% Epithelialization: Medium (34-66%) Tunneling: No Undermining: Yes Starting Position (o'clock): 12 Ending Position (o'clock): 12 Maximum Distance: (cm) 0.6 Wound Description Classification: Grade 1 Wound Margin: Well defined, not attached Exudate Amount: Medium Exudate Type: Purulent Exudate Color: yellow, brown, green Foul Odor After Cleansing: No Slough/Fibrino Yes Wound Bed Granulation Amount: Large (67-100%) Exposed Structure Granulation Quality: Pink, Pale Fascia Exposed: No Necrotic Amount: Small (1-33%) Fat Layer (Subcutaneous Tissue) Exposed: Yes Necrotic Quality: Adherent Slough Tendon Exposed: No Muscle Exposed: No Joint Exposed: No Bone Exposed: No Electronic Signature(s) Signed: 10/01/2020 5:00:30 PM By: Sandre Kitty Signed: 10/01/2020 5:18:58 PM By: Rhae Hammock RN Entered By: Sandre Kitty on 10/01/2020 16:51:57 -------------------------------------------------------------------------------- Vitals Details Patient Name: Date of Service: [facility], [place] 01/31/4781 11:00 A M Medical Record Number: [id number] Patient Account Number: [id number] Date of Birth/Sex: Treating RN: May 20, 1966 (55 y.o. [name] Primary Care Thornton Dohrmann: [name] Other Clinician: Referring Eh Sauseda: Treating Shawna Wearing/Extender: [name], [name] in Treatment: 6 Vital Signs Time Taken: 11:15 Temperature (F): 98.4 Height (in): 76 Pulse (bpm): 76 Weight (lbs): 260 Respiratory Rate (breaths/min):  16 Body Mass Index (BMI): 31.6 Blood Pressure (mmHg): 129/76 Reference Range: 80 - 120 mg / dl Electronic Signature(s) Signed: 10/01/2020 5:00:30 PM By: Sandre Kitty Entered By: Sandre Kitty on 10/01/2020 11:15:32

## 2020-10-01 NOTE — Progress Notes (Signed)
[name], FEBLES ([id number]) Visit Report for 10/01/2020 Debridement Details Patient Name: Date of Service: [place], [place] 9/0/2409 11:00 A M Medical Record Number: [id number] Patient Account Number: [id number] Date of Birth/Sex: Treating RN: [birth year] (55 y.o. [name] Primary Care Provider: [name] Other Clinician: Referring Provider: Treating Provider/Extender: Elwyn Lade in Treatment: 6 Debridement Performed for Assessment: Wound #6 Left Metatarsal head fourth Performed By: Physician [name]., MD Debridement Type: Debridement Severity of Tissue Pre Debridement: Fat layer exposed Level of Consciousness (Pre-procedure): Awake and Alert Pre-procedure Verification/Time Out Yes - 12:02 Taken: Start Time: 12:02 T Area Debrided (L x W): otal 0.2 (cm) x 0.3 (cm) = 0.06 (cm) Tissue and other material debrided: Non-Viable, Callus, Skin: Epidermis Level: Skin/Epidermis Debridement Description: Selective/Open Wound Instrument: Blade, Forceps Bleeding: Minimum Hemostasis Achieved: Pressure End Time: 12:03 Procedural Pain: 0 Post Procedural Pain: 0 Response to Treatment: Procedure was tolerated well Level of Consciousness (Post- Awake and Alert procedure): Post Debridement Measurements of Total Wound Length: (cm) 0.2 Width: (cm) 0.3 Depth: (cm) 0.6 Volume: (cm) 0.028 Character of Wound/Ulcer Post Debridement: Improved Severity of Tissue Post Debridement: Fat layer exposed Post Procedure Diagnosis Same as Pre-procedure Electronic Signature(s) Signed: 10/01/2020 5:25:01 PM By: [name] MD Signed: 10/01/2020 5:39:21 PM By: [name] RN, BSN Entered By: [name] on 10/01/2020 12:37:13 -------------------------------------------------------------------------------- HPI Details Patient Name: Date of Service: [facility], [place] 07/09/8339 11:00 A M Medical Record Number: [id number] Patient Account  Number: [id number] Date of Birth/Sex: Treating RN: Sep 29, 1965 (55 y.o. [name] Primary Care Provider: [name] Other Clinician: Referring Provider: Treating Provider/Extender: [name], [name] in Treatment: 6 History of Present Illness HPI Description: ADMISSION 05/04/2018 This is a 55 year old man who apparently is a type I diabetic confirmed by appropriate serology testing 6 years ago. He is therefore on insulin. His most recent hemoglobin A1c was 10.7 on 03/27/2018. 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 As a result of a combination of this he is insensate and even has reduced sensation in his upper extremities. His current problem started in early July he noted a blister over the left fourth plantar metatarsal head. He saw [name] his podiatrist on 01/07/2018. At that time he had a wound that measured 2 x 1 x 0.4 cm. An x-ray did not show osteomyelitis. It did not appear that he actually followed up. He was admitted to hospital on 03/25/2018 through 03/30/2018 with his left foot painful and swollen. He underwent a surgical IandD by [name]. I believe his culture at that time showed Enterococcus faecalis and a bone culture showed enterococcus avium. He is been reviewed by [name] of infectious disease although I have not reviewed his note and he has been on 6 weeks worth of ertapenem which should finish sometime apparently on December 9.. MRI suggested possibility of septic arthritis but did not comment specifically on osteomyelitis. A more recent wound culture was negative. The patient has been referred here for consideration among other things of hyperbaric oxygen versus proceeding with a partial ray amputation. This would involve excision of the fourth metatarsal head. The patient is  offloading this and a Darco forefoot off loader. I think he is using iodoform packing. 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 Certainly would be a possibility of a total  contact cast. I did not see any arterial studies on him however his peripheral pulses are palpable 05/11/2018; patient started his hyperbarics on Friday but did not dive yesterday for personal issues. He saw [name] on 12//19 he notes that during the hospitalization in October 2019 he had an abscess and underwent a bone biopsy with culture that showed group B strep, enterococcus avium and Enterococcus faecalis. There was Prevotella from the abscess. He was put on ertapenem for 6 weeks which he completed. Per [name] notable for the fact that he is not been treated with ampicillin or amoxicillin. His hemoglobin A1c was noted to be high at 10.7. He is started on ampicillin sulbactam every 6 again I think this is for better coverage for the enterococcus. We started him on silver alginate as there is still purulent looking drainage coming out of the wound and there is indeed exposed bone. The patient has not been unwell 05/18/2018; patient continues with hyperbarics he is tolerating this well and has no complaints. He is on Unasyn every 6. We have been using silver alginate to the area over the fourth plantar metatarsal head. He is a poorly controlled diabetic. There is certainly less purulent drainage than when I first saw this man at the beginning of the month. He only sedimentation rate I see on him was 40 on 03/26/2018. States his blood sugars are under better control in the 120s to 130 range. Follows with his endocrinologist in [facility] I change his primary dressing to Prisma today attempting to increase the granulation. After I left the room he called [place] back into look at a area that he noticed on the right foot 2 days ago. Very superficial and in the medial aspect of his high plantar arch [secondary  to his neuropathy] he has a custom insert in the shoe on the right foot. 05/24/2018; continues with HBO doing well. He saw infectious disease today and is still on Unasyn every 6 hours. Not making a lot of progress on the left foot with Prisma I changed him to silver alginate strips. The new area on the right midfoot he is putting silver alginate on this as well. 06/01/2018; patient remains on Unasyn. Not making a lot of progress with granulation. I am going to put him in a total contact cast use Prisma for now. He has a high co-pay for an advanced treatment option but I suspect feeling in this wound may come to that 06/04/2018; patient back for the obligatory first total contact cast change. We continue to use Prisma 06/08/2018; the patient is tolerating the cast well although he had some loosening over the tibia which rubbed when he walked. There is no lesion here. We have been using Prisma under the total contact cast. He is in hyperbarics which she is tolerating well 06/15/2018; tolerating total contact cast and hyperbarics well. He continues IV antibiotics and for the underlying osteomyelitis as directed by infectious disease. His wound is making really nice progress 1/21; Patient's wound is down in depth. Using silver collagen. Ab's end this week and Pic line is to be d/ced 1/28; 0.8 cm in depth which is more than last time. We have been using silver collagen under total contact cast. He continues in hyperbarics. His IV antibiotics are finished PICC line has been removed 2/4; surprisingly today the area is actually epithelialized. There is no open area. There is however a divot where the epithelium is gone down to close the wound surface. I am  not completely convinced that this is going to remain viable over time but I think time is the best thing to determine this. There is no evidence of surrounding infection. As of today I do not think a repeat MRI or any other procedure is going to be indicative  of whether this is going to maintain a closed state. I am going to continue him in a total contact cast. He will complete HBO. Next week I will allow him perhaps to graduate into a Darco forefoot off loader, then a healing sandal. I will then send him back to podiatry to see if they can fashion an offloading shoe if this area remains closed 2/10; the patient was taken out of the cast today the area is healed. He has a divot however this is fully epithelialized and the epithelialized tissue looks healthy. I have put him into a Darco forefoot off loader. He will complete his last 2 hyperbaric treatments. I will see him next week and I may transition him into a surgical shoe versus his own shoe. He is following up with [name] of podiatry and I think a diabetic shoe with custom inserts could be provided with the signature provided by his endocrinologist ADMISSION 6/22 CLINICAL DATA: Foot ulcer, question of osteomyelitis EXAM: MRI OF THE LEFT FOOT WITHOUT CONTRAST TECHNIQUE: Multiplanar, multisequence MR imaging of the left was performed. No intravenous contrast was administered. COMPARISON: Radiograph same day FINDINGS: Bones/Joint/Cartilage Small cystic changes are seen at the fifth metatarsal head, likely degenerative changes. No marrow edema, osseous fracture, or periosteal reaction are seen throughout the osseous structures. Small joint effusion seen at the fifth MTP joint. There appears to be chronic cortical irregularity with erosive type change seen at the fourth metatarsal head which is unchanged since June 28, 2019, however new since 2019. Ligaments The Lisfranc ligaments and collateral ligaments appear to be intact. Muscles and Tendons Diffusely increased signal seen throughout the muscles surrounding the forefoot with fatty atrophy. The flexor and extensor tendons appear to be intact. The plantar fascia is intact. Soft tissues Along the dorsal lateral aspect of the  fifth metatarsal shaft there is a focal area superficial ulceration measuring approximately 8 mm in transverse dimension. There is overlying skin thickening and subcutaneous edema seen. No sinus tract or loculated fluid collection is noted. IMPRESSION: 1. Small focal area of ulceration along the dorsal lateral aspect of the fifth metatarsal shaft. No definite evidence of acute osteomyelitis, sinus tract, or abscess. 2. Progressive chronic erosive cortical destruction seen at the fourth metatarsal head since 2019, however unchanged since June 28, 2019, which could be from chronic osteomyelitis or prior injury. 3. Small joint effusion at the fifth MTP joint. Electronically Signed By: Prudencio Pair M.D. On: 08/02/2019 89:35 55 year old type I diabetic who we had in clinic in late 2019 to February 2020 with osteomyelitis of his left fourth metatarsal head and a wound in this area. This eventually healed with IV antibiotics and hyperbaric oxygen. He was relatively well until February of this year when he developed I believe 2 wounds on the left fifth met head 1 plantar and 1 more laterally. The one more laterally has since closed over but he has been left with a fairly persistent wound in the plantar left fifth met head. Has been following with [name] of podiatry. For an extended period of time he used Medihoney. He received doxycycline and April at which time a culture showed E. coli and MSSA. An MRI done on August 02, 2019 [shown above] did not show osteomyelitis but did suggest some ongoing destruction at the fourth metatarsal head. He was switched to silver alginate as the primary dressing in May. Earlier this month he received a 2-week course of Bactrim due to erythema around the wound. He was referred to Dr. Alfred Levins of infectious disease who did not think anything was infected and recommended stopping antibiotics. The patient said he felt really well has you finished up the Bactrim but  then yesterday morning he became febrile again and noted more erythema around the wound. He was referred here for our review of this. His temperature today is 99.3. He denies any other infectious symptoms no cough no upper respiratory, no dysuria etc. He has been using silver alginate and offloading this area with a surgical shoe. 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 He apparently had shoulder surgery in the fall 2020 I did not look this up he apparently has hardware in this area. Recent x-ray done in Dr. Pasty Arch office on 6/7 was negative for osteo- ABIs have always been noncompressible. Is not previously been thought that arterial insufficiency is contributing to this. 6/29; patient returns the clinic. I thought he might be going to see Dr. Earleen Newport ongoing for this wound but he is back today. He did see Dr. Earleen Newport on 11/22/2019 I believe he agreed with the MRI and apparently was making some arrangements to have this done in [facility] which I am fine with if they can compare with the study of March 2. In any case he returns with a clinic of the wound is not as good. He has been using silver alginate. He is still on another antibiotic that he says Dr. Earleen Newport prescribed 7/8; the patient had his MRI which showed a progressive soft tissue ulceration plantar to the fifth MTP joint with increased soft tissue enhancement throughout the small toe no abscess was noted. There was progressive signal changes in the fifth metatarsal head and fifth proximal phalanx without cortical destruction or definite abnormal T1 signal. These findings are nonspecific could be secondary to hyperemia or early osteomyelitis. He is on amoxicillin as directed by Dr. Earleen Newport. There were plans to do a bone biopsy and culture done in the OR on Friday however the patient canceled this  because of insurance issues [surgical center not in [place]. In any case I have told him that this procedure in my opinion needs to be done. He is using Silvadene cream READMISSION 02/07/2020 This is a patient who had a progressive ulceration over the fifth MTP. We had him here in June and into the early July. He was also followed by [name] of podiatry. The day after we last saw him on 7/9 he went underwent a excision of the fifth MTP to aid in wound healing over this area. Pathology did not show osteomyelitis. Since then he has been following with Dr. Earleen Newport. Notable that earlier this month he presented with worse wound with purulence. CandS showed MSSA and he was treated with doxycycline and Silvadene cream. More recently he has been changed to Augmentin. He is using Betadine to the wound. He has a forefoot offloading shoe at some point in the postoperative. This wound opened in the surgical line. He is here for our review of this. With regards to his diabetes I spent some time on this today. This was diagnosed  in his mid 40s he was on pills for a while and then on insulin. When he was here last time he told me that he thought he had had antipancreatic antibodies that showed he was type I if so I do not see this. He followed for a period of time with Dr. Meredith Pel endocrinology at [facility] who I gather is currently independent practice in [facility]. I have looked through her notes as it applies to what is available in care everywhere and she consistently calls him a type II diabetic. If he had other studies of his diabetes I do not see this. I have therefore changed my label of him to type II ABI in our clinic was 1.41 on the left 9/13; arrives in clinic today with things not looking as good. He has a small linear wound in the incision line but nonviable rolled edges and debris around the circumference. As well it looks as though he had a deep tissue injury over the base of the fifth  metatarsal. Raised may be blistered but no open area here. Also concerning erythema around the wound both distally and proximally 9/20; PCR culture I did last time showed high Enterococcus faecalis, medium MRSA and 3 different anaerobic's including medium Peptostreptococcus magnus. I gave him a combination of Augmentin and trimethoprim sulfamethoxazole starting on 9/16 for 10 days. He is tolerating these well He comes in today. His original wound area at the surgical site only has a small crevice we are using endoform here. Not surprisingly the area over the base of the fifth metatarsal has open with a superficial wound. He has a wrap/surgical shoe injury dorsally on the left foot he is asking about a total contact cast and I think what I know about this area suggests that the lateral part of his foot is a weightbearing surface and I think we could certainly justify this. 9/27; patient is finishing his antibiotics. His surgical wound is actually closed the area over the base of the left fifth metatarsal of is also closed but still with some eschar. 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 As such he actually walks on the outside of his foot. He has been in touch with [facility] Liliane Channel is their orthotic specialist READMISSION 08/20/2020 This is a 55 year old man who is a type II diabetic. 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 He has been in this clinic before with wounds over his left fifth and left fourth met head. He has a history of osteomyelitis in the left fifth met head requiring an excision of the fifth MTP.. The patient tells [place] that he has an AFO brace now to correct the ankle deformity. Sometime in February he developed a callus over the fourth metatarsal head ultimately Dr. Earleen Newport open this area to expose the wound. He used Silvadene cream for a while most  recently Promogran. He has had antibiotics as well including doxycycline and apparently currently Augmentin although I am not certain there is been any cultures. He states the area has not been x-rayed. 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 The met head here is subluxed. His x-ray that we ordered last week was  negative for osteomyelitis. Clinically there does not appear to be infection here. No erythema no drainage the wound does not probe to bone 4/4; put a total contact cast on him last week primary dressing of silver collagen he arrives in clinic today with a wound measuring larger and as opposed to last week a direct probe to bone. He is definitely going to need an MRI 4/11; miraculously the divot the probe to bone his filled in this week. Unfortunately his MRI is not till next Monday. We have been using silver collagen under a total contact cast. He has completed his antibiotics 4/18; no palpable bone. The distal half of this wound is has depth but there is no exposed bone. More proximal is at the level of the surrounding skin. His MRI is being delayed because of insurance issues was supposed to be today then Wednesday and now we are not exactly sure what the date is or what the issue is. In general things look somewhat better. We have been using silver collagen under the total contact cast 4/27; patient presents for removal and placement of TCC. He has tolerated the cast well in the past week. He has no complaints today. This MRI is still pending scheduling. We will not use a silver dressing so in case it does get scheduled he can get this done without issue. 5/2; patient's wound is smaller but with undermining. We finally have approval for his MRI which has been going on for a month or more  now. Electronic Signature(s) Signed: 10/01/2020 5:25:01 PM By: [name] MD Entered By: [name] on 10/01/2020 12:39:31 -------------------------------------------------------------------------------- Physical Exam Details Patient Name: Date of Service: [facility], [place], [place] 0/11/1217 11:00 A M Medical Record Number: [id number] Patient Account Number: [id number] Date of Birth/Sex: Treating RN: 03/06/1966 (55 y.o. [name] Primary Care Provider: [name] Other Clinician: Referring Provider: Treating Provider/Extender: [name], [name] in Treatment: 6 Constitutional Sitting or standing Blood Pressure is within target range for patient.. Pulse regular and within target range for patient.Marland Kitchen Respirations regular, non-labored and within target range.. Temperature is normal and within the target range for the patient.Marland Kitchen Appears in no distress. Notes Wound exam; left foot left fourth metatarsal head. The wound is small but does not probe to bone nevertheless the granulation does not look particularly healthy but not infected. Used pickups and a #15 scalpel to remove skin from the undermining area. Still using the T contact cast otal Electronic Signature(s) Signed: 10/01/2020 5:25:01 PM By: [name] MD Entered By: [name] on 10/01/2020 12:40:17 -------------------------------------------------------------------------------- Physician Orders Details Patient Name: Date of Service: [facility], [place] 01/01/6414 11:00 A M Medical Record Number: [id number] Patient Account Number: [id number] Date of Birth/Sex: Treating RN: Jul 08, 1965 (55 y.o. [name] Primary Care Provider: [name] Other Clinician: Referring Provider: Treating Provider/Extender: Elwyn Lade in Treatment: 6 Verbal / Phone Orders: No Diagnosis Coding ICD-10 Coding Code Description E11.621 Type 2 diabetes mellitus with foot  ulcer L97.528 Non-pressure chronic ulcer of other part of left foot with other specified severity Follow-up Appointments ppointment in 1 week. - Dr. Dellia Nims Return A Bathing/ Shower/ Hygiene May shower with protection but do not get wound dressing(s) wet. Off-Loading Total Contact Cast to Left Lower Extremity Additional Orders / Instructions Other: - Call to schedule appt for MRI Wound Treatment Wound #6 - Metatarsal head fourth Wound Laterality: Left Cleanser: Wound Cleanser 1 x Per Week/7 Days Discharge Instructions: Cleanse the  wound with wound cleanser prior to applying a clean dressing using gauze sponges, not tissue or cotton balls. Prim Dressing: Maxorb Extra Calcium Alginate 2x2 in 1 x Per Week/7 Days ary Discharge Instructions: Apply calcium alginate to wound bed as instructed Secondary Dressing: Woven Gauze Sponges 2x2 in (Generic) 1 x Per Week/7 Days Discharge Instructions: Apply over primary dressing as directed. Secondary Dressing: Optifoam Non-Adhesive Dressing, 4x4 in 1 x Per Week/7 Days Discharge Instructions: Apply over primary dressing cut to make foam donut Secured With: 35M Medipore H Soft Cloth Surgical T 4 x 2 (in/yd) 1 x Per Week/7 Days ape Discharge Instructions: Secure dressing with tape as directed. Electronic Signature(s) Signed: 10/01/2020 5:25:01 PM By: [name] MD Signed: 10/01/2020 5:39:21 PM By: [name] RN, BSN Entered By: [name] on 10/01/2020 12:05:01 -------------------------------------------------------------------------------- Problem List Details Patient Name: Date of Service: [facility], [place] 07/08/7122 11:00 A M Medical Record Number: [id number] Patient Account Number: [id number] Date of Birth/Sex: Treating RN: June 13, 1965 (55 y.o. [name] Primary Care Provider: [name] Other Clinician: Referring Provider: Treating Provider/Extender: [name], [name] in Treatment: 6 Active  Problems ICD-10 Encounter Code Description Active Date MDM Diagnosis E11.621 Type 2 diabetes mellitus with foot ulcer 08/20/2020 No Yes L97.528 Non-pressure chronic ulcer of other part of left foot with other specified 08/20/2020 No Yes severity Inactive Problems Resolved Problems Electronic Signature(s) Signed: 10/01/2020 5:25:01 PM By: [name] MD Entered By: [name] on 10/01/2020 12:36:23 -------------------------------------------------------------------------------- Progress Note Details Patient Name: Date of Service: [facility], [place] 07/07/537 11:00 A M Medical Record Number: [id number] Patient Account Number: [id number] Date of Birth/Sex: Treating RN: 02/12/1966 (55 y.o. [name] Primary Care Provider: [name] Other Clinician: Referring Provider: Treating Provider/Extender: [name], [name] in Treatment: 6 Subjective History of Present Illness (HPI) ADMISSION 05/04/2018 This is a 55 year old man who apparently is a type I diabetic confirmed by appropriate serology testing 6 years ago. He is therefore on insulin. His most recent hemoglobin A1c was 10.7 on 03/27/2018. 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 As a result of a combination of this he is insensate and even has reduced sensation in his upper extremities. His current problem started in early July he noted a blister over the left fourth plantar metatarsal head. He saw [name] his podiatrist on 01/07/2018. At that time he had a wound that measured 2 x 1 x 0.4 cm. An x-ray did not show osteomyelitis. It did not appear that he actually followed up. He was admitted to hospital on 03/25/2018 through 03/30/2018 with his left foot painful and swollen. He underwent a surgical IandD by [name]. I believe his culture  at that time showed Enterococcus faecalis and a bone culture showed enterococcus avium. He is been reviewed by [name] of infectious disease although I have not reviewed his note and he has been on 6 weeks worth of ertapenem which should finish sometime apparently on December 9.. MRI suggested possibility of septic arthritis but did not comment specifically on osteomyelitis. A more recent wound culture was negative. The patient has been referred here for consideration among other things of hyperbaric oxygen versus proceeding with a partial ray amputation. This would involve excision of the fourth metatarsal head. The patient is offloading this and a Darco forefoot off loader. I think he is using  iodoform packing. 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 Certainly would be a possibility of a total contact cast. I did not see any arterial studies on him however his peripheral pulses are palpable 05/11/2018; patient started his hyperbarics on Friday but did not dive yesterday for personal issues. He saw [name] on 12//19 he notes that during the hospitalization in October 2019 he had an abscess and underwent a bone biopsy with culture that showed group B strep, enterococcus avium and Enterococcus faecalis. There was Prevotella from the abscess. He was put on ertapenem for 6 weeks which he completed. Per [name] notable for the fact that he is not been treated with ampicillin or amoxicillin. His hemoglobin A1c was noted to be high at 10.7. He is started on ampicillin sulbactam every 6 again I think this is for better coverage for the enterococcus. We started him on silver alginate as there is still purulent looking drainage coming out of the wound and there is indeed exposed bone. The patient has not been unwell 05/18/2018; patient continues with hyperbarics he is tolerating this well and has no complaints. He is on Unasyn every 6. We have been using silver alginate  to the area over the fourth plantar metatarsal head. He is a poorly controlled diabetic. There is certainly less purulent drainage than when I first saw this man at the beginning of the month. He only sedimentation rate I see on him was 40 on 03/26/2018. States his blood sugars are under better control in the 120s to 130 range. Follows with his endocrinologist in [facility] I change his primary dressing to Prisma today attempting to increase the granulation. After I left the room he called [place] back into look at a area that he noticed on the right foot 2 days ago. Very superficial and in the medial aspect of his high plantar arch [secondary to his neuropathy] he has a custom insert in the shoe on the right foot. 05/24/2018; continues with HBO doing well. He saw infectious disease today and is still on Unasyn every 6 hours. Not making a lot of progress on the left foot with Prisma I changed him to silver alginate strips. The new area on the right midfoot he is putting silver alginate on this as well. 06/01/2018; patient remains on Unasyn. Not making a lot of progress with granulation. I am going to put him in a total contact cast use Prisma for now. He has a high co-pay for an advanced treatment option but I suspect feeling in this wound may come to that 06/04/2018; patient back for the obligatory first total contact cast change. We continue to use Prisma 06/08/2018; the patient is tolerating the cast well although he had some loosening over the tibia which rubbed when he walked. There is no lesion here. We have been using Prisma under the total contact cast. He is in hyperbarics which she is tolerating well 06/15/2018; tolerating total contact cast and hyperbarics well. He continues IV antibiotics and for the underlying osteomyelitis as directed by infectious disease. His wound is making really nice progress 1/21; Patient's wound is down in depth. Using silver collagen. Ab's end this week and Pic line is to  be d/ced 1/28; 0.8 cm in depth which is more than last time. We have been using silver collagen under total contact cast. He continues in hyperbarics. His IV antibiotics are finished PICC line has been removed 2/4; surprisingly today the area is actually epithelialized. There  is no open area. There is however a divot where the epithelium is gone down to close the wound surface. I am not completely convinced that this is going to remain viable over time but I think time is the best thing to determine this. There is no evidence of surrounding infection. As of today I do not think a repeat MRI or any other procedure is going to be indicative of whether this is going to maintain a closed state. I am going to continue him in a total contact cast. He will complete HBO. Next week I will allow him perhaps to graduate into a Darco forefoot off loader, then a healing sandal. I will then send him back to podiatry to see if they can fashion an offloading shoe if this area remains closed 2/10; the patient was taken out of the cast today the area is healed. He has a divot however this is fully epithelialized and the epithelialized tissue looks healthy. I have put him into a Darco forefoot off loader. He will complete his last 2 hyperbaric treatments. I will see him next week and I may transition him into a surgical shoe versus his own shoe. He is following up with [name] of podiatry and I think a diabetic shoe with custom inserts could be provided with the signature provided by his endocrinologist ADMISSION 6/22 CLINICAL DATA: Foot ulcer, question of osteomyelitis EXAM: MRI OF THE LEFT FOOT WITHOUT CONTRAST TECHNIQUE: Multiplanar, multisequence MR imaging of the left was performed. No intravenous contrast was administered. COMPARISON: Radiograph same day FINDINGS: Bones/Joint/Cartilage Small cystic changes are seen at the fifth metatarsal head, likely degenerative changes. No marrow edema, osseous  fracture, or periosteal reaction are seen throughout the osseous structures. Small joint effusion seen at the fifth MTP joint. There appears to be chronic cortical irregularity with erosive type change seen at the fourth metatarsal head which is unchanged since June 28, 2019, however new since 2019. Ligaments The Lisfranc ligaments and collateral ligaments appear to be intact. Muscles and Tendons Diffusely increased signal seen throughout the muscles surrounding the forefoot with fatty atrophy. The flexor and extensor tendons appear to be intact. The plantar fascia is intact. Soft tissues Along the dorsal lateral aspect of the fifth metatarsal shaft there is a focal area superficial ulceration measuring approximately 8 mm in transverse dimension. There is overlying skin thickening and subcutaneous edema seen. No sinus tract or loculated fluid collection is noted. IMPRESSION: 1. Small focal area of ulceration along the dorsal lateral aspect of the fifth metatarsal shaft. No definite evidence of acute osteomyelitis, sinus tract, or abscess. 2. Progressive chronic erosive cortical destruction seen at the fourth metatarsal head since 2019, however unchanged since June 28, 2019, which could be from chronic osteomyelitis or prior injury. 3. Small joint effusion at the fifth MTP joint. Electronically Signed By: Prudencio Pair M.D. On: 08/02/2019 74:21 55 year old type I diabetic who we had in clinic in late 2019 to February 2020 with osteomyelitis of his left fourth metatarsal head and a wound in this area. This eventually healed with IV antibiotics and hyperbaric oxygen. He was relatively well until February of this year when he developed I believe 2 wounds on the left fifth met head 1 plantar and 1 more laterally. The one more laterally has since closed over but he has been left with a fairly persistent wound in the plantar left fifth met head. Has been following with [name] of  podiatry. For an extended period of time he  used Medihoney. He received doxycycline and April at which time a culture showed E. coli and MSSA. An MRI done on August 02, 2019 [shown above] did not show osteomyelitis but did suggest some ongoing destruction at the fourth metatarsal head. He was switched to silver alginate as the primary dressing in May. Earlier this month he received a 2-week course of Bactrim due to erythema around the wound. He was referred to Dr. Alfred Levins of infectious disease who did not think anything was infected and recommended stopping antibiotics. The patient said he felt really well has you finished up the Bactrim but then yesterday morning he became febrile again and noted more erythema around the wound. He was referred here for our review of this. His temperature today is 99.3. He denies any other infectious symptoms no cough no upper respiratory, no dysuria etc. He has been using silver alginate and offloading this area with a surgical shoe. 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 He apparently had shoulder surgery in the fall 2020 I did not look this up he apparently has hardware in this area. Recent x-ray done in Dr. Pasty Arch office on 6/7 was negative for osteo- ABIs have always been noncompressible. Is not previously been thought that arterial insufficiency is contributing to this. 6/29; patient returns the clinic. I thought he might be going to see Dr. Earleen Newport ongoing for this wound but he is back today. He did see Dr. Earleen Newport on 11/22/2019 I believe he agreed with the MRI and apparently was making some arrangements to have this done in [facility] which I am fine with if they can compare with the study of March 2. In any case he returns with a clinic of the wound is not as good. He has been using silver alginate. He is still on  another antibiotic that he says Dr. Earleen Newport prescribed 7/8; the patient had his MRI which showed a progressive soft tissue ulceration plantar to the fifth MTP joint with increased soft tissue enhancement throughout the small toe no abscess was noted. There was progressive signal changes in the fifth metatarsal head and fifth proximal phalanx without cortical destruction or definite abnormal T1 signal. These findings are nonspecific could be secondary to hyperemia or early osteomyelitis. He is on amoxicillin as directed by Dr. Earleen Newport. There were plans to do a bone biopsy and culture done in the OR on Friday however the patient canceled this because of insurance issues [surgical center not in [place]. In any case I have told him that this procedure in my opinion needs to be done. He is using Silvadene cream READMISSION 02/07/2020 This is a patient who had a progressive ulceration over the fifth MTP. We had him here in June and into the early July. He was also followed by [name] of podiatry. The day after we last saw him on 7/9 he went underwent a excision of the fifth MTP to aid in wound healing over this area. Pathology did not show osteomyelitis. Since then he has been following with Dr. Earleen Newport. Notable that earlier this month he presented with worse wound with purulence. CandS showed MSSA and he was treated with doxycycline and Silvadene cream. More recently he has been changed to Augmentin. He is using Betadine to the wound. He has a forefoot offloading shoe at some point in the postoperative. This wound opened in the surgical line. He is  here for our review of this. With regards to his diabetes I spent some time on this today. This was diagnosed in his mid 21s he was on pills for a while and then on insulin. When he was here last time he told me that he thought he had had antipancreatic antibodies that showed he was type I if so I do not see this. He followed for a period of time with Dr.  Meredith Pel endocrinology at [facility] who I gather is currently independent practice in [facility]. I have looked through her notes as it applies to what is available in care everywhere and she consistently calls him a type II diabetic. If he had other studies of his diabetes I do not see this. I have therefore changed my label of him to type II ABI in our clinic was 1.41 on the left 9/13; arrives in clinic today with things not looking as good. He has a small linear wound in the incision line but nonviable rolled edges and debris around the circumference. As well it looks as though he had a deep tissue injury over the base of the fifth metatarsal. Raised may be blistered but no open area here. Also concerning erythema around the wound both distally and proximally 9/20; PCR culture I did last time showed high Enterococcus faecalis, medium MRSA and 3 different anaerobic's including medium Peptostreptococcus magnus. I gave him a combination of Augmentin and trimethoprim sulfamethoxazole starting on 9/16 for 10 days. He is tolerating these well He comes in today. His original wound area at the surgical site only has a small crevice we are using endoform here. Not surprisingly the area over the base of the fifth metatarsal has open with a superficial wound. He has a wrap/surgical shoe injury dorsally on the left foot he is asking about a total contact cast and I think what I know about this area suggests that the lateral part of his foot is a weightbearing surface and I think we could certainly justify this. 9/27; patient is finishing his antibiotics. His surgical wound is actually closed the area over the base of the left fifth metatarsal of is also closed but still with some eschar. 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 As such he actually walks on the outside of his foot. He has been in touch with [facility] Liliane Channel is their  orthotic specialist READMISSION 08/20/2020 This is a 55 year old man who is a type II diabetic. 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 He has been in this clinic before with wounds over his left fifth and left fourth met head. He has a history of osteomyelitis in the left fifth met head requiring an excision of the fifth MTP.. The patient tells [place] that he has an AFO brace now to correct the ankle deformity. Sometime in February he developed a callus over the fourth metatarsal head ultimately Dr. Earleen Newport open this area to expose the wound. He used Silvadene cream for a while most recently Promogran. He has had antibiotics as well including doxycycline and apparently currently Augmentin although I am not certain there is been any cultures. He states the area has not been x-rayed. 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 The met head here is subluxed. His x-ray that we ordered last week was negative for osteomyelitis. Clinically there does not appear to be infection here. No erythema no drainage the wound does not probe to bone 4/4; put a total contact cast on him last week primary dressing of silver collagen he arrives in clinic today with a wound measuring larger and as opposed to last week a direct probe to bone. He is definitely going to need an MRI 4/11; miraculously the divot the probe to bone his filled in this week. Unfortunately his MRI is not till next Monday. We have been using silver collagen under a total contact cast. He has completed his antibiotics 4/18; no palpable bone. The distal half of this wound is has depth but there is no exposed bone. More proximal is at the level of the surrounding skin. His MRI is being delayed because of  insurance issues was supposed to be today then Wednesday and now we are not exactly sure what the date is or what the issue is. In general things look somewhat better. We have been using silver collagen under the total contact cast 4/27; patient presents for removal and placement of TCC. He has tolerated the cast well in the past week. He has no complaints today. This MRI is still pending scheduling. We will not use a silver dressing so in case it does get scheduled he can get this done without issue. 5/2; patient's wound is smaller but with undermining. We finally have approval for his MRI which has been going on for a month or more now. Objective Constitutional Sitting or standing Blood Pressure is within target range for patient.. Pulse regular and within target range for patient.Marland Kitchen Respirations regular, non-labored and within target range.. Temperature is normal and within the target range for the patient.Marland Kitchen Appears in no distress. Vitals Time Taken: 11:15 AM, Height: 76 in, Weight: 260 lbs, BMI: 31.6, Temperature: 98.4 F, Pulse: 76 bpm, Respiratory Rate: 16 breaths/min, Blood Pressure: 129/76 mmHg. General Notes: Wound exam; left foot left fourth metatarsal head. The wound is small but does not probe to bone nevertheless the granulation does not look particularly healthy but not infected. Used pickups and a #15 scalpel to remove skin from the undermining area. Still using the T contact cast otal Integumentary (Hair, Skin) Wound #6 status is Open. Original cause of wound was Gradually Appeared. The date acquired was: 07/03/2020. The wound has been in treatment 6 weeks. The wound is located on the Left Metatarsal head fourth. The wound measures 0.2cm length x 0.3cm width x 0.6cm depth; 0.047cm^2 area and 0.028cm^3 volume. There is Fat Layer (Subcutaneous Tissue) exposed. There is no tunneling noted, however, there is undermining starting at 12:00 and ending at 12:00 with a maximum distance of  0.6cm. There is a medium amount of purulent drainage noted. The wound margin is well defined and not attached to the wound base. There is large (67-100%) pink, pale granulation within the wound bed. There is a small (1-33%) amount of necrotic tissue within the wound bed including Adherent Slough. Assessment Active Problems ICD-10 Type 2 diabetes mellitus with foot ulcer Non-pressure chronic ulcer of other part of left foot with other specified severity Procedures Wound #6 Pre-procedure diagnosis of Wound #6 is a Diabetic Wound/Ulcer of the Lower Extremity located on the Left Metatarsal head fourth .Severity of Tissue Pre Debridement is: Fat layer exposed. There was a Selective/Open Wound Skin/Epidermis Debridement with a  total area of 0.06 sq cm performed by [name]., MD. With the following instrument(s): Blade, and Forceps to remove Non-Viable tissue/material. Material removed includes Callus and Skin: Epidermis and. No specimens were taken. A time out was conducted at 12:02, prior to the start of the procedure. A Minimum amount of bleeding was controlled with Pressure. The procedure was tolerated well with a pain level of 0 throughout and a pain level of 0 following the procedure. Post Debridement Measurements: 0.2cm length x 0.3cm width x 0.6cm depth; 0.028cm^3 volume. Character of Wound/Ulcer Post Debridement is improved. Severity of Tissue Post Debridement is: Fat layer exposed. Post procedure Diagnosis Wound #6: Same as Pre-Procedure Pre-procedure diagnosis of Wound #6 is a Diabetic Wound/Ulcer of the Lower Extremity located on the Left Metatarsal head fourth . There was a T Contact otal Cast Procedure by [name]., MD. Post procedure Diagnosis Wound #6: Same as Pre-Procedure Plan Follow-up Appointments: Return Appointment in 1 week. - Dr. Arcola Jansky Shower/ Hygiene: May shower with protection but do not get wound dressing(s) wet. Off-Loading: T Contact Cast  to Left Lower Extremity otal Additional Orders / Instructions: Other: - Call to schedule appt for MRI WOUND #6: - Metatarsal head fourth Wound Laterality: Left Cleanser: Wound Cleanser 1 x Per Week/7 Days Discharge Instructions: Cleanse the wound with wound cleanser prior to applying a clean dressing using gauze sponges, not tissue or cotton balls. Prim Dressing: Maxorb Extra Calcium Alginate 2x2 in 1 x Per Week/7 Days ary Discharge Instructions: Apply calcium alginate to wound bed as instructed Secondary Dressing: Woven Gauze Sponges 2x2 in (Generic) 1 x Per Week/7 Days Discharge Instructions: Apply over primary dressing as directed. Secondary Dressing: Optifoam Non-Adhesive Dressing, 4x4 in 1 x Per Week/7 Days Discharge Instructions: Apply over primary dressing cut to make foam donut Secured With: 21M Medipore H Soft Cloth Surgical T 4 x 2 (in/yd) 1 x Per Week/7 Days ape Discharge Instructions: Secure dressing with tape as directed. 1. I still think the MRI is necessary 2. Still calcium alginate under the total contact cast Electronic Signature(s) Signed: 10/01/2020 5:25:01 PM By: [name] MD Entered By: [name] on 10/01/2020 12:41:20 -------------------------------------------------------------------------------- Total Contact Cast Details Patient Name: Date of Service: [facility] [place] 11/04/3327 11:00 A M Medical Record Number: [id number] Patient Account Number: [id number] Date of Birth/Sex: Treating RN: 09-16-1965 (55 y.o. [name] Primary Care Provider: [name] Other Clinician: Referring Provider: Treating Provider/Extender: Elwyn Lade in Treatment: 6 T Contact Cast Applied for Wound Assessment: otal Wound #6 Left Metatarsal head fourth Performed By: Physician [name]., MD Post Procedure Diagnosis Same as Pre-procedure Electronic Signature(s) Signed: 10/01/2020 5:25:01 PM By: [name] MD Entered By:  [name] on 10/01/2020 12:37:21 -------------------------------------------------------------------------------- SuperBill Details Patient Name: Date of Service: [facility] [place] 11/03/158 Medical Record Number: [id number] Patient Account Number: [id number] Date of Birth/Sex: Treating RN: 05-Oct-1965 (55 y.o. [name] Primary Care Provider: [name] Other Clinician: Referring Provider: Treating Provider/Extender: [name], [name] in Treatment: 6 Diagnosis Coding ICD-10 Codes Code Description [phone] Type 2 diabetes mellitus with foot ulcer L97.528 Non-pressure chronic ulcer of other part of left foot with other specified severity Facility Procedures CPT4 Code: [id number] Description: [phone] - DEBRIDE WOUND 1ST 20 SQ CM OR < ICD-10 Diagnosis Description L97.528 Non-pressure chronic ulcer of other part of left foot with other specified severi Modifier: ty Quantity: 1 Physician Procedures : CPT4 Code Description Modifier [phone]  [id number] - WC PHYS DEBR WO ANESTH 20 SQ CM ICD-10 Diagnosis Description L97.528 Non-pressure chronic ulcer of other part of left foot with other specified severity Quantity: 1 Electronic Signature(s) Signed: 10/01/2020 5:25:01 PM By: [name] MD Entered By: [name] on 10/01/2020 12:41:33

## 2020-10-08 ENCOUNTER — Other Ambulatory Visit: Payer: Self-pay

## 2020-10-08 ENCOUNTER — Encounter (HOSPITAL_BASED_OUTPATIENT_CLINIC_OR_DEPARTMENT_OTHER): Payer: BLUE CROSS/BLUE SHIELD | Admitting: Internal Medicine

## 2020-10-08 DIAGNOSIS — E10621 Type 1 diabetes mellitus with foot ulcer: Secondary | ICD-10-CM | POA: Diagnosis not present

## 2020-10-08 NOTE — Progress Notes (Addendum)
AJAMU, MAXON ([id number]) Visit Report for 10/08/2020 Arrival Information Details Patient Name: Date of Service: [facility], [place] 11/01/6071 12:45 PM Medical Record Number: [id number] Patient Account Number: [id number] Date of Birth/Sex: Treating RN: Jul 09, 1965 (55 y.o. [name] Primary Care Rebakah Cokley: [name] Other Clinician: Referring Marleigh Kaylor: Treating Oza Oberle/Extender: Elwyn Lade in Treatment: 7 Visit Information History Since Last Visit Added or deleted any medications: No Patient Arrived: Ambulatory Any new allergies or adverse reactions: No Arrival Time: 13:10 Had a fall or experienced change in No Accompanied By: self activities of daily living that may affect Transfer Assistance: None risk of falls: Patient Identification Verified: Yes Signs or symptoms of abuse/neglect since last visito No Secondary Verification Process Completed: Yes Hospitalized since last visit: No Patient Requires Transmission-Based Precautions: No Implantable device outside of the clinic excluding No Patient Has Alerts: No cellular tissue based products placed in the center since last visit: Has Dressing in Place as Prescribed: Yes Has Footwear/Offloading in Place as Prescribed: Yes Left: T Contact Cast otal Pain Present Now: No Electronic Signature(s) Signed: 10/08/2020 6:03:56 PM By: Deon Pilling Entered By: Deon Pilling on 10/08/2020 13:19:57 -------------------------------------------------------------------------------- Encounter Discharge Information Details Patient Name: Date of Service: [facility], [place] 10/03/6268 12:45 PM Medical Record Number: [id number] Patient Account Number: [id number] Date of Birth/Sex: Treating RN: 02/10/1966 (55 y.o. [name] Primary Care Madalina Rosman: [name] Other Clinician: Referring Reace Breshears: Treating Ayshia Gramlich/Extender: Elwyn Lade in Treatment: 7 Encounter  Discharge Information Items Post Procedure Vitals Discharge Condition: Stable Temperature (F): 99.1 Ambulatory Status: Walker Pulse (bpm): 87 Discharge Destination: Home Respiratory Rate (breaths/min): 18 Transportation: Private Auto Blood Pressure (mmHg): 125/77 Accompanied By: self Schedule Follow-up Appointment: Yes Clinical Summary of Care: Electronic Signature(s) Signed: 10/08/2020 6:03:56 PM By: Deon Pilling Entered By: Deon Pilling on 10/08/2020 17:26:48 -------------------------------------------------------------------------------- Lower Extremity Assessment Details Patient Name: Date of Service: [facility], [place] 07/11/9369 12:45 PM Medical Record Number: [id number] Patient Account Number: [id number] Date of Birth/Sex: Treating RN: [birth year] (55 y.o. [name] Primary Care Rodrick Payson: [name] Other Clinician: Referring Hairo Garraway: Treating Markisha Meding/Extender: [name], [name] in Treatment: 7 Edema Assessment Assessed: Shirlyn Goltz: Yes] [Right: No] Edema: [Left: N] [Right: o] Calf Left: Right: Point of Measurement: 33 cm From Medial Instep 34 cm Ankle Left: Right: Point of Measurement: 8 cm From Medial Instep 20.5 cm Vascular Assessment Pulses: Dorsalis Pedis Palpable: [Left:Yes] Electronic Signature(s) Signed: 10/08/2020 6:03:56 PM By: Deon Pilling Entered By: Deon Pilling on 10/08/2020 13:20:33 -------------------------------------------------------------------------------- Multi Wound Chart Details Patient Name: Date of Service: [facility], [place] 0/06/7508 12:45 PM Medical Record Number: [id number] Patient Account Number: [id number] Date of Birth/Sex: Treating RN: 04/01/1966 (55 y.o. [name] Primary Care Anayansi Rundquist: [name] Other Clinician: Referring Josepha Barbier: Treating Skila Rollins/Extender: [name], [name] in Treatment: 7 Vital Signs Height(in): 76 Capillary Blood Glucose(mg/dl):  139 Weight(lbs): 260 Pulse(bpm): 25 Body Mass Index(BMI): 32 Blood Pressure(mmHg): 125/77 Temperature(F): 99.1 Respiratory Rate(breaths/min): 18 Photos: [6:No Photos Left Metatarsal head fourth] [N/A:N/A N/A] Wound Location: [6:Gradually Appeared] [N/A:N/A] Wounding Event: [6:Diabetic Wound/Ulcer of the Lower] [N/A:N/A] Primary Etiology: [6:Extremity Cataracts, Hypertension, Type II] [N/A:N/A] Comorbid History: [6:Diabetes, Osteomyelitis, Neuropathy 07/03/2020] [N/A:N/A] Date Acquired: [6:7] [N/A:N/A] Weeks of Treatment: [6:Open] [N/A:N/A] Wound Status: [6:0.1x0.1x0.4] [N/A:N/A] Measurements L x W x D (cm) [6:0.008] [N/A:N/A] A (cm) : rea [6:0.003] [N/A:N/A] Volume (cm) : [6:99.40%] [N/A:N/A] % Reduction in A rea: [6:99.50%] [N/A:N/A] % Reduction in Volume: [6:Grade 1] [N/A:N/A]  Classification: [6:Medium] [N/A:N/A] Exudate A mount: [6:Serosanguineous] [N/A:N/A] Exudate Type: [6:red, brown] [N/A:N/A] Exudate Color: [6:Well defined, not attached] [N/A:N/A] Wound Margin: [6:Large (67-100%)] [N/A:N/A] Granulation A mount: [6:Pink, Pale] [N/A:N/A] Granulation Quality: [6:None Present (0%)] [N/A:N/A] Necrotic A mount: [6:Fat Layer (Subcutaneous Tissue): Yes N/A] Exposed Structures: [6:Fascia: No Tendon: No Muscle: No Joint: No Bone: No Large (67-100%)] [N/A:N/A] Epithelialization: [6:Debridement - Selective/Open Wound N/A] Debridement: Pre-procedure Verification/Time Out 13:26 [N/A:N/A] Taken: [6:Callus] [N/A:N/A] Tissue Debrided: [6:Skin/Dermis] [N/A:N/A] Level: [6:0.01] [N/A:N/A] Debridement A (sq cm): [6:rea Curette] [N/A:N/A] Instrument: [6:Minimum] [N/A:N/A] Bleeding: [6:Pressure] [N/A:N/A] Hemostasis A chieved: [6:0] [N/A:N/A] Procedural Pain: [6:0] [N/A:N/A] Post Procedural Pain: [6:Procedure was tolerated well] [N/A:N/A] Debridement Treatment Response: [6:0.1x0.1x0.4] [N/A:N/A] Post Debridement Measurements L x W x D (cm) [6:0.003] [N/A:N/A] Post Debridement Volume:  (cm) [6:Debridement] [N/A:N/A] Procedures Performed: [6:T Contact Cast otal] Treatment Notes Electronic Signature(s) Signed: 10/08/2020 4:39:47 PM By: [name] MD Signed: 10/08/2020 5:39:20 PM By: [name] RN, BSN Entered By: [name] on 10/08/2020 13:37:51 -------------------------------------------------------------------------------- Multi-Disciplinary Care Plan Details Patient Name: Date of Service: [facility], [place] 08/01/5174 12:45 PM Medical Record Number: [id number] Patient Account Number: [id number] Date of Birth/Sex: Treating RN: January 17, 1966 (55 y.o. [name] Primary Care Cylan Borum: [name] Other Clinician: Referring Angelisa Winthrop: Treating Anhelica Fowers/Extender: Elwyn Lade in Treatment: 7 Multidisciplinary Care Plan reviewed with physician Active Inactive Nutrition Nursing Diagnoses: Impaired glucose control: actual or potential Potential for alteratiion in Nutrition/Potential for imbalanced nutrition Goals: Patient/caregiver agrees to and verbalizes understanding of need to use nutritional supplements and/or vitamins as prescribed Date Initiated: 08/20/2020 Date Inactivated: 09/17/2020 Target Resolution Date: 09/21/2020 Goal Status: Met Patient/caregiver will maintain therapeutic glucose control Date Initiated: 08/20/2020 Target Resolution Date: 10/19/2020 Goal Status: Active Interventions: Assess HgA1c results as ordered upon admission and as needed Assess patient nutrition upon admission and as needed per policy Provide education on elevated blood sugars and impact on wound healing Provide education on nutrition Treatment Activities: Education provided on Nutrition : 08/20/2020 Notes: Wound/Skin Impairment Nursing Diagnoses: Impaired tissue integrity Knowledge deficit related to ulceration/compromised skin integrity Goals: Patient/caregiver will verbalize understanding of skin care regimen Date Initiated:  08/20/2020 Target Resolution Date: 10/19/2020 Goal Status: Active Ulcer/skin breakdown will have a volume reduction of 30% by week 4 Date Initiated: 08/20/2020 Date Inactivated: 09/17/2020 Target Resolution Date: 09/21/2020 Goal Status: Met Interventions: Assess patient/caregiver ability to obtain necessary supplies Assess patient/caregiver ability to perform ulcer/skin care regimen upon admission and as needed Assess ulceration(s) every visit Provide education on ulcer and skin care Notes: Electronic Signature(s) Signed: 10/08/2020 5:39:20 PM By: [name] RN, BSN Entered By: [name] on 10/08/2020 13:27:36 -------------------------------------------------------------------------------- Pain Assessment Details Patient Name: Date of Service: [facility], [place]. 07/09/9483 12:45 PM Medical Record Number: [id number] Patient Account Number: [id number] Date of Birth/Sex: Treating RN: 03-05-1966 (55 y.o. [name] Primary Care Damon Baisch: [name] Other Clinician: Referring Dorthy Magnussen: Treating Kristan Brummitt/Extender: [name], [name] in Treatment: 7 Active Problems Location of Pain Severity and Description of Pain Patient Has Paino No Site Locations Rate the pain. Rate the pain. Current Pain Level: 0 Pain Management and Medication Current Pain Management: Medication: No Cold Application: No Rest: No Massage: No Activity: No T.E.N.S.: No Heat Application: No Leg drop or elevation: No Is the Current Pain Management Adequate: Adequate How does your wound impact your activities of daily livingo Sleep: No Bathing: No Appetite: No Relationship With Others: No Bladder Continence: No Emotions: No Bowel Continence: No Work: No Toileting: No  Drive: No Dressing: No Hobbies: No Electronic Signature(s) Signed: 10/08/2020 6:03:56 PM By: Deon Pilling Entered By: Deon Pilling on 10/08/2020  13:20:23 -------------------------------------------------------------------------------- Patient/Caregiver Education Details Patient Name: Date of Service: [facility], [place] 8/1/[id number]:16 PM Medical Record Number: [id number] Patient Account Number: [id number] Date of Birth/Gender: Treating RN: [birth year] (55 y.o. [name] Primary Care Physician: [name] Other Clinician: Referring Physician: Treating Physician/Extender: Elwyn Lade in Treatment: 7 Education Assessment Education Provided To: Patient Education Topics Provided Wound/Skin Impairment: Methods: Explain/Verbal Responses: State content correctly Electronic Signature(s) Signed: 10/08/2020 5:39:20 PM By: [name] RN, BSN Entered By: [name] on 10/08/2020 13:27:54 -------------------------------------------------------------------------------- Wound Assessment Details Patient Name: Date of Service: [facility], [place] 0/07/2120 12:45 PM Medical Record Number: [id number] Patient Account Number: [id number] Date of Birth/Sex: Treating RN: 1966-03-14 (55 y.o. [name] Primary Care Falisha Osment: [name] Other Clinician: Referring Zissy Hamlett: Treating Tristen Luce/Extender: [name], [name] in Treatment: 7 Wound Status Wound Number: 6 Primary Diabetic Wound/Ulcer of the Lower Extremity Etiology: Wound Location: Left Metatarsal head fourth Wound Status: Open Wounding Event: Gradually Appeared Comorbid Cataracts, Hypertension, Type II Diabetes, Osteomyelitis, Date Acquired: 07/03/2020 History: Neuropathy Weeks Of Treatment: 7 Clustered Wound: No Photos Wound Measurements Length: (cm) 0.1 Width: (cm) 0.1 Depth: (cm) 0.4 Area: (cm) 0.008 Volume: (cm) 0.003 % Reduction in Area: 99.4% % Reduction in Volume: 99.5% Epithelialization: Large (67-100%) Tunneling: No Undermining: No Wound Description Classification: Grade  1 Wound Margin: Well defined, not attached Exudate Amount: Medium Exudate Type: Serosanguineous Exudate Color: red, brown Foul Odor After Cleansing: No Slough/Fibrino No Wound Bed Granulation Amount: Large (67-100%) Exposed Structure Granulation Quality: Pink, Pale Fascia Exposed: No Necrotic Amount: None Present (0%) Fat Layer (Subcutaneous Tissue) Exposed: Yes Tendon Exposed: No Muscle Exposed: No Joint Exposed: No Bone Exposed: No Treatment Notes Wound #6 (Metatarsal head fourth) Wound Laterality: Left Cleanser Wound Cleanser Discharge Instruction: Cleanse the wound with wound cleanser prior to applying a clean dressing using gauze sponges, not tissue or cotton balls. Peri-Wound Care Topical Primary Dressing Maxorb Extra Calcium Alginate 2x2 in Discharge Instruction: Apply calcium alginate to wound bed as instructed Secondary Dressing Woven Gauze Sponges 2x2 in Discharge Instruction: Apply over primary dressing as directed. Optifoam Non-Adhesive Dressing, 4x4 in Discharge Instruction: Apply over primary dressing cut to make foam donut Secured With [employer] T 4 x 2 (in/yd) ape Discharge Instruction: Secure dressing with tape as directed. Compression Wrap Compression Stockings Add-Ons Notes TCC applied. last layer applied by MD. Electronic Signature(s) Signed: 10/09/2020 6:10:24 PM By: Deon Pilling Signed: 10/10/2020 9:46:23 AM By: Sandre Kitty Previous Signature: 10/08/2020 6:03:56 PM Version By: Deon Pilling Entered By: Sandre Kitty on 10/09/2020 16:15:28 -------------------------------------------------------------------------------- Vitals Details Patient Name: Date of Service: [facility], [place] 09/07/8889 12:45 PM Medical Record Number: [id number] Patient Account Number: [id number] Date of Birth/Sex: Treating RN: 07/20/1965 (55 y.o. [name] Primary Care Kinsley Holderman: [name] Other Clinician: Referring  Loralee Weitzman: Treating Chayce Rullo/Extender: [name], [name] in Treatment: 7 Vital Signs Time Taken: 13:10 Temperature (F): 99.1 Height (in): 76 Pulse (bpm): 87 Weight (lbs): 260 Respiratory Rate (breaths/min): 18 Body Mass Index (BMI): 31.6 Blood Pressure (mmHg): 125/77 Capillary Blood Glucose (mg/dl): 139 Reference Range: 80 - 120 mg / dl Electronic Signature(s) Signed: 10/08/2020 6:03:56 PM By: Deon Pilling Entered By: Deon Pilling on 10/08/2020 13:20:13

## 2020-10-08 NOTE — Progress Notes (Signed)
[name], [name] ([id number]) Visit Report for 10/08/2020 Debridement Details Patient Name: Date of Service: [facility], [place] 10/04/6566 12:45 PM Medical Record Number: [id number] Patient Account Number: [id number] Date of Birth/Sex: Treating RN: 12-12-1965 (55 y.o. [name] Primary Care Provider: [name] Other Clinician: Referring Provider: Treating Provider/Extender: Elwyn Lade in Treatment: 7 Debridement Performed for Assessment: Wound #6 Left Metatarsal head fourth Performed By: Physician [name]., MD Debridement Type: Debridement Severity of Tissue Pre Debridement: Fat layer exposed Level of Consciousness (Pre-procedure): Awake and Alert Pre-procedure Verification/Time Out Yes - 13:26 Taken: Start Time: 13:26 T Area Debrided (L x W): otal 0.1 (cm) x 0.1 (cm) = 0.01 (cm) Tissue and other material debrided: Non-Viable, Callus, Skin: Dermis Level: Skin/Dermis Debridement Description: Selective/Open Wound Instrument: Curette Bleeding: Minimum Hemostasis Achieved: Pressure End Time: 13:27 Procedural Pain: 0 Post Procedural Pain: 0 Response to Treatment: Procedure was tolerated well Level of Consciousness (Post- Awake and Alert procedure): Post Debridement Measurements of Total Wound Length: (cm) 0.1 Width: (cm) 0.1 Depth: (cm) 0.4 Volume: (cm) 0.003 Character of Wound/Ulcer Post Debridement: Improved Severity of Tissue Post Debridement: Fat layer exposed Post Procedure Diagnosis Same as Pre-procedure Electronic Signature(s) Signed: 10/08/2020 4:39:47 PM By: [name] MD Signed: 10/08/2020 5:39:20 PM By: [name] RN, BSN Entered By: [name] on 10/08/2020 13:38:23 -------------------------------------------------------------------------------- HPI Details Patient Name: Date of Service: [facility], [place] 12/04/9447 12:45 PM Medical Record Number: [id number] Patient Account Number:  [id number] Date of Birth/Sex: Treating RN: 1965-06-16 (55 y.o. [name] Primary Care Provider: [name] Other Clinician: Referring Provider: Treating Provider/Extender: [name], [name] in Treatment: 7 History of Present Illness HPI Description: ADMISSION 05/04/2018 This is a 55 year old man who apparently is a type I diabetic confirmed by appropriate serology testing 6 years ago. He is therefore on insulin. His most recent hemoglobin A1c was 10.7 on 03/27/2018. 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 As a result of a combination of this he is insensate and even has reduced sensation in his upper extremities. His current problem started in early July he noted a blister over the left fourth plantar metatarsal head. He saw [name] his podiatrist on 01/07/2018. At that time he had a wound that measured 2 x 1 x 0.4 cm. An x-ray did not show osteomyelitis. It did not appear that he actually followed up. He was admitted to hospital on 03/25/2018 through 03/30/2018 with his left foot painful and swollen. He underwent a surgical IandD by [name]. I believe his culture at that time showed Enterococcus faecalis and a bone culture showed enterococcus avium. He is been reviewed by [name] of infectious disease although I have not reviewed his note and he has been on 6 weeks worth of ertapenem which should finish sometime apparently on December 9.. MRI suggested possibility of septic arthritis but did not comment specifically on osteomyelitis. A more recent wound culture was negative. The patient has been referred here for consideration among other things of hyperbaric oxygen versus proceeding with a partial ray amputation. This would involve excision of the fourth metatarsal head. The patient is offloading  this and a Darco forefoot off loader. I think he is using iodoform packing. 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 Certainly would be a possibility of a total contact cast. I  did not see any arterial studies on him however his peripheral pulses are palpable 05/11/2018; patient started his hyperbarics on Friday but did not dive yesterday for personal issues. He saw [name] on 12//19 he notes that during the hospitalization in October 2019 he had an abscess and underwent a bone biopsy with culture that showed group B strep, enterococcus avium and Enterococcus faecalis. There was Prevotella from the abscess. He was put on ertapenem for 6 weeks which he completed. Per [name] notable for the fact that he is not been treated with ampicillin or amoxicillin. His hemoglobin A1c was noted to be high at 10.7. He is started on ampicillin sulbactam every 6 again I think this is for better coverage for the enterococcus. We started him on silver alginate as there is still purulent looking drainage coming out of the wound and there is indeed exposed bone. The patient has not been unwell 05/18/2018; patient continues with hyperbarics he is tolerating this well and has no complaints. He is on Unasyn every 6. We have been using silver alginate to the area over the fourth plantar metatarsal head. He is a poorly controlled diabetic. There is certainly less purulent drainage than when I first saw this man at the beginning of the month. He only sedimentation rate I see on him was 40 on 03/26/2018. States his blood sugars are under better control in the 120s to 130 range. Follows with his endocrinologist in [facility] I change his primary dressing to Prisma today attempting to increase the granulation. After I left the room he called [name] back into look at a area that he noticed on the right foot 2 days ago. Very superficial and in the medial aspect of his high plantar arch [secondary to his  neuropathy] he has a custom insert in the shoe on the right foot. 05/24/2018; continues with HBO doing well. He saw infectious disease today and is still on Unasyn every 6 hours. Not making a lot of progress on the left foot with Prisma I changed him to silver alginate strips. The new area on the right midfoot he is putting silver alginate on this as well. 06/01/2018; patient remains on Unasyn. Not making a lot of progress with granulation. I am going to put him in a total contact cast use Prisma for now. He has a high co-pay for an advanced treatment option but I suspect feeling in this wound may come to that 06/04/2018; patient back for the obligatory first total contact cast change. We continue to use Prisma 06/08/2018; the patient is tolerating the cast well although he had some loosening over the tibia which rubbed when he walked. There is no lesion here. We have been using Prisma under the total contact cast. He is in hyperbarics which she is tolerating well 06/15/2018; tolerating total contact cast and hyperbarics well. He continues IV antibiotics and for the underlying osteomyelitis as directed by infectious disease. His wound is making really nice progress 1/21; Patient's wound is down in depth. Using silver collagen. Ab's end this week and Pic line is to be d/ced 1/28; 0.8 cm in depth which is more than last time. We have been using silver collagen under total contact cast. He continues in hyperbarics. His IV antibiotics are finished PICC line has been removed 2/4; surprisingly today the area is actually epithelialized. There is no open area. There is however a divot where the epithelium is gone down to close the wound surface. I am not completely convinced  that this is going to remain viable over time but I think time is the best thing to determine this. There is no evidence of surrounding infection. As of today I do not think a repeat MRI or any other procedure is going to be indicative of  whether this is going to maintain a closed state. I am going to continue him in a total contact cast. He will complete HBO. Next week I will allow him perhaps to graduate into a Darco forefoot off loader, then a healing sandal. I will then send him back to podiatry to see if they can fashion an offloading shoe if this area remains closed 2/10; the patient was taken out of the cast today the area is healed. He has a divot however this is fully epithelialized and the epithelialized tissue looks healthy. I have put him into a Darco forefoot off loader. He will complete his last 2 hyperbaric treatments. I will see him next week and I may transition him into a surgical shoe versus his own shoe. He is following up with [name] of podiatry and I think a diabetic shoe with custom inserts could be provided with the signature provided by his endocrinologist ADMISSION 6/22 CLINICAL DATA: Foot ulcer, question of osteomyelitis EXAM: MRI OF THE LEFT FOOT WITHOUT CONTRAST TECHNIQUE: Multiplanar, multisequence MR imaging of the left was performed. No intravenous contrast was administered. COMPARISON: Radiograph same day FINDINGS: Bones/Joint/Cartilage Small cystic changes are seen at the fifth metatarsal head, likely degenerative changes. No marrow edema, osseous fracture, or periosteal reaction are seen throughout the osseous structures. Small joint effusion seen at the fifth MTP joint. There appears to be chronic cortical irregularity with erosive type change seen at the fourth metatarsal head which is unchanged since June 28, 2019, however new since 2019. Ligaments The Lisfranc ligaments and collateral ligaments appear to be intact. Muscles and Tendons Diffusely increased signal seen throughout the muscles surrounding the forefoot with fatty atrophy. The flexor and extensor tendons appear to be intact. The plantar fascia is intact. Soft tissues Along the dorsal lateral aspect of the fifth  metatarsal shaft there is a focal area superficial ulceration measuring approximately 8 mm in transverse dimension. There is overlying skin thickening and subcutaneous edema seen. No sinus tract or loculated fluid collection is noted. IMPRESSION: 1. Small focal area of ulceration along the dorsal lateral aspect of the fifth metatarsal shaft. No definite evidence of acute osteomyelitis, sinus tract, or abscess. 2. Progressive chronic erosive cortical destruction seen at the fourth metatarsal head since 2019, however unchanged since June 28, 2019, which could be from chronic osteomyelitis or prior injury. 3. Small joint effusion at the fifth MTP joint. Electronically Signed By: Prudencio Pair M.D. On: 08/02/2019 59:16 55 year old type I diabetic who we had in clinic in late 2019 to February 2020 with osteomyelitis of his left fourth metatarsal head and a wound in this area. This eventually healed with IV antibiotics and hyperbaric oxygen. He was relatively well until February of this year when he developed I believe 2 wounds on the left fifth met head 1 plantar and 1 more laterally. The one more laterally has since closed over but he has been left with a fairly persistent wound in the plantar left fifth met head. Has been following with [name] of podiatry. For an extended period of time he used Medihoney. He received doxycycline and April at which time a culture showed E. coli and MSSA. An MRI done on August 02, 2019 [place]  above] did not show osteomyelitis but did suggest some ongoing destruction at the fourth metatarsal head. He was switched to silver alginate as the primary dressing in May. Earlier this month he received a 2-week course of Bactrim due to erythema around the wound. He was referred to Dr. Alfred Levins of infectious disease who did not think anything was infected and recommended stopping antibiotics. The patient said he felt really well has you finished up the Bactrim but then  yesterday morning he became febrile again and noted more erythema around the wound. He was referred here for our review of this. His temperature today is 99.3. He denies any other infectious symptoms no cough no upper respiratory, no dysuria etc. He has been using silver alginate and offloading this area with a surgical shoe. 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 He apparently had shoulder surgery in the fall 2020 I did not look this up he apparently has hardware in this area. Recent x-ray done in Dr. Pasty Arch office on 6/7 was negative for osteo- ABIs have always been noncompressible. Is not previously been thought that arterial insufficiency is contributing to this. 6/29; patient returns the clinic. I thought he might be going to see Dr. Earleen Newport ongoing for this wound but he is back today. He did see Dr. Earleen Newport on 11/22/2019 I believe he agreed with the MRI and apparently was making some arrangements to have this done in [facility] which I am fine with if they can compare with the study of March 2. In any case he returns with a clinic of the wound is not as good. He has been using silver alginate. He is still on another antibiotic that he says Dr. Earleen Newport prescribed 7/8; the patient had his MRI which showed a progressive soft tissue ulceration plantar to the fifth MTP joint with increased soft tissue enhancement throughout the small toe no abscess was noted. There was progressive signal changes in the fifth metatarsal head and fifth proximal phalanx without cortical destruction or definite abnormal T1 signal. These findings are nonspecific could be secondary to hyperemia or early osteomyelitis. He is on amoxicillin as directed by Dr. Earleen Newport. There were plans to do a bone biopsy and culture done in the OR on Friday however the patient canceled this because  of insurance issues [surgical center not in [place]. In any case I have told him that this procedure in my opinion needs to be done. He is using Silvadene cream READMISSION 02/07/2020 This is a patient who had a progressive ulceration over the fifth MTP. We had him here in June and into the early July. He was also followed by [name] of podiatry. The day after we last saw him on 7/9 he went underwent a excision of the fifth MTP to aid in wound healing over this area. Pathology did not show osteomyelitis. Since then he has been following with Dr. Earleen Newport. Notable that earlier this month he presented with worse wound with purulence. CandS showed MSSA and he was treated with doxycycline and Silvadene cream. More recently he has been changed to Augmentin. He is using Betadine to the wound. He has a forefoot offloading shoe at some point in the postoperative. This wound opened in the surgical line. He is here for our review of this. With regards to his diabetes I spent some time on this today. This was diagnosed in his mid  40s he was on pills for a while and then on insulin. When he was here last time he told me that he thought he had had antipancreatic antibodies that showed he was type I if so I do not see this. He followed for a period of time with Dr. Meredith Pel endocrinology at [facility] who I gather is currently independent practice in [facility]. I have looked through her notes as it applies to what is available in care everywhere and she consistently calls him a type II diabetic. If he had other studies of his diabetes I do not see this. I have therefore changed my label of him to type II ABI in our clinic was 1.41 on the left 9/13; arrives in clinic today with things not looking as good. He has a small linear wound in the incision line but nonviable rolled edges and debris around the circumference. As well it looks as though he had a deep tissue injury over the base of the fifth metatarsal.  Raised may be blistered but no open area here. Also concerning erythema around the wound both distally and proximally 9/20; PCR culture I did last time showed high Enterococcus faecalis, medium MRSA and 3 different anaerobic's including medium Peptostreptococcus magnus. I gave him a combination of Augmentin and trimethoprim sulfamethoxazole starting on 9/16 for 10 days. He is tolerating these well He comes in today. His original wound area at the surgical site only has a small crevice we are using endoform here. Not surprisingly the area over the base of the fifth metatarsal has open with a superficial wound. He has a wrap/surgical shoe injury dorsally on the left foot he is asking about a total contact cast and I think what I know about this area suggests that the lateral part of his foot is a weightbearing surface and I think we could certainly justify this. 9/27; patient is finishing his antibiotics. His surgical wound is actually closed the area over the base of the left fifth metatarsal of is also closed but still with some eschar. 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 As such he actually walks on the outside of his foot. He has been in touch with [facility] Liliane Channel is their orthotic specialist READMISSION 08/20/2020 This is a 55 year old man who is a type II diabetic. 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 He has been in this clinic before with wounds over his left fifth and left fourth met head. He has a history of osteomyelitis in the left fifth met head requiring an excision of the fifth MTP.. The patient tells [name] that he has an AFO brace now to correct the ankle deformity. Sometime in February he developed a callus over the fourth metatarsal head ultimately Dr. Earleen Newport open this area to expose the wound. He used Silvadene cream for a while most recently  Promogran. He has had antibiotics as well including doxycycline and apparently currently Augmentin although I am not certain there is been any cultures. He states the area has not been x-rayed. 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 The met head here is subluxed. His x-ray that we ordered last week was negative for osteomyelitis.  Clinically there does not appear to be infection here. No erythema no drainage the wound does not probe to bone 4/4; put a total contact cast on him last week primary dressing of silver collagen he arrives in clinic today with a wound measuring larger and as opposed to last week a direct probe to bone. He is definitely going to need an MRI 4/11; miraculously the divot the probe to bone his filled in this week. Unfortunately his MRI is not till next Monday. We have been using silver collagen under a total contact cast. He has completed his antibiotics 4/18; no palpable bone. The distal half of this wound is has depth but there is no exposed bone. More proximal is at the level of the surrounding skin. His MRI is being delayed because of insurance issues was supposed to be today then Wednesday and now we are not exactly sure what the date is or what the issue is. In general things look somewhat better. We have been using silver collagen under the total contact cast 4/27; patient presents for removal and placement of TCC. He has tolerated the cast well in the past week. He has no complaints today. This MRI is still pending scheduling. We will not use a silver dressing so in case it does get scheduled he can get this done without issue. 5/2; patient's wound is smaller but with undermining. We finally have approval for his MRI which has been going on for a month or more now. 5/9 the wound  is smaller but with above 0.4 cm of depth we still do not have an MRI. No evidence of continued infection. He is tolerating the total contact cast well Electronic Signature(s) Signed: 10/08/2020 4:39:47 PM By: [name] MD Entered By: [name] on 10/08/2020 13:40:05 -------------------------------------------------------------------------------- Physical Exam Details Patient Name: Date of Service: [facility] [place] 11/09/6787 12:45 PM Medical Record Number: [id number] Patient Account Number: [id number] Date of Birth/Sex: Treating RN: 20-Sep-1965 (55 y.o. [name] Primary Care Provider: [name] Other Clinician: Referring Provider: Treating Provider/Extender: [name], [name] in Treatment: 7 Notes Wound exam; left foot, left fourth metatarsal head wound is smaller but still with depth of 0.4 cm I removed surrounding skin but I do not know that there is much undermining after this. Electronic Signature(s) Signed: 10/08/2020 4:39:47 PM By: [name] MD Entered By: [name] on 10/08/2020 13:41:15 -------------------------------------------------------------------------------- Physician Orders Details Patient Name: Date of Service: [facility], [place] 07/07/8525 12:45 PM Medical Record Number: [id number] Patient Account Number: [id number] Date of Birth/Sex: Treating RN: 09/22/1965 (55 y.o. [name] Primary Care Provider: [name] Other Clinician: Referring Provider: Treating Provider/Extender: Elwyn Lade in Treatment: 7 Verbal / Phone Orders: No Diagnosis Coding ICD-10 Coding Code Description E11.621 Type 2 diabetes mellitus with foot ulcer L97.528 Non-pressure chronic ulcer of other part of left foot with other specified severity Follow-up Appointments ppointment in 1 week. - Dr. Dellia Nims Return A Bathing/ Shower/ Hygiene May shower with protection but do not get wound dressing(s)  wet. Off-Loading Total Contact Cast to Left Lower Extremity Additional Orders / Instructions Other: - Call to schedule appt for MRI Wound Treatment Wound #6 - Metatarsal head fourth Wound Laterality: Left Cleanser: Wound Cleanser 1 x Per Week/7 Days Discharge Instructions: Cleanse the wound with wound cleanser prior to applying a clean dressing using gauze sponges, not tissue or cotton balls. Prim Dressing: Maxorb Extra Calcium Alginate 2x2 in 1 x Per Week/7  Days ary Discharge Instructions: Apply calcium alginate to wound bed as instructed Secondary Dressing: Woven Gauze Sponges 2x2 in (Generic) 1 x Per Week/7 Days Discharge Instructions: Apply over primary dressing as directed. Secondary Dressing: Optifoam Non-Adhesive Dressing, 4x4 in 1 x Per Week/7 Days Discharge Instructions: Apply over primary dressing cut to make foam donut Secured With: 37M Medipore H Soft Cloth Surgical T 4 x 2 (in/yd) 1 x Per Week/7 Days ape Discharge Instructions: Secure dressing with tape as directed. Electronic Signature(s) Signed: 10/08/2020 4:39:47 PM By: [name] MD Signed: 10/08/2020 5:39:20 PM By: [name] RN, BSN Entered By: [name] on 10/08/2020 13:27:27 -------------------------------------------------------------------------------- Problem List Details Patient Name: Date of Service: [facility], [place] 11/30/6965 12:45 PM Medical Record Number: [id number] Patient Account Number: [id number] Date of Birth/Sex: Treating RN: 1966-04-25 (55 y.o. [name] Primary Care Provider: [name] Other Clinician: Referring Provider: Treating Provider/Extender: [name], [name] in Treatment: 7 Active Problems ICD-10 Encounter Code Description Active Date MDM Diagnosis E11.621 Type 2 diabetes mellitus with foot ulcer 08/20/2020 No Yes L97.528 Non-pressure chronic ulcer of other part of left foot with other specified 08/20/2020 No Yes severity Inactive  Problems Resolved Problems Electronic Signature(s) Signed: 10/08/2020 4:39:47 PM By: [name] MD Entered By: [name] on 10/08/2020 13:37:43 -------------------------------------------------------------------------------- Progress Note Details Patient Name: Date of Service: [facility], [place] 1/0/2585 12:45 PM Medical Record Number: [id number] Patient Account Number: [id number] Date of Birth/Sex: Treating RN: 1966-02-02 (55 y.o. [name] Primary Care Provider: [name] Other Clinician: Referring Provider: Treating Provider/Extender: [name], [name] in Treatment: 7 Subjective History of Present Illness (HPI) ADMISSION 05/04/2018 This is a 55 year old man who apparently is a type I diabetic confirmed by appropriate serology testing 6 years ago. He is therefore on insulin. His most recent hemoglobin A1c was 10.7 on 03/27/2018. 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 As a result of a combination of this he is insensate and even has reduced sensation in his upper extremities. His current problem started in early July he noted a blister over the left fourth plantar metatarsal head. He saw [name] his podiatrist on 01/07/2018. At that time he had a wound that measured 2 x 1 x 0.4 cm. An x-ray did not show osteomyelitis. It did not appear that he actually followed up. He was admitted to hospital on 03/25/2018 through 03/30/2018 with his left foot painful and swollen. He underwent a surgical IandD by [name]. I believe his culture at that time showed Enterococcus faecalis and a bone culture showed enterococcus avium. He is been reviewed by [name] of infectious disease although I have not reviewed his note and he has been on 6 weeks worth of ertapenem which should finish sometime  apparently on December 9.. MRI suggested possibility of septic arthritis but did not comment specifically on osteomyelitis. A more recent wound culture was negative. The patient has been referred here for consideration among other things of hyperbaric oxygen versus proceeding with a partial ray amputation. This would involve excision of the fourth metatarsal head. The patient is offloading this and a Darco forefoot off loader. I think he is using iodoform packing. 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 Certainly would be a possibility of a total contact cast. I did not see any arterial studies on him  however his peripheral pulses are palpable 05/11/2018; patient started his hyperbarics on Friday but did not dive yesterday for personal issues. He saw [name] on 12//19 he notes that during the hospitalization in October 2019 he had an abscess and underwent a bone biopsy with culture that showed group B strep, enterococcus avium and Enterococcus faecalis. There was Prevotella from the abscess. He was put on ertapenem for 6 weeks which he completed. Per [name] notable for the fact that he is not been treated with ampicillin or amoxicillin. His hemoglobin A1c was noted to be high at 10.7. He is started on ampicillin sulbactam every 6 again I think this is for better coverage for the enterococcus. We started him on silver alginate as there is still purulent looking drainage coming out of the wound and there is indeed exposed bone. The patient has not been unwell 05/18/2018; patient continues with hyperbarics he is tolerating this well and has no complaints. He is on Unasyn every 6. We have been using silver alginate to the area over the fourth plantar metatarsal head. He is a poorly controlled diabetic. There is certainly less purulent drainage than when I first saw this man at the beginning of the month. He only sedimentation rate I see on him was 40 on 03/26/2018. States  his blood sugars are under better control in the 120s to 130 range. Follows with his endocrinologist in [facility] I change his primary dressing to Prisma today attempting to increase the granulation. After I left the room he called [name] back into look at a area that he noticed on the right foot 2 days ago. Very superficial and in the medial aspect of his high plantar arch [secondary to his neuropathy] he has a custom insert in the shoe on the right foot. 05/24/2018; continues with HBO doing well. He saw infectious disease today and is still on Unasyn every 6 hours. Not making a lot of progress on the left foot with Prisma I changed him to silver alginate strips. The new area on the right midfoot he is putting silver alginate on this as well. 06/01/2018; patient remains on Unasyn. Not making a lot of progress with granulation. I am going to put him in a total contact cast use Prisma for now. He has a high co-pay for an advanced treatment option but I suspect feeling in this wound may come to that 06/04/2018; patient back for the obligatory first total contact cast change. We continue to use Prisma 06/08/2018; the patient is tolerating the cast well although he had some loosening over the tibia which rubbed when he walked. There is no lesion here. We have been using Prisma under the total contact cast. He is in hyperbarics which she is tolerating well 06/15/2018; tolerating total contact cast and hyperbarics well. He continues IV antibiotics and for the underlying osteomyelitis as directed by infectious disease. His wound is making really nice progress 1/21; Patient's wound is down in depth. Using silver collagen. Ab's end this week and Pic line is to be d/ced 1/28; 0.8 cm in depth which is more than last time. We have been using silver collagen under total contact cast. He continues in hyperbarics. His IV antibiotics are finished PICC line has been removed 2/4; surprisingly today the area is actually  epithelialized. There is no open area. There is however a divot where the epithelium is gone down to close the wound surface. I am not completely convinced that this is going to remain viable  over time but I think time is the best thing to determine this. There is no evidence of surrounding infection. As of today I do not think a repeat MRI or any other procedure is going to be indicative of whether this is going to maintain a closed state. I am going to continue him in a total contact cast. He will complete HBO. Next week I will allow him perhaps to graduate into a Darco forefoot off loader, then a healing sandal. I will then send him back to podiatry to see if they can fashion an offloading shoe if this area remains closed 2/10; the patient was taken out of the cast today the area is healed. He has a divot however this is fully epithelialized and the epithelialized tissue looks healthy. I have put him into a Darco forefoot off loader. He will complete his last 2 hyperbaric treatments. I will see him next week and I may transition him into a surgical shoe versus his own shoe. He is following up with [name] of podiatry and I think a diabetic shoe with custom inserts could be provided with the signature provided by his endocrinologist ADMISSION 6/22 CLINICAL DATA: Foot ulcer, question of osteomyelitis EXAM: MRI OF THE LEFT FOOT WITHOUT CONTRAST TECHNIQUE: Multiplanar, multisequence MR imaging of the left was performed. No intravenous contrast was administered. COMPARISON: Radiograph same day FINDINGS: Bones/Joint/Cartilage Small cystic changes are seen at the fifth metatarsal head, likely degenerative changes. No marrow edema, osseous fracture, or periosteal reaction are seen throughout the osseous structures. Small joint effusion seen at the fifth MTP joint. There appears to be chronic cortical irregularity with erosive type change seen at the fourth metatarsal head which is unchanged  since June 28, 2019, however new since 2019. Ligaments The Lisfranc ligaments and collateral ligaments appear to be intact. Muscles and Tendons Diffusely increased signal seen throughout the muscles surrounding the forefoot with fatty atrophy. The flexor and extensor tendons appear to be intact. The plantar fascia is intact. Soft tissues Along the dorsal lateral aspect of the fifth metatarsal shaft there is a focal area superficial ulceration measuring approximately 8 mm in transverse dimension. There is overlying skin thickening and subcutaneous edema seen. No sinus tract or loculated fluid collection is noted. IMPRESSION: 1. Small focal area of ulceration along the dorsal lateral aspect of the fifth metatarsal shaft. No definite evidence of acute osteomyelitis, sinus tract, or abscess. 2. Progressive chronic erosive cortical destruction seen at the fourth metatarsal head since 2019, however unchanged since June 28, 2019, which could be from chronic osteomyelitis or prior injury. 3. Small joint effusion at the fifth MTP joint. Electronically Signed By: Prudencio Pair M.D. On: 08/02/2019 35:3 55 year old type I diabetic who we had in clinic in late 2019 to February 2020 with osteomyelitis of his left fourth metatarsal head and a wound in this area. This eventually healed with IV antibiotics and hyperbaric oxygen. He was relatively well until February of this year when he developed I believe 2 wounds on the left fifth met head 1 plantar and 1 more laterally. The one more laterally has since closed over but he has been left with a fairly persistent wound in the plantar left fifth met head. Has been following with [name] of podiatry. For an extended period of time he used Medihoney. He received doxycycline and April at which time a culture showed E. coli and MSSA. An MRI done on August 02, 2019 [shown above] did not show osteomyelitis but did suggest  some ongoing destruction at the  fourth metatarsal head. He was switched to silver alginate as the primary dressing in May. Earlier this month he received a 2-week course of Bactrim due to erythema around the wound. He was referred to Dr. Alfred Levins of infectious disease who did not think anything was infected and recommended stopping antibiotics. The patient said he felt really well has you finished up the Bactrim but then yesterday morning he became febrile again and noted more erythema around the wound. He was referred here for our review of this. His temperature today is 99.3. He denies any other infectious symptoms no cough no upper respiratory, no dysuria etc. He has been using silver alginate and offloading this area with a surgical shoe. 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 He apparently had shoulder surgery in the fall 2020 I did not look this up he apparently has hardware in this area. Recent x-ray done in Dr. Pasty Arch office on 6/7 was negative for osteo- ABIs have always been noncompressible. Is not previously been thought that arterial insufficiency is contributing to this. 6/29; patient returns the clinic. I thought he might be going to see Dr. Earleen Newport ongoing for this wound but he is back today. He did see Dr. Earleen Newport on 11/22/2019 I believe he agreed with the MRI and apparently was making some arrangements to have this done in [facility] which I am fine with if they can compare with the study of March 2. In any case he returns with a clinic of the wound is not as good. He has been using silver alginate. He is still on another antibiotic that he says Dr. Earleen Newport prescribed 7/8; the patient had his MRI which showed a progressive soft tissue ulceration plantar to the fifth MTP joint with increased soft tissue enhancement throughout the small toe no abscess was noted. There was  progressive signal changes in the fifth metatarsal head and fifth proximal phalanx without cortical destruction or definite abnormal T1 signal. These findings are nonspecific could be secondary to hyperemia or early osteomyelitis. He is on amoxicillin as directed by Dr. Earleen Newport. There were plans to do a bone biopsy and culture done in the OR on Friday however the patient canceled this because of insurance issues [surgical center not in [place]. In any case I have told him that this procedure in my opinion needs to be done. He is using Silvadene cream READMISSION 02/07/2020 This is a patient who had a progressive ulceration over the fifth MTP. We had him here in June and into the early July. He was also followed by [name] of podiatry. The day after we last saw him on 7/9 he went underwent a excision of the fifth MTP to aid in wound healing over this area. Pathology did not show osteomyelitis. Since then he has been following with Dr. Earleen Newport. Notable that earlier this month he presented with worse wound with purulence. CandS showed MSSA and he was treated with doxycycline and Silvadene cream. More recently he has been changed to Augmentin. He is using Betadine to the wound. He has a forefoot offloading shoe at some point in the postoperative. This wound opened in the surgical line. He is here for our review of this. With regards to his diabetes I spent some time on this today. This was diagnosed in his mid 27s he was on pills for a while  and then on insulin. When he was here last time he told me that he thought he had had antipancreatic antibodies that showed he was type I if so I do not see this. He followed for a period of time with Dr. Meredith Pel endocrinology at [facility] who I gather is currently independent practice in [facility]. I have looked through her notes as it applies to what is available in care everywhere and she consistently calls him a type II diabetic. If he had other studies of his  diabetes I do not see this. I have therefore changed my label of him to type II ABI in our clinic was 1.41 on the left 9/13; arrives in clinic today with things not looking as good. He has a small linear wound in the incision line but nonviable rolled edges and debris around the circumference. As well it looks as though he had a deep tissue injury over the base of the fifth metatarsal. Raised may be blistered but no open area here. Also concerning erythema around the wound both distally and proximally 9/20; PCR culture I did last time showed high Enterococcus faecalis, medium MRSA and 3 different anaerobic's including medium Peptostreptococcus magnus. I gave him a combination of Augmentin and trimethoprim sulfamethoxazole starting on 9/16 for 10 days. He is tolerating these well He comes in today. His original wound area at the surgical site only has a small crevice we are using endoform here. Not surprisingly the area over the base of the fifth metatarsal has open with a superficial wound. He has a wrap/surgical shoe injury dorsally on the left foot he is asking about a total contact cast and I think what I know about this area suggests that the lateral part of his foot is a weightbearing surface and I think we could certainly justify this. 9/27; patient is finishing his antibiotics. His surgical wound is actually closed the area over the base of the left fifth metatarsal of is also closed but still with some eschar. 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 As such he actually walks on the outside of his foot. He has been in touch with [facility] Liliane Channel is their orthotic specialist READMISSION 08/20/2020 This is a 55 year old man who is a type II diabetic. 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 He has been in this clinic before with wounds over his left fifth and  left fourth met head. He has a history of osteomyelitis in the left fifth met head requiring an excision of the fifth MTP.. The patient tells [name] that he has an AFO brace now to correct the ankle deformity. Sometime in February he developed a callus over the fourth metatarsal head ultimately Dr. Earleen Newport open this area to expose the wound. He used Silvadene cream for a while most recently Promogran. He has had antibiotics as well including doxycycline and apparently currently Augmentin although I am not certain there is been any cultures. He states the area has not been x-rayed. 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 The met head here is subluxed. His x-ray that we ordered last week was negative for osteomyelitis. Clinically there does not appear to be infection  here. No erythema no drainage the wound does not probe to bone 4/4; put a total contact cast on him last week primary dressing of silver collagen he arrives in clinic today with a wound measuring larger and as opposed to last week a direct probe to bone. He is definitely going to need an MRI 4/11; miraculously the divot the probe to bone his filled in this week. Unfortunately his MRI is not till next Monday. We have been using silver collagen under a total contact cast. He has completed his antibiotics 4/18; no palpable bone. The distal half of this wound is has depth but there is no exposed bone. More proximal is at the level of the surrounding skin. His MRI is being delayed because of insurance issues was supposed to be today then Wednesday and now we are not exactly sure what the date is or what the issue is. In general things look somewhat better. We have been using silver collagen under the total contact cast 4/27; patient presents for removal and  placement of TCC. He has tolerated the cast well in the past week. He has no complaints today. This MRI is still pending scheduling. We will not use a silver dressing so in case it does get scheduled he can get this done without issue. 5/2; patient's wound is smaller but with undermining. We finally have approval for his MRI which has been going on for a month or more now. 5/9 the wound is smaller but with above 0.4 cm of depth we still do not have an MRI. No evidence of continued infection. He is tolerating the total contact cast well Objective Constitutional Vitals Time Taken: 1:10 PM, Height: 76 in, Weight: 260 lbs, BMI: 31.6, Temperature: 99.1 F, Pulse: 87 bpm, Respiratory Rate: 18 breaths/min, Blood Pressure: 125/77 mmHg, Capillary Blood Glucose: 139 mg/dl. Integumentary (Hair, Skin) Wound #6 status is Open. Original cause of wound was Gradually Appeared. The date acquired was: 07/03/2020. The wound has been in treatment 7 weeks. The wound is located on the Left Metatarsal head fourth. The wound measures 0.1cm length x 0.1cm width x 0.4cm depth; 0.008cm^2 area and 0.003cm^3 volume. There is Fat Layer (Subcutaneous Tissue) exposed. There is no tunneling or undermining noted. There is a medium amount of serosanguineous drainage noted. The wound margin is well defined and not attached to the wound base. There is large (67-100%) pink, pale granulation within the wound bed. There is no necrotic tissue within the wound bed. Assessment Active Problems ICD-10 Type 2 diabetes mellitus with foot ulcer Non-pressure chronic ulcer of other part of left foot with other specified severity Procedures Wound #6 Pre-procedure diagnosis of Wound #6 is a Diabetic Wound/Ulcer of the Lower Extremity located on the Left Metatarsal head fourth .Severity of Tissue Pre Debridement is: Fat layer exposed. There was a Selective/Open Wound Skin/Dermis Debridement with a total area of 0.01 sq cm performed by  [name]., MD. With the following instrument(s): Curette to remove Non-Viable tissue/material. Material removed includes Callus and Skin: Dermis and. No specimens were taken. A time out was conducted at 13:26, prior to the start of the procedure. A Minimum amount of bleeding was controlled with Pressure. The procedure was tolerated well with a pain level of 0 throughout and a pain level of 0 following the procedure. Post Debridement Measurements: 0.1cm length x 0.1cm width x 0.4cm depth; 0.003cm^3 volume. Character of Wound/Ulcer Post Debridement is improved. Severity of Tissue Post Debridement is: Fat layer exposed. Post procedure Diagnosis  Wound #6: Same as Pre-Procedure Pre-procedure diagnosis of Wound #6 is a Diabetic Wound/Ulcer of the Lower Extremity located on the Left Metatarsal head fourth . There was a T Contact otal Cast Procedure by [name]., MD. Post procedure Diagnosis Wound #6: Same as Pre-Procedure Plan Follow-up Appointments: Return Appointment in 1 week. - Dr. Arcola Jansky Shower/ Hygiene: May shower with protection but do not get wound dressing(s) wet. Off-Loading: T Contact Cast to Left Lower Extremity otal Additional Orders / Instructions: Other: - Call to schedule appt for MRI WOUND #6: - Metatarsal head fourth Wound Laterality: Left Cleanser: Wound Cleanser 1 x Per Week/7 Days Discharge Instructions: Cleanse the wound with wound cleanser prior to applying a clean dressing using gauze sponges, not tissue or cotton balls. Prim Dressing: Maxorb Extra Calcium Alginate 2x2 in 1 x Per Week/7 Days ary Discharge Instructions: Apply calcium alginate to wound bed as instructed Secondary Dressing: Woven Gauze Sponges 2x2 in (Generic) 1 x Per Week/7 Days Discharge Instructions: Apply over primary dressing as directed. Secondary Dressing: Optifoam Non-Adhesive Dressing, 4x4 in 1 x Per Week/7 Days Discharge Instructions: Apply over primary dressing cut to  make foam donut Secured With: 68M Medipore H Soft Cloth Surgical T 4 x 2 (in/yd) 1 x Per Week/7 Days ape Discharge Instructions: Secure dressing with tape as directed. 1. Silver alginate into the total contact cast to continue. Hopefully we get some improvement in the depth 2. He is to call the MRI Electronic Signature(s) Signed: 10/08/2020 4:39:47 PM By: [name] MD Entered By: [name] on 10/08/2020 13:41:53 -------------------------------------------------------------------------------- Total Contact Cast Details Patient Name: Date of Service: [facility], [place] 0/07/4740 12:45 PM Medical Record Number: [id number] Patient Account Number: [id number] Date of Birth/Sex: Treating RN: [birth year] (55 y.o. [name] Primary Care Provider: [name] Other Clinician: Referring Provider: Treating Provider/Extender: Elwyn Lade in Treatment: 7 T Contact Cast Applied for Wound Assessment: otal Wound #6 Left Metatarsal head fourth Performed By: Physician [name]., MD Post Procedure Diagnosis Same as Pre-procedure Electronic Signature(s) Signed: 10/08/2020 4:39:47 PM By: [name] MD Signed: 10/08/2020 5:39:20 PM By: [name] RN, BSN Entered By: [name] on 10/08/2020 13:29:43 -------------------------------------------------------------------------------- SuperBill Details Patient Name: Date of Service: [facility], [place] 09/02/3293 Medical Record Number: [id number] Patient Account Number: [id number] Date of Birth/Sex: Treating RN: June 23, 1965 (55 y.o. [name] Primary Care Provider: [name] Other Clinician: Referring Provider: Treating Provider/Extender: [name], [name] in Treatment: 7 Diagnosis Coding ICD-10 Codes Code Description E11.621 Type 2 diabetes mellitus with foot ulcer L97.528 Non-pressure chronic ulcer of other part of left foot with other specified  severity Facility Procedures CPT4 Code: [id number] Description: [phone] - DEBRIDE WOUND 1ST 20 SQ CM OR < ICD-10 Diagnosis Description E11.621 Type 2 diabetes mellitus with foot ulcer L97.528 Non-pressure chronic ulcer of other part of left foot with other specified severi Modifier: ty Quantity: 1 Physician Procedures : CPT4 Code Description Modifier [id number] - WC PHYS DEBR WO ANESTH 20 SQ CM ICD-10 Diagnosis Description E11.621 Type 2 diabetes mellitus with foot ulcer L97.528 Non-pressure chronic ulcer of other part of left foot with other specified severity Quantity: 1 Electronic Signature(s) Signed: 10/08/2020 4:39:47 PM By: [name] MD Entered By: [name] on 10/08/2020 13:42:08

## 2020-10-15 ENCOUNTER — Other Ambulatory Visit: Payer: Self-pay

## 2020-10-15 ENCOUNTER — Encounter (HOSPITAL_BASED_OUTPATIENT_CLINIC_OR_DEPARTMENT_OTHER): Payer: BLUE CROSS/BLUE SHIELD | Admitting: Internal Medicine

## 2020-10-15 DIAGNOSIS — E10621 Type 1 diabetes mellitus with foot ulcer: Secondary | ICD-10-CM | POA: Diagnosis not present

## 2020-10-17 NOTE — Progress Notes (Signed)
ARYAN, SPARKS ([id number]) Visit Report for 10/15/2020 Arrival Information Details Patient Name: Date of Service: [facility], [place] 01/10/9146 11:00 A M Medical Record Number: [id number] Patient Account Number: [id number] Date of Birth/Sex: Treating RN: 04-Jan-1966 (55 y.o. Erie Noe Primary Care Toivo Bordon: [name] Other Clinician: Referring Madalynne Gutmann: Treating Linday Rhodes/Extender: Elwyn Lade in Treatment: 8 Visit Information History Since Last Visit Added or deleted any medications: No Patient Arrived: Knee Scooter Any new allergies or adverse reactions: No Arrival Time: 11:18 Had a fall or experienced change in No Accompanied By: self activities of daily living that may affect Transfer Assistance: None risk of falls: Patient Identification Verified: Yes Signs or symptoms of abuse/neglect since last visito No Secondary Verification Process Completed: Yes Hospitalized since last visit: No Patient Requires Transmission-Based Precautions: No Implantable device outside of the clinic excluding No Patient Has Alerts: No cellular tissue based products placed in the center since last visit: Has Dressing in Place as Prescribed: Yes Has Footwear/Offloading in Place as Prescribed: Yes Left: T Contact Cast otal Pain Present Now: No Electronic Signature(s) Signed: 10/15/2020 6:08:57 PM By: Rhae Hammock RN Entered By: Rhae Hammock on 10/15/2020 11:19:07 -------------------------------------------------------------------------------- Encounter Discharge Information Details Patient Name: Date of Service: [facility], [place] 8/65/7846 11:00 A M Medical Record Number: [id number] Patient Account Number: [id number] Date of Birth/Sex: Treating RN: 08-08-1965 (55 y.o. Marcheta Grammes Primary Care Nyheim Seufert: [name] Other Clinician: Referring Thomos Domine: Treating Kyal Arts/Extender: Elwyn Lade in  Treatment: 8 Encounter Discharge Information Items Post Procedure Vitals Discharge Condition: Stable Temperature (F): 98 Ambulatory Status: Walker Pulse (bpm): 74 Discharge Destination: Home Respiratory Rate (breaths/min): 17 Transportation: Private Auto Blood Pressure (mmHg): 145/87 Schedule Follow-up Appointment: Yes Clinical Summary of Care: Provided on 10/15/2020 Form Type Recipient Paper Patient Patient Notes Uses knee scooter Electronic Signature(s) Signed: 10/15/2020 3:04:59 PM By: Lorrin Jackson Entered By: Lorrin Jackson on 10/15/2020 15:04:58 -------------------------------------------------------------------------------- Lower Extremity Assessment Details Patient Name: Date of Service: [facility], [place] 08/23/4008 11:00 A M Medical Record Number: [id number] Patient Account Number: [id number] Date of Birth/Sex: Treating RN: 1965/09/18 (55 y.o. Burnadette Pop, Lauren Primary Care Tarl Cephas: [name] Other Clinician: Referring Deborah Lazcano: Treating Gwenette Wellons/Extender: [name], [name] in Treatment: 8 Edema Assessment Assessed: Shirlyn Goltz: Yes] Patrice Paradise: No] Edema: [Left: N] [Right: o] Calf Left: Right: Point of Measurement: 33 cm From Medial Instep 36 cm Ankle Left: Right: Point of Measurement: 8 cm From Medial Instep 22 cm Vascular Assessment Pulses: Dorsalis Pedis Palpable: [Left:Yes] Posterior Tibial Palpable: [Left:Yes] Electronic Signature(s) Signed: 10/15/2020 6:08:57 PM By: Rhae Hammock RN Entered By: Rhae Hammock on 10/15/2020 11:29:53 -------------------------------------------------------------------------------- Multi Wound Chart Details Patient Name: Date of Service: [facility], [place] 0/34/7425 11:00 A M Medical Record Number: [id number] Patient Account Number: [id number] Date of Birth/Sex: Treating RN: 1965-06-14 (55 y.o. [name] Primary Care Riya Huxford: [name] Other Clinician: Referring  Barnie Sopko: Treating Miracle Mongillo/Extender: [name], [name] in Treatment: 8 Vital Signs Height(in): 76 Capillary Blood Glucose(mg/dl): 122 Weight(lbs): 260 Pulse(bpm): 75 Body Mass Index(BMI): 32 Blood Pressure(mmHg): 145/87 Temperature(F): 98 Respiratory Rate(breaths/min): 17 Photos: [6:No Photos Left Metatarsal head fourth] [N/A:N/A N/A] Wound Location: [6:Gradually Appeared] [N/A:N/A] Wounding Event: [6:Diabetic Wound/Ulcer of the Lower] [N/A:N/A] Primary Etiology: [6:Extremity Cataracts, Hypertension, Type II] [N/A:N/A] Comorbid History: [6:Diabetes, Osteomyelitis, Neuropathy 07/03/2020] [N/A:N/A] Date Acquired: [6:8] [N/A:N/A] Weeks of Treatment: [6:Open] [N/A:N/A] Wound Status: [6:0.5x0.4x0.3] [N/A:N/A] Measurements L x W x D (cm) [6:0.157] [N/A:N/A] A (cm) : rea [  6:0.047] [N/A:N/A] Volume (cm) : [6:88.90%] [N/A:N/A] % Reduction in A [6:rea: 91.70%] [N/A:N/A] % Reduction in Volume: [6:12] Starting Position 1 (o'clock): [6:12] Ending Position 1 (o'clock): [6:0.2] Maximum Distance 1 (cm): [6:Yes] [N/A:N/A] Undermining: [6:Grade 2] [N/A:N/A] Classification: [6:Medium] [N/A:N/A] Exudate A mount: [6:Serosanguineous] [N/A:N/A] Exudate Type: [6:red, brown] [N/A:N/A] Exudate Color: [6:Well defined, not attached] [N/A:N/A] Wound Margin: [6:Large (67-100%)] [N/A:N/A] Granulation A mount: [6:Pink, Pale] [N/A:N/A] Granulation Quality: [6:None Present (0%)] [N/A:N/A] Necrotic A mount: [6:Fat Layer (Subcutaneous Tissue): Yes N/A] Exposed Structures: [6:Fascia: No Tendon: No Muscle: No Joint: No Bone: No Large (67-100%)] [N/A:N/A] Epithelialization: [6:Debridement - Excisional] [N/A:N/A] Debridement: Pre-procedure Verification/Time Out 11:45 [N/A:N/A] Taken: [6:Subcutaneous] [N/A:N/A] Tissue Debrided: [6:Skin/Subcutaneous Tissue] [N/A:N/A] Level: [6:0.2] [N/A:N/A] Debridement A (sq cm): [6:rea Curette] [N/A:N/A] Instrument: [6:Moderate] [N/A:N/A] Bleeding:  [6:Silver Nitrate] [N/A:N/A] Hemostasis A chieved: [6:0] [N/A:N/A] Procedural Pain: [6:0] [N/A:N/A] Post Procedural Pain: [6:Procedure was tolerated well] [N/A:N/A] Debridement Treatment Response: [6:0.5x0.4x0.3] [N/A:N/A] Post Debridement Measurements L x W x D (cm) [6:0.047] [N/A:N/A] Post Debridement Volume: (cm) [6:Debridement] [N/A:N/A] Procedures Performed: [6:T Contact Cast otal] Treatment Notes Electronic Signature(s) Signed: 10/16/2020 4:16:43 PM By: [name] MD Signed: 10/17/2020 6:30:22 PM By: [name] RN, BSN Entered By: [name] on 10/15/2020 12:07:02 -------------------------------------------------------------------------------- Multi-Disciplinary Care Plan Details Patient Name: Date of Service: [facility], [place] 2/83/1517 11:00 A M Medical Record Number: [id number] Patient Account Number: [id number] Date of Birth/Sex: Treating RN: 03-23-1966 (55 y.o. [name] Primary Care Aften Lipsey: [name] Other Clinician: Referring Vayla Wilhelmi: Treating Fayrene Towner/Extender: Elwyn Lade in Treatment: 8 Multidisciplinary Care Plan reviewed with physician Active Inactive Nutrition Nursing Diagnoses: Impaired glucose control: actual or potential Potential for alteratiion in Nutrition/Potential for imbalanced nutrition Goals: Patient/caregiver agrees to and verbalizes understanding of need to use nutritional supplements and/or vitamins as prescribed Date Initiated: 08/20/2020 Date Inactivated: 09/17/2020 Target Resolution Date: 09/21/2020 Goal Status: Met Patient/caregiver will maintain therapeutic glucose control Date Initiated: 08/20/2020 Target Resolution Date: 11/16/2020 Goal Status: Active Interventions: Assess HgA1c results as ordered upon admission and as needed Assess patient nutrition upon admission and as needed per policy Provide education on elevated blood sugars and impact on wound healing Provide education  on nutrition Treatment Activities: Education provided on Nutrition : 08/27/2020 Notes: Wound/Skin Impairment Nursing Diagnoses: Impaired tissue integrity Knowledge deficit related to ulceration/compromised skin integrity Goals: Patient/caregiver will verbalize understanding of skin care regimen Date Initiated: 08/20/2020 Target Resolution Date: 11/16/2020 Goal Status: Active Ulcer/skin breakdown will have a volume reduction of 30% by week 4 Date Initiated: 08/20/2020 Date Inactivated: 09/17/2020 Target Resolution Date: 09/21/2020 Goal Status: Met Interventions: Assess patient/caregiver ability to obtain necessary supplies Assess patient/caregiver ability to perform ulcer/skin care regimen upon admission and as needed Assess ulceration(s) every visit Provide education on ulcer and skin care Notes: Electronic Signature(s) Signed: 10/17/2020 6:30:22 PM By: [name] RN, BSN Entered By: [name] on 10/15/2020 16:26:31 -------------------------------------------------------------------------------- Pain Assessment Details Patient Name: Date of Service: [facility], [place]. 11/26/9483 11:00 A M Medical Record Number: [id number] Patient Account Number: [id number] Date of Birth/Sex: Treating RN: [birth year] (55 y.o. Erie Noe Primary Care Jermaine Neuharth: [name] Other Clinician: Referring Moneisha Vosler: Treating Rayshell Goecke/Extender: [name], [name] in Treatment: 8 Active Problems Location of Pain Severity and Description of Pain Patient Has Paino No Site Locations Rate the pain. Rate the pain. Current Pain Level: 0 Pain Management and Medication Current Pain Management: Electronic Signature(s) Signed: 10/15/2020 6:08:57 PM By: Rhae Hammock RN Entered By: Rhae Hammock on 10/15/2020 11:24:54 --------------------------------------------------------------------------------  Patient/Caregiver Education Details Patient Name: Date of  Service: [facility], [place] 4/25/[id number]:51 A M Medical Record Number: [id number] Patient Account Number: [id number] Date of Birth/Gender: Treating RN: 10/19/1965 (55 y.o. [name] Primary Care Physician: [name] Other Clinician: Referring Physician: Treating Physician/Extender: Elwyn Lade in Treatment: 8 Education Assessment Education Provided To: Patient Education Topics Provided Wound/Skin Impairment: Methods: Explain/Verbal Responses: State content correctly Electronic Signature(s) Signed: 10/17/2020 6:30:22 PM By: [name] RN, BSN Entered By: [name] on 10/15/2020 17:13:51 -------------------------------------------------------------------------------- Wound Assessment Details Patient Name: Date of Service: [facility], [place] 0/16/0109 11:00 A M Medical Record Number: [id number] Patient Account Number: [id number] Date of Birth/Sex: Treating RN: Jul 19, 1965 (55 y.o. Burnadette Pop, Lauren Primary Care Cleta Heatley: [name] Other Clinician: Referring Helix Lafontaine: Treating Estel Tonelli/Extender: [name], [name] in Treatment: 8 Wound Status Wound Number: 6 Primary Diabetic Wound/Ulcer of the Lower Extremity Etiology: Wound Location: Left Metatarsal head fourth Wound Status: Open Wounding Event: Gradually Appeared Comorbid Cataracts, Hypertension, Type II Diabetes, Osteomyelitis, Date Acquired: 07/03/2020 History: Neuropathy Weeks Of Treatment: 8 Clustered Wound: No Photos Wound Measurements Length: (cm) 0.5 Width: (cm) 0.4 Depth: (cm) 0.3 Area: (cm) 0.157 Volume: (cm) 0.047 % Reduction in Area: 88.9% % Reduction in Volume: 91.7% Epithelialization: Large (67-100%) Tunneling: No Undermining: Yes Starting Position (o'clock): 12 Ending Position (o'clock): 12 Maximum Distance: (cm) 0.2 Wound Description Classification: Grade 2 Wound Margin: Well defined, not attached Exudate  Amount: Medium Exudate Type: Serosanguineous Exudate Color: red, brown Foul Odor After Cleansing: No Slough/Fibrino No Wound Bed Granulation Amount: Large (67-100%) Exposed Structure Granulation Quality: Pink, Pale Fascia Exposed: No Necrotic Amount: None Present (0%) Fat Layer (Subcutaneous Tissue) Exposed: Yes Tendon Exposed: No Muscle Exposed: No Joint Exposed: No Bone Exposed: No Treatment Notes Wound #6 (Metatarsal head fourth) Wound Laterality: Left Cleanser Wound Cleanser Discharge Instruction: Cleanse the wound with wound cleanser prior to applying a clean dressing using gauze sponges, not tissue or cotton balls. Peri-Wound Care Topical Primary Dressing Maxorb Extra Calcium Alginate 2x2 in Discharge Instruction: Apply calcium alginate to wound bed as instructed Secondary Dressing Woven Gauze Sponges 2x2 in Discharge Instruction: Apply over primary dressing as directed. Optifoam Non-Adhesive Dressing, 4x4 in Discharge Instruction: Apply over primary dressing cut to make foam donut Secured With [facility] T 4 x 2 (in/yd) ape Discharge Instruction: Secure dressing with tape as directed. Compression Wrap Compression Stockings Add-Ons Electronic Signature(s) Signed: 10/16/2020 5:09:03 PM By: Rhae Hammock RN Signed: 10/17/2020 11:07:31 AM By: Sandre Kitty Previous Signature: 10/15/2020 6:08:57 PM Version By: Rhae Hammock RN Entered By: Sandre Kitty on 10/16/2020 15:59:57 -------------------------------------------------------------------------------- Vitals Details Patient Name: Date of Service: [facility], [place] 0/25/4270 11:00 A M Medical Record Number: [id number] Patient Account Number: [id number] Date of Birth/Sex: Treating RN: July 17, 1965 (55 y.o. Burnadette Pop, Lauren Primary Care Iyanna Drummer: [name] Other Clinician: Referring Hanan Moen: Treating Elyjah Hazan/Extender: [name], [name] in  Treatment: 8 Vital Signs Time Taken: 11:19 Temperature (F): 98 Height (in): 76 Pulse (bpm): 74 Weight (lbs): 260 Respiratory Rate (breaths/min): 17 Body Mass Index (BMI): 31.6 Blood Pressure (mmHg): 145/87 Capillary Blood Glucose (mg/dl): 122 Reference Range: 80 - 120 mg / dl Electronic Signature(s) Signed: 10/15/2020 6:08:57 PM By: Rhae Hammock RN Entered By: Rhae Hammock on 10/15/2020 11:24:48

## 2020-10-17 NOTE — Progress Notes (Signed)
BLEASE, CAPALDI ([id number]) Visit Report for 10/15/2020 Debridement Details Patient Name: Date of Service: [facility], [place] 0/86/5784 11:00 A M Medical Record Number: [id number] Patient Account Number: [id number] Date of Birth/Sex: Treating RN: 1966/04/21 (55 y.o. [name] Primary Care Provider: [name] Other Clinician: Referring Provider: Treating Provider/Extender: [name], [name] in Treatment: 8 Debridement Performed for Assessment: Wound #6 Left Metatarsal head fourth Performed By: Physician [name]., MD Debridement Type: Debridement Severity of Tissue Pre Debridement: Fat layer exposed Level of Consciousness (Pre-procedure): Awake and Alert Pre-procedure Verification/Time Out Yes - 11:45 Taken: Start Time: 11:45 T Area Debrided (L x W): otal 0.5 (cm) x 0.4 (cm) = 0.2 (cm) Tissue and other material debrided: Non-Viable, Subcutaneous Level: Skin/Subcutaneous Tissue Debridement Description: Excisional Instrument: Curette Bleeding: Moderate Hemostasis Achieved: Silver Nitrate End Time: 11:46 Procedural Pain: 0 Post Procedural Pain: 0 Response to Treatment: Procedure was tolerated well Level of Consciousness (Post- Awake and Alert procedure): Post Debridement Measurements of Total Wound Length: (cm) 0.5 Width: (cm) 0.4 Depth: (cm) 0.3 Volume: (cm) 0.047 Character of Wound/Ulcer Post Debridement: Improved Severity of Tissue Post Debridement: Fat layer exposed Post Procedure Diagnosis Same as Pre-procedure Electronic Signature(s) Signed: 10/16/2020 4:16:43 PM By: [name] MD Signed: 10/17/2020 6:30:22 PM By: [name] RN, BSN Entered By: [name] on 10/15/2020 12:07:23 -------------------------------------------------------------------------------- HPI Details Patient Name: Date of Service: [facility], [place] 1/32/4401 11:00 A M Medical Record Number: [id number] Patient Account Number:  [id number] Date of Birth/Sex: Treating RN: 10-Dec-1965 (55 y.o. [name] Primary Care Provider: [name] Other Clinician: Referring Provider: Treating Provider/Extender: [name], [name] in Treatment: 8 History of Present Illness HPI Description: ADMISSION 05/04/2018 This is a 55 year old man who apparently is a type I diabetic confirmed by appropriate serology testing 6 years ago. He is therefore on insulin. His most recent hemoglobin A1c was 10.7 on 03/27/2018. 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 As a result of a combination of this he is insensate and even has reduced sensation in his upper extremities. His current problem started in early July he noted a blister over the left fourth plantar metatarsal head. He saw [name] his podiatrist on 01/07/2018. At that time he had a wound that measured 2 x 1 x 0.4 cm. An x-ray did not show osteomyelitis. It did not appear that he actually followed up. He was admitted to hospital on 03/25/2018 through 03/30/2018 with his left foot painful and swollen. He underwent a surgical IandD by [name]. I believe his culture at that time showed Enterococcus faecalis and a bone culture showed enterococcus avium. He is been reviewed by [name] of infectious disease although I have not reviewed his note and he has been on 6 weeks worth of ertapenem which should finish sometime apparently on December 9.. MRI suggested possibility of septic arthritis but did not comment specifically on osteomyelitis. A more recent wound culture was negative. The patient has been referred here for consideration among other things of hyperbaric oxygen versus proceeding with a partial ray amputation. This would involve excision of the fourth metatarsal head. The patient is offloading  this and a Darco forefoot off loader. I think he is using iodoform packing. 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 Certainly would be a possibility of a total contact cast.  I did not see any arterial studies on him however his peripheral pulses are palpable 05/11/2018; patient started his hyperbarics on Friday but did not dive yesterday for personal issues. He saw [name] on 12//19 he notes that during the hospitalization in October 2019 he had an abscess and underwent a bone biopsy with culture that showed group B strep, enterococcus avium and Enterococcus faecalis. There was Prevotella from the abscess. He was put on ertapenem for 6 weeks which he completed. Per [name] notable for the fact that he is not been treated with ampicillin or amoxicillin. His hemoglobin A1c was noted to be high at 10.7. He is started on ampicillin sulbactam every 6 again I think this is for better coverage for the enterococcus. We started him on silver alginate as there is still purulent looking drainage coming out of the wound and there is indeed exposed bone. The patient has not been unwell 05/18/2018; patient continues with hyperbarics he is tolerating this well and has no complaints. He is on Unasyn every 6. We have been using silver alginate to the area over the fourth plantar metatarsal head. He is a poorly controlled diabetic. There is certainly less purulent drainage than when I first saw this man at the beginning of the month. He only sedimentation rate I see on him was 40 on 03/26/2018. States his blood sugars are under better control in the 120s to 130 range. Follows with his endocrinologist in [facility] I change his primary dressing to Prisma today attempting to increase the granulation. After I left the room he called [place] back into look at a area that he noticed on the right foot 2 days ago. Very superficial and in the medial aspect of his high plantar arch [secondary to his  neuropathy] he has a custom insert in the shoe on the right foot. 05/24/2018; continues with HBO doing well. He saw infectious disease today and is still on Unasyn every 6 hours. Not making a lot of progress on the left foot with Prisma I changed him to silver alginate strips. The new area on the right midfoot he is putting silver alginate on this as well. 06/01/2018; patient remains on Unasyn. Not making a lot of progress with granulation. I am going to put him in a total contact cast use Prisma for now. He has a high co-pay for an advanced treatment option but I suspect feeling in this wound may come to that 06/04/2018; patient back for the obligatory first total contact cast change. We continue to use Prisma 06/08/2018; the patient is tolerating the cast well although he had some loosening over the tibia which rubbed when he walked. There is no lesion here. We have been using Prisma under the total contact cast. He is in hyperbarics which she is tolerating well 06/15/2018; tolerating total contact cast and hyperbarics well. He continues IV antibiotics and for the underlying osteomyelitis as directed by infectious disease. His wound is making really nice progress 1/21; Patient's wound is down in depth. Using silver collagen. Ab's end this week and Pic line is to be d/ced 1/28; 0.8 cm in depth which is more than last time. We have been using silver collagen under total contact cast. He continues in hyperbarics. His IV antibiotics are finished PICC line has been removed 2/4; surprisingly today the area is actually epithelialized. There is no open area. There is however a divot where the epithelium is gone down to close the wound surface. I am not completely  convinced that this is going to remain viable over time but I think time is the best thing to determine this. There is no evidence of surrounding infection. As of today I do not think a repeat MRI or any other procedure is going to be indicative of  whether this is going to maintain a closed state. I am going to continue him in a total contact cast. He will complete HBO. Next week I will allow him perhaps to graduate into a Darco forefoot off loader, then a healing sandal. I will then send him back to podiatry to see if they can fashion an offloading shoe if this area remains closed 2/10; the patient was taken out of the cast today the area is healed. He has a divot however this is fully epithelialized and the epithelialized tissue looks healthy. I have put him into a Darco forefoot off loader. He will complete his last 2 hyperbaric treatments. I will see him next week and I may transition him into a surgical shoe versus his own shoe. He is following up with [name] of podiatry and I think a diabetic shoe with custom inserts could be provided with the signature provided by his endocrinologist ADMISSION 6/22 CLINICAL DATA: Foot ulcer, question of osteomyelitis EXAM: MRI OF THE LEFT FOOT WITHOUT CONTRAST TECHNIQUE: Multiplanar, multisequence MR imaging of the left was performed. No intravenous contrast was administered. COMPARISON: Radiograph same day FINDINGS: Bones/Joint/Cartilage Small cystic changes are seen at the fifth metatarsal head, likely degenerative changes. No marrow edema, osseous fracture, or periosteal reaction are seen throughout the osseous structures. Small joint effusion seen at the fifth MTP joint. There appears to be chronic cortical irregularity with erosive type change seen at the fourth metatarsal head which is unchanged since June 28, 2019, however new since 2019. Ligaments The Lisfranc ligaments and collateral ligaments appear to be intact. Muscles and Tendons Diffusely increased signal seen throughout the muscles surrounding the forefoot with fatty atrophy. The flexor and extensor tendons appear to be intact. The plantar fascia is intact. Soft tissues Along the dorsal lateral aspect of the fifth  metatarsal shaft there is a focal area superficial ulceration measuring approximately 8 mm in transverse dimension. There is overlying skin thickening and subcutaneous edema seen. No sinus tract or loculated fluid collection is noted. IMPRESSION: 1. Small focal area of ulceration along the dorsal lateral aspect of the fifth metatarsal shaft. No definite evidence of acute osteomyelitis, sinus tract, or abscess. 2. Progressive chronic erosive cortical destruction seen at the fourth metatarsal head since 2019, however unchanged since June 28, 2019, which could be from chronic osteomyelitis or prior injury. 3. Small joint effusion at the fifth MTP joint. Electronically Signed By: Prudencio Pair M.D. On: 08/02/2019 79:61 54 year old type I diabetic who we had in clinic in late 2019 to February 2020 with osteomyelitis of his left fourth metatarsal head and a wound in this area. This eventually healed with IV antibiotics and hyperbaric oxygen. He was relatively well until February of this year when he developed I believe 2 wounds on the left fifth met head 1 plantar and 1 more laterally. The one more laterally has since closed over but he has been left with a fairly persistent wound in the plantar left fifth met head. Has been following with [name] of podiatry. For an extended period of time he used Medihoney. He received doxycycline and April at which time a culture showed E. coli and MSSA. An MRI done on August 02, 2019 [  shown above] did not show osteomyelitis but did suggest some ongoing destruction at the fourth metatarsal head. He was switched to silver alginate as the primary dressing in May. Earlier this month he received a 2-week course of Bactrim due to erythema around the wound. He was referred to Dr. Alfred Levins of infectious disease who did not think anything was infected and recommended stopping antibiotics. The patient said he felt really well has you finished up the Bactrim but then  yesterday morning he became febrile again and noted more erythema around the wound. He was referred here for our review of this. His temperature today is 99.3. He denies any other infectious symptoms no cough no upper respiratory, no dysuria etc. He has been using silver alginate and offloading this area with a surgical shoe. 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 He apparently had shoulder surgery in the fall 2020 I did not look this up he apparently has hardware in this area. Recent x-ray done in Dr. Pasty Arch office on 6/7 was negative for osteo- ABIs have always been noncompressible. Is not previously been thought that arterial insufficiency is contributing to this. 6/29; patient returns the clinic. I thought he might be going to see Dr. Earleen Newport ongoing for this wound but he is back today. He did see Dr. Earleen Newport on 11/22/2019 I believe he agreed with the MRI and apparently was making some arrangements to have this done in [facility] which I am fine with if they can compare with the study of March 2. In any case he returns with a clinic of the wound is not as good. He has been using silver alginate. He is still on another antibiotic that he says Dr. Earleen Newport prescribed 7/8; the patient had his MRI which showed a progressive soft tissue ulceration plantar to the fifth MTP joint with increased soft tissue enhancement throughout the small toe no abscess was noted. There was progressive signal changes in the fifth metatarsal head and fifth proximal phalanx without cortical destruction or definite abnormal T1 signal. These findings are nonspecific could be secondary to hyperemia or early osteomyelitis. He is on amoxicillin as directed by Dr. Earleen Newport. There were plans to do a bone biopsy and culture done in the OR on Friday however the patient canceled this because  of insurance issues [surgical center not in [place]. In any case I have told him that this procedure in my opinion needs to be done. He is using Silvadene cream READMISSION 02/07/2020 This is a patient who had a progressive ulceration over the fifth MTP. We had him here in June and into the early July. He was also followed by [name] of podiatry. The day after we last saw him on 7/9 he went underwent a excision of the fifth MTP to aid in wound healing over this area. Pathology did not show osteomyelitis. Since then he has been following with Dr. Earleen Newport. Notable that earlier this month he presented with worse wound with purulence. CandS showed MSSA and he was treated with doxycycline and Silvadene cream. More recently he has been changed to Augmentin. He is using Betadine to the wound. He has a forefoot offloading shoe at some point in the postoperative. This wound opened in the surgical line. He is here for our review of this. With regards to his diabetes I spent some time on this today. This was diagnosed in his  mid 41s he was on pills for a while and then on insulin. When he was here last time he told me that he thought he had had antipancreatic antibodies that showed he was type I if so I do not see this. He followed for a period of time with Dr. Meredith Pel endocrinology at [facility] who I gather is currently independent practice in [facility]. I have looked through her notes as it applies to what is available in care everywhere and she consistently calls him a type II diabetic. If he had other studies of his diabetes I do not see this. I have therefore changed my label of him to type II ABI in our clinic was 1.41 on the left 9/13; arrives in clinic today with things not looking as good. He has a small linear wound in the incision line but nonviable rolled edges and debris around the circumference. As well it looks as though he had a deep tissue injury over the base of the fifth metatarsal.  Raised may be blistered but no open area here. Also concerning erythema around the wound both distally and proximally 9/20; PCR culture I did last time showed high Enterococcus faecalis, medium MRSA and 3 different anaerobic's including medium Peptostreptococcus magnus. I gave him a combination of Augmentin and trimethoprim sulfamethoxazole starting on 9/16 for 10 days. He is tolerating these well He comes in today. His original wound area at the surgical site only has a small crevice we are using endoform here. Not surprisingly the area over the base of the fifth metatarsal has open with a superficial wound. He has a wrap/surgical shoe injury dorsally on the left foot he is asking about a total contact cast and I think what I know about this area suggests that the lateral part of his foot is a weightbearing surface and I think we could certainly justify this. 9/27; patient is finishing his antibiotics. His surgical wound is actually closed the area over the base of the left fifth metatarsal of is also closed but still with some eschar. 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 As such he actually walks on the outside of his foot. He has been in touch with [facility] Liliane Channel is their orthotic specialist READMISSION 08/20/2020 This is a 55 year old man who is a type II diabetic. 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 He has been in this clinic before with wounds over his left fifth and left fourth met head. He has a history of osteomyelitis in the left fifth met head requiring an excision of the fifth MTP.. The patient tells [place] that he has an AFO brace now to correct the ankle deformity. Sometime in February he developed a callus over the fourth metatarsal head ultimately Dr. Earleen Newport open this area to expose the wound. He used Silvadene cream for a while most recently  Promogran. He has had antibiotics as well including doxycycline and apparently currently Augmentin although I am not certain there is been any cultures. He states the area has not been x-rayed. 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 The met head here is subluxed. His x-ray that we ordered last week was negative for  osteomyelitis. Clinically there does not appear to be infection here. No erythema no drainage the wound does not probe to bone 4/4; put a total contact cast on him last week primary dressing of silver collagen he arrives in clinic today with a wound measuring larger and as opposed to last week a direct probe to bone. He is definitely going to need an MRI 4/11; miraculously the divot the probe to bone his filled in this week. Unfortunately his MRI is not till next Monday. We have been using silver collagen under a total contact cast. He has completed his antibiotics 4/18; no palpable bone. The distal half of this wound is has depth but there is no exposed bone. More proximal is at the level of the surrounding skin. His MRI is being delayed because of insurance issues was supposed to be today then Wednesday and now we are not exactly sure what the date is or what the issue is. In general things look somewhat better. We have been using silver collagen under the total contact cast 4/27; patient presents for removal and placement of TCC. He has tolerated the cast well in the past week. He has no complaints today. This MRI is still pending scheduling. We will not use a silver dressing so in case it does get scheduled he can get this done without issue. 5/2; patient's wound is smaller but with undermining. We finally have approval for his MRI which has been going on for a month or more now. 5/9 the wound  is smaller but with above 0.4 cm of depth we still do not have an MRI. No evidence of continued infection. He is tolerating the total contact cast well. 5/16; I think his wound is gradually improving. Medial part of it is deeper than the rest however this does not go to bone. No evidence of surrounding infection. He is tolerating the total contact cast well. Electronic Signature(s) Signed: 10/16/2020 4:16:43 PM By: [name] MD Entered By: [name] on 10/15/2020 12:09:10 -------------------------------------------------------------------------------- Physical Exam Details Patient Name: Date of Service: [facility], [place] 02/03/91 11:00 A M Medical Record Number: [id number] Patient Account Number: [id number] Date of Birth/Sex: Treating RN: 01/22/1966 (55 y.o. [name] Primary Care Provider: [name] Other Clinician: Referring Provider: Treating Provider/Extender: [name], [name] in Treatment: 8 Constitutional Patient is hypertensive.. Pulse regular and within target range for patient.Marland Kitchen Respirations regular, non-labored and within target range.. Temperature is normal and within the target range for the patient.Marland Kitchen Appears in no distress. Notes Wound exam; left foot left fourth metatarsal head. Medial part of this wound is deeper with debris on the surface I used a #3 curette to remove this as well as some dry flaking skin and callus around the wound margins. Hemostasis with silver nitrate. I was unable to identify any undermining Electronic Signature(s) Signed: 10/16/2020 4:16:43 PM By: [name] MD Entered By: [name] on 10/15/2020 12:10:02 -------------------------------------------------------------------------------- Physician Orders Details Patient Name: Date of Service: [facility], [place] 3/33/5456 11:00 A M Medical Record Number: [id number] Patient Account Number: [id number] Date of Birth/Sex: Treating  RN: [birth year] (55 y.o. [name] Primary Care Provider: [name] Other Clinician: Referring Provider: Treating Provider/Extender: Elwyn Lade in Treatment: 8 Verbal / Phone Orders: No Diagnosis Coding ICD-10 Coding Code Description E11.621 Type 2 diabetes mellitus with foot ulcer L97.528 Non-pressure chronic ulcer of other part of left foot with other specified severity Follow-up Appointments Return Appointment in 1  week. Bathing/ Shower/ Hygiene May shower with protection but do not get wound dressing(s) wet. Off-Loading Total Contact Cast to Left Lower Extremity Additional Orders / Instructions Other: - Call to schedule appt for MRI Wound Treatment Wound #6 - Metatarsal head fourth Wound Laterality: Left Cleanser: Wound Cleanser 1 x Per Week/7 Days Discharge Instructions: Cleanse the wound with wound cleanser prior to applying a clean dressing using gauze sponges, not tissue or cotton balls. Prim Dressing: Maxorb Extra Calcium Alginate 2x2 in 1 x Per Week/7 Days ary Discharge Instructions: Apply calcium alginate to wound bed as instructed Secondary Dressing: Woven Gauze Sponges 2x2 in (Generic) 1 x Per Week/7 Days Discharge Instructions: Apply over primary dressing as directed. Secondary Dressing: Optifoam Non-Adhesive Dressing, 4x4 in 1 x Per Week/7 Days Discharge Instructions: Apply over primary dressing cut to make foam donut Secured With: 43M Medipore H Soft Cloth Surgical T 4 x 2 (in/yd) 1 x Per Week/7 Days ape Discharge Instructions: Secure dressing with tape as directed. Electronic Signature(s) Signed: 10/16/2020 4:16:43 PM By: [name] MD Signed: 10/17/2020 6:30:22 PM By: [name] RN, BSN Entered By: [name] on 10/15/2020 11:45:56 -------------------------------------------------------------------------------- Problem List Details Patient Name: Date of Service: [facility], [place] 4/74/2595 11:00 A  M Medical Record Number: [id number] Patient Account Number: [id number] Date of Birth/Sex: Treating RN: 01-May-1966 (55 y.o. [name] Primary Care Provider: [name] Other Clinician: Referring Provider: Treating Provider/Extender: [name], [name] in Treatment: 8 Active Problems ICD-10 Encounter Code Description Active Date MDM Diagnosis E11.621 Type 2 diabetes mellitus with foot ulcer 08/20/2020 No Yes L97.528 Non-pressure chronic ulcer of other part of left foot with other specified 08/20/2020 No Yes severity Inactive Problems Resolved Problems Electronic Signature(s) Signed: 10/16/2020 4:16:43 PM By: [name] MD Entered By: [name] on 10/15/2020 12:06:53 -------------------------------------------------------------------------------- Progress Note Details Patient Name: Date of Service: [facility], [place] 2/95/1884 11:00 A M Medical Record Number: [id number] Patient Account Number: [id number] Date of Birth/Sex: Treating RN: 03/31/1966 (55 y.o. [name] Primary Care Provider: [name] Other Clinician: Referring Provider: Treating Provider/Extender: [name], [name] in Treatment: 8 Subjective History of Present Illness (HPI) ADMISSION 05/04/2018 This is a 55 year old man who apparently is a type I diabetic confirmed by appropriate serology testing 6 years ago. He is therefore on insulin. His most recent hemoglobin A1c was 10.7 on 03/27/2018. 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 As a result of a combination of this he is insensate and even has reduced sensation in his upper extremities. His current problem started in early July he noted a blister over the left fourth plantar metatarsal head. He saw [name] his podiatrist on  01/07/2018. At that time he had a wound that measured 2 x 1 x 0.4 cm. An x-ray did not show osteomyelitis. It did not appear that he actually followed up. He was admitted to hospital on 03/25/2018 through 03/30/2018 with his left foot painful and swollen. He underwent a surgical IandD by [name]. I believe his culture at that time showed Enterococcus faecalis and a bone culture showed enterococcus avium. He is been reviewed by [name] of infectious disease although I have not reviewed his note and he has been on 6 weeks worth of ertapenem which should finish sometime apparently on December 9.. MRI suggested possibility of septic arthritis but did  not comment specifically on osteomyelitis. A more recent wound culture was negative. The patient has been referred here for consideration among other things of hyperbaric oxygen versus proceeding with a partial ray amputation. This would involve excision of the fourth metatarsal head. The patient is offloading this and a Darco forefoot off loader. I think he is using iodoform packing. 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 Certainly would be a possibility of a total contact cast. I did not see any arterial studies on him however his peripheral pulses are palpable 05/11/2018; patient started his hyperbarics on Friday but did not dive yesterday for personal issues. He saw [name] on 12//19 he notes that during the hospitalization in October 2019 he had an abscess and underwent a bone biopsy with culture that showed group B strep, enterococcus avium and Enterococcus faecalis. There was Prevotella from the abscess. He was put on ertapenem for 6 weeks which he completed. Per [name] notable for the fact that he is not been treated with ampicillin or amoxicillin. His hemoglobin A1c was noted to be high at 10.7. He is started on ampicillin sulbactam every 6 again I think this is for better coverage for the enterococcus. We  started him on silver alginate as there is still purulent looking drainage coming out of the wound and there is indeed exposed bone. The patient has not been unwell 05/18/2018; patient continues with hyperbarics he is tolerating this well and has no complaints. He is on Unasyn every 6. We have been using silver alginate to the area over the fourth plantar metatarsal head. He is a poorly controlled diabetic. There is certainly less purulent drainage than when I first saw this man at the beginning of the month. He only sedimentation rate I see on him was 40 on 03/26/2018. States his blood sugars are under better control in the 120s to 130 range. Follows with his endocrinologist in [facility] I change his primary dressing to Prisma today attempting to increase the granulation. After I left the room he called [place] back into look at a area that he noticed on the right foot 2 days ago. Very superficial and in the medial aspect of his high plantar arch [secondary to his neuropathy] he has a custom insert in the shoe on the right foot. 05/24/2018; continues with HBO doing well. He saw infectious disease today and is still on Unasyn every 6 hours. Not making a lot of progress on the left foot with Prisma I changed him to silver alginate strips. The new area on the right midfoot he is putting silver alginate on this as well. 06/01/2018; patient remains on Unasyn. Not making a lot of progress with granulation. I am going to put him in a total contact cast use Prisma for now. He has a high co-pay for an advanced treatment option but I suspect feeling in this wound may come to that 06/04/2018; patient back for the obligatory first total contact cast change. We continue to use Prisma 06/08/2018; the patient is tolerating the cast well although he had some loosening over the tibia which rubbed when he walked. There is no lesion here. We have been using Prisma under the total contact cast. He is in hyperbarics which she is  tolerating well 06/15/2018; tolerating total contact cast and hyperbarics well. He continues IV antibiotics and for the underlying osteomyelitis as directed by infectious disease. His wound is making really nice progress 1/21; Patient's wound is down in depth.  Using silver collagen. Ab's end this week and Pic line is to be d/ced 1/28; 0.8 cm in depth which is more than last time. We have been using silver collagen under total contact cast. He continues in hyperbarics. His IV antibiotics are finished PICC line has been removed 2/4; surprisingly today the area is actually epithelialized. There is no open area. There is however a divot where the epithelium is gone down to close the wound surface. I am not completely convinced that this is going to remain viable over time but I think time is the best thing to determine this. There is no evidence of surrounding infection. As of today I do not think a repeat MRI or any other procedure is going to be indicative of whether this is going to maintain a closed state. I am going to continue him in a total contact cast. He will complete HBO. Next week I will allow him perhaps to graduate into a Darco forefoot off loader, then a healing sandal. I will then send him back to podiatry to see if they can fashion an offloading shoe if this area remains closed 2/10; the patient was taken out of the cast today the area is healed. He has a divot however this is fully epithelialized and the epithelialized tissue looks healthy. I have put him into a Darco forefoot off loader. He will complete his last 2 hyperbaric treatments. I will see him next week and I may transition him into a surgical shoe versus his own shoe. He is following up with [name] of podiatry and I think a diabetic shoe with custom inserts could be provided with the signature provided by his endocrinologist ADMISSION 6/22 CLINICAL DATA: Foot ulcer, question of osteomyelitis EXAM: MRI OF THE LEFT FOOT  WITHOUT CONTRAST TECHNIQUE: Multiplanar, multisequence MR imaging of the left was performed. No intravenous contrast was administered. COMPARISON: Radiograph same day FINDINGS: Bones/Joint/Cartilage Small cystic changes are seen at the fifth metatarsal head, likely degenerative changes. No marrow edema, osseous fracture, or periosteal reaction are seen throughout the osseous structures. Small joint effusion seen at the fifth MTP joint. There appears to be chronic cortical irregularity with erosive type change seen at the fourth metatarsal head which is unchanged since June 28, 2019, however new since 2019. Ligaments The Lisfranc ligaments and collateral ligaments appear to be intact. Muscles and Tendons Diffusely increased signal seen throughout the muscles surrounding the forefoot with fatty atrophy. The flexor and extensor tendons appear to be intact. The plantar fascia is intact. Soft tissues Along the dorsal lateral aspect of the fifth metatarsal shaft there is a focal area superficial ulceration measuring approximately 8 mm in transverse dimension. There is overlying skin thickening and subcutaneous edema seen. No sinus tract or loculated fluid collection is noted. IMPRESSION: 1. Small focal area of ulceration along the dorsal lateral aspect of the fifth metatarsal shaft. No definite evidence of acute osteomyelitis, sinus tract, or abscess. 2. Progressive chronic erosive cortical destruction seen at the fourth metatarsal head since 2019, however unchanged since June 28, 2019, which could be from chronic osteomyelitis or prior injury. 3. Small joint effusion at the fifth MTP joint. Electronically Signed By: Prudencio Pair M.D. On: 08/02/2019 4:55 55 year old type I diabetic who we had in clinic in late 2019 to February 2020 with osteomyelitis of his left fourth metatarsal head and a wound in this area. This eventually healed with IV antibiotics and hyperbaric oxygen. He  was relatively well until February of this year when  he developed I believe 2 wounds on the left fifth met head 1 plantar and 1 more laterally. The one more laterally has since closed over but he has been left with a fairly persistent wound in the plantar left fifth met head. Has been following with [name] of podiatry. For an extended period of time he used Medihoney. He received doxycycline and April at which time a culture showed E. coli and MSSA. An MRI done on August 02, 2019 [shown above] did not show osteomyelitis but did suggest some ongoing destruction at the fourth metatarsal head. He was switched to silver alginate as the primary dressing in May. Earlier this month he received a 2-week course of Bactrim due to erythema around the wound. He was referred to Dr. Alfred Levins of infectious disease who did not think anything was infected and recommended stopping antibiotics. The patient said he felt really well has you finished up the Bactrim but then yesterday morning he became febrile again and noted more erythema around the wound. He was referred here for our review of this. His temperature today is 99.3. He denies any other infectious symptoms no cough no upper respiratory, no dysuria etc. He has been using silver alginate and offloading this area with a surgical shoe. 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 He apparently had shoulder surgery in the fall 2020 I did not look this up he apparently has hardware in this area. Recent x-ray done in Dr. Pasty Arch office on 6/7 was negative for osteo- ABIs have always been noncompressible. Is not previously been thought that arterial insufficiency is contributing to this. 6/29; patient returns the clinic. I thought he might be going to see Dr. Earleen Newport ongoing for this wound but he is back today. He did see Dr.  Earleen Newport on 11/22/2019 I believe he agreed with the MRI and apparently was making some arrangements to have this done in [facility] which I am fine with if they can compare with the study of March 2. In any case he returns with a clinic of the wound is not as good. He has been using silver alginate. He is still on another antibiotic that he says Dr. Earleen Newport prescribed 7/8; the patient had his MRI which showed a progressive soft tissue ulceration plantar to the fifth MTP joint with increased soft tissue enhancement throughout the small toe no abscess was noted. There was progressive signal changes in the fifth metatarsal head and fifth proximal phalanx without cortical destruction or definite abnormal T1 signal. These findings are nonspecific could be secondary to hyperemia or early osteomyelitis. He is on amoxicillin as directed by Dr. Earleen Newport. There were plans to do a bone biopsy and culture done in the OR on Friday however the patient canceled this because of insurance issues [surgical center not in [place]. In any case I have told him that this procedure in my opinion needs to be done. He is using Silvadene cream READMISSION 02/07/2020 This is a patient who had a progressive ulceration over the fifth MTP. We had him here in June and into the early July. He was also followed by [name] of podiatry. The day after we last saw him on 7/9 he went underwent a excision of the fifth MTP to aid in wound healing over this area. Pathology did not show osteomyelitis. Since then he has been following with Dr. Earleen Newport. Notable that  earlier this month he presented with worse wound with purulence. CandS showed MSSA and he was treated with doxycycline and Silvadene cream. More recently he has been changed to Augmentin. He is using Betadine to the wound. He has a forefoot offloading shoe at some point in the postoperative. This wound opened in the surgical line. He is here for our review of this. With regards to his  diabetes I spent some time on this today. This was diagnosed in his mid 90s he was on pills for a while and then on insulin. When he was here last time he told me that he thought he had had antipancreatic antibodies that showed he was type I if so I do not see this. He followed for a period of time with Dr. Meredith Pel endocrinology at [facility] who I gather is currently independent practice in [facility]. I have looked through her notes as it applies to what is available in care everywhere and she consistently calls him a type II diabetic. If he had other studies of his diabetes I do not see this. I have therefore changed my label of him to type II ABI in our clinic was 1.41 on the left 9/13; arrives in clinic today with things not looking as good. He has a small linear wound in the incision line but nonviable rolled edges and debris around the circumference. As well it looks as though he had a deep tissue injury over the base of the fifth metatarsal. Raised may be blistered but no open area here. Also concerning erythema around the wound both distally and proximally 9/20; PCR culture I did last time showed high Enterococcus faecalis, medium MRSA and 3 different anaerobic's including medium Peptostreptococcus magnus. I gave him a combination of Augmentin and trimethoprim sulfamethoxazole starting on 9/16 for 10 days. He is tolerating these well He comes in today. His original wound area at the surgical site only has a small crevice we are using endoform here. Not surprisingly the area over the base of the fifth metatarsal has open with a superficial wound. He has a wrap/surgical shoe injury dorsally on the left foot he is asking about a total contact cast and I think what I know about this area suggests that the lateral part of his foot is a weightbearing surface and I think we could certainly justify this. 9/27; patient is finishing his antibiotics. His surgical wound is actually closed the area over the  base of the left fifth metatarsal of is also closed but still with some eschar. 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 As such he actually walks on the outside of his foot. He has been in touch with [facility] Liliane Channel is their orthotic specialist READMISSION 08/20/2020 This is a 55 year old man who is a type II diabetic. 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 He has been in this clinic before with wounds over his left fifth and left fourth met head. He has a history of osteomyelitis in the left fifth met head requiring an excision of the fifth MTP.. The patient tells [place] that he has an AFO brace now to correct the ankle deformity. Sometime in February he developed a callus over the fourth metatarsal head ultimately Dr. Earleen Newport open this area to expose the wound. He used Silvadene cream for a while most recently Promogran. He has had antibiotics as well including doxycycline and  apparently currently Augmentin although I am not certain there is been any cultures. He states the area has not been x-rayed. 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 The met head here is subluxed. His x-ray that we ordered last week was negative for osteomyelitis. Clinically there does not appear to be infection here. No erythema no drainage the wound does not probe to bone 4/4; put a total contact cast on him last week primary dressing of silver collagen he arrives in clinic today with a wound measuring larger and as opposed to last week a direct probe to bone. He is definitely going to need an MRI 4/11; miraculously the divot the probe to bone his filled in this week. Unfortunately his MRI is not till  next Monday. We have been using silver collagen under a total contact cast. He has completed his antibiotics 4/18; no palpable bone. The distal half of this wound is has depth but there is no exposed bone. More proximal is at the level of the surrounding skin. His MRI is being delayed because of insurance issues was supposed to be today then Wednesday and now we are not exactly sure what the date is or what the issue is. In general things look somewhat better. We have been using silver collagen under the total contact cast 4/27; patient presents for removal and placement of TCC. He has tolerated the cast well in the past week. He has no complaints today. This MRI is still pending scheduling. We will not use a silver dressing so in case it does get scheduled he can get this done without issue. 5/2; patient's wound is smaller but with undermining. We finally have approval for his MRI which has been going on for a month or more now. 5/9 the wound is smaller but with above 0.4 cm of depth we still do not have an MRI. No evidence of continued infection. He is tolerating the total contact cast well. 5/16; I think his wound is gradually improving. Medial part of it is deeper than the rest however this does not go to bone. No evidence of surrounding infection. He is tolerating the total contact cast well. Objective Constitutional Patient is hypertensive.. Pulse regular and within target range for patient.Marland Kitchen Respirations regular, non-labored and within target range.. Temperature is normal and within the target range for the patient.Marland Kitchen Appears in no distress. Vitals Time Taken: 11:19 AM, Height: 76 in, Weight: 260 lbs, BMI: 31.6, Temperature: 98 F, Pulse: 74 bpm, Respiratory Rate: 17 breaths/min, Blood Pressure: 145/87 mmHg, Capillary Blood Glucose: 122 mg/dl. General Notes: Wound exam; left foot left fourth metatarsal head. Medial part of this wound is deeper with debris on the surface I used a #3 curette  to remove this as well as some dry flaking skin and callus around the wound margins. Hemostasis with silver nitrate. I was unable to identify any undermining Integumentary (Hair, Skin) Wound #6 status is Open. Original cause of wound was Gradually Appeared. The date acquired was: 07/03/2020. The wound has been in treatment 8 weeks. The wound is located on the Left Metatarsal head fourth. The wound measures 0.5cm length x 0.4cm width x 0.3cm depth; 0.157cm^2 area and 0.047cm^3 volume. There is Fat Layer (Subcutaneous Tissue) exposed. There is no tunneling noted, however, there is undermining  starting at 12:00 and ending at 12:00 with a maximum distance of 0.2cm. There is a medium amount of serosanguineous drainage noted. The wound margin is well defined and not attached to the wound base. There is large (67-100%) pink, pale granulation within the wound bed. There is no necrotic tissue within the wound bed. Assessment Active Problems ICD-10 Type 2 diabetes mellitus with foot ulcer Non-pressure chronic ulcer of other part of left foot with other specified severity Procedures Wound #6 Pre-procedure diagnosis of Wound #6 is a Diabetic Wound/Ulcer of the Lower Extremity located on the Left Metatarsal head fourth .Severity of Tissue Pre Debridement is: Fat layer exposed. There was a Excisional Skin/Subcutaneous Tissue Debridement with a total area of 0.2 sq cm performed by [name]., MD. With the following instrument(s): Curette to remove Non-Viable tissue/material. Material removed includes Subcutaneous Tissue. No specimens were taken. A time out was conducted at 11:45, prior to the start of the procedure. A Moderate amount of bleeding was controlled with Silver Nitrate. The procedure was tolerated well with a pain level of 0 throughout and a pain level of 0 following the procedure. Post Debridement Measurements: 0.5cm length x 0.4cm width x 0.3cm depth; 0.047cm^3 volume. Character of  Wound/Ulcer Post Debridement is improved. Severity of Tissue Post Debridement is: Fat layer exposed. Post procedure Diagnosis Wound #6: Same as Pre-Procedure Pre-procedure diagnosis of Wound #6 is a Diabetic Wound/Ulcer of the Lower Extremity located on the Left Metatarsal head fourth . There was a T Contact otal Cast Procedure by [name]., MD. Post procedure Diagnosis Wound #6: Same as Pre-Procedure Plan Follow-up Appointments: Return Appointment in 1 week. Bathing/ Shower/ Hygiene: May shower with protection but do not get wound dressing(s) wet. Off-Loading: T Contact Cast to Left Lower Extremity otal Additional Orders / Instructions: Other: - Call to schedule appt for MRI WOUND #6: - Metatarsal head fourth Wound Laterality: Left Cleanser: Wound Cleanser 1 x Per Week/7 Days Discharge Instructions: Cleanse the wound with wound cleanser prior to applying a clean dressing using gauze sponges, not tissue or cotton balls. Prim Dressing: Maxorb Extra Calcium Alginate 2x2 in 1 x Per Week/7 Days ary Discharge Instructions: Apply calcium alginate to wound bed as instructed Secondary Dressing: Woven Gauze Sponges 2x2 in (Generic) 1 x Per Week/7 Days Discharge Instructions: Apply over primary dressing as directed. Secondary Dressing: Optifoam Non-Adhesive Dressing, 4x4 in 1 x Per Week/7 Days Discharge Instructions: Apply over primary dressing cut to make foam donut Secured With: 61M Medipore H Soft Cloth Surgical T 4 x 2 (in/yd) 1 x Per Week/7 Days ape Discharge Instructions: Secure dressing with tape as directed. 1. Still using silver alginate. Foam and continuing with a total contact cast 2. MRI apparently in 2 weeks I am not really sure why the delay Electronic Signature(s) Signed: 10/16/2020 4:16:43 PM By: [name] MD Entered By: [name] on 10/15/2020 12:10:44 -------------------------------------------------------------------------------- Total Contact Cast  Details Patient Name: Date of Service: [facility] [place] 0/35/0093 11:00 A M Medical Record Number: [id number] Patient Account Number: [id number] Date of Birth/Sex: Treating RN: 06/28/1965 (55 y.o. [name] Primary Care Provider: [name] Other Clinician: Referring Provider: Treating Provider/Extender: Elwyn Lade in Treatment: 8 T Contact Cast Applied for Wound Assessment: otal Wound #6 Left Metatarsal head fourth Performed By: Physician [name]., MD Post Procedure Diagnosis Same as Pre-procedure Electronic Signature(s) Signed: 10/16/2020 4:16:43 PM By: [name] MD Entered By: [name] on 10/15/2020 12:07:33 -------------------------------------------------------------------------------- SuperBill  Details Patient Name: Date of Service: [facility], [place] 8/55/0158 Medical Record Number: [id number] Patient Account Number: [id number] Date of Birth/Sex: Treating RN: [birth year] (55 y.o. [name] Primary Care Provider: [name] Other Clinician: Referring Provider: Treating Provider/Extender: [name], [name] in Treatment: 8 Diagnosis Coding ICD-10 Codes Code Description [phone] Type 2 diabetes mellitus with foot ulcer L97.528 Non-pressure chronic ulcer of other part of left foot with other specified severity Facility Procedures CPT4 Code: [id number] Description: 28979 - DEB SUBQ TISSUE 20 SQ CM/< ICD-10 Diagnosis Description E11.621 Type 2 diabetes mellitus with foot ulcer Modifier: Quantity: 1 Physician Procedures : CPT4 Code Description Modifier [id number] - WC PHYS SUBQ TISS 20 SQ CM ICD-10 Diagnosis Description E11.621 Type 2 diabetes mellitus with foot ulcer Quantity: 1 Electronic Signature(s) Signed: 10/16/2020 4:16:43 PM By: [name] MD Entered By: [name] on 10/15/2020 12:10:59

## 2020-10-22 ENCOUNTER — Other Ambulatory Visit: Payer: Self-pay

## 2020-10-22 ENCOUNTER — Encounter (HOSPITAL_BASED_OUTPATIENT_CLINIC_OR_DEPARTMENT_OTHER): Payer: BLUE CROSS/BLUE SHIELD | Admitting: Internal Medicine

## 2020-10-22 DIAGNOSIS — E11621 Type 2 diabetes mellitus with foot ulcer: Secondary | ICD-10-CM | POA: Diagnosis not present

## 2020-10-22 DIAGNOSIS — L97528 Non-pressure chronic ulcer of other part of left foot with other specified severity: Secondary | ICD-10-CM

## 2020-10-22 DIAGNOSIS — E10621 Type 1 diabetes mellitus with foot ulcer: Secondary | ICD-10-CM | POA: Diagnosis not present

## 2020-10-22 NOTE — Progress Notes (Addendum)
[name] ([id number]) Visit Report for 10/22/2020 Arrival Information Details Patient Name: Date of Service: [facility], [place] 08/07/6576 10:00 A M Medical Record Number: [id number] Patient Account Number: [id number] Date of Birth/Sex: Treating RN: [birth year] (55 y.o. [name] Primary Care [name]: [name] Other Clinician: Referring [name]: Treating [name]/Extender: [name] in Treatment: 9 Visit Information History Since Last Visit Added or deleted any medications: No Patient Arrived: Ambulatory Any new allergies or adverse reactions: No Arrival Time: 10:31 Had a fall or experienced change in No Accompanied By: self activities of daily living that may affect Transfer Assistance: None risk of falls: Patient Identification Verified: Yes Signs or symptoms of abuse/neglect since last visito No Secondary Verification Process Completed: Yes Hospitalized since last visit: No Patient Requires Transmission-Based Precautions: No Implantable device outside of the clinic excluding No Patient Has Alerts: No cellular tissue based products placed in the center since last visit: Has Dressing in Place as Prescribed: Yes Pain Present Now: No Electronic Signature(s) Signed: 10/22/2020 3:41:[name] PM By: [name] Entered By: [name] on 10/22/2020 10:33:30 -------------------------------------------------------------------------------- Encounter Discharge Information Details Patient Name: Date of Service: [facility], [place]. 4/[name]/2440 10:00 A M Medical Record Number: [id number] Patient Account Number: [id number] Date of Birth/Sex: Treating RN: [name]-02-[name] (55 y.o. [name] Primary Care Madicyn Mesina: [name] Other Clinician: Referring [name]: Treating [name]/Extender: [name] in Treatment: 9 Encounter Discharge Information Items Post Procedure Vitals Discharge Condition:  Stable Temperature (F): 99.3 Ambulatory Status: Walker Pulse (bpm): 73 Discharge Destination: Home Respiratory Rate (breaths/min): 17 Transportation: Private Auto Blood Pressure (mmHg): 123/84 Schedule Follow-up Appointment: Yes Clinical Summary of Care: Provided on 10/22/2020 Form Type Recipient Paper Patient Patient Notes Uses knee scooter Electronic Signature(s) Signed: 10/22/2020 3:15:16 PM By: [name] Entered By: [name] on 10/22/2020 15:15:16 -------------------------------------------------------------------------------- Lower Extremity Assessment Details Patient Name: Date of Service: [facility], [place] 4/40/3474 10:00 A M Medical Record Number: [id number] Patient Account Number: [id number] Date of Birth/Sex: Treating RN: [birth year] (55 y.o. [name] Primary Care Frederico Gerling: [name] Other Clinician: Referring Lanson Randle: Treating [name]/Extender: [name] in Treatment: 9 Edema Assessment Assessed: [Left: No] Patrice Paradise: No] Edema: [Left: N] [Right: o] Calf Left: Right: Point of Measurement: 33 cm From Medial Instep 36 cm Ankle Left: Right: Point of Measurement: 8 cm From Medial Instep 22 cm Vascular Assessment Pulses: Dorsalis Pedis Palpable: [Left:Yes] Electronic Signature(s) Signed: 10/22/2020 5:27:53 PM By: [name] Entered By: Erik Hurst on 10/22/2020 10:57:29 -------------------------------------------------------------------------------- Multi Wound Chart Details Patient Name: Date of Service: [facility], [place] 6/43/3295 10:00 A M Medical Record Number: [id number] Patient Account Number: [id number] Date of Birth/Sex: Treating RN: [name]-[birth year] (55 y.o. [name] Primary Care Uday Jantz: [name] Other Clinician: Referring [name]: Treating [name]/Extender: [name] in Treatment: 9 Vital Signs Height(in): 76 Capillary Blood  Glucose(mg/dl): 117 Weight(lbs): 260 Pulse(bpm): 36 Body Mass Index(BMI): 32 Blood Pressure(mmHg): 123/84 Temperature(F): 99.3 Respiratory Rate(breaths/min): 17 Photos: [6:Left Metatarsal head fourth] [N/A:N/A N/A] Wound Location: [6:Gradually Appeared] [N/A:N/A] Wounding Event: [6:Diabetic Wound/Ulcer of the Lower] [N/A:N/A] Primary Etiology: [6:Extremity Cataracts, Hypertension, Type II] [N/A:N/A] Comorbid History: [6:Diabetes, Osteomyelitis, Neuropathy 07/03/2020] [N/A:N/A] Date Acquired: [6:9] [N/A:N/A] [name] of Treatment: [6:Open] [N/A:N/A] Wound Status: [6:0.2x0.2x0.2] [N/A:N/A] Measurements L x W x D (cm) [6:0.031] [N/A:N/A] A (cm) : rea [6:0.006] [N/A:N/A] Volume (cm) : [6:97.80%] [N/A:N/A] % Reduction in A [6:rea: 98.90%] [N/A:N/A] % Reduction in Volume: [6:Grade  2] [N/A:N/A] Classification: [6:Small] [N/A:N/A] Exudate A mount: [6:Serosanguineous] [N/A:N/A] Exudate Type: [6:red, brown] [N/A:N/A] Exudate Color: [6:Well defined, not attached] [N/A:N/A] Wound Margin: [6:Large (67-100%)] [N/A:N/A] Granulation A mount: [6:Pink] [N/A:N/A] Granulation Quality: [6:None Present (0%)] [N/A:N/A] Necrotic A mount: [6:Fat Layer (Subcutaneous Tissue): Yes N/A] Exposed Structures: [6:Fascia: No Tendon: No Muscle: No Joint: No Bone: No Large (67-100%)] [N/A:N/A] Epithelialization: [6:Debridement - Excisional] [N/A:N/A] Debridement: Pre-procedure Verification/Time Out 11:12 [N/A:N/A] Taken: [6:Callus, Subcutaneous] [N/A:N/A] Tissue Debrided: [6:Skin/Subcutaneous Tissue] [N/A:N/A] Level: [6:0.16] [N/A:N/A] Debridement A (sq cm): [6:rea Curette] [N/A:N/A] Instrument: [6:Minimum] [N/A:N/A] Bleeding: [6:Pressure] [N/A:N/A] Hemostasis A chieved: [6:0] [N/A:N/A] Procedural Pain: [6:0] [N/A:N/A] Post Procedural Pain: [6:Procedure was tolerated well] [N/A:N/A] Debridement Treatment Response: [6:0.2x0.2x0.2] [N/A:N/A] Post Debridement Measurements L x W x D (cm) [6:0.006]  [N/A:N/A] Post Debridement Volume: (cm) [6:Debridement] [N/A:N/A] Procedures Performed: [6:T Contact Cast otal] Treatment Notes Wound #6 (Metatarsal head fourth) Wound Laterality: Left Cleanser Wound Cleanser Discharge Instruction: Cleanse the wound with wound cleanser prior to applying a clean dressing using gauze sponges, not tissue or cotton balls. Peri-Wound Care Topical Primary Dressing Maxorb Extra Calcium Alginate 2x2 in Discharge Instruction: Apply calcium alginate to wound bed as instructed Secondary Dressing Woven Gauze Sponges 2x2 in Discharge Instruction: Apply over primary dressing as directed. Optifoam Non-Adhesive Dressing, 4x4 in Discharge Instruction: Apply over primary dressing cut to make foam donut Secured With [place] [facility] T 4 x 2 (in/yd) ape Discharge Instruction: Secure dressing with tape as directed. Compression Wrap Compression Stockings Add-Ons Electronic Signature(s) Signed: 10/29/2020 4:06:22 PM By: Kalman Shan DO Signed: 11/19/2020 5:36:56 PM By: [name] Entered By: Kalman Shan on 10/29/2020 16:00:36 -------------------------------------------------------------------------------- [place] Details Patient Name: Date of Service: [facility], [place] 2/80/0349 10:00 A M Medical Record Number: [id number] Patient Account Number: [id number] Date of Birth/Sex: Treating RN: Jun [name], [name] (55 y.o. [name] Primary Care Haislee Corso: [name] Other Clinician: Referring Palak Tercero: Treating Teren Zurcher/Extender: Delma Officer in Treatment: [place] reviewed with physician Active Inactive Nutrition Nursing Diagnoses: Impaired glucose control: actual or potential Potential for alteratiion in Nutrition/Potential for imbalanced nutrition Goals: Patient/caregiver agrees to and verbalizes understanding of need to use nutritional supplements  and/or vitamins as prescribed Date Initiated: 08/20/2020 Date Inactivated: 09/17/2020 Target Resolution Date: 09/21/2020 Goal Status: Met Patient/caregiver will maintain therapeutic glucose control Date Initiated: 08/20/2020 Target Resolution Date: 11/16/2020 Goal Status: Active Interventions: Assess HgA1c results as ordered upon admission and as needed Assess patient nutrition upon admission and as needed per policy Provide education on elevated blood sugars and impact on wound healing Provide education on nutrition Treatment Activities: Education provided on Nutrition : 08/20/2020 Notes: Wound/Skin Impairment Nursing Diagnoses: Impaired tissue integrity Knowledge deficit related to ulceration/compromised skin integrity Goals: Patient/caregiver will verbalize understanding of skin care regimen Date Initiated: 08/20/2020 Target Resolution Date: 11/16/2020 Goal Status: Active Ulcer/skin breakdown will have a volume reduction of 30% by week 4 Date Initiated: 08/20/2020 Date Inactivated: 09/17/2020 Target Resolution Date: 09/21/2020 Goal Status: Met Interventions: Assess patient/caregiver ability to obtain necessary supplies Assess patient/caregiver ability to perform ulcer/skin care regimen upon admission and as needed Assess ulceration(s) every visit Provide education on ulcer and skin care Notes: Electronic Signature(s) Signed: 10/22/2020 5:27:53 PM By: [name] Entered By: Erik Hurst on 10/22/2020 11:[name]:26 -------------------------------------------------------------------------------- Pain Assessment Details Patient Name: Date of Service: [facility], [place]. 7/94/8016 10:00 A M Medical Record Number: [id number] Patient Account Number: [id number] Date of Birth/Sex: Treating RN: [name][birth year] (54  y.o. [name]) Erik Hurst Primary Care Lurlene Ronda: [name] Other Clinician: Referring Enio Hornback: Treating Janavia Rottman/Extender: [name]  in Treatment: 9 Active Problems Location of Pain Severity and Description of Pain Patient Has Paino No Site Locations Pain Management and Medication Current Pain Management: Electronic Signature(s) Signed: 10/22/2020 3:41:[name] PM By: [name] Signed: 10/22/2020 5:27:53 PM By: [name] Entered By: [name] on 10/22/2020 10:34:05 -------------------------------------------------------------------------------- Patient/Caregiver Education Details Patient Name: Date of Service: [facility], [name] 4/48/1856DJSHFWY63:78 A M Medical Record Number: [id number] Patient Account Number: [id number] Date of Birth/Gender: Treating RN: [name], [name] (54 y.o. [name] Primary Care Physician: [name] Other Clinician: Referring Physician: Treating Physician/Extender: Delma Officer in Treatment: 9 Education Assessment Education Provided To: Patient Education Topics Provided Wound/Skin Impairment: Methods: Explain/Verbal Responses: State content correctly [employer]) Signed: 10/22/2020 5:27:53 PM By: [name] Entered By: Erik Hurst on 10/22/2020 11:[name]:37 -------------------------------------------------------------------------------- Wound Assessment Details Patient Name: Date of Service: [facility], [place]. 06/29/7865 10:00 A M Medical Record Number: [id number] Patient Account Number: [id number] Date of Birth/Sex: Treating RN: [name][birth year] (55 y.o. [name] Primary Care Tuleen Mandelbaum: [name] Other Clinician: Referring Aneshia Jacquet: Treating Dawnielle Christiana/Extender: [name] in Treatment: 9 Wound Status Wound Number: 6 Primary Diabetic Wound/Ulcer of the Lower Extremity Etiology: Wound Location: Left Metatarsal head fourth Wound Status: Open Wounding Event: Gradually Appeared Comorbid Cataracts, Hypertension, Type II Diabetes, Osteomyelitis, Date Acquired:  07/03/2020 History: Neuropathy [name] Of Treatment: 9 Clustered Wound: No Photos Wound Measurements Length: (cm) 0.2 Width: (cm) 0.2 Depth: (cm) 0.2 Area: (cm) 0.031 Volume: (cm) 0.006 % Reduction in Area: 97.8% % Reduction in Volume: 98.9% Epithelialization: Large (67-100%) Tunneling: No Undermining: No Wound Description Classification: Grade 2 Wound Margin: Well defined, not attached Exudate Amount: Small Exudate Type: Serosanguineous Exudate Color: red, brown Foul Odor After Cleansing: No Slough/Fibrino No Wound Bed Granulation Amount: Large (67-100%) Exposed Structure Granulation Quality: Pink Fascia Exposed: No Necrotic Amount: None Present (0%) Fat Layer (Subcutaneous Tissue) Exposed: Yes Tendon Exposed: No Muscle Exposed: No Joint Exposed: No Bone Exposed: No Electronic Signature(s) Signed: 10/22/2020 5:06:59 PM By: [name] Signed: 10/22/2020 5:27:53 PM By: [name] Entered By: [name] on 10/22/2020 16:32:54 -------------------------------------------------------------------------------- [place] Details Patient Name: Date of Service: [facility], [place]. 11/27/3660 10:00 A M Medical Record Number: [id number] Patient Account Number: [id number] Date of Birth/Sex: Treating RN: [birth year] (55 y.o. [name] Primary Care Shabazz Mckey: [name] Other Clinician: Referring Jamarr Treinen: Treating Sailor Hevia/Extender: [name] in Treatment: 9 Vital Signs Time Taken: 10:33 Temperature (F): 99.3 Height (in): 76 Pulse (bpm): 73 Weight (lbs): 260 Respiratory Rate (breaths/min): 17 Body Mass Index (BMI): 31.6 Blood Pressure (mmHg): 123/84 Capillary Blood Glucose (mg/dl): 117 Reference Range: 80 - 120 mg / dl Electronic Signature(s) Signed: 10/22/2020 3:41:[name] PM By: [name] Entered By: [name] on 10/22/2020 10:33:58

## 2020-10-30 ENCOUNTER — Other Ambulatory Visit: Payer: Self-pay

## 2020-10-30 ENCOUNTER — Encounter (HOSPITAL_BASED_OUTPATIENT_CLINIC_OR_DEPARTMENT_OTHER): Payer: BLUE CROSS/BLUE SHIELD | Admitting: Internal Medicine

## 2020-10-30 DIAGNOSIS — E10621 Type 1 diabetes mellitus with foot ulcer: Secondary | ICD-10-CM | POA: Diagnosis not present

## 2020-10-30 NOTE — Progress Notes (Signed)
DEUNTAE, KOCSIS ([id number]) Visit Report for 10/30/2020 HPI Details Patient Name: Date of Service: [facility], [place] 09/08/8117 12:45 PM Medical Record Number: [id number] Patient Account Number: [id number] Date of Birth/Sex: Treating RN: May 25, 1966 (55 y.o. [name] Primary Care Provider: [name] Other Clinician: Referring Provider: Treating Provider/Extender: [name], [name] in Treatment: 10 History of Present Illness HPI Description: ADMISSION 05/04/2018 This is a 55 year old man who apparently is a type I diabetic confirmed by appropriate serology testing 6 years ago. He is therefore on insulin. His most recent hemoglobin A1c was 10.7 on 03/27/2018. 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 As a result of a combination of this he is insensate and even has reduced sensation in his upper extremities. His current problem started in early July he noted a blister over the left fourth plantar metatarsal head. He saw [name] his podiatrist on 01/07/2018. At that time he had a wound that measured 2 x 1 x 0.4 cm. An x-ray did not show osteomyelitis. It did not appear that he actually followed up. He was admitted to hospital on 03/25/2018 through 03/30/2018 with his left foot painful and swollen. He underwent a surgical IandD by [name]. I believe his culture at that time showed Enterococcus faecalis and a bone culture showed enterococcus avium. He is been reviewed by [name] of infectious disease although I have not reviewed his note and he has been on 6 weeks worth of ertapenem which should finish sometime apparently on December 9.. MRI suggested possibility of septic arthritis but did not comment specifically on osteomyelitis. A more recent wound culture was negative. The patient has been  referred here for consideration among other things of hyperbaric oxygen versus proceeding with a partial ray amputation. This would involve excision of the fourth metatarsal head. The patient is offloading this and a Darco forefoot off loader. I think he is using iodoform packing. 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 Certainly would be a possibility of a total contact cast. I did not see any arterial studies on him however his peripheral pulses are palpable 05/11/2018; patient started his hyperbarics on Friday but did not dive yesterday for personal issues. He saw [name] on 12//19 he notes that during the hospitalization in October 2019 he had an abscess and underwent a bone biopsy with culture that showed group B strep, enterococcus avium and Enterococcus faecalis. There was Prevotella from the abscess. He was put on ertapenem for 6 weeks which he completed. Per [name] notable for the fact that he is not been treated with ampicillin or amoxicillin. His hemoglobin A1c was noted to be high at 10.7. He is started on ampicillin sulbactam every 6 again I think this is for better coverage for the enterococcus. We started him on silver alginate as there is still purulent looking drainage coming out of the wound and there is indeed exposed bone. The patient has not been unwell 05/18/2018; patient continues with hyperbarics he is tolerating this well and has no complaints. He is on Unasyn every 6. We have been using silver alginate to the area over the fourth plantar metatarsal head. He is a poorly controlled diabetic. There is certainly less purulent drainage than when I first saw this man at the beginning of the month. He only sedimentation rate I see on him  was 40 on 03/26/2018. States his blood sugars are under better control in the 120s to 130 range. Follows with his endocrinologist in [facility] I change his primary dressing to Prisma today attempting to increase the  granulation. After I left the room he called [place] back into look at a area that he noticed on the right foot 2 days ago. Very superficial and in the medial aspect of his high plantar arch [secondary to his neuropathy] he has a custom insert in the shoe on the right foot. 05/24/2018; continues with HBO doing well. He saw infectious disease today and is still on Unasyn every 6 hours. Not making a lot of progress on the left foot with Prisma I changed him to silver alginate strips. The new area on the right midfoot he is putting silver alginate on this as well. 06/01/2018; patient remains on Unasyn. Not making a lot of progress with granulation. I am going to put him in a total contact cast use Prisma for now. He has a high co-pay for an advanced treatment option but I suspect feeling in this wound may come to that 06/04/2018; patient back for the obligatory first total contact cast change. We continue to use Prisma 06/08/2018; the patient is tolerating the cast well although he had some loosening over the tibia which rubbed when he walked. There is no lesion here. We have been using Prisma under the total contact cast. He is in hyperbarics which she is tolerating well 06/15/2018; tolerating total contact cast and hyperbarics well. He continues IV antibiotics and for the underlying osteomyelitis as directed by infectious disease. His wound is making really nice progress 1/21; Patient's wound is down in depth. Using silver collagen. Ab's end this week and Pic line is to be d/ced 1/28; 0.8 cm in depth which is more than last time. We have been using silver collagen under total contact cast. He continues in hyperbarics. His IV antibiotics are finished PICC line has been removed 2/4; surprisingly today the area is actually epithelialized. There is no open area. There is however a divot where the epithelium is gone down to close the wound surface. I am not completely convinced that this is going to remain viable  over time but I think time is the best thing to determine this. There is no evidence of surrounding infection. As of today I do not think a repeat MRI or any other procedure is going to be indicative of whether this is going to maintain a closed state. I am going to continue him in a total contact cast. He will complete HBO. Next week I will allow him perhaps to graduate into a Darco forefoot off loader, then a healing sandal. I will then send him back to podiatry to see if they can fashion an offloading shoe if this area remains closed 2/10; the patient was taken out of the cast today the area is healed. He has a divot however this is fully epithelialized and the epithelialized tissue looks healthy. I have put him into a Darco forefoot off loader. He will complete his last 2 hyperbaric treatments. I will see him next week and I may transition him into a surgical shoe versus his own shoe. He is following up with [name] of podiatry and I think a diabetic shoe with custom inserts could be provided with the signature provided by his endocrinologist ADMISSION 6/22 CLINICAL DATA: Foot ulcer, question of osteomyelitis EXAM: MRI OF THE LEFT FOOT WITHOUT CONTRAST TECHNIQUE: Multiplanar,  multisequence MR imaging of the left was performed. No intravenous contrast was administered. COMPARISON: Radiograph same day FINDINGS: Bones/Joint/Cartilage Small cystic changes are seen at the fifth metatarsal head, likely degenerative changes. No marrow edema, osseous fracture, or periosteal reaction are seen throughout the osseous structures. Small joint effusion seen at the fifth MTP joint. There appears to be chronic cortical irregularity with erosive type change seen at the fourth metatarsal head which is unchanged since June 28, 2019, however new since 2019. Ligaments The Lisfranc ligaments and collateral ligaments appear to be intact. Muscles and Tendons Diffusely increased signal seen throughout  the muscles surrounding the forefoot with fatty atrophy. The flexor and extensor tendons appear to be intact. The plantar fascia is intact. Soft tissues Along the dorsal lateral aspect of the fifth metatarsal shaft there is a focal area superficial ulceration measuring approximately 8 mm in transverse dimension. There is overlying skin thickening and subcutaneous edema seen. No sinus tract or loculated fluid collection is noted. IMPRESSION: 1. Small focal area of ulceration along the dorsal lateral aspect of the fifth metatarsal shaft. No definite evidence of acute osteomyelitis, sinus tract, or abscess. 2. Progressive chronic erosive cortical destruction seen at the fourth metatarsal head since 2019, however unchanged since June 28, 2019, which could be from chronic osteomyelitis or prior injury. 3. Small joint effusion at the fifth MTP joint. Electronically Signed By: Prudencio Pair M.D. On: 08/02/2019 26:12 55 year old type I diabetic who we had in clinic in late 2019 to February 2020 with osteomyelitis of his left fourth metatarsal head and a wound in this area. This eventually healed with IV antibiotics and hyperbaric oxygen. He was relatively well until February of this year when he developed I believe 2 wounds on the left fifth met head 1 plantar and 1 more laterally. The one more laterally has since closed over but he has been left with a fairly persistent wound in the plantar left fifth met head. Has been following with [name] of podiatry. For an extended period of time he used Medihoney. He received doxycycline and April at which time a culture showed E. coli and MSSA. An MRI done on August 02, 2019 [shown above] did not show osteomyelitis but did suggest some ongoing destruction at the fourth metatarsal head. He was switched to silver alginate as the primary dressing in May. Earlier this month he received a 2-week course of Bactrim due to erythema around the wound. He was  referred to Dr. Alfred Levins of infectious disease who did not think anything was infected and recommended stopping antibiotics. The patient said he felt really well has you finished up the Bactrim but then yesterday morning he became febrile again and noted more erythema around the wound. He was referred here for our review of this. His temperature today is 99.3. He denies any other infectious symptoms no cough no upper respiratory, no dysuria etc. He has been using silver alginate and offloading this area with a surgical shoe. 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 He apparently had shoulder surgery in the fall 2020 I did not look this up he apparently has hardware in this area. Recent x-ray done in Dr. Pasty Arch office on 6/7 was negative for osteo- ABIs have always been noncompressible. Is not previously been thought that arterial insufficiency is contributing to this. 6/29; patient returns the clinic. I thought he might be going to  see Dr. Earleen Newport ongoing for this wound but he is back today. He did see Dr. Earleen Newport on 11/22/2019 I believe he agreed with the MRI and apparently was making some arrangements to have this done in [facility] which I am fine with if they can compare with the study of March 2. In any case he returns with a clinic of the wound is not as good. He has been using silver alginate. He is still on another antibiotic that he says Dr. Earleen Newport prescribed 7/8; the patient had his MRI which showed a progressive soft tissue ulceration plantar to the fifth MTP joint with increased soft tissue enhancement throughout the small toe no abscess was noted. There was progressive signal changes in the fifth metatarsal head and fifth proximal phalanx without cortical destruction or definite abnormal T1 signal. These findings are nonspecific could be secondary  to hyperemia or early osteomyelitis. He is on amoxicillin as directed by Dr. Earleen Newport. There were plans to do a bone biopsy and culture done in the OR on Friday however the patient canceled this because of insurance issues [surgical center not in [place]. In any case I have told him that this procedure in my opinion needs to be done. He is using Silvadene cream READMISSION 02/07/2020 This is a patient who had a progressive ulceration over the fifth MTP. We had him here in June and into the early July. He was also followed by [name] of podiatry. The day after we last saw him on 7/9 he went underwent a excision of the fifth MTP to aid in wound healing over this area. Pathology did not show osteomyelitis. Since then he has been following with Dr. Earleen Newport. Notable that earlier this month he presented with worse wound with purulence. CandS showed MSSA and he was treated with doxycycline and Silvadene cream. More recently he has been changed to Augmentin. He is using Betadine to the wound. He has a forefoot offloading shoe at some point in the postoperative. This wound opened in the surgical line. He is here for our review of this. With regards to his diabetes I spent some time on this today. This was diagnosed in his mid 30s he was on pills for a while and then on insulin. When he was here last time he told me that he thought he had had antipancreatic antibodies that showed he was type I if so I do not see this. He followed for a period of time with Dr. Meredith Pel endocrinology at [facility] who I gather is currently independent practice in [facility]. I have looked through her notes as it applies to what is available in care everywhere and she consistently calls him a type II diabetic. If he had other studies of his diabetes I do not see this. I have therefore changed my label of him to type II ABI in our clinic was 1.41 on the left 9/13; arrives in clinic today with things not looking as good. He has a  small linear wound in the incision line but nonviable rolled edges and debris around the circumference. As well it looks as though he had a deep tissue injury over the base of the fifth metatarsal. Raised may be blistered but no open area here. Also concerning erythema around the wound both distally and proximally 9/20; PCR culture I did last time showed high Enterococcus faecalis, medium MRSA and 3 different anaerobic's including medium Peptostreptococcus magnus. I gave him a combination of Augmentin and trimethoprim sulfamethoxazole starting on  9/16 for 10 days. He is tolerating these well He comes in today. His original wound area at the surgical site only has a small crevice we are using endoform here. Not surprisingly the area over the base of the fifth metatarsal has open with a superficial wound. He has a wrap/surgical shoe injury dorsally on the left foot he is asking about a total contact cast and I think what I know about this area suggests that the lateral part of his foot is a weightbearing surface and I think we could certainly justify this. 9/27; patient is finishing his antibiotics. His surgical wound is actually closed the area over the base of the left fifth metatarsal of is also closed but still with some eschar. 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 As such he actually walks on the outside of his foot. He has been in touch with [facility] Liliane Channel is their orthotic specialist READMISSION 08/20/2020 This is a 55 year old man who is a type II diabetic. 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 He has been in this clinic before with wounds over his left fifth and left fourth met head. He has a history of osteomyelitis in the left fifth met head requiring an excision of the fifth MTP.. The patient tells [place] that he has an AFO brace now to correct the ankle  deformity. Sometime in February he developed a callus over the fourth metatarsal head ultimately Dr. Earleen Newport open this area to expose the wound. He used Silvadene cream for a while most recently Promogran. He has had antibiotics as well including doxycycline and apparently currently Augmentin although I am not certain there is been any cultures. He states the area has not been x-rayed. 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 The met head here is subluxed. His x-ray that we ordered last week was negative for osteomyelitis. Clinically there does not appear to be infection here. No erythema no drainage the wound does not probe to bone 4/4; put a total contact cast on him last week primary dressing of silver collagen he arrives in clinic today with a wound measuring larger and as opposed to last week a direct probe to bone. He is definitely going to need an MRI 4/11; miraculously the divot the probe to bone his filled in this week. Unfortunately his MRI is not till next Monday. We have been using silver collagen under a total contact cast. He has completed his antibiotics 4/18; no palpable bone. The distal half of this wound is has depth but there is no exposed bone. More proximal is at the level of the surrounding skin. His MRI is being delayed because of insurance issues was supposed to be today then Wednesday and now we are not exactly sure what the date is or what the issue is. In general things look somewhat better. We have been using silver collagen under the total contact cast 4/27; patient presents for removal and placement of TCC. He has tolerated the cast well in the past week. He has no complaints today. This MRI is still pending scheduling. We will not use a silver dressing so in case it does get  scheduled he can  get this done without issue. 5/2; patient's wound is smaller but with undermining. We finally have approval for his MRI which has been going on for a month or more now. 5/9 the wound is smaller but with above 0.4 cm of depth we still do not have an MRI. No evidence of continued infection. He is tolerating the total contact cast well. 5/16; I think his wound is gradually improving. Medial part of it is deeper than the rest however this does not go to bone. No evidence of surrounding infection. He is tolerating the total contact cast well. 5/23; Patient presents for 1 week follow up. He has been tolerating the cast well. He has no issues or complaints today. He denies signs of infection. 5/31; 1 week follow-up under a total contact cast the area over the fourth met head is healed tremendous subluxation of the fourth met head noted there is nothing here but skin over bone and we went over this in great detail. He tells me he has a brace to support his met heads I do not think I have seen this not during this time in clinic anyways. He has insoles Electronic Signature(s) Signed: 10/30/2020 5:02:17 PM By: [name] MD Entered By: [name] on 10/30/2020 13:53:37 -------------------------------------------------------------------------------- Physical Exam Details Patient Name: Date of Service: [facility], [place] 7/82/9562 12:45 PM Medical Record Number: [id number] Patient Account Number: [id number] Date of Birth/Sex: Treating RN: 1965/07/08 (55 y.o. [name] Primary Care Provider: [name] Other Clinician: Referring Provider: Treating Provider/Extender: [name], [name] in Treatment: 10 Notes 1. Left foot open wound to the fourth met head is totally epithelialized. This looks healthy. Minimal callus over the surface of this. The fourth met head itself is subluxed as is the first met head. There is nothing but skin over bone over  the fourth metatarsal head area Electronic Signature(s) Signed: 10/30/2020 5:02:17 PM By: [name] MD Entered By: [name] on 10/30/2020 13:54:21 -------------------------------------------------------------------------------- Physician Orders Details Patient Name: Date of Service: [facility], [place] 6/96/2952 12:45 PM Medical Record Number: [id number] Patient Account Number: [id number] Date of Birth/Sex: Treating RN: 06-30-1965 (55 y.o. [name] Primary Care Provider: [name] Other Clinician: Referring Provider: Treating Provider/Extender: Elwyn Lade in Treatment: 10 Verbal / Phone Orders: No Diagnosis Coding ICD-10 Coding Code Description E11.621 Type 2 diabetes mellitus with foot ulcer L97.528 Non-pressure chronic ulcer of other part of left foot with other specified severity Discharge From [facility] Discharge from [place] healed!! Off-Loading Other: - wear custom shoes for offloading, continue to keep foot padded for protection Electronic Signature(s) Signed: 10/30/2020 5:02:17 PM By: [name] MD Signed: 10/30/2020 5:56:30 PM By: [name] RN, BSN Entered By: [name] on 10/30/2020 13:42:22 -------------------------------------------------------------------------------- Problem List Details Patient Name: Date of Service: [facility], [place] 0/27/2536 12:45 PM Medical Record Number: [id number] Patient Account Number: [id number] Date of Birth/Sex: Treating RN: Nov 21, 1965 (55 y.o. [name] Primary Care Provider: [name] Other Clinician: Referring Provider: Treating Provider/Extender: [name], [name] in Treatment: 10 Active Problems ICD-10 Encounter Code Description Active Date MDM Diagnosis E11.621 Type 2 diabetes mellitus with foot ulcer 08/20/2020 No Yes L97.528 Non-pressure chronic ulcer of other part of left foot with other  specified 08/20/2020 No Yes severity Inactive Problems Resolved Problems Electronic Signature(s) Signed: 10/30/2020 5:02:17 PM By: [name] MD Entered By: [name] on 10/30/2020 13:49:27 -------------------------------------------------------------------------------- Progress Note Details Patient  Name: Date of Service: [facility], [place] 12/08/6281 12:45 PM Medical Record Number: [id number] Patient Account Number: [id number] Date of Birth/Sex: Treating RN: 05/27/1966 (55 y.o. [name] Primary Care Provider: [name] Other Clinician: Referring Provider: Treating Provider/Extender: [name], [name] in Treatment: 10 Subjective History of Present Illness (HPI) ADMISSION 05/04/2018 This is a 55 year old man who apparently is a type I diabetic confirmed by appropriate serology testing 6 years ago. He is therefore on insulin. His most recent hemoglobin A1c was 10.7 on 03/27/2018. 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 As a result of a combination of this he is insensate and even has reduced sensation in his upper extremities. His current problem started in early July he noted a blister over the left fourth plantar metatarsal head. He saw [name] his podiatrist on 01/07/2018. At that time he had a wound that measured 2 x 1 x 0.4 cm. An x-ray did not show osteomyelitis. It did not appear that he actually followed up. He was admitted to hospital on 03/25/2018 through 03/30/2018 with his left foot painful and swollen. He underwent a surgical IandD by [name]. I believe his culture at that time showed Enterococcus faecalis and a bone culture showed enterococcus avium. He is been reviewed by [name] of infectious disease although I have not reviewed his note and he has been on 6 weeks  worth of ertapenem which should finish sometime apparently on December 9.. MRI suggested possibility of septic arthritis but did not comment specifically on osteomyelitis. A more recent wound culture was negative. The patient has been referred here for consideration among other things of hyperbaric oxygen versus proceeding with a partial ray amputation. This would involve excision of the fourth metatarsal head. The patient is offloading this and a Darco forefoot off loader. I think he is using iodoform packing. 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 Certainly would be a possibility of a total contact cast. I did not see any arterial studies on him however his peripheral pulses are palpable 05/11/2018; patient started his hyperbarics on Friday but did not dive yesterday for personal issues. He saw [name] on 12//19 he notes that during the hospitalization in October 2019 he had an abscess and underwent a bone biopsy with culture that showed group B strep, enterococcus avium and Enterococcus faecalis. There was Prevotella from the abscess. He was put on ertapenem for 6 weeks which he completed. Per [name] notable for the fact that he is not been treated with ampicillin or amoxicillin. His hemoglobin A1c was noted to be high at 10.7. He is started on ampicillin sulbactam every 6 again I think this is for better coverage for the enterococcus. We started him on silver alginate as there is still purulent looking drainage coming out of the wound and there is indeed exposed bone. The patient has not been unwell 05/18/2018; patient continues with hyperbarics he is tolerating this well and has no complaints. He is on Unasyn every 6. We have been using silver alginate to the area over the fourth plantar metatarsal head. He is a poorly controlled diabetic. There is certainly less purulent drainage than when I first saw this man at the beginning of the month. He only sedimentation  rate I see on him was 40 on 03/26/2018. States his blood sugars are under  better control in the 120s to 130 range. Follows with his endocrinologist in [facility] I change his primary dressing to Prisma today attempting to increase the granulation. After I left the room he called [place] back into look at a area that he noticed on the right foot 2 days ago. Very superficial and in the medial aspect of his high plantar arch [secondary to his neuropathy] he has a custom insert in the shoe on the right foot. 05/24/2018; continues with HBO doing well. He saw infectious disease today and is still on Unasyn every 6 hours. Not making a lot of progress on the left foot with Prisma I changed him to silver alginate strips. The new area on the right midfoot he is putting silver alginate on this as well. 06/01/2018; patient remains on Unasyn. Not making a lot of progress with granulation. I am going to put him in a total contact cast use Prisma for now. He has a high co-pay for an advanced treatment option but I suspect feeling in this wound may come to that 06/04/2018; patient back for the obligatory first total contact cast change. We continue to use Prisma 06/08/2018; the patient is tolerating the cast well although he had some loosening over the tibia which rubbed when he walked. There is no lesion here. We have been using Prisma under the total contact cast. He is in hyperbarics which she is tolerating well 06/15/2018; tolerating total contact cast and hyperbarics well. He continues IV antibiotics and for the underlying osteomyelitis as directed by infectious disease. His wound is making really nice progress 1/21; Patient's wound is down in depth. Using silver collagen. Ab's end this week and Pic line is to be d/ced 1/28; 0.8 cm in depth which is more than last time. We have been using silver collagen under total contact cast. He continues in hyperbarics. His IV antibiotics are finished PICC line has been  removed 2/4; surprisingly today the area is actually epithelialized. There is no open area. There is however a divot where the epithelium is gone down to close the wound surface. I am not completely convinced that this is going to remain viable over time but I think time is the best thing to determine this. There is no evidence of surrounding infection. As of today I do not think a repeat MRI or any other procedure is going to be indicative of whether this is going to maintain a closed state. I am going to continue him in a total contact cast. He will complete HBO. Next week I will allow him perhaps to graduate into a Darco forefoot off loader, then a healing sandal. I will then send him back to podiatry to see if they can fashion an offloading shoe if this area remains closed 2/10; the patient was taken out of the cast today the area is healed. He has a divot however this is fully epithelialized and the epithelialized tissue looks healthy. I have put him into a Darco forefoot off loader. He will complete his last 2 hyperbaric treatments. I will see him next week and I may transition him into a surgical shoe versus his own shoe. He is following up with [name] of podiatry and I think a diabetic shoe with custom inserts could be provided with the signature provided by his endocrinologist ADMISSION 6/22 CLINICAL DATA: Foot ulcer, question of osteomyelitis EXAM: MRI OF THE LEFT FOOT WITHOUT CONTRAST TECHNIQUE: Multiplanar, multisequence MR imaging of the left was performed. No intravenous contrast  was administered. COMPARISON: Radiograph same day FINDINGS: Bones/Joint/Cartilage Small cystic changes are seen at the fifth metatarsal head, likely degenerative changes. No marrow edema, osseous fracture, or periosteal reaction are seen throughout the osseous structures. Small joint effusion seen at the fifth MTP joint. There appears to be chronic cortical irregularity with erosive type change  seen at the fourth metatarsal head which is unchanged since June 28, 2019, however new since 2019. Ligaments The Lisfranc ligaments and collateral ligaments appear to be intact. Muscles and Tendons Diffusely increased signal seen throughout the muscles surrounding the forefoot with fatty atrophy. The flexor and extensor tendons appear to be intact. The plantar fascia is intact. Soft tissues Along the dorsal lateral aspect of the fifth metatarsal shaft there is a focal area superficial ulceration measuring approximately 8 mm in transverse dimension. There is overlying skin thickening and subcutaneous edema seen. No sinus tract or loculated fluid collection is noted. IMPRESSION: 1. Small focal area of ulceration along the dorsal lateral aspect of the fifth metatarsal shaft. No definite evidence of acute osteomyelitis, sinus tract, or abscess. 2. Progressive chronic erosive cortical destruction seen at the fourth metatarsal head since 2019, however unchanged since June 28, 2019, which could be from chronic osteomyelitis or prior injury. 3. Small joint effusion at the fifth MTP joint. Electronically Signed By: Prudencio Pair M.D. On: 08/02/2019 81:1 55 year old type I diabetic who we had in clinic in late 2019 to February 2020 with osteomyelitis of his left fourth metatarsal head and a wound in this area. This eventually healed with IV antibiotics and hyperbaric oxygen. He was relatively well until February of this year when he developed I believe 2 wounds on the left fifth met head 1 plantar and 1 more laterally. The one more laterally has since closed over but he has been left with a fairly persistent wound in the plantar left fifth met head. Has been following with [name] of podiatry. For an extended period of time he used Medihoney. He received doxycycline and April at which time a culture showed E. coli and MSSA. An MRI done on August 02, 2019 [shown above] did not show  osteomyelitis but did suggest some ongoing destruction at the fourth metatarsal head. He was switched to silver alginate as the primary dressing in May. Earlier this month he received a 2-week course of Bactrim due to erythema around the wound. He was referred to Dr. Alfred Levins of infectious disease who did not think anything was infected and recommended stopping antibiotics. The patient said he felt really well has you finished up the Bactrim but then yesterday morning he became febrile again and noted more erythema around the wound. He was referred here for our review of this. His temperature today is 99.3. He denies any other infectious symptoms no cough no upper respiratory, no dysuria etc. He has been using silver alginate and offloading this area with a surgical shoe. 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 He apparently had shoulder surgery in the fall 2020 I did not look this up he apparently has hardware in this area. Recent x-ray done in Dr. Pasty Arch office on 6/7 was negative for osteo- ABIs have always been noncompressible. Is not previously been thought that arterial insufficiency is contributing to this. 6/29; patient returns the clinic. I thought he might be going to see Dr. Earleen Newport ongoing for this wound but he is back  today. He did see Dr. Earleen Newport on 11/22/2019 I believe he agreed with the MRI and apparently was making some arrangements to have this done in [facility] which I am fine with if they can compare with the study of March 2. In any case he returns with a clinic of the wound is not as good. He has been using silver alginate. He is still on another antibiotic that he says Dr. Earleen Newport prescribed 7/8; the patient had his MRI which showed a progressive soft tissue ulceration plantar to the fifth MTP joint with increased soft tissue  enhancement throughout the small toe no abscess was noted. There was progressive signal changes in the fifth metatarsal head and fifth proximal phalanx without cortical destruction or definite abnormal T1 signal. These findings are nonspecific could be secondary to hyperemia or early osteomyelitis. He is on amoxicillin as directed by Dr. Earleen Newport. There were plans to do a bone biopsy and culture done in the OR on Friday however the patient canceled this because of insurance issues [surgical center not in [place]. In any case I have told him that this procedure in my opinion needs to be done. He is using Silvadene cream READMISSION 02/07/2020 This is a patient who had a progressive ulceration over the fifth MTP. We had him here in June and into the early July. He was also followed by [name] of podiatry. The day after we last saw him on 7/9 he went underwent a excision of the fifth MTP to aid in wound healing over this area. Pathology did not show osteomyelitis. Since then he has been following with Dr. Earleen Newport. Notable that earlier this month he presented with worse wound with purulence. CandS showed MSSA and he was treated with doxycycline and Silvadene cream. More recently he has been changed to Augmentin. He is using Betadine to the wound. He has a forefoot offloading shoe at some point in the postoperative. This wound opened in the surgical line. He is here for our review of this. With regards to his diabetes I spent some time on this today. This was diagnosed in his mid 18s he was on pills for a while and then on insulin. When he was here last time he told me that he thought he had had antipancreatic antibodies that showed he was type I if so I do not see this. He followed for a period of time with Dr. Meredith Pel endocrinology at [facility] who I gather is currently independent practice in [facility]. I have looked through her notes as it applies to what is available in care everywhere and she  consistently calls him a type II diabetic. If he had other studies of his diabetes I do not see this. I have therefore changed my label of him to type II ABI in our clinic was 1.41 on the left 9/13; arrives in clinic today with things not looking as good. He has a small linear wound in the incision line but nonviable rolled edges and debris around the circumference. As well it looks as though he had a deep tissue injury over the base of the fifth metatarsal. Raised may be blistered but no open area here. Also concerning erythema around the wound both distally and proximally 9/20; PCR culture I did last time showed high Enterococcus faecalis, medium MRSA and 3 different anaerobic's including medium Peptostreptococcus magnus. I gave him a combination of Augmentin and trimethoprim sulfamethoxazole starting on 9/16 for 10 days. He is tolerating these well He comes  in today. His original wound area at the surgical site only has a small crevice we are using endoform here. Not surprisingly the area over the base of the fifth metatarsal has open with a superficial wound. He has a wrap/surgical shoe injury dorsally on the left foot he is asking about a total contact cast and I think what I know about this area suggests that the lateral part of his foot is a weightbearing surface and I think we could certainly justify this. 9/27; patient is finishing his antibiotics. His surgical wound is actually closed the area over the base of the left fifth metatarsal of is also closed but still with some eschar. 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 As such he actually walks on the outside of his foot. He [facility] in touch with [facility] Liliane Channel is their orthotic specialist READMISSION 08/20/2020 This is a 55 year old man who is a type II diabetic. 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  He has been in this clinic before with wounds over his left fifth and left fourth met head. He has a history of osteomyelitis in the left fifth met head requiring an excision of the fifth MTP.. The patient tells [place] that he has an AFO brace now to correct the ankle deformity. Sometime in February he developed a callus over the fourth metatarsal head ultimately Dr. Earleen Newport open this area to expose the wound. He used Silvadene cream for a while most recently Promogran. He has had antibiotics as well including doxycycline and apparently currently Augmentin although I am not certain there is been any cultures. He states the area has not been x-rayed. 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 The met head here is subluxed. His x-ray that we ordered last week was negative for osteomyelitis. Clinically there does not appear to be infection here. No erythema no drainage the wound does not probe to bone 4/4; put a total contact cast on him last week primary dressing of silver collagen he arrives in clinic today with a wound measuring larger and as opposed to last week a direct probe to bone. He is definitely going to need an MRI 4/11; miraculously the divot the probe to bone his filled in this week. Unfortunately his MRI is not till next Monday. We have been using silver collagen under a total contact cast. He has completed his antibiotics 4/18; no palpable bone. The distal half of this wound is has depth but there is no exposed bone. More proximal is at the level of the surrounding skin. His MRI is being delayed because of insurance issues was supposed to be today then Wednesday and now we are not exactly sure what the date is or what the issue is. In general things look somewhat better. We have been using silver  collagen under the total contact cast 4/27; patient presents for removal and placement of TCC. He has tolerated the cast well in the past week. He has no complaints today. This MRI is still pending scheduling. We will not use a silver dressing so in case it does get scheduled he can get this done without issue. 5/2; patient's wound is smaller but  with undermining. We finally have approval for his MRI which has been going on for a month or more now. 5/9 the wound is smaller but with above 0.4 cm of depth we still do not have an MRI. No evidence of continued infection. He is tolerating the total contact cast well. 5/16; I think his wound is gradually improving. Medial part of it is deeper than the rest however this does not go to bone. No evidence of surrounding infection. He is tolerating the total contact cast well. 5/23; Patient presents for 1 week follow up. He has been tolerating the cast well. He has no issues or complaints today. He denies signs of infection. 5/31; 1 week follow-up under a total contact cast the area over the fourth met head is healed tremendous subluxation of the fourth met head noted there is nothing here but skin over bone and we went over this in great detail. He tells me he has a brace to support his met heads I do not think I have seen this not during this time in clinic anyways. He has insoles Objective Constitutional Vitals Time Taken: 1:20 PM, Height: 76 in, Weight: 260 lbs, BMI: 31.6, Temperature: 98.1 F, Pulse: 89 bpm, Respiratory Rate: 18 breaths/min, Blood Pressure: 156/90 mmHg. Integumentary (Hair, Skin) Wound #6 status is Open. Original cause of wound was Gradually Appeared. The date acquired was: 07/03/2020. The wound has been in treatment 10 weeks. The wound is located on the Left Metatarsal head fourth. The wound measures 0cm length x 0cm width x 0cm depth; 0cm^2 area and 0cm^3 volume. There is no tunneling or undermining noted. There is a none present  amount of drainage noted. The wound margin is well defined and not attached to the wound base. There is no granulation within the wound bed. There is no necrotic tissue within the wound bed. Assessment Active Problems ICD-10 Type 2 diabetes mellitus with foot ulcer Non-pressure chronic ulcer of other part of left foot with other specified severity Plan Discharge From [facility] Services: Discharge from [place] healed!! Off-Loading: Other: - wear custom shoes for offloading, continue to keep foot padded for protection T put1. The patient can be discharged from the clinic o 2. Callus protector over this area at all times 3. He has custom inserts, a brace but I do not think shoes to adjust this area. 4. I have suggested return to [facility] for routine follow-up measures especially callus buildup in the fourth met head area. 5. As for now he is closed Electronic Signature(s) Signed: 10/30/2020 5:02:17 PM By: [name] MD Entered By: [name] on 10/30/2020 13:55:19 -------------------------------------------------------------------------------- SuperBill Details Patient Name: Date of Service: [facility], [place] 4/69/[id number] Medical Record Number: [id number] Patient Account Number: [id number] Date of Birth/Sex: Treating RN: 1965/06/08 (55 y.o. [name] Primary Care Provider: [name] Other Clinician: Referring Provider: Treating Provider/Extender: [name], [name] in Treatment: 10 Diagnosis Coding ICD-10 Codes Code Description E11.621 Type 2 diabetes mellitus with foot ulcer L97.528 Non-pressure chronic ulcer of other part of left foot with other specified severity Facility Procedures CPT4 Code: [id number] Description: 99213 - WOUND CARE VISIT-LEV 3 EST PT Modifier: Quantity: 1 Physician Procedures : CPT4 Code Description Modifier [id number] - WC PHYS LEVEL 2 - EST PT ICD-10 Diagnosis Description E11.621 Type 2  diabetes mellitus with foot ulcer L97.528 Non-pressure chronic ulcer of other part of left foot with other specified severity Quantity: 1 Electronic Signature(s) Signed: 10/30/2020  5:02:17 PM By: [name] MD Signed: 10/30/2020 5:56:30 PM By: [name] RN, BSN Entered By: [name] on 10/30/2020 14:58:07

## 2020-10-30 NOTE — Progress Notes (Signed)
[name], [name] ([id number]) Visit Report for 10/30/2020 Arrival Information Details Patient Name: Date of Service: [facility], [place] 10/30/2020 12:45 PM Medical Record Number: [id number] Patient Account Number: [id number] Date of Birth/Sex: Treating RN: May 21, 1966 (55 y.o. [name] Primary Care [name]: [name] Other Clinician: Referring [name]: Treating [name]/Extender: [name] in Treatment: 10 Visit Information History Since Last Visit Added or deleted any medications: No Patient Arrived: [name] Any new allergies or adverse reactions: No Arrival Time: 13:20 Had a fall or experienced change in No Accompanied By: self activities of daily living that may affect Transfer Assistance: None risk of falls: Patient Identification Verified: Yes Signs or symptoms of abuse/neglect since last visito No Secondary Verification Process Completed: Yes Hospitalized since last visit: No Patient Requires Transmission-Based Precautions: No Implantable device outside of the clinic excluding No Patient Has Alerts: No cellular tissue based products placed in the center since last visit: Has Dressing in Place as Prescribed: Yes Has Footwear/Offloading in Place as Prescribed: Yes Left: T Contact Cast otal Pain Present Now: No Electronic Signature(s) Signed: 10/30/2020 5:34:16 PM By: [name] Entered By: [name] on 10/30/2020 13:29:43 -------------------------------------------------------------------------------- Clinic Level of Care Assessment Details Patient Name: Date of Service: [facility] 10/30/2020 12:45 PM Medical Record Number: [id number] Patient Account Number: [id number] Date of Birth/Sex: Treating RN: Jun 07, 1965 (55 y.o. [name] Primary Care [name]: [name] Other Clinician: Referring [name]: Treating Rajat [name]/Extender: [name] in Treatment: 10 Clinic Level  of Care Assessment Items TOOL 4 Quantity Score X- 1 0 Use when only an EandM is performed on FOLLOW-UP visit ASSESSMENTS - Nursing Assessment / Reassessment X- 1 10 Reassessment of Co-morbidities (includes updates in patient status) X- 1 5 Reassessment of Adherence to Treatment Plan ASSESSMENTS - Wound and Skin A ssessment / Reassessment X - Simple Wound Assessment / Reassessment - one wound 1 5 []  - 0 Complex Wound Assessment / Reassessment - multiple wounds []  - 0 Dermatologic / Skin Assessment (not related to wound area) ASSESSMENTS - Focused Assessment []  - 0 Circumferential Edema Measurements - multi extremities []  - 0 Nutritional Assessment / Counseling / Intervention X- 1 5 Lower Extremity Assessment (monofilament, tuning fork, pulses) []  - 0 Peripheral Arterial Disease Assessment (using hand held doppler) ASSESSMENTS - Ostomy and/or Continence Assessment and Care []  - 0 Incontinence Assessment and Management []  - 0 Ostomy Care Assessment and Management (repouching, etc.) PROCESS - Coordination of Care X - Simple Patient / Family Education for ongoing care 1 15 []  - 0 Complex (extensive) Patient / Family Education for ongoing care X- 1 10 Staff obtains , Records, T Results / Process Orders est []  - 0 Staff telephones HHA, Nursing Homes / Clarify orders / etc []  - 0 Routine Transfer to another Facility (non-emergent condition) []  - 0 Routine Hospital Admission (non-emergent condition) []  - 0 New Admissions / / Ordering NPWT Apligraf, etc. , []  - 0 Emergency Hospital Admission (emergent condition) X- 1 10 Simple Discharge Coordination []  - 0 Complex (extensive) Discharge Coordination PROCESS - Special Needs []  - 0 Pediatric / Minor Patient Management []  - 0 Isolation Patient Management []  - 0 Hearing / Language / Visual special needs []  - 0 Assessment of Community assistance (transportation, D/C planning, etc.) []  -  0 Additional assistance / Altered mentation []  - 0 Support Surface(s) Assessment (bed, cushion, seat, etc.) INTERVENTIONS - Wound Cleansing / Measurement X - Simple Wound Cleansing - one  wound 1 5 []  - 0 Complex Wound Cleansing - multiple wounds X- 1 5 Wound Imaging (photographs - any number of wounds) []  - 0 Wound Tracing (instead of photographs) X- 1 5 Simple Wound Measurement - one wound []  - 0 Complex Wound Measurement - multiple wounds INTERVENTIONS - Wound Dressings []  - 0 Small Wound Dressing one or multiple wounds []  - 0 Medium Wound Dressing one or multiple wounds []  - 0 Large Wound Dressing one or multiple wounds []  - 0 Application of Medications - topical []  - 0 Application of Medications - injection INTERVENTIONS - Miscellaneous []  - 0 External ear exam []  - 0 Specimen Collection (cultures, biopsies, blood, body fluids, etc.) []  - 0 Specimen(s) / Culture(s) sent or taken to Lab for analysis []  - 0 Patient Transfer (multiple staff / / Similar devices) []  - 0 Simple Staple / Suture removal (25 or less) []  - 0 Complex Staple / Suture removal (26 or more) []  - 0 Hypo / Hyperglycemic Management (close monitor of Blood Glucose) []  - 0 Ankle / Brachial Index (ABI) - do not check if billed separately X- 1 5 Vital Signs Has the patient been seen at the hospital within the last three years: Yes Total Score: 80 Level Of Care: New/Established - Level 3 Electronic Signature(s) Signed: 10/30/2020 5:56:30 PM By: RN, BSN Entered By: on 10/30/2020 14:28:49 -------------------------------------------------------------------------------- Encounter Discharge Information Details Patient Name: Date of Service: [facility], Erik [name] 10/30/2020 12:45 PM Medical Record Number: Patient Account Number: Date of Birth/Sex: Treating RN: [birth year] (55 y.o. Primary Care [name]: Nurse, adult Other  Clinician: Referring [name]: Treating [name]/Extender: in Treatment: 10 Encounter Discharge Information Items Discharge Condition: Stable Ambulatory Status: Ambulatory Discharge Destination: Home Transportation: Private Auto Accompanied By: alone Schedule Follow-up Appointment: Yes Clinical Summary of Care: Patient Declined Electronic Signature(s) Signed: 10/30/2020 5:56:30 PM By: RN, BSN Entered By: on 10/30/2020 14:58:47 -------------------------------------------------------------------------------- Lower Extremity Assessment Details Patient Name: Date of Service: [facility], Erik Abts B. 10/30/2020 12:45 PM Medical Record Number: [facility] Patient Account Number: 11/01/2020 Date of Birth/Sex: Treating RN: [birth year] (55 y.o. 05/11/1966 Primary Care Callahan Peddie: 57 Other Clinician: Referring Clearance Chenault: Treating Kenric Ginger/Extender: [name], [name] in Treatment: 10 Edema Assessment Assessed: [name]: Yes] [name]: No] Edema: [Left: N] [Right: o] Calf Left: Right: Point of Measurement: 33 cm From Medial Instep 37 cm Ankle Left: Right: Point of Measurement: 8 cm From Medial Instep 22.5 cm Vascular Assessment Pulses: Dorsalis Pedis Palpable: [Left:Yes] Electronic Signature(s) Signed: 10/30/2020 5:34:16 PM By: Erik Abts Entered By: Erik Abts on 10/30/2020 13:30:22 -------------------------------------------------------------------------------- Multi Wound Chart Details Patient Name: Date of Service: [facility], Erik [name] 10/30/2020 12:45 PM Medical Record Number: 11/01/2020 Patient Account Number: [id number] Date of Birth/Sex: Treating RN: 06-20-1965 (55 y.o. 59 Primary Care Johntavius Shepard: [name] Other Clinician: Referring Tamar Miano: Treating Aisea Bouldin/Extender: [name], [name] in Treatment: 10 Vital Signs Height(in):  76 Pulse(bpm): 89 Weight(lbs): 260 Blood Pressure(mmHg): 156/90 Body Mass Index(BMI): 32 Temperature(F): 98.1 Respiratory Rate(breaths/min): 18 Photos: [6:No Photos Left Metatarsal head fourth] [N/A:N/A N/A] Wound Location: [6:Gradually Appeared] [N/A:N/A] Wounding Event: [6:Diabetic Wound/Ulcer of the Lower] [N/A:N/A] Primary Etiology: [6:Extremity Cataracts, Hypertension, Type II] [N/A:N/A] Comorbid History: [6:Diabetes, Osteomyelitis, Neuropathy 07/03/2020] [N/A:N/A] Date Acquired: [6:10] [N/A:N/A] Weeks of Treatment: [6:Open] [N/A:N/A] Wound Status: [6:0x0x0] [N/A:N/A] Measurements L x W x D (cm) [6:0] [N/A:N/A] A (cm) :  rea [6:0] [N/A:N/A] Volume (cm) : [6:100.00%] [N/A:N/A] % Reduction in A rea: [6:100.00%] [N/A:N/A] % Reduction in Volume: [6:Grade 2] [N/A:N/A] Classification: [6:None Present] [N/A:N/A] Exudate A mount: [6:Well defined, not attached] [N/A:N/A] Wound Margin: [6:None Present (0%)] [N/A:N/A] Granulation A mount: [6:None Present (0%)] [N/A:N/A] Necrotic A mount: [6:Fascia: No] [N/A:N/A] Exposed Structures: [6:Fat Layer (Subcutaneous Tissue): No Tendon: No Muscle: No Joint: No Bone: No Large (67-100%)] [N/A:N/A] Treatment Notes Electronic Signature(s) Signed: 10/30/2020 5:02:17 PM By: Baltazar Najjarobson, Michael MD Signed: 10/30/2020 5:56:30 PM By: Erik AbtsLynch, Shatara RN, BSN Entered By: Baltazar Najjarobson, Michael on 10/30/2020 13:49:46 -------------------------------------------------------------------------------- Multi-Disciplinary Care Plan Details [facility]: [facility] Westlake[facility]EL [facility] 10/30/2020 12:45 PM Medical Record Number: [id number] Patient Account Number: [id number] Date of Birth/Sex: Treating RN: 04/02/1966 (55 y.o. Erik KollerM) Lynch, Shatara Primary Care Blakelyn Dinges: Erik PilgrimAsres, Alehegn Other Clinician: Referring Kennita Pavlovich: Treating Arlyne Brandes/Extender: Erik Corningobson, Michael Asres, [name] in Treatment: 10 Multidisciplinary Care Plan reviewed with physician Active  Inactive Electronic Signature(s) Signed: 10/30/2020 5:56:30 PM By: Erik AbtsLynch, Shatara RN, BSN Entered By: Erik AbtsLynch, Shatara on 10/30/2020 14:27:08 -------------------------------------------------------------------------------- Pain Assessment Details Patient Name: [facility] RightSA [name] 10/30/2020 12:45 PM Medical Record Number: [id number] Patient Account Number: [id number] Date of Birth/Sex: Treating RN: 04/02/1966 (55 y.o. Erik SoursM) Deaton, Bobbi Primary Care Xzavior Reinig: Erik PilgrimAsres, Alehegn Other Clinician: Referring Essex Perry: Treating Doyel Mulkern/Extender: Lianne Cureobson, Michael Asres, [name] in Treatment: 10 Active Problems Location of Pain Severity and Description of Pain Patient Has Paino No Site Locations Rate the pain. Current Pain Level: 0 Pain Management and Medication Current Pain Management: Medication: No Cold Application: No Rest: No Massage: No Activity: No T.E.N.S.: No Heat Application: No Leg drop or elevation: No Is the Current Pain Management Adequate: Adequate How does your wound impact your activities of daily livingo Sleep: No Bathing: No Appetite: No Relationship With Others: No Bladder Continence: No Emotions: No Bowel Continence: No Work: No Toileting: No Drive: No Dressing: No Hobbies: No Electronic Signature(s) Signed: 10/30/2020 5:34:16 PM By: Erik Stalleaton, Bobbi Entered By: Erik Stalleaton, Bobbi on 10/30/2020 13:30:11 -------------------------------------------------------------------------------- Patient/Caregiver Education Details Patient Name: [facility] Erik DecemberSA [name] 5/31/2022andnbsp12:45 PM Medical Record Number: [id number] Patient Account Number: [id number] Date of Birth/Gender: Treating RN: 04/02/1966 (55 y.o. Erik KollerM) Lynch, Shatara Primary Care Physician: Erik PilgrimAsres, Alehegn Other Clinician: Referring Physician: Treating Physician/Extender: Erik Corningobson, Michael Asres, [name] in Treatment: 10 Education Assessment Education Provided  To: Patient Education Topics Provided Wound/Skin Impairment: Methods: Explain/Verbal Responses: State content correctly Electronic Signature(s) Signed: 10/30/2020 5:56:30 PM By: Erik AbtsLynch, Shatara RN, BSN Entered By: Erik AbtsLynch, Shatara on 10/30/2020 13:33:01 -------------------------------------------------------------------------------- Wound Assessment Details [facility]: [facility] Erik RightSA [name] 10/30/2020 12:45 PM Medical Record Number: [id number] Patient Account Number: [id number] Date of Birth/Sex: Treating RN: 04/02/1966 (55 y.o. Erik SoursM) Deaton, Bobbi Primary Care Zelma Mazariego: Erik PilgrimAsres, Alehegn Other Clinician: Referring Mercedes Valeriano: Treating Rome Schlauch/Extender: Lianne Cureobson, Michael Asres, [name] in Treatment: 10 Wound Status Wound Number: 6 Primary Diabetic Wound/Ulcer of the Lower Extremity Etiology: Wound Location: Left Metatarsal head fourth Wound Status: Healed - Epithelialized Wounding Event: Gradually Appeared Comorbid Cataracts, Hypertension, Type II Diabetes, Osteomyelitis, Date Acquired: 07/03/2020 History: Neuropathy Weeks Of Treatment: 10 Clustered Wound: No Photos Wound Measurements Length: (cm) Width: (cm) Depth: (cm) Area: (cm) Volume: (cm) 0 % Reduction in Area: 100% 0 % Reduction in Volume: 100% 0 Epithelialization: Large (67-100%) 0 Tunneling: No 0 Undermining: No Wound Description Classification: Grade 2 Wound Margin: Well defined, not attached Exudate Amount: None Present Foul Odor After Cleansing: No Slough/Fibrino No Wound Bed  Granulation Amount: None Present (0%) Exposed Structure Necrotic Amount: None Present (0%) Fascia Exposed: No Fat Layer (Subcutaneous Tissue) Exposed: No Tendon Exposed: No Muscle Exposed: No Joint Exposed: No Bone Exposed: No Electronic Signature(s) Signed: 10/30/2020 5:24:49 PM By: Karl Ito Signed: 10/30/2020 5:34:16 PM By: [name] Entered By: Karl Ito on 10/30/2020  16:48:52 -------------------------------------------------------------------------------- Vitals Details Patient Name: Date of Service: [facility], Erik [name] 10/30/2020 12:45 PM Medical Record Number: [id number] Patient Account Number: [id number] Date of Birth/Sex: Treating RN: September 01, 1965 (55 y.o. [name] Primary Care Rockey Guarino: [name] Other Clinician: Referring Kahla Risdon: Treating Amylee Lodato/Extender: [name], [name] in Treatment: 10 Vital Signs Time Taken: 13:20 Temperature (F): 98.1 Height (in): 76 Pulse (bpm): 89 Weight (lbs): 260 Respiratory Rate (breaths/min): 18 Body Mass Index (BMI): 31.6 Blood Pressure (mmHg): 156/90 Reference Range: 80 - 120 mg / dl Electronic Signature(s) Signed: 10/30/2020 5:34:16 PM By: [name] Entered By: [name] on 10/30/2020 13:30:02

## 2020-11-06 ENCOUNTER — Telehealth: Payer: Self-pay | Admitting: *Deleted

## 2020-11-06 NOTE — Telephone Encounter (Signed)
Patient is calling because his wound has opened back up a little(1 millimeter),is out of town until next week. He is staying off of it as much as possible and is wearing the boot.  What other recommendations would he suggest until next visit. Please advise.

## 2020-11-06 NOTE — Telephone Encounter (Signed)
Attempted to call patient, no answer. Left VM to call back.

## 2020-11-16 ENCOUNTER — Ambulatory Visit (INDEPENDENT_AMBULATORY_CARE_PROVIDER_SITE_OTHER): Payer: BLUE CROSS/BLUE SHIELD | Admitting: Podiatry

## 2020-11-16 ENCOUNTER — Other Ambulatory Visit: Payer: Self-pay

## 2020-11-16 DIAGNOSIS — E1149 Type 2 diabetes mellitus with other diabetic neurological complication: Secondary | ICD-10-CM | POA: Diagnosis not present

## 2020-11-16 DIAGNOSIS — L97522 Non-pressure chronic ulcer of other part of left foot with fat layer exposed: Secondary | ICD-10-CM

## 2020-11-16 MED ORDER — GENTAMICIN SULFATE 0.1 % EX CREA: 1.0000 "application " | TOPICAL_CREAM | Freq: Two times a day (BID) | CUTANEOUS | 0 refills | Status: DC

## 2020-11-19 NOTE — Progress Notes (Signed)
[name], [name] ([id number]) Visit Report for 10/22/2020 Chief Complaint Document Details Patient Name: Date of Service: [facility], [place] 12/11/1973 10:00 A M Medical Record Number: [id number] Patient Account Number: [id number] Date of Birth/Sex: Treating RN: [birth year] (55 y.o. [name] Primary Care Provider: Wallene [name] Other Clinician: Referring Provider: Treating Provider/Extender: [name] in Treatment: 9 Information Obtained from: Patient Chief Complaint 05/04/2018; patient is here for review of a wound over the left plantar fourth metatarsal head referred by [name] of podiatric surgery. 11/22/2019; patient is here for review of the wound on the plantar left fifth metatarsal head. She was referred by [name] 08/20/2020; patient is here for review of wound on the plantar fourth met head Electronic Signature(s) Signed: 10/29/2020 4:06:22 PM By: Erik Shan DO Entered By: [name] on 10/29/2020 16:01:15 -------------------------------------------------------------------------------- Debridement Details Patient Name: Date of Service: [facility], [place] 6/41/5830 10:00 A M Medical Record Number: [id number] Patient Account Number: [id number] Date of Birth/Sex: Treating RN: 05-Dec-1965 (55 y.o. [name] Primary Care Provider: Wallene [name] Other Clinician: Referring Provider: Treating Provider/Extender: [name] in Treatment: 9 Debridement Performed for Assessment: Wound #6 Left Metatarsal head fourth Performed By: Physician Erik Shan, DO Debridement Type: Debridement Severity of Tissue Pre Debridement: Fat layer exposed Level of Consciousness (Pre-procedure): Awake and Alert Pre-procedure Verification/Time Out Yes - 11:12 Taken: Start Time: 11:12 T Area Debrided (L x W): otal 0.4 (cm) x 0.4 (cm) = 0.16 (cm) Tissue and other material debrided: Viable, Non-Viable,  Callus, Subcutaneous Level: Skin/Subcutaneous Tissue Debridement Description: Excisional Instrument: Curette Bleeding: Minimum Hemostasis Achieved: Pressure End Time: 11:13 Procedural Pain: 0 Post Procedural Pain: 0 Response to Treatment: Procedure was tolerated well Level of Consciousness (Post- Awake and Alert procedure): Post Debridement Measurements of Total Wound Length: (cm) 0.2 Width: (cm) 0.2 Depth: (cm) 0.2 Volume: (cm) 0.006 Character of Wound/Ulcer Post Debridement: Improved Severity of Tissue Post Debridement: Fat layer exposed Post Procedure Diagnosis Same as Pre-procedure Electronic Signature(s) Signed: 10/22/2020 5:27:53 PM By: Erik Hurst RN, BSN Signed: 10/29/2020 4:06:22 PM By: Erik Shan DO Entered By: [name] on 10/22/2020 11:13:01 -------------------------------------------------------------------------------- HPI Details Patient Name: Date of Service: [facility], [place] 06/11/3157 10:00 A M Medical Record Number: [id number] Patient Account Number: [id number] Date of Birth/Sex: Treating RN: 1966-03-04 (55 y.o. [name] Primary Care Provider: Wallene [name] Other Clinician: Referring Provider: Treating Provider/Extender: [name] in Treatment: 9 History of Present Illness HPI Description: ADMISSION 05/04/2018 This is a 55 year old man who apparently is a type I diabetic confirmed by appropriate serology testing 6 years ago. He is therefore on insulin. His most recent hemoglobin A1c was 10.7 on 03/27/2018. 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 As a result of a combination of this he is insensate and even has reduced sensation in his upper extremities. His current problem started in early July he noted a blister over the left fourth plantar  metatarsal head. He saw [name] his podiatrist on 01/07/2018. At that time he had a wound that measured 2 x 1 x 0.4 cm. An x-ray did not show osteomyelitis. It did not appear that he actually followed up. He was admitted to hospital on 03/25/2018 through 03/30/2018 with his left foot painful and swollen. He underwent a surgical IandD by [name]. I believe  his culture at that time showed Enterococcus faecalis and a bone culture showed enterococcus avium. He is been reviewed by [name] of infectious disease although I have not reviewed his note and he has been on 6 [name] worth of ertapenem which should finish sometime apparently on December 9.. MRI suggested possibility of septic arthritis but did not comment specifically on osteomyelitis. A more recent wound culture was negative. The patient has been referred here for consideration among other things of hyperbaric oxygen versus proceeding with a partial ray amputation. This would involve excision of the fourth metatarsal head. The patient is offloading this and a Darco forefoot off loader. I think he is using iodoform packing. 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 Certainly would be a possibility of a total contact cast. I did not see any arterial studies on him however his peripheral pulses are palpable 05/11/2018; patient started his hyperbarics on Friday but did not dive yesterday for personal issues. He saw [name] on 12//19 he notes that during the hospitalization in October 2019 he had an abscess and underwent a bone biopsy with culture that showed group B strep, enterococcus avium and Enterococcus faecalis. There was Prevotella from the abscess. He was put on ertapenem for 6 [name] which he completed. Per [name] notable for the fact that he is not been treated with ampicillin or amoxicillin. His hemoglobin A1c was noted to be high at 10.7. He is started on ampicillin sulbactam every 6 again I think this  is for better coverage for the enterococcus. We started him on silver alginate as there is still purulent looking drainage coming out of the wound and there is indeed exposed bone. The patient has not been unwell 05/18/2018; patient continues with hyperbarics he is tolerating this well and has no complaints. He is on Unasyn every 6. We have been using silver alginate to the area over the fourth plantar metatarsal head. He is a poorly controlled diabetic. There is certainly less purulent drainage than when I first saw this man at the beginning of the month. He only sedimentation rate I see on him was 40 on 03/26/2018. States his blood sugars are under better control in the 120s to 130 range. Follows with his endocrinologist in [facility] I change his primary dressing to Prisma today attempting to increase the granulation. After I left the room he called [place] back into look at a area that he noticed on the right foot 2 days ago. Very superficial and in the medial aspect of his high plantar arch [secondary to his neuropathy] he has a custom insert in the shoe on the right foot. 05/24/2018; continues with HBO doing well. He saw infectious disease today and is still on Unasyn every 6 hours. Not making a lot of progress on the left foot with Prisma I changed him to silver alginate strips. The new area on the right midfoot he is putting silver alginate on this as well. 06/01/2018; patient remains on Unasyn. Not making a lot of progress with granulation. I am going to put him in a total contact cast use Prisma for now. He has a high co-pay for an advanced treatment option but I suspect feeling in this wound may come to that 06/04/2018; patient back for the obligatory first total contact cast change. We continue to use Prisma 06/08/2018; the patient is tolerating the cast well although he had some loosening over the tibia which rubbed when he walked. There is no  lesion here. We have been using Prisma under the  total contact cast. He is in hyperbarics which she is tolerating well 06/15/2018; tolerating total contact cast and hyperbarics well. He continues IV antibiotics and for the underlying osteomyelitis as directed by infectious disease. His wound is making really nice progress 1/21; Patient's wound is down in depth. Using silver collagen. Ab's end this week and Pic line is to be d/ced 1/28; 0.8 cm in depth which is more than last time. We have been using silver collagen under total contact cast. He continues in hyperbarics. His IV antibiotics are finished PICC line has been removed 2/4; surprisingly today the area is actually epithelialized. There is no open area. There is however a divot where the epithelium is gone down to close the wound surface. I am not completely convinced that this is going to remain viable over time but I think time is the best thing to determine this. There is no evidence of surrounding infection. As of today I do not think a repeat MRI or any other procedure is going to be indicative of whether this is going to maintain a closed state. I am going to continue him in a total contact cast. He will complete HBO. Next week I will allow him perhaps to graduate into a Darco forefoot off loader, then a healing sandal. I will then send him back to podiatry to see if they can fashion an offloading shoe if this area remains closed 2/10; the patient was taken out of the cast today the area is healed. He has a divot however this is fully epithelialized and the epithelialized tissue looks healthy. I have put him into a Darco forefoot off loader. He will complete his last 2 hyperbaric treatments. I will see him next week and I may transition him into a surgical shoe versus his own shoe. He is following up with [name] of podiatry and I think a diabetic shoe with custom inserts could be provided with the signature provided by his endocrinologist ADMISSION 6/22 CLINICAL DATA: Foot ulcer,  question of osteomyelitis EXAM: MRI OF THE LEFT FOOT WITHOUT CONTRAST TECHNIQUE: Multiplanar, multisequence MR imaging of the left was performed. No intravenous contrast was administered. COMPARISON: Radiograph same day FINDINGS: Bones/Joint/Cartilage Small cystic changes are seen at the fifth metatarsal head, likely degenerative changes. No marrow edema, osseous fracture, or periosteal reaction are seen throughout the osseous structures. Small joint effusion seen at the fifth MTP joint. There appears to be chronic cortical irregularity with erosive type change seen at the fourth metatarsal head which is unchanged since June 28, 2019, however new since 2019. Ligaments The Lisfranc ligaments and collateral ligaments appear to be intact. Muscles and Tendons Diffusely increased signal seen throughout the muscles surrounding the forefoot with fatty atrophy. The flexor and extensor tendons appear to be intact. The plantar fascia is intact. Soft tissues Along the dorsal lateral aspect of the fifth metatarsal shaft there is a focal area superficial ulceration measuring approximately 8 mm in transverse dimension. There is overlying skin thickening and subcutaneous edema seen. No sinus tract or loculated fluid collection is noted. IMPRESSION: 1. Small focal area of ulceration along the dorsal lateral aspect of the fifth metatarsal shaft. No definite evidence of acute osteomyelitis, sinus tract, or abscess. 2. Progressive chronic erosive cortical destruction seen at the fourth metatarsal head since 2019, however unchanged since June 28, 2019, which could be from chronic osteomyelitis or prior injury. 3. Small joint effusion at the fifth MTP joint.  Electronically Signed By: Prudencio Pair M.D. On: 08/02/2019 40:64 55 year old type I diabetic who we had in clinic in late 2019 to February 2020 with osteomyelitis of his left fourth metatarsal head and a wound in this area.  This eventually healed with IV antibiotics and hyperbaric oxygen. He was relatively well until February of this year when he developed I believe 2 wounds on the left fifth met head 1 plantar and 1 more laterally. The one more laterally has since closed over but he has been left with a fairly persistent wound in the plantar left fifth met head. Has been following with [name] of podiatry. For an extended period of time he used Medihoney. He received doxycycline and April at which time a culture showed E. coli and MSSA. An MRI done on August 02, 2019 [shown above] did not show osteomyelitis but did suggest some ongoing destruction at the fourth metatarsal head. He was switched to silver alginate as the primary dressing in May. Earlier this month he received a 2-week course of Bactrim due to erythema around the wound. He was referred to Dr. Alfred Levins of infectious disease who did not think anything was infected and recommended stopping antibiotics. The patient said he felt really well has you finished up the Bactrim but then yesterday morning he became febrile again and noted more erythema around the wound. He was referred here for our review of this. His temperature today is 99.3. He denies any other infectious symptoms no cough no upper respiratory, no dysuria etc. He has been using silver alginate and offloading this area with a surgical shoe. 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 He apparently had shoulder surgery in the fall 2020 I did not look this up he apparently has hardware in this area. Recent x-ray done in Dr. Pasty Arch office on 6/7 was negative for osteo- ABIs have always been noncompressible. Is not previously been thought that arterial insufficiency is contributing to this. 6/29; patient returns the clinic. I thought he might be going to see  Dr. Earleen Newport ongoing for this wound but he is back today. He did see Dr. Earleen Newport on 11/22/2019 I believe he agreed with the MRI and apparently was making some arrangements to have this done in [facility] which I am fine with if they can compare with the study of March 2. In any case he returns with a clinic of the wound is not as good. He has been using silver alginate. He is still on another antibiotic that he says Dr. Earleen Newport prescribed 7/8; the patient had his MRI which showed a progressive soft tissue ulceration plantar to the fifth MTP joint with increased soft tissue enhancement throughout the small toe no abscess was noted. There was progressive signal changes in the fifth metatarsal head and fifth proximal phalanx without cortical destruction or definite abnormal T1 signal. These findings are nonspecific could be secondary to hyperemia or early osteomyelitis. He is on amoxicillin as directed by Dr. Earleen Newport. There were plans to do a bone biopsy and culture done in the OR on Friday however the patient canceled this because of insurance issues [surgical center not in [place]. In any case I have told him that this procedure in my opinion needs to be done. He is using Silvadene cream READMISSION 02/07/2020 This is a patient who had a progressive ulceration over the fifth MTP. We had him here  in June and into the early July. He was also followed by [name] of podiatry. The day after we last saw him on 7/9 he went underwent a excision of the fifth MTP to aid in wound healing over this area. Pathology did not show osteomyelitis. Since then he has been following with Dr. Earleen Newport. Notable that earlier this month he presented with worse wound with purulence. CandS showed MSSA and he was treated with doxycycline and Silvadene cream. More recently he has been changed to Augmentin. He is using Betadine to the wound. He has a forefoot offloading shoe at some point in the postoperative. This wound opened in the  surgical line. He is here for our review of this. With regards to his diabetes I spent some time on this today. This was diagnosed in his mid 32s he was on pills for a while and then on insulin. When he was here last time he told me that he thought he had had antipancreatic antibodies that showed he was type I if so I do not see this. He followed for a period of time with Dr. Meredith Pel endocrinology at [facility] who I gather is currently independent practice in [facility]. I have looked through her notes as it applies to what is available in care everywhere and she consistently calls him a type II diabetic. If he had other studies of his diabetes I do not see this. I have therefore changed my label of him to type II ABI in our clinic was 1.41 on the left 9/13; arrives in clinic today with things not looking as good. He has a small linear wound in the incision line but nonviable rolled edges and debris around the circumference. As well it looks as though he had a deep tissue injury over the base of the fifth metatarsal. Raised may be blistered but no open area here. Also concerning erythema around the wound both distally and proximally 9/20; PCR culture I did last time showed high Enterococcus faecalis, medium MRSA and 3 different anaerobic's including medium Peptostreptococcus magnus. I gave him a combination of Augmentin and trimethoprim sulfamethoxazole starting on 9/16 for 10 days. He is tolerating these well He comes in today. His original wound area at the surgical site only has a small crevice we are using endoform here. Not surprisingly the area over the base of the fifth metatarsal has open with a superficial wound. He has a wrap/surgical shoe injury dorsally on the left foot he is asking about a total contact cast and I think what I know about this area suggests that the lateral part of his foot is a weightbearing surface and I think we could certainly justify this. 9/27; patient is finishing  his antibiotics. His surgical wound is actually closed the area over the base of the left fifth metatarsal of is also closed but still with some eschar. 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 As such he actually walks on the outside of his foot. He has been in touch with [facility] Liliane Channel is their orthotic specialist READMISSION 08/20/2020 This is a 55 year old man who is a type II diabetic. 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 He has been in this clinic before with wounds over his left fifth and left fourth met head. He has a history of osteomyelitis in the left fifth met head requiring an excision of the fifth  MTP.. The patient tells [place] that he has an AFO brace now to correct the ankle deformity. Sometime in February he developed a callus over the fourth metatarsal head ultimately Dr. Earleen Newport open this area to expose the wound. He used Silvadene cream for a while most recently Promogran. He has had antibiotics as well including doxycycline and apparently currently Augmentin although I am not certain there is been any cultures. He states the area has not been x-rayed. 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 The met head here is subluxed. His x-ray that we ordered last week was negative for osteomyelitis. Clinically there does not appear to be infection here. No erythema no drainage the wound does not probe to bone 4/4; put a total contact cast on him last week primary dressing of silver collagen he arrives in clinic today with a wound measuring larger and as opposed to last week a direct probe to bone. He is definitely going to need an MRI 4/11; miraculously the divot the  probe to bone his filled in this week. Unfortunately his MRI is not till next Monday. We have been using silver collagen under a total contact cast. He has completed his antibiotics 4/18; no palpable bone. The distal half of this wound is has depth but there is no exposed bone. More proximal is at the level of the surrounding skin. His MRI is being delayed because of insurance issues was supposed to be today then Wednesday and now we are not exactly sure what the date is or what the issue is. In general things look somewhat better. We have been using silver collagen under the total contact cast 4/27; patient presents for removal and placement of TCC. He has tolerated the cast well in the past week. He has no complaints today. This MRI is still pending scheduling. We will not use a silver dressing so in case it does get scheduled he can get this done without issue. 5/2; patient's wound is smaller but with undermining. We finally have approval for his MRI which has been going on for a month or more now. 5/9 the wound is smaller but with above 0.4 cm of depth we still do not have an MRI. No evidence of continued infection. He is tolerating the total contact cast well. 5/16; I think his wound is gradually improving. Medial part of it is deeper than the rest however this does not go to bone. No evidence of surrounding infection. He is tolerating the total contact cast well. 5/23; Patient presents for 1 week follow up. He has been tolerating the cast well. He has no issues or complaints today. He denies signs of infection. Electronic Signature(s) Signed: 10/29/2020 4:06:22 PM By: Erik Shan DO Entered By: [name] on 10/29/2020 16:02:15 -------------------------------------------------------------------------------- Physical Exam Details Patient Name: Date of Service: [facility], [place]. 5/88/5027 10:00 A M Medical Record Number: [id number] Patient Account Number: [id number] Date of  Birth/Sex: Treating RN: 02/28/1966 (55 y.o. [name] Primary Care Provider: Wallene [name] Other Clinician: Referring Provider: Treating Provider/Extender: [name] in Treatment: 9 Constitutional respirations regular, non-labored and within target range for patient.Marland Kitchen Psychiatric pleasant and cooperative. Notes Left Foot: open wound to the 4th met head. No signs of infection. Callus and non viable tissue  present. Post debridement granulation tissue present. Electronic Signature(s) Signed: 10/29/2020 4:06:22 PM By: Erik Shan DO Entered By: [name] on 10/29/2020 16:03:25 -------------------------------------------------------------------------------- Physician Orders Details Patient Name: Date of Service: [facility], [place] 0/16/0109 10:00 A M Medical Record Number: [id number] Patient Account Number: [id number] Date of Birth/Sex: Treating RN: 20-Nov-1965 (55 y.o. [name] Primary Care Provider: Wallene [name] Other Clinician: Referring Provider: Treating Provider/Extender: [name] in Treatment: 9 Verbal / Phone Orders: No Diagnosis Coding ICD-10 Coding Code Description E11.621 Type 2 diabetes mellitus with foot ulcer L97.528 Non-pressure chronic ulcer of other part of left foot with other specified severity Follow-up Appointments ppointment in 1 week. - with Dr. Dellia Nims - ***60 minutes for cast*** Return A Bathing/ Shower/ Hygiene May shower with protection but do not get wound dressing(s) wet. Off-Loading Total Contact Cast to Left Lower Extremity Additional Orders / Instructions Other: - Call to schedule appt for MRI Wound Treatment Wound #6 - Metatarsal head fourth Wound Laterality: Left Cleanser: Wound Cleanser 1 x Per Week/7 Days Discharge Instructions: Cleanse the wound with wound cleanser prior to applying a clean dressing using gauze sponges, not tissue or cotton  balls. Prim Dressing: Maxorb Extra Calcium Alginate 2x2 in 1 x Per Week/7 Days ary Discharge Instructions: Apply calcium alginate to wound bed as instructed Secondary Dressing: Woven Gauze Sponges 2x2 in (Generic) 1 x Per Week/7 Days Discharge Instructions: Apply over primary dressing as directed. Secondary Dressing: Optifoam Non-Adhesive Dressing, 4x4 in 1 x Per Week/7 Days Discharge Instructions: Apply over primary dressing cut to make foam donut Secured With: 32M Medipore H Soft Cloth Surgical T 4 x 2 (in/yd) 1 x Per Week/7 Days ape Discharge Instructions: Secure dressing with tape as directed. Electronic Signature(s) Signed: 10/29/2020 4:06:22 PM By: Erik Shan DO Previous Signature: 10/22/2020 5:27:53 PM Version By: Erik Hurst RN, BSN Entered By: [name] on 10/29/2020 16:03:55 -------------------------------------------------------------------------------- Problem List Details Patient Name: Date of Service: [facility], [place]. 0/25/4270 10:00 A M Medical Record Number: [id number] Patient Account Number: [id number] Date of Birth/Sex: Treating RN: 08-31-1965 (55 y.o. [name] Primary Care Provider: Wallene [name] Other Clinician: Referring Provider: Treating Provider/Extender: [name] in Treatment: 9 Active Problems ICD-10 Encounter Code Description Active Date MDM Diagnosis E11.621 Type 2 diabetes mellitus with foot ulcer 08/20/2020 No Yes L97.528 Non-pressure chronic ulcer of other part of left foot with other specified 08/20/2020 No Yes severity Inactive Problems Resolved Problems Electronic Signature(s) Signed: 10/29/2020 4:06:22 PM By: Erik Shan DO Previous Signature: 10/22/2020 5:27:53 PM Version By: Erik Hurst RN, BSN Entered By: [name] on 10/29/2020 16:00:09 -------------------------------------------------------------------------------- Progress Note Details Patient Name: Date of  Service: [facility], [place] 10/17/6158 10:00 A M Medical Record Number: [id number] Patient Account Number: [id number] Date of Birth/Sex: Treating RN: 03/13/1966 (55 y.o. [name] Primary Care Provider: Wallene [name] Other Clinician: Referring Provider: Treating Provider/Extender: [name] in Treatment: 9 Subjective Chief Complaint Information obtained from Patient 05/04/2018; patient is here for review of a wound over the left plantar fourth metatarsal head referred by [name] of podiatric surgery. 11/22/2019; patient is here for review of the wound on the plantar left fifth metatarsal head. She was referred by [name] 08/20/2020; patient is here for review of wound on the plantar fourth met head History of Present Illness (HPI) ADMISSION 05/04/2018 This is a 55 year old man who apparently is a type I diabetic confirmed by appropriate serology  testing 6 years ago. He is therefore on insulin. His most recent hemoglobin A1c was 10.7 on 03/27/2018. 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 As a result of a combination of this he is insensate and even has reduced sensation in his upper extremities. His current problem started in early July he noted a blister over the left fourth plantar metatarsal head. He saw [name] his podiatrist on 01/07/2018. At that time he had a wound that measured 2 x 1 x 0.4 cm. An x-ray did not show osteomyelitis. It did not appear that he actually followed up. He was admitted to hospital on 03/25/2018 through 03/30/2018 with his left foot painful and swollen. He underwent a surgical IandD by [name]. I believe his culture at that time showed Enterococcus faecalis and a bone culture showed enterococcus avium. He is been reviewed by [name] of infectious  disease although I have not reviewed his note and he has been on 6 [name] worth of ertapenem which should finish sometime apparently on December 9.. MRI suggested possibility of septic arthritis but did not comment specifically on osteomyelitis. A more recent wound culture was negative. The patient has been referred here for consideration among other things of hyperbaric oxygen versus proceeding with a partial ray amputation. This would involve excision of the fourth metatarsal head. The patient is offloading this and a Darco forefoot off loader. I think he is using iodoform packing. 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 Certainly would be a possibility of a total contact cast. I did not see any arterial studies on him however his peripheral pulses are palpable 05/11/2018; patient started his hyperbarics on Friday but did not dive yesterday for personal issues. He saw [name] on 12//19 he notes that during the hospitalization in October 2019 he had an abscess and underwent a bone biopsy with culture that showed group B strep, enterococcus avium and Enterococcus faecalis. There was Prevotella from the abscess. He was put on ertapenem for 6 [name] which he completed. Per [name] notable for the fact that he is not been treated with ampicillin or amoxicillin. His hemoglobin A1c was noted to be high at 10.7. He is started on ampicillin sulbactam every 6 again I think this is for better coverage for the enterococcus. We started him on silver alginate as there is still purulent looking drainage coming out of the wound and there is indeed exposed bone. The patient has not been unwell 05/18/2018; patient continues with hyperbarics he is tolerating this well and has no complaints. He is on Unasyn every 6. We have been using silver alginate to the area over the fourth plantar metatarsal head. He is a poorly controlled diabetic. There is certainly less purulent drainage than when I  first saw this man at the beginning of the month. He only sedimentation rate I see on him was 40 on 03/26/2018. States his blood sugars are under better control in the 120s to 130 range. Follows with his endocrinologist in [facility] I change his primary dressing to Prisma today attempting to increase the granulation. After I left the room he called [place] back into look at a area that he noticed on the right foot 2 days ago. Very superficial and in the medial aspect of his high plantar arch [secondary to his neuropathy] he has a custom insert in  the shoe on the right foot. 05/24/2018; continues with HBO doing well. He saw infectious disease today and is still on Unasyn every 6 hours. Not making a lot of progress on the left foot with Prisma I changed him to silver alginate strips. The new area on the right midfoot he is putting silver alginate on this as well. 06/01/2018; patient remains on Unasyn. Not making a lot of progress with granulation. I am going to put him in a total contact cast use Prisma for now. He has a high co-pay for an advanced treatment option but I suspect feeling in this wound may come to that 06/04/2018; patient back for the obligatory first total contact cast change. We continue to use Prisma 06/08/2018; the patient is tolerating the cast well although he had some loosening over the tibia which rubbed when he walked. There is no lesion here. We have been using Prisma under the total contact cast. He is in hyperbarics which she is tolerating well 06/15/2018; tolerating total contact cast and hyperbarics well. He continues IV antibiotics and for the underlying osteomyelitis as directed by infectious disease. His wound is making really nice progress 1/21; Patient's wound is down in depth. Using silver collagen. Ab's end this week and Pic line is to be d/ced 1/28; 0.8 cm in depth which is more than last time. We have been using silver collagen under total contact cast. He continues in  hyperbarics. His IV antibiotics are finished PICC line has been removed 2/4; surprisingly today the area is actually epithelialized. There is no open area. There is however a divot where the epithelium is gone down to close the wound surface. I am not completely convinced that this is going to remain viable over time but I think time is the best thing to determine this. There is no evidence of surrounding infection. As of today I do not think a repeat MRI or any other procedure is going to be indicative of whether this is going to maintain a closed state. I am going to continue him in a total contact cast. He will complete HBO. Next week I will allow him perhaps to graduate into a Darco forefoot off loader, then a healing sandal. I will then send him back to podiatry to see if they can fashion an offloading shoe if this area remains closed 2/10; the patient was taken out of the cast today the area is healed. He has a divot however this is fully epithelialized and the epithelialized tissue looks healthy. I have put him into a Darco forefoot off loader. He will complete his last 2 hyperbaric treatments. I will see him next week and I may transition him into a surgical shoe versus his own shoe. He is following up with [name] of podiatry and I think a diabetic shoe with custom inserts could be provided with the signature provided by his endocrinologist ADMISSION 6/22 CLINICAL DATA: Foot ulcer, question of osteomyelitis EXAM: MRI OF THE LEFT FOOT WITHOUT CONTRAST TECHNIQUE: Multiplanar, multisequence MR imaging of the left was performed. No intravenous contrast was administered. COMPARISON: Radiograph same day FINDINGS: Bones/Joint/Cartilage Small cystic changes are seen at the fifth metatarsal head, likely degenerative changes. No marrow edema, osseous fracture, or periosteal reaction are seen throughout the osseous structures. Small joint effusion seen at the fifth MTP joint. There appears  to be chronic cortical irregularity with erosive type change seen at the fourth metatarsal head which is unchanged since June 28, 2019, however new since 2019. Ligaments  The Lisfranc ligaments and collateral ligaments appear to be intact. Muscles and Tendons Diffusely increased signal seen throughout the muscles surrounding the forefoot with fatty atrophy. The flexor and extensor tendons appear to be intact. The plantar fascia is intact. Soft tissues Along the dorsal lateral aspect of the fifth metatarsal shaft there is a focal area superficial ulceration measuring approximately 8 mm in transverse dimension. There is overlying skin thickening and subcutaneous edema seen. No sinus tract or loculated fluid collection is noted. IMPRESSION: 1. Small focal area of ulceration along the dorsal lateral aspect of the fifth metatarsal shaft. No definite evidence of acute osteomyelitis, sinus tract, or abscess. 2. Progressive chronic erosive cortical destruction seen at the fourth metatarsal head since 2019, however unchanged since June 28, 2019, which could be from chronic osteomyelitis or prior injury. 3. Small joint effusion at the fifth MTP joint. Electronically Signed By: Prudencio Pair M.D. On: 08/02/2019 62:55 55 year old type I diabetic who we had in clinic in late 2019 to February 2020 with osteomyelitis of his left fourth metatarsal head and a wound in this area. This eventually healed with IV antibiotics and hyperbaric oxygen. He was relatively well until February of this year when he developed I believe 2 wounds on the left fifth met head 1 plantar and 1 more laterally. The one more laterally has since closed over but he has been left with a fairly persistent wound in the plantar left fifth met head. Has been following with [name] of podiatry. For an extended period of time he used Medihoney. He received doxycycline and April at which time a culture showed E. coli and MSSA. An  MRI done on August 02, 2019 [shown above] did not show osteomyelitis but did suggest some ongoing destruction at the fourth metatarsal head. He was switched to silver alginate as the primary dressing in May. Earlier this month he received a 2-week course of Bactrim due to erythema around the wound. He was referred to Dr. Alfred Levins of infectious disease who did not think anything was infected and recommended stopping antibiotics. The patient said he felt really well has you finished up the Bactrim but then yesterday morning he became febrile again and noted more erythema around the wound. He was referred here for our review of this. His temperature today is 99.3. He denies any other infectious symptoms no cough no upper respiratory, no dysuria etc. He has been using silver alginate and offloading this area with a surgical shoe. 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 He apparently had shoulder surgery in the fall 2020 I did not look this up he apparently has hardware in this area. Recent x-ray done in Dr. Pasty Arch office on 6/7 was negative for osteo- ABIs have always been noncompressible. Is not previously been thought that arterial insufficiency is contributing to this. 6/29; patient returns the clinic. I thought he might be going to see Dr. Earleen Newport ongoing for this wound but he is back today. He did see Dr. Earleen Newport on 11/22/2019 I believe he agreed with the MRI and apparently was making some arrangements to have this done in [facility] which I am fine with if they can compare with the study of March 2. In any case he returns with a clinic of the wound is not as good. He has been using silver alginate. He is still on another antibiotic that he says Dr.  Wagner prescribed 7/8; the patient had his MRI which showed a progressive soft tissue ulceration plantar to the  fifth MTP joint with increased soft tissue enhancement throughout the small toe no abscess was noted. There was progressive signal changes in the fifth metatarsal head and fifth proximal phalanx without cortical destruction or definite abnormal T1 signal. These findings are nonspecific could be secondary to hyperemia or early osteomyelitis. He is on amoxicillin as directed by Dr. Earleen Newport. There were plans to do a bone biopsy and culture done in the OR on Friday however the patient canceled this because of insurance issues [surgical center not in [place]. In any case I have told him that this procedure in my opinion needs to be done. He is using Silvadene cream READMISSION 02/07/2020 This is a patient who had a progressive ulceration over the fifth MTP. We had him here in June and into the early July. He was also followed by [name] of podiatry. The day after we last saw him on 7/9 he went underwent a excision of the fifth MTP to aid in wound healing over this area. Pathology did not show osteomyelitis. Since then he has been following with Dr. Earleen Newport. Notable that earlier this month he presented with worse wound with purulence. CandS showed MSSA and he was treated with doxycycline and Silvadene cream. More recently he has been changed to Augmentin. He is using Betadine to the wound. He has a forefoot offloading shoe at some point in the postoperative. This wound opened in the surgical line. He is here for our review of this. With regards to his diabetes I spent some time on this today. This was diagnosed in his mid 46s he was on pills for a while and then on insulin. When he was here last time he told me that he thought he had had antipancreatic antibodies that showed he was type I if so I do not see this. He followed for a period of time with Dr. Meredith Pel endocrinology at [facility] who I gather is currently independent practice in [facility]. I have looked through her notes as it applies to what is  available in care everywhere and she consistently calls him a type II diabetic. If he had other studies of his diabetes I do not see this. I have therefore changed my label of him to type II ABI in our clinic was 1.41 on the left 9/13; arrives in clinic today with things not looking as good. He has a small linear wound in the incision line but nonviable rolled edges and debris around the circumference. As well it looks as though he had a deep tissue injury over the base of the fifth metatarsal. Raised may be blistered but no open area here. Also concerning erythema around the wound both distally and proximally 9/20; PCR culture I did last time showed high Enterococcus faecalis, medium MRSA and 3 different anaerobic's including medium Peptostreptococcus magnus. I gave him a combination of Augmentin and trimethoprim sulfamethoxazole starting on 9/16 for 10 days. He is tolerating these well He comes in today. His original wound area at the surgical site only has a small crevice we are using endoform here. Not surprisingly the area over the base of the fifth metatarsal has open with a superficial wound. He has a wrap/surgical shoe injury dorsally on the left foot he is asking about a total contact cast and I think what I know about this area suggests that the lateral part of his foot is  a weightbearing surface and I think we could certainly justify this. 9/27; patient is finishing his antibiotics. His surgical wound is actually closed the area over the base of the left fifth metatarsal of is also closed but still with some eschar. 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 As such he actually walks on the outside of his foot. He has been in touch with [facility] Liliane Channel is their orthotic specialist READMISSION 08/20/2020 This is a 55 year old man who is a type II diabetic. He also has longstanding peripheral neuropathy largely  secondary to Charcot-Marie-T ooth disease. He has been in this clinic before with wounds over his left fifth and left fourth met head. He has a history of osteomyelitis in the left fifth met head requiring an excision of the fifth MTP.. The patient tells [place] that he has an AFO brace now to correct the ankle deformity. Sometime in February he developed a callus over the fourth metatarsal head ultimately Dr. Earleen Newport open this area to expose the wound. He used Silvadene cream for a while most recently Promogran. He has had antibiotics as well including doxycycline and apparently currently Augmentin although I am not certain there is been any cultures. He states the area has not been x-rayed. 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 The met head here is subluxed. His x-ray that we ordered last week was negative for osteomyelitis. Clinically there does not appear to be infection here. No erythema no drainage the wound does not probe to bone 4/4; put a total contact cast on him last week primary dressing of silver collagen he arrives in clinic today with a wound measuring larger and as opposed to last week a direct probe to bone. He is definitely going to need an MRI 4/11; miraculously the divot the probe to bone his filled in this week. Unfortunately his MRI is not till next Monday. We have been using silver collagen under a total contact cast. He has completed his antibiotics 4/18; no palpable bone. The distal half of this wound is has depth but there is no exposed bone. More proximal is at the level of the surrounding skin. His MRI is being delayed because of insurance issues was supposed to be today then Wednesday and now we are not exactly sure what the date is or what the issue is. In general things look  somewhat better. We have been using silver collagen under the total contact cast 4/27; patient presents for removal and placement of TCC. He has tolerated the cast well in the past week. He has no complaints today. This MRI is still pending scheduling. We will not use a silver dressing so in case it does get scheduled he can get this done without issue. 5/2; patient's wound is smaller but with undermining. We finally have approval for his MRI which has been going on for a month or more now. 5/9 the wound is smaller but with above 0.4 cm of depth we still do not have an MRI. No evidence of continued infection. He is tolerating the total contact cast well. 5/16; I think his wound is gradually improving. Medial part of it is deeper than the rest however this does not go  to bone. No evidence of surrounding infection. He is tolerating the total contact cast well. 5/23; Patient presents for 1 week follow up. He has been tolerating the cast well. He has no issues or complaints today. He denies signs of infection. Patient History Information obtained from Patient. Family History Cancer - Mother, Diabetes - Maternal Grandparents,Father, Heart Disease - Father, No family history of Hereditary Spherocytosis, Hypertension, Kidney Disease, Lung Disease, Seizures, Stroke, Thyroid Problems, Tuberculosis. Social History Former smoker - quit 30 years ago, Marital Status - Divorced, Alcohol Use - Rarely, Drug Use - No History, Caffeine Use - Daily - coffee. Medical History Eyes Patient has history of Cataracts - right eye removed Denies history of Glaucoma, Optic Neuritis Ear/Nose/Mouth/Throat Denies history of Chronic sinus problems/congestion, Middle ear problems Hematologic/Lymphatic Denies history of Anemia, Hemophilia, Human Immunodeficiency Virus, Lymphedema, Sickle Cell Disease Respiratory Denies history of Aspiration, Asthma, Chronic Obstructive Pulmonary Disease (COPD), Pneumothorax, Sleep Apnea,  Tuberculosis Cardiovascular Patient has history of Hypertension Denies history of Angina, Arrhythmia, Congestive Heart Failure, Coronary Artery Disease, Deep Vein Thrombosis, Hypotension, Myocardial Infarction, Peripheral Arterial Disease, Peripheral Venous Disease, Phlebitis, Vasculitis Gastrointestinal Denies history of Cirrhosis , Colitis, Crohnoos, Hepatitis A, Hepatitis B, Hepatitis C Endocrine Patient has history of Type II Diabetes Denies history of Type I Diabetes Genitourinary Denies history of End Stage Renal Disease Immunological Denies history of Lupus Erythematosus, Raynaudoos, Scleroderma Integumentary (Skin) Denies history of History of Burn Musculoskeletal Patient has history of Osteomyelitis - left foot Denies history of Gout, Rheumatoid Arthritis, Osteoarthritis Neurologic Patient has history of Neuropathy Denies history of Dementia, Quadriplegia, Paraplegia, Seizure Disorder Oncologic Denies history of Received Chemotherapy, Received Radiation Psychiatric Denies history of Anorexia/bulimia, Confinement Anxiety Hospitalization/Surgery History - IandD with bone biopsy. - rhinoplasty. - bone spur removed right elbow. - cervical C4-C5 sx. - right shoulder surgery. - left hand surgery. Medical A Surgical History Notes nd Eyes detach retina left eye in 2005 resulting in wearing a contact. 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 Vitals Time Taken: 10:33 AM, Height: 76 in, Weight: 260 lbs, BMI: 31.6, Temperature: 99.3 F, Pulse: 73 bpm, Respiratory Rate: 17 breaths/min, Blood Pressure: 123/84 mmHg, Capillary Blood Glucose: 117 mg/dl. Psychiatric pleasant and cooperative. General Notes: Left Foot: open wound to the 4th met head. No signs of  infection. Callus and non viable tissue present. Post debridement granulation tissue present. Integumentary (Hair, Skin) Wound #6 status is Open. Original cause of wound was Gradually Appeared. The date acquired was: 07/03/2020. The wound has been in treatment 9 [name]. The wound is located on the Left Metatarsal head fourth. The wound measures 0.2cm length x 0.2cm width x 0.2cm depth; 0.031cm^2 area and 0.006cm^3 volume. There is Fat Layer (Subcutaneous Tissue) exposed. There is no tunneling or undermining noted. There is a small amount of serosanguineous drainage noted. The wound margin is well defined and not attached to the wound base. There is large (67-100%) pink granulation within the wound bed. There is no necrotic tissue within the wound bed. Assessment Active Problems ICD-10 Type 2 diabetes mellitus with foot ulcer Non-pressure chronic ulcer of other part of left foot with other specified severity Patients wound is stable. Post debridement granulation tissue present. No signs of infection. Will replace contact cast today. Follow up in one week. Procedures Wound #6 Pre-procedure diagnosis of Wound #6 is a Diabetic Wound/Ulcer of the  Lower Extremity located on the Left Metatarsal head fourth .Severity of Tissue Pre Debridement is: Fat layer exposed. There was a Excisional Skin/Subcutaneous Tissue Debridement with a total area of 0.16 sq cm performed by Erik Shan, DO. With the following instrument(s): Curette to remove Viable and Non-Viable tissue/material. Material removed includes Callus and Subcutaneous Tissue and. No specimens were taken. A time out was conducted at 11:12, prior to the start of the procedure. A Minimum amount of bleeding was controlled with Pressure. The procedure was tolerated well with a pain level of 0 throughout and a pain level of 0 following the procedure. Post Debridement Measurements: 0.2cm length x 0.2cm width x 0.2cm depth; 0.006cm^3 volume. Character  of Wound/Ulcer Post Debridement is improved. Severity of Tissue Post Debridement is: Fat layer exposed. Post procedure Diagnosis Wound #6: Same as Pre-Procedure Pre-procedure diagnosis of Wound #6 is a Diabetic Wound/Ulcer of the Lower Extremity located on the Left Metatarsal head fourth . 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 Post procedure Diagnosis Wound #6: Same as Pre-Procedure Plan Follow-up Appointments: Return Appointment in 1 week. - with Dr. Dellia Nims - ***60 minutes for cast*** Bathing/ Shower/ Hygiene: May shower with protection but do not get wound dressing(s) wet. Off-Loading: T Contact Cast to Left Lower Extremity otal Additional Orders / Instructions: Other: - Call to schedule appt for MRI WOUND #6: - Metatarsal head fourth Wound Laterality: Left Cleanser: Wound Cleanser 1 x Per Week/7 Days Discharge Instructions: Cleanse the wound with wound cleanser prior to applying a clean dressing using gauze sponges, not tissue or cotton balls. Prim Dressing: Maxorb Extra Calcium Alginate 2x2 in 1 x Per Week/7 Days ary Discharge Instructions: Apply calcium alginate to wound bed as instructed Secondary Dressing: Woven Gauze Sponges 2x2 in (Generic) 1 x Per Week/7 Days Discharge Instructions: Apply over primary dressing as directed. Secondary Dressing: Optifoam Non-Adhesive Dressing, 4x4 in 1 x Per Week/7 Days Discharge Instructions: Apply over primary dressing cut to make foam donut Secured With: 1M Medipore H Soft Cloth Surgical T 4 x 2 (in/yd) 1 x Per Week/7 Days ape Discharge Instructions: Secure dressing with tape as directed. 1. in office sharp debridement 2. calcium alginate under the TCC 3. Follow up in 1 week Electronic Signature(s) Signed: 10/29/2020 4:06:22 PM By: Erik Shan DO Entered By: [name] on 10/29/2020 16:05:16 -------------------------------------------------------------------------------- HxROS Details Patient  Name: Date of Service: [facility], [place] 08/19/2332 10:00 A M Medical Record Number: [id number] Patient Account Number: [id number] Date of Birth/Sex: Treating RN: [birth year] (55 y.o. [name] Primary Care Provider: Wallene [name] Other Clinician: Referring Provider: Treating Provider/Extender: [name] in Treatment: 9 Information Obtained From Patient Eyes Medical History: Positive for: Cataracts - right eye removed Negative for: Glaucoma; Optic Neuritis Past Medical History Notes: detach retina left eye in 2005 resulting in wearing a contact. Ear/Nose/Mouth/Throat Medical History: Negative for: Chronic sinus problems/congestion; Middle ear problems Hematologic/Lymphatic Medical History: Negative for: Anemia; Hemophilia; Human Immunodeficiency Virus; Lymphedema; Sickle Cell Disease Respiratory Medical History: Negative for: Aspiration; Asthma; Chronic Obstructive Pulmonary Disease (COPD); Pneumothorax; Sleep Apnea; Tuberculosis Cardiovascular Medical History: Positive for: Hypertension Negative for: Angina; Arrhythmia; Congestive Heart Failure; Coronary Artery Disease; Deep Vein Thrombosis; Hypotension; Myocardial Infarction; Peripheral Arterial Disease; Peripheral Venous Disease; Phlebitis; Vasculitis Past Medical History Notes: hyperlipidemia Gastrointestinal Medical History: Negative for: Cirrhosis ; Colitis; Crohns; Hepatitis A; Hepatitis B; Hepatitis C Past Medical History Notes: hx pancreatitis, hx stomach ulcer, esophagitis Endocrine Medical History: Positive for:  Type II Diabetes Negative for: Type I Diabetes Time with diabetes: 9 years Treated with: Insulin Blood sugar tested every day: Yes Tested : 4-5 times per day Genitourinary Medical History: Negative for: End Stage Renal Disease Immunological Medical History: Negative for: Lupus Erythematosus; Raynauds; Scleroderma Integumentary (Skin) Medical  History: Negative for: History of Burn Musculoskeletal Medical History: Positive for: Osteomyelitis - left foot Negative for: Gout; Rheumatoid Arthritis; Osteoarthritis Neurologic Medical History: Positive for: Neuropathy Negative for: Dementia; Quadriplegia; Paraplegia; Seizure Disorder Past Medical History Notes: 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 Oncologic Medical History: Negative for: Received Chemotherapy; Received Radiation Psychiatric Medical History: Negative for: Anorexia/bulimia; Confinement Anxiety Past Medical History Notes: ADHD HBO Extended History Items Eyes: Cataracts Immunizations Pneumococcal Vaccine: Received Pneumococcal Vaccination: No Implantable Devices None Hospitalization / Surgery History Type of Hospitalization/Surgery IandD with bone biopsy rhinoplasty bone spur removed right elbow cervical C4-C5 sx right shoulder surgery left hand surgery Family and Social History Cancer: Yes - Mother; Diabetes: Yes - Maternal Grandparents,Father; Heart Disease: Yes - Father; Hereditary Spherocytosis: No; Hypertension: No; Kidney Disease: No; Lung Disease: No; Seizures: No; Stroke: No; Thyroid Problems: No; Tuberculosis: No; Former smoker - quit 30 years ago; Marital Status - Divorced; Alcohol Use: Rarely; Drug Use: No History; Caffeine Use: Daily - coffee; Financial Concerns: No; Food, Clothing or Shelter Needs: No; Support System Lacking: No; Transportation Concerns: No Electronic Signature(s) Signed: 10/29/2020 4:06:22 PM By: Erik Shan DO Signed: 11/19/2020 5:36:56 PM By: Erik Hurst RN, BSN Entered By: [name] on 10/29/2020 16:02:23 -------------------------------------------------------------------------------- Total Contact Cast Details Patient Name: Date of Service: [facility], [place] 11/26/9483 10:00 A M Medical Record Number: [id number] Patient Account Number: [id number] Date of Birth/Sex: Treating RN: [birth year] (55  y.o. [name] Primary Care Provider: Wallene [name] Other Clinician: Referring Provider: Treating Provider/Extender: [name] in Treatment: 9 T Contact Cast Applied for Wound Assessment: otal Wound #6 Left Metatarsal head fourth Performed By: Physician Erik Shan, DO Post Procedure Diagnosis Same as Pre-procedure Electronic Signature(s) Signed: 10/22/2020 5:27:53 PM By: Erik Hurst RN, BSN Signed: 10/29/2020 4:06:22 PM By: Erik Shan DO Entered By: [name] on 10/22/2020 11:13:17 -------------------------------------------------------------------------------- SuperBill Details Patient Name: Date of Service: [facility], [place] 9/38/[birth year]29 Medical Record Number: [id number] Patient Account Number: [id number] Date of Birth/Sex: Treating RN: 12/28/1965 (55 y.o. [name] Primary Care Provider: Wallene [name] Other Clinician: Referring Provider: Treating Provider/Extender: [name] in Treatment: 9 Diagnosis Coding ICD-10 Codes Code Description E11.621 Type 2 diabetes mellitus with foot ulcer L97.528 Non-pressure chronic ulcer of other part of left foot with other specified severity Facility Procedures CPT4 Code: 93810175 Description: 10258 - DEB SUBQ TISSUE 20 SQ CM/< ICD-10 Diagnosis Description E11.621 Type 2 diabetes mellitus with foot ulcer L97.528 Non-pressure chronic ulcer of other part of left foot with other specified seve Modifier: rity Quantity: 1 Physician Procedures : CPT4 Code Description Modifier [id number] - WC PHYS LEVEL 3 - EST PT ICD-10 Diagnosis Description E11.621 Type 2 diabetes mellitus with foot ulcer L97.528 Non-pressure chronic ulcer of other part of left foot with other specified severity Quantity: 1 : [id number] - WC PHYS SUBQ TISS 20 SQ CM ICD-10 Diagnosis Description E11.621 Type 2 diabetes mellitus with foot ulcer L97.528 Non-pressure chronic ulcer of  other part of left foot with other specified severity Quantity: 1 Electronic Signature(s) Signed: 10/29/2020 4:06:22 PM By: Erik Shan DO Entered By: [name] on 10/29/2020 16:05:37

## 2020-11-20 NOTE — Progress Notes (Signed)
Subjective: 55 year old male presents the office today for concerns of foot wound reopening from his left foot.  He was discharged from wound care center.  He been wearing the inserts as well as bracing the skin had opened up some.  Denies any drainage or pus or any swelling or redness.  He did try keep the area bandaged and clean.  Denies any systemic complaints such as fevers, chills, nausea, vomiting. No acute changes since last appointment, and no other complaints at this time.   Last A1c was 8.8 on August 07, 2020  Objective: AAO x3, NAD DP/PT pulses palpable bilaterally, CRT less than 3 seconds On the fourth metatarsal head plantarly there is some dry skin present with a central skin fissure with superficial granulation tissue present.  No probing, undermining or tunneling.  There is no surrounding erythema, ascending cellulitis.  There is no fluctuation crepitation.  No malodor.  No other open lesions identified. No pain with calf compression, swelling, warmth, erythema  Assessment: Reoccurrence ulcer left foot  Plan: -All treatment options discussed with the patient including all alternatives, risks, complications.  -Recommended to keep the area clean.  He can wash with soap and water daily and dry thoroughly.  Apply a small mount of antibiotic ointment as the skin does appear to be dry around the edges.  I dispensed various offloading pads he can also try.  Recommend him to go back to [facility] in order to further modify the orthotics for brace for further offloading. -Monitor for any clinical signs or symptoms of infection and directed to call the office immediately should any occur or go to the ER. -Patient encouraged to call the office with any questions, concerns, change in symptoms.   [name] DPM

## 2020-12-06 ENCOUNTER — Other Ambulatory Visit: Payer: Self-pay

## 2020-12-06 ENCOUNTER — Ambulatory Visit (INDEPENDENT_AMBULATORY_CARE_PROVIDER_SITE_OTHER): Payer: BLUE CROSS/BLUE SHIELD | Admitting: Podiatry

## 2020-12-06 DIAGNOSIS — E1149 Type 2 diabetes mellitus with other diabetic neurological complication: Secondary | ICD-10-CM | POA: Diagnosis not present

## 2020-12-06 DIAGNOSIS — L03116 Cellulitis of left lower limb: Secondary | ICD-10-CM | POA: Diagnosis not present

## 2020-12-06 DIAGNOSIS — L97522 Non-pressure chronic ulcer of other part of left foot with fat layer exposed: Secondary | ICD-10-CM

## 2020-12-06 MED ORDER — CEPHALEXIN 500 MG PO CAPS: 500.0000 mg | ORAL_CAPSULE | Freq: Three times a day (TID) | ORAL | 0 refills | Status: DC

## 2020-12-10 ENCOUNTER — Ambulatory Visit: Payer: BLUE CROSS/BLUE SHIELD | Admitting: Podiatry

## 2020-12-11 NOTE — Progress Notes (Signed)
Subjective: 55 year old male presents the office today for concerns of foot wound on his left foot.  He does have these been on his feet a lot which may evaluate symptoms and he went to wearing the same and not wear his boot was wearing regular shoe.  He thinks the wound is gotten somewhat deeper.  Denies any drainage or pus.  He does get intermittent swelling to the foot and ankle.  No redness or warmth he reports that.  Denies any additional chills, nausea, vomiting.  Denies any calf pain, chest pain, shortness of breath.  Last A1c was 8.8 on August 07, 2020  Objective: AAO x3, NAD DP/PT pulses palpable bilaterally, CRT less than 3 seconds On the plantar aspect of the left foot submetatarsal 4 is a granular wound with hyperkeratotic periwound.  Upon debridement of the wound there is no probing to bone, although close.  There is no undermining or tunneling.  Small mount of macerated tissue in the periwound.  There is no drainage or pus identified today.  No fluctuance or crepitation.  No malodor.  Just distal to this area a small mount of localized edema and erythema.   No pain with calf compression, swelling, warmth, erythema  Assessment: Reoccurrence ulcer left foot, localized erythema  Plan: -All treatment options discussed with the patient including all alternatives, risks, complications.  -Sharply debrided the wound to a lesser degree fully covered and the bleeding, healthy, granular tissue to help her wound healing.  Minimal blood loss.  Hemostasis achieved with manual compression.  Tolerated procedure well.  Silvadene dressing changes daily. -Prescription Keflex.  Encouraged elevation and offloading.  Limit activity.  Recommend to follow-up with the wound care center as well as any contact them for an appointment. -Discussed trying to offload the brace to help further offload this once the wound is healed. -Glucose control -Monitor for any clinical signs or symptoms of infection and  directed to call the office immediately should any occur or go to the ER.  Vivi Barrack DPM

## 2020-12-17 ENCOUNTER — Encounter: Payer: Self-pay | Admitting: Podiatry

## 2020-12-17 ENCOUNTER — Ambulatory Visit (INDEPENDENT_AMBULATORY_CARE_PROVIDER_SITE_OTHER): Payer: BLUE CROSS/BLUE SHIELD

## 2020-12-17 ENCOUNTER — Other Ambulatory Visit: Payer: Self-pay

## 2020-12-17 ENCOUNTER — Ambulatory Visit (INDEPENDENT_AMBULATORY_CARE_PROVIDER_SITE_OTHER): Payer: BLUE CROSS/BLUE SHIELD | Admitting: Podiatry

## 2020-12-17 DIAGNOSIS — L97522 Non-pressure chronic ulcer of other part of left foot with fat layer exposed: Secondary | ICD-10-CM | POA: Diagnosis not present

## 2020-12-17 DIAGNOSIS — M869 Osteomyelitis, unspecified: Secondary | ICD-10-CM

## 2020-12-20 ENCOUNTER — Ambulatory Visit (INDEPENDENT_AMBULATORY_CARE_PROVIDER_SITE_OTHER): Payer: BLUE CROSS/BLUE SHIELD | Admitting: Podiatry

## 2020-12-20 ENCOUNTER — Other Ambulatory Visit: Payer: Self-pay

## 2020-12-20 DIAGNOSIS — M869 Osteomyelitis, unspecified: Secondary | ICD-10-CM | POA: Diagnosis not present

## 2020-12-20 DIAGNOSIS — L97522 Non-pressure chronic ulcer of other part of left foot with fat layer exposed: Secondary | ICD-10-CM

## 2020-12-24 ENCOUNTER — Other Ambulatory Visit: Payer: Self-pay

## 2020-12-24 ENCOUNTER — Ambulatory Visit: Payer: BLUE CROSS/BLUE SHIELD

## 2020-12-24 ENCOUNTER — Ambulatory Visit (INDEPENDENT_AMBULATORY_CARE_PROVIDER_SITE_OTHER): Payer: BLUE CROSS/BLUE SHIELD | Admitting: Podiatry

## 2020-12-24 DIAGNOSIS — M869 Osteomyelitis, unspecified: Secondary | ICD-10-CM | POA: Diagnosis not present

## 2020-12-24 DIAGNOSIS — L97522 Non-pressure chronic ulcer of other part of left foot with fat layer exposed: Secondary | ICD-10-CM | POA: Diagnosis not present

## 2020-12-24 DIAGNOSIS — E1149 Type 2 diabetes mellitus with other diabetic neurological complication: Secondary | ICD-10-CM | POA: Diagnosis not present

## 2020-12-24 MED ORDER — SULFAMETHOXAZOLE-TRIMETHOPRIM 800-160 MG PO TABS: 1.0000 | ORAL_TABLET | Freq: Two times a day (BID) | ORAL | 0 refills | Status: DC

## 2020-12-24 NOTE — Progress Notes (Signed)
Subjective: 55 year old male presents the office today for follow evaluation of wound on the left foot.  He thinks he is doing better.  He has been using a scooter.  The swelling and the redness has improved.  He has not seen any pus on some bloody drainage at times.  Denies any fevers, chills, nausea, vomiting.  Last A1c was 8.8 on August 07, 2020  Objective: AAO x3, NAD DP/PT pulses palpable bilaterally, CRT less than 3 seconds On the plantar aspect of the left foot submetatarsal 4 is a granular wound with hyperkeratotic periwound.  Overall the wound is smaller however does probe it does touch the fourth metatarsal head today.  There is no purulence.  Only bloody drainage expressed.  There is mild surrounding edema but erythema has resolved.  There is no fluctuation crepitation there is no malodor. No pain with calf compression, swelling, warmth, erythema  Assessment: Reoccurrence ulcer left foot, osteomyelitis  Plan: -All treatment options discussed with the patient including all alternatives, risks, complications.  -X-rays obtained reviewed.  Degenerative changes present left fourth MPJ and likely cortical changes suggestive of osteomyelitis.  MRI has been ordered -Today he is placed into a total contact cast making sure to pad all bony prominences.  I cleaned the wound applied offloading pad prior to this.  Discussed the risks of the calcium if there is any rubbing or irritation but no immediately.  Courage elevation still using the knee scooter.  Continue antibiotics.  I will see him back on Thursday for cast change.  Vivi Barrack DPM

## 2020-12-25 ENCOUNTER — Telehealth: Payer: Self-pay | Admitting: *Deleted

## 2020-12-25 ENCOUNTER — Telehealth: Payer: Self-pay | Admitting: Podiatry

## 2020-12-25 NOTE — Progress Notes (Signed)
Subjective: 55 year old male presents the office today for follow evaluation of wound on the left foot.  He presents today to change a total contact cast.  States he is doing well and the cast was not been rubbing.  He has no concerns today.  Denies any fevers or chills.  MRI pending.  Last A1c was 8.8 on August 07, 2020  Objective: AAO x3, NAD DP/PT pulses palpable bilaterally, CRT less than 3 seconds On the plantar aspect of the left foot submetatarsal 4 is a granular wound with hyperkeratotic periwound.  Tender medically stable but more superficial does not probe to bone today.  Small amount of bloody drainage.  There is macerated tissue along periwound.  There is no fluctuance or crepitation.  There is no erythema or warmth.  No ascending cellulitis. No pain with calf compression, swelling, warmth, erythema  Assessment: Reoccurrence ulcer left foot, osteomyelitis  Plan: -All treatment options discussed with the patient including all alternatives, risks, complications.  -MRI pending.  Wound was cleaned today Betadine was applied On the macerated tissue.  New total contact cast was applied make sure to pad all bony prominences.  I will see him back on Monday if he is to go to town from work, to remove the cast and likely placed into a cam boot today does not go out of town with the cast on. -Monitor for any clinical signs or symptoms of infection and directed to call the office immediately should any occur or go to the ER.  No follow-ups on file.  Vivi Barrack DPM

## 2020-12-25 NOTE — Telephone Encounter (Signed)
Called and spoke with [facility] today and got the patient scheduled on the 30th of July for a 5:50 pm appointment and arrival time is 5:20 pm and I called and left a message for the patient today. Misty Stanley

## 2020-12-25 NOTE — Telephone Encounter (Signed)
-----   Message from Vivi Barrack, DPM sent at 12/24/2020  7:50 AM EDT ----- MRI was ordered last week.  Can you please follow-up on this?  Thank you.

## 2020-12-25 NOTE — Telephone Encounter (Signed)
Patients insurance is NON-PARTICIPATING ([employer] [employer]). Will need to be referred to imaging facility in network.

## 2020-12-26 NOTE — Telephone Encounter (Signed)
Called [facility] at [phone] and spoke with the representative and they stated to send over the MRI order and last chart note, demographics and insurance card [phone] fax number)and that they would take care of getting the patient scheduled and I was told that it would be about a few weeks before patient could be seen due to availability and I called and left message with the patient as well. Misty Stanley

## 2020-12-27 NOTE — Telephone Encounter (Signed)
Pam, calling from [facility] to inform Dr. Ardelle Anton that they can not do imaging services for patient due to the order being written incorrectly. Pam stated that the address on the order is not correct and it was written out for [facility] and not [facility]. This will need to be approved and corrected before patient can receive services.

## 2020-12-27 NOTE — Progress Notes (Signed)
Subjective: 55 year old male presents the office today for follow evaluation of wound on the left foot.  He presents to the total contact cast removed as he has to go to town for work to [place] and I do not feel comfortable with him going out of 10 with a cast on.  He denies any fevers or chills.  MRI still pending.  Last A1c was 8.8 on August 07, 2020  Objective: AAO x3, NAD DP/PT pulses palpable bilaterally, CRT less than 3 seconds On the plantar aspect of the left foot submetatarsal 4 is a granular wound with hyperkeratotic periwound.  Some macerated tissue present.  Minimal amount of purulence identified but more bloody drainage.  The purulence appeared to be superficial.  However the wound is deeper today and does probe down to the bone.  There is faint erythema along the wound with there is no fluctuation or crepitation.  There is no swelling or erythema to the dorsal aspect the foot.  No pain. No pain with calf compression, swelling, warmth, erythema  Assessment: Reoccurrence ulcer left foot, concern for osteomyelitis  Plan: -All treatment options discussed with the patient including all alternatives, risks, complications.  -Cast was removed today.  Wound culture was obtained.  I did clean the wound as well as debrided this.  I irrigated the wound and a small amount of iodoform packing was applied.  Recommended him to do this every day as well.  Continue cam boot, nonweightbearing.  Awaiting MRI.  If there is any worsening signs or symptoms of infection he is to report directly to the emergency department.  Also recommended wound care follow-up. He is known to the wound care center and advised him to call for an appointment.  -Bactrim ordered  *Obtain blood work next appointment- CBC, CMP, ESR, CRP  No follow-ups on file.  Trula Slade DPM

## 2020-12-27 NOTE — Telephone Encounter (Signed)
I am taken care of this. [name]

## 2020-12-27 NOTE — Addendum Note (Signed)
Addended by: Hadley Pen R on: 12/27/2020 12:04 PM   Modules accepted: Orders

## 2020-12-28 LAB — WOUND CULTURE
MICRO NUMBER:: 12164082
SPECIMEN QUALITY:: ADEQUATE

## 2020-12-28 NOTE — Telephone Encounter (Signed)
Hey Dr Legrand Rams called and spoke with [facility] and it has to have a peer to peer and the phone number is [phone]. Misty Stanley

## 2020-12-29 ENCOUNTER — Other Ambulatory Visit: Payer: BLUE CROSS/BLUE SHIELD

## 2021-01-01 NOTE — Telephone Encounter (Signed)
Called and spoke with Pam at [facility] [phone]) and faxed over the referral and the authorization number is [id number] and is valid thru 01-26-2021 and fox is [phone] and they stated that it would not be this week as far as getting patient in the facility but would be calling the patient soon. Misty Stanley

## 2021-01-07 ENCOUNTER — Ambulatory Visit (INDEPENDENT_AMBULATORY_CARE_PROVIDER_SITE_OTHER): Payer: BLUE CROSS/BLUE SHIELD | Admitting: Podiatry

## 2021-01-07 ENCOUNTER — Other Ambulatory Visit: Payer: Self-pay

## 2021-01-07 DIAGNOSIS — L97522 Non-pressure chronic ulcer of other part of left foot with fat layer exposed: Secondary | ICD-10-CM

## 2021-01-07 NOTE — Progress Notes (Signed)
Subjective: 55 year old male presents the office today for follow evaluation of wound on the left foot.  He has an MRI scheduled for this week and he had a movement as he is out of town again and scheduled for next Monday.  He recent got back from vacation he states he was on his foot when he should have been given the number of steps that he was not aware that he would have to climb.  He is still concerned about the tablet he does check it himself.  Last A1c was 8.8 on August 07, 2020  Objective: AAO x3, NAD DP/PT pulses palpable bilaterally, CRT less than 3 seconds On the plantar aspect of the left foot submetatarsal 4 is a granular wound with mild hyperkeratotic periwound.  There is probing to bone noted today.  There is no surrounding erythema, ascending cellulitis.  No fluctuance crepitation there is no malodor. Cavus foot type present. No pain with calf compression, swelling, warmth, erythema  Assessment: Reoccurrence ulcer left foot, concern for osteomyelitis  Plan: -All treatment options discussed with the patient including all alternatives, risks, complications.  -Concern for posterior still with probing to bone.  Blood work ordered.  Awaiting MRI.  He is having nuclear stress given his work schedule.  Presents the office which is possible and elevation.  He has a knee scooter.  Today well-padded below-knee total contact cast was applied making sure to pad all bony prominences.  Precautions were advised on any irritation or rubbing to let me know immediately or go to the emergency room for cast removal.  Also recommend him to follow-up with his care center as well but we can wait for the MRI at this point.  He will follow-up on Monday for cast change.  Vivi Barrack DPM

## 2021-01-09 ENCOUNTER — Other Ambulatory Visit: Payer: Self-pay | Admitting: Podiatry

## 2021-01-09 LAB — COMPREHENSIVE METABOLIC PANEL
AG Ratio: 1.4 (calc) (ref 1.0–2.5)
ALT: 21 U/L (ref 9–46)
AST: 17 U/L (ref 10–35)
Albumin: 4.4 g/dL (ref 3.6–5.1)
Alkaline phosphatase (APISO): 97 U/L (ref 35–144)
BUN/Creatinine Ratio: 15 (calc) (ref 6–22)
BUN: 24 mg/dL (ref 7–25)
CO2: 26 mmol/L (ref 20–32)
Calcium: 9.7 mg/dL (ref 8.6–10.3)
Chloride: 95 mmol/L — ABNORMAL LOW (ref 98–110)
Creat: 1.6 mg/dL — ABNORMAL HIGH (ref 0.70–1.30)
Globulin: 3.2 g/dL (calc) (ref 1.9–3.7)
Glucose, Bld: 378 mg/dL — ABNORMAL HIGH (ref 65–99)
Potassium: 4.6 mmol/L (ref 3.5–5.3)
Sodium: 131 mmol/L — ABNORMAL LOW (ref 135–146)
Total Bilirubin: 0.9 mg/dL (ref 0.2–1.2)
Total Protein: 7.6 g/dL (ref 6.1–8.1)

## 2021-01-09 LAB — CBC WITH DIFFERENTIAL/PLATELET
Absolute Monocytes: 435 cells/uL (ref 200–950)
Basophils Absolute: 58 cells/uL (ref 0–200)
Basophils Relative: 0.9 %
Eosinophils Absolute: 256 cells/uL (ref 15–500)
Eosinophils Relative: 4 %
HCT: 43.2 % (ref 38.5–50.0)
Hemoglobin: 13.9 g/dL (ref 13.2–17.1)
Lymphs Abs: 2560 cells/uL (ref 850–3900)
MCH: 28.9 pg (ref 27.0–33.0)
MCHC: 32.2 g/dL (ref 32.0–36.0)
MCV: 89.8 fL (ref 80.0–100.0)
MPV: 9.7 fL (ref 7.5–12.5)
Monocytes Relative: 6.8 %
Neutro Abs: 3091 cells/uL (ref 1500–7800)
Neutrophils Relative %: 48.3 %
Platelets: 237 10*3/uL (ref 140–400)
RBC: 4.81 10*6/uL (ref 4.20–5.80)
RDW: 13.8 % (ref 11.0–15.0)
Total Lymphocyte: 40 %
WBC: 6.4 10*3/uL (ref 3.8–10.8)

## 2021-01-09 LAB — C-REACTIVE PROTEIN: CRP: 4.9 mg/L (ref <8.0)

## 2021-01-09 LAB — SEDIMENTATION RATE: Sed Rate: 6 mm/h (ref 0–20)

## 2021-01-09 MED ORDER — DOXYCYCLINE HYCLATE 100 MG PO TABS: 100.0000 mg | ORAL_TABLET | Freq: Two times a day (BID) | ORAL | 0 refills | Status: DC

## 2021-01-14 ENCOUNTER — Ambulatory Visit (INDEPENDENT_AMBULATORY_CARE_PROVIDER_SITE_OTHER): Payer: BLUE CROSS/BLUE SHIELD | Admitting: Podiatry

## 2021-01-16 ENCOUNTER — Ambulatory Visit (INDEPENDENT_AMBULATORY_CARE_PROVIDER_SITE_OTHER): Payer: BLUE CROSS/BLUE SHIELD

## 2021-01-16 ENCOUNTER — Other Ambulatory Visit: Payer: Self-pay

## 2021-01-16 DIAGNOSIS — L97522 Non-pressure chronic ulcer of other part of left foot with fat layer exposed: Secondary | ICD-10-CM

## 2021-01-16 NOTE — Progress Notes (Signed)
ThisPatient did not show for his appointment today.  He called at the end of the business day on Monday requesting to be seen on Wednesday.  I am out of the office for surgery on Wednesday.  He needs to get the MRI done.

## 2021-01-16 NOTE — Progress Notes (Signed)
Patient in office today to have total contact cast removed. Patient is scheduled to have MRI done tomorrow, 01/17/21 and needed the cast off. Cast removed without complication. Wound was covered with a band-aid at this time and wedged surgical shoe was dispensed. Patient advised to make an appointment at the front desk to have cast put back on after MRI is completed. Patient verbalized understanding. Also advised to continue using knee scooter.

## 2021-01-18 ENCOUNTER — Telehealth: Payer: Self-pay | Admitting: *Deleted

## 2021-01-18 ENCOUNTER — Telehealth: Payer: Self-pay | Admitting: Podiatry

## 2021-01-18 NOTE — Telephone Encounter (Signed)
Patient has an upcoming appointment on 01/21/21, but what should he do /treat in meantime his wound,blister on side of the foot(raw from previous cast),how to treat?Please advise.

## 2021-01-18 NOTE — Telephone Encounter (Signed)
Attempted to call patient to go over MRI results, shows osteomyelitis. No answer and left VM to call back.

## 2021-01-18 NOTE — Telephone Encounter (Signed)
Attempted to call patient back as he was returning my call. Went straight to [employer]. VM left to call back.

## 2021-01-21 ENCOUNTER — Ambulatory Visit (INDEPENDENT_AMBULATORY_CARE_PROVIDER_SITE_OTHER): Payer: BLUE CROSS/BLUE SHIELD | Admitting: Podiatry

## 2021-01-21 ENCOUNTER — Telehealth: Payer: Self-pay | Admitting: *Deleted

## 2021-01-21 ENCOUNTER — Encounter: Payer: Self-pay | Admitting: Podiatry

## 2021-01-21 ENCOUNTER — Other Ambulatory Visit: Payer: Self-pay

## 2021-01-21 ENCOUNTER — Ambulatory Visit (INDEPENDENT_AMBULATORY_CARE_PROVIDER_SITE_OTHER): Payer: BLUE CROSS/BLUE SHIELD

## 2021-01-21 VITALS — Temp 97.4°F

## 2021-01-21 DIAGNOSIS — M869 Osteomyelitis, unspecified: Secondary | ICD-10-CM

## 2021-01-21 DIAGNOSIS — L97522 Non-pressure chronic ulcer of other part of left foot with fat layer exposed: Secondary | ICD-10-CM

## 2021-01-21 NOTE — Telephone Encounter (Signed)
Returned call to patient to give instructions per physician, no answer, left vmessage.

## 2021-01-23 ENCOUNTER — Encounter: Payer: Self-pay | Admitting: Podiatry

## 2021-01-23 ENCOUNTER — Other Ambulatory Visit: Payer: Self-pay | Admitting: Podiatry

## 2021-01-23 DIAGNOSIS — M86672 Other chronic osteomyelitis, left ankle and foot: Secondary | ICD-10-CM | POA: Diagnosis not present

## 2021-01-23 MED ORDER — ONDANSETRON HCL 4 MG PO TABS: 4.0000 mg | ORAL_TABLET | Freq: Three times a day (TID) | ORAL | 0 refills | Status: DC | PRN

## 2021-01-23 MED ORDER — DOXYCYCLINE HYCLATE 100 MG PO TABS: 100.0000 mg | ORAL_TABLET | Freq: Two times a day (BID) | ORAL | 0 refills | Status: DC

## 2021-01-23 MED ORDER — HYDROCODONE-ACETAMINOPHEN 5-325 MG PO TABS: 1.0000 | ORAL_TABLET | Freq: Four times a day (QID) | ORAL | 0 refills | Status: DC | PRN

## 2021-01-23 NOTE — Progress Notes (Signed)
Postop medications sent 

## 2021-01-25 ENCOUNTER — Telehealth: Payer: Self-pay | Admitting: *Deleted

## 2021-01-25 NOTE — Telephone Encounter (Signed)
Called and spoke with the patient yesterday and stated that I was calling to see how the patient was doing after surgery and patient stated that he was doing ok and was staying off of it and the boot was still on and there was no fever or chills and not any nausea and icing and elevating and I stated to call the office if any concerns or questions. Misty Stanley

## 2021-01-28 NOTE — Progress Notes (Signed)
Subjective: 55 year old male presents the office today for follow evaluation of wound on the left foot and discussed MRI results.  MRI shows osteomyelitis of the fourth metatarsal, digit.  The patient previously called me on my personal cell phone on Friday, August 19 and I reviewed the MRI with him.  We discussed limb salvage versus amputation at this point decided to proceed with amputation.  He also did not show up for his appointment after cast change and was on longer than I wanted I did cause a wound on the lateral aspect of the foot.  Recommended to continue antibiotics, doxycycline that he is on.  Currently denies any fevers or chills.  No nausea or vomiting.   Last A1c was 8.8 on August 07, 2020  Objective: AAO x3, NAD DP/PT pulses palpable bilaterally, CRT less than 3 seconds On the plantar aspect of the left foot submetatarsal 4 is a granular wound with mild hyperkeratotic periwound.  There is probing to bone noted today.  There is no surrounding erythema, ascending cellulitis.  To lateral aspect the foot is a superficial area of skin breakdown with mild surrounding erythema without any ascending cellulitis.  There is no probing, undermining or tunneling.  There is no fluctuance or crepitation.  No malodor. Cavus foot type present. No pain with calf compression, swelling, warmth, erythema  Assessment: Reoccurrence ulcer left foot, osteomyelitis; lateral foot ulcer  Plan: -All treatment options discussed with the patient including all alternatives, risks, complications.  -I again reviewed the MRI findings with him.  After discussion we decided to proceed with amputation.  Decided on partial fourth amputation he wished to proceed with this.  We will plan on doing this on Wednesday. -He had stopped antibiotics and I want him to go back on doxycycline for now.  Also standing and walking on his foot I would still recommend using the boot and offloading. -The incision placement as well as the  postoperative course was discussed with the patient. I discussed risks of the surgery which include, but not limited to, infection, bleeding, pain, swelling, need for further surgery, delayed or nonhealing, painful or ugly scar, numbness or sensation changes,  recurrence, transfer lesions, further deformity, DVT/PE, loss of toe/foot. Patient understands these risks and wishes to proceed with surgery. The surgical consent was reviewed with the patient all 3 pages were signed. No promises or guarantees were given to the outcome of the procedure. All questions were answered to the best of my ability. Before the surgery the patient was encouraged to call the office if there is any further questions. The surgery will be performed at the [facility] on an outpatient basis.  Vivi Barrack DPM  After this surgery discussed with his sister referral again to the [facility] wound care center.  I will refer him to go after I see him back for his postop visit.  Referral to be placed for this.

## 2021-01-29 ENCOUNTER — Other Ambulatory Visit: Payer: Self-pay

## 2021-01-29 ENCOUNTER — Ambulatory Visit (INDEPENDENT_AMBULATORY_CARE_PROVIDER_SITE_OTHER): Payer: BLUE CROSS/BLUE SHIELD

## 2021-01-29 ENCOUNTER — Encounter: Payer: Self-pay | Admitting: Podiatry

## 2021-01-29 ENCOUNTER — Telehealth: Payer: Self-pay | Admitting: *Deleted

## 2021-01-29 ENCOUNTER — Ambulatory Visit (INDEPENDENT_AMBULATORY_CARE_PROVIDER_SITE_OTHER): Payer: BLUE CROSS/BLUE SHIELD | Admitting: Podiatry

## 2021-01-29 VITALS — BP 117/67 | HR 74 | Temp 95.9°F | Resp 14

## 2021-01-29 DIAGNOSIS — M869 Osteomyelitis, unspecified: Secondary | ICD-10-CM

## 2021-01-29 DIAGNOSIS — L97522 Non-pressure chronic ulcer of other part of left foot with fat layer exposed: Secondary | ICD-10-CM

## 2021-01-29 NOTE — Telephone Encounter (Signed)
Sent over to [facility] at [phone] today. Misty Stanley

## 2021-01-29 NOTE — Telephone Encounter (Signed)
-----   Message from Vivi Barrack, DPM sent at 01/28/2021  4:02 PM EDT ----- I have put in a new referral to the [facility], Dr. Ave Filter. CAn you please fax? Thanks!

## 2021-02-05 ENCOUNTER — Emergency Department (HOSPITAL_COMMUNITY): Payer: BLUE CROSS/BLUE SHIELD

## 2021-02-05 ENCOUNTER — Emergency Department (HOSPITAL_COMMUNITY)
Admission: EM | Admit: 2021-02-05 | Discharge: 2021-02-05 | Disposition: A | Discharge status: Home / Self Care | Payer: BLUE CROSS/BLUE SHIELD | Attending: Emergency Medicine | Admitting: Emergency Medicine

## 2021-02-05 ENCOUNTER — Other Ambulatory Visit: Payer: Self-pay

## 2021-02-05 DIAGNOSIS — Z79899 Other long term (current) drug therapy: Secondary | ICD-10-CM | POA: Insufficient documentation

## 2021-02-05 DIAGNOSIS — E1140 Type 2 diabetes mellitus with diabetic neuropathy, unspecified: Secondary | ICD-10-CM | POA: Diagnosis not present

## 2021-02-05 DIAGNOSIS — Z7982 Long term (current) use of aspirin: Secondary | ICD-10-CM | POA: Diagnosis not present

## 2021-02-05 DIAGNOSIS — E1110 Type 2 diabetes mellitus with ketoacidosis without coma: Secondary | ICD-10-CM | POA: Diagnosis not present

## 2021-02-05 DIAGNOSIS — I10 Essential (primary) hypertension: Secondary | ICD-10-CM | POA: Diagnosis not present

## 2021-02-05 DIAGNOSIS — R197 Diarrhea, unspecified: Secondary | ICD-10-CM | POA: Insufficient documentation

## 2021-02-05 DIAGNOSIS — R112 Nausea with vomiting, unspecified: Secondary | ICD-10-CM | POA: Insufficient documentation

## 2021-02-05 DIAGNOSIS — K219 Gastro-esophageal reflux disease without esophagitis: Secondary | ICD-10-CM | POA: Diagnosis not present

## 2021-02-05 DIAGNOSIS — R109 Unspecified abdominal pain: Secondary | ICD-10-CM | POA: Diagnosis not present

## 2021-02-05 DIAGNOSIS — Z794 Long term (current) use of insulin: Secondary | ICD-10-CM | POA: Diagnosis not present

## 2021-02-05 DIAGNOSIS — Z87891 Personal history of nicotine dependence: Secondary | ICD-10-CM | POA: Insufficient documentation

## 2021-02-05 LAB — URINALYSIS, ROUTINE W REFLEX MICROSCOPIC
Bacteria, UA: NONE SEEN
Bilirubin Urine: NEGATIVE
Glucose, UA: >=500 mg/dL — AB
Ketones, ur: 80 mg/dL — AB
Leukocytes,Ua: NEGATIVE
Nitrite: NEGATIVE
Protein, ur: NEGATIVE mg/dL
Specific Gravity, Urine: 1.039 — ABNORMAL HIGH (ref 1.005–1.030)
pH: 6 (ref 5.0–8.0)

## 2021-02-05 LAB — CBC WITH DIFFERENTIAL/PLATELET
Abs Immature Granulocytes: 0.06 10*3/uL (ref 0.00–0.07)
Basophils Absolute: 0 10*3/uL (ref 0.0–0.1)
Basophils Relative: 0 %
Eosinophils Absolute: 0 10*3/uL (ref 0.0–0.5)
Eosinophils Relative: 0 %
HCT: 43.2 % (ref 39.0–52.0)
Hemoglobin: 14.8 g/dL (ref 13.0–17.0)
Immature Granulocytes: 1 %
Lymphocytes Relative: 11 %
Lymphs Abs: 1.3 10*3/uL (ref 0.7–4.0)
MCH: 29 pg (ref 26.0–34.0)
MCHC: 34.3 g/dL (ref 30.0–36.0)
MCV: 84.7 fL (ref 80.0–100.0)
Monocytes Absolute: 0.5 10*3/uL (ref 0.1–1.0)
Monocytes Relative: 5 %
Neutro Abs: 9.2 10*3/uL — ABNORMAL HIGH (ref 1.7–7.7)
Neutrophils Relative %: 83 %
Platelets: 290 10*3/uL (ref 150–400)
RBC: 5.1 MIL/uL (ref 4.22–5.81)
RDW: 13.3 % (ref 11.5–15.5)
WBC: 11.1 10*3/uL — ABNORMAL HIGH (ref 4.0–10.5)
nRBC: 0 % (ref 0.0–0.2)

## 2021-02-05 LAB — COMPREHENSIVE METABOLIC PANEL
ALT: 64 U/L — ABNORMAL HIGH (ref 0–44)
AST: 42 U/L — ABNORMAL HIGH (ref 15–41)
Albumin: 4.1 g/dL (ref 3.5–5.0)
Alkaline Phosphatase: 88 U/L (ref 38–126)
Anion gap: 15 (ref 5–15)
BUN: 12 mg/dL (ref 6–20)
CO2: 18 mmol/L — ABNORMAL LOW (ref 22–32)
Calcium: 9.9 mg/dL (ref 8.9–10.3)
Chloride: 99 mmol/L (ref 98–111)
Creatinine, Ser: 1.32 mg/dL — ABNORMAL HIGH (ref 0.61–1.24)
GFR, Estimated: >60 mL/min (ref >60)
Glucose, Bld: 290 mg/dL — ABNORMAL HIGH (ref 70–99)
Potassium: 4.1 mmol/L (ref 3.5–5.1)
Sodium: 132 mmol/L — ABNORMAL LOW (ref 135–145)
Total Bilirubin: 1.3 mg/dL — ABNORMAL HIGH (ref 0.3–1.2)
Total Protein: 7.8 g/dL (ref 6.5–8.1)

## 2021-02-05 LAB — LIPASE, BLOOD: Lipase: 29 U/L (ref 11–51)

## 2021-02-05 LAB — CBG MONITORING, ED: Glucose-Capillary: 250 mg/dL — ABNORMAL HIGH (ref 70–99)

## 2021-02-05 LAB — LACTIC ACID, PLASMA
Lactic Acid, Venous: 3.2 mmol/L (ref 0.5–1.9)
Lactic Acid, Venous: 2 mmol/L (ref 0.5–1.9)

## 2021-02-05 MED ORDER — MORPHINE SULFATE (PF) 4 MG/ML IV SOLN
4.0000 mg | Freq: Once | INTRAVENOUS | Status: AC
  Administered 2021-02-05: 4 mg via INTRAVENOUS
  Filled 2021-02-05: qty 1

## 2021-02-05 MED ORDER — SODIUM CHLORIDE 0.9 % IV BOLUS
1000.0000 mL | Freq: Once | INTRAVENOUS | Status: AC
  Administered 2021-02-05: 1000 mL via INTRAVENOUS

## 2021-02-05 MED ORDER — IOHEXOL 350 MG/ML SOLN
80.0000 mL | Freq: Once | INTRAVENOUS | Status: AC | PRN
  Administered 2021-02-05: 80 mL via INTRAVENOUS

## 2021-02-05 MED ORDER — ONDANSETRON HCL 4 MG/2ML IJ SOLN
4.0000 mg | Freq: Once | INTRAMUSCULAR | Status: AC
  Administered 2021-02-05: 4 mg via INTRAVENOUS
  Filled 2021-02-05: qty 2

## 2021-02-05 MED ORDER — METOCLOPRAMIDE HCL 5 MG/ML IJ SOLN
10.0000 mg | INTRAMUSCULAR | Status: AC
  Administered 2021-02-05: 10 mg via INTRAVENOUS
  Filled 2021-02-05: qty 2

## 2021-02-05 MED ORDER — DICYCLOMINE HCL 20 MG PO TABS: 20.0000 mg | ORAL_TABLET | Freq: Two times a day (BID) | ORAL | 0 refills | Status: DC

## 2021-02-05 MED ORDER — ONDANSETRON 4 MG PO TBDP: 4.0000 mg | ORAL_TABLET | Freq: Three times a day (TID) | ORAL | 0 refills | Status: DC | PRN

## 2021-02-05 NOTE — Discharge Instructions (Addendum)
Take the prescribed medication as directed.  Make sure to continue drinking fluids to stay hydrated.  I would start with bland diet and progress as tolerated. Keep a close eye on your blood sugar. Follow-up with your primary care doctor. Return to the ED for new or worsening symptoms.

## 2021-02-05 NOTE — ED Notes (Signed)
Patient transported to CT 

## 2021-02-05 NOTE — ED Notes (Signed)
Pt able to tolerate fluids 

## 2021-02-05 NOTE — ED Triage Notes (Signed)
Pt BIB [facility] from home c/o acute onset of abdominal pain onset yesterday worsening today with ongoing vomitting. Pt received 100 mcg fent and 4 zofran. No history abdominal surgery.

## 2021-02-05 NOTE — ED Notes (Addendum)
Patient transported to CT 

## 2021-02-05 NOTE — ED Provider Notes (Signed)
[facility] EMERGENCY DEPARTMENT Provider Note   CSN: [id number] Arrival date & time: 02/05/21  0006     History Chief Complaint  Patient presents with   Abdominal Pain    [name] is a 55 y.o. male.  The history is provided by the patient and medical records.  Abdominal Pain Associated symptoms: nausea and vomiting    55 y.o. M with hx of ADHD, depression, DM, HTN, HLP, presenting to the ED with abdominal pain.  Patient reports pain has been ongoing for about 24 hours or so.  States pain is all throughout the abdomen, may be slightly worse on his left side.  He did report some diarrhea initially but that is since resolved and now having nausea/vomiting.  He is diabetic, has not checked his sugar today.  He denies any fever.  No prior abdominal surgeries.  Past Medical History:  Diagnosis Date   ADHD (attention deficit hyperactivity disorder)    Anxiety and depression    Candida esophagitis (HCC)    in setting of DKA/hyperglycemia   CIDP (chronic inflammatory demyelinating polyneuropathy) (HCC)    Depression    Diabetes type 2, controlled (HCC)    Diverticulosis    Essential hypertension 06/21/2014   Hyperlipidemia    Kidney stones    Pancreatitis    Stomach ulcer    Vitamin D deficiency     Patient Active Problem List   Diagnosis Date Noted   Foot ulcer with fat layer exposed, left (HCC) 11/15/2019   Displaced fracture of shaft of left clavicle, initial encounter for closed fracture 03/15/2019   Dupuytren's contracture 07/29/2018   PICC (peripherally inserted central catheter) in place 05/24/2018   Neuropathy 05/12/2018   Osteomyelitis of ankle or foot, acute, left (HCC) 05/05/2018   Diabetic ulcer of left midfoot associated with type 2 diabetes mellitus, limited to breakdown of skin (HCC) 12/18/2017   Chronic pain syndrome 11/03/2017   Gastroesophageal reflux disease with esophagitis without hemorrhage 09/17/2017   Esophageal dysphagia     DKA, type 2 (HCC) 07/15/2017   ADD (attention deficit disorder) 07/15/2017   DKA (diabetic ketoacidoses) 07/15/2017   Vitamin B12 deficiency (non anemic) 08/13/2016   Adhesive capsulitis of left shoulder 01/18/2016   Trigger ring finger of right hand 10/11/2015   Primary osteoarthritis of first carpometacarpal joint of left hand 06/14/2015   Radial styloid tenosynovitis 06/14/2015   Depression 10/05/2014   Noncompliance with diabetes treatment 10/05/2014   Vitamin D deficiency 09/14/2014   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   Diabetic peripheral neuropathy associated with type 2 diabetes mellitus (HCC) 06/21/2014   Essential hypertension 06/21/2014   Erectile dysfunction associated with type 2 diabetes mellitus (HCC) 06/21/2014   Long-term current use of insulin for diabetes mellitus (HCC) 06/21/2014   Adhesive capsulitis of right shoulder 03/24/2011   Retinal detachment 12/06/2010   Rotator cuff syndrome 12/06/2010   History of pancreatitis 10/22/2010   Hypertriglyceridemia 10/22/2010   Diabetes mellitus type II, uncontrolled (HCC) 10/22/2010    Past Surgical History:  Procedure Laterality Date   ANKLE SURGERY     CERVICAL DISC SURGERY     C4-C5   ELBOW SURGERY     ESOPHAGOGASTRODUODENOSCOPY N/A 07/16/2017   Procedure: ESOPHAGOGASTRODUODENOSCOPY (EGD);  Surgeon: [name], MD;  Location: [facility] ENDOSCOPY;  Service: Endoscopy;  Laterality: N/A;   RETINAL DETACHMENT SURGERY     RHINOPLASTY     VASECTOMY     WOUND DEBRIDEMENT Left 03/27/2018   Procedure: DEBRIDEMENT WOUND;  Surgeon: Vivi Barrack, DPM;  Location: [facility] OR;  Service: Podiatry;  Laterality: Left;       Family History  Problem Relation Age of Onset   Stroke Father    Breast cancer Mother    Colon cancer Neg Hx     Social History   Tobacco Use   Smoking status: Former   Smokeless tobacco: Former  Substance Use Topics   Alcohol use: Yes    Comment: Socially   Drug use: No    Home  Medications Prior to Admission medications   Medication Sig Start Date End Date Taking? Authorizing Provider  acetaminophen (TYLENOL) 325 MG tablet Take by mouth.    [provider]  Amphetamine Sulfate (EVEKEO) 10 MG TABS Take 20 mg by mouth 2 (two) times daily.     [provider]  aspirin 81 MG tablet Take 1 tablet (81 mg total) by mouth daily. 07/17/17   Barnetta Chapel, MD  atorvastatin (LIPITOR) 10 MG tablet  04/03/20   [provider]  betamethasone acetate-betamethasone sodium phosphate (CELESTONE) 6 (3-3) MG/ML injection  05/19/19   [provider]  buPROPion (WELLBUTRIN XL) 150 MG 24 hr tablet  04/01/18   [provider]  calcium carbonate (OS-CAL) 1250 (500 Ca) MG chewable tablet Chew by mouth.    [provider]  calcium carbonate (TUMS) 500 MG chewable tablet Chew 1 tablet by mouth daily as needed.    [provider]  Continuous Blood Gluc Receiver (FREESTYLE LIBRE 14 DAY READER) DEVI every 14 (fourteen) days.  01/05/18   [provider]  Continuous Blood Gluc Sensor (FREESTYLE LIBRE 14 DAY SENSOR) MISC  04/14/18   [provider]  cyclobenzaprine (FLEXERIL) 5 MG tablet Take by mouth. 07/30/20   [provider]  doxycycline (VIBRA-TABS) 100 MG tablet Take 1 tablet (100 mg total) by mouth 2 (two) times daily. 01/09/21   Vivi Barrack, DPM  doxycycline (VIBRA-TABS) 100 MG tablet Take 1 tablet (100 mg total) by mouth 2 (two) times daily. 01/23/21   Vivi Barrack, DPM  DULoxetine (CYMBALTA) 60 MG capsule Take 60 mg by mouth daily. 02/26/18   [provider]  ergocalciferol (VITAMIN D2) 1.25 MG (50000 UT) capsule Take 1 capsule by mouth once a week. 08/13/16   [provider]  fenofibrate 160 MG tablet Take 160 mg by mouth daily.    [provider]  fenofibrate 160 MG tablet Take 1 tablet by mouth daily. 11/26/20 11/26/21  [provider]  fluconazole (DIFLUCAN)  100 MG tablet Take 2 tablets by mouth on day 1 then take 1 tablet daily until gone 07/22/17   [name], MD  gabapentin (NEURONTIN) 800 MG tablet Take 800 mg by mouth 3 (three) times daily.    [provider]  gabapentin (NEURONTIN) 800 MG tablet Take 1 tablet by mouth 3 (three) times daily. 03/05/20   [provider]  gentamicin cream (GARAMYCIN) 0.1 % Apply 1 application topically in the morning and at bedtime. 11/16/20   Vivi Barrack, DPM  HYDROcodone-acetaminophen (NORCO/VICODIN) 5-325 MG tablet Take 1-2 tablets by mouth every 6 (six) hours as needed. 01/23/21   Vivi Barrack, DPM  insulin aspart (NOVOLOG FLEXPEN) 100 UNIT/ML FlexPen Inject 22-32 Units into the skin See admin instructions. Inject 22-32 units into the skin three to four times a day before meals, per sliding scale    [provider]  Insulin Glargine (LANTUS SOLOSTAR) 100 UNIT/ML Solostar Pen Inject  70 Units into the skin at bedtime.    [provider]  insulin NPH-regular Human (NOVOLIN 70/30) (70-30) 100 UNIT/ML injection Insulin NPH 40 units in am and 30 Units at night time. 07/16/17   Barnetta Chapel, MD  lamoTRIgine (LAMICTAL) 150 MG tablet Take 1 tablet by mouth daily. 05/12/18 06/11/18  [provider]  lamoTRIgine (LAMICTAL) 150 MG tablet TAKE 1 TABLET(150 MG) BY MOUTH TWICE DAILY 03/02/20   [provider]  morphine (MSIR) 15 MG tablet SMARTSIG:0.5 Tablet(s) By Mouth Every 4 Hours 02/13/19   [provider]  ondansetron (ZOFRAN) 4 MG tablet Take 1 tablet (4 mg total) by mouth every 8 (eight) hours as needed for nausea or vomiting. 01/23/21   Vivi Barrack, DPM  oxyCODONE-acetaminophen (PERCOCET/ROXICET) 5-325 MG tablet Take by mouth. 07/25/19   [provider]  pantoprazole (PROTONIX) 40 MG tablet Take 1 tablet (40 mg total) by mouth daily before breakfast. 07/17/17   Barnetta Chapel, MD  promethazine (PHENERGAN) 25 MG tablet Take 1  tablet (25 mg total) by mouth every 8 (eight) hours as needed for nausea or vomiting. 01/11/20   Vivi Barrack, DPM  ramipril (ALTACE) 2.5 MG capsule Take by mouth. 05/06/20   [provider]  SEMGLEE, YFGN, 100 UNIT/ML Pen SMARTSIG:80 Unit(s) SUB-Q Every Night 01/07/21   [provider]  sildenafil (REVATIO) 20 MG tablet Take 3 tablets by mouth one hour prior to sex as needed. Do not take more than one dose every 24 hours. 08/20/18   [provider]  silver sulfADIAZINE (SILVADENE) 1 % cream Apply 1 application topically daily. 01/06/20   Vivi Barrack, DPM  silver sulfADIAZINE (SILVADENE) 1 % cream Apply 1 application topically daily. 06/25/20   Vivi Barrack, DPM  sulfamethoxazole-trimethoprim (BACTRIM DS) 800-160 MG tablet Take 1 tablet by mouth 2 (two) times daily. 12/24/20   Vivi Barrack, DPM  TECHLITE PEN NEEDLES 31G X 8 MM MISC USE TO GIVE INSULIN 4 TIMES DAILY 09/01/19   [provider]  XOFLUZA, 80 MG DOSE, 2 x 40 MG TBPK Take 2 tablets by mouth once. 04/25/20   [provider]  zolpidem (AMBIEN) 10 MG tablet Take 10 mg by mouth at bedtime as needed for sleep.     [provider]    Allergies    Lithium, Adderall [amphetamine-dextroamphetamine], and Ritalin [methylphenidate]  Review of Systems   Review of Systems  Gastrointestinal:  Positive for abdominal pain, nausea and vomiting.  All other systems reviewed and are negative.  Physical Exam Updated Vital Signs BP (!) 195/94   Pulse 66   Temp 97.6 F (36.4 C) (Oral)   Resp 18   Ht 6\' 4"  (1.93 m)   Wt 111.1 kg   SpO2 100%   BMI 29.82 kg/m   Physical Exam Vitals and nursing note reviewed.  Constitutional:      General: He is not in acute distress.    Appearance: He is well-developed. He is not diaphoretic.     Comments: Clammy, appears uncomfortable, dry heaving  HENT:     Head: Normocephalic and atraumatic.  Eyes:     Conjunctiva/sclera: Conjunctivae  normal.     Pupils: Pupils are equal, round, and reactive to light.  Cardiovascular:     Rate and Rhythm: Normal rate and regular rhythm.     Heart sounds: Normal heart sounds.  Pulmonary:     Effort: Pulmonary effort is normal.     Breath sounds: Normal  breath sounds.  Abdominal:     General: Bowel sounds are normal.     Palpations: Abdomen is soft.     Tenderness: There is generalized abdominal tenderness.  Musculoskeletal:        General: Normal range of motion.     Cervical back: Normal range of motion.  Skin:    General: Skin is warm and dry.  Neurological:     Mental Status: He is alert and oriented to person, place, and time.    ED Results / Procedures / Treatments   Labs (all labs ordered are listed, but only abnormal results are displayed) Labs Reviewed  LACTIC ACID, PLASMA - Abnormal; Notable for the following components:      Result Value   Lactic Acid, Venous 3.2 (*)    All other components within normal limits  CBC WITH DIFFERENTIAL/PLATELET - Abnormal; Notable for the following components:   WBC 11.1 (*)    Neutro Abs 9.2 (*)    All other components within normal limits  COMPREHENSIVE METABOLIC PANEL - Abnormal; Notable for the following components:   Sodium 132 (*)    CO2 18 (*)    Glucose, Bld 290 (*)    Creatinine, Ser 1.32 (*)    AST 42 (*)    ALT 64 (*)    Total Bilirubin 1.3 (*)    All other components within normal limits  URINALYSIS, ROUTINE W REFLEX MICROSCOPIC - Abnormal; Notable for the following components:   Specific Gravity, Urine 1.039 (*)    Glucose, UA >=500 (*)    Hgb urine dipstick SMALL (*)    Ketones, ur 80 (*)    All other components within normal limits  LACTIC ACID, PLASMA - Abnormal; Notable for the following components:   Lactic Acid, Venous 2.0 (*)    All other components within normal limits  CBG MONITORING, ED - Abnormal; Notable for the following components:   Glucose-Capillary 250 (*)    All other components within  normal limits  LIPASE, BLOOD    EKG None  Radiology CT ABDOMEN PELVIS W CONTRAST  Result Date: 02/05/2021 CLINICAL DATA:  Acute nonlocalized, diffuse abdominal pain, vomiting, diaphoresis EXAM: CT ABDOMEN AND PELVIS WITH CONTRAST TECHNIQUE: Multidetector CT imaging of the abdomen and pelvis was performed using the standard protocol following bolus administration of intravenous contrast. CONTRAST:  68mL OMNIPAQUE IOHEXOL 350 MG/ML SOLN COMPARISON:  12/24/2010, 10/16/2010 FINDINGS: Lower chest: No acute abnormality. Hepatobiliary: No focal liver abnormality is seen. No gallstones, gallbladder wall thickening, or biliary dilatation. Pancreas: Unremarkable Spleen: Unremarkable Adrenals/Urinary Tract: Adrenal glands are unremarkable. Kidneys are normal, without renal calculi, focal lesion, or hydronephrosis. Bladder is unremarkable. Stomach/Bowel: Mild descending and sigmoid colonic diverticulosis. The stomach, small bowel, and large bowel are otherwise unremarkable. Appendix normal. No evidence of obstruction or focal inflammation. No free intraperitoneal gas or fluid. Vascular/Lymphatic: Mild atherosclerotic calcification within the abdominal aorta. No aortic aneurysm. No pathologic adenopathy within the abdomen and pelvis. Reproductive: Prostate is unremarkable. Other: Tiny fat containing left inguinal hernia. Musculoskeletal: No acute bone abnormality. Degenerative changes are seen within the lumbar spine. No lytic or blastic bone lesions are identified. IMPRESSION: No acute intra-abdominal pathology identified. No definite radiographic explanation for the patient's reported symptoms. Mild distal colonic diverticulosis without superimposed inflammatory change. Aortic Atherosclerosis (ICD10-I70.0). Electronically Signed   By: Helyn Numbers M.D.   On: 02/05/2021 02:16    Procedures Procedures   Medications Ordered in ED Medications  sodium chloride 0.9 % bolus 1,000 mL (0  mLs Intravenous Stopped 02/05/21  0146)  ondansetron (ZOFRAN) injection 4 mg (4 mg Intravenous Given 02/05/21 0058)  morphine 4 MG/ML injection 4 mg (4 mg Intravenous Given 02/05/21 0057)  metoCLOPramide (REGLAN) injection 10 mg (10 mg Intravenous Given 02/05/21 0145)  sodium chloride 0.9 % bolus 1,000 mL (0 mLs Intravenous Stopped 02/05/21 0237)  iohexol (OMNIPAQUE) 350 MG/ML injection 80 mL (80 mLs Intravenous Contrast Given 02/05/21 0208)    ED Course  I have reviewed the triage vital signs and the nursing notes.  Pertinent labs & imaging results that were available during my care of the patient were reviewed by me and considered in my medical decision making (see chart for details).    MDM Rules/Calculators/A&P                           55 year old male presenting to the ED with abdominal pain.  States that this began about 24 hours ago, initially had some diarrhea but that is since resolved and now has nausea/vomiting.  He is afebrile, however does appear clammy and unwell at present.  He is dry heaving.  Labs are pending.  He is diabetic so we will check CBG.  IV fluids, antiemetics, analgesia given.  Labs as above-- WBC count 11.1, lactate 3.2.  does have mildly low bicarb at 18 but maintains normal anion gap.  Ketones in urine.  Glucose is only 290 which appears to be around his baseline compared with prior values.  Clinically I suspect this is more from GI loss/dehydration than DKA.  CT without acute findings. Patient has been given additional fluids and actually appears significantly improved.  States this is the best he has felt since illness began.  Will PO challenge.  5:44 AM Patient has tolerated PO, continues to feel much better.  VSS.  Feel he is stable for discharge with continued symptomatic care.  Will continue to push oral fluids, monitor CBG.  Follow-up with primary care doctor.  Return here for new concerns.  Final Clinical Impression(s) / ED Diagnoses Final diagnoses:  Non-intractable vomiting with nausea,  unspecified vomiting type    Rx / DC Orders ED Discharge Orders     None        Garlon HatchetSanders, Carolena Fairbank M, PA-C 02/05/21 [id number]    Nira Connardama, Pedro Eduardo, MD 02/05/21 [phone]

## 2021-02-05 NOTE — Progress Notes (Signed)
Subjective: [name] is a 55 y.o. 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  He is not having significant pain.  Still on the antibiotics.  Denies any systemic complaints such as fevers, chills, nausea, vomiting. No calf pain, chest pain, shortness of breath.   Objective: General: No acute distress, AAOx3  DP/PT pulses palpable 2/4, CRT < 3 sec to all digits.  Left foot: Incision is well coapted without any evidence of dehiscence with sutures intact. There is no surrounding erythema, ascending cellulitis, fluctuance, crepitus, malodor, drainage/purulence. There is improved edema to the foot compared to prior to surgery.  There is no pain along the surgical site.  To the lateral aspect of the left foot is a superficial area of skin breakdown without any surrounding erythema, ascending cellulitis.  Mild macerated tissue.  No fluctuance crepitation there is no malodor. No other areas of tenderness to bilateral lower extremities.  No other open lesions or pre-ulcerative lesions.  No pain with calf compression, swelling, warmth, erythema.   Assessment and Plan:  Status post left partial fourth ray amputation, doing well with no complications; lateral foot wound  -Treatment options discussed including all alternatives, risks, and complications -X-rays obtained reviewed.  Amputation margin is sharp.  There is no evidence of acute fracture, osteomyelitis. -From a surgical standpoint he is healing well at this time.  Small mount of Betadine was applied Nonstick dressing and a dry sterile dressing. -To the wound on the lateral aspect a small mount of Betadine was applied.  There was some macerated tissue around the ulcer.  Recommend dressing changes every other day.  Wound care referral placed. -Elevation -NWB -Monitor for any clinical signs or symptoms of infection and DVT/PE and directed to call the office immediately should any occur or go to the  ER. -Follow-up as scheduled or sooner if any problems arise. In the meantime, encouraged to call the office with any questions, concerns, change in symptoms.   Ovid Curd, DPM

## 2021-02-07 ENCOUNTER — Ambulatory Visit (INDEPENDENT_AMBULATORY_CARE_PROVIDER_SITE_OTHER): Payer: BLUE CROSS/BLUE SHIELD | Admitting: Podiatry

## 2021-02-07 ENCOUNTER — Other Ambulatory Visit: Payer: Self-pay

## 2021-02-07 DIAGNOSIS — L97522 Non-pressure chronic ulcer of other part of left foot with fat layer exposed: Secondary | ICD-10-CM

## 2021-02-07 DIAGNOSIS — M869 Osteomyelitis, unspecified: Secondary | ICD-10-CM

## 2021-02-12 ENCOUNTER — Telehealth: Payer: Self-pay | Admitting: *Deleted

## 2021-02-12 NOTE — Progress Notes (Signed)
Subjective: [name] is a 55 y.o. 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  He is not having significant pain.  He can stop antibiotics as he has been in the emergency room due to nausea and vomiting.  He followed with his primary care physician earlier today and thinks he has gastroparesis.  No fevers or chills.  Objective: General: No acute distress, AAOx3  DP/PT pulses palpable 2/4, CRT < 3 sec to all digits.  Left foot: Incision is well coapted without any evidence of dehiscence with sutures intact.  There is no drainage or pus.  There is minimal edema.  There is no erythema or warmth or ascending cellulitis To the lateral aspect of the left foot is a superficial granular ulceration without any surrounding erythema, ascending cellulitis.  Mild macerated tissue.  No fluctuance crepitation there is no malodor. No other areas of tenderness to bilateral lower extremities.  No other open lesions or pre-ulcerative lesions.  No pain with calf compression, swelling, warmth, erythema.   Assessment and Plan:  Status post left partial fourth ray amputation, doing well with no complications; lateral foot wound  -Treatment options discussed including all alternatives, risks, and complications -Regards to surgical site appears to be healing well at this time.  Sutures are intact with any evidence of dehiscence or signs of infection.  Continue nonweightbearing, cam boot. -Regards to the lateral foot wound I have referred him to the [facility] wound care center.  We will follow-up on this referral. -Monitor for any clinical signs or symptoms of infection and directed to call the office immediately should any occur or go to the ER.  Return in about 1 week (around 02/14/2021).  Vivi Barrack DPM

## 2021-02-12 NOTE — Telephone Encounter (Signed)
Called and spoke with the [facility] Mody) and the representative stated that the patient had an appointment on 02-18-2021 at 9:00 am and the address is [address] [place] and I called and left the message for the patient as well. [name]

## 2021-02-14 ENCOUNTER — Other Ambulatory Visit: Payer: Self-pay

## 2021-02-14 ENCOUNTER — Ambulatory Visit (INDEPENDENT_AMBULATORY_CARE_PROVIDER_SITE_OTHER): Payer: BLUE CROSS/BLUE SHIELD | Admitting: Podiatry

## 2021-02-14 DIAGNOSIS — L97522 Non-pressure chronic ulcer of other part of left foot with fat layer exposed: Secondary | ICD-10-CM

## 2021-02-14 DIAGNOSIS — M869 Osteomyelitis, unspecified: Secondary | ICD-10-CM

## 2021-02-18 NOTE — Progress Notes (Signed)
Subjective: [name] is a 55 y.o. 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  He states that he has been his foot more than he should have.  Schedule follow-up with wound care center on Monday.  Patient had called me on my personal phone over the weekend was concerned about the healing of the plantar incision.  Has been keeping Betadine on this.  Currently denies any fevers or chills.   Objective: General: No acute distress, AAOx3  DP/PT pulses palpable 2/4, CRT < 3 sec to all digits.  Left foot: Incision is well coapted without any evidence of dehiscence with sutures intact.  I remove the majority the suture skin incisions well coapted.  There is no surrounding erythema, ascending cellulitis.  No fluctuance or crepitation.  No malodor. To the lateral aspect of the left foot is a superficial granular ulceration with mild surrounding erythema without any ascending cellulitis.  No fluctuance or crepitation.  No malodor. No other open lesions or pre-ulcerative lesions.  No pain with calf compression, swelling, warmth, erythema.   Assessment and Plan:  Status post left partial fourth ray amputation, doing well with no complications; lateral foot wound  -Treatment options discussed including all alternatives, risks, and complications -I remove the majority of the sutures today but I did leave a couple intact and I will remove the rest next week to make sure it still healing appropriately.  Small amount antibiotic ointment was applied followed by dressing. -Continue with dressing changes for the lateral foot wound as well.  He is scheduled to follow-up with the wound care center on Monday.  He does stop the doxycycline given gastroparesis and he is to go back to starting this given the mild erythema.  He is a monitor closely for any signs or symptoms of worsening infection report to the emergency department should any occur.  Return in about 1 week  (around 02/21/2021) for suture removal .  Vivi Barrack DPM

## 2021-02-21 ENCOUNTER — Encounter: Payer: BLUE CROSS/BLUE SHIELD | Admitting: Podiatry

## 2021-02-25 ENCOUNTER — Encounter: Payer: BLUE CROSS/BLUE SHIELD | Admitting: Podiatry

## 2021-04-09 ENCOUNTER — Telehealth: Payer: Self-pay | Admitting: Podiatry

## 2021-04-09 NOTE — Telephone Encounter (Signed)
Patient called and stated that the wound center wanted Dr. Ardelle Anton to take a look at an ulcer that has worsened on the left foot per the patient. Also patient almost slice his pinky toe and wanted to have it looked at by Dr. Ardelle Anton. Next avail appointment is 11/21. Would you like patient to see another provider or work him in?   Please advise

## 2021-04-11 ENCOUNTER — Other Ambulatory Visit: Payer: Self-pay

## 2021-04-11 ENCOUNTER — Ambulatory Visit (INDEPENDENT_AMBULATORY_CARE_PROVIDER_SITE_OTHER): Payer: BLUE CROSS/BLUE SHIELD

## 2021-04-11 ENCOUNTER — Ambulatory Visit (INDEPENDENT_AMBULATORY_CARE_PROVIDER_SITE_OTHER): Payer: BLUE CROSS/BLUE SHIELD | Admitting: Podiatry

## 2021-04-11 DIAGNOSIS — S91115A Laceration without foreign body of left lesser toe(s) without damage to nail, initial encounter: Secondary | ICD-10-CM

## 2021-04-11 DIAGNOSIS — L97522 Non-pressure chronic ulcer of other part of left foot with fat layer exposed: Secondary | ICD-10-CM

## 2021-04-11 DIAGNOSIS — M869 Osteomyelitis, unspecified: Secondary | ICD-10-CM | POA: Diagnosis not present

## 2021-04-11 DIAGNOSIS — S9032XA Contusion of left foot, initial encounter: Secondary | ICD-10-CM

## 2021-04-14 NOTE — Progress Notes (Signed)
Subjective: 55 year old male presents the office today for concerns of a wound on his left fifth toe.  He states that on Sunday he had an injury where he knocked the various of it he cut his toe on the fifth toe.  He went to wound care on Monday and recommended follow-up with me this week as Dr. Valerie Roys is out of town.  He has an appointment on Monday to have a wound VAC applied to the wound to the fifth metatarsal.  Denies any fevers or chills.  Has no other concerns.    Objective: AAO x3, NAD DP/PT pulses palpable bilaterally, CRT less than 3 seconds Ulceration still noted along the left foot fifth metatarsal base with a granular wound.  There is no surrounding erythema, ascending cellulitis.  There is no exposed bone or tendon.  The new area of concern there is a wound on the plantar, medial, lateral aspect of the fifth toe from the cut.  There is granulation tissue present and is superficial.  There is no probing, undermining or tunneling.  There is no surrounding erythema, ascending cellulitis.  No fluctuation crepitation.  No malodor. No pain with calf compression, swelling, warmth, erythema  Assessment: Ulceration left foot with new laceration  Plan: -All treatment options discussed with the patient including all alternatives, risks, complications.  -It appears that the laceration site is healing well and is superficial.  At this point I cannot suture the laceration.  Recommended Prisma dressing changes for both ulcerations for now and offloading with cam boot, nonweightbearing.  Follows up with wound care on Monday for the wound VAC.  I will defer further treatment to the wound care center.  He needs me at all to let me know.  We did discuss further surgical intervention but he wants to see about the wound VAC to see if this will be helpful.  If not likely proceed with further surgery.  Vivi Barrack DPM

## 2021-04-22 ENCOUNTER — Ambulatory Visit: Payer: BLUE CROSS/BLUE SHIELD | Admitting: Podiatry

## 2021-06-25 ENCOUNTER — Telehealth (HOSPITAL_COMMUNITY): Payer: Self-pay | Admitting: Psychiatry

## 2021-06-25 ENCOUNTER — Encounter (HOSPITAL_COMMUNITY): Payer: Self-pay | Admitting: Psychiatry

## 2021-06-25 ENCOUNTER — Ambulatory Visit (HOSPITAL_COMMUNITY): Payer: Self-pay | Admitting: Psychiatry

## 2021-06-25 NOTE — Telephone Encounter (Signed)
Patient has an active psychiatry provider with [facility] and is being prescribed and managed medications . States seen last month and has upcoming appointment scheduled. He felt this was for therapy appointment . He is advised to follow up with his active provider for concerns;medciations if any  for and front office notified to schedule for therapy here

## 2021-07-22 ENCOUNTER — Ambulatory Visit (HOSPITAL_COMMUNITY): Payer: BC Managed Care – PPO | Admitting: Licensed Clinical Social Worker

## 2021-07-22 NOTE — Progress Notes (Signed)
Patient was scheduled for in person assessment and he did not show session as a no-show.  Reviewed notes by Lottie Rater, PA from [facility] and diagnosis from these notes to include depression, unspecified, bipolar 2 disorder and records patient being treated for ADHD.

## 2021-08-06 ENCOUNTER — Ambulatory Visit (INDEPENDENT_AMBULATORY_CARE_PROVIDER_SITE_OTHER): Payer: BLUE CROSS/BLUE SHIELD

## 2021-08-06 ENCOUNTER — Ambulatory Visit (INDEPENDENT_AMBULATORY_CARE_PROVIDER_SITE_OTHER): Payer: BLUE CROSS/BLUE SHIELD | Admitting: Podiatry

## 2021-08-06 ENCOUNTER — Other Ambulatory Visit: Payer: Self-pay

## 2021-08-06 DIAGNOSIS — M869 Osteomyelitis, unspecified: Secondary | ICD-10-CM

## 2021-08-06 DIAGNOSIS — L97522 Non-pressure chronic ulcer of other part of left foot with fat layer exposed: Secondary | ICD-10-CM | POA: Diagnosis not present

## 2021-08-07 LAB — CBC WITH DIFFERENTIAL/PLATELET
Basophils Absolute: 0.1 10*3/uL (ref 0.0–0.2)
Basos: 1 %
EOS (ABSOLUTE): 0.2 10*3/uL (ref 0.0–0.4)
Eos: 4 %
Hematocrit: 44.6 % (ref 37.5–51.0)
Hemoglobin: 14.7 g/dL (ref 13.0–17.7)
Immature Grans (Abs): 0 10*3/uL (ref 0.0–0.1)
Immature Granulocytes: 0 %
Lymphocytes Absolute: 2.1 10*3/uL (ref 0.7–3.1)
Lymphs: 33 %
MCH: 29.5 pg (ref 26.6–33.0)
MCHC: 33 g/dL (ref 31.5–35.7)
MCV: 90 fL (ref 79–97)
Monocytes Absolute: 0.4 10*3/uL (ref 0.1–0.9)
Monocytes: 7 %
Neutrophils Absolute: 3.5 10*3/uL (ref 1.4–7.0)
Neutrophils: 55 %
Platelets: 222 10*3/uL (ref 150–450)
RBC: 4.98 x10E6/uL (ref 4.14–5.80)
RDW: 13.8 % (ref 11.6–15.4)
WBC: 6.3 10*3/uL (ref 3.4–10.8)

## 2021-08-07 LAB — BASIC METABOLIC PANEL
BUN/Creatinine Ratio: 9 (ref 9–20)
BUN: 10 mg/dL (ref 6–24)
CO2: 24 mmol/L (ref 20–29)
Calcium: 9.8 mg/dL (ref 8.7–10.2)
Chloride: 99 mmol/L (ref 96–106)
Creatinine, Ser: 1.14 mg/dL (ref 0.76–1.27)
Glucose: 367 mg/dL — ABNORMAL HIGH (ref 70–99)
Potassium: 5 mmol/L (ref 3.5–5.2)
Sodium: 136 mmol/L (ref 134–144)
eGFR: 75 mL/min/{1.73_m2} (ref >59)

## 2021-08-07 LAB — SEDIMENTATION RATE: Sed Rate: 4 mm/hr (ref 0–30)

## 2021-08-07 LAB — C-REACTIVE PROTEIN: CRP: 6 mg/L (ref 0–10)

## 2021-08-08 NOTE — Progress Notes (Signed)
Subjective: ?56 year old male presents the office today for concerns of ongoing wound to his left foot.  Is been at the wound care center being treated with total contact casting.  He wants me to evaluate the wound.  He is seeing wound care later on today.  No fevers or chills.  Feels that the wound has been stagnant. ? ?Objective: ?AAO x3, NAD ?DP/PT pulses palpable bilaterally, CRT less than 3 seconds ?Ulceration still noted along the left foot fifth metatarsal base with a granular wound.  The wound does not probe to bone.  There is also superficial wound present on the lateral ankle which is very superficial limited to the breakdown of skin without any probing.  There is some faint surrounding erythema likely more from inflammation.  There is no drainage or pus.  No fluctuation or crepitation to the wounds.  No malodor.  Cavus deformity is noted. ?No pain with calf compression, swelling, warmth, erythema ? ?Assessment: ?Ulceration left foot ? ?Plan: ?-All treatment options discussed with the patient including all alternatives, risks, complications.  ?-X-rays obtained and reviewed.  No definitive evidence of acute osteomyelitis or fracture. ?-I did not debride the wound is going to wound care later today and I want them to evaluate the wound as they have been following the progress.  I discussed with him possible surgical intervention to debride and graft the wound.  Prior to this, order blood work and if abnormal will order MRI.  Discussed the plan with him and he agrees has no further questions. ? ?Vivi Barrack DPM ? ? ?

## 2021-08-09 ENCOUNTER — Other Ambulatory Visit: Payer: Self-pay | Admitting: Podiatry

## 2021-08-09 DIAGNOSIS — M869 Osteomyelitis, unspecified: Secondary | ICD-10-CM

## 2021-08-20 ENCOUNTER — Ambulatory Visit: Payer: BLUE CROSS/BLUE SHIELD

## 2021-08-20 ENCOUNTER — Other Ambulatory Visit: Payer: Self-pay

## 2021-08-20 DIAGNOSIS — G600 Hereditary motor and sensory neuropathy: Secondary | ICD-10-CM

## 2021-08-20 DIAGNOSIS — M216X9 Other acquired deformities of unspecified foot: Secondary | ICD-10-CM

## 2021-08-20 DIAGNOSIS — E1149 Type 2 diabetes mellitus with other diabetic neurological complication: Secondary | ICD-10-CM

## 2021-08-20 NOTE — Progress Notes (Signed)
SITUATION ?Reason for Consult: Follow up with non-TFC AFO. ?Patient / Caregiver Report: Patient presents with an AFO from [facility] that he self purchased less than one year ago and now has an ulcer and soft cast on that foot and has been unable to use the AFO. ? ?OBJECTIVE DATA ?History / Diagnosis:  ?  ICD-10-CM   ?1. 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  ?2. 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  ?3. 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  ? ? ?Change in Pathology: Ulceration. ? ?ACTIONS PERFORMED ?Observed and recognized all patient concerns. Discussed treatment options and recommended patient talk to Dr. Ardelle Anton regarding getting an insurance approved AFO as well as diabetic shoes and insoles. Patient agrees with plan of care. All questions answered and concerns addressed. ? ?PLAN ?Patient to be scheduled for evaluation after next seeing Dr. Ardelle Anton and confirming treatment plan. Plan of care discussed with and agreed upon by patient / caregiver. ? ?

## 2021-08-21 ENCOUNTER — Other Ambulatory Visit: Payer: BLUE CROSS/BLUE SHIELD

## 2021-08-21 ENCOUNTER — Other Ambulatory Visit: Payer: Self-pay | Admitting: Podiatry

## 2021-08-21 DIAGNOSIS — M869 Osteomyelitis, unspecified: Secondary | ICD-10-CM

## 2021-08-27 ENCOUNTER — Other Ambulatory Visit: Payer: Self-pay

## 2021-08-27 ENCOUNTER — Ambulatory Visit (INDEPENDENT_AMBULATORY_CARE_PROVIDER_SITE_OTHER): Payer: BLUE CROSS/BLUE SHIELD

## 2021-08-27 DIAGNOSIS — E1149 Type 2 diabetes mellitus with other diabetic neurological complication: Secondary | ICD-10-CM

## 2021-08-27 DIAGNOSIS — M216X9 Other acquired deformities of unspecified foot: Secondary | ICD-10-CM | POA: Diagnosis not present

## 2021-08-27 DIAGNOSIS — G600 Hereditary motor and sensory neuropathy: Secondary | ICD-10-CM

## 2021-08-27 DIAGNOSIS — S99912A Unspecified injury of left ankle, initial encounter: Secondary | ICD-10-CM

## 2021-08-27 NOTE — Progress Notes (Signed)
SITUATION ?Reason for Consult: Evaluation for Prefabricated Diabetic Shoes and Custom Diabetic Inserts. ?Patient / Caregiver Report: Patient would like well fitting shoes ? ?OBJECTIVE DATA: ?Patient History / Diagnosis:  ?  ICD-10-CM   ?1. 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  ?2. 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  ?3. 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  ? ?Physician Treating Diabetes:  Lise Auer - [facility] ? ?Current or Previous Devices:   None and no history ? ?In-Person Foot Examination: ?Ulcers & Callousing:   Historical ?Deformities:    Pes cavus ?Sensation:    compromised  ?Shoe Size:     18M ? ?ORTHOTIC RECOMMENDATION ?Recommended Devices: ?- 1x pair prefabricated PDAC approved diabetic shoes; Patient Selected Apex V753M Size 18M ?- 3x pair custom-to-patient PDAC approved vacuum formed diabetic insoles. ? ?GOALS OF SHOES AND INSOLES ?- Reduce shear and pressure ?- Reduce / Prevent callus formation ?- Reduce / Prevent ulceration ?- Protect the fragile healing compromised diabetic foot. ? ?Patient would benefit from diabetic shoes and inserts as patient has diabetes mellitus and the patient has one or more of the following conditions: ?- History of partial or complete amputation of the foot ?- History of previous foot ulceration. ?- History of pre-ulcerative callus ?- Peripheral neuropathy with evidence of callus formation ?- Foot deformity ?- Poor circulation ? ?ACTIONS PERFORMED ?Potential out of pocket cost was communicated to patient. Patient understood and consented to measurement and casting. Patient was casted for insoles via crush box and measured for shoes via brannock device. Procedure was explained and patient tolerated procedure well. All questions were answered and concerns addressed. Casts were shipped to central fabrication for HOLD until Certificate of Medical Necessity or otherwise necessary authorization from insurance is  obtained. ? ?PLAN ?Shoes are to be ordered and casts released from hold once all appropriate paperwork is complete. Patient is to be contacted and scheduled for fitting once shoes and insoles have been fabricated and received. ? ?

## 2021-09-08 IMAGING — DX DG FOOT COMPLETE 3+V*L*
3 series · 3 of 3 positions shown · non-contrast
Comparison: Most recent radiograph 02/02/2020

CLINICAL DATA: Diabetic ulcer of left toe. Site of ulcer is not
specified.

EXAM:
LEFT FOOT - COMPLETE 3+ VIEW

[foot ap]
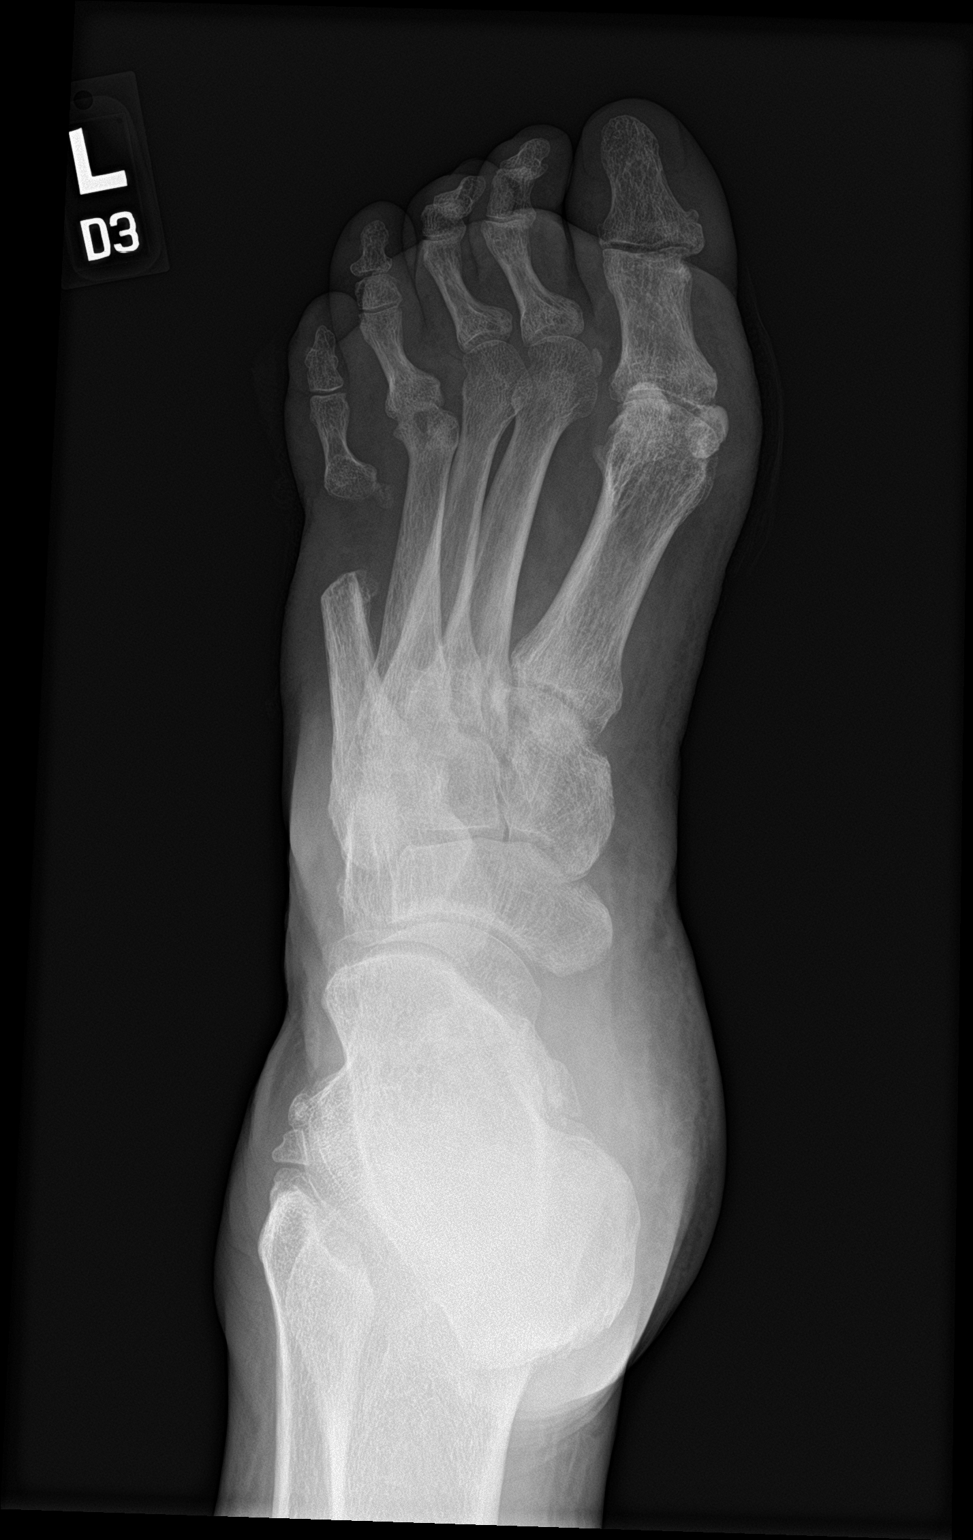

[foot obl]
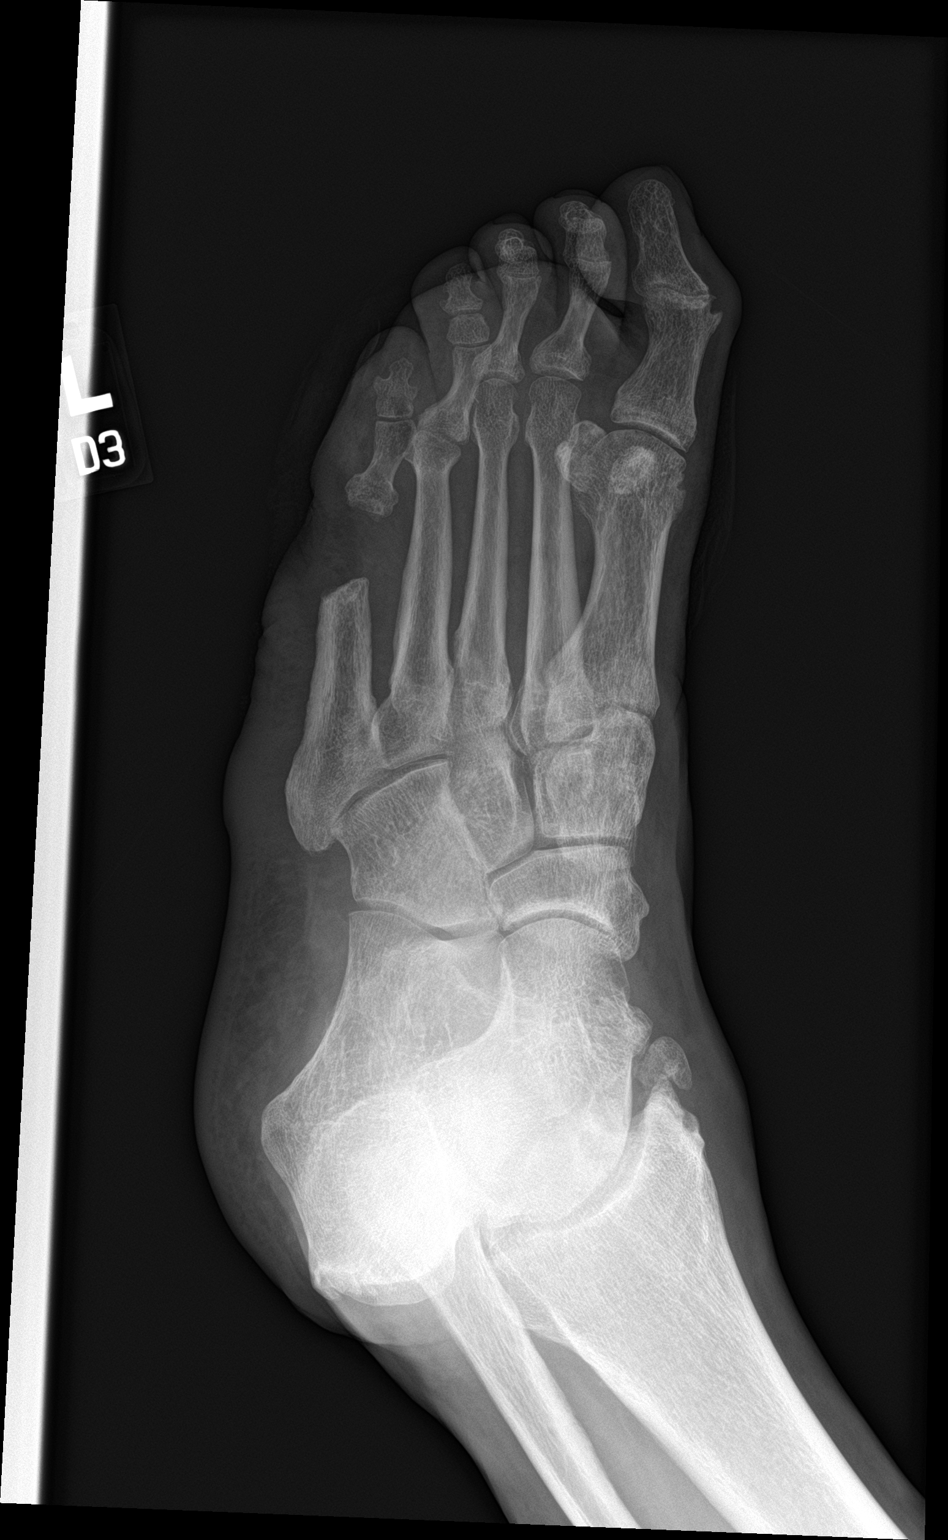

[foot lat]
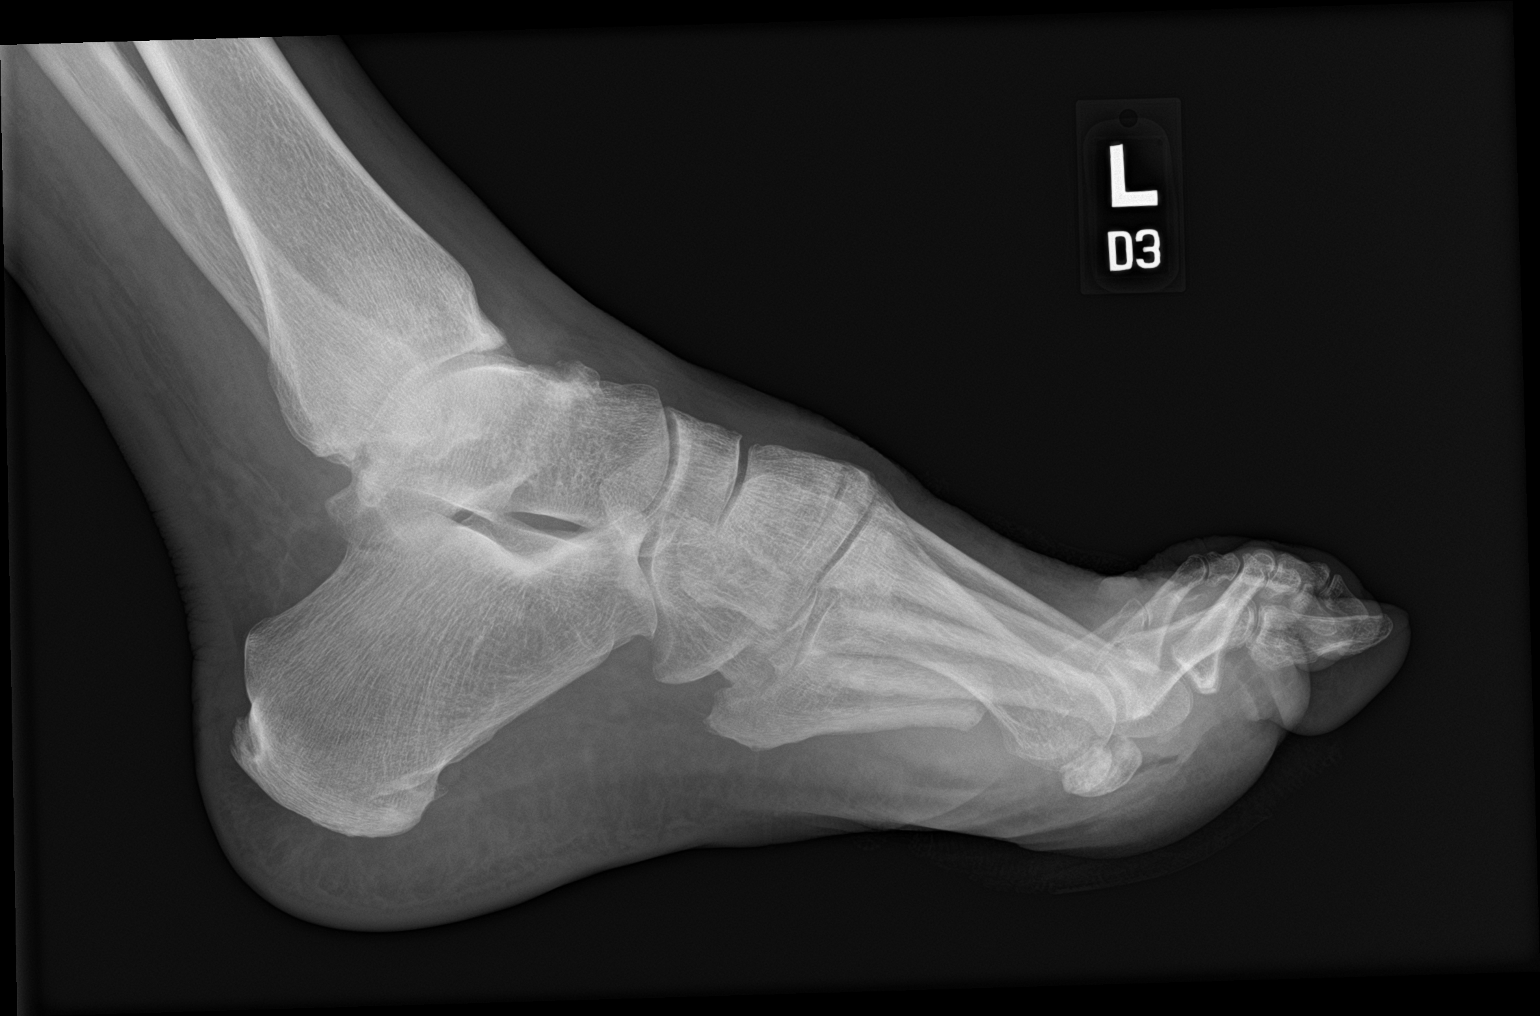

[3 of 3 positions shown; findings below may reference images not displayed]

FINDINGS: Prior fifth metatarsal amputation, the right toe remains in situ.
There section margin is smooth. Chronic fragmentation of the fourth
metatarsal head which is unchanged. No evidence of acute
osteomyelitis. No bony destruction. Small chronic periarticular
erosion involving the great toe proximal phalanx medial aspect.
Hallux valgus with degenerative change of the first metatarsal
phalangeal joint. Small plantar calcaneal spur and Achilles tendon
enthesophyte.

Site of ulcer is not well delineated by radiograph, may be about the
lateral forefoot. No tracking soft tissue air. No radiopaque foreign
body.
IMPRESSION: 1. No radiographic evidence of acute osteomyelitis.
2. Site of ulcer is not well delineated by radiograph, may be about
the lateral forefoot. No tracking soft tissue air or radiopaque
foreign body. Prior fifth metatarsal amputation with smooth
resection margins.
3. Hallux valgus with degenerative change of the first
metatarsophalangeal joint. Chronic bony fragmentation of the fourth
metatarsal head.

## 2021-09-19 ENCOUNTER — Ambulatory Visit: Payer: BLUE CROSS/BLUE SHIELD | Admitting: Podiatry

## 2021-10-15 ENCOUNTER — Ambulatory Visit: Payer: BLUE CROSS/BLUE SHIELD

## 2021-10-15 DIAGNOSIS — E1149 Type 2 diabetes mellitus with other diabetic neurological complication: Secondary | ICD-10-CM

## 2021-10-15 DIAGNOSIS — M216X9 Other acquired deformities of unspecified foot: Secondary | ICD-10-CM

## 2021-10-15 DIAGNOSIS — G600 Hereditary motor and sensory neuropathy: Secondary | ICD-10-CM

## 2021-10-15 DIAGNOSIS — S99912A Unspecified injury of left ankle, initial encounter: Secondary | ICD-10-CM

## 2021-10-15 NOTE — Progress Notes (Signed)
SITUATION ?Patient Name:  [name] ?MRN:   [id number] ?Reason for Visit: Evaluation for [facility] Gauntlet AFO ? ?Patient Report: ?Chief Complaint:   CMT related ankle instability ?Nature of Discomfort/Pain:  Ambulatory Standing Resting ?Location:    left lower extremity ?Onset & Duration:   Gradual and Present longer than 3 months ?Course:    gradually worsening ?Aggravating or Alleviating Factors: Ambulation ? ?OBJECTIVE DATA & MEASUREMENTS ?Prognosis:    Good ?Duration of use:   5 years ? ?Diagnosis: ?  ICD-10-CM   ?1. 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  ?2. 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  ?3. 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  ?4. Ankle injury, left, initial encounter  S99.912A   ?  ? ? ?GOALS, NECESSITIES, & JUSTIFICATIONS ?Recommended Device: [employer] ?Color:    Black ?Closure:   Laces ? ?Laterality HCPCS Code Description Justification  ?left [id number] Plastic orthosis, custom molded from a model of the patient, custom fabricated, includes casting and cast preparation. Necessary to provide triplanar support to the foot/ankle complex  ?left L2330 Addition to lower extremity, lacer molded to patient model Necessary to ensure secure hold of orthosis to patient's limb  ?left L2820 Addition to lower extremity orthosis, soft interface for molded plastic below knee section Necessary to relieve pressure on bony prominences  ? ? ?I certify that THALES KNIPPLE qualifies for and will benefit from an ankle foot orthosis used during ambulation based on meeting all of the following criteria;  ? ?The patient is: ?- Ambulatory, and ?- Has weakness or deformity of the foot and ankle, and ?- Requires stabilization for medical reasons, and ?- Has the potential to benefit functionally ? ?The patient?s medical record contains sufficient documentation of the patients medical condition to substantiate the necessity for the type and quantity of the items  ordered. ? ?The goals of this therapy: ?- Improve Mobility ?- Improve Lower Extremity Stability ?- Decrease Pain ?- Facilitate Soft Tissue Healing ?- Facilitate Immobilization, healing and treatment of an injury ? ?Necessity of Ankle Foot Orthotic molded to patient model: ?A custom (vs. prefabricated) ankle foot orthosis has been prescribed based on the following criteria which are specific to the condition of this patient; ?- The patient could not be fit with a prefabricated AFO ?- The condition necessitating the orthosis is expected to be permanent or of longstanding duration (more than 6 months) ?- There is need to control the ankle or foot in more than one plane ?- The patient has a documented neurological, circulatory, or orthopedic condition that requires custom fabrication over a model to prevent tissue injury ?- The patient has a healing fracture that lacks normal anatomical integrity or anthropometric proportions ? ?I hereby certify that the ankle foot orthotic described above is a rigid or semi-rigid device which is used for the purpose of supporting a weak or deformed body member or restricting or eliminating motion in a diseased or injured part of the body. It is designed to provide support and counterforce on the limb or body part that is being braced. In my opinion, the custom molded ankle foot orthosis is both reasonable and necessary in reference to accepted standards of medical practice in the treatment  ?of the patient condition and rehabilitation. ? ?ACTIONS PERFORMED ?Patient was evaluated and casted for [place] AFO via [employer]. Procedure was explained to patient. Patient tolerated procedure. patient selected device color and closure method.  ? ?  PLAN ?Patient to return in four to six weeks for fitting and delivery of device. Plan of care was explained to and agreed upon by patient. All questions were answered and concerns addressed. ? ? ? ? ? ?

## 2021-10-15 NOTE — Progress Notes (Signed)
SITUATION ?Reason for Visit: Dispensation and Fitting of Custom Molded Gauntlet ?Patient Report: Patient reports comfort in ambulation and understands all instructions. ? ?OBJECTIVE DATA ?Patient History / Diagnosis:   ?  ICD-10-CM   ?1. 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  ?2. 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  ?3. 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  ?4. Ankle injury, left, initial encounter  [phone]   ?  ? ? ?Provided Device:  Biomedical engineer Gauntlet: Style [place] gauntlet ? ?Goals of Orthosis: ?- Improve gait ?- Decrease energy expenditure during the gait cycle ?- Improve balance ?- Stabilize motion at ankle and subtalar joint ?- Compensate for muscle weakness ?- Facilitate motion ?- Provide triplanar ankle and foot stabilization for weight bearing activities ? ?Device Justification: ?- Patient is ambulatory  ?- Device is medically necessary as part of the overall treatment due to the patient's condition and related symptoms ?- It is anticipated that the patient will benefit functionally with use of the device.  ?- The custom device is utilized in an attempt to avoid the need for surgery and because a prefabricated device is inappropriate. ? ?Upon gait analysis, the device appeared to be fitting well and the patient states that the device is comfortable. ? ?ACTIONS PERFORMED ?Patient was fit with Architectural technologist. Patient tolerated fitting procedure. Fit of the device is good. Patient was able to apply properly and ambulate without distress. Device function is to restrict and limit motion and provide stabilization in the ankle joint.  ? ?Goals and function of this device were explained in detail to the patient. The patient was shown how to properly apply, wear, and care for the device. It was explained that the device will fit and function best in an adjustable-closure shoe with a firm heel counter and a wide base of support. When the device was  dispensed, it was suitable for the patient's condition and not substandard. No guarantees were given. Precautions were reviewed.  ? ?Written instructions, warranty information, and a copy of DMEPOS Supplier Standards were provided. All questions answered and concerns addressed. ? ?PLAN ?Patient is to follow up in one week or as necessary (PRN). Plan of care was discussed with and agreed upon by patient. ?  ?

## 2021-10-22 ENCOUNTER — Other Ambulatory Visit: Payer: Self-pay | Admitting: Podiatry

## 2021-10-22 ENCOUNTER — Telehealth: Payer: Self-pay | Admitting: Podiatry

## 2021-10-22 DIAGNOSIS — L97522 Non-pressure chronic ulcer of other part of left foot with fat layer exposed: Secondary | ICD-10-CM

## 2021-10-22 NOTE — Telephone Encounter (Signed)
Pt wanting a referral for a knee scooter.  Please advise

## 2021-11-06 ENCOUNTER — Ambulatory Visit (INDEPENDENT_AMBULATORY_CARE_PROVIDER_SITE_OTHER): Payer: BLUE CROSS/BLUE SHIELD

## 2021-11-06 DIAGNOSIS — G600 Hereditary motor and sensory neuropathy: Secondary | ICD-10-CM

## 2021-11-06 DIAGNOSIS — E1149 Type 2 diabetes mellitus with other diabetic neurological complication: Secondary | ICD-10-CM

## 2021-11-06 NOTE — Progress Notes (Signed)
SITUATION Reason for Visit: Fitting of Diabetic Shoes & Insoles Patient / Caregiver Report:  Patient is satisfied with fit and function of shoes and insoles.  OBJECTIVE DATA: Patient History / Diagnosis:     ICD-10-CM   1. 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 Patient wore shoes and insoles in office. Skin was inspected and free of areas of concern after wearing shoes and inserts. Shoes and inserts fit properly. Patient / Caregiver provided with ferbal instruction and demonstration regarding donning, doffing, wear, care, proper fit, function, purpose, cleaning, and use of shoes and insoles ' and in all related precautions and risks and benefits regarding shoes and insoles. Patient / Caregiver was instructed to wear properly fitting socks with shoes at all times. Patient was also provided with verbal instruction regarding how to report any failures or malfunctions of shoes or inserts, and necessary follow up care. Patient / Caregiver was also instructed to contact physician regarding change in status that may affect function of shoes and inserts.   Patient / Caregiver verbalized undersatnding of instruction provided. Patient / Caregiver demonstrated independence with proper donning and doffing of shoes and inserts.  PLAN Patient to follow with treating physician as recommended. Plan of care was discussed with and agreed upon by patient and/or caregiver. All questions were answered and concerns addressed.

## 2021-11-25 ENCOUNTER — Ambulatory Visit (INDEPENDENT_AMBULATORY_CARE_PROVIDER_SITE_OTHER): Payer: BLUE CROSS/BLUE SHIELD | Admitting: Podiatry

## 2021-11-25 DIAGNOSIS — G600 Hereditary motor and sensory neuropathy: Secondary | ICD-10-CM

## 2021-11-25 DIAGNOSIS — E1149 Type 2 diabetes mellitus with other diabetic neurological complication: Secondary | ICD-10-CM

## 2021-11-25 DIAGNOSIS — L97522 Non-pressure chronic ulcer of other part of left foot with fat layer exposed: Secondary | ICD-10-CM

## 2021-12-02 NOTE — Progress Notes (Signed)
Subjective: 56 year old male presents the office today for for evaluation of wounds on the bottom of the foot.  He states he is doing much better and he thinks to be further along but he has been active recently with some got married and is on his feet more.  He has been with the foot defender boot which has been helping but it did rub a sore on the lateral ankle.  He is still followed at the wound care center.  He presents today more asking about long-term treatment and physical therapy.  Denies any fevers or chills.  No purulence that he reports.  No other concerns.   Objective: AAO x3, NAD DP/PT pulses palpable bilaterally, CRT less than 3 seconds Small ulcer still noted along the left foot fifth metatarsal base with a granular wound.  This appears to be almost healed.  There is a new wound present lateral malleolus with superficial granular base.  Rim of erythema without any ascending cellulitis.  No fluctuance or crepitation.  No malodor.  Cavus foot type present. No pain with calf compression, swelling, warmth, erythema   Assessment: Ulceration left foot  Plan: -All treatment options discussed with the patient including all alternatives, risks, complications.  -Continue with the wound care center in regards to the treatment of the wounds.  Today presents more asking about long-term care.  Discussed bracing long-term and also physical therapy.  I would hold off on physical therapy to the wounds heal.  Continue monitoring signs or symptoms of infection.  Vivi Barrack DPM

## 2021-12-12 ENCOUNTER — Telehealth: Payer: Self-pay | Admitting: Podiatry

## 2021-12-12 NOTE — Telephone Encounter (Signed)
Pt would like a referral to [facility].  Please advise

## 2021-12-26 ENCOUNTER — Ambulatory Visit (INDEPENDENT_AMBULATORY_CARE_PROVIDER_SITE_OTHER): Payer: BLUE CROSS/BLUE SHIELD | Admitting: Podiatry

## 2021-12-26 DIAGNOSIS — L03116 Cellulitis of left lower limb: Secondary | ICD-10-CM

## 2021-12-26 DIAGNOSIS — L97522 Non-pressure chronic ulcer of other part of left foot with fat layer exposed: Secondary | ICD-10-CM | POA: Diagnosis not present

## 2021-12-26 MED ORDER — DOXYCYCLINE HYCLATE 100 MG PO TABS: 100.0000 mg | ORAL_TABLET | Freq: Two times a day (BID) | ORAL | 0 refills | Status: DC

## 2021-12-26 NOTE — Progress Notes (Signed)
[facility] F/u PT  Subjective: Chief Complaint  Patient presents with   Diabetic Ulcer    A1c- 7.6, left ankle ulcer doing well    56 year old male presents with above complaints.  He states the ulcerations are doing well.  Still seen at the wound care center.  No fevers or chills and reports.  He does have an [place] brace today.  He thinks that the brace is rubbing right along the area of the wound so he has stopped using this.  He feels that wearing shoes and arch support is helping support his foot better.  He feels that the range of motion of his ankle is doing better as well.  Objective: AAO x3, NAD DP/PT pulses palpable bilaterally, CRT less than 3 seconds Granular ulceration noted along the malleolus.  Superficial wound along the fifth metatarsal base area.  There is some mild erythema.  There is no fluctuance or crepitation.  No odor.  There is no probing, undermining or tunneling. No pain with calf compression, swelling, warmth, erythema       Assessment: Ulcerations with improvement however localized erythema  Plan: -All treatment options discussed with the patient including all alternatives, risks, complications.  -Continue wound care per the wound care center.  I did prescribe doxycycline given mild erythema.  Hold off on [place] brace for now.  Continue shoes with good arch support. -Monitor for any clinical signs or symptoms of infection and directed to call the office immediately should any occur or go to the ER. -Patient encouraged to call the office with any questions, concerns, change in symptoms.   Vivi Barrack DPM

## 2022-01-21 ENCOUNTER — Telehealth: Payer: Self-pay | Admitting: Podiatry

## 2022-01-21 NOTE — Telephone Encounter (Signed)
Pt is requesting to come in to see someone for a brace adjustment. He states he keeps twisting his foot and it needs to be adjusted if possible.  Please advise

## 2022-01-28 ENCOUNTER — Telehealth: Payer: Self-pay | Admitting: Podiatry

## 2022-01-28 ENCOUNTER — Ambulatory Visit: Payer: BLUE CROSS/BLUE SHIELD | Admitting: Podiatry

## 2022-01-28 NOTE — Telephone Encounter (Signed)
Patient called and stated that he had an amputation and that his brace needs to be adjusted. I spoke to [name] and she stated that he would need a prescription so that he can go over to the [facility].  Please advise

## 2022-01-29 ENCOUNTER — Ambulatory Visit (INDEPENDENT_AMBULATORY_CARE_PROVIDER_SITE_OTHER): Payer: BLUE CROSS/BLUE SHIELD | Admitting: Podiatry

## 2022-01-29 ENCOUNTER — Ambulatory Visit (INDEPENDENT_AMBULATORY_CARE_PROVIDER_SITE_OTHER): Payer: BLUE CROSS/BLUE SHIELD

## 2022-01-29 DIAGNOSIS — G600 Hereditary motor and sensory neuropathy: Secondary | ICD-10-CM | POA: Diagnosis not present

## 2022-01-29 DIAGNOSIS — L97522 Non-pressure chronic ulcer of other part of left foot with fat layer exposed: Secondary | ICD-10-CM

## 2022-01-29 DIAGNOSIS — M79672 Pain in left foot: Secondary | ICD-10-CM

## 2022-01-31 ENCOUNTER — Ambulatory Visit (INDEPENDENT_AMBULATORY_CARE_PROVIDER_SITE_OTHER): Payer: BLUE CROSS/BLUE SHIELD | Admitting: *Deleted

## 2022-01-31 DIAGNOSIS — G600 Hereditary motor and sensory neuropathy: Secondary | ICD-10-CM

## 2022-01-31 DIAGNOSIS — M216X9 Other acquired deformities of unspecified foot: Secondary | ICD-10-CM

## 2022-01-31 DIAGNOSIS — L97522 Non-pressure chronic ulcer of other part of left foot with fat layer exposed: Secondary | ICD-10-CM

## 2022-01-31 DIAGNOSIS — S99912A Unspecified injury of left ankle, initial encounter: Secondary | ICD-10-CM

## 2022-01-31 NOTE — Progress Notes (Signed)
Patient presents for a AFO brace adjustment.  Patient has some foot deformities due to ulcerations and amputations.   He states now that he is wearing his brace, his foot is leaning outward in his shoe.   On evaluation, it appears there is a void of space causing this. I cut out some felt pads and placed them on his diabetic shoe insole.   He walked around in the room and states that it did feel better, but he would try it out better at home.   He will let us know if this did not help.

## 2022-02-03 NOTE — Progress Notes (Signed)
Subjective: Chief Complaint  Patient presents with   Foot Problem    Lt foot, follow ups on left foot with wound clinic, rehab has begun last week      56 year old male presents with above complaints.  Physical wounds are doing about the same.  He has started physical therapy.  Not seeing any purulence coming from the ulcers.  He did not bring his brace for me to evaluate today.  States he is new job and he is on his feet more.  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  Objective: AAO x3, NAD DP/PT pulses palpable bilaterally, CRT less than 3 seconds Granular ulceration noted along the malleolus.  Superficial wound along the fifth metatarsal base area.  There was quite a bit of dried callus with some dried blood around this but after debridement the wound is granular without any probing, amount or tunneling.  No surrounding erythema, ascending cellulitis.  There is no fluctuation or crepitation.  No malodor. No pain with calf compression, swelling, warmth, erythema   Assessment: Ulcerations left foot Plan: -All treatment options discussed with the patient including all alternatives, risks, complications.  -Sharply debrided the wound today utilizing #312 with scalpel any complications and healthy, granular tissue.  There was no significant blood loss.  Area skin with saline and Silvadene was applied followed by dressing.  Continue daily dressing changes. -Follow-up for possible brace modifications -Monitor for any clinical signs or symptoms of infection and directed to call the office immediately should any occur or go to the ER.  Vivi Barrack DPM

## 2022-02-24 IMAGING — CT CT ABD-PELV W/ CM
2 of 6 series · 16 of 46 positions shown, 18 images · IV contrast (Omni 300)
Comparison: 12/24/2010, 10/16/2010

CLINICAL DATA: Acute nonlocalized, diffuse abdominal pain,
vomiting, diaphoresis

EXAM:
CT ABDOMEN AND PELVIS WITH CONTRAST
TECHNIQUE: Multidetector CT imaging of the abdomen and pelvis was performed
using the standard protocol following bolus administration of
intravenous contrast.
CONTRAST:  80mL OMNIPAQUE IOHEXOL 350 MG/ML SOLN

[Series 3: a/p w/ 5mm · axial · 0.96mm/px · z∈[-1235,-760]mm · 13 of 109 slices shown, 15 images]
[im 7/109  soft-tissue]
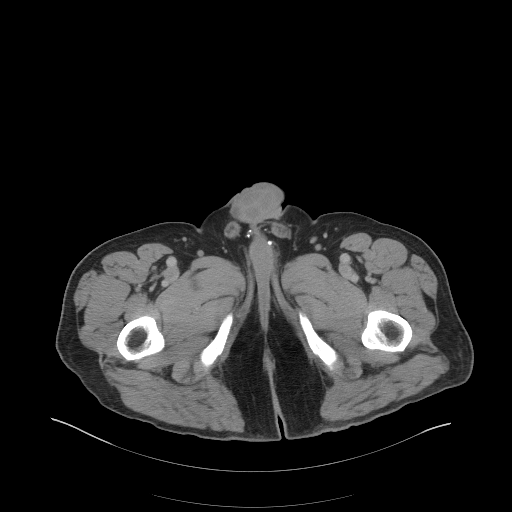
[im 7/109  bone]
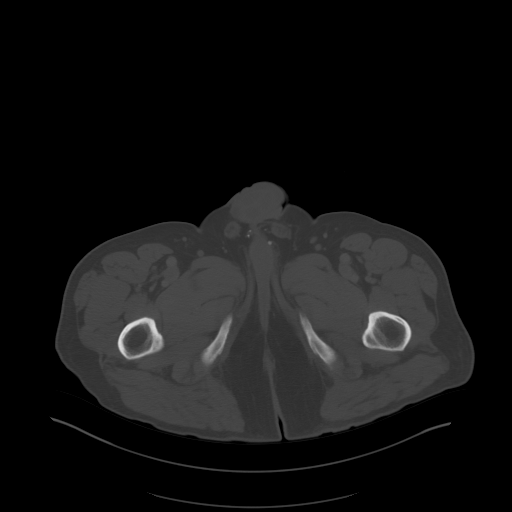
[im 13/109  soft-tissue]
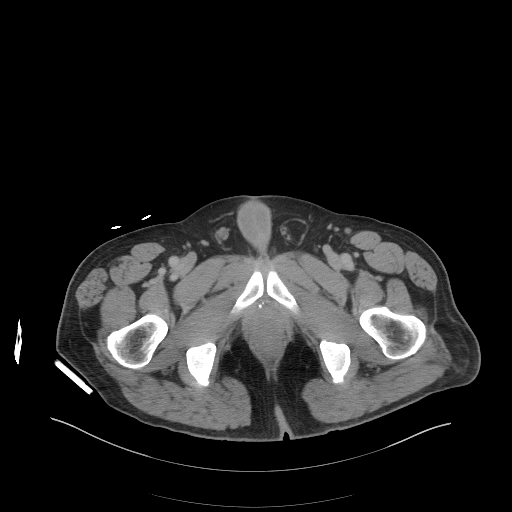
[im 26/109  soft-tissue]
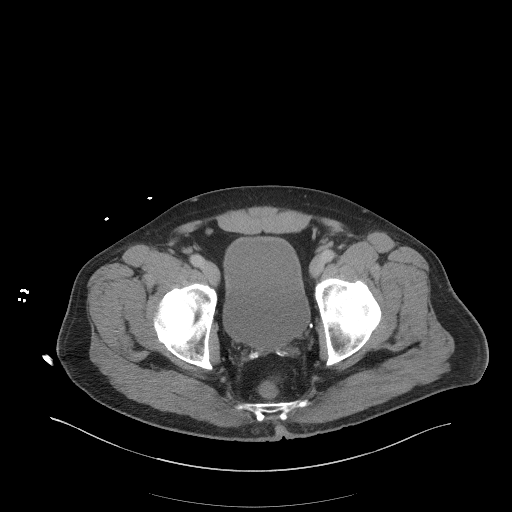
[im 32/109  soft-tissue]
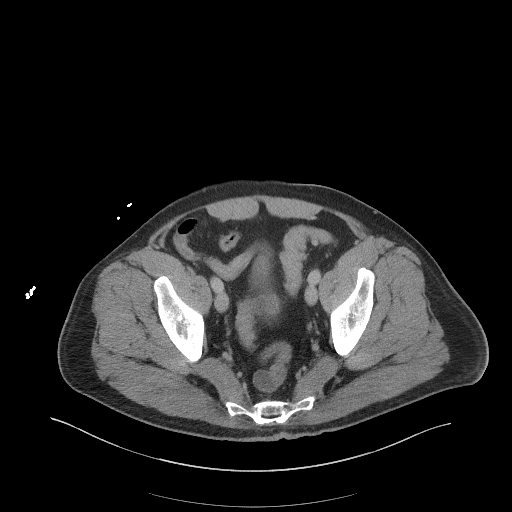
[im 39/109  soft-tissue]
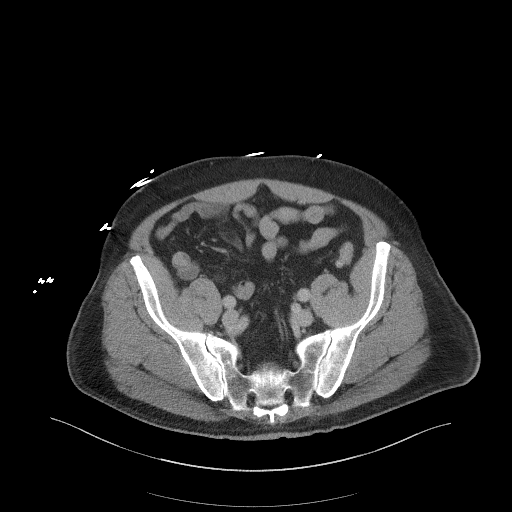
[im 45/109  soft-tissue]
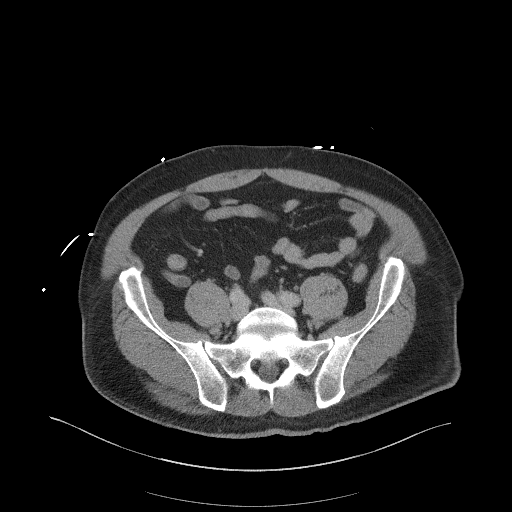
[im 58/109  soft-tissue]
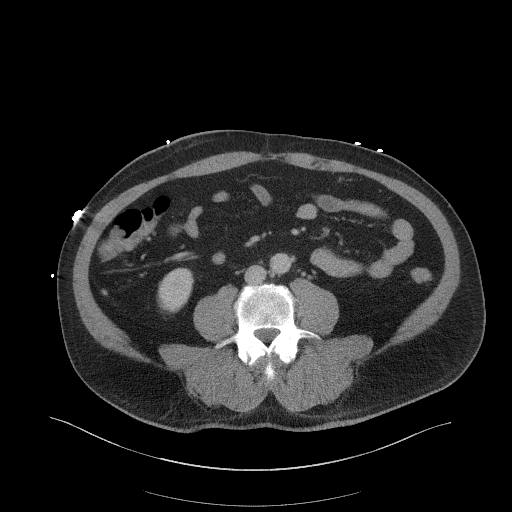
[im 64/109  soft-tissue]
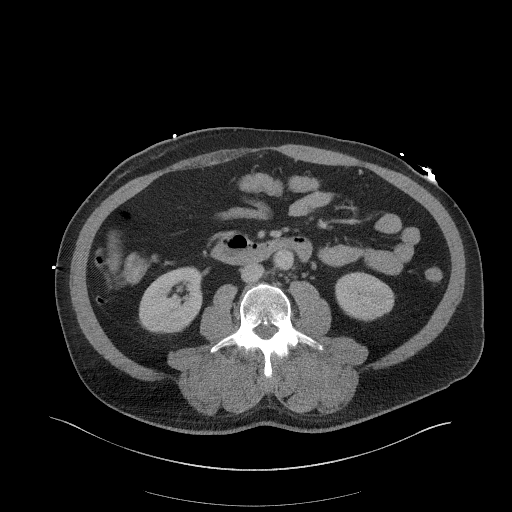
[im 70/109  soft-tissue]
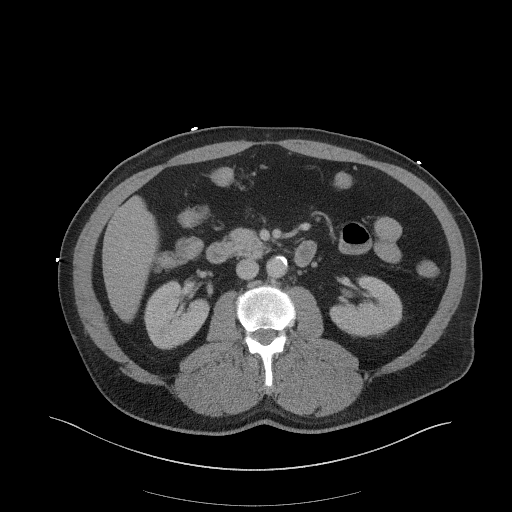
[im 70/109  bone]
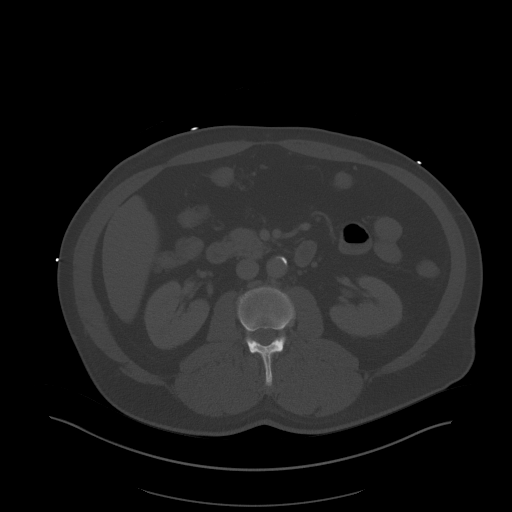
[im 77/109  soft-tissue]
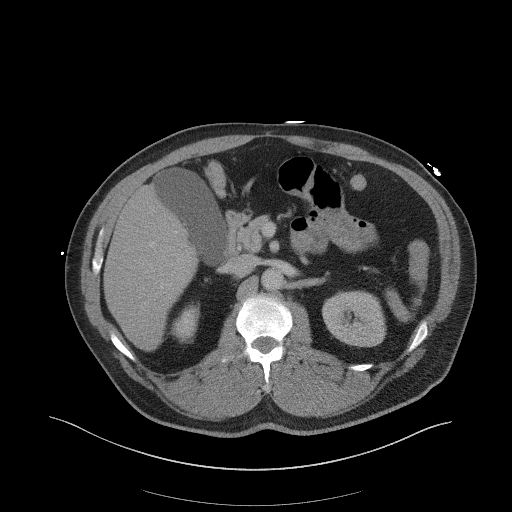
[im 83/109  soft-tissue]
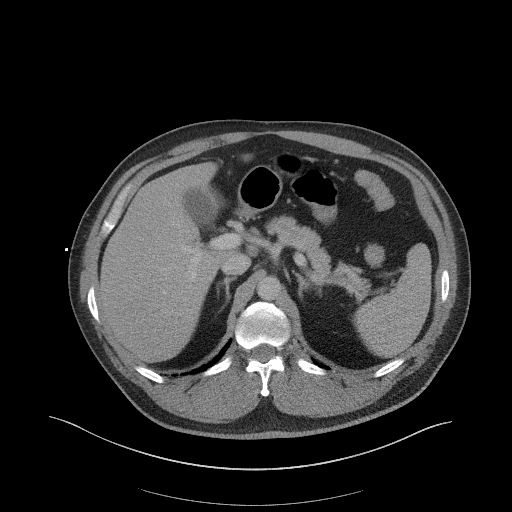
[im 96/109  soft-tissue]
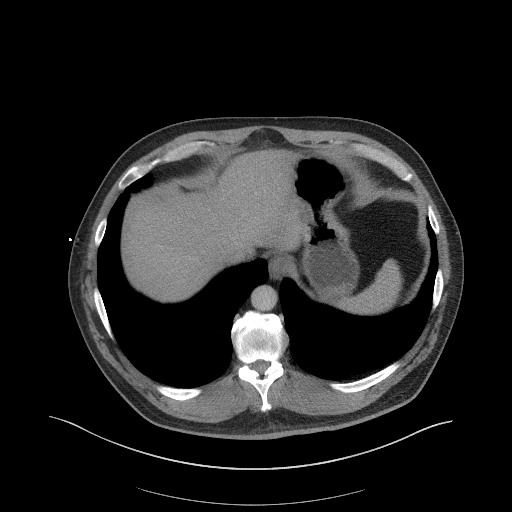
[im 102/109  soft-tissue]
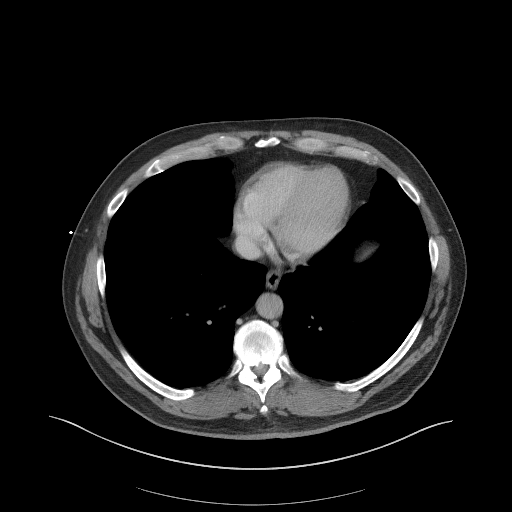

[Series 6: a/p w/ cor · coronal · 0.94mm/px · 3 of 130 slices shown]
[im 44/130  soft-tissue]
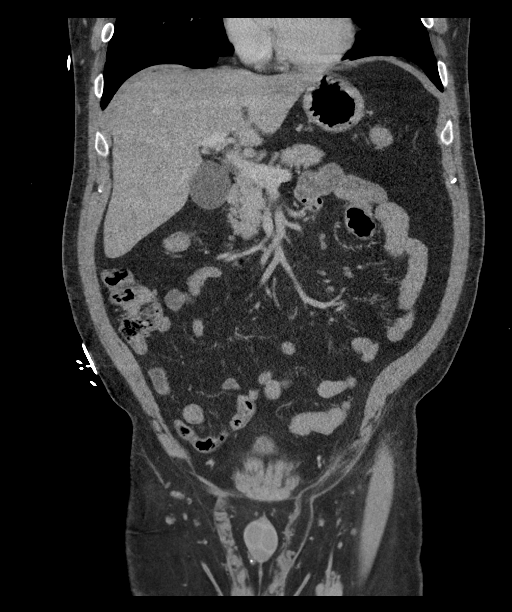
[im 58/130  soft-tissue]
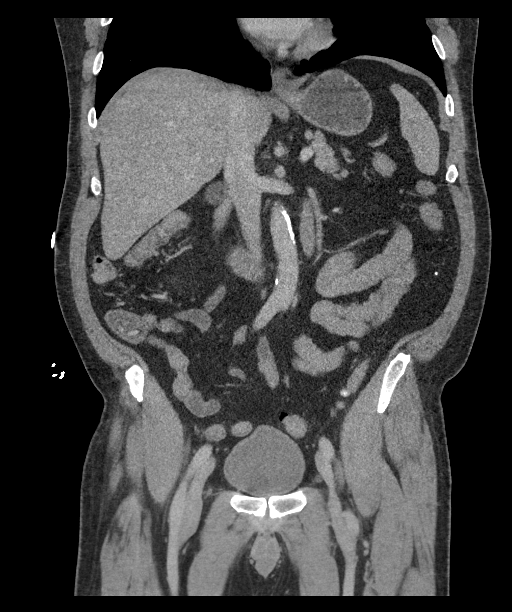
[im 72/130  soft-tissue]
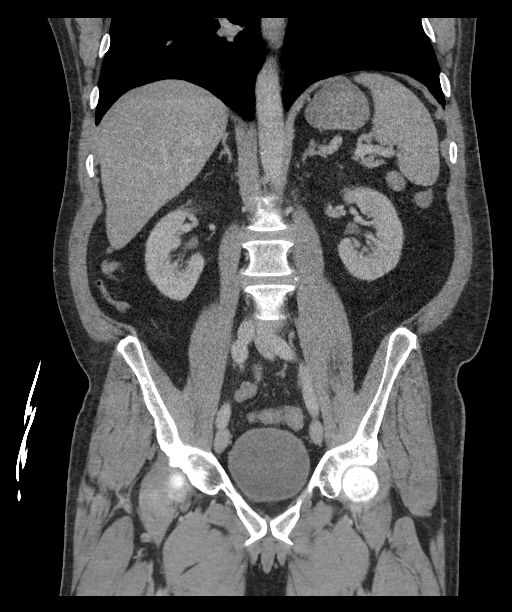

[16 of 46 positions shown; findings below may reference images not displayed]

FINDINGS: Lower chest: No acute abnormality.

Hepatobiliary: No focal liver abnormality is seen. No gallstones,
gallbladder wall thickening, or biliary dilatation.

Pancreas: Unremarkable

Spleen: Unremarkable

Adrenals/Urinary Tract: Adrenal glands are unremarkable. Kidneys are
normal, without renal calculi, focal lesion, or hydronephrosis.
Bladder is unremarkable.

Stomach/Bowel: Mild descending and sigmoid colonic diverticulosis.
The stomach, small bowel, and large bowel are otherwise
unremarkable. Appendix normal. No evidence of obstruction or focal
inflammation. No free intraperitoneal gas or fluid.

Vascular/Lymphatic: Mild atherosclerotic calcification within the
abdominal aorta. No aortic aneurysm. No pathologic adenopathy within
the abdomen and pelvis.

Reproductive: Prostate is unremarkable.

Other: Tiny fat containing left inguinal hernia.

Musculoskeletal: No acute bone abnormality. Degenerative changes are
seen within the lumbar spine. No lytic or blastic bone lesions are
identified.
IMPRESSION: No acute intra-abdominal pathology identified. No definite
radiographic explanation for the patient's reported symptoms.

Mild distal colonic diverticulosis without superimposed inflammatory
change.

Aortic Atherosclerosis ([id number]).

## 2022-02-28 ENCOUNTER — Ambulatory Visit (HOSPITAL_COMMUNITY)
Admission: EM | Admit: 2022-02-28 | Discharge: 2022-02-28 | Disposition: A | Discharge status: Home / Self Care | Payer: BLUE CROSS/BLUE SHIELD | Attending: Nurse Practitioner | Admitting: Nurse Practitioner

## 2022-02-28 ENCOUNTER — Encounter (HOSPITAL_COMMUNITY): Payer: Self-pay

## 2022-02-28 ENCOUNTER — Ambulatory Visit (INDEPENDENT_AMBULATORY_CARE_PROVIDER_SITE_OTHER): Payer: BLUE CROSS/BLUE SHIELD | Admitting: Podiatry

## 2022-02-28 DIAGNOSIS — L97522 Non-pressure chronic ulcer of other part of left foot with fat layer exposed: Secondary | ICD-10-CM

## 2022-02-28 DIAGNOSIS — Z23 Encounter for immunization: Secondary | ICD-10-CM

## 2022-02-28 DIAGNOSIS — S0181XA Laceration without foreign body of other part of head, initial encounter: Secondary | ICD-10-CM | POA: Diagnosis not present

## 2022-02-28 DIAGNOSIS — S0990XA Unspecified injury of head, initial encounter: Secondary | ICD-10-CM | POA: Diagnosis not present

## 2022-02-28 MED ORDER — KETOROLAC TROMETHAMINE 60 MG/2ML IM SOLN
60.0000 mg | Freq: Once | INTRAMUSCULAR | Status: AC
  Administered 2022-02-28: 60 mg via INTRAMUSCULAR

## 2022-02-28 MED ORDER — TETANUS-DIPHTH-ACELL PERTUSSIS 5-2.5-18.5 LF-MCG/0.5 IM SUSY
0.5000 mL | PREFILLED_SYRINGE | Freq: Once | INTRAMUSCULAR | Status: AC
  Administered 2022-02-28: 0.5 mL via INTRAMUSCULAR

## 2022-02-28 MED ORDER — KETOROLAC TROMETHAMINE 60 MG/2ML IM SOLN
INTRAMUSCULAR | Status: AC
  Filled 2022-02-28: qty 2

## 2022-02-28 MED ORDER — KETOROLAC TROMETHAMINE 10 MG PO TABS: 10.0000 mg | ORAL_TABLET | Freq: Four times a day (QID) | ORAL | 0 refills | Status: DC | PRN

## 2022-02-28 MED ORDER — TETANUS-DIPHTH-ACELL PERTUSSIS 5-2.5-18.5 LF-MCG/0.5 IM SUSY
PREFILLED_SYRINGE | INTRAMUSCULAR | Status: AC
  Filled 2022-02-28: qty 0.5

## 2022-02-28 MED ORDER — TETANUS-DIPHTHERIA TOXOIDS TD 5-2 LFU IM INJ: 0.5000 mL | INJECTION | Freq: Once | INTRAMUSCULAR | Status: DC

## 2022-02-28 NOTE — ED Provider Notes (Signed)
[facility]    CSN: [id number] Arrival date & time: 02/28/22  1617      History   Chief Complaint Chief Complaint  Patient presents with   Head Injury   Laceration    HPI [name] is a 56 y.o. male.    Subjective:  [name] is a 56 y.o. male who presents for evaluation of a laceration to the forehead after sustaining a head injury. The patient complains of right sided head pain and headache. Onset of symptoms was abrupt starting about an hour prior to arrival. Patient reports that he tripped and accidentally hit head against the range above his stove. Loss of consciousness did not occur. Pain is described as throbbing. Severity of symptoms now is 7 /10. Symptoms have been constant. Symptoms are aggravated by bending over and moving head. It is alleviated some by rest. He has not tried to take anything for pain prior to arrival. Patient denies amnesia, confusion, nausea, vomiting, dizziness, seizure, tingling, neck pain, numbness, or weakness. The tetanus status is more than 10 years ago.  The following portions of the patient's history were reviewed and updated as appropriate: allergies, current medications, past family history, past medical history, past social history, past surgical history, and problem list.      Past Medical History:  Diagnosis Date   ADHD (attention deficit hyperactivity disorder)    Anxiety and depression    Candida esophagitis ([place])    in setting of DKA/hyperglycemia   CIDP (chronic inflammatory demyelinating polyneuropathy) ([place])    Depression    Diabetes type 2, controlled ([place])    Diverticulosis    Essential hypertension 06/21/2014   Hyperlipidemia    Kidney stones    Pancreatitis    Stomach ulcer    Vitamin D deficiency     Patient Active Problem List   Diagnosis Date Noted   Foot ulcer with fat layer exposed, left ([place]) 11/15/2019   Displaced fracture of shaft of left clavicle, initial encounter for closed  fracture 03/15/2019   Dupuytren's contracture 07/29/2018   PICC (peripherally inserted central catheter) in place 05/24/2018   Neuropathy 05/12/2018   Osteomyelitis of ankle or foot, acute, left ([place]) 05/05/2018   Diabetic ulcer of left midfoot associated with type 2 diabetes mellitus, limited to breakdown of skin ([place]) 12/18/2017   Chronic pain syndrome 11/03/2017   Gastroesophageal reflux disease with esophagitis without hemorrhage 09/17/2017   Esophageal dysphagia    DKA, type 2 ([place]) 07/15/2017   ADD (attention deficit disorder) 07/15/2017   DKA (diabetic ketoacidoses) 07/15/2017   Vitamin B12 deficiency (non anemic) 08/13/2016   Adhesive capsulitis of left shoulder 01/18/2016   Trigger ring finger of right hand 10/11/2015   Primary osteoarthritis of first carpometacarpal joint of left hand 06/14/2015   Radial styloid tenosynovitis 06/14/2015   Depression 10/05/2014   Noncompliance with diabetes treatment 10/05/2014   Vitamin D deficiency 09/14/2014   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   Diabetic peripheral neuropathy associated with type 2 diabetes mellitus ([place]) 06/21/2014   Essential hypertension 06/21/2014   Erectile dysfunction associated with type 2 diabetes mellitus ([place]) 06/21/2014   Long-term current use of insulin for diabetes mellitus ([place]) 06/21/2014   Adhesive capsulitis of right shoulder 03/24/2011   Retinal detachment 12/06/2010   Rotator cuff syndrome 12/06/2010   History of pancreatitis 10/22/2010   Hypertriglyceridemia 10/22/2010   Diabetes mellitus type II, uncontrolled 10/22/2010    Past Surgical History:  Procedure Laterality Date   ANKLE SURGERY  CERVICAL DISC SURGERY     C4-C5   ELBOW SURGERY     ESOPHAGOGASTRODUODENOSCOPY N/A 07/16/2017   Procedure: ESOPHAGOGASTRODUODENOSCOPY (EGD);  Surgeon: Iva BoopGessner, Carl E, MD;  [facility];  Service: Endoscopy;  Laterality: N/A;   RETINAL DETACHMENT SURGERY     RHINOPLASTY     VASECTOMY      WOUND DEBRIDEMENT Left 03/27/2018   Procedure: DEBRIDEMENT WOUND;  Surgeon: Vivi BarrackWagoner, Matthew R, DPM;  Location: [facility] OR;  Service: Podiatry;  Laterality: Left;       Home Medications    Prior to Admission medications   Medication Sig Start Date End Date Taking? Authorizing Provider  Amphetamine Sulfate 10 MG TABS Take 20 mg by mouth 2 (two) times daily.    Yes [provider]  atorvastatin (LIPITOR) 10 MG tablet Take 10 mg by mouth daily. 04/03/20  Yes [provider]  buPROPion (WELLBUTRIN XL) 150 MG 24 hr tablet Take 150 mg by mouth daily. 04/01/18  Yes [provider]  calcium carbonate (TUMS - DOSED IN MG ELEMENTAL CALCIUM) 500 MG chewable tablet Chew 1 tablet by mouth daily as needed for indigestion.   Yes [provider]  DULoxetine (CYMBALTA) 60 MG capsule Take 60 mg by mouth daily. 02/26/18  Yes [provider]  ergocalciferol (VITAMIN D2) 1.25 MG (50000 UT) capsule Take 50,000 Units by mouth once a week. 08/13/16  Yes [provider]  fenofibrate 160 MG tablet Take 160 mg by mouth daily.   Yes [provider]  gabapentin (NEURONTIN) 800 MG tablet Take 800 mg by mouth 3 (three) times daily.   Yes [provider]  insulin aspart (NOVOLOG) 100 UNIT/ML FlexPen Inject 22-32 Units into the skin See admin instructions. Inject 22-32 units into the skin three to four times a day before meals, per sliding scale   Yes [provider]  insulin NPH-regular Human (NOVOLIN 70/30) (70-30) 100 UNIT/ML injection Insulin NPH 40 units in am and 30 Units at night time. 07/16/17  Yes Barnetta Chapelgbata, Sylvester I, MD  ketorolac (TORADOL) 10 MG tablet Take 1 tablet (10 mg total) by mouth every 6 (six) hours as needed for moderate pain. 02/28/22  Yes Lurline IdolMurrill, Kipper Buch, FNP  lamoTRIgine (LAMICTAL) 150 MG tablet Take 150 mg by mouth 2 (two) times daily. 03/02/20  Yes [provider]  ramipril (ALTACE) 2.5 MG capsule Take 2.5 mg by  mouth daily. 05/06/20  Yes [provider]  SEMGLEE, YFGN, 100 UNIT/ML Pen Inject 80 Units into the skin at bedtime. 01/07/21  Yes [provider]  zolpidem (AMBIEN CR) 6.25 MG CR tablet Take 6.25 mg by mouth at bedtime. 02/03/21  Yes [provider]  acetaminophen (TYLENOL) 325 MG tablet Take 650 mg by mouth every 6 (six) hours as needed for mild pain or headache.    [provider]  aspirin 81 MG tablet Take 1 tablet (81 mg total) by mouth daily. 07/17/17   Barnetta Chapelgbata, Sylvester I, MD  Continuous Blood Gluc Receiver (FREESTYLE LIBRE 14 DAY READER) DEVI every 14 (fourteen) days.  01/05/18   [provider]  Continuous Blood Gluc Sensor (FREESTYLE LIBRE 14 DAY SENSOR) MISC  04/14/18   [provider]  dicyclomine (BENTYL) 20 MG tablet Take 1 tablet (20 mg total) by mouth 2 (two) times daily. 02/05/21   Garlon HatchetSanders, Lisa M, PA-C  doxycycline (VIBRA-TABS) 100 MG tablet Take 1 tablet (100 mg total) by mouth 2 (two) times daily. 12/26/21   Vivi BarrackWagoner, Matthew R, DPM  fluconazole (  DIFLUCAN) 100 MG tablet Take 2 tablets by mouth on day 1 then take 1 tablet daily until gone 07/22/17   [name], MD  gentamicin cream (GARAMYCIN) 0.1 % Apply 1 application topically in the morning and at bedtime. 11/16/20   Vivi Barrack, DPM  HYDROcodone-acetaminophen (NORCO/VICODIN) 5-325 MG tablet Take 1-2 tablets by mouth every 6 (six) hours as needed. 01/23/21   Vivi Barrack, DPM  ondansetron (ZOFRAN ODT) 4 MG disintegrating tablet Take 1 tablet (4 mg total) by mouth every 8 (eight) hours as needed for nausea. 02/05/21   Garlon Hatchet, PA-C  ondansetron (ZOFRAN) 4 MG tablet Take 1 tablet (4 mg total) by mouth every 8 (eight) hours as needed for nausea or vomiting. 01/23/21   Vivi Barrack, DPM  pantoprazole (PROTONIX) 40 MG tablet Take 1 tablet (40 mg total) by mouth daily before breakfast. 07/17/17   Barnetta Chapel, MD  promethazine (PHENERGAN) 25 MG tablet Take 1  tablet (25 mg total) by mouth every 8 (eight) hours as needed for nausea or vomiting. 01/11/20   Vivi Barrack, DPM  sildenafil (REVATIO) 20 MG tablet Take 60 mg by mouth daily as needed (ED). 08/20/18   [provider]  silver sulfADIAZINE (SILVADENE) 1 % cream Apply 1 application topically daily. 01/06/20   Vivi Barrack, DPM  silver sulfADIAZINE (SILVADENE) 1 % cream Apply 1 application topically daily. 06/25/20   Vivi Barrack, DPM  sulfamethoxazole-trimethoprim (BACTRIM DS) 800-160 MG tablet Take 1 tablet by mouth 2 (two) times daily. 12/24/20   Vivi Barrack, DPM  TECHLITE PEN NEEDLES 31G X 8 MM MISC USE TO GIVE INSULIN 4 TIMES DAILY 09/01/19   [provider]    Family History Family History  Problem Relation Age of Onset   Stroke Father    Breast cancer Mother    Colon cancer Neg Hx     Social History Social History   Tobacco Use   Smoking status: Former   Smokeless tobacco: Former  Building services engineer Use: Never used  Substance Use Topics   Alcohol use: Yes    Comment: Socially   Drug use: No     Allergies   Lithium, Adderall [amphetamine-dextroamphetamine], and Ritalin [methylphenidate]   Review of Systems Review of Systems  Eyes:  Positive for visual disturbance.  Gastrointestinal:  Negative for nausea and vomiting.  Musculoskeletal:  Negative for neck pain.  Skin:  Positive for wound.  Neurological:  Positive for headaches. Negative for dizziness, weakness and numbness.  All other systems reviewed and are negative.    Physical Exam Triage Vital Signs ED Triage Vitals  Enc Vitals Group     BP 02/28/22 1642 (!) 164/90     Pulse Rate 02/28/22 1642 72     Resp 02/28/22 1642 16     Temp 02/28/22 1642 98.8 F (37.1 C)     Temp Source 02/28/22 1642 Oral     SpO2 02/28/22 1642 96 %     Weight --      Height --      Head Circumference --      Peak Flow --      Pain Score 02/28/22 1644 7     Pain Loc --      Pain Edu? --       Excl. in GC? --    No data found.  Updated Vital Signs BP (!) 164/90 (BP Location: Right Arm)   Pulse 72  Temp 98.8 F (37.1 C) (Oral)   Resp 16   SpO2 96%   Visual Acuity Right Eye Distance: 20/40 Left Eye Distance: 20/40 Bilateral Distance: 20/40  Right Eye Near:   Left Eye Near:    Bilateral Near:     Physical Exam Vitals reviewed.  Constitutional:      General: He is not in acute distress.    Appearance: Normal appearance. He is not ill-appearing, toxic-appearing or diaphoretic.  HENT:     Head: Normocephalic. Laceration present. No right periorbital erythema.     Right Ear: No drainage.     Left Ear: No drainage.     Nose: Nose normal. No signs of injury or nasal tenderness.     Mouth/Throat:     Mouth: Mucous membranes are moist.  Eyes:     General: Vision grossly intact. Gaze aligned appropriately. No visual field deficit.    Extraocular Movements: Extraocular movements intact.     Right eye: No nystagmus.     Left eye: No nystagmus.     Conjunctiva/sclera: Conjunctivae normal.     Pupils: Pupils are equal, round, and reactive to light.  Cardiovascular:     Rate and Rhythm: Normal rate.  Pulmonary:     Effort: Pulmonary effort is normal.  Musculoskeletal:        General: Normal range of motion.     Cervical back: Normal range of motion and neck supple. No rigidity or tenderness.  Skin:    General: Skin is warm and dry.     Findings: Laceration present.  Neurological:     General: No focal deficit present.     Mental Status: He is alert and oriented to person, place, and time.     GCS: GCS eye subscore is 4. GCS verbal subscore is 5. GCS motor subscore is 6.     Cranial Nerves: Cranial nerves 2-12 are intact. No cranial nerve deficit.     Sensory: Sensation is intact. No sensory deficit.     Motor: Motor function is intact.     Coordination: Coordination is intact.     Gait: Gait is intact.  Psychiatric:        Mood and Affect: Mood normal.         Behavior: Behavior normal.        Thought Content: Thought content normal.        Judgment: Judgment normal.      UC Treatments / Results  Labs (all labs ordered are listed, but only abnormal results are displayed) Labs Reviewed - No data to display  EKG   Radiology No results found.  Procedures Laceration Repair  Date/Time: 02/28/2022 5:17 PM  Performed by: Lurline Idol, FNP Authorized by: Lurline Idol, FNP   Consent:    Consent obtained:  Verbal   Consent given by:  Patient   Risks, benefits, and alternatives were discussed: yes     Risks discussed:  Infection, pain, poor cosmetic result and poor wound healing Universal protocol:    Patient identity confirmed:  Arm band and verbally with patient Anesthesia:    Anesthesia method:  None Laceration details:    Location:  Face   Face location:  Forehead Exploration:    Hemostasis achieved with:  Direct pressure   Imaging outcome: foreign body not noted     Contaminated: no   Treatment:    Area cleansed with:  Saline   Amount of cleaning:  Standard   Debridement:  None   Undermining:  None Skin repair:    Repair method:  Tissue adhesive Approximation:    Approximation:  Close Repair type:    Repair type:  Simple Post-procedure details:    Dressing:  Non-adherent dressing   Procedure completion:  Tolerated well, no immediate complications  (including critical care time)  Medications Ordered in UC Medications  ketorolac (TORADOL) injection 60 mg (60 mg Intramuscular Given 02/28/22 1711)  Tdap (BOOSTRIX) injection 0.5 mL (0.5 mLs Intramuscular Given 02/28/22 1718)    Initial Impression / Assessment and Plan / UC Course  I have reviewed the triage vital signs and the nursing notes.  Pertinent labs & imaging results that were available during my care of the patient were reviewed by me and considered in my medical decision making (see chart for details).     56 year old male presenting with  forehead laceration and acute closed head injury.  Is alert and oriented x3.  No focal deficits noted on exam.  Wound repaired with Dermabond.  Tdap updated.  Patient initially had a severe headache which has improved significantly with Toradol.  Wound care and monitoring parameters and indications for follow-up extensively discussed with the patient  Today's evaluation has revealed no signs of a dangerous process. Discussed diagnosis with patient and/or guardian. Patient and/or guardian aware of their diagnosis, possible red flag symptoms to watch out for and need for close follow up. Patient and/or guardian understands verbal and written discharge instructions. Patient and/or guardian comfortable with plan and disposition.  Patient and/or guardian has a clear mental status at this time, good insight into illness (after discussion and teaching) and has clear judgment to make decisions regarding their care  Documentation was completed with the aid of voice recognition software. Transcription may contain typographical errors. Final Clinical Impressions(s) / UC Diagnoses   Final diagnoses:  Laceration of forehead, initial encounter  Closed head injury, initial encounter     Discharge Instructions      You have been seen for a mild head injury and superficial laceration to the forehead. Most problems from a head injury come within the first 24 hours. However, you may still have side effects up to 7-10 days after your injury. It is important to watch your condition for any changes.  Make sure your wife watches you closely and check on you often.  Get plenty of physical rest. Rest your brain by decreasing screen time (TV, cell phone, lap top, etc.) and avoid direct/bright lights  Take pain medicines as needed \ Do not take any OTC NSAIDS (aleve, motrin, ibuprofen, naproxen)  You may take tylenol in addition to the prescribed pain mediations if needed  Do not scratch, rub, or pick at the skin  glue. Do not put tape over the skin glue. The skin glue could come off when you take the tape off. Check your wound daily to make sure it is not starting to reopen. Check your wound area every day for signs of infection which includes: redness, swelling, pain, fluid, blood, warmth/rash around the wound, pus or a bad smell from the wound   Go to the ED immediately if you have:  A very bad headache that is not helped by medicine. Trouble walking or weakness in your arms and legs. Clear or bloody fluid coming from your nose or ears. Changes in how you see (vision). A seizure. More confusion or more grumpy moods. Your symptoms get worse. You are sleepier than normal and have trouble staying awake. You lose your balance. The black  centers of your eyes (pupils) change in size Your speech is slurred. Your dizziness gets worse. You vomit.      ED Prescriptions     Medication Sig Dispense Auth. Provider   ketorolac (TORADOL) 10 MG tablet Take 1 tablet (10 mg total) by mouth every 6 (six) hours as needed for moderate pain. 20 tablet Lurline Idol, FNP      PDMP not reviewed this encounter.   Lurline Idol, [place] 02/28/22 1913

## 2022-02-28 NOTE — ED Triage Notes (Signed)
Patient states he has an injured foot. Stumbled in the kitchen and hit his head on the range above the stove. Incident was a little over 30 minutes ago. Wound recently stopped bleeding, Patient states his head is pounding, pain 7/10.   Laceration on the right forehead. Patient having blurred vision in the right eye. States has a history of bad eye sight but this feels different.

## 2022-02-28 NOTE — Discharge Instructions (Addendum)
You have been seen for a mild head injury and superficial laceration to the forehead. Most problems from a head injury come within the first 24 hours. However, you may still have side effects up to 7-10 days after your injury. It is important to watch your condition for any changes.  Make sure your wife watches you closely and check on you often.  Get plenty of physical rest. Rest your brain by decreasing screen time (TV, cell phone, lap top, etc.) and avoid direct/bright lights  Take pain medicines as needed \ Do not take any OTC NSAIDS (aleve, motrin, ibuprofen, naproxen)  You may take tylenol in addition to the prescribed pain mediations if needed  Do not scratch, rub, or pick at the skin glue. Do not put tape over the skin glue. The skin glue could come off when you take the tape off. Check your wound daily to make sure it is not starting to reopen. Check your wound area every day for signs of infection which includes: redness, swelling, pain, fluid, blood, warmth/rash around the wound, pus or a bad smell from the wound   Go to the ED immediately if you have:  A very bad headache that is not helped by medicine. Trouble walking or weakness in your arms and legs. Clear or bloody fluid coming from your nose or ears. Changes in how you see (vision). A seizure. More confusion or more grumpy moods. Your symptoms get worse. You are sleepier than normal and have trouble staying awake. You lose your balance. The black centers of your eyes (pupils) change in size Your speech is slurred. Your dizziness gets worse. You vomit.

## 2022-03-03 NOTE — Progress Notes (Signed)
Subjective: 56 year old male presents with above concerns as an acute appointment.  He still been following the wound care center but he is concerned that the wound on the bottom of the foot is opened back up as he is seeing increased callus tissue.  He denies any drainage or pus.  He is back on a regular shoe but he needs to get a new shoe ordered.  He denies any fevers or chills.  No other concerns.    Objective: AAO x3, NAD DP/PT pulses palpable bilaterally, CRT less than 3 seconds On the left side along the lateral malleolus ulceration is healed.  On the plantar aspect of the foot along the area of previous ulceration there is thick hyperkeratotic tissue with dried blood but after debridement the wound is actually quite small measuring about 0.4 x 0.2 x 0.1 slightly but any probing, undermining or tunneling.  There is granulation tissue present.  There is no drainage or pus.  There is no fluctuation or crepitation there is no malodor.  I was not able to measure the wound prior to debridement as it was covered with callus formation.  No other ulcerations are noted. 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  -Medically necessary debridement was performed today.  Sharply debrided the wound today utilizing #312 with scalpel any complications and healthy, granular tissue.  Pre and post wound measurements are noted above.  There was no significant blood loss.  Area skin with saline and Silvadene was applied followed by dressing.  Continue daily dressing changes. -Follow-up with the wound care center he has an appointment scheduled for next week. -Follow-up for measurement for new shoes -Monitor for any clinical signs or symptoms of infection and directed to call the office immediately should  any occur or go to the ER.  Trula Slade DPM

## 2022-06-04 ENCOUNTER — Telehealth: Payer: Self-pay | Admitting: Podiatry

## 2022-06-04 NOTE — Telephone Encounter (Signed)
error 

## 2022-06-05 ENCOUNTER — Ambulatory Visit (INDEPENDENT_AMBULATORY_CARE_PROVIDER_SITE_OTHER): Payer: BLUE CROSS/BLUE SHIELD | Admitting: Podiatry

## 2022-06-05 ENCOUNTER — Ambulatory Visit (INDEPENDENT_AMBULATORY_CARE_PROVIDER_SITE_OTHER): Payer: BLUE CROSS/BLUE SHIELD

## 2022-06-05 VITALS — BP 112/71 | HR 72

## 2022-06-05 DIAGNOSIS — Z89422 Acquired absence of other left toe(s): Secondary | ICD-10-CM | POA: Diagnosis not present

## 2022-06-05 DIAGNOSIS — M216X9 Other acquired deformities of unspecified foot: Secondary | ICD-10-CM

## 2022-06-05 DIAGNOSIS — G600 Hereditary motor and sensory neuropathy: Secondary | ICD-10-CM

## 2022-06-05 DIAGNOSIS — L97522 Non-pressure chronic ulcer of other part of left foot with fat layer exposed: Secondary | ICD-10-CM

## 2022-06-05 MED ORDER — AMOXICILLIN-POT CLAVULANATE 875-125 MG PO TABS: 1.0000 | ORAL_TABLET | Freq: Two times a day (BID) | ORAL | 0 refills | Status: DC

## 2022-06-05 NOTE — Progress Notes (Signed)
Subjective: Chief Complaint  Patient presents with   Diabetic Ulcer    New left foot ulcer, patient has been seen by wound care, patient has some throbbing pain, and redness in the area, X-rays taken today    57 year old male presents the office today for concerns of a wound, redness left foot.  He states that he went shoes.  He gets some throbbing to the area.  No pus or reports.  No fevers or chills.  He is following up with the wound care center next week.   Objective: AAO x3, NAD DP/PT pulses palpable bilaterally, CRT less than 3 seconds Sensation decreased. Cavus foot type is present. Aspect of the foot along the fifth metatarsal distally is hyperkeratotic tissue with granular central wound.  Prior to debridement the wound measured 0.8 x 0.5 x 0.3 cm and had hyperkeratotic periwound.  After debridement it was larger measuring 1.1 x 0.8 x 0.5 cm.  There is no probing to bone, undermining or tunneling.  There is erythema to the area and there is mild erythema going on the dorsal aspect of the foot.  There is no fluctuation or crepitation.  There is no malodor. No pain with calf compression, swelling, warmth, erythema       Assessment: Ulceration left foot with cellulitis  Plan: -All treatment options discussed with the patient including all alternatives, risks, complications.  -X-rays were obtained and reviewed.  3 views of the foot were obtained.  There is no evidence of acute fracture.  No definitive evidence of cortical destruction to suggest osteomyelitis at this time.  No soft tissue emphysema. -Medically necessary wound debridement was performed.  I utilized a #312 with scalpel to sharply debride the hyperkeratotic tissue to remove nonviable devitalized tissue to promote wound healing.  Debrided down to healthy, granular tissue.  Pre and post wound measurements are noted above.  Irrigated with saline.  Medihoney was applied followed by dressing.  Send continue daily dressing  changes with this as well.  Offloading. -Augmentin -He has had a change in medical condition since his last brace was made order was given to him to get a new brace made hopefully this will help offload as well. -Monitor for any clinical signs or symptoms of infection and directed to call the office immediately should any occur or go to the ER. -Patient encouraged to call the office with any questions, concerns, change in symptoms.   Return in about 2 weeks (around 06/19/2022).  Trula Slade DPM

## 2022-06-23 ENCOUNTER — Ambulatory Visit (INDEPENDENT_AMBULATORY_CARE_PROVIDER_SITE_OTHER): Payer: BLUE CROSS/BLUE SHIELD | Admitting: Podiatry

## 2022-06-23 DIAGNOSIS — L97522 Non-pressure chronic ulcer of other part of left foot with fat layer exposed: Secondary | ICD-10-CM

## 2022-06-23 DIAGNOSIS — M216X9 Other acquired deformities of unspecified foot: Secondary | ICD-10-CM | POA: Diagnosis not present

## 2022-06-25 NOTE — Progress Notes (Signed)
Subjective: Chief Complaint  Patient presents with   Foot Ulcer    2 weeks (around 06/19/2022) uler, open wound, left foot, patient stated he is doing ok     57 year old male presents the office today for follow-up evaluation of a wound, redness left foot.  States he is doing much better.  Wound is almost completely healed.  Not to injure any pus.  No recent swelling or redness.  Denies any fevers or chills.  No other concerns.   Awaiting brace.   Denies any fevers or chills.  Objective: AAO x3, NAD DP/PT pulses palpable bilaterally, CRT less than 3 seconds Sensation decreased. Cavus foot type is present. Along the area of the fifth metatarsal appears to be almost completely healed 1 small pinpoint opening.  No surrounding erythema, ascending cellulitis.  No drainage or pus.  No crepitus or crepitation.  Preulcerative area on the fifth metatarsal base on the area of previous wound.  No blood loss today. No pain with calf compression, swelling, warmth, erythema   Assessment: Ulceration left foot with cellulitis  Plan: -All treatment options discussed with the patient including all alternatives, risks, complications.  -Separate debride the hyperkeratotic lesions.  Only 1 small area of opening still remains.  Continue daily dressing changes, offloading.  Monitor closely for any signs or symptoms of infection.  Awaiting brace.  Trula Slade DPM

## 2022-06-30 ENCOUNTER — Other Ambulatory Visit: Payer: Self-pay | Admitting: Podiatry

## 2022-06-30 DIAGNOSIS — L97522 Non-pressure chronic ulcer of other part of left foot with fat layer exposed: Secondary | ICD-10-CM

## 2022-06-30 DIAGNOSIS — M216X9 Other acquired deformities of unspecified foot: Secondary | ICD-10-CM

## 2022-06-30 DIAGNOSIS — Z89422 Acquired absence of other left toe(s): Secondary | ICD-10-CM

## 2022-06-30 DIAGNOSIS — G600 Hereditary motor and sensory neuropathy: Secondary | ICD-10-CM

## 2022-08-04 ENCOUNTER — Ambulatory Visit (INDEPENDENT_AMBULATORY_CARE_PROVIDER_SITE_OTHER): Payer: BLUE CROSS/BLUE SHIELD | Admitting: Podiatry

## 2022-08-04 ENCOUNTER — Encounter: Payer: Self-pay | Admitting: Podiatry

## 2022-08-04 DIAGNOSIS — M216X9 Other acquired deformities of unspecified foot: Secondary | ICD-10-CM

## 2022-08-04 DIAGNOSIS — L97522 Non-pressure chronic ulcer of other part of left foot with fat layer exposed: Secondary | ICD-10-CM | POA: Diagnosis not present

## 2022-08-11 ENCOUNTER — Telehealth: Payer: Self-pay | Admitting: Podiatry

## 2022-08-11 NOTE — Telephone Encounter (Signed)
[name] called to get a  letter about his foot being healed. Mr. [name] said that he is in a in house facility for his depression and they need something stating that his foot is completey healed, so they wont have to treat it. Please advise?

## 2022-08-11 NOTE — Progress Notes (Signed)
Subjective: Chief Complaint  Patient presents with   Foot Ulcer    Follow up ulcer left   "Its looking pretty good. Still waiting on the brace and trying to wrap it in the meantime"    57 year old male presents the office today for follow-up evaluation of a wound.  He presents today asking about different offloading pads.  He has been discharged to the wound care center and the wound is healed however he has not been going away for months for some rehab but he would like to discuss options to help avoid pressure.  Currently denies any drainage or pus.  No recent swelling or redness.  No other concerns.   Objective: AAO x3, NAD DP/PT pulses palpable bilaterally, CRT less than 3 seconds Sensation decreased. Cavus foot type is present. Along the area of the fifth metatarsal base there is 1 preulcerative callus noted today but is no drainage or pus.  There is no open lesion noted today.  There is no fluctuance or crepitation.  There is no malodor. No pain with calf compression, swelling, warmth, erythema   Assessment: Ulceration left foot with cellulitis  Plan: -All treatment options discussed with the patient including all alternatives, risks, complications.  -Lightly debrided hyperkeratotic tissue without any complications or bleeding.  This with the wound is doing much better and is healed but now healed but we need to continue with offloading help prevent reoccurrence.  Dispensed.  Offloading pads for him.  Stressed importance of daily foot inspection and trying to look at the foot a couple months from the date and make sure there is no pulse, open sores or any signs of infection.  Trula Slade DPM

## 2022-08-12 ENCOUNTER — Encounter: Payer: Self-pay | Admitting: Podiatry

## 2022-08-18 ENCOUNTER — Telehealth: Payer: Self-pay | Admitting: Podiatry

## 2022-08-18 NOTE — Telephone Encounter (Signed)
Pt called stating he talked with you over the weekend and you told him to call to get scheduled because his wound has opened back up a little.  I have him scheduled for 3.21

## 2022-08-21 ENCOUNTER — Ambulatory Visit (INDEPENDENT_AMBULATORY_CARE_PROVIDER_SITE_OTHER): Payer: BLUE CROSS/BLUE SHIELD | Admitting: Podiatry

## 2022-08-21 ENCOUNTER — Ambulatory Visit (INDEPENDENT_AMBULATORY_CARE_PROVIDER_SITE_OTHER): Payer: BLUE CROSS/BLUE SHIELD

## 2022-08-21 DIAGNOSIS — R52 Pain, unspecified: Secondary | ICD-10-CM

## 2022-08-21 DIAGNOSIS — L97522 Non-pressure chronic ulcer of other part of left foot with fat layer exposed: Secondary | ICD-10-CM | POA: Diagnosis not present

## 2022-08-21 DIAGNOSIS — L03116 Cellulitis of left lower limb: Secondary | ICD-10-CM

## 2022-08-21 MED ORDER — AMOXICILLIN-POT CLAVULANATE 875-125 MG PO TABS: 1.0000 | ORAL_TABLET | Freq: Two times a day (BID) | ORAL | 0 refills | Status: DC

## 2022-08-21 MED ORDER — SILVER SULFADIAZINE 1 % EX CREA: 1.0000 | TOPICAL_CREAM | Freq: Every day | CUTANEOUS | 0 refills | Status: DC

## 2022-08-21 NOTE — Patient Instructions (Signed)
DRESSING CHANGES:  PHARMACY SHOPPING LIST: Saline or Wound Cleanser for cleaning wound 2 x 2 inch sterile gauze for cleaning wound  A. IF DISPENSED, WEAR SURGICAL SHOE OR WALKING BOOT AT ALL TIMES.  B. IF PRESCRIBED ORAL ANTIBIOTICS, TAKE ALL MEDICATION AS PRESCRIBED UNTIL ALL ARE GONE.  C. IF DOCTOR HAS DESIGNATED NONWEIGHTBEARING STATUS, PLEASE ADHERE TO INSTRUCTIONS.  1. KEEP left lower extremity DRY AT ALL TIMES!!!!  2. CLEANSE ULCER WITH SALINE OR WOUND CLEANSER.  3. DAB DRY WITH GAUZE SPONGE.  4. APPLY A LIGHT AMOUNT OF Silvadene cream TO BASE OF ULCER.  5. APPLY OUTER DRESSING AS INSTRUCTED.  6. WEAR SURGICAL SHOE/BOOT DAILY AT ALL TIMES. IF SUPPLIED, WEAR HEEL PROTECTORS AT ALL TIMES WHEN IN BED.  7. DO NOT WALK BAREFOOT!!!  8.  IF YOU EXPERIENCE ANY FEVER, CHILLS, NIGHTSWEATS, NAUSEA OR VOMITING, ELEVATED OR LOW BLOOD SUGARS, REPORT TO EMERGENCY ROOM.  9. IF YOU EXPERIENCE INCREASED REDNESS, PAIN, SWELLING, DISCOLORATION, ODOR, PUS, DRAINAGE OR WARMTH OF YOUR FOOT, REPORT TO EMERGENCY ROOM.

## 2022-08-23 NOTE — Progress Notes (Signed)
Subjective: Chief Complaint  Patient presents with   Follow-up    Wound opened on left foot, minimal drainage    57 year old male presents the office today for follow-up evaluation of a wound.  He states that he has noticed the wound opened back up.  He went to an inpatient rehab facility and states that he was not able to take his dressings with him.  Wound is opened up since.  Noticing little bit of bloody drainage but no pus.  No some mild swelling and redness but no red streaks.  He does not report any fevers or chills.  States that his blood sugars been getting better as there for a while he was not taking any insulin but he is now being treated for depression more intensively.  Objective: AAO x3, NAD DP/PT pulses palpable bilaterally, CRT less than 3 seconds Sensation decreased. Cavus foot type is evident.  On the lateral aspect of the foot along the fifth metatarsal distally is superficial granular wound with localized edema and erythema without any ascending cellulitis.  There is no drainage or pus.  There is no fluctuation or crepitation.  There is no malodor. No pain with calf compression, swelling, warmth, erythema   Assessment: Ulceration left foot with cellulitis  Plan: -All treatment options discussed with the patient including all alternatives, risks, complications.  -X-rays were obtained reviewed of the left foot.  3 views were obtained.  No definitive cortical destruction suggest osteomyelitis.  No soft tissue emphysema.  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 -Wound appears to be clean and granular.  Bilaterally debrided some hyperkeratotic periwound office was minimal today.  Recommended Silvadene dressing changes daily which I prescribed.  Given the cellulitis I am going to also start antibiotics and I have ordered Augmentin. -Continue offloading -He is hopefully going to get his bracing and hopefully this will help  long-term.  Return in about 2 weeks (around 09/04/2022) for left foot ulcer.  Trula Slade DPM

## 2022-09-09 ENCOUNTER — Ambulatory Visit (INDEPENDENT_AMBULATORY_CARE_PROVIDER_SITE_OTHER): Payer: BLUE CROSS/BLUE SHIELD | Admitting: Podiatry

## 2022-09-09 DIAGNOSIS — L84 Corns and callosities: Secondary | ICD-10-CM

## 2022-09-09 DIAGNOSIS — L97522 Non-pressure chronic ulcer of other part of left foot with fat layer exposed: Secondary | ICD-10-CM

## 2022-09-10 NOTE — Progress Notes (Signed)
Subjective: Chief Complaint  Patient presents with   Foot Ulcer   57 year old male presents the office with above concerns.  He does not see any drainage or pus.  No present swelling.  No pain.  Still awaiting his brace.  No fevers or chills.    Objective: AAO x3, NAD DP/PT pulses palpable bilaterally, CRT less than 3 seconds Sensation decreased. Cavus foot type is evident.  On the lateral aspect of the foot along the fifth metatarsal distally is a hyperkeratotic lesion with this old dried blood upon debridement there is a small area of skin breakdown.  There is also a preulcerative lesion along the fifth metatarsal base.  There is no ascending cellulitis.  No fluctuance or crepitation.  No malodor. No pain with calf compression, swelling, warmth, erythema   Assessment: Ulceration left foot   Plan: -All treatment options discussed with the patient including all alternatives, risks, complications.  -Debrided the hyperkeratotic tissue x 2 without any complications.  There is 1 small superficial granular wound still present.  There is a mild rim of erythema likely more from inflammation.  There is no drainage or pus or increased temperature.  Continue with dressing changes with collagen silver daily.  Continue offloading.  Awaiting brace. -Monitor for any clinical signs or symptoms of infection and directed to call the office immediately should any occur or go to the ER.  Return in about 3 weeks (around 09/30/2022) for foot ulcer.  Vivi Barrack DPM

## 2022-10-06 ENCOUNTER — Telehealth: Payer: Self-pay | Admitting: Podiatry

## 2022-10-06 NOTE — Telephone Encounter (Signed)
Pt stated that he spoke Dr. Ardelle Anton over the weekend & he wanted him to come in as soon as possible. When you would like for him to come in/ The next available EST PT slot is not until 10/20/2022; unless you would like him scheduled in a NP slot for one day this week. Please advise.

## 2022-10-09 ENCOUNTER — Ambulatory Visit (INDEPENDENT_AMBULATORY_CARE_PROVIDER_SITE_OTHER): Payer: BLUE CROSS/BLUE SHIELD | Admitting: Podiatry

## 2022-10-09 ENCOUNTER — Ambulatory Visit (INDEPENDENT_AMBULATORY_CARE_PROVIDER_SITE_OTHER): Payer: BLUE CROSS/BLUE SHIELD

## 2022-10-09 DIAGNOSIS — L97522 Non-pressure chronic ulcer of other part of left foot with fat layer exposed: Secondary | ICD-10-CM

## 2022-10-09 DIAGNOSIS — L03116 Cellulitis of left lower limb: Secondary | ICD-10-CM | POA: Diagnosis not present

## 2022-10-09 MED ORDER — CEPHALEXIN 500 MG PO CAPS: 500.0000 mg | ORAL_CAPSULE | Freq: Four times a day (QID) | ORAL | 0 refills | Status: DC

## 2022-10-14 NOTE — Progress Notes (Signed)
Subjective: Chief Complaint  Patient presents with   Wound Check    left foot diabetic ulcer    57 year old male presents the office with above concerns.  States that he was in rehab for his depression and able to care for the wound opened back up.  He has an appointment with wound care center next week.  He has not seen any purulence.  Some occasional bleeding.  No fever or chills.     Objective: AAO x3, NAD DP/PT pulses palpable bilaterally, CRT less than 3 seconds Sensation decreased. Cavus foot type is evident.  On the lateral aspect of the left foot along the fifth metatarsal remnant is a hyperkeratotic tissue with a small granular wound present prior to debridement after debrided this measured 1.5 x 1.5 x 0.2 cm with a granular wound base.  Moderate erythema or any ascending cellulitis there is no fluctuation or crepitation.  No malodor. No pain with calf compression, swelling, warmth, erythema   Assessment: Ulceration left foot   Plan: -All treatment options discussed with the patient including all alternatives, risks, complications.  -Medically necessary wound debridement was performed today.  Sharply debrided the ulceration utilized #312 with scalpel down to healthy, bleeding, granular tissue to help limit wound healing and debride nonviable devitalized tissue to promote wound healing.  There was no blood loss.  Hemostasis sheath and manual compression.  Pre and post wound measurements noted above.  Difficult to fully measure the wound as it is almost completely covered with callus.  Silvadene was applied today followed by dressing.  Send continue with daily dressing changes so he follows up with the wound care center next week.  Prescribed cephalexin given the erythema.  I discussed with him possible surgical resection of the bony prominence if needed. -Monitor for any clinical signs or symptoms of infection and directed to call the office immediately should any occur or go to the  ER.  No follow-ups on file.  Vivi Barrack DPM

## 2023-07-15 ENCOUNTER — Other Ambulatory Visit: Payer: Self-pay | Admitting: Medical Genetics

## 2023-07-21 ENCOUNTER — Other Ambulatory Visit (HOSPITAL_COMMUNITY): Payer: Self-pay

## 2023-07-28 ENCOUNTER — Emergency Department (HOSPITAL_COMMUNITY)
Admission: EM | Admit: 2023-07-28 | Discharge: 2023-07-29 | Discharge status: Against Medical Advice | Payer: Medicaid Other | Source: Home / Self Care

## 2023-07-28 ENCOUNTER — Other Ambulatory Visit (HOSPITAL_COMMUNITY): Payer: Self-pay | Attending: Medical Genetics

## 2023-07-28 ENCOUNTER — Telehealth: Payer: Self-pay | Admitting: Podiatry

## 2023-07-28 NOTE — Telephone Encounter (Signed)
 Pt called and went to [facility] last night and they did xray on left foot ulcer and they dx with osteomyelitis and he is not sure what he needed to do.HE would like you to look at the results and notes from the ed and advise based on those.

## 2023-07-29 ENCOUNTER — Ambulatory Visit (INDEPENDENT_AMBULATORY_CARE_PROVIDER_SITE_OTHER): Payer: Medicaid Other | Admitting: Podiatry

## 2023-07-29 ENCOUNTER — Other Ambulatory Visit: Payer: Self-pay

## 2023-07-29 ENCOUNTER — Emergency Department (HOSPITAL_BASED_OUTPATIENT_CLINIC_OR_DEPARTMENT_OTHER): Payer: Medicaid Other

## 2023-07-29 ENCOUNTER — Encounter: Payer: Self-pay | Admitting: Podiatry

## 2023-07-29 ENCOUNTER — Inpatient Hospital Stay (HOSPITAL_BASED_OUTPATIENT_CLINIC_OR_DEPARTMENT_OTHER)
Admission: EM | Admit: 2023-07-29 | Discharge: 2023-08-07 | DRG: 982 | Disposition: A | Discharge status: Home / Self Care | Payer: Medicaid Other | Attending: Internal Medicine | Admitting: Internal Medicine

## 2023-07-29 ENCOUNTER — Encounter (HOSPITAL_BASED_OUTPATIENT_CLINIC_OR_DEPARTMENT_OTHER): Payer: Self-pay | Admitting: Emergency Medicine

## 2023-07-29 ENCOUNTER — Telehealth: Payer: Self-pay | Admitting: Podiatry

## 2023-07-29 ENCOUNTER — Ambulatory Visit (INDEPENDENT_AMBULATORY_CARE_PROVIDER_SITE_OTHER): Payer: BLUE CROSS/BLUE SHIELD

## 2023-07-29 DIAGNOSIS — I10 Essential (primary) hypertension: Secondary | ICD-10-CM | POA: Diagnosis present

## 2023-07-29 DIAGNOSIS — F909 Attention-deficit hyperactivity disorder, unspecified type: Secondary | ICD-10-CM | POA: Diagnosis present

## 2023-07-29 DIAGNOSIS — G894 Chronic pain syndrome: Secondary | ICD-10-CM | POA: Diagnosis present

## 2023-07-29 DIAGNOSIS — G629 Polyneuropathy, unspecified: Secondary | ICD-10-CM | POA: Diagnosis not present

## 2023-07-29 DIAGNOSIS — E1165 Type 2 diabetes mellitus with hyperglycemia: Secondary | ICD-10-CM | POA: Diagnosis present

## 2023-07-29 DIAGNOSIS — L97529 Non-pressure chronic ulcer of other part of left foot with unspecified severity: Secondary | ICD-10-CM | POA: Diagnosis present

## 2023-07-29 DIAGNOSIS — E876 Hypokalemia: Secondary | ICD-10-CM | POA: Insufficient documentation

## 2023-07-29 DIAGNOSIS — I2510 Atherosclerotic heart disease of native coronary artery without angina pectoris: Secondary | ICD-10-CM | POA: Diagnosis present

## 2023-07-29 DIAGNOSIS — Z87442 Personal history of urinary calculi: Secondary | ICD-10-CM

## 2023-07-29 DIAGNOSIS — M84675A Pathological fracture in other disease, left foot, initial encounter for fracture: Secondary | ICD-10-CM | POA: Diagnosis present

## 2023-07-29 DIAGNOSIS — M86172 Other acute osteomyelitis, left ankle and foot: Secondary | ICD-10-CM | POA: Diagnosis present

## 2023-07-29 DIAGNOSIS — N179 Acute kidney failure, unspecified: Secondary | ICD-10-CM | POA: Diagnosis present

## 2023-07-29 DIAGNOSIS — E11621 Type 2 diabetes mellitus with foot ulcer: Secondary | ICD-10-CM | POA: Diagnosis present

## 2023-07-29 DIAGNOSIS — Z87891 Personal history of nicotine dependence: Secondary | ICD-10-CM

## 2023-07-29 DIAGNOSIS — I808 Phlebitis and thrombophlebitis of other sites: Secondary | ICD-10-CM | POA: Diagnosis present

## 2023-07-29 DIAGNOSIS — K2100 Gastro-esophageal reflux disease with esophagitis, without bleeding: Secondary | ICD-10-CM | POA: Diagnosis present

## 2023-07-29 DIAGNOSIS — M86472 Chronic osteomyelitis with draining sinus, left ankle and foot: Secondary | ICD-10-CM | POA: Diagnosis not present

## 2023-07-29 DIAGNOSIS — E1169 Type 2 diabetes mellitus with other specified complication: Secondary | ICD-10-CM | POA: Diagnosis present

## 2023-07-29 DIAGNOSIS — Z794 Long term (current) use of insulin: Secondary | ICD-10-CM

## 2023-07-29 DIAGNOSIS — M79671 Pain in right foot: Secondary | ICD-10-CM | POA: Diagnosis not present

## 2023-07-29 DIAGNOSIS — Z803 Family history of malignant neoplasm of breast: Secondary | ICD-10-CM

## 2023-07-29 DIAGNOSIS — G6181 Chronic inflammatory demyelinating polyneuritis: Secondary | ICD-10-CM | POA: Diagnosis present

## 2023-07-29 DIAGNOSIS — E11649 Type 2 diabetes mellitus with hypoglycemia without coma: Secondary | ICD-10-CM | POA: Diagnosis present

## 2023-07-29 DIAGNOSIS — Z8711 Personal history of peptic ulcer disease: Secondary | ICD-10-CM

## 2023-07-29 DIAGNOSIS — L98499 Non-pressure chronic ulcer of skin of other sites with unspecified severity: Secondary | ICD-10-CM | POA: Diagnosis not present

## 2023-07-29 DIAGNOSIS — M869 Osteomyelitis, unspecified: Principal | ICD-10-CM

## 2023-07-29 DIAGNOSIS — M86272 Subacute osteomyelitis, left ankle and foot: Secondary | ICD-10-CM | POA: Diagnosis present

## 2023-07-29 DIAGNOSIS — Z823 Family history of stroke: Secondary | ICD-10-CM

## 2023-07-29 DIAGNOSIS — F32A Depression, unspecified: Secondary | ICD-10-CM | POA: Diagnosis present

## 2023-07-29 DIAGNOSIS — E785 Hyperlipidemia, unspecified: Secondary | ICD-10-CM | POA: Diagnosis present

## 2023-07-29 DIAGNOSIS — E1110 Type 2 diabetes mellitus with ketoacidosis without coma: Secondary | ICD-10-CM

## 2023-07-29 DIAGNOSIS — E1142 Type 2 diabetes mellitus with diabetic polyneuropathy: Secondary | ICD-10-CM | POA: Diagnosis present

## 2023-07-29 DIAGNOSIS — L97522 Non-pressure chronic ulcer of other part of left foot with fat layer exposed: Secondary | ICD-10-CM | POA: Diagnosis not present

## 2023-07-29 DIAGNOSIS — M868X7 Other osteomyelitis, ankle and foot: Secondary | ICD-10-CM | POA: Diagnosis not present

## 2023-07-29 DIAGNOSIS — Z7982 Long term (current) use of aspirin: Secondary | ICD-10-CM

## 2023-07-29 DIAGNOSIS — Z888 Allergy status to other drugs, medicaments and biological substances status: Secondary | ICD-10-CM

## 2023-07-29 LAB — CBC WITH DIFFERENTIAL/PLATELET
Abs Immature Granulocytes: 0.03 10*3/uL (ref 0.00–0.07)
Basophils Absolute: 0.1 10*3/uL (ref 0.0–0.1)
Basophils Relative: 1 %
Eosinophils Absolute: 0.1 10*3/uL (ref 0.0–0.5)
Eosinophils Relative: 1 %
HCT: 36.6 % — ABNORMAL LOW (ref 39.0–52.0)
Hemoglobin: 12.7 g/dL — ABNORMAL LOW (ref 13.0–17.0)
Immature Granulocytes: 0 %
Lymphocytes Relative: 20 %
Lymphs Abs: 2 10*3/uL (ref 0.7–4.0)
MCH: 28.8 pg (ref 26.0–34.0)
MCHC: 34.7 g/dL (ref 30.0–36.0)
MCV: 83 fL (ref 80.0–100.0)
Monocytes Absolute: 0.9 10*3/uL (ref 0.1–1.0)
Monocytes Relative: 9 %
Neutro Abs: 7 10*3/uL (ref 1.7–7.7)
Neutrophils Relative %: 69 %
Platelets: 303 10*3/uL (ref 150–400)
RBC: 4.41 MIL/uL (ref 4.22–5.81)
RDW: 13.2 % (ref 11.5–15.5)
WBC: 10.2 10*3/uL (ref 4.0–10.5)
nRBC: 0 % (ref 0.0–0.2)

## 2023-07-29 LAB — COMPREHENSIVE METABOLIC PANEL
ALT: 25 U/L (ref 0–44)
AST: 26 U/L (ref 15–41)
Albumin: 4.3 g/dL (ref 3.5–5.0)
Alkaline Phosphatase: 65 U/L (ref 38–126)
Anion gap: 10 (ref 5–15)
BUN: 13 mg/dL (ref 6–20)
CO2: 28 mmol/L (ref 22–32)
Calcium: 10.1 mg/dL (ref 8.9–10.3)
Chloride: 99 mmol/L (ref 98–111)
Creatinine, Ser: 1.07 mg/dL (ref 0.61–1.24)
GFR, Estimated: >60 mL/min (ref >60)
Glucose, Bld: 53 mg/dL — ABNORMAL LOW (ref 70–99)
Potassium: 2.7 mmol/L — CL (ref 3.5–5.1)
Sodium: 137 mmol/L (ref 135–145)
Total Bilirubin: 0.6 mg/dL (ref 0.0–1.2)
Total Protein: 7.7 g/dL (ref 6.5–8.1)

## 2023-07-29 LAB — MAGNESIUM: Magnesium: 1.9 mg/dL (ref 1.7–2.4)

## 2023-07-29 LAB — LACTIC ACID, PLASMA: Lactic Acid, Venous: 1.8 mmol/L (ref 0.5–1.9)

## 2023-07-29 LAB — GLUCOSE, CAPILLARY: Glucose-Capillary: 269 mg/dL — ABNORMAL HIGH (ref 70–99)

## 2023-07-29 MED ORDER — PIPERACILLIN-TAZOBACTAM 3.375 G IVPB
3.3750 g | Freq: Three times a day (TID) | INTRAVENOUS | Status: DC
  Administered 2023-07-30 (×2): 3.375 g via INTRAVENOUS
  Filled 2023-07-29 (×2): qty 50

## 2023-07-29 MED ORDER — ENOXAPARIN SODIUM 40 MG/0.4ML IJ SOSY: 40.0000 mg | PREFILLED_SYRINGE | Freq: Every day | INTRAMUSCULAR | Status: DC

## 2023-07-29 MED ORDER — GABAPENTIN 400 MG PO CAPS
800.0000 mg | ORAL_CAPSULE | Freq: Three times a day (TID) | ORAL | Status: DC
  Administered 2023-07-29 – 2023-08-07 (×25): 800 mg via ORAL
  Filled 2023-07-29 (×25): qty 2

## 2023-07-29 MED ORDER — VENLAFAXINE HCL ER 150 MG PO CP24
150.0000 mg | ORAL_CAPSULE | Freq: Every day | ORAL | Status: DC
  Administered 2023-07-29 – 2023-07-31 (×2): 150 mg via ORAL
  Filled 2023-07-29 (×3): qty 1

## 2023-07-29 MED ORDER — PIPERACILLIN-TAZOBACTAM 3.375 G IVPB 30 MIN
3.3750 g | Freq: Once | INTRAVENOUS | Status: AC
  Administered 2023-07-29: 3.375 g via INTRAVENOUS
  Filled 2023-07-29: qty 50

## 2023-07-29 MED ORDER — ACETAMINOPHEN 650 MG RE SUPP: 650.0000 mg | Freq: Four times a day (QID) | RECTAL | Status: DC | PRN

## 2023-07-29 MED ORDER — VANCOMYCIN HCL IN DEXTROSE 1-5 GM/200ML-% IV SOLN
1000.0000 mg | Freq: Once | INTRAVENOUS | Status: AC
Start: 2023-07-29 — End: 2023-07-29
  Administered 2023-07-29: 1000 mg via INTRAVENOUS
  Filled 2023-07-29: qty 200

## 2023-07-29 MED ORDER — BUPROPION HCL ER (XL) 150 MG PO TB24: 150.0000 mg | ORAL_TABLET | Freq: Every day | ORAL | Status: DC

## 2023-07-29 MED ORDER — RAMIPRIL 1.25 MG PO CAPS
2.5000 mg | ORAL_CAPSULE | Freq: Every day | ORAL | Status: DC
  Administered 2023-07-30 – 2023-08-02 (×4): 2.5 mg via ORAL
  Filled 2023-07-29 (×5): qty 2

## 2023-07-29 MED ORDER — INSULIN GLARGINE-YFGN 100 UNIT/ML ~~LOC~~ SOLN
40.0000 [IU] | Freq: Every day | SUBCUTANEOUS | Status: DC
  Filled 2023-07-29: qty 0.4

## 2023-07-29 MED ORDER — VANCOMYCIN HCL IN DEXTROSE 1-5 GM/200ML-% IV SOLN: 1000.0000 mg | Freq: Once | INTRAVENOUS | Status: DC

## 2023-07-29 MED ORDER — FENOFIBRATE 160 MG PO TABS
160.0000 mg | ORAL_TABLET | Freq: Every day | ORAL | Status: DC
  Administered 2023-07-30 – 2023-08-05 (×7): 160 mg via ORAL
  Filled 2023-07-29 (×7): qty 1

## 2023-07-29 MED ORDER — OXYCODONE HCL 5 MG PO TABS
5.0000 mg | ORAL_TABLET | ORAL | Status: DC | PRN
  Administered 2023-07-30 – 2023-08-07 (×11): 5 mg via ORAL
  Filled 2023-07-29 (×12): qty 1

## 2023-07-29 MED ORDER — VANCOMYCIN HCL IN DEXTROSE 1-5 GM/200ML-% IV SOLN
1000.0000 mg | Freq: Once | INTRAVENOUS | Status: AC
  Administered 2023-07-29: 1000 mg via INTRAVENOUS
  Filled 2023-07-29: qty 200

## 2023-07-29 MED ORDER — INSULIN GLARGINE 100 UNIT/ML ~~LOC~~ SOLN
40.0000 [IU] | Freq: Every day | SUBCUTANEOUS | Status: DC
  Administered 2023-07-30 – 2023-07-31 (×2): 40 [IU] via SUBCUTANEOUS
  Filled 2023-07-29 (×3): qty 0.4

## 2023-07-29 MED ORDER — POTASSIUM CHLORIDE 10 MEQ/100ML IV SOLN
10.0000 meq | INTRAVENOUS | Status: AC
  Administered 2023-07-29 (×2): 10 meq via INTRAVENOUS
  Filled 2023-07-29 (×2): qty 100

## 2023-07-29 MED ORDER — LAMOTRIGINE 100 MG PO TABS: 150.0000 mg | ORAL_TABLET | Freq: Two times a day (BID) | ORAL | Status: DC

## 2023-07-29 MED ORDER — LUMATEPERONE TOSYLATE 42 MG PO CAPS
42.0000 mg | ORAL_CAPSULE | Freq: Every day | ORAL | Status: DC
  Administered 2023-07-29 – 2023-07-31 (×3): 42 mg via ORAL
  Filled 2023-07-29 (×4): qty 1

## 2023-07-29 MED ORDER — DULOXETINE HCL 60 MG PO CPEP: 60.0000 mg | ORAL_CAPSULE | Freq: Every day | ORAL | Status: DC

## 2023-07-29 MED ORDER — VANCOMYCIN HCL 1250 MG/250ML IV SOLN
1250.0000 mg | Freq: Two times a day (BID) | INTRAVENOUS | Status: DC
  Administered 2023-07-30 – 2023-08-01 (×5): 1250 mg via INTRAVENOUS
  Filled 2023-07-29 (×5): qty 250

## 2023-07-29 MED ORDER — PANTOPRAZOLE SODIUM 40 MG PO TBEC
40.0000 mg | DELAYED_RELEASE_TABLET | Freq: Every day | ORAL | Status: DC
  Administered 2023-07-30 – 2023-08-07 (×9): 40 mg via ORAL
  Filled 2023-07-29 (×9): qty 1

## 2023-07-29 MED ORDER — ONDANSETRON HCL 4 MG/2ML IJ SOLN: 4.0000 mg | Freq: Four times a day (QID) | INTRAMUSCULAR | Status: DC | PRN

## 2023-07-29 MED ORDER — INSULIN ASPART 100 UNIT/ML IJ SOLN
0.0000 [IU] | Freq: Three times a day (TID) | INTRAMUSCULAR | Status: DC
  Administered 2023-07-30: 12 [IU] via SUBCUTANEOUS
  Administered 2023-07-30: 8 [IU] via SUBCUTANEOUS
  Administered 2023-07-31: 2 [IU] via SUBCUTANEOUS
  Administered 2023-07-31: 4 [IU] via SUBCUTANEOUS
  Administered 2023-07-31: 12 [IU] via SUBCUTANEOUS
  Administered 2023-08-02 (×2): 4 [IU] via SUBCUTANEOUS
  Administered 2023-08-02: 8 [IU] via SUBCUTANEOUS
  Administered 2023-08-03 – 2023-08-04 (×4): 4 [IU] via SUBCUTANEOUS
  Administered 2023-08-04 – 2023-08-05 (×2): 2 [IU] via SUBCUTANEOUS

## 2023-07-29 MED ORDER — ATORVASTATIN CALCIUM 10 MG PO TABS
10.0000 mg | ORAL_TABLET | Freq: Every day | ORAL | Status: DC
  Administered 2023-07-30 – 2023-08-07 (×9): 10 mg via ORAL
  Filled 2023-07-29 (×9): qty 1

## 2023-07-29 MED ORDER — ACETAMINOPHEN 325 MG PO TABS
650.0000 mg | ORAL_TABLET | Freq: Four times a day (QID) | ORAL | Status: DC | PRN
  Administered 2023-07-31 – 2023-08-05 (×2): 650 mg via ORAL
  Filled 2023-07-29 (×3): qty 2

## 2023-07-29 MED ORDER — ASPIRIN 81 MG PO TBEC
81.0000 mg | DELAYED_RELEASE_TABLET | Freq: Every day | ORAL | Status: DC
  Administered 2023-07-31 – 2023-08-07 (×8): 81 mg via ORAL
  Filled 2023-07-29 (×9): qty 1

## 2023-07-29 MED ORDER — ONDANSETRON HCL 4 MG PO TABS: 4.0000 mg | ORAL_TABLET | Freq: Four times a day (QID) | ORAL | Status: DC | PRN

## 2023-07-29 NOTE — ED Provider Notes (Signed)
 [place] EMERGENCY DEPARTMENT AT [facility] Provider Note   CSN: [id number] Arrival date & time: 07/29/23  1403     History Chief Complaint  Patient presents with   Foot Pain    [name] is a 58 y.o. male.  Patient past history significant for type 2 diabetes, osteomyelitis presents emergency department concerns of foot pain.  Patient advised to come to the emergency department by his podiatrist for MRI imaging and admission for osteomyelitis in the left foot.  Patient reports prior history of similar symptoms.  Patient reports that this wound began sore in 2 to 3 months ago but is unclear exact when he first noticed it.  States that there has worsened with ulceration and drainage.  Uses a knee walker and not bearing weight due to discomfort.   Foot Pain       Home Medications Prior to Admission medications   Medication Sig Start Date End Date Taking? Authorizing Provider  acetaminophen (TYLENOL) 325 MG tablet Take 650 mg by mouth every 6 (six) hours as needed for mild pain or headache.    [provider]  amoxicillin-clavulanate (AUGMENTIN) 875-125 MG tablet Take 1 tablet by mouth 2 (two) times daily. 06/05/22   Vivi Barrack, DPM  amoxicillin-clavulanate (AUGMENTIN) 875-125 MG tablet Take 1 tablet by mouth 2 (two) times daily. 08/21/22   Vivi Barrack, DPM  Amphetamine Sulfate 10 MG TABS Take 20 mg by mouth 2 (two) times daily.     [provider]  aspirin 81 MG tablet Take 1 tablet (81 mg total) by mouth daily. 07/17/17   Berton Mount I, MD  atorvastatin (LIPITOR) 10 MG tablet Take 10 mg by mouth daily. 04/03/20   [provider]  buPROPion (WELLBUTRIN XL) 150 MG 24 hr tablet Take 150 mg by mouth daily. 04/01/18   [provider]  calcium carbonate (TUMS - DOSED IN MG ELEMENTAL CALCIUM) 500 MG chewable tablet Chew 1 tablet by mouth daily as needed for indigestion.    [provider]  cephALEXin (KEFLEX)  500 MG capsule Take 1 capsule (500 mg total) by mouth 4 (four) times daily. 10/09/22   Vivi Barrack, DPM  Continuous Blood Gluc Receiver (FREESTYLE LIBRE 14 DAY READER) DEVI every 14 (fourteen) days.  01/05/18   [provider]  Continuous Blood Gluc Sensor (FREESTYLE LIBRE 14 DAY SENSOR) MISC  04/14/18   [provider]  dicyclomine (BENTYL) 20 MG tablet Take 1 tablet (20 mg total) by mouth 2 (two) times daily. 02/05/21   Garlon Hatchet, PA-C  DULoxetine (CYMBALTA) 60 MG capsule Take 60 mg by mouth daily. 02/26/18   [provider]  ergocalciferol (VITAMIN D2) 1.25 MG (50000 UT) capsule Take 50,000 Units by mouth once a week. 08/13/16   [provider]  fenofibrate 160 MG tablet Take 160 mg by mouth daily.    [provider]  fluconazole (DIFLUCAN) 100 MG tablet Take 2 tablets by mouth on day 1 then take 1 tablet daily until gone 07/22/17   [name], MD  gabapentin (NEURONTIN) 800 MG tablet Take 800 mg by mouth 3 (three) times daily.    [provider]  gentamicin cream (GARAMYCIN) 0.1 % Apply 1 application topically in the morning and at bedtime. 11/16/20   Vivi Barrack, DPM  HYDROcodone-acetaminophen (NORCO/VICODIN) 5-325 MG tablet Take 1-2 tablets by mouth every 6 (six) hours as needed. 01/23/21   Vivi Barrack, DPM  insulin aspart (NOVOLOG) 100  UNIT/ML FlexPen Inject 22-32 Units into the skin See admin instructions. Inject 22-32 units into the skin three to four times a day before meals, per sliding scale    [provider]  insulin NPH-regular Human (NOVOLIN 70/30) (70-30) 100 UNIT/ML injection Insulin NPH 40 units in am and 30 Units at night time. 07/16/17   Barnetta Chapel, MD  ketorolac (TORADOL) 10 MG tablet Take 1 tablet (10 mg total) by mouth every 6 (six) hours as needed for moderate pain. 02/28/22   Lurline Idol, FNP  lamoTRIgine (LAMICTAL) 150 MG tablet Take 150 mg by mouth 2 (two) times daily. 03/02/20    [provider]  ondansetron (ZOFRAN ODT) 4 MG disintegrating tablet Take 1 tablet (4 mg total) by mouth every 8 (eight) hours as needed for nausea. 02/05/21   Garlon Hatchet, PA-C  ondansetron (ZOFRAN) 4 MG tablet Take 1 tablet (4 mg total) by mouth every 8 (eight) hours as needed for nausea or vomiting. 01/23/21   Vivi Barrack, DPM  pantoprazole (PROTONIX) 40 MG tablet Take 1 tablet (40 mg total) by mouth daily before breakfast. 07/17/17   Barnetta Chapel, MD  promethazine (PHENERGAN) 25 MG tablet Take 1 tablet (25 mg total) by mouth every 8 (eight) hours as needed for nausea or vomiting. 01/11/20   Vivi Barrack, DPM  ramipril (ALTACE) 2.5 MG capsule Take 2.5 mg by mouth daily. 05/06/20   [provider]  SEMGLEE, YFGN, 100 UNIT/ML Pen Inject 80 Units into the skin at bedtime. 01/07/21   [provider]  sildenafil (REVATIO) 20 MG tablet Take 60 mg by mouth daily as needed (ED). 08/20/18   [provider]  silver sulfADIAZINE (SILVADENE) 1 % cream Apply 1 Application topically daily. 08/21/22   Vivi Barrack, DPM  TECHLITE PEN NEEDLES 31G X 8 MM MISC USE TO GIVE INSULIN 4 TIMES DAILY 09/01/19   [provider]  zolpidem (AMBIEN CR) 6.25 MG CR tablet Take 6.25 mg by mouth at bedtime. 02/03/21   [provider]      Allergies    Lithium, Adderall [amphetamine-dextroamphetamine], and Ritalin [methylphenidate]    Review of Systems   Review of Systems  Skin:  Positive for wound.  All other systems reviewed and are negative.   Physical Exam Updated Vital Signs BP (!) 142/88 (BP Location: Right Arm)   Pulse 88   Temp 98.2 F (36.8 C) (Oral)   Resp 20   SpO2 100%  Physical Exam Vitals and nursing note reviewed.  Constitutional:      General: He is not in acute distress.    Appearance: He is well-developed.  HENT:     Head: Normocephalic and atraumatic.  Eyes:     Conjunctiva/sclera: Conjunctivae normal.  Cardiovascular:      Rate and Rhythm: Normal rate and regular rhythm.     Heart sounds: No murmur heard. Pulmonary:     Effort: Pulmonary effort is normal. No respiratory distress.     Breath sounds: Normal breath sounds.  Abdominal:     Palpations: Abdomen is soft.     Tenderness: There is no abdominal tenderness.  Musculoskeletal:        General: Tenderness present. No swelling.     Cervical back: Neck supple.  Skin:    General: Skin is warm and dry.     Capillary Refill: Capillary refill takes less than 2 seconds.     Findings: Erythema and lesion present.     Comments:  Wound with ulceration present at the lateral left foot. Clear drainage present. Significant erythema and warmth.  Neurological:     Mental Status: He is alert.  Psychiatric:        Mood and Affect: Mood normal.        ED Results / Procedures / Treatments   Labs (all labs ordered are listed, but only abnormal results are displayed) Labs Reviewed  COMPREHENSIVE METABOLIC PANEL - Abnormal; Notable for the following components:      Result Value   Potassium 2.7 (*)    Glucose, Bld 53 (*)    All other components within normal limits  CBC WITH DIFFERENTIAL/PLATELET - Abnormal; Notable for the following components:   Hemoglobin 12.7 (*)    HCT 36.6 (*)    All other components within normal limits  GLUCOSE, CAPILLARY - Abnormal; Notable for the following components:   Glucose-Capillary 269 (*)    All other components within normal limits  CULTURE, BLOOD (ROUTINE X 2)  CULTURE, BLOOD (ROUTINE X 2)  AEROBIC CULTURE W GRAM STAIN (SUPERFICIAL SPECIMEN)  LACTIC ACID, PLASMA  MAGNESIUM    EKG None  Radiology DG Foot Complete Right Result Date: 07/29/2023 Please see detailed radiograph report in office note.  DG Foot Complete Left Result Date: 07/29/2023 CLINICAL DATA:  Osteomyelitis. Left foot pain. Weakness and body aches. EXAM: LEFT FOOT - COMPLETE 3+ VIEW COMPARISON:  07/27/2023 FINDINGS: Postoperative changes with  resection of the left fifth ray at the mid metatarsal level and of the mid/distal aspect of the left fourth metatarsal. Diffuse bone demineralization. Severe degenerative changes in the interphalangeal, remaining metatarsal-phalangeal, tarsometatarsal, and intertarsal joints. Degenerative changes also demonstrated in the ankle joint. There is a soft tissue defect over the base of the fifth metatarsal with underlying cortical erosion, possibly osteomyelitis. There is a linear lucency transversely across the base of the fifth metatarsal which could represent also a nondisplaced fracture. Lucency again demonstrated in the lateral aspect of the first metatarsal head, nonspecific for osteomyelitis versus degenerative change. 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 No radiopaque soft tissue foreign bodies or soft tissue gas. Vascular calcifications. IMPRESSION: 1. Soft tissue defect over the base of the fifth metatarsal with underlying cortical erosion, possibly osteomyelitis, and with linear lucency possibly indicating nondisplaced fracture. 2. Nonspecific lucency at the lateral aspect of the first metatarsal head, unchanged. 3. Prominent diffuse degenerative changes. 4. Prior resection involving the fourth and fifth rays. Electronically Signed   By: Burman Nieves M.D.   On: 07/29/2023 17:20    Procedures Procedures    Medications Ordered in ED Medications  potassium chloride 10 mEq in 100 mL IVPB (0 mEq Intravenous Stopped 07/29/23 1830)  piperacillin-tazobactam (ZOSYN) IVPB 3.375 g (0 g Intravenous Stopped 07/29/23 1731)  vancomycin (VANCOCIN) IVPB 1000 mg/200 mL premix (0 mg Intravenous Stopped 07/29/23 1843)    Followed by  vancomycin (VANCOCIN) IVPB 1000 mg/200 mL premix (1,000 mg Intravenous New Bag/Given 07/29/23 1825)    ED Course/ Medical Decision Making/ A&P                                 Medical Decision Making Amount and/or Complexity of Data  Reviewed Labs: ordered. Radiology: ordered.  Risk Prescription drug management. Decision regarding hospitalization.   This patient presents to the ED for concern of foot pain.  Differential diagnosis includes osteomyelitis,   Lab Tests:  I Ordered, and  personally interpreted labs.  The pertinent results include: CBC with hemoglobin at 12.7, CMP shows hypokalemia 2.7 possibly due to GI loss as he does report some vomiting earlier today, lactic acid normal at 1.8, magnesium normal at 1.9, aerobic culture of wound collected and pending, blood cultures collected and pending   Imaging Studies ordered:  I ordered imaging studies including x-ray left foot I independently visualized and interpreted imaging which showed . Soft tissue defect over the base of the fifth metatarsal with underlying cortical erosion, possibly osteomyelitis, and with linear lucency possibly indicating nondisplaced fracture. 2. Nonspecific lucency at the lateral aspect of the first metatarsal head, unchanged. 3. Prominent diffuse degenerative changes. 4. Prior resection involving the fourth and fifth rays. I agree with the radiologist interpretation   Medicines ordered and prescription drug management:  I ordered medication including vancomycin, Zosyn, potassium chloride for osteomyelitis, hypokalemia Reevaluation of the patient after these medicines showed that the patient improved I have reviewed the patients home medicines and have made adjustments as needed   Problem List / ED Course:  Patient with past history significant for diabetes, osteomyelitis presents to the emergency department concerns of foot pain.  Reports that symptoms began about 2 to 3 months ago in the left foot.  History of prior osteomyelitis in his foot.  Has had multiple revisions and amputations in this area due to complications of his procedures.  States that he is having worsening pain, erythema, and some drainage in this area.  Reports  that he was seen at [facility] ED and presented to be seen at [facility], ED yesterday but due to the wait, he left without being seen.  Patient was followed up by Dr. Logan Bores with [facility] this morning who advised patient to come to the emergency department valuation.  Patient typically follows with Dr. Ardelle Anton for his foot concerns and was advised to seek evaluation with the office.  X-ray results not available at this time for imaging performed in office. Exam reveals a minimal weeping sore to the left lateral foot. Notable erythema in the surrounding tissue. Will consult with podiatry but placed osteomyelitis order set. Spoke with Dr. Annamary Rummage regarding patient and he is aware of his case. Advised medical admission for podiatry evaluation. Suggested starting antibiotic therapy on patient as well. Consult to hospitalist placed. Spoke with Dr. Elvera Lennox, hospitalist, who will be admitting patient.   Final Clinical Impression(s) / ED Diagnoses Final diagnoses:  Osteomyelitis of left foot, unspecified type (HCC)  Hypokalemia    Rx / DC Orders ED Discharge Orders     None         Salomon Mast 07/29/23 2126    Pricilla Loveless, MD 07/31/23 [phone]

## 2023-07-29 NOTE — Progress Notes (Signed)
 RN offered food and drinks to the pt. CBG prior to eating was 269. Meds were given per doctors order. Pt apologized about his inappropriate behavior towards this RN. Pt has finally calmed down and resting comfortably on his bed.

## 2023-07-29 NOTE — Telephone Encounter (Signed)
 Pt called back because he had not heard from [place] since leaving the message this morning. HE asked if we could just direct admit him to the hospital and I explained we could not as we have not seen pt in office for condition. I did see where Dr Ardelle Anton pt the note for pt to come in this week and I offered him tomorrow morning at 815 but pt is insisting to be seen today. He is worried that his foot is getting more infection in it while waiting.  I talked with anabell and pt is coming in to see Dr Logan Bores today. He was getting dressed and will be on his way.

## 2023-07-29 NOTE — Telephone Encounter (Signed)
 Patient could not get admitted at ER last night.  Need advice on next steps for infection in foot.

## 2023-07-29 NOTE — Telephone Encounter (Signed)
 Pt left message today at 734am stating he was told to go to the ed yesterday and he did and it was packed.He tried waiting and he could just could not wait. His left foot is still the same and he is asking what he needed to do as he cannot go to the ed and wait again.

## 2023-07-29 NOTE — H&P (Signed)
 History and Physical    [name] RUE:[id number] DOB: 12-01-1965 DOA: 07/29/2023  PCP: [name], MD   Chief Complaint:  foot pain  HPI: [name] is a 58 y.o. male with medical history significant of ADHD, depression, type 2 diabetes, hypertension, EMEA who presented to the emergency department due to osteomyelitis.  Patient's been having foot pain on left foot.  Has had a wound for the last several months but has not quite healed.  He developed worsening ulceration and drainage so he presented to [facility] for further assessment.  On arrival he was afebrile and hemodynamically stable.  He had a notable ulceration on his lateral left foot with clear drainage.  Labs were obtained on presentation which demonstrated potassium 2.7, glucose 53, WBC 10.2, hemoglobin 12.7, platelets 303, lactic acid 1.8, magnesium 1.9, glucose 269.    Review of Systems: Review of Systems  Constitutional:  Negative for chills and fever.  HENT: Negative.    Eyes: Negative.   Respiratory: Negative.    Cardiovascular: Negative.   Genitourinary: Negative.   Musculoskeletal: Negative.   Skin: Negative.  Negative for itching and rash.  Neurological: Negative.   Psychiatric/Behavioral: Negative.       As per HPI otherwise 10 point review of systems negative.   Allergies  Allergen Reactions   Lithium Other (See Comments)    "Made me feel like I was walking in chest-deep water. I didn't like it."   Adderall [Amphetamine-Dextroamphetamine] Palpitations and Other (See Comments)    Hyperactivity, also   Ritalin [Methylphenidate] Palpitations and Other (See Comments)    Hyperactivity, also    Past Medical History:  Diagnosis Date   ADHD (attention deficit hyperactivity disorder)    Anxiety and depression    Candida esophagitis (HCC)    in setting of DKA/hyperglycemia   CIDP (chronic inflammatory demyelinating polyneuropathy) (HCC)    Depression    Diabetes type 2, controlled  (HCC)    Diverticulosis    Essential hypertension 06/21/2014   Hyperlipidemia    Kidney stones    Pancreatitis    Stomach ulcer    Vitamin D deficiency     Past Surgical History:  Procedure Laterality Date   ANKLE SURGERY     CERVICAL DISC SURGERY     C4-C5   ELBOW SURGERY     ESOPHAGOGASTRODUODENOSCOPY N/A 07/16/2017   Procedure: ESOPHAGOGASTRODUODENOSCOPY (EGD);  Surgeon: [name], MD;  Location[facility] ENDOSCOPY;  Service: Endoscopy;  Laterality: N/A;   RETINAL DETACHMENT SURGERY     RHINOPLASTY     VASECTOMY     WOUND DEBRIDEMENT Left 03/27/2018   Procedure: DEBRIDEMENT WOUND;  Surgeon: Vivi Barrack, DPM;  Location: [facility] OR;  Service: Podiatry;  Laterality: Left;     reports that he has quit smoking. He has quit using smokeless tobacco. He reports current alcohol use. He reports that he does not use drugs.  Family History  Problem Relation Age of Onset   Stroke Father    Breast cancer Mother    Colon cancer Neg Hx     Prior to Admission medications   Medication Sig Start Date End Date Taking? Authorizing Provider  acetaminophen (TYLENOL) 325 MG tablet Take 650 mg by mouth every 6 (six) hours as needed for mild pain or headache.    [provider]  amoxicillin-clavulanate (AUGMENTIN) 875-125 MG tablet Take 1 tablet by mouth 2 (two) times daily. 06/05/22   Vivi Barrack, DPM  amoxicillin-clavulanate (AUGMENTIN) 875-125 MG tablet Take  1 tablet by mouth 2 (two) times daily. 08/21/22   Vivi Barrack, DPM  Amphetamine Sulfate 10 MG TABS Take 20 mg by mouth 2 (two) times daily.     [provider]  aspirin 81 MG tablet Take 1 tablet (81 mg total) by mouth daily. 07/17/17   Berton Mount I, MD  atorvastatin (LIPITOR) 10 MG tablet Take 10 mg by mouth daily. 04/03/20   [provider]  buPROPion (WELLBUTRIN XL) 150 MG 24 hr tablet Take 150 mg by mouth daily. 04/01/18   [provider]  calcium carbonate (TUMS - DOSED IN MG  ELEMENTAL CALCIUM) 500 MG chewable tablet Chew 1 tablet by mouth daily as needed for indigestion.    [provider]  cephALEXin (KEFLEX) 500 MG capsule Take 1 capsule (500 mg total) by mouth 4 (four) times daily. 10/09/22   Vivi Barrack, DPM  Continuous Blood Gluc Receiver (FREESTYLE LIBRE 14 DAY READER) DEVI every 14 (fourteen) days.  01/05/18   [provider]  Continuous Blood Gluc Sensor (FREESTYLE LIBRE 14 DAY SENSOR) MISC  04/14/18   [provider]  dicyclomine (BENTYL) 20 MG tablet Take 1 tablet (20 mg total) by mouth 2 (two) times daily. 02/05/21   Garlon Hatchet, PA-C  DULoxetine (CYMBALTA) 60 MG capsule Take 60 mg by mouth daily. 02/26/18   [provider]  ergocalciferol (VITAMIN D2) 1.25 MG (50000 UT) capsule Take 50,000 Units by mouth once a week. 08/13/16   [provider]  fenofibrate 160 MG tablet Take 160 mg by mouth daily.    [provider]  fluconazole (DIFLUCAN) 100 MG tablet Take 2 tablets by mouth on day 1 then take 1 tablet daily until gone 07/22/17   [name], MD  gabapentin (NEURONTIN) 800 MG tablet Take 800 mg by mouth 3 (three) times daily.    [provider]  gentamicin cream (GARAMYCIN) 0.1 % Apply 1 application topically in the morning and at bedtime. 11/16/20   Vivi Barrack, DPM  HYDROcodone-acetaminophen (NORCO/VICODIN) 5-325 MG tablet Take 1-2 tablets by mouth every 6 (six) hours as needed. 01/23/21   Vivi Barrack, DPM  insulin aspart (NOVOLOG) 100 UNIT/ML FlexPen Inject 22-32 Units into the skin See admin instructions. Inject 22-32 units into the skin three to four times a day before meals, per sliding scale    [provider]  insulin NPH-regular Human (NOVOLIN 70/30) (70-30) 100 UNIT/ML injection Insulin NPH 40 units in am and 30 Units at night time. 07/16/17   Barnetta Chapel, MD  ketorolac (TORADOL) 10 MG tablet Take 1 tablet (10 mg total) by mouth every 6 (six) hours as  needed for moderate pain. 02/28/22   Lurline Idol, FNP  lamoTRIgine (LAMICTAL) 150 MG tablet Take 150 mg by mouth 2 (two) times daily. 03/02/20   [provider]  ondansetron (ZOFRAN ODT) 4 MG disintegrating tablet Take 1 tablet (4 mg total) by mouth every 8 (eight) hours as needed for nausea. 02/05/21   Garlon Hatchet, PA-C  ondansetron (ZOFRAN) 4 MG tablet Take 1 tablet (4 mg total) by mouth every 8 (eight) hours as needed for nausea or vomiting. 01/23/21   Vivi Barrack, DPM  pantoprazole (PROTONIX) 40 MG tablet Take 1 tablet (40 mg total) by mouth daily before breakfast. 07/17/17   Barnetta Chapel, MD  promethazine (PHENERGAN) 25 MG tablet Take 1 tablet (25 mg total) by mouth every 8 (eight) hours as needed for nausea or  vomiting. 01/11/20   Vivi Barrack, DPM  ramipril (ALTACE) 2.5 MG capsule Take 2.5 mg by mouth daily. 05/06/20   [provider]  SEMGLEE, YFGN, 100 UNIT/ML Pen Inject 80 Units into the skin at bedtime. 01/07/21   [provider]  sildenafil (REVATIO) 20 MG tablet Take 60 mg by mouth daily as needed (ED). 08/20/18   [provider]  silver sulfADIAZINE (SILVADENE) 1 % cream Apply 1 Application topically daily. 08/21/22   Vivi Barrack, DPM  TECHLITE PEN NEEDLES 31G X 8 MM MISC USE TO GIVE INSULIN 4 TIMES DAILY 09/01/19   [provider]  zolpidem (AMBIEN CR) 6.25 MG CR tablet Take 6.25 mg by mouth at bedtime. 02/03/21   [provider]    Physical Exam: Vitals:   07/29/23 1424 07/29/23 1600 07/29/23 1824 07/29/23 2044  BP: 120/84 (!) 157/90  (!) 142/88  Pulse: 92 88    Resp: 18 16  20   Temp: 98.4 F (36.9 C)  99 F (37.2 C) 98.2 F (36.8 C)  TempSrc:   Oral Oral  SpO2: 100% 100%  100%   Physical Exam Vitals reviewed.  Constitutional:      Appearance: He is normal weight.  HENT:     Head: Normocephalic.     Nose: Nose normal.     Mouth/Throat:     Mouth: Mucous membranes are moist.     Pharynx:  Oropharynx is clear.  Eyes:     Pupils: Pupils are equal, round, and reactive to light.  Cardiovascular:     Rate and Rhythm: Normal rate and regular rhythm.     Pulses: Normal pulses.     Heart sounds: Normal heart sounds.  Pulmonary:     Effort: Pulmonary effort is normal.     Breath sounds: Normal breath sounds.  Abdominal:     General: Abdomen is flat. Bowel sounds are normal.  Musculoskeletal:        General: Normal range of motion.     Cervical back: Normal range of motion.  Skin:    General: Skin is warm.     Capillary Refill: Capillary refill takes less than 2 seconds.  Neurological:     General: No focal deficit present.     Mental Status: He is alert.      Labs on Admission: I have personally reviewed the patients's labs and imaging studies.  Assessment/Plan Principal Problem:   Osteomyelitis (HCC)   # Diabetic foot infection of left foot concerning for osteomyelitis - Patient has drainage from lateral foot wounds - History of poorly controlled type 2 diabetes  Plan: Obtain MR foot Start vancomycin and Zosyn ABIs  # Type 2 diabetes-placed on basal-bolus regimen   # Hyperlipidemia-continue fibrate, Lipitor  # CAD-continue aspirin  # Psychiatric illness-patient is is on regimen of lumateperone, venlafaxine  # Chronic pain-continue gabapentin  # GERD-continue Protonix  # Hypertension-continue ramipril   Admission status: Inpatient Telemetry Medical  Certification: The appropriate patient status for this patient is INPATIENT. Inpatient status is judged to be reasonable and necessary in order to provide the required intensity of service to ensure the patient's safety. The patient's presenting symptoms, physical exam findings, and initial radiographic and laboratory data in the context of their chronic comorbidities is felt to place them at high risk for further clinical deterioration. Furthermore, it is not anticipated that the patient will be medically  stable for discharge from the hospital within 2 midnights of admission.   * I certify  that at the point of admission it is my clinical judgment that the patient will require inpatient hospital care spanning beyond 2 midnights from the point of admission due to high intensity of service, high risk for further deterioration and high frequency of surveillance required.Alan Mulder MD [facility] If 7PM-7AM, please contact night-coverage [email or web address]  07/29/2023, 10:56 PM

## 2023-07-29 NOTE — Progress Notes (Signed)
 Pt walked at front desk, upset and confronted this RN. "If you can not give me proper treatment here, I am ready to leave and go home. I can take tylenol and have something to eat there. I'm in pain and have not eaten since 2:30pm!. Do you want me to go in coma knowing I have Type 1 DM!? You can't even give me tylenol for pain! I'm in pain.  Pt assisted back to his room with his knee walker. Pillow, gown and belonging bags were thrown on the floor.RN told the pt that I can provide food with no diet order yet even if it's against me but told him that I can't give any meds without doctor's order. Pt started crying.

## 2023-07-29 NOTE — Progress Notes (Signed)
 Admitting consult was paged for attending and admission orders.

## 2023-07-29 NOTE — ED Triage Notes (Signed)
 C/o left foot pain. Recently dx of osteomyelitis in foot. Followed by [facility]. Was sent by podiatrists for admissions. Denies fevers but endorse weakness and body aches.

## 2023-07-29 NOTE — Progress Notes (Signed)
 Pharmacy Antibiotic Note  [name] is a 58 y.o. male admitted on 07/29/2023 with foot pain with concerns for cellulitis.  Pharmacy has been consulted for Zosyn/vancomycin dosing. PMH: type 2 diabetes and osteomyelitis.  -CT with possible osteomyelitis -Vanc and Zosyn x1 in ED -11/2020 Wound Culture: MRSA  Plan: -Zosyn 3.375gm IV every 8 hours -Vancomycin 2g IV x1 -Vancomycin 1250mg  IV every 12 hours (AUC 456 , Vd 0.72, IBW) -Monitor renal function -Follow up signs of clinical improvement, LOT, de-escalation of antibiotics   Height: 6\' 4"  (193 cm) Weight: 94.8 kg (209 lb) IBW/kg (Calculated) : 86.8  Temp (24hrs), Avg:98.5 F (36.9 C), Min:98.2 F (36.8 C), Max:99 F (37.2 C)  Recent Labs  Lab 07/29/23 1518  WBC 10.2  CREATININE 1.07  LATICACIDVEN 1.8    Estimated Creatinine Clearance: 91.3 mL/min (by C-G formula based on SCr of 1.07 mg/dL).    Allergies  Allergen Reactions   Lithium Other (See Comments)    "Made me feel like I was walking in chest-deep water. I didn't like it."   Adderall [Amphetamine-Dextroamphetamine] Palpitations and Other (See Comments)    Hyperactivity, also   Ritalin [Methylphenidate] Palpitations and Other (See Comments)    Hyperactivity, also    Antimicrobials this admission: Zosyn 2/26 >>  Vancomycin 2/26 >>   Microbiology results: 2/26 BCx:  2/26 Wound Cx:   Thank you for allowing pharmacy to be a part of this patient's care.  Arabella Merles, PharmD. Clinical Pharmacist 07/29/2023 11:50 PM

## 2023-07-29 NOTE — ED Notes (Signed)
 [facility] at bedside

## 2023-07-29 NOTE — ED Notes (Signed)
 Called [facility] to transport patient to [facility] rm# [id number]

## 2023-07-29 NOTE — Progress Notes (Addendum)
 Pt called at front and started cursing. "F.U, F.U, F.U all! Don't try to call anyone! I'm leaving!"

## 2023-07-30 ENCOUNTER — Inpatient Hospital Stay (HOSPITAL_COMMUNITY): Payer: Medicaid Other

## 2023-07-30 ENCOUNTER — Telehealth (HOSPITAL_COMMUNITY): Payer: Self-pay | Admitting: Pharmacy Technician

## 2023-07-30 ENCOUNTER — Other Ambulatory Visit (HOSPITAL_COMMUNITY): Payer: Self-pay

## 2023-07-30 DIAGNOSIS — G629 Polyneuropathy, unspecified: Secondary | ICD-10-CM

## 2023-07-30 DIAGNOSIS — L98499 Non-pressure chronic ulcer of skin of other sites with unspecified severity: Secondary | ICD-10-CM | POA: Diagnosis not present

## 2023-07-30 DIAGNOSIS — E1142 Type 2 diabetes mellitus with diabetic polyneuropathy: Secondary | ICD-10-CM

## 2023-07-30 DIAGNOSIS — E876 Hypokalemia: Secondary | ICD-10-CM

## 2023-07-30 DIAGNOSIS — I10 Essential (primary) hypertension: Secondary | ICD-10-CM

## 2023-07-30 DIAGNOSIS — M86172 Other acute osteomyelitis, left ankle and foot: Secondary | ICD-10-CM

## 2023-07-30 DIAGNOSIS — G894 Chronic pain syndrome: Secondary | ICD-10-CM | POA: Diagnosis not present

## 2023-07-30 DIAGNOSIS — M868X7 Other osteomyelitis, ankle and foot: Secondary | ICD-10-CM | POA: Diagnosis not present

## 2023-07-30 DIAGNOSIS — K2100 Gastro-esophageal reflux disease with esophagitis, without bleeding: Secondary | ICD-10-CM

## 2023-07-30 LAB — BASIC METABOLIC PANEL
Anion gap: 11 (ref 5–15)
BUN: 7 mg/dL (ref 6–20)
CO2: 24 mmol/L (ref 22–32)
Calcium: 8.9 mg/dL (ref 8.9–10.3)
Chloride: 100 mmol/L (ref 98–111)
Creatinine, Ser: 1.02 mg/dL (ref 0.61–1.24)
GFR, Estimated: >60 mL/min (ref >60)
Glucose, Bld: 234 mg/dL — ABNORMAL HIGH (ref 70–99)
Potassium: 3.9 mmol/L (ref 3.5–5.1)
Sodium: 135 mmol/L (ref 135–145)

## 2023-07-30 LAB — CBC WITH DIFFERENTIAL/PLATELET
Abs Immature Granulocytes: 0.02 10*3/uL (ref 0.00–0.07)
Basophils Absolute: 0.1 10*3/uL (ref 0.0–0.1)
Basophils Relative: 1 %
Eosinophils Absolute: 0.2 10*3/uL (ref 0.0–0.5)
Eosinophils Relative: 2 %
HCT: 31.1 % — ABNORMAL LOW (ref 39.0–52.0)
Hemoglobin: 11.1 g/dL — ABNORMAL LOW (ref 13.0–17.0)
Immature Granulocytes: 0 %
Lymphocytes Relative: 21 %
Lymphs Abs: 1.6 10*3/uL (ref 0.7–4.0)
MCH: 29.4 pg (ref 26.0–34.0)
MCHC: 35.7 g/dL (ref 30.0–36.0)
MCV: 82.3 fL (ref 80.0–100.0)
Monocytes Absolute: 0.9 10*3/uL (ref 0.1–1.0)
Monocytes Relative: 12 %
Neutro Abs: 4.7 10*3/uL (ref 1.7–7.7)
Neutrophils Relative %: 64 %
Platelets: 218 10*3/uL (ref 150–400)
RBC: 3.78 MIL/uL — ABNORMAL LOW (ref 4.22–5.81)
RDW: 13.5 % (ref 11.5–15.5)
WBC: 7.4 10*3/uL (ref 4.0–10.5)
nRBC: 0 % (ref 0.0–0.2)

## 2023-07-30 LAB — GLUCOSE, CAPILLARY
Glucose-Capillary: 220 mg/dL — ABNORMAL HIGH (ref 70–99)
Glucose-Capillary: 298 mg/dL — ABNORMAL HIGH (ref 70–99)
Glucose-Capillary: 99 mg/dL (ref 70–99)
Glucose-Capillary: 200 mg/dL — ABNORMAL HIGH (ref 70–99)

## 2023-07-30 LAB — HEMOGLOBIN A1C
Hgb A1c MFr Bld: 10.9 % — ABNORMAL HIGH (ref 4.8–5.6)
Mean Plasma Glucose: 266.13 mg/dL

## 2023-07-30 LAB — MAGNESIUM: Magnesium: 1.7 mg/dL (ref 1.7–2.4)

## 2023-07-30 MED ORDER — SODIUM CHLORIDE 0.9 % IV SOLN
3.0000 g | Freq: Four times a day (QID) | INTRAVENOUS | Status: DC
  Administered 2023-07-30 – 2023-08-01 (×8): 3 g via INTRAVENOUS
  Filled 2023-07-30 (×7): qty 8

## 2023-07-30 MED ORDER — SODIUM CHLORIDE 0.9 % IV SOLN: INTRAVENOUS | Status: DC

## 2023-07-30 MED ORDER — MAGNESIUM SULFATE 2 GM/50ML IV SOLN
2.0000 g | Freq: Once | INTRAVENOUS | Status: AC
  Administered 2023-07-30: 2 g via INTRAVENOUS
  Filled 2023-07-30: qty 50

## 2023-07-30 MED ORDER — GADOBUTROL 1 MMOL/ML IV SOLN
10.0000 mL | Freq: Once | INTRAVENOUS | Status: AC | PRN
  Administered 2023-07-30: 10 mL via INTRAVENOUS

## 2023-07-30 MED ORDER — LIVING WELL WITH DIABETES BOOK
Freq: Once | Status: AC
  Filled 2023-07-30: qty 1

## 2023-07-30 NOTE — Inpatient Diabetes Management (Signed)
 Inpatient Diabetes Program Recommendations  AACE/ADA: New Consensus Statement on Inpatient Glycemic Control (2015)  Target Ranges:  Prepandial:   less than 140 mg/dL      Peak postprandial:   less than 180 mg/dL (1-2 hours)      Critically ill patients:  140 - 180 mg/dL   Lab Results  Component Value Date   GLUCAP 298 (H) 07/30/2023   HGBA1C 10.9 (H) 07/30/2023    Latest Reference Range & Units 07/29/23 20:50 07/30/23 08:12 07/30/23 11:39  Glucose-Capillary 70 - 99 mg/dL 096 (H) 045 (H) 409 (H)  (H): Data is abnormally high  Diabetes history: DM 1.5 per Dr. Ocie Cornfield, endocrinologist Outpatient Diabetes medications: Novolog 18 units tid (+1 unit/50 >250), Lantus 80 units daily (has not taken for > 1 year Current orders for Inpatient glycemic control: Lantus 40 units daily, Novolog 0-24 units tid with meals correction  Inpatient Diabetes Program Recommendations:   Spoke with patient to clarify what he has been taking @ home. Patient's insurance changed and preferred insulin changed and also unable to see Dr. Roanna Raider for > 1 year. Now patient had Medicaid and has an appointment with Dr. Roanna Raider on soon.  Patient now has prior authorization completed by Roland Earl, pharmacy and will give patient sample to take home to start. D/C recommendations placed and can be modified. Ordered LWWD booklet.  Thank you, Billy Fischer. Debra Colon, RN, MSN, CDCES  Diabetes Coordinator Inpatient Glycemic Control Team Team Pager [phone] (8am-5pm) 07/30/2023 1:01 PM

## 2023-07-30 NOTE — Telephone Encounter (Signed)
 Pharmacy Patient Advocate Encounter  Received notification from [facility] that Prior Authorization for FreeStyle Libre 3 Sensor has been APPROVED from 07/30/2023 to 01/26/2024. Ran test claim, Copay is $0.00. This test claim was processed through [facility]- copay amounts may vary at other pharmacies due to pharmacy/plan contracts, or as the patient moves through the different stages of their insurance plan.   PA #/Case ID/Reference #: [id number]  Key: [id number]

## 2023-07-30 NOTE — Consult Note (Signed)
 PODIATRY CONSULTATION  NAME TREYLIN [name] MRN [id number] DOB 07-Apr-1966 DOA 07/29/2023   Reason for consult: Left foot ulceration  Attending/Consulting physician: D. [name] MD  History of present illness: "[name] is a 58 y.o. male with medical history significant of ADHD, depression, type 2 diabetes, hypertension, EMEA who presented to the emergency department due to osteomyelitis.  Patient's been having foot pain on left foot.  Has had a wound for the last several months but has not quite healed.  He developed worsening ulceration and drainage so he presented to [facility] for [facility] assessment.  On arrival he was afebrile and hemodynamically stable.  He had a notable ulceration on his lateral left foot with clear drainage.  Labs were obtained on presentation which demonstrated potassium 2.7, glucose 53, WBC 10.2, hemoglobin 12.7, platelets 303, lactic acid 1.8, magnesium 1.9, glucose 269. "  Patient well-known to Dr. Loreta Ave.  Has not been seen in the office since early last year.  Has been going to wound care for wound on the left foot.  Says it is recently gotten worse more red swollen and draining.  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   Past Medical History:  Diagnosis Date   ADHD (attention deficit hyperactivity disorder)    Anxiety and depression    Candida esophagitis (HCC)    in setting of DKA/hyperglycemia   CIDP (chronic inflammatory demyelinating polyneuropathy) (HCC)    Depression    Diabetes type 2, controlled (HCC)    Diverticulosis    Essential hypertension 06/21/2014   Hyperlipidemia    Kidney stones    Pancreatitis    Stomach ulcer    Vitamin D deficiency        Latest Ref Rng & Units 07/30/2023    8:37 AM 07/29/2023    3:18 PM 08/06/2021   11:40 AM  CBC  WBC 4.0 - 10.5 K/uL 7.4  10.2  6.3   Hemoglobin 13.0 - 17.0 g/dL 62.1  30.8  65.7   Hematocrit 39.0 - 52.0 % 31.1  36.6  44.6    Platelets 150 - 400 K/uL 218  303  222        Latest Ref Rng & Units 07/30/2023    8:37 AM 07/29/2023    3:18 PM 08/06/2021   11:40 AM  BMP  Glucose 70 - 99 mg/dL 846  53  962   BUN 6 - 20 mg/dL 7  13  10    Creatinine 0.61 - 1.24 mg/dL 9.52  8.41  3.24   BUN/Creat Ratio 9 - 20   9   Sodium 135 - 145 mmol/L 135  137  136   Potassium 3.5 - 5.1 mmol/L 3.9  2.7  5.0   Chloride 98 - 111 mmol/L 100  99  99   CO2 22 - 32 mmol/L 24  28  24    Calcium 8.9 - 10.3 mg/dL 8.9  40.1  9.8       Physical Exam: Lower Extremity Exam Vasc: R - PT palpable, DP palpable. Cap refill < 3 sec to digits  L - PT palpable, DP palpable. Cap refill <3 sec to digits  Derm: R - Normal temp/texture/turgor with no open lesion or clinical signs of infection   L -ulceration plantar lateral aspect of the left midfoot at the site of the fifth metatarsal base with concern for draining sinus tract.  Wound does probe down to bone.  Surrounding erythema.  MSK:  R -  No gross deformities. Compartments soft, non-tender, compressible  L -varus deformity noted of the left foot.  Prior partial fifth metatarsal resection distally.  Prior partial fourth ray amputation.  Neuro: R - Gross sensation absent. Gross motor function intact   L - Gross sensation absent. Gross motor diminished    ASSESSMENT/PLAN OF CARE 57 y.o. male with PMHx significant for ADHD depression type II DM with neuropathy varus foot deformity hypertension with osteomyelitis of the residual fifth metatarsal base with draining ulceration.  DM2 is uncontrolled  A1c 10.9 WBC 7.4 MRI left foot: Acute osteomyelitis of residual fifth metatarsal small fourth and fifth TMT fusion which may reflect reactive or reflect septic arthritis bone marrow edema within the proximal phalanx of the third toe possible nondisplaced fracture  -N.p.o. past midnight for tomorrow for left fifth metatarsal base resection.  Discussed this with the patient he is in agreement.  He  understands possible need for wound VAC.  - Continue IV abx broad spectrum pending further culture data - Anticoagulation: Please hold pending the OR tomorrow.  I discontinued Lovenox preoperatively to prevent accidental administration. - Wound care: Please wrap left foot with gauze and Kerlix roll preoperatively - WB status: Weightbearing as tolerated left foot - Will continue to follow   Thank you for the consult.  Please contact me directly with any questions or concerns.           Corinna Gab, DPM [facility]    [address] [place], [place] [address]                Office [phone]  Fax [phone]

## 2023-07-30 NOTE — Consult Note (Signed)
 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  Seen at wound care center.  Longstanding history with podiatry/wound center.  Is on Antibiotiotic therapy and awaiting podiatry consult.  Wound type: infectious, neuropathic Pressure Injury POA: NA Measurement: erythema to left lateral foot with 0.5 cm nonintact lesion with calloused periwound Wound ZOX:WRUEAV to assess Drainage (amount, consistency, odor) minimal serosanguinous  no odor Periwound: erythema, tenderness and edema Dressing procedure/placement/frequency: Cleanse left lateral foot wound with VASHE cleanser.  [facility] # [id number]) Cover with foam dressing. Change every other day.  Please date dressing.  Will not follow at this time.  Please re-consult if needed.  Mike Gip MSN, RN, FNP-BC CWON Wound, Ostomy, Continence Nurse Outpatient [facility] [phone] Pager [phone]

## 2023-07-30 NOTE — Progress Notes (Signed)
 VASCULAR LAB    ABI has been performed.  See CV proc for preliminary results.   Ayoub Arey, RVT 07/30/2023, 1:02 PM

## 2023-07-30 NOTE — Inpatient Diabetes Management (Signed)
 Inpatient Diabetes Program Recommendations  AACE/ADA: New Consensus Statement on Inpatient Glycemic Control (2015)  Target Ranges:  Prepandial:   less than 140 mg/dL      Peak postprandial:   less than 180 mg/dL (1-2 hours)      Critically ill patients:  140 - 180 mg/dL   Lab Results  Component Value Date   GLUCAP 298 (H) 07/30/2023   HGBA1C 10.9 (H) 07/30/2023   Discharge Recommendations: Other recommendations: Libre 3 plus sensors [id number] Long acting recommendations: Insulin Glargine (LANTUS) Solostar Pen 80 units daily  Short acting recommendations:  Meal + Correction coverage Insulin aspart (NOVOLOG) FlexPen  Sensitive Scale.  18 units tid meal coverage Hypoglycemia treatment recommendations: GVoke 1mg  Supply/Referral recommendations: Glucometer Test strips Lancet device Lancets Pen needles - standard   Use Adult Diabetes Insulin Treatment Post Discharge order set.  Thank you, Billy Fischer. Nahshon Reich, RN, MSN, CDCES  Diabetes Coordinator Inpatient Glycemic Control Team Team Pager [phone] (8am-5pm) 07/30/2023 1:05 PM

## 2023-07-30 NOTE — Progress Notes (Addendum)
 PROGRESS NOTE    [name]  ZOX:[id number] DOB: April 08, 1966 DOA: 07/29/2023 PCP: [name], MD    Chief Complaint  Patient presents with   Foot Pain    Brief Narrative:  Patient is a 58 year old gentleman history of ADHD, depression, type 2 diabetes, hypertension presented to the ED due to concerns for osteomyelitis.  Patient with left foot pain has been followed at the wound care center for several months but no significant healing.  Patient developed worsening ulceration and drainage [facility].  Patient also noted to be hypokalemic.  Patient admitted for further evaluation for osteomyelitis.  Podiatry consulted.   Assessment & Plan:   Principal Problem:   Osteomyelitis (HCC) Active Problems:   Chronic pain syndrome   Diabetic peripheral neuropathy associated with type 2 diabetes mellitus (HCC)   Essential hypertension   Neuropathy   Gastroesophageal reflux disease with esophagitis without hemorrhage   Hypokalemia   1 osteomyelitis of left residual fifth metatarsal base with draining ulceration -Patient noted to have presented to the ED with worsening left lower extremity wound with worsening ulceration and drainage with notable ulceration of the lateral left foot. -Patient with poorly controlled type 2 diabetes. -Plain films of the left foot with concern for osteomyelitis. -MRI of the left foot with acute osteomyelitis of the residual fifth metatarsal, small fourth and fifth TMT joint effusion which may be reactive or reflect septic arthritis. -Superficial cultures obtained in ED with and an Enterobacter species with sensitivities pending. -Bilateral ABI normal. -Continue IV vancomycin. -Discontinue IV Zosyn and placed on IV Unasyn. -Patient seen in consultation by podiatry and patient for left fifth metatarsal base resection tomorrow per podiatry. -Continue current wound care recommendations per podiatry. -WBAT  left foot. -Postop and once cultures have resulted will need ID input for antibiotic duration and recommendations. -Per orthopedics.  2.  Poorly controlled type 2 diabetes mellitus -Hemoglobin A1c 10.9 (07/30/2023). -CBG 220 this morning. -Patient states has not had his insulin in several months due to insurance issues and being unable to follow-up with his endocrinologist. -Increase Lantus to 40 units daily, continue SSI, continue Neurontin.  3.  Hyperlipidemia -Continue fenofibrate, Lipitor.  4.  CAD -Continue aspirin, Lipitor, fenofibrate, ramipril.  5.  Chronic pain -Continue gabapentin.  6.  GERD -PPI.  7.  Hypertension -Continue ramipril.  8.  Hypokalemia -Magnesium at 1.7. -Potassium currently at 3.9 from 2.7 on admission. -Magnesium sulfate 2 g IV x 1. -Repeat labs in the AM.  DVT prophylaxis: None in anticipation of surgery tomorrow, postop DVT prophylaxis per podiatry. Code Status: Full Family Communication: Updated patient.  No family at bedside. Disposition: TBD  Status is: Inpatient Remains inpatient appropriate because: Severity of illness   Consultants:  Podiatry: Dr. Annamary Rummage 07/30/2023 Wound care RN 07/30/2023  Procedures:  Plain films of the left foot 07/29/2023 MRI left foot 07/30/2023 ABI with TBI 07/30/2023  Antimicrobials:  Anti-infectives (From admission, onward)    Start     Dose/Rate Route Frequency Ordered Stop   07/30/23 1800  Ampicillin-Sulbactam (UNASYN) 3 g in sodium chloride 0.9 % 100 mL IVPB        3 g 200 mL/hr over 30 Minutes Intravenous Every 6 hours 07/30/23 0829     07/30/23 0600  vancomycin (VANCOREADY) IVPB 1250 mg/250 mL        1,250 mg 166.7 mL/hr over 90 Minutes Intravenous Every 12 hours 07/29/23 2352     07/30/23 0100  piperacillin-tazobactam (ZOSYN) IVPB  3.375 g  Status:  Discontinued        3.375 g 12.5 mL/hr over 240 Minutes Intravenous Every 8 hours 07/29/23 2352 07/30/23 0829   07/29/23 1745  vancomycin  (VANCOCIN) IVPB 1000 mg/200 mL premix       Placed in "Followed by" Linked Group   1,000 mg 200 mL/hr over 60 Minutes Intravenous  Once 07/29/23 1630 07/29/23 1925   07/29/23 1630  vancomycin (VANCOCIN) IVPB 1000 mg/200 mL premix  Status:  Discontinued        1,000 mg 200 mL/hr over 60 Minutes Intravenous  Once 07/29/23 1623 07/29/23 1626   07/29/23 1630  piperacillin-tazobactam (ZOSYN) IVPB 3.375 g        3.375 g 100 mL/hr over 30 Minutes Intravenous  Once 07/29/23 1623 07/29/23 1731   07/29/23 1630  vancomycin (VANCOCIN) IVPB 1000 mg/200 mL premix       Placed in "Followed by" Linked Group   1,000 mg 200 mL/hr over 60 Minutes Intravenous  Once 07/29/23 1630 07/29/23 1843         Subjective: Patient laying in bed.  Denies any chest pain no shortness of breath.  No abdominal pain.  States left heel and left foot pain, swelling, erythema has improved after being placed on IV antibiotics.  Was wondering whether he would get his home medications.  Stated he saw podiatrist this morning and planning on surgery tomorrow.  States has been out of his long-acting insulin for several months due to insurance issues and being able to see his endocrinologist.  Objective: Vitals:   07/29/23 2044 07/30/23 0042 07/30/23 0953 07/30/23 1355  BP: (!) 142/88 102/68 125/81 (!) 141/86  Pulse:  84 80 87  Resp: 20 20  18   Temp: 98.2 F (36.8 C) 99.1 F (37.3 C) 98.4 F (36.9 C) 98.5 F (36.9 C)  TempSrc: Oral Oral Oral   SpO2: 100% 96% 98% 98%  Weight: 94.8 kg     Height: 6\' 4"  (1.93 m)       Intake/Output Summary (Last 24 hours) at 07/30/2023 1622 Last data filed at 07/30/2023 1141 Gross per 24 hour  Intake 360 ml  Output 400 ml  Net -40 ml   Filed Weights   07/29/23 2044  Weight: 94.8 kg    Examination:  General exam: Appears calm and comfortable  Respiratory system: Clear to auscultation. Respiratory effort normal. Cardiovascular system: S1 & S2 heard, RRR. No JVD, murmurs, rubs,  gallops or clicks. No pedal edema. Gastrointestinal system: Abdomen is nondistended, soft and nontender. No organomegaly or masses felt. Normal bowel sounds heard. Central nervous system: Alert and oriented. No focal neurological deficits. Extremities: Left foot with decreased erythema, no significant tenderness to palpation, status post fourth digit amputation. Skin: No rashes, lesions or ulcers Psychiatry: Judgement and insight appear normal. Mood & affect appropriate.     Data Reviewed: I have personally reviewed following labs and imaging studies  CBC: Recent Labs  Lab 07/29/23 1518 07/30/23 0837  WBC 10.2 7.4  NEUTROABS 7.0 4.7  HGB 12.7* 11.1*  HCT 36.6* 31.1*  MCV 83.0 82.3  PLT 303 218    Basic Metabolic Panel: Recent Labs  Lab 07/29/23 1518 07/30/23 0837  NA 137 135  K 2.7* 3.9  CL 99 100  CO2 28 24  GLUCOSE 53* 234*  BUN 13 7  CREATININE 1.07 1.02  CALCIUM 10.1 8.9  MG 1.9 1.7    GFR: Estimated Creatinine Clearance: 95.7 mL/min (by C-G formula based on  SCr of 1.02 mg/dL).  Liver Function Tests: Recent Labs  Lab 07/29/23 1518  AST 26  ALT 25  ALKPHOS 65  BILITOT 0.6  PROT 7.7  ALBUMIN 4.3    CBG: Recent Labs  Lab 07/29/23 2050 07/30/23 0812 07/30/23 1139  GLUCAP 269* 220* 298*     Recent Results (from the past 240 hours)  Blood Cultures x 2 sites     Status: None (Preliminary result)   Collection Time: 07/29/23  2:52 PM   Specimen: BLOOD  Result Value Ref Range Status   Specimen Description   Final    BLOOD BLOOD LEFT ARM Performed at [facility], [address], [place], [place] [address]    Special Requests   Final    Blood Culture adequate volume BOTTLES DRAWN AEROBIC AND ANAEROBIC Performed at [facility], [address], [place], [place] [address]    Culture   Final    NO GROWTH < 12 HOURS Performed at [facility], [address] [place], [place] [address]    Report Status  PENDING  Incomplete  Blood Cultures x 2 sites     Status: None (Preliminary result)   Collection Time: 07/29/23  2:57 PM   Specimen: BLOOD RIGHT ARM  Result Value Ref Range Status   Specimen Description   Final    BLOOD RIGHT ARM Performed at [facility], [address] [place], [place] [address]    Special Requests   Final    Blood Culture adequate volume BOTTLES DRAWN AEROBIC AND ANAEROBIC Performed at [facility], [address], [place], [place] [address]    Culture   Final    NO GROWTH < 12 HOURS Performed at [facility], [address] [place], [place] [address]    Report Status PENDING  Incomplete  Aerobic Culture w Gram Stain (superficial specimen)     Status: None (Preliminary result)   Collection Time: 07/29/23  4:00 PM   Specimen: Wound  Result Value Ref Range Status   Specimen Description   Final    WOUND Performed at [facility], [address] [place], [place] [address]    Special Requests   Final    NONE Performed at [facility], [address] [place], [place] [address]    Gram Stain NO WBC SEEN RARE GRAM POSITIVE RODS   Final   Culture   Final    ABUNDANT ENTEROBACTER SPECIES IDENTIFICATION AND SUSCEPTIBILITIES TO FOLLOW Performed at [facility], [address] [place], [place] [address]    Report Status PENDING  Incomplete         Radiology Studies: VAS [place] ABI WITH/WO TBI Result Date: 07/30/2023  LOWER EXTREMITY DOPPLER STUDY Patient Name:  DELVONTE BERENSON  Date of Exam:   07/30/2023 Medical Rec #: [id number]          Accession #:    [id number] Date of Birth: 1965-09-28         Patient Gender: M Patient Age:   27 years Exam Location:  [facility] Procedure:      VAS [place] ABI WITH/WO TBI Referring Phys: Alan Mulder --------------------------------------------------------------------------------  Indications: Ulceration. Osteomyelitis in the residual 5th  metatarsal High Risk         Hypertension, hyperlipidemia, Diabetes, past history of Factors:          smoking.  Limitations: Today's exam was limited due to involuntary patient movement and  Toes are curled forward and difficult to wrap toe cuff around. Comparison Study: No prior study at [facility] Performing Technologist: Sherren Kerns RVS  Examination Guidelines: A complete evaluation includes at minimum, Doppler waveform signals and systolic blood pressure reading at the level of bilateral brachial, anterior tibial, and posterior tibial arteries, when vessel segments are accessible. Bilateral testing is considered an integral part of a complete examination. Photoelectric Plethysmograph (PPG) waveforms and toe systolic pressure readings are included as required and additional duplex testing as needed. Limited examinations for reoccurring indications may be performed as noted.  ABI Findings: +---------+------------------+-----+-----------+--------+ Right    Rt Pressure (mmHg)IndexWaveform   Comment  +---------+------------------+-----+-----------+--------+ Brachial 149                    triphasic           +---------+------------------+-----+-----------+--------+ PTA      173               1.15 biphasic            +---------+------------------+-----+-----------+--------+ DP       148               0.98 multiphasic         +---------+------------------+-----+-----------+--------+ Great Toe176               1.17                     +---------+------------------+-----+-----------+--------+ +---------+------------------+-----+-----------+-------+ Left     Lt Pressure (mmHg)IndexWaveform   Comment +---------+------------------+-----+-----------+-------+ Brachial 151                    triphasic          +---------+------------------+-----+-----------+-------+ PTA      175               1.16 biphasic            +---------+------------------+-----+-----------+-------+ DP       171               1.13 multiphasic        +---------+------------------+-----+-----------+-------+ Great Toe152               1.01                    +---------+------------------+-----+-----------+-------+ +-------+-----------+-----------+------------+------------+ ABI/TBIToday's ABIToday's TBIPrevious ABIPrevious TBI +-------+-----------+-----------+------------+------------+ Right  1.15       1.17                                +-------+-----------+-----------+------------+------------+ Left   1.16       1.01                                +-------+-----------+-----------+------------+------------+   Summary: Right: Resting right ankle-brachial index is within normal range. The right toe-brachial index is normal. Left: Resting left ankle-brachial index is within normal range. The left toe-brachial index is normal. *See table(s) above for measurements and observations.     Preliminary    MR FOOT LEFT W WO CONTRAST Result Date: 07/30/2023 CLINICAL DATA:  Foot pain, chronic, osseous injury suspected EXAM: MRI OF THE LEFT FOREFOOT WITHOUT AND WITH CONTRAST TECHNIQUE: Multiplanar, multisequence MR imaging of the left forefoot was performed both before and after administration of intravenous contrast. CONTRAST:  10mL GADAVIST GADOBUTROL 1 MMOL/ML IV SOLN COMPARISON:  X-ray 07/29/2023 FINDINGS: Bones/Joint/Cartilage Extensive postsurgical changes of the forefoot with partial fourth and fifth ray resections. Intense bone marrow edema and enhancement throughout the residual fifth metatarsal with confluent low T1 marrow signal changes compatible with acute osteomyelitis. Pathologic fracture of the proximal metaphysis, better seen radiographically. Small fourth and fifth TMT joint effusion which may be reactive or reflect septic arthritis. Bone marrow edema within the proximal phalanx of the third toe and possible nondisplaced  fracture plane seen on series 9, image 10. No additional sites of bone marrow edema. Ligaments Intact Lisfranc ligament.  No collateral ligament injury. Muscles and Tendons Diffuse edema-like signal of the foot musculature which may represent changes related to denervation and/or myositis. No tenosynovitis. Soft tissues Soft tissue ulceration at the lateral aspect of the proximal forefoot overlying the proximal fifth metatarsal. Soft tissue edema and enhancement within the adjacent soft tissues compatible with cellulitis. No organized fluid collections. IMPRESSION: 1. Acute osteomyelitis of the residual fifth metatarsal. 2. Small fourth and fifth TMT joint effusion which may be reactive or reflect septic arthritis. 3. Bone marrow edema within the proximal phalanx of the third toe and possible nondisplaced fracture plane. 4. Soft tissue ulceration at the lateral aspect of the proximal forefoot overlying the proximal fifth metatarsal. Soft tissue edema and enhancement within the adjacent soft tissues compatible with cellulitis. No organized fluid collections. 5. Diffuse edema-like signal of the foot musculature which may represent changes related to denervation and/or myositis. Electronically Signed   By: Duanne Guess D.O.   On: 07/30/2023 09:36   DG Foot Complete Right Result Date: 07/29/2023 Please see detailed radiograph report in office note.  DG Foot Complete Left Result Date: 07/29/2023 CLINICAL DATA:  Osteomyelitis. Left foot pain. Weakness and body aches. EXAM: LEFT FOOT - COMPLETE 3+ VIEW COMPARISON:  07/27/2023 FINDINGS: Postoperative changes with resection of the left fifth ray at the mid metatarsal level and of the mid/distal aspect of the left fourth metatarsal. Diffuse bone demineralization. Severe degenerative changes in the interphalangeal, remaining metatarsal-phalangeal, tarsometatarsal, and intertarsal joints. Degenerative changes also demonstrated in the ankle joint. There is a soft  tissue defect over the base of the fifth metatarsal with underlying cortical erosion, possibly osteomyelitis. There is a linear lucency transversely across the base of the fifth metatarsal which could represent also a nondisplaced fracture. Lucency again demonstrated in the lateral aspect of the first metatarsal head, nonspecific for osteomyelitis versus degenerative change. 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 No radiopaque soft tissue foreign bodies or soft tissue gas. Vascular calcifications. IMPRESSION: 1. Soft tissue defect over the base of the fifth metatarsal with underlying cortical erosion, possibly osteomyelitis, and with linear lucency possibly indicating nondisplaced fracture. 2. Nonspecific lucency at the lateral aspect of the first metatarsal head, unchanged. 3. Prominent diffuse degenerative changes. 4. Prior resection involving the fourth and fifth rays. Electronically Signed   By: Burman Nieves M.D.   On: 07/29/2023 17:20        Scheduled Meds:  aspirin EC  81 mg Oral Daily   atorvastatin  10 mg Oral Daily   fenofibrate  160 mg Oral Daily   gabapentin  800 mg Oral TID   insulin aspart  0-24 Units Subcutaneous TID WC   insulin glargine  40 Units Subcutaneous Daily   living well with diabetes book   Does not apply Once   lumateperone tosylate  42 mg Oral Daily   pantoprazole  40 mg Oral QAC breakfast  ramipril  2.5 mg Oral Daily   venlafaxine XR  150 mg Oral Q breakfast   Continuous Infusions:  ampicillin-sulbactam (UNASYN) IV     vancomycin 1,250 mg (07/30/23 0715)     LOS: 1 day    Time spent: 35 minutes    Ramiro Harvest, MD [facility]   To contact the attending provider between 7A-7P or the covering provider during after hours 7P-7A, please log into the web site [email or web address] and access using universal [place] password for that web site. If you do not have the password, please call the hospital  operator.  07/30/2023, 4:22 PM

## 2023-07-31 ENCOUNTER — Inpatient Hospital Stay (HOSPITAL_COMMUNITY): Payer: Medicaid Other

## 2023-07-31 ENCOUNTER — Other Ambulatory Visit: Payer: Self-pay

## 2023-07-31 ENCOUNTER — Inpatient Hospital Stay (HOSPITAL_COMMUNITY): Payer: Self-pay | Admitting: Anesthesiology

## 2023-07-31 ENCOUNTER — Encounter (HOSPITAL_COMMUNITY)
Admission: EM | Disposition: A | Discharge status: Home / Self Care | Payer: Self-pay | Source: Home / Self Care | Attending: Internal Medicine

## 2023-07-31 ENCOUNTER — Encounter (HOSPITAL_COMMUNITY): Payer: Self-pay | Admitting: Internal Medicine

## 2023-07-31 DIAGNOSIS — G894 Chronic pain syndrome: Secondary | ICD-10-CM | POA: Diagnosis not present

## 2023-07-31 DIAGNOSIS — I10 Essential (primary) hypertension: Secondary | ICD-10-CM

## 2023-07-31 DIAGNOSIS — E876 Hypokalemia: Secondary | ICD-10-CM | POA: Diagnosis not present

## 2023-07-31 DIAGNOSIS — M869 Osteomyelitis, unspecified: Secondary | ICD-10-CM

## 2023-07-31 DIAGNOSIS — Z87891 Personal history of nicotine dependence: Secondary | ICD-10-CM

## 2023-07-31 DIAGNOSIS — M86472 Chronic osteomyelitis with draining sinus, left ankle and foot: Secondary | ICD-10-CM | POA: Diagnosis not present

## 2023-07-31 DIAGNOSIS — E1169 Type 2 diabetes mellitus with other specified complication: Secondary | ICD-10-CM | POA: Diagnosis not present

## 2023-07-31 DIAGNOSIS — M868X7 Other osteomyelitis, ankle and foot: Secondary | ICD-10-CM | POA: Diagnosis not present

## 2023-07-31 DIAGNOSIS — G629 Polyneuropathy, unspecified: Secondary | ICD-10-CM | POA: Diagnosis not present

## 2023-07-31 HISTORY — PX: IRRIGATION AND DEBRIDEMENT FOOT: SHX6602

## 2023-07-31 LAB — MAGNESIUM: Magnesium: 2.2 mg/dL (ref 1.7–2.4)

## 2023-07-31 LAB — GLUCOSE, CAPILLARY
Glucose-Capillary: 163 mg/dL — ABNORMAL HIGH (ref 70–99)
Glucose-Capillary: 175 mg/dL — ABNORMAL HIGH (ref 70–99)
Glucose-Capillary: 130 mg/dL — ABNORMAL HIGH (ref 70–99)
Glucose-Capillary: 135 mg/dL — ABNORMAL HIGH (ref 70–99)
Glucose-Capillary: 265 mg/dL — ABNORMAL HIGH (ref 70–99)
Glucose-Capillary: 95 mg/dL (ref 70–99)

## 2023-07-31 LAB — CBC
HCT: 36 % — ABNORMAL LOW (ref 39.0–52.0)
Hemoglobin: 12.3 g/dL — ABNORMAL LOW (ref 13.0–17.0)
MCH: 28.8 pg (ref 26.0–34.0)
MCHC: 34.2 g/dL (ref 30.0–36.0)
MCV: 84.3 fL (ref 80.0–100.0)
Platelets: 276 10*3/uL (ref 150–400)
RBC: 4.27 MIL/uL (ref 4.22–5.81)
RDW: 13.5 % (ref 11.5–15.5)
WBC: 8.4 10*3/uL (ref 4.0–10.5)
nRBC: 0 % (ref 0.0–0.2)

## 2023-07-31 LAB — BASIC METABOLIC PANEL
Anion gap: 13 (ref 5–15)
BUN: 9 mg/dL (ref 6–20)
CO2: 24 mmol/L (ref 22–32)
Calcium: 9.3 mg/dL (ref 8.9–10.3)
Chloride: 103 mmol/L (ref 98–111)
Creatinine, Ser: 1.09 mg/dL (ref 0.61–1.24)
GFR, Estimated: >60 mL/min (ref >60)
Glucose, Bld: 157 mg/dL — ABNORMAL HIGH (ref 70–99)
Potassium: 3.7 mmol/L (ref 3.5–5.1)
Sodium: 140 mmol/L (ref 135–145)

## 2023-07-31 LAB — VAS US ABI WITH/WO TBI
Left ABI: 1.16
Right ABI: 1.15

## 2023-07-31 LAB — SURGICAL PCR SCREEN
MRSA, PCR: NEGATIVE
Staphylococcus aureus: NEGATIVE

## 2023-07-31 SURGERY — IRRIGATION AND DEBRIDEMENT FOOT
Anesthesia: General | Site: Foot | Laterality: Left

## 2023-07-31 MED ORDER — ACETAMINOPHEN 500 MG PO TABS: 1000.0000 mg | ORAL_TABLET | Freq: Once | ORAL | Status: AC

## 2023-07-31 MED ORDER — DEXTROAMPHETAMINE SULFATE 5 MG PO TABS: 10.0000 mg | ORAL_TABLET | Freq: Every day | ORAL | Status: DC

## 2023-07-31 MED ORDER — FENTANYL CITRATE (PF) 100 MCG/2ML IJ SOLN: 25.0000 ug | INTRAMUSCULAR | Status: DC | PRN

## 2023-07-31 MED ORDER — 0.9 % SODIUM CHLORIDE (POUR BTL) OPTIME
TOPICAL | Status: DC | PRN
Start: 2023-07-31 — End: 2023-07-31
  Administered 2023-07-31: 3000 mL

## 2023-07-31 MED ORDER — LACTATED RINGERS IV SOLN
INTRAVENOUS | Status: DC | PRN
Start: 2023-07-31 — End: 2023-07-31

## 2023-07-31 MED ORDER — MIDAZOLAM HCL 2 MG/2ML IJ SOLN
INTRAMUSCULAR | Status: DC | PRN
  Administered 2023-07-31: 2 mg via INTRAVENOUS

## 2023-07-31 MED ORDER — PROPOFOL 10 MG/ML IV BOLUS
INTRAVENOUS | Status: DC | PRN
  Administered 2023-07-31: 40 mg via INTRAVENOUS

## 2023-07-31 MED ORDER — VANCOMYCIN HCL 1000 MG IV SOLR
INTRAVENOUS | Status: AC
  Filled 2023-07-31: qty 20

## 2023-07-31 MED ORDER — MIDAZOLAM HCL 2 MG/2ML IJ SOLN
INTRAMUSCULAR | Status: AC
  Filled 2023-07-31: qty 2

## 2023-07-31 MED ORDER — TOBRAMYCIN SULFATE 80 MG/2ML IJ SOLN
INTRAMUSCULAR | Status: AC
  Filled 2023-07-31: qty 2

## 2023-07-31 MED ORDER — BUPIVACAINE HCL (PF) 0.5 % IJ SOLN
INTRAMUSCULAR | Status: AC
  Filled 2023-07-31: qty 30

## 2023-07-31 MED ORDER — LUMATEPERONE TOSYLATE 42 MG PO CAPS
42.0000 mg | ORAL_CAPSULE | Freq: Every day | ORAL | Status: DC
  Administered 2023-08-01 – 2023-08-07 (×7): 42 mg via ORAL
  Filled 2023-07-31 (×7): qty 1

## 2023-07-31 MED ORDER — BUPIVACAINE HCL (PF) 0.5 % IJ SOLN
INTRAMUSCULAR | Status: DC | PRN
  Administered 2023-07-31: 10 mL

## 2023-07-31 MED ORDER — OXYCODONE HCL 5 MG PO TABS: 5.0000 mg | ORAL_TABLET | Freq: Once | ORAL | Status: DC | PRN

## 2023-07-31 MED ORDER — DROPERIDOL 2.5 MG/ML IJ SOLN: 0.6250 mg | Freq: Once | INTRAMUSCULAR | Status: DC | PRN

## 2023-07-31 MED ORDER — ORAL CARE MOUTH RINSE: 15.0000 mL | Freq: Once | OROMUCOSAL | Status: AC

## 2023-07-31 MED ORDER — DEXTROAMPHETAMINE SULFATE 5 MG PO TABS: 20.0000 mg | ORAL_TABLET | Freq: Every morning | ORAL | Status: DC

## 2023-07-31 MED ORDER — PROPOFOL 10 MG/ML IV BOLUS
INTRAVENOUS | Status: AC
  Filled 2023-07-31: qty 20

## 2023-07-31 MED ORDER — VENLAFAXINE HCL ER 150 MG PO CP24
150.0000 mg | ORAL_CAPSULE | Freq: Every day | ORAL | Status: DC
  Administered 2023-08-01 – 2023-08-07 (×7): 150 mg via ORAL
  Filled 2023-07-31 (×7): qty 1

## 2023-07-31 MED ORDER — PROPOFOL 1000 MG/100ML IV EMUL
INTRAVENOUS | Status: AC
  Filled 2023-07-31: qty 100

## 2023-07-31 MED ORDER — OXYCODONE HCL 5 MG/5ML PO SOLN: 5.0000 mg | Freq: Once | ORAL | Status: DC | PRN

## 2023-07-31 MED ORDER — VANCOMYCIN HCL 500 MG IV SOLR
INTRAVENOUS | Status: DC | PRN
  Administered 2023-07-31: 500 mg

## 2023-07-31 MED ORDER — LACTATED RINGERS IV SOLN: INTRAVENOUS | Status: DC

## 2023-07-31 MED ORDER — FENTANYL CITRATE (PF) 250 MCG/5ML IJ SOLN
INTRAMUSCULAR | Status: DC | PRN
  Administered 2023-07-31 (×2): 50 ug via INTRAVENOUS

## 2023-07-31 MED ORDER — ONDANSETRON HCL 4 MG/2ML IJ SOLN
INTRAMUSCULAR | Status: DC | PRN
  Administered 2023-07-31: 4 mg via INTRAVENOUS

## 2023-07-31 MED ORDER — TOBRAMYCIN SULFATE 80 MG/2ML IJ SOLN
INTRAMUSCULAR | Status: DC | PRN
  Administered 2023-07-31: 120 mg

## 2023-07-31 MED ORDER — EPHEDRINE SULFATE (PRESSORS) 50 MG/ML IJ SOLN
INTRAMUSCULAR | Status: DC | PRN
  Administered 2023-07-31: 7.5 mg via INTRAVENOUS
  Administered 2023-07-31: 5 mg via INTRAVENOUS

## 2023-07-31 MED ORDER — ACETAMINOPHEN 500 MG PO TABS
ORAL_TABLET | ORAL | Status: AC
  Administered 2023-07-31: 1000 mg via ORAL
  Filled 2023-07-31: qty 2

## 2023-07-31 MED ORDER — INSULIN GLARGINE 100 UNIT/ML ~~LOC~~ SOLN
50.0000 [IU] | Freq: Every day | SUBCUTANEOUS | Status: DC
  Filled 2023-07-31: qty 0.5

## 2023-07-31 MED ORDER — LIDOCAINE 2% (20 MG/ML) 5 ML SYRINGE
INTRAMUSCULAR | Status: DC | PRN
  Administered 2023-07-31: 80 mg via INTRAVENOUS

## 2023-07-31 MED ORDER — PROPOFOL 500 MG/50ML IV EMUL
INTRAVENOUS | Status: DC | PRN
  Administered 2023-07-31: 80 ug/kg/min via INTRAVENOUS

## 2023-07-31 MED ORDER — FENTANYL CITRATE (PF) 250 MCG/5ML IJ SOLN
INTRAMUSCULAR | Status: AC
  Filled 2023-07-31: qty 5

## 2023-07-31 MED ORDER — CHLORHEXIDINE GLUCONATE 0.12 % MT SOLN
OROMUCOSAL | Status: AC
Start: 2023-07-31 — End: 2023-07-31
  Administered 2023-07-31: 15 mL via OROMUCOSAL
  Filled 2023-07-31: qty 15

## 2023-07-31 MED ORDER — CHLORHEXIDINE GLUCONATE 0.12 % MT SOLN: 15.0000 mL | Freq: Once | OROMUCOSAL | Status: AC

## 2023-07-31 MED ORDER — INSULIN GLARGINE 100 UNIT/ML ~~LOC~~ SOLN
10.0000 [IU] | Freq: Once | SUBCUTANEOUS | Status: AC
  Administered 2023-07-31: 10 [IU] via SUBCUTANEOUS
  Filled 2023-07-31: qty 0.1

## 2023-07-31 SURGICAL SUPPLY — 48 items
BEADS BIO ARTH CALC SULFAT 5CC (Bone Implant) IMPLANT
BIOBEADS ARTH CALC SULFATE 5CC (Bone Implant) ×1 IMPLANT
BLADE SURG 15 STRL LF DISP TIS (BLADE) ×2 IMPLANT
BNDG ELASTIC 3INX 5YD STR LF (GAUZE/BANDAGES/DRESSINGS) ×2 IMPLANT
BNDG ELASTIC 4INX 5YD STR LF (GAUZE/BANDAGES/DRESSINGS) ×2 IMPLANT
BNDG ESMARK 4X9 LF (GAUZE/BANDAGES/DRESSINGS) ×2 IMPLANT
BNDG GAUZE DERMACEA FLUFF 4 (GAUZE/BANDAGES/DRESSINGS) ×2 IMPLANT
CHLORAPREP W/TINT 26 (MISCELLANEOUS) ×2 IMPLANT
CNTNR URN SCR LID CUP LEK RST (MISCELLANEOUS) ×2 IMPLANT
COVER BACK TABLE 60X90IN (DRAPES) ×2 IMPLANT
DRAPE 3/4 80X56 (DRAPES) ×2 IMPLANT
DRAPE EXTREMITY T 121X128X90 (DISPOSABLE) ×2 IMPLANT
DRAPE SHEET LG 3/4 BI-LAMINATE (DRAPES) ×2 IMPLANT
DRAPE U-SHAPE 47X51 STRL (DRAPES) ×2 IMPLANT
DRSG ADAPTIC 3X8 NADH LF (GAUZE/BANDAGES/DRESSINGS) IMPLANT
ELECT REM PT RETURN 15FT ADLT (MISCELLANEOUS) ×2 IMPLANT
GAUZE PAD ABD 8X10 STRL (GAUZE/BANDAGES/DRESSINGS) IMPLANT
GAUZE SPONGE 4X4 12PLY STRL (GAUZE/BANDAGES/DRESSINGS) ×2 IMPLANT
GAUZE XEROFORM 1X8 LF (GAUZE/BANDAGES/DRESSINGS) ×2 IMPLANT
GLOVE BIO SURGEON STRL SZ7.5 (GLOVE) ×2 IMPLANT
GLOVE BIOGEL PI IND STRL 7.5 (GLOVE) ×2 IMPLANT
GOWN STRL REUS W/ TWL XL LVL3 (GOWN DISPOSABLE) ×2 IMPLANT
GRAFT SKIN WND MICRO 38 (Tissue) IMPLANT
KIT BASIN OR (CUSTOM PROCEDURE TRAY) ×2 IMPLANT
KIT TURNOVER KIT B (KITS) IMPLANT
MANIFOLD NEPTUNE II (INSTRUMENTS) ×2 IMPLANT
NDL HYPO 25X1 1.5 SAFETY (NEEDLE) ×2 IMPLANT
NEEDLE HYPO 25X1 1.5 SAFETY (NEEDLE) ×1 IMPLANT
NS IRRIG 1000ML POUR BTL (IV SOLUTION) ×2 IMPLANT
PADDING CAST ABS COTTON 4X4 ST (CAST SUPPLIES) ×2 IMPLANT
SET IRRIG Y TYPE TUR BLADDER L (SET/KITS/TRAYS/PACK) ×2 IMPLANT
SPONGE T-LAP 4X18 ~~LOC~~+RFID (SPONGE) ×2 IMPLANT
STAPLER SKIN PROX WIDE 3.9 (STAPLE) ×2 IMPLANT
STOCKINETTE 6 STRL (DRAPES) ×2 IMPLANT
SUCTION TUBE FRAZIER 10FR DISP (SUCTIONS) ×2 IMPLANT
SUT ETHILON 3 0 PS 1 (SUTURE) ×2 IMPLANT
SUT ETHILON 4 0 PS 2 18 (SUTURE) ×2 IMPLANT
SUT MNCRL AB 3-0 PS2 18 (SUTURE) ×2 IMPLANT
SUT MNCRL AB 4-0 PS2 18 (SUTURE) ×2 IMPLANT
SUT PROLENE 2 0 FS (SUTURE) IMPLANT
SUT PROLENE 3 0 PS 2 (SUTURE) IMPLANT
SUT VIC AB 2-0 SH 27XBRD (SUTURE) ×2 IMPLANT
SYR BULB EAR ULCER 3OZ GRN STR (SYRINGE) ×2 IMPLANT
SYR CONTROL 10ML LL (SYRINGE) ×2 IMPLANT
TUBE IRRIGATION SET MISONIX (TUBING) ×2 IMPLANT
UNDERPAD 30X36 HEAVY ABSORB (UNDERPADS AND DIAPERS) ×2 IMPLANT
YANKAUER SUCT BULB TIP 10FT TU (MISCELLANEOUS) ×2 IMPLANT
YANKAUER SUCT BULB TIP NO VENT (SUCTIONS) ×2 IMPLANT

## 2023-07-31 NOTE — Progress Notes (Signed)
 History and Physical Interval Note:  07/31/2023 8:50 AM  [name]  has presented today for surgery, with the diagnosis of osteomyelitis of left foot 5th metatarsal.  The various methods of treatment have been discussed with the patient and family. After consideration of risks, benefits and other options for treatment, the patient has consented to   Procedure(s): IRRIGATION AND DEBRIDEMENT FOOT AND FIFTH MET RESCTION (Left) as a surgical intervention.  The patient's history has been reviewed, patient examined, no change in status, stable for surgery.  I have reviewed the patient's chart and labs.  Questions were answered to the patient's satisfaction.     Jenelle Mages Carrell Rahmani

## 2023-07-31 NOTE — Anesthesia Preprocedure Evaluation (Addendum)
 Anesthesia Evaluation  Patient identified by MRN, date of birth, ID band Patient awake    Reviewed: Allergy & Precautions, NPO status , Patient's Chart, lab work & pertinent test results  History of Anesthesia Complications Negative for: history of anesthetic complications  Airway Mallampati: II  TM Distance: >3 FB Neck ROM: Full    Dental  (+) Missing,    Pulmonary former smoker   Pulmonary exam normal        Cardiovascular hypertension, Normal cardiovascular exam     Neuro/Psych   Anxiety Depression    negative neurological ROS     GI/Hepatic Neg liver ROS, PUD,,,  Endo/Other  diabetes, Type 2, Insulin Dependent    Renal/GU negative Renal ROS     Musculoskeletal  (+) Arthritis ,  osteomyelitis left foot   Abdominal   Peds  Hematology negative hematology ROS (+)   Anesthesia Other Findings Day of surgery medications reviewed with patient.  Reproductive/Obstetrics                              Anesthesia Physical Anesthesia Plan  ASA: 3  Anesthesia Plan: General   Post-op Pain Management: Tylenol PO (pre-op)*   Induction:   PONV Risk Score and Plan: 2 and Ondansetron, Treatment may vary due to age or medical condition, Midazolam and Dexamethasone  Airway Management Planned: LMA  Additional Equipment: None  Intra-op Plan:   Post-operative Plan: Extubation in OR  Informed Consent: I have reviewed the patients History and Physical, chart, labs and discussed the procedure including the risks, benefits and alternatives for the proposed anesthesia with the patient or authorized representative who has indicated his/her understanding and acceptance.     Dental advisory given  Plan Discussed with: CRNA  Anesthesia Plan Comments:          Anesthesia Quick Evaluation

## 2023-07-31 NOTE — Anesthesia Postprocedure Evaluation (Signed)
 Anesthesia Post Note  Patient: [name]  Procedure(s) Performed: IRRIGATION AND DEBRIDEMENT FOOT AND FIFTH MET RESCTION (Left: Foot)     Patient location during evaluation: PACU Anesthesia Type: General Level of consciousness: awake and alert Pain management: pain level controlled Vital Signs Assessment: post-procedure vital signs reviewed and stable Respiratory status: spontaneous breathing, nonlabored ventilation and respiratory function stable Cardiovascular status: blood pressure returned to baseline Postop Assessment: no apparent nausea or vomiting Anesthetic complications: no   No notable events documented.  Last Vitals:  Vitals:   07/31/23 1145 07/31/23 1200  BP: 107/71 115/68  Pulse: (!) 59 68  Resp: 16 19  Temp:  36.6 C  SpO2: 94% 97%    Last Pain:  Vitals:   07/31/23 1200  TempSrc:   PainSc: 0-No pain                 Shanda Howells

## 2023-07-31 NOTE — Transfer of Care (Signed)
 Immediate Anesthesia Transfer of Care Note  Patient: [name]  Procedure(s) Performed: IRRIGATION AND DEBRIDEMENT FOOT AND FIFTH MET RESCTION (Left: Foot)  Patient Location: PACU  Anesthesia Type:MAC  Level of Consciousness: awake and drowsy  Airway & Oxygen Therapy: Patient Spontanous Breathing  Post-op Assessment: Report given to RN, Post -op Vital signs reviewed and stable, and Patient moving all extremities  Post vital signs: Reviewed and stable  Last Vitals:  Vitals Value Taken Time  BP 101/65 07/31/2023  Temp    Pulse 62 07/31/23 1138  Resp 20 07/31/23 1138  SpO2 94 % 07/31/23 1138  Vitals shown include unfiled device data.  Last Pain:  Vitals:   07/31/23 1018  TempSrc: Oral  PainSc: 0-No pain         Complications: No notable events documented.

## 2023-07-31 NOTE — Progress Notes (Signed)
 PROGRESS NOTE    [name]  YQM:[id number] DOB: 1965/10/04 DOA: 07/29/2023 PCP: [name], MD    Chief Complaint  Patient presents with   Foot Pain    Brief Narrative:  Patient is a 58 year old gentleman history of ADHD, depression, type 2 diabetes, hypertension presented to the ED due to concerns for osteomyelitis.  Patient with left foot pain has been followed at the wound care center for several months but no significant healing.  Patient developed worsening ulceration and drainage [facility].  Patient also noted to be hypokalemic.  Patient admitted for further evaluation for osteomyelitis.  Podiatry consulted.  Patient to the OR 07/31/2023 per podiatry.   Assessment & Plan:   Principal Problem:   Osteomyelitis (HCC) Active Problems:   Chronic pain syndrome   Diabetic peripheral neuropathy associated with type 2 diabetes mellitus (HCC)   Essential hypertension   Neuropathy   Gastroesophageal reflux disease with esophagitis without hemorrhage   Hypokalemia   1 osteomyelitis of left residual fifth metatarsal base with draining ulceration -Patient noted to have presented to the ED with worsening left lower extremity wound with worsening ulceration and drainage with notable ulceration of the lateral left foot. -Patient with poorly controlled type 2 diabetes. -Plain films of the left foot with concern for osteomyelitis. -MRI of the left foot with acute osteomyelitis of the residual fifth metatarsal, small fourth and fifth TMT joint effusion which may be reactive or reflect septic arthritis. -Superficial cultures obtained in ED with and an Enterobacter species with sensitivities pending. -Bilateral ABI normal. -Continue IV vancomycin and IV Unasyn. -Patient seen in consultation by podiatry and patient for left fifth metatarsal base resection today, 07/31/2023, per podiatry. -Continue current wound care recommendations per  podiatry. -WBAT left foot. -Postop and once cultures have resulted will need ID input for antibiotic duration and recommendations. -Per orthopedics.  2.  Poorly controlled type 2 diabetes mellitus -Hemoglobin A1c 10.9 (07/30/2023). -CBG 163 this morning. -Patient states has not had his insulin in several months due to insurance issues and being unable to follow-up with his endocrinologist. -Patient currently n.p.o. in anticipation of procedure today. -Continue Lantus to 40 units daily, continue SSI, continue Neurontin.  3.  Hyperlipidemia -Continue fenofibrate, Lipitor.  4.  CAD -Continue Lipitor, fenofibrate, ramipril, aspirin.  5.  Chronic pain -Continue gabapentin.   6.  GERD -PPI.  7.  Hypertension -Ramipril  8.  Hypokalemia -Repleted, potassium at 3.7 today, magnesium at 2.2. -Repeat labs in the AM.  DVT prophylaxis: None in anticipation of surgery today, postop DVT prophylaxis per podiatry. Code Status: Full Family Communication: Updated patient.  No family at bedside. Disposition: TBD  Status is: Inpatient Remains inpatient appropriate because: Severity of illness   Consultants:  Podiatry: Dr. Annamary Rummage 07/30/2023 Wound care RN 07/30/2023  Procedures:  Plain films of the left foot 07/29/2023 MRI left foot 07/30/2023 ABI with TBI 07/30/2023  Antimicrobials:  Anti-infectives (From admission, onward)    Start     Dose/Rate Route Frequency Ordered Stop   07/30/23 1800  [MAR Hold]  Ampicillin-Sulbactam (UNASYN) 3 g in sodium chloride 0.9 % 100 mL IVPB        (MAR Hold since Fri 07/31/2023 at 0853.Hold Reason: Transfer to a Procedural area)   3 g 200 mL/hr over 30 Minutes Intravenous Every 6 hours 07/30/23 0829     07/30/23 0600  [MAR Hold]  vancomycin (VANCOREADY) IVPB 1250 mg/250 mL        (  MAR Hold since Fri 07/31/2023 at 0853.Hold Reason: Transfer to a Procedural area)   1,250 mg 166.7 mL/hr over 90 Minutes Intravenous Every 12 hours 07/29/23 2352     07/30/23  0100  piperacillin-tazobactam (ZOSYN) IVPB 3.375 g  Status:  Discontinued        3.375 g 12.5 mL/hr over 240 Minutes Intravenous Every 8 hours 07/29/23 2352 07/30/23 0829   07/29/23 1745  vancomycin (VANCOCIN) IVPB 1000 mg/200 mL premix       Placed in "Followed by" Linked Group   1,000 mg 200 mL/hr over 60 Minutes Intravenous  Once 07/29/23 1630 07/29/23 1925   07/29/23 1630  vancomycin (VANCOCIN) IVPB 1000 mg/200 mL premix  Status:  Discontinued        1,000 mg 200 mL/hr over 60 Minutes Intravenous  Once 07/29/23 1623 07/29/23 1626   07/29/23 1630  piperacillin-tazobactam (ZOSYN) IVPB 3.375 g        3.375 g 100 mL/hr over 30 Minutes Intravenous  Once 07/29/23 1623 07/29/23 1731   07/29/23 1630  vancomycin (VANCOCIN) IVPB 1000 mg/200 mL premix       Placed in "Followed by" Linked Group   1,000 mg 200 mL/hr over 60 Minutes Intravenous  Once 07/29/23 1630 07/29/23 1843         Subjective: Patient laying in bed.  States getting ready to go for surgery.  Denies any chest pain or shortness of breath.  No abdominal pain.    Objective: Vitals:   07/30/23 1355 07/30/23 2014 07/31/23 0435 07/31/23 0745  BP: (!) 141/86 138/75 129/74 125/78  Pulse: 87 88 65 63  Resp: 18 20 19 18   Temp: 98.5 F (36.9 C) 98.2 F (36.8 C) 98.5 F (36.9 C) 97.9 F (36.6 C)  TempSrc:    Oral  SpO2: 98% 98% 95% 99%  Weight:      Height:        Intake/Output Summary (Last 24 hours) at 07/31/2023 0931 Last data filed at 07/31/2023 1610 Gross per 24 hour  Intake 1708.02 ml  Output 400 ml  Net 1308.02 ml   Filed Weights   07/29/23 2044  Weight: 94.8 kg    Examination:  General exam: NAD Respiratory system: Lungs clear to auscultation bilaterally.  No wheezes, no crackles, no rhonchi.  Fair air movement.  Speaking in full sentences.   Cardiovascular system: RRR no murmurs rubs or gallops.  No JVD.  No pitting lower extremity edema.  Gastrointestinal system: Abdomen is soft, nontender,  nondistended, positive bowel sounds.  No rebound.  No guarding.  Central nervous system: Alert and oriented. No focal neurological deficits. Extremities: Left foot with decreased erythema, no significant tenderness to palpation, status post fourth digit amputation.  Ulceration noted on lateral plantar surface of the left foot around the midfoot concerning for draining sinus tract.  Surrounding erythema.. Skin: No rashes, lesions or ulcers Psychiatry: Judgement and insight appear normal. Mood & affect appropriate.     Data Reviewed: I have personally reviewed following labs and imaging studies  CBC: Recent Labs  Lab 07/29/23 1518 07/30/23 0837 07/31/23 0543  WBC 10.2 7.4 8.4  NEUTROABS 7.0 4.7  --   HGB 12.7* 11.1* 12.3*  HCT 36.6* 31.1* 36.0*  MCV 83.0 82.3 84.3  PLT 303 218 276    Basic Metabolic Panel: Recent Labs  Lab 07/29/23 1518 07/30/23 0837 07/31/23 0543  NA 137 135 140  K 2.7* 3.9 3.7  CL 99 100 103  CO2 28 24 24   GLUCOSE  53* 234* 157*  BUN 13 7 9   CREATININE 1.07 1.02 1.09  CALCIUM 10.1 8.9 9.3  MG 1.9 1.7 2.2    GFR: Estimated Creatinine Clearance: 89.6 mL/min (by C-G formula based on SCr of 1.09 mg/dL).  Liver Function Tests: Recent Labs  Lab 07/29/23 1518  AST 26  ALT 25  ALKPHOS 65  BILITOT 0.6  PROT 7.7  ALBUMIN 4.3    CBG: Recent Labs  Lab 07/30/23 1139 07/30/23 1630 07/30/23 2010 07/31/23 0652 07/31/23 0846  GLUCAP 298* 99 200* 163* 175*     Recent Results (from the past 240 hours)  Blood Cultures x 2 sites     Status: None (Preliminary result)   Collection Time: 07/29/23  2:52 PM   Specimen: BLOOD LEFT ARM  Result Value Ref Range Status   Specimen Description BLOOD LEFT ARM  Final   Special Requests   Final    BOTTLES DRAWN AEROBIC AND ANAEROBIC Blood Culture adequate volume   Culture   Final    NO GROWTH 2 DAYS Performed at [facility], [address], [place], [place] [address]    Report Status PENDING   Incomplete  Blood Cultures x 2 sites     Status: None (Preliminary result)   Collection Time: 07/29/23  2:57 PM   Specimen: BLOOD RIGHT ARM  Result Value Ref Range Status   Specimen Description BLOOD RIGHT ARM  Final   Special Requests   Final    BOTTLES DRAWN AEROBIC AND ANAEROBIC Blood Culture adequate volume   Culture   Final    NO GROWTH 2 DAYS Performed at [facility], [address], [place], [place] [address]    Report Status PENDING  Incomplete  Aerobic Culture w Gram Stain (superficial specimen)     Status: None (Preliminary result)   Collection Time: 07/29/23  4:00 PM   Specimen: Wound  Result Value Ref Range Status   Specimen Description   Final    WOUND Performed at [facility], [address], [place], [place] [address]    Special Requests   Final    NONE Performed at [facility], [address] [place], [place] [address]    Gram Stain NO WBC SEEN RARE GRAM POSITIVE RODS   Final   Culture   Final    ABUNDANT ENTEROBACTER CLOACAE MODERATE STAPHYLOCOCCUS AUREUS SUSCEPTIBILITIES TO FOLLOW Performed at [facility], [address] [place], [place] [address]    Report Status PENDING  Incomplete  Surgical pcr screen     Status: None   Collection Time: 07/31/23  8:00 AM   Specimen: Nasal Mucosa; Nasal Swab  Result Value Ref Range Status   MRSA, PCR NEGATIVE NEGATIVE Final   Staphylococcus aureus NEGATIVE NEGATIVE Final    Comment: (NOTE) The Xpert SA Assay (FDA approved for NASAL specimens in patients 25 years of age and older), is one component of a comprehensive surveillance program. It is not intended to diagnose infection nor to guide or monitor treatment. Performed at [facility], [address], [place], [place] [address]          Radiology Studies: VAS [place] ABI WITH/WO TBI Result Date: 07/31/2023  LOWER EXTREMITY DOPPLER STUDY Patient Name:  ZYRELL CARMEAN  Date of Exam:   07/30/2023  Medical Rec #: [id number]          Accession #:    [id number] Date of Birth: 25-Mar-1966  Patient Gender: M Patient Age:   43 years Exam Location:  [facility] Procedure:      VAS [place] ABI WITH/WO TBI Referring Phys: Alan Mulder --------------------------------------------------------------------------------  Indications: Ulceration. Osteomyelitis in the residual 5th metatarsal High Risk         Hypertension, hyperlipidemia, Diabetes, past history of Factors:          smoking.  Limitations: Today's exam was limited due to involuntary patient movement and              Toes are curled forward and difficult to wrap toe cuff around. Comparison Study: No prior study at [facility] Performing Technologist: Sherren Kerns RVS  Examination Guidelines: A complete evaluation includes at minimum, Doppler waveform signals and systolic blood pressure reading at the level of bilateral brachial, anterior tibial, and posterior tibial arteries, when vessel segments are accessible. Bilateral testing is considered an integral part of a complete examination. Photoelectric Plethysmograph (PPG) waveforms and toe systolic pressure readings are included as required and additional duplex testing as needed. Limited examinations for reoccurring indications may be performed as noted.  ABI Findings: +---------+------------------+-----+-----------+--------+ Right    Rt Pressure (mmHg)IndexWaveform   Comment  +---------+------------------+-----+-----------+--------+ Brachial 149                    triphasic           +---------+------------------+-----+-----------+--------+ PTA      173               1.15 biphasic            +---------+------------------+-----+-----------+--------+ DP       148               0.98 multiphasic         +---------+------------------+-----+-----------+--------+ Great Toe176               1.17                     +---------+------------------+-----+-----------+--------+  +---------+------------------+-----+-----------+-------+ Left     Lt Pressure (mmHg)IndexWaveform   Comment +---------+------------------+-----+-----------+-------+ Brachial 151                    triphasic          +---------+------------------+-----+-----------+-------+ PTA      175               1.16 biphasic           +---------+------------------+-----+-----------+-------+ DP       171               1.13 multiphasic        +---------+------------------+-----+-----------+-------+ Great Toe152               1.01                    +---------+------------------+-----+-----------+-------+ +-------+-----------+-----------+------------+------------+ ABI/TBIToday's ABIToday's TBIPrevious ABIPrevious TBI +-------+-----------+-----------+------------+------------+ Right  1.15       1.17                                +-------+-----------+-----------+------------+------------+ Left   1.16       1.01                                +-------+-----------+-----------+------------+------------+   Summary: Right: Resting right ankle-brachial index is within normal range. The right  toe-brachial index is normal. Left: Resting left ankle-brachial index is within normal range. The left toe-brachial index is normal. *See table(s) above for measurements and observations.  Electronically signed by Carolynn Sayers on 07/31/2023 at 7:15:20 AM.    Final    MR FOOT LEFT W WO CONTRAST Result Date: 07/30/2023 CLINICAL DATA:  Foot pain, chronic, osseous injury suspected EXAM: MRI OF THE LEFT FOREFOOT WITHOUT AND WITH CONTRAST TECHNIQUE: Multiplanar, multisequence MR imaging of the left forefoot was performed both before and after administration of intravenous contrast. CONTRAST:  10mL GADAVIST GADOBUTROL 1 MMOL/ML IV SOLN COMPARISON:  X-ray 07/29/2023 FINDINGS: Bones/Joint/Cartilage Extensive postsurgical changes of the forefoot with partial fourth and fifth ray resections. Intense bone marrow  edema and enhancement throughout the residual fifth metatarsal with confluent low T1 marrow signal changes compatible with acute osteomyelitis. Pathologic fracture of the proximal metaphysis, better seen radiographically. Small fourth and fifth TMT joint effusion which may be reactive or reflect septic arthritis. Bone marrow edema within the proximal phalanx of the third toe and possible nondisplaced fracture plane seen on series 9, image 10. No additional sites of bone marrow edema. Ligaments Intact Lisfranc ligament.  No collateral ligament injury. Muscles and Tendons Diffuse edema-like signal of the foot musculature which may represent changes related to denervation and/or myositis. No tenosynovitis. Soft tissues Soft tissue ulceration at the lateral aspect of the proximal forefoot overlying the proximal fifth metatarsal. Soft tissue edema and enhancement within the adjacent soft tissues compatible with cellulitis. No organized fluid collections. IMPRESSION: 1. Acute osteomyelitis of the residual fifth metatarsal. 2. Small fourth and fifth TMT joint effusion which may be reactive or reflect septic arthritis. 3. Bone marrow edema within the proximal phalanx of the third toe and possible nondisplaced fracture plane. 4. Soft tissue ulceration at the lateral aspect of the proximal forefoot overlying the proximal fifth metatarsal. Soft tissue edema and enhancement within the adjacent soft tissues compatible with cellulitis. No organized fluid collections. 5. Diffuse edema-like signal of the foot musculature which may represent changes related to denervation and/or myositis. Electronically Signed   By: Duanne Guess D.O.   On: 07/30/2023 09:36   DG Foot Complete Right Result Date: 07/29/2023 Please see detailed radiograph report in office note.  DG Foot Complete Left Result Date: 07/29/2023 CLINICAL DATA:  Osteomyelitis. Left foot pain. Weakness and body aches. EXAM: LEFT FOOT - COMPLETE 3+ VIEW COMPARISON:   07/27/2023 FINDINGS: Postoperative changes with resection of the left fifth ray at the mid metatarsal level and of the mid/distal aspect of the left fourth metatarsal. Diffuse bone demineralization. Severe degenerative changes in the interphalangeal, remaining metatarsal-phalangeal, tarsometatarsal, and intertarsal joints. Degenerative changes also demonstrated in the ankle joint. There is a soft tissue defect over the base of the fifth metatarsal with underlying cortical erosion, possibly osteomyelitis. There is a linear lucency transversely across the base of the fifth metatarsal which could represent also a nondisplaced fracture. Lucency again demonstrated in the lateral aspect of the first metatarsal head, nonspecific for osteomyelitis versus degenerative change. 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 No radiopaque soft tissue foreign bodies or soft tissue gas. Vascular calcifications. IMPRESSION: 1. Soft tissue defect over the base of the fifth metatarsal with underlying cortical erosion, possibly osteomyelitis, and with linear lucency possibly indicating nondisplaced fracture. 2. Nonspecific lucency at the lateral aspect of the first metatarsal head, unchanged. 3. Prominent diffuse degenerative changes. 4. Prior resection involving the fourth and fifth rays. Electronically Signed   By:  Burman Nieves M.D.   On: 07/29/2023 17:20        Scheduled Meds:  [MAR Hold] aspirin EC  81 mg Oral Daily   [MAR Hold] atorvastatin  10 mg Oral Daily   [MAR Hold] fenofibrate  160 mg Oral Daily   [MAR Hold] gabapentin  800 mg Oral TID   [MAR Hold] insulin aspart  0-24 Units Subcutaneous TID WC   [MAR Hold] insulin glargine  40 Units Subcutaneous Daily   [MAR Hold] living well with diabetes book   Does not apply Once   [MAR Hold] lumateperone tosylate  42 mg Oral Daily   [MAR Hold] pantoprazole  40 mg Oral QAC breakfast   [MAR Hold] ramipril  2.5 mg Oral Daily   [MAR Hold]  venlafaxine XR  150 mg Oral Q breakfast   Continuous Infusions:  [MAR Hold] ampicillin-sulbactam (UNASYN) IV 3 g (07/31/23 0630)   [MAR Hold] vancomycin 1,250 mg (07/31/23 2440)     LOS: 2 days    Time spent: 35 minutes    Ramiro Harvest, MD [facility]   To contact the attending provider between 7A-7P or the covering provider during after hours 7P-7A, please log into the web site [email or web address] and access using universal [place] password for that web site. If you do not have the password, please call the hospital operator.  07/31/2023, 9:31 AM

## 2023-07-31 NOTE — Op Note (Signed)
 Full Operative Report  Date of Operation: 11:34 AM, 07/31/2023   Patient: [name] - 58 y.o. male  Surgeon: Pilar Plate, DPM   Assistant: None  Diagnosis: osteomyelitis left foot  Procedure:  1.  I&D with resection  fifth metatarsal, left foot 2.  Application dissolvable antibiotic calcium sulfate beads, left foot 3.  Application of acellular dermal graft 38 cm left foot 4.  Complex wound closure, 6 x 1 x 1.5 cm, left foot    Anesthesia: General  Kaylyn Layer, MD  Anesthesiologist: Kaylyn Layer, MD CRNA: Kayleen Memos, CRNA   Estimated Blood Loss: 20 mL  Hemostasis: 1) Anatomical dissection, mechanical compression, electrocautery 2) no tourniquet was used during procedure  Implants: Implant Name Type Inv. Item Serial No. Manufacturer Lot No. LRB No. Used Action  GRAFT SKIN WND MICRO 38 - [id number] Tissue GRAFT SKIN WND MICRO 38  [employer] [id number] Left 1 Implanted    Materials: Prolene and skin staples  Injectables: 1) Pre-operatively: 10 cc 0.5% marcaine plain 2) Post-operatively: None   Specimens: Pathology: Fifth metatarsal base microbiology: Bone culture fifth metatarsal base and separately tissue culture fifth metatarsal ulceration   Antibiotics: IV antibiotics given per schedule on the floor  Drains: None  Complications: Patient tolerated the procedure well without complication.   Operative findings: As below in detailed report  Indications for Procedure: [name] presents to Pilar Plate, DPM with a chief complaint of ulceration with draining sinus and osteomyelitis of the fifth metatarsal base on the left foot.  Patient has a history of CMT and varus foot deformity.  The patient has failed conservative treatments of various modalities. At this time the patient has elected to proceed with surgical correction. All alternatives, risks, and complications of the procedures were thoroughly explained to  the patient. Patient exhibits appropriate understanding of all discussion points and informed consent was signed and obtained in the chart with no guarantees to surgical outcome given or implied.  Description of Procedure: Patient was brought to the operating room. Patient remained on their hospital bed in the supine position. A surgical timeout was performed and all members of the operating room, the procedure, and the surgical site were identified. anesthesia occurred as per anesthesia record. Local anesthetic as previously described was then injected about the operative field in a local infiltrative block.  The operative lower extremity as noted above was then prepped and draped in the usual sterile manner. The following procedure then began.  Attention was directed to the fifth metatarsal base on the left foot.  Longitudinal incision was made at the plantar lateral aspect of the left foot.  The ulceration present was excised in total and a tissue culture was sent from the ulceration and tissue underlying.  The plan.  This is the fifth metatarsal base was dissected dorsal and plantar with 15 blade so as to dissect the entire remaining fifth metatarsal bone.  At the proximal aspect of the bone at the styloid process area there was noted to be degeneration and fracture with necrotic bone present.  There was no purulent fluid noted though. A bone sample was harvested and sent to the back table to be sent for micro.  Next the remaining fifth metatarsal was dissected dorsal and plantar and disarticulated at the tarsometatarsal joint.  The entire remaining fifth metatarsal bone was passed the back table and sent for pathology.  Next the surgical cavity was irrigated with 3 L of sterile saline via  power pulse lavage.  Hemostasis was then achieved with electrocautery.  Next antibiotic beads including calcium sulfate dissolvable beads mixed with vancomycin and tobramycin repaired on the back table.  Small beads  were made.  These were then mixed with below graft and implanted in the surgical cavity.  As the wound had been excised and the surgical cavity appeared clean with no purulence or evidence of residual infection decision was made to perform primary closure.  Therefore decision was made to use 38 cm of micronized dermal allograft to aid in healing and fill dead space.  The Kerecis graft was mixed with the antibiotic beads as above and that was implanted within the surgical site.  No graft was wasted.  Postdebridement the surgical site measured 6 x 1 x 1.5 cm.  Next tissue undermining was performed with 15 blade service to free up the dorsal and plantar tissue flaps.  Then the tissue flaps were brought into approximation under minimal tension and closed with Prolene suture as well as skin staples.  No bleeding was noted at completion of procedure  The surgical site was then dressed with betadine adaptic 4x4 kerlix and ace wrap. The patient tolerated both the procedure and anesthesia well with vital signs stable throughout. The patient was transferred in good condition and all vital signs stable  from the OR to recovery under the discretion of anesthesia.  Condition: Vital signs stable, neurovascular status unchanged from preoperative   Surgical plan:  Closed surgical site with no further surgical plans.  Recommend nonweightbearing to the left lower extremity.  Await culture data prior to determining PO abx plan. Expect source control at this time.   The patient will be Nonweightbearing in a post op shoe to the operative limb until further instructed. The dressing is to remain clean, dry, and intact. Will continue to follow unless noted elsewhere.   [name], DPM [facility]

## 2023-07-31 NOTE — Progress Notes (Signed)
 Orthopedic Tech Progress Note Patient Details:  [name] Jul 29, 1965 [id number]  Ortho Devices Type of Ortho Device: Postop shoe/boot Ortho Device/Splint Location: LLE Ortho Device/Splint Interventions: Application, Ordered   Post Interventions Patient Tolerated: Well Instructions Provided: Adjustment of device  [name] 07/31/2023, 12:12 PM

## 2023-08-01 DIAGNOSIS — G629 Polyneuropathy, unspecified: Secondary | ICD-10-CM | POA: Diagnosis not present

## 2023-08-01 DIAGNOSIS — M868X7 Other osteomyelitis, ankle and foot: Secondary | ICD-10-CM | POA: Diagnosis not present

## 2023-08-01 DIAGNOSIS — G894 Chronic pain syndrome: Secondary | ICD-10-CM | POA: Diagnosis not present

## 2023-08-01 DIAGNOSIS — E876 Hypokalemia: Secondary | ICD-10-CM | POA: Diagnosis not present

## 2023-08-01 LAB — GLUCOSE, CAPILLARY
Glucose-Capillary: 91 mg/dL (ref 70–99)
Glucose-Capillary: 87 mg/dL (ref 70–99)
Glucose-Capillary: 41 mg/dL — CL (ref 70–99)
Glucose-Capillary: 70 mg/dL (ref 70–99)
Glucose-Capillary: 227 mg/dL — ABNORMAL HIGH (ref 70–99)

## 2023-08-01 LAB — CBC
HCT: 32.3 % — ABNORMAL LOW (ref 39.0–52.0)
Hemoglobin: 11 g/dL — ABNORMAL LOW (ref 13.0–17.0)
MCH: 28.6 pg (ref 26.0–34.0)
MCHC: 34.1 g/dL (ref 30.0–36.0)
MCV: 84.1 fL (ref 80.0–100.0)
Platelets: 229 10*3/uL (ref 150–400)
RBC: 3.84 MIL/uL — ABNORMAL LOW (ref 4.22–5.81)
RDW: 13.6 % (ref 11.5–15.5)
WBC: 7.7 10*3/uL (ref 4.0–10.5)
nRBC: 0 % (ref 0.0–0.2)

## 2023-08-01 LAB — AEROBIC CULTURE W GRAM STAIN (SUPERFICIAL SPECIMEN): Gram Stain: NONE SEEN

## 2023-08-01 LAB — BASIC METABOLIC PANEL
Anion gap: 10 (ref 5–15)
BUN: 10 mg/dL (ref 6–20)
CO2: 26 mmol/L (ref 22–32)
Calcium: 9.4 mg/dL (ref 8.9–10.3)
Chloride: 104 mmol/L (ref 98–111)
Creatinine, Ser: 1.15 mg/dL (ref 0.61–1.24)
GFR, Estimated: >60 mL/min (ref >60)
Glucose, Bld: 109 mg/dL — ABNORMAL HIGH (ref 70–99)
Potassium: 3.7 mmol/L (ref 3.5–5.1)
Sodium: 140 mmol/L (ref 135–145)

## 2023-08-01 MED ORDER — SODIUM CHLORIDE 0.9 % IV SOLN
2.0000 g | Freq: Three times a day (TID) | INTRAVENOUS | Status: DC
  Administered 2023-08-01 – 2023-08-03 (×6): 2 g via INTRAVENOUS
  Filled 2023-08-01 (×6): qty 12.5

## 2023-08-01 MED ORDER — METRONIDAZOLE 500 MG PO TABS
500.0000 mg | ORAL_TABLET | Freq: Two times a day (BID) | ORAL | Status: DC
  Administered 2023-08-01 – 2023-08-03 (×4): 500 mg via ORAL
  Filled 2023-08-01 (×4): qty 1

## 2023-08-01 MED ORDER — INSULIN GLARGINE 100 UNIT/ML ~~LOC~~ SOLN
40.0000 [IU] | Freq: Every day | SUBCUTANEOUS | Status: DC
  Administered 2023-08-02 – 2023-08-05 (×4): 40 [IU] via SUBCUTANEOUS
  Filled 2023-08-01 (×4): qty 0.4

## 2023-08-01 MED ORDER — METRONIDAZOLE 500 MG/100ML IV SOLN: 500.0000 mg | Freq: Two times a day (BID) | INTRAVENOUS | Status: DC

## 2023-08-01 NOTE — Progress Notes (Signed)
 PROGRESS NOTE    [name]  AVW:[id number] DOB: January 16, 1966 DOA: 07/29/2023 PCP: [name], MD    Chief Complaint  Patient presents with   Foot Pain    Brief Narrative:  Patient is a 58 year old gentleman history of ADHD, depression, type 2 diabetes, hypertension presented to the ED due to concerns for osteomyelitis.  Patient with left foot pain has been followed at the wound care center for several months but no significant healing.  Patient developed worsening ulceration and drainage [facility].  Patient also noted to be hypokalemic.  Patient admitted for further evaluation for osteomyelitis.  Podiatry consulted.  Patient to the OR 07/31/2023 per podiatry.   Assessment & Plan:   Principal Problem:   Osteomyelitis (HCC) Active Problems:   Chronic pain syndrome   Diabetic peripheral neuropathy associated with type 2 diabetes mellitus (HCC)   Essential hypertension   Neuropathy   Gastroesophageal reflux disease with esophagitis without hemorrhage   Hypokalemia   1 osteomyelitis of left residual fifth metatarsal base with draining ulceration -Patient noted to have presented to the ED with worsening left lower extremity wound with worsening ulceration and drainage with notable ulceration of the lateral left foot. -Patient with poorly controlled type 2 diabetes. -Plain films of the left foot with concern for osteomyelitis. -MRI of the left foot with acute osteomyelitis of the residual fifth metatarsal, small fourth and fifth TMT joint effusion which may be reactive or reflect septic arthritis. -Superficial cultures obtained in ED with and an Enterobacter species, moderate Staphylococcus aureus, abundant Corynebacterium stratum pansensitive.  -Bilateral ABI normal. -Was on IV vancomycin, IV Unasyn.  -Patient seen in consultation by podiatry and patient for left fifth metatarsal base resection today, 07/31/2023, per  podiatry. -Continue current wound care recommendations per podiatry. -WBAT left foot. -Patient status post I&D with resection of fifth metatarsal, left foot, 07/31/2023. -Deep surgical cultures obtained and pending. -Discussed with ID, discontinue IV vancomycin and IV Unasyn and placed empirically on IV cefepime and Flagyl pending surgical culture results. -Per orthopedics.  2.  Poorly controlled type 2 diabetes mellitus -Hemoglobin A1c 10.9 (07/30/2023). -CBG 91 this morning. -Patient noted to have repeat CBG done this afternoon as low as 41. -Patient states has not had his insulin in several months due to insurance issues and being unable to follow-up with his endocrinologist. -Lantus initially increased to 50 units daily and will decrease to 40 units daily.   -Continue SSI, Neurontin.  -Outpatient follow-up with endocrinology.  3.  Hyperlipidemia -Continue fenofibrate, Lipitor.  4.  CAD -Continue Lipitor, fenofibrate, ramipril, aspirin.  5.  Chronic pain -Gabapentin.   6.  GERD -PPI.  7.  Hypertension -Continue ramipril.  8.  Hypokalemia -Repleted, potassium at 3.7 today. -Repeat labs in the AM.  DVT prophylaxis: None in anticipation of surgery today, postop DVT prophylaxis per podiatry. Code Status: Full Family Communication: Updated patient.  No family at bedside. Disposition: TBD  Status is: Inpatient Remains inpatient appropriate because: Severity of illness   Consultants:  Podiatry: Dr. Annamary Rummage 07/30/2023 Wound care RN 07/30/2023  Procedures:  Plain films of the left foot 07/29/2023 MRI left foot 07/30/2023 ABI with TBI 07/30/2023 I&D with resection fifth metatarsal, left foot/application dissolvable antibiotic calcium sulfate beads, left foot/application of acellular dermal graft 38 cm left foot/complex wound closure 6 x 1 x 1.5 cm, left foot per podiatry: Dr. Annamary Rummage 07/31/2023  Antimicrobials:  Anti-infectives (From admission, onward)    Start  Dose/Rate Route Frequency Ordered Stop   08/01/23 1445  metroNIDAZOLE (FLAGYL) IVPB 500 mg        500 mg 100 mL/hr over 60 Minutes Intravenous Every 12 hours 08/01/23 1400     07/31/23 1050  vancomycin (VANCOCIN) powder  Status:  Discontinued          As needed 07/31/23 1145 07/31/23 1145   07/31/23 1050  tobramycin (NEBCIN) injection  Status:  Discontinued          As needed 07/31/23 1145 07/31/23 1145   07/30/23 1800  Ampicillin-Sulbactam (UNASYN) 3 g in sodium chloride 0.9 % 100 mL IVPB  Status:  Discontinued        3 g 200 mL/hr over 30 Minutes Intravenous Every 6 hours 07/30/23 0829 08/01/23 1400   07/30/23 0600  vancomycin (VANCOREADY) IVPB 1250 mg/250 mL  Status:  Discontinued        1,250 mg 166.7 mL/hr over 90 Minutes Intravenous Every 12 hours 07/29/23 2352 08/01/23 1400   07/30/23 0100  piperacillin-tazobactam (ZOSYN) IVPB 3.375 g  Status:  Discontinued        3.375 g 12.5 mL/hr over 240 Minutes Intravenous Every 8 hours 07/29/23 2352 07/30/23 0829   07/29/23 1745  vancomycin (VANCOCIN) IVPB 1000 mg/200 mL premix       Placed in "Followed by" Linked Group   1,000 mg 200 mL/hr over 60 Minutes Intravenous  Once 07/29/23 1630 07/29/23 1925   07/29/23 1630  vancomycin (VANCOCIN) IVPB 1000 mg/200 mL premix  Status:  Discontinued        1,000 mg 200 mL/hr over 60 Minutes Intravenous  Once 07/29/23 1623 07/29/23 1626   07/29/23 1630  piperacillin-tazobactam (ZOSYN) IVPB 3.375 g        3.375 g 100 mL/hr over 30 Minutes Intravenous  Once 07/29/23 1623 07/29/23 1731   07/29/23 1630  vancomycin (VANCOCIN) IVPB 1000 mg/200 mL premix       Placed in "Followed by" Linked Group   1,000 mg 200 mL/hr over 60 Minutes Intravenous  Once 07/29/23 1630 07/29/23 1843         Subjective: Patient laying in bed.  Denies any chest pain or shortness of breath.  No abdominal pain.  Stated noticed some blood on postop dressing on his foot.     Objective: Vitals:   07/31/23 1243 07/31/23 1505  07/31/23 2118 08/01/23 1109  BP: 135/76 118/77 106/69 (!) 140/93  Pulse: (!) 55 83 75 73  Resp: 18 18 19 17   Temp: 98.7 F (37.1 C) 98.4 F (36.9 C) 99.7 F (37.6 C) 98.2 F (36.8 C)  TempSrc: Oral Oral Oral Oral  SpO2: 100% 95% 97% 100%  Weight:      Height:        Intake/Output Summary (Last 24 hours) at 08/01/2023 1400 Last data filed at 08/01/2023 0559 Gross per 24 hour  Intake 412.92 ml  Output 1050 ml  Net -637.08 ml   Filed Weights   07/29/23 2044  Weight: 94.8 kg    Examination:  General exam: NAD Respiratory system: CTAB.  No wheezes, no crackles, no rhonchi.  Fair air movement. Speaking in full sentences.   Cardiovascular system: Regular rate rhythm no murmurs rubs or gallops.  No JVD.  No lower extremity edema. Gastrointestinal system: Abdomen is soft, nontender, nondistended, positive bowel sounds.  No rebound.  No guarding.  Central nervous system: Alert and oriented. No focal neurological deficits. Extremities: Left foot with postop bandage noted with some serosanguineous fluid  noted. Skin: No rashes, lesions or ulcers Psychiatry: Judgement and insight appear normal. Mood & affect appropriate.     Data Reviewed: I have personally reviewed following labs and imaging studies  CBC: Recent Labs  Lab 07/29/23 1518 07/30/23 0837 07/31/23 0543 08/01/23 0450  WBC 10.2 7.4 8.4 7.7  NEUTROABS 7.0 4.7  --   --   HGB 12.7* 11.1* 12.3* 11.0*  HCT 36.6* 31.1* 36.0* 32.3*  MCV 83.0 82.3 84.3 84.1  PLT 303 218 276 229    Basic Metabolic Panel: Recent Labs  Lab 07/29/23 1518 07/30/23 0837 07/31/23 0543 08/01/23 0450  NA 137 135 140 140  K 2.7* 3.9 3.7 3.7  CL 99 100 103 104  CO2 28 24 24 26   GLUCOSE 53* 234* 157* 109*  BUN 13 7 9 10   CREATININE 1.07 1.02 1.09 1.15  CALCIUM 10.1 8.9 9.3 9.4  MG 1.9 1.7 2.2  --     GFR: Estimated Creatinine Clearance: 84.9 mL/min (by C-G formula based on SCr of 1.15 mg/dL).  Liver Function Tests: Recent Labs  Lab  07/29/23 1518  AST 26  ALT 25  ALKPHOS 65  BILITOT 0.6  PROT 7.7  ALBUMIN 4.3    CBG: Recent Labs  Lab 07/31/23 1236 07/31/23 1610 07/31/23 2121 08/01/23 0626 08/01/23 1107  GLUCAP 135* 265* 95 91 87     Recent Results (from the past 240 hours)  Blood Cultures x 2 sites     Status: None (Preliminary result)   Collection Time: 07/29/23  2:52 PM   Specimen: BLOOD LEFT ARM  Result Value Ref Range Status   Specimen Description BLOOD LEFT ARM  Final   Special Requests   Final    BOTTLES DRAWN AEROBIC AND ANAEROBIC Blood Culture adequate volume   Culture   Final    NO GROWTH 3 DAYS Performed at [facility], [address] [place], [place] [address]    Report Status PENDING  Incomplete  Blood Cultures x 2 sites     Status: None (Preliminary result)   Collection Time: 07/29/23  2:57 PM   Specimen: BLOOD RIGHT ARM  Result Value Ref Range Status   Specimen Description BLOOD RIGHT ARM  Final   Special Requests   Final    BOTTLES DRAWN AEROBIC AND ANAEROBIC Blood Culture adequate volume   Culture   Final    NO GROWTH 3 DAYS Performed at [facility], [address], [place], [place] [address]    Report Status PENDING  Incomplete  Aerobic Culture w Gram Stain (superficial specimen)     Status: None   Collection Time: 07/29/23  4:00 PM   Specimen: Wound  Result Value Ref Range Status   Specimen Description   Final    WOUND Performed at [facility], [address], [place], [place] [address]    Special Requests   Final    NONE Performed at [facility], [address] [place], [place] [address]    Gram Stain NO WBC SEEN RARE GRAM POSITIVE RODS   Final   Culture   Final    ABUNDANT ENTEROBACTER CLOACAE MODERATE STAPHYLOCOCCUS AUREUS ABUNDANT CORYNEBACTERIUM STRIATUM Standardized susceptibility testing for this organism is not available. Performed at [facility], [address] [place], [place]  [address]    Report Status 08/01/2023 FINAL  Final   Organism ID, Bacteria ENTEROBACTER CLOACAE  Final   Organism ID, Bacteria STAPHYLOCOCCUS AUREUS  Final  Susceptibility   Enterobacter cloacae - MIC*    CEFEPIME <=0.12 SENSITIVE Sensitive     CEFTAZIDIME <=1 SENSITIVE Sensitive     CIPROFLOXACIN <=0.25 SENSITIVE Sensitive     GENTAMICIN <=1 SENSITIVE Sensitive     IMIPENEM 0.5 SENSITIVE Sensitive     TRIMETH/SULFA <=20 SENSITIVE Sensitive     PIP/TAZO <=4 SENSITIVE Sensitive ug/mL    * ABUNDANT ENTEROBACTER CLOACAE   Staphylococcus aureus - MIC*    CIPROFLOXACIN <=0.5 SENSITIVE Sensitive     ERYTHROMYCIN <=0.25 SENSITIVE Sensitive     GENTAMICIN <=0.5 SENSITIVE Sensitive     OXACILLIN <=0.25 SENSITIVE Sensitive     TETRACYCLINE <=1 SENSITIVE Sensitive     VANCOMYCIN 1 SENSITIVE Sensitive     TRIMETH/SULFA <=10 SENSITIVE Sensitive     CLINDAMYCIN <=0.25 SENSITIVE Sensitive     RIFAMPIN <=0.5 SENSITIVE Sensitive     Inducible Clindamycin NEGATIVE Sensitive     LINEZOLID 2 SENSITIVE Sensitive     * MODERATE STAPHYLOCOCCUS AUREUS  Surgical pcr screen     Status: None   Collection Time: 07/31/23  8:00 AM   Specimen: Nasal Mucosa; Nasal Swab  Result Value Ref Range Status   MRSA, PCR NEGATIVE NEGATIVE Final   Staphylococcus aureus NEGATIVE NEGATIVE Final    Comment: (NOTE) The Xpert SA Assay (FDA approved for NASAL specimens in patients 46 years of age and older), is one component of a comprehensive surveillance program. It is not intended to diagnose infection nor to guide or monitor treatment. Performed at [facility], [address], [place], [place] [address]   Aerobic/Anaerobic Culture w Gram Stain (surgical/deep wound)     Status: None (Preliminary result)   Collection Time: 07/31/23 11:17 AM   Specimen: PATH Benign ortho; Tissue  Result Value Ref Range Status   Specimen Description BONE  Final   Special Requests LFT 5TH METATARS  Final   Gram Stain NO WBC  SEEN NO ORGANISMS SEEN   Final   Culture   Final    CULTURE REINCUBATED FOR BETTER GROWTH Performed at [facility], [address] [place], [place] [address]    Report Status PENDING  Incomplete  Aerobic/Anaerobic Culture w Gram Stain (surgical/deep wound)     Status: None (Preliminary result)   Collection Time: 07/31/23 11:28 AM   Specimen: PATH Benign ortho; Tissue  Result Value Ref Range Status   Specimen Description TISSUE  Final   Special Requests LEFT 5TH METATARS  Final   Gram Stain   Final    RARE WBC PRESENT, PREDOMINANTLY PMN RARE GRAM POSITIVE COCCI IN PAIRS    Culture   Final    CULTURE REINCUBATED FOR BETTER GROWTH Performed at [facility], [address], [place], [place] [address]    Report Status PENDING  Incomplete         Radiology Studies: DG Foot 2 Views Left Result Date: 07/31/2023 CLINICAL DATA:  [id number] Post-operative state 252351 EXAM: LEFT FOOT - 2 VIEW COMPARISON:  Radiographs 07/29/2023.  MRI 07/30/2023. FINDINGS: Two views are submitted. Patient has undergone apparent interval resection of the 5th metatarsal remnant. New skin staples are in place with new antibiotic pledgets. There are stable postsurgical changes from previous resection through the mid 4th metatarsal. No new bone destruction identified. The bones are diffusely demineralized with stable scattered degenerative changes. IMPRESSION: Interval resection of the 5th metatarsal remnant with new antibiotic pledgets in place. No new bone destruction identified. Electronically Signed   By: Chrissie Noa  Purcell Mouton M.D.   On: 07/31/2023 16:00        Scheduled Meds:  aspirin EC  81 mg Oral Daily   atorvastatin  10 mg Oral Daily   fenofibrate  160 mg Oral Daily   gabapentin  800 mg Oral TID   insulin aspart  0-24 Units Subcutaneous TID WC   insulin glargine  50 Units Subcutaneous Daily   living well with diabetes book   Does not apply Once   lumateperone tosylate  42 mg Oral Daily    pantoprazole  40 mg Oral QAC breakfast   ramipril  2.5 mg Oral Daily   venlafaxine XR  150 mg Oral Q breakfast   Continuous Infusions:  metronidazole       LOS: 3 days    Time spent: 35 minutes    Ramiro Harvest, MD [facility]   To contact the attending provider between 7A-7P or the covering provider during after hours 7P-7A, please log into the web site [email or web address] and access using universal [place] password for that web site. If you do not have the password, please call the hospital operator.  08/01/2023, 2:00 PM

## 2023-08-01 NOTE — Progress Notes (Signed)
  Subjective:  Patient ID: [name], male    DOB: 25-Aug-1965,  MRN: [id number]   DOS: 07/31/2023 Procedure: 1.  I&D with resection  fifth metatarsal, left foot 2.  Application dissolvable antibiotic calcium sulfate beads, left foot 3.  Application of acellular dermal graft 38 cm left foot 4.  Complex wound closure, 6 x 1 x 1.5 cm, left foot  58 y.o. male seen for post op check.  Patient reports he is doing well he denies pain he has remained nonweightbearing as instructed.  All questions and concerns were addressed  Review of Systems: Negative except as noted in the HPI. Denies N/V/F/Ch.   Objective:   Vitals:   07/31/23 1505 07/31/23 2118  BP: 118/77 106/69  Pulse: 83 75  Resp: 18 19  Temp: 98.4 F (36.9 C) 99.7 F (37.6 C)  SpO2: 95% 97%   Body mass index is 25.44 kg/m. Constitutional Well developed. Well nourished.  Vascular Foot warm and well perfused. Capillary refill normal to all digits.   No calf pain with palpation  Neurologic Normal speech. Oriented to person, place, and time. 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  POD # 1 s/p above procedures -Progressing as expected postoperatively -Dressing to remain clean dry and intact -He is cleared for discharge pending antibiotic plan.  I do believe he has source control with no further infected bone present.  Prelim cultures with GPC's in pairs.   -XR: Expected postop changes -WB Status: Nonweightbearing in soft dressing and postop shoe -Sutures: Remain  intact. -Medications/ABX: Continue IV ABX broad spectrum pending further culture data then narrowed to 7 days p.o. abx on discharge later today or tomorrow -Dressing remain clean dry and intact until follow-up in office in 1 week next week Thursday office will call to schedule follow-up -Will sign off at this time please call with any questions or concerns        Corinna Gab, DPM [facility] / [facility]

## 2023-08-01 NOTE — Progress Notes (Signed)
 Hypoglycemic Event  CBG: 41  Treatment: 8 oz juice/soda  Symptoms: Pale, Sweaty, Shaky, Hungry, and Nervous/irritable  Follow-up CBG: Time:1511 CBG Result:70  Possible Reasons for Event: Inadequate meal intake  Comments/MD notified:Dr. [name] made aware    [name]

## 2023-08-02 DIAGNOSIS — G629 Polyneuropathy, unspecified: Secondary | ICD-10-CM | POA: Diagnosis not present

## 2023-08-02 DIAGNOSIS — G894 Chronic pain syndrome: Secondary | ICD-10-CM | POA: Diagnosis not present

## 2023-08-02 DIAGNOSIS — M868X7 Other osteomyelitis, ankle and foot: Secondary | ICD-10-CM | POA: Diagnosis not present

## 2023-08-02 DIAGNOSIS — E876 Hypokalemia: Secondary | ICD-10-CM | POA: Diagnosis not present

## 2023-08-02 LAB — BASIC METABOLIC PANEL
Anion gap: 13 (ref 5–15)
BUN: 13 mg/dL (ref 6–20)
CO2: 24 mmol/L (ref 22–32)
Calcium: 9.5 mg/dL (ref 8.9–10.3)
Chloride: 100 mmol/L (ref 98–111)
Creatinine, Ser: 1.12 mg/dL (ref 0.61–1.24)
GFR, Estimated: >60 mL/min (ref >60)
Glucose, Bld: 203 mg/dL — ABNORMAL HIGH (ref 70–99)
Potassium: 3.8 mmol/L (ref 3.5–5.1)
Sodium: 137 mmol/L (ref 135–145)

## 2023-08-02 LAB — GLUCOSE, CAPILLARY
Glucose-Capillary: 199 mg/dL — ABNORMAL HIGH (ref 70–99)
Glucose-Capillary: 227 mg/dL — ABNORMAL HIGH (ref 70–99)
Glucose-Capillary: 167 mg/dL — ABNORMAL HIGH (ref 70–99)
Glucose-Capillary: 222 mg/dL — ABNORMAL HIGH (ref 70–99)

## 2023-08-02 LAB — CBC
HCT: 32.6 % — ABNORMAL LOW (ref 39.0–52.0)
Hemoglobin: 11.1 g/dL — ABNORMAL LOW (ref 13.0–17.0)
MCH: 28.9 pg (ref 26.0–34.0)
MCHC: 34 g/dL (ref 30.0–36.0)
MCV: 84.9 fL (ref 80.0–100.0)
Platelets: 245 10*3/uL (ref 150–400)
RBC: 3.84 MIL/uL — ABNORMAL LOW (ref 4.22–5.81)
RDW: 13.3 % (ref 11.5–15.5)
WBC: 6.8 10*3/uL (ref 4.0–10.5)
nRBC: 0 % (ref 0.0–0.2)

## 2023-08-02 MED ORDER — IBUPROFEN 200 MG PO TABS
600.0000 mg | ORAL_TABLET | Freq: Once | ORAL | Status: AC
  Administered 2023-08-02: 600 mg via ORAL
  Filled 2023-08-02: qty 3

## 2023-08-02 MED ORDER — INSULIN ASPART 100 UNIT/ML IJ SOLN
3.0000 [IU] | Freq: Once | INTRAMUSCULAR | Status: AC
  Administered 2023-08-02: 3 [IU] via SUBCUTANEOUS

## 2023-08-02 NOTE — Plan of Care (Signed)
  Problem: Skin Integrity: Goal: Risk for impaired skin integrity will decrease Outcome: Not Progressing   Problem: Safety: Goal: Ability to remain free from injury will improve Outcome: Not Progressing   Problem: Pain Managment: Goal: General experience of comfort will improve and/or be controlled Outcome: Not Progressing   Problem: Elimination: Goal: Will not experience complications related to bowel motility Outcome: Not Progressing

## 2023-08-02 NOTE — Progress Notes (Signed)
 PROGRESS NOTE    [name]  ZOX:[id number] DOB: December 05, 1965 DOA: 07/29/2023 PCP: [name], MD    Chief Complaint  Patient presents with   Foot Pain    Brief Narrative:  Patient is a 58 year old gentleman history of ADHD, depression, type 2 diabetes, hypertension presented to the ED due to concerns for osteomyelitis.  Patient with left foot pain has been followed at the wound care center for several months but no significant healing.  Patient developed worsening ulceration and drainage [facility].  Patient also noted to be hypokalemic.  Patient admitted for further evaluation for osteomyelitis.  Podiatry consulted.  Patient to the OR 07/31/2023 per podiatry.   Assessment & Plan:   Principal Problem:   Osteomyelitis (HCC) Active Problems:   Chronic pain syndrome   Diabetic peripheral neuropathy associated with type 2 diabetes mellitus (HCC)   Essential hypertension   Neuropathy   Gastroesophageal reflux disease with esophagitis without hemorrhage   Hypokalemia   1 osteomyelitis of left residual fifth metatarsal base with draining ulceration -Patient noted to have presented to the ED with worsening left lower extremity wound with worsening ulceration and drainage with notable ulceration of the lateral left foot. -Patient with poorly controlled type 2 diabetes. -Plain films of the left foot with concern for osteomyelitis. -MRI of the left foot with acute osteomyelitis of the residual fifth metatarsal, small fourth and fifth TMT joint effusion which may be reactive or reflect septic arthritis. -Superficial cultures obtained in ED with and an Enterobacter species, moderate Staphylococcus aureus, abundant Corynebacterium stratum pansensitive.  -Bilateral ABI normal. -Was on IV vancomycin, IV Unasyn.  -Patient seen in consultation by podiatry and patient for left fifth metatarsal base resection today, 07/31/2023, per  podiatry. -Continue current wound care recommendations per podiatry. -WBAT left foot. -Patient status post I&D with resection of fifth metatarsal, left foot, 07/31/2023. -Deep surgical cultures obtained and pending with preliminary results with gram-negative rods.. -Wound cultures obtained on presentation with Enterobacter cloacae and Staph aureus. -Discussed case with ID, discontinued IV vancomycin and IV Unasyn and placed empirically on IV cefepime and Flagyl pending surgical culture results. -Per podiatry  2.  Poorly controlled type 2 diabetes mellitus -Hemoglobin A1c 10.9 (07/30/2023). -CBG 199 this morning. -Patient noted to have repeat CBG done the afternoon of 08/01/2023, as low as 41. -Patient states has not had his insulin in several months due to insurance issues and being unable to follow-up with his endocrinologist. -Lantus initially increased to 50 units daily and decrease down to 40 units daily which we will continue for now.   -Continue SSI, Neurontin.  -Outpatient follow-up with endocrinology.  3.  Hyperlipidemia -Continue fenofibrate, Lipitor.  4.  CAD -Continue Lipitor, fenofibrate, ramipril, aspirin.  5.  Chronic pain -Gabapentin.   6.  GERD -Continue PPI.  7.  Hypertension -Continue ramipril.  8.  Hypokalemia -Repleted, potassium at 3.8 today. -Repeat labs in the AM.  DVT prophylaxis: None in anticipation of surgery today, postop DVT prophylaxis per podiatry. Code Status: Full Family Communication: Updated patient.  No family at bedside. Disposition: TBD  Status is: Inpatient Remains inpatient appropriate because: Severity of illness   Consultants:  Podiatry: Dr. Annamary Rummage 07/30/2023 Wound care RN 07/30/2023  Procedures:  Plain films of the left foot 07/29/2023 MRI left foot 07/30/2023 ABI with TBI 07/30/2023 I&D with resection fifth metatarsal, left foot/application dissolvable antibiotic calcium sulfate beads, left foot/application of acellular  dermal graft 38 cm left foot/complex wound closure  6 x 1 x 1.5 cm, left foot per podiatry: Dr. Annamary Rummage 07/31/2023  Antimicrobials:  Anti-infectives (From admission, onward)    Start     Dose/Rate Route Frequency Ordered Stop   08/01/23 2200  metroNIDAZOLE (FLAGYL) tablet 500 mg        500 mg Oral Every 12 hours 08/01/23 1402     08/01/23 1500  ceFEPIme (MAXIPIME) 2 g in sodium chloride 0.9 % 100 mL IVPB        2 g 200 mL/hr over 30 Minutes Intravenous Every 8 hours 08/01/23 1402     08/01/23 1445  metroNIDAZOLE (FLAGYL) IVPB 500 mg  Status:  Discontinued        500 mg 100 mL/hr over 60 Minutes Intravenous Every 12 hours 08/01/23 1400 08/01/23 1402   07/31/23 1050  vancomycin (VANCOCIN) powder  Status:  Discontinued          As needed 07/31/23 1145 07/31/23 1145   07/31/23 1050  tobramycin (NEBCIN) injection  Status:  Discontinued          As needed 07/31/23 1145 07/31/23 1145   07/30/23 1800  Ampicillin-Sulbactam (UNASYN) 3 g in sodium chloride 0.9 % 100 mL IVPB  Status:  Discontinued        3 g 200 mL/hr over 30 Minutes Intravenous Every 6 hours 07/30/23 0829 08/01/23 1400   07/30/23 0600  vancomycin (VANCOREADY) IVPB 1250 mg/250 mL  Status:  Discontinued        1,250 mg 166.7 mL/hr over 90 Minutes Intravenous Every 12 hours 07/29/23 2352 08/01/23 1400   07/30/23 0100  piperacillin-tazobactam (ZOSYN) IVPB 3.375 g  Status:  Discontinued        3.375 g 12.5 mL/hr over 240 Minutes Intravenous Every 8 hours 07/29/23 2352 07/30/23 0829   07/29/23 1745  vancomycin (VANCOCIN) IVPB 1000 mg/200 mL premix       Placed in "Followed by" Linked Group   1,000 mg 200 mL/hr over 60 Minutes Intravenous  Once 07/29/23 1630 07/29/23 1925   07/29/23 1630  vancomycin (VANCOCIN) IVPB 1000 mg/200 mL premix  Status:  Discontinued        1,000 mg 200 mL/hr over 60 Minutes Intravenous  Once 07/29/23 1623 07/29/23 1626   07/29/23 1630  piperacillin-tazobactam (ZOSYN) IVPB 3.375 g        3.375 g 100  mL/hr over 30 Minutes Intravenous  Once 07/29/23 1623 07/29/23 1731   07/29/23 1630  vancomycin (VANCOCIN) IVPB 1000 mg/200 mL premix       Placed in "Followed by" Linked Group   1,000 mg 200 mL/hr over 60 Minutes Intravenous  Once 07/29/23 1630 07/29/23 1843         Subjective: Patient laying in bed, eating breakfast.  Patient states he started having multiple stools which are not quite watery yet.  Denies any chest pain or shortness of breath.  No abdominal pain.  Tolerating current diet.  Complains of neck pain and headache.  Still with pain in the left foot.  Objective: Vitals:   08/01/23 1656 08/01/23 1940 08/02/23 0500 08/02/23 0758  BP: (!) 144/81 (!) 147/87 136/76 (!) 105/57  Pulse: 81 73 68 64  Resp: 17 16 14    Temp: 99.3 F (37.4 C) 99.2 F (37.3 C) 98.9 F (37.2 C) (!) 97.4 F (36.3 C)  TempSrc: Oral Oral Axillary Oral  SpO2: 100% 99% 100% 97%  Weight:      Height:        Intake/Output Summary (Last 24 hours)  at 08/02/2023 1128 Last data filed at 08/01/2023 2127 Gross per 24 hour  Intake 1218.23 ml  Output --  Net 1218.23 ml   Filed Weights   07/29/23 2044  Weight: 94.8 kg    Examination:  General exam: NAD Respiratory system: CTAB.  No wheezes, no crackles, no rhonchi.  Fair air movement.  Speaking in full sentences.  Cardiovascular system: RRR no murmurs rubs or gallops.  No JVD.  No lower extremity edema.  Gastrointestinal system: Abdomen is soft, nontender, nondistended, positive bowel sounds.  No rebound.  No guarding.   Central nervous system: Alert and oriented. No focal neurological deficits. Extremities: Left foot with postop bandage noted with some dried serosanguineous fluid noted. Skin: No rashes, lesions or ulcers Psychiatry: Judgement and insight appear normal. Mood & affect appropriate.     Data Reviewed: I have personally reviewed following labs and imaging studies  CBC: Recent Labs  Lab 07/29/23 1518 07/30/23 0837 07/31/23 0543  08/01/23 0450 08/02/23 0521  WBC 10.2 7.4 8.4 7.7 6.8  NEUTROABS 7.0 4.7  --   --   --   HGB 12.7* 11.1* 12.3* 11.0* 11.1*  HCT 36.6* 31.1* 36.0* 32.3* 32.6*  MCV 83.0 82.3 84.3 84.1 84.9  PLT 303 218 276 229 245    Basic Metabolic Panel: Recent Labs  Lab 07/29/23 1518 07/30/23 0837 07/31/23 0543 08/01/23 0450 08/02/23 0521  NA 137 135 140 140 137  K 2.7* 3.9 3.7 3.7 3.8  CL 99 100 103 104 100  CO2 28 24 24 26 24   GLUCOSE 53* 234* 157* 109* 203*  BUN 13 7 9 10 13   CREATININE 1.07 1.02 1.09 1.15 1.12  CALCIUM 10.1 8.9 9.3 9.4 9.5  MG 1.9 1.7 2.2  --   --     GFR: Estimated Creatinine Clearance: 87.2 mL/min (by C-G formula based on SCr of 1.12 mg/dL).  Liver Function Tests: Recent Labs  Lab 07/29/23 1518  AST 26  ALT 25  ALKPHOS 65  BILITOT 0.6  PROT 7.7  ALBUMIN 4.3    CBG: Recent Labs  Lab 08/01/23 1107 08/01/23 1457 08/01/23 1511 08/01/23 2117 08/02/23 0602  GLUCAP 87 41* 70 227* 199*     Recent Results (from the past 240 hours)  Blood Cultures x 2 sites     Status: None (Preliminary result)   Collection Time: 07/29/23  2:52 PM   Specimen: BLOOD LEFT ARM  Result Value Ref Range Status   Specimen Description BLOOD LEFT ARM  Final   Special Requests   Final    BOTTLES DRAWN AEROBIC AND ANAEROBIC Blood Culture adequate volume   Culture   Final    NO GROWTH 4 DAYS Performed at [facility], [address] [place], [place] [address]    Report Status PENDING  Incomplete  Blood Cultures x 2 sites     Status: None (Preliminary result)   Collection Time: 07/29/23  2:57 PM   Specimen: BLOOD RIGHT ARM  Result Value Ref Range Status   Specimen Description BLOOD RIGHT ARM  Final   Special Requests   Final    BOTTLES DRAWN AEROBIC AND ANAEROBIC Blood Culture adequate volume   Culture   Final    NO GROWTH 4 DAYS Performed at [facility], [address], [place], [place] [address]    Report Status PENDING  Incomplete  Aerobic Culture w  Gram Stain (superficial specimen)     Status: None   Collection Time: 07/29/23  4:00 PM   Specimen: Wound  Result Value Ref Range Status   Specimen Description   Final    WOUND Performed at [facility], [address] [place], [place] [address]    Special Requests   Final    NONE Performed at [facility], [address] [place], [place] [address]    Gram Stain NO WBC SEEN RARE GRAM POSITIVE RODS   Final   Culture   Final    ABUNDANT ENTEROBACTER CLOACAE MODERATE STAPHYLOCOCCUS AUREUS ABUNDANT CORYNEBACTERIUM STRIATUM Standardized susceptibility testing for this organism is not available. Performed at [facility], [address], [place], [place] [address]    Report Status 08/01/2023 FINAL  Final   Organism ID, Bacteria ENTEROBACTER CLOACAE  Final   Organism ID, Bacteria STAPHYLOCOCCUS AUREUS  Final      Susceptibility   Enterobacter cloacae - MIC*    CEFEPIME <=0.12 SENSITIVE Sensitive     CEFTAZIDIME <=1 SENSITIVE Sensitive     CIPROFLOXACIN <=0.25 SENSITIVE Sensitive     GENTAMICIN <=1 SENSITIVE Sensitive     IMIPENEM 0.5 SENSITIVE Sensitive     TRIMETH/SULFA <=20 SENSITIVE Sensitive     PIP/TAZO <=4 SENSITIVE Sensitive ug/mL    * ABUNDANT ENTEROBACTER CLOACAE   Staphylococcus aureus - MIC*    CIPROFLOXACIN <=0.5 SENSITIVE Sensitive     ERYTHROMYCIN <=0.25 SENSITIVE Sensitive     GENTAMICIN <=0.5 SENSITIVE Sensitive     OXACILLIN <=0.25 SENSITIVE Sensitive     TETRACYCLINE <=1 SENSITIVE Sensitive     VANCOMYCIN 1 SENSITIVE Sensitive     TRIMETH/SULFA <=10 SENSITIVE Sensitive     CLINDAMYCIN <=0.25 SENSITIVE Sensitive     RIFAMPIN <=0.5 SENSITIVE Sensitive     Inducible Clindamycin NEGATIVE Sensitive     LINEZOLID 2 SENSITIVE Sensitive     * MODERATE STAPHYLOCOCCUS AUREUS  Surgical pcr screen     Status: None   Collection Time: 07/31/23  8:00 AM   Specimen: Nasal Mucosa; Nasal Swab  Result Value Ref Range Status    MRSA, PCR NEGATIVE NEGATIVE Final   Staphylococcus aureus NEGATIVE NEGATIVE Final    Comment: (NOTE) The Xpert SA Assay (FDA approved for NASAL specimens in patients 38 years of age and older), is one component of a comprehensive surveillance program. It is not intended to diagnose infection nor to guide or monitor treatment. Performed at [facility], [address] [place], [place] [address]   Aerobic/Anaerobic Culture w Gram Stain (surgical/deep wound)     Status: None (Preliminary result)   Collection Time: 07/31/23 11:17 AM   Specimen: PATH Benign ortho; Tissue  Result Value Ref Range Status   Specimen Description BONE  Final   Special Requests LFT 5TH METATARS  Final   Gram Stain NO WBC SEEN NO ORGANISMS SEEN   Final   Culture   Final    CULTURE REINCUBATED FOR BETTER GROWTH Performed at [facility], [address] [place], [place] [address]    Report Status PENDING  Incomplete  Aerobic/Anaerobic Culture w Gram Stain (surgical/deep wound)     Status: None (Preliminary result)   Collection Time: 07/31/23 11:28 AM   Specimen: PATH Benign ortho; Tissue  Result Value Ref Range Status   Specimen Description TISSUE  Final   Special Requests LEFT 5TH METATARS  Final   Gram Stain   Final    RARE WBC PRESENT, PREDOMINANTLY PMN RARE GRAM POSITIVE COCCI IN PAIRS    Culture   Final  FEW GRAM NEGATIVE RODS CULTURE REINCUBATED FOR BETTER GROWTH Performed at [facility], [address], [place], [place] [address]    Report Status PENDING  Incomplete         Radiology Studies: DG Foot 2 Views Left Result Date: 07/31/2023 CLINICAL DATA:  [id number] Post-operative state 252351 EXAM: LEFT FOOT - 2 VIEW COMPARISON:  Radiographs 07/29/2023.  MRI 07/30/2023. FINDINGS: Two views are submitted. Patient has undergone apparent interval resection of the 5th metatarsal remnant. New skin staples are in place with new antibiotic pledgets. There are stable postsurgical  changes from previous resection through the mid 4th metatarsal. No new bone destruction identified. The bones are diffusely demineralized with stable scattered degenerative changes. IMPRESSION: Interval resection of the 5th metatarsal remnant with new antibiotic pledgets in place. No new bone destruction identified. Electronically Signed   By: Carey Bullocks M.D.   On: 07/31/2023 16:00        Scheduled Meds:  aspirin EC  81 mg Oral Daily   atorvastatin  10 mg Oral Daily   fenofibrate  160 mg Oral Daily   gabapentin  800 mg Oral TID   insulin aspart  0-24 Units Subcutaneous TID WC   insulin glargine  40 Units Subcutaneous Daily   living well with diabetes book   Does not apply Once   lumateperone tosylate  42 mg Oral Daily   metroNIDAZOLE  500 mg Oral Q12H   pantoprazole  40 mg Oral QAC breakfast   ramipril  2.5 mg Oral Daily   venlafaxine XR  150 mg Oral Q breakfast   Continuous Infusions:  ceFEPime (MAXIPIME) IV 2 g (08/02/23 8657)     LOS: 4 days    Time spent: 35 minutes    Ramiro Harvest, MD [facility]   To contact the attending provider between 7A-7P or the covering provider during after hours 7P-7A, please log into the web site [email or web address] and access using universal [place] password for that web site. If you do not have the password, please call the hospital operator.  08/02/2023, 11:28 AM

## 2023-08-03 ENCOUNTER — Other Ambulatory Visit: Payer: Self-pay

## 2023-08-03 ENCOUNTER — Encounter (HOSPITAL_COMMUNITY): Payer: Self-pay | Admitting: Podiatry

## 2023-08-03 DIAGNOSIS — M868X7 Other osteomyelitis, ankle and foot: Secondary | ICD-10-CM | POA: Diagnosis not present

## 2023-08-03 DIAGNOSIS — G894 Chronic pain syndrome: Secondary | ICD-10-CM | POA: Diagnosis not present

## 2023-08-03 DIAGNOSIS — I808 Phlebitis and thrombophlebitis of other sites: Secondary | ICD-10-CM

## 2023-08-03 DIAGNOSIS — G629 Polyneuropathy, unspecified: Secondary | ICD-10-CM | POA: Diagnosis not present

## 2023-08-03 DIAGNOSIS — E876 Hypokalemia: Secondary | ICD-10-CM | POA: Diagnosis not present

## 2023-08-03 LAB — BASIC METABOLIC PANEL
Anion gap: 8 (ref 5–15)
BUN: 15 mg/dL (ref 6–20)
CO2: 29 mmol/L (ref 22–32)
Calcium: 9.8 mg/dL (ref 8.9–10.3)
Chloride: 103 mmol/L (ref 98–111)
Creatinine, Ser: 1.25 mg/dL — ABNORMAL HIGH (ref 0.61–1.24)
GFR, Estimated: >60 mL/min (ref >60)
Glucose, Bld: 164 mg/dL — ABNORMAL HIGH (ref 70–99)
Potassium: 5.1 mmol/L (ref 3.5–5.1)
Sodium: 140 mmol/L (ref 135–145)

## 2023-08-03 LAB — CULTURE, BLOOD (ROUTINE X 2)
Culture: NO GROWTH
Culture: NO GROWTH
Special Requests: ADEQUATE
Special Requests: ADEQUATE

## 2023-08-03 LAB — CBC
HCT: 34.5 % — ABNORMAL LOW (ref 39.0–52.0)
Hemoglobin: 11.7 g/dL — ABNORMAL LOW (ref 13.0–17.0)
MCH: 29 pg (ref 26.0–34.0)
MCHC: 33.9 g/dL (ref 30.0–36.0)
MCV: 85.4 fL (ref 80.0–100.0)
Platelets: 294 10*3/uL (ref 150–400)
RBC: 4.04 MIL/uL — ABNORMAL LOW (ref 4.22–5.81)
RDW: 13.5 % (ref 11.5–15.5)
WBC: 8 10*3/uL (ref 4.0–10.5)
nRBC: 0 % (ref 0.0–0.2)

## 2023-08-03 LAB — GLUCOSE, CAPILLARY
Glucose-Capillary: 157 mg/dL — ABNORMAL HIGH (ref 70–99)
Glucose-Capillary: 165 mg/dL — ABNORMAL HIGH (ref 70–99)
Glucose-Capillary: 173 mg/dL — ABNORMAL HIGH (ref 70–99)
Glucose-Capillary: 184 mg/dL — ABNORMAL HIGH (ref 70–99)
Glucose-Capillary: 173 mg/dL — ABNORMAL HIGH (ref 70–99)

## 2023-08-03 LAB — SURGICAL PATHOLOGY

## 2023-08-03 MED ORDER — VANCOMYCIN HCL 1250 MG/250ML IV SOLN
1250.0000 mg | Freq: Two times a day (BID) | INTRAVENOUS | Status: DC
  Administered 2023-08-03 – 2023-08-06 (×6): 1250 mg via INTRAVENOUS
  Filled 2023-08-03 (×6): qty 250

## 2023-08-03 MED ORDER — SODIUM CHLORIDE 0.9 % IV BOLUS
500.0000 mL | Freq: Once | INTRAVENOUS | Status: AC
  Administered 2023-08-03: 500 mL via INTRAVENOUS

## 2023-08-03 MED ORDER — SULFAMETHOXAZOLE-TRIMETHOPRIM 800-160 MG PO TABS
2.0000 | ORAL_TABLET | Freq: Two times a day (BID) | ORAL | Status: DC
  Administered 2023-08-03 – 2023-08-06 (×8): 2 via ORAL
  Filled 2023-08-03 (×9): qty 2

## 2023-08-03 MED ORDER — SODIUM CHLORIDE 0.9 % IV SOLN: INTRAVENOUS | Status: AC

## 2023-08-03 MED ORDER — VANCOMYCIN HCL 1250 MG/250ML IV SOLN: 1250.0000 mg | Freq: Two times a day (BID) | INTRAVENOUS | Status: DC

## 2023-08-03 MED ORDER — ENOXAPARIN SODIUM 30 MG/0.3ML IJ SOSY
30.0000 mg | PREFILLED_SYRINGE | INTRAMUSCULAR | Status: DC
  Administered 2023-08-03 – 2023-08-06 (×4): 30 mg via SUBCUTANEOUS
  Filled 2023-08-03 (×4): qty 0.3

## 2023-08-03 NOTE — Progress Notes (Signed)
 Presents with Osteomyelitis left foot          -s/p I&D with resection  fifth metatarsal, left foot , 2/28   08/03/23 1635  TOC Brief Assessment  Insurance and Status Reviewed  Patient has primary care physician Yes  Home environment has been reviewed From home alone.  Prior level of function: PTA independent with ADL's. DME: knee scooter  Prior/Current Home Services No current home services  Social Drivers of Health Review SDOH reviewed no interventions necessary  Readmission risk has been reviewed No  Transition of care needs transition of care needs identified, TOC will continue to follow    ID following....bld cultures pending Pt states if home IV ABX needed he's agreeable. States has done home infusion in the past. Referral made with [facility] for potential need [facility] and IV ABX therapy). Gae Gallop RN,BSN,CM

## 2023-08-03 NOTE — Progress Notes (Signed)
 Chief Complaint  Patient presents with   Wound Check    RM#7 diabetic with left toe ulcer possible infection in foot.    Subjective:  58 y.o. male with PMHx of diabetes mellitus; uncontrolled, A1c 10.9 on 07/30/2023, presenting today for evaluation of an ulcer to the plantar lateral aspect of the left foot.  Apparently the patient was advised to go to the emergency department for evaluation and treatment of osteomyelitis to the left foot by the wound care center as well as Dr. Ardelle Anton within our practice via telephone.  Patient has been feeling fever chills and overall malaise over the past week.  Patient states that he went to the emergency department but the wait was too long so he left.  He presents today for further treatment and evaluation   Past Medical History:  Diagnosis Date   ADHD (attention deficit hyperactivity disorder)    Anxiety and depression    Candida esophagitis (HCC)    in setting of DKA/hyperglycemia   CIDP (chronic inflammatory demyelinating polyneuropathy) (HCC)    Depression    Diabetes type 2, controlled (HCC)    Diverticulosis    Essential hypertension 06/21/2014   Hyperlipidemia    Kidney stones    Pancreatitis    Stomach ulcer    Vitamin D deficiency     Past Surgical History:  Procedure Laterality Date   ANKLE SURGERY     CERVICAL DISC SURGERY     C4-C5   ELBOW SURGERY     ESOPHAGOGASTRODUODENOSCOPY N/A 07/16/2017   Procedure: ESOPHAGOGASTRODUODENOSCOPY (EGD);  Surgeon: [name], MD;  Location: [facility] ENDOSCOPY;  Service: Endoscopy;  Laterality: N/A;   IRRIGATION AND DEBRIDEMENT FOOT Left 07/31/2023   Procedure: IRRIGATION AND DEBRIDEMENT FOOT AND FIFTH MET RESCTION;  Surgeon: Pilar Plate, DPM;  Location: [facility] OR;  Service: Orthopedics/Podiatry;  Laterality: Left;   RETINAL DETACHMENT SURGERY     RHINOPLASTY     VASECTOMY     WOUND DEBRIDEMENT Left 03/27/2018   Procedure: DEBRIDEMENT WOUND;  Surgeon: Vivi Barrack, DPM;   Location: [facility] OR;  Service: Podiatry;  Laterality: Left;    Allergies  Allergen Reactions   Lithium Other (See Comments)    "Made me feel like I was walking in chest-deep water. I didn't like it."   Adderall [Amphetamine-Dextroamphetamine] Palpitations and Other (See Comments)    Hyperactivity, also   Ritalin [Methylphenidate] Palpitations and Other (See Comments)    Hyperactivity, also    LT foot 07/29/2023  Objective/Physical Exam General: The patient is alert and oriented x3 in no acute distress.  Dermatology:  Wound #1 noted to the lateral aspect of the left foot measuring approximately 1.5 to 4.5 x 0.5 cm (LxWxD).   To the noted ulceration(s), there is no eschar. There is a moderate amount of slough, fibrin, and necrotic tissue noted.  No malodor.  Diffuse swelling and edema noted with erythema around the foot.  Concerning for cellulitis and underlying osteomyelitis.  Vascular: Skin is warm to touch  Neurological: Light touch and protective threshold diminished bilaterally.   Musculoskeletal Exam: Cavus foot deformity noted.  Patient ambulatory  Radiographic exam LT foot 08/03/2023: Prior history of distal metatarsal resections 4, 5 left foot.  No acute fractures or irregularities concerning for osteomyelitis.  Assessment: 1.  Ulcer left foot secondary to diabetes mellitus 2. diabetes mellitus w/ peripheral neuropathy 3.  Cellulitis left foot with concern of underlying osteomyelitis   Plan of Care:  -Patient evaluated.  X-rays reviewed -Due to  the patient's history of uncontrolled diabetes mellitus and being advised by both the wound care center and my partner I do advise the patient goes immediately to the emergency department for admission and full workup.  The patient seems somewhat impatient about the idea of having to wait in the emergency department.  Ultimately patient agreed that he would go to the emergency department -Dressings were applied to the foot -Podiatrist  on-call was notified -Will follow once the patient is admitted.  Recommend MRI     Felecia Shelling, DPM [facility]  Dr. Felecia Shelling, DPM    [address] [place], [place] [address]                Office [phone]  Fax [phone]

## 2023-08-03 NOTE — Progress Notes (Addendum)
 PROGRESS NOTE    [name]  ONG:[id number] DOB: Apr 14, 1966 DOA: 07/29/2023 PCP: [name], MD    Chief Complaint  Patient presents with   Foot Pain    Brief Narrative:  Patient is a 58 year old gentleman history of ADHD, depression, type 2 diabetes, hypertension presented to the ED due to concerns for osteomyelitis.  Patient with left foot pain has been followed at the wound care center for several months but no significant healing.  Patient developed worsening ulceration and drainage [facility].  Patient also noted to be hypokalemic.  Patient admitted for further evaluation for osteomyelitis.  Podiatry consulted.  Patient to the OR 07/31/2023 per podiatry.   Assessment & Plan:   Principal Problem:   Osteomyelitis (HCC) Active Problems:   Chronic pain syndrome   Diabetic peripheral neuropathy associated with type 2 diabetes mellitus (HCC)   Essential hypertension   Neuropathy   Gastroesophageal reflux disease with esophagitis without hemorrhage   Hypokalemia   Thrombophlebitis arm   1 osteomyelitis of left residual fifth metatarsal base with draining ulceration -Patient noted to have presented to the ED with worsening left lower extremity wound with worsening ulceration and drainage with notable ulceration of the lateral left foot. -Patient with poorly controlled type 2 diabetes. -Plain films of the left foot with concern for osteomyelitis. -MRI of the left foot with acute osteomyelitis of the residual fifth metatarsal, small fourth and fifth TMT joint effusion which may be reactive or reflect septic arthritis. -Superficial cultures obtained in ED with and an Enterobacter species, moderate Staphylococcus aureus, abundant Corynebacterium stratum pansensitive.  -Bilateral ABI normal. -Was on IV vancomycin, IV Unasyn.  -Patient seen in consultation by podiatry and patient for left fifth metatarsal base resection today,  07/31/2023, per podiatry. -Continue current wound care recommendations per podiatry. -WBAT left foot. -Patient status post I&D with resection of fifth metatarsal, left foot, 07/31/2023. -Deep surgical cultures obtained and Enterobacter cloacae noted. -Wound cultures obtained on presentation with Enterobacter cloacae and Staph aureus. -Discussed case initially with ID, discontinued IV vancomycin and IV Unasyn and placed empirically on IV cefepime and Flagyl pending surgical culture results. -Patient seen in formal consultation by ID today, blood cultures recommended and ordered, patient placed back on IV vancomycin, cefepime and Flagyl discontinued and patient placed on Bactrim with ID recommending 7 more days of antibiotics. -Per ID blood cultures remain negative vancomycin will be transition to cephadroxil. -ID and podiatry following and appreciate their input and recommendations.  2.  Poorly controlled type 2 diabetes mellitus -Hemoglobin A1c 10.9 (07/30/2023). -CBG 165 this morning. -Patient noted to have repeat CBG done the afternoon of 08/01/2023, as low as 41. -Patient states has not had his insulin in several months due to insurance issues and being unable to follow-up with his endocrinologist. -Lantus initially increased to 50 units daily and decrease down to 40 units daily which we will continue for now.   -Continue SSI, Neurontin.  -Outpatient follow-up with endocrinology.  3.  Hyperlipidemia -Continue Lipitor, fenofibrate.   4.  CAD -Continue Lipitor, fenofibrate, ramipril, aspirin.  5.  Chronic pain -Continue gabapentin.   6.  GERD -PPI.  7.  Hypertension -Be soft and as such we will discontinue ACE inhibitor for now.  -1 L IV fluid bolus then normal saline at 125 cc an hour for the next 24 hours.   8.  Hypokalemia -Repleted, potassium at 5.1 today. -Patient with soft BP and as such we will discontinue ACE  inhibitor for now. -Repeat labs in the AM.  9.  Left forearm  thrombophlebitis -Warm compresses.  DVT prophylaxis: Lovenox Code Status: Full Family Communication: Updated patient.  No family at bedside. Disposition: TBD  Status is: Inpatient Remains inpatient appropriate because: Severity of illness   Consultants:  Podiatry: Dr. Annamary Rummage 07/30/2023 Wound care RN 07/30/2023 ID: Dr. Renold Don 08/03/2023  Procedures:  Plain films of the left foot 07/29/2023 MRI left foot 07/30/2023 ABI with TBI 07/30/2023 I&D with resection fifth metatarsal, left foot/application dissolvable antibiotic calcium sulfate beads, left foot/application of acellular dermal graft 38 cm left foot/complex wound closure 6 x 1 x 1.5 cm, left foot per podiatry: Dr. Annamary Rummage 07/31/2023  Antimicrobials:  Anti-infectives (From admission, onward)    Start     Dose/Rate Route Frequency Ordered Stop   08/03/23 1500  vancomycin (VANCOREADY) IVPB 1250 mg/250 mL        1,250 mg 166.7 mL/hr over 90 Minutes Intravenous Every 12 hours 08/03/23 1219     08/03/23 1300  vancomycin (VANCOREADY) IVPB 1250 mg/250 mL  Status:  Discontinued        1,250 mg 166.7 mL/hr over 90 Minutes Intravenous Every 12 hours 08/03/23 1157 08/03/23 1219   08/03/23 1300  sulfamethoxazole-trimethoprim (BACTRIM DS) 800-160 MG per tablet 2 tablet        2 tablet Oral Every 12 hours 08/03/23 1209     08/01/23 2200  metroNIDAZOLE (FLAGYL) tablet 500 mg  Status:  Discontinued        500 mg Oral Every 12 hours 08/01/23 1402 08/03/23 1153   08/01/23 1500  ceFEPIme (MAXIPIME) 2 g in sodium chloride 0.9 % 100 mL IVPB  Status:  Discontinued        2 g 200 mL/hr over 30 Minutes Intravenous Every 8 hours 08/01/23 1402 08/03/23 1209   08/01/23 1445  metroNIDAZOLE (FLAGYL) IVPB 500 mg  Status:  Discontinued        500 mg 100 mL/hr over 60 Minutes Intravenous Every 12 hours 08/01/23 1400 08/01/23 1402   07/31/23 1050  vancomycin (VANCOCIN) powder  Status:  Discontinued          As needed 07/31/23 1145 07/31/23 1145   07/31/23  1050  tobramycin (NEBCIN) injection  Status:  Discontinued          As needed 07/31/23 1145 07/31/23 1145   07/30/23 1800  Ampicillin-Sulbactam (UNASYN) 3 g in sodium chloride 0.9 % 100 mL IVPB  Status:  Discontinued        3 g 200 mL/hr over 30 Minutes Intravenous Every 6 hours 07/30/23 0829 08/01/23 1400   07/30/23 0600  vancomycin (VANCOREADY) IVPB 1250 mg/250 mL  Status:  Discontinued        1,250 mg 166.7 mL/hr over 90 Minutes Intravenous Every 12 hours 07/29/23 2352 08/01/23 1400   07/30/23 0100  piperacillin-tazobactam (ZOSYN) IVPB 3.375 g  Status:  Discontinued        3.375 g 12.5 mL/hr over 240 Minutes Intravenous Every 8 hours 07/29/23 2352 07/30/23 0829   07/29/23 1745  vancomycin (VANCOCIN) IVPB 1000 mg/200 mL premix       Placed in "Followed by" Linked Group   1,000 mg 200 mL/hr over 60 Minutes Intravenous  Once 07/29/23 1630 07/29/23 1925   07/29/23 1630  vancomycin (VANCOCIN) IVPB 1000 mg/200 mL premix  Status:  Discontinued        1,000 mg 200 mL/hr over 60 Minutes Intravenous  Once 07/29/23 1623 07/29/23 1626   07/29/23 1630  piperacillin-tazobactam (ZOSYN) IVPB 3.375 g        3.375 g 100 mL/hr over 30 Minutes Intravenous  Once 07/29/23 1623 07/29/23 1731   07/29/23 1630  vancomycin (VANCOCIN) IVPB 1000 mg/200 mL premix       Placed in "Followed by" Linked Group   1,000 mg 200 mL/hr over 60 Minutes Intravenous  Once 07/29/23 1630 07/29/23 1843         Subjective: Patient laying in bed.  Some complaints of fatigue and weakness this morning on ambulation back and forth to the bathroom.  Denies any chest pain or shortness of breath denies any abdominal pain.  Feels left foot pain slowly improving.   Objective: Vitals:   08/02/23 1403 08/02/23 2118 08/03/23 0500 08/03/23 0703  BP: 115/79 (!) 142/83 (!) 98/59 99/61  Pulse: 81 76 (!) 57 (!) 57  Resp: 18 17 17    Temp: 98.3 F (36.8 C) 99.4 F (37.4 C) 99.1 F (37.3 C) (!) 97.3 F (36.3 C)  TempSrc: Oral Oral   Oral  SpO2: 98% 98% 98% 96%  Weight:      Height:        Intake/Output Summary (Last 24 hours) at 08/03/2023 1439 Last data filed at 08/03/2023 4782 Gross per 24 hour  Intake --  Output 2000 ml  Net -2000 ml   Filed Weights   07/29/23 2044  Weight: 94.8 kg    Examination:  General exam: NAD Respiratory system: Lungs clear to auscultation bilaterally.  No wheezes, no crackles, no rhonchi.  Fair air movement.  Speaking in full sentences.   Cardiovascular system: Regular rate rhythm no murmurs rubs or gallops.  No JVD.  No lower extremity edema. Gastrointestinal system: Abdomen is soft, nontender, nondistended, positive bowel sounds.  No rebound.  No guarding.    Central nervous system: Alert and oriented. No focal neurological deficits. Extremities: Left foot with postop bandage in place.  Left forearm with signs of thrombophlebitis.  Skin: Left forearm with thrombophlebitis.   Psychiatry: Judgement and insight appear normal. Mood & affect appropriate.     Data Reviewed: I have personally reviewed following labs and imaging studies  CBC: Recent Labs  Lab 07/29/23 1518 07/30/23 0837 07/31/23 0543 08/01/23 0450 08/02/23 0521 08/03/23 0625  WBC 10.2 7.4 8.4 7.7 6.8 8.0  NEUTROABS 7.0 4.7  --   --   --   --   HGB 12.7* 11.1* 12.3* 11.0* 11.1* 11.7*  HCT 36.6* 31.1* 36.0* 32.3* 32.6* 34.5*  MCV 83.0 82.3 84.3 84.1 84.9 85.4  PLT 303 218 276 229 245 294    Basic Metabolic Panel: Recent Labs  Lab 07/29/23 1518 07/30/23 0837 07/31/23 0543 08/01/23 0450 08/02/23 0521 08/03/23 0625  NA 137 135 140 140 137 140  K 2.7* 3.9 3.7 3.7 3.8 5.1  CL 99 100 103 104 100 103  CO2 28 24 24 26 24 29   GLUCOSE 53* 234* 157* 109* 203* 164*  BUN 13 7 9 10 13 15   CREATININE 1.07 1.02 1.09 1.15 1.12 1.25*  CALCIUM 10.1 8.9 9.3 9.4 9.5 9.8  MG 1.9 1.7 2.2  --   --   --     GFR: Estimated Creatinine Clearance: 78.1 mL/min (A) (by C-G formula based on SCr of 1.25 mg/dL (H)).  Liver  Function Tests: Recent Labs  Lab 07/29/23 1518  AST 26  ALT 25  ALKPHOS 65  BILITOT 0.6  PROT 7.7  ALBUMIN 4.3    CBG: Recent Labs  Lab 08/02/23  1613 08/02/23 2128 08/03/23 0646 08/03/23 0818 08/03/23 1119  GLUCAP 167* 222* 157* 165* 173*     Recent Results (from the past 240 hours)  Blood Cultures x 2 sites     Status: None   Collection Time: 07/29/23  2:52 PM   Specimen: BLOOD LEFT ARM  Result Value Ref Range Status   Specimen Description BLOOD LEFT ARM  Final   Special Requests   Final    BOTTLES DRAWN AEROBIC AND ANAEROBIC Blood Culture adequate volume   Culture   Final    NO GROWTH 5 DAYS Performed at [facility], [address] [place], [place] [address]    Report Status 08/03/2023 FINAL  Final  Blood Cultures x 2 sites     Status: None   Collection Time: 07/29/23  2:57 PM   Specimen: BLOOD RIGHT ARM  Result Value Ref Range Status   Specimen Description BLOOD RIGHT ARM  Final   Special Requests   Final    BOTTLES DRAWN AEROBIC AND ANAEROBIC Blood Culture adequate volume   Culture   Final    NO GROWTH 5 DAYS Performed at [facility], [address] [place], [place] [address]    Report Status 08/03/2023 FINAL  Final  Aerobic Culture w Gram Stain (superficial specimen)     Status: None   Collection Time: 07/29/23  4:00 PM   Specimen: Wound  Result Value Ref Range Status   Specimen Description   Final    WOUND Performed at [facility], [address], [place], [place] [address]    Special Requests   Final    NONE Performed at [facility], [address] [place], [place] [address]    Gram Stain NO WBC SEEN RARE GRAM POSITIVE RODS   Final   Culture   Final    ABUNDANT ENTEROBACTER CLOACAE MODERATE STAPHYLOCOCCUS AUREUS ABUNDANT CORYNEBACTERIUM STRIATUM Standardized susceptibility testing for this organism is not available. Performed at [facility], [address], [place],  [place] [address]    Report Status 08/01/2023 FINAL  Final   Organism ID, Bacteria ENTEROBACTER CLOACAE  Final   Organism ID, Bacteria STAPHYLOCOCCUS AUREUS  Final      Susceptibility   Enterobacter cloacae - MIC*    CEFEPIME <=0.12 SENSITIVE Sensitive     CEFTAZIDIME <=1 SENSITIVE Sensitive     CIPROFLOXACIN <=0.25 SENSITIVE Sensitive     GENTAMICIN <=1 SENSITIVE Sensitive     IMIPENEM 0.5 SENSITIVE Sensitive     TRIMETH/SULFA <=20 SENSITIVE Sensitive     PIP/TAZO <=4 SENSITIVE Sensitive ug/mL    * ABUNDANT ENTEROBACTER CLOACAE   Staphylococcus aureus - MIC*    CIPROFLOXACIN <=0.5 SENSITIVE Sensitive     ERYTHROMYCIN <=0.25 SENSITIVE Sensitive     GENTAMICIN <=0.5 SENSITIVE Sensitive     OXACILLIN <=0.25 SENSITIVE Sensitive     TETRACYCLINE <=1 SENSITIVE Sensitive     VANCOMYCIN 1 SENSITIVE Sensitive     TRIMETH/SULFA <=10 SENSITIVE Sensitive     CLINDAMYCIN <=0.25 SENSITIVE Sensitive     RIFAMPIN <=0.5 SENSITIVE Sensitive     Inducible Clindamycin NEGATIVE Sensitive     LINEZOLID 2 SENSITIVE Sensitive     * MODERATE STAPHYLOCOCCUS AUREUS  Surgical pcr screen     Status: None   Collection Time: 07/31/23  8:00 AM   Specimen: Nasal Mucosa; Nasal Swab  Result Value Ref Range Status   MRSA, PCR NEGATIVE NEGATIVE Final   Staphylococcus aureus NEGATIVE NEGATIVE Final  Comment: (NOTE) The Xpert SA Assay (FDA approved for NASAL specimens in patients 32 years of age and older), is one component of a comprehensive surveillance program. It is not intended to diagnose infection nor to guide or monitor treatment. Performed at [facility], [address] [place], [place] [address]   Aerobic/Anaerobic Culture w Gram Stain (surgical/deep wound)     Status: Abnormal (Preliminary result)   Collection Time: 07/31/23 11:17 AM   Specimen: PATH Benign ortho; Tissue  Result Value Ref Range Status   Specimen Description BONE  Final   Special Requests LFT 5TH METATARS  Final   Gram Stain    Final    NO WBC SEEN NO ORGANISMS SEEN Performed at [facility], [address], [place], [place] [address]    Culture (A)  Final    CORYNEBACTERIUM STRIATUM Standardized susceptibility testing for this organism is not available. NO ANAEROBES ISOLATED; CULTURE IN PROGRESS FOR 5 DAYS    Report Status PENDING  Incomplete  Aerobic/Anaerobic Culture w Gram Stain (surgical/deep wound)     Status: None (Preliminary result)   Collection Time: 07/31/23 11:28 AM   Specimen: PATH Benign ortho; Tissue  Result Value Ref Range Status   Specimen Description TISSUE  Final   Special Requests LEFT 5TH METATARS  Final   Gram Stain   Final    RARE WBC PRESENT, PREDOMINANTLY PMN RARE GRAM POSITIVE COCCI IN PAIRS Performed at [facility], [address] [place], [place] [address]    Culture   Final    FEW ENTEROBACTER CLOACAE FEW CORYNEBACTERIUM STRIATUM Standardized susceptibility testing for this organism is not available. NO ANAEROBES ISOLATED; CULTURE IN PROGRESS FOR 5 DAYS    Report Status PENDING  Incomplete   Organism ID, Bacteria ENTEROBACTER CLOACAE  Final      Susceptibility   Enterobacter cloacae - MIC*    CEFEPIME <=0.12 SENSITIVE Sensitive     CEFTAZIDIME <=1 SENSITIVE Sensitive     CIPROFLOXACIN <=0.25 SENSITIVE Sensitive     GENTAMICIN <=1 SENSITIVE Sensitive     IMIPENEM 0.5 SENSITIVE Sensitive     TRIMETH/SULFA <=20 SENSITIVE Sensitive     PIP/TAZO <=4 SENSITIVE Sensitive ug/mL    * FEW ENTEROBACTER CLOACAE         Radiology Studies: [place] EKG SITE RITE Result Date: 08/03/2023 If Site Rite image not attached, placement could not be confirmed due to current cardiac rhythm.        Scheduled Meds:  aspirin EC  81 mg Oral Daily   atorvastatin  10 mg Oral Daily   enoxaparin (LOVENOX) injection  30 mg Subcutaneous Q24H   fenofibrate  160 mg Oral Daily   gabapentin  800 mg Oral TID   insulin aspart  0-24 Units Subcutaneous TID WC   insulin glargine  40  Units Subcutaneous Daily   living well with diabetes book   Does not apply Once   lumateperone tosylate  42 mg Oral Daily   pantoprazole  40 mg Oral QAC breakfast   sulfamethoxazole-trimethoprim  2 tablet Oral Q12H   venlafaxine XR  150 mg Oral Q breakfast   Continuous Infusions:  sodium chloride 125 mL/hr at 08/03/23 1239   vancomycin 1,250 mg (08/03/23 1405)     LOS: 5 days    Time spent: 40 minutes    Ramiro Harvest, MD [facility]   To contact the attending provider between 7A-7P or the covering provider during after hours 7P-7A, please log into  the web site [email or web address] and access using universal [place] password for that web site. If you do not have the password, please call the hospital operator.  08/03/2023, 2:39 PM

## 2023-08-03 NOTE — Consult Note (Signed)
 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 Mri joint effusion suspect reactive rather than septic arthritis  S/p reection entire remaining 5th mt bone; bone cx enterobacter cloacae; coryne striatum  Thrombophlebitis changes left forearm today 3/3 and borderline bp. Will get bcx  Plan 7 days abx. Added vanc back pending repeat bcx  Plan: Bcx before vanc Start vanc Change cefepime/flagyl to bactrim Plan 7 day abx -- vanc will change to cephadroxil if the bcx negative Standard isolation precaution Discussed with primary team and podiatry       ------------------------------------------------ Principal Problem:   Osteomyelitis (HCC) Active Problems:   Chronic pain syndrome   Diabetic peripheral neuropathy associated with type 2 diabetes mellitus (HCC)   Essential hypertension   Neuropathy   Gastroesophageal reflux disease with esophagitis without hemorrhage   Hypokalemia    HPI: [name] is a 58 y.o. male with charcot marrie tooth, dm2 with peripheral neuropathy admitted for left foot om  Followed at wound care center no significant healing Afebrile Mri showed 5th mt om  S/p I&D 2/28; reviewed op note Resection 5th mt; placement abx bead  Path sent but no comment on margin  Micro showed corynebacterium striatum/enterobacter cloacae (bactrim/cipro)  Today developed left forearm peripheral iv associated thrombophlebitis changes; iv removed  Family History  Problem Relation Age of Onset   Stroke Father    Breast cancer Mother    Colon cancer Neg Hx     Social History   Tobacco  Use   Smoking status: Former   Smokeless tobacco: Former  Building services engineer status: Never Used  Substance Use Topics   Alcohol use: Yes    Comment: Socially   Drug use: No    Allergies  Allergen Reactions   Lithium Other (See Comments)    "Made me feel like I was walking in chest-deep water. I didn't like it."   Adderall [Amphetamine-Dextroamphetamine] Palpitations and Other (See Comments)    Hyperactivity, also   Ritalin [Methylphenidate] Palpitations and Other (See Comments)    Hyperactivity, also    Review of Systems: ROS All Other ROS was negative, except mentioned above   Past Medical History:  Diagnosis Date   ADHD (attention deficit hyperactivity disorder)    Anxiety and depression    Candida esophagitis (HCC)    in setting of DKA/hyperglycemia   CIDP (chronic inflammatory demyelinating polyneuropathy) (HCC)    Depression    Diabetes type 2, controlled (HCC)    Diverticulosis    Essential hypertension 06/21/2014   Hyperlipidemia    Kidney stones    Pancreatitis    Stomach ulcer    Vitamin D deficiency        Scheduled Meds:  aspirin EC  81 mg Oral Daily   atorvastatin  10 mg Oral Daily   fenofibrate  160 mg Oral Daily   gabapentin  800 mg Oral TID   insulin aspart  0-24 Units Subcutaneous TID WC   insulin glargine  40 Units Subcutaneous Daily   living well with diabetes book  Does not apply Once   lumateperone tosylate  42 mg Oral Daily   pantoprazole  40 mg Oral QAC breakfast   venlafaxine XR  150 mg Oral Q breakfast   Continuous Infusions:  sodium chloride     ceFEPime (MAXIPIME) IV 2 g (08/03/23 0620)   vancomycin     PRN Meds:.acetaminophen **OR** acetaminophen, ondansetron **OR** ondansetron (ZOFRAN) IV, oxyCODONE   OBJECTIVE: Blood pressure 99/61, pulse (!) 57, temperature (!) 97.3 F (36.3 C), temperature source Oral, resp. rate 17, height 6\' 4"  (1.93 m), weight 94.8 kg, SpO2 96%.  Physical Exam  General/constitutional: no  distress, pleasant HEENT: Normocephalic, PER, Conj Clear, EOMI, Oropharynx clear Neck supple CV: rrr no mrg Lungs: clear to auscultation, normal respiratory effort Abd: Soft, Nontender Ext: no edema Skin: left forearm linear tender erythematous streaky rash (line drawn 3/3) Neuro: nonfocal MSK: left foot in dressing    Lab Results Lab Results  Component Value Date   WBC 8.0 08/03/2023   HGB 11.7 (L) 08/03/2023   HCT 34.5 (L) 08/03/2023   MCV 85.4 08/03/2023   PLT 294 08/03/2023    Lab Results  Component Value Date   CREATININE 1.25 (H) 08/03/2023   BUN 15 08/03/2023   NA 140 08/03/2023   K 5.1 08/03/2023   CL 103 08/03/2023   CO2 29 08/03/2023    Lab Results  Component Value Date   ALT 25 07/29/2023   AST 26 07/29/2023   ALKPHOS 65 07/29/2023   BILITOT 0.6 07/29/2023      Microbiology: Recent Results (from the past 240 hours)  Blood Cultures x 2 sites     Status: None   Collection Time: 07/29/23  2:52 PM   Specimen: BLOOD LEFT ARM  Result Value Ref Range Status   Specimen Description BLOOD LEFT ARM  Final   Special Requests   Final    BOTTLES DRAWN AEROBIC AND ANAEROBIC Blood Culture adequate volume   Culture   Final    NO GROWTH 5 DAYS Performed at [facility], [address], [place], [place] [address]    Report Status 08/03/2023 FINAL  Final  Blood Cultures x 2 sites     Status: None   Collection Time: 07/29/23  2:57 PM   Specimen: BLOOD RIGHT ARM  Result Value Ref Range Status   Specimen Description BLOOD RIGHT ARM  Final   Special Requests   Final    BOTTLES DRAWN AEROBIC AND ANAEROBIC Blood Culture adequate volume   Culture   Final    NO GROWTH 5 DAYS Performed at [facility], [address] [place], [place] [address]    Report Status 08/03/2023 FINAL  Final  Aerobic Culture w Gram Stain (superficial specimen)     Status: None   Collection Time: 07/29/23  4:00 PM   Specimen: Wound  Result Value Ref Range Status   Specimen  Description   Final    WOUND Performed at [facility], [address], [place], [place] [address]    Special Requests   Final    NONE Performed at [facility], [address], [place], [place] [address]    Gram Stain NO WBC SEEN RARE GRAM POSITIVE RODS   Final   Culture   Final    ABUNDANT ENTEROBACTER CLOACAE MODERATE STAPHYLOCOCCUS AUREUS ABUNDANT CORYNEBACTERIUM STRIATUM Standardized susceptibility testing for this organism is not available. Performed at [facility], [address] [place], [place] [address]  Report Status 08/01/2023 FINAL  Final   Organism ID, Bacteria ENTEROBACTER CLOACAE  Final   Organism ID, Bacteria STAPHYLOCOCCUS AUREUS  Final      Susceptibility   Enterobacter cloacae - MIC*    CEFEPIME <=0.12 SENSITIVE Sensitive     CEFTAZIDIME <=1 SENSITIVE Sensitive     CIPROFLOXACIN <=0.25 SENSITIVE Sensitive     GENTAMICIN <=1 SENSITIVE Sensitive     IMIPENEM 0.5 SENSITIVE Sensitive     TRIMETH/SULFA <=20 SENSITIVE Sensitive     PIP/TAZO <=4 SENSITIVE Sensitive ug/mL    * ABUNDANT ENTEROBACTER CLOACAE   Staphylococcus aureus - MIC*    CIPROFLOXACIN <=0.5 SENSITIVE Sensitive     ERYTHROMYCIN <=0.25 SENSITIVE Sensitive     GENTAMICIN <=0.5 SENSITIVE Sensitive     OXACILLIN <=0.25 SENSITIVE Sensitive     TETRACYCLINE <=1 SENSITIVE Sensitive     VANCOMYCIN 1 SENSITIVE Sensitive     TRIMETH/SULFA <=10 SENSITIVE Sensitive     CLINDAMYCIN <=0.25 SENSITIVE Sensitive     RIFAMPIN <=0.5 SENSITIVE Sensitive     Inducible Clindamycin NEGATIVE Sensitive     LINEZOLID 2 SENSITIVE Sensitive     * MODERATE STAPHYLOCOCCUS AUREUS  Surgical pcr screen     Status: None   Collection Time: 07/31/23  8:00 AM   Specimen: Nasal Mucosa; Nasal Swab  Result Value Ref Range Status   MRSA, PCR NEGATIVE NEGATIVE Final   Staphylococcus aureus NEGATIVE NEGATIVE Final    Comment: (NOTE) The Xpert SA Assay (FDA approved for NASAL  specimens in patients 2 years of age and older), is one component of a comprehensive surveillance program. It is not intended to diagnose infection nor to guide or monitor treatment. Performed at [facility], [address] [place], [place] [address]   Aerobic/Anaerobic Culture w Gram Stain (surgical/deep wound)     Status: Abnormal (Preliminary result)   Collection Time: 07/31/23 11:17 AM   Specimen: PATH Benign ortho; Tissue  Result Value Ref Range Status   Specimen Description BONE  Final   Special Requests LFT 5TH METATARS  Final   Gram Stain   Final    NO WBC SEEN NO ORGANISMS SEEN Performed at [facility], [address] [place], [place] [address]    Culture (A)  Final    CORYNEBACTERIUM STRIATUM Standardized susceptibility testing for this organism is not available. NO ANAEROBES ISOLATED; CULTURE IN PROGRESS FOR 5 DAYS    Report Status PENDING  Incomplete  Aerobic/Anaerobic Culture w Gram Stain (surgical/deep wound)     Status: None (Preliminary result)   Collection Time: 07/31/23 11:28 AM   Specimen: PATH Benign ortho; Tissue  Result Value Ref Range Status   Specimen Description TISSUE  Final   Special Requests LEFT 5TH METATARS  Final   Gram Stain   Final    RARE WBC PRESENT, PREDOMINANTLY PMN RARE GRAM POSITIVE COCCI IN PAIRS Performed at [facility], [address] [place], [place] [address]    Culture   Final    FEW ENTEROBACTER CLOACAE FEW CORYNEBACTERIUM STRIATUM Standardized susceptibility testing for this organism is not available. NO ANAEROBES ISOLATED; CULTURE IN PROGRESS FOR 5 DAYS    Report Status PENDING  Incomplete   Organism ID, Bacteria ENTEROBACTER CLOACAE  Final      Susceptibility   Enterobacter cloacae - MIC*    CEFEPIME <=0.12 SENSITIVE Sensitive     CEFTAZIDIME <=1 SENSITIVE Sensitive     CIPROFLOXACIN <=0.25 SENSITIVE Sensitive     GENTAMICIN <=1  SENSITIVE Sensitive     IMIPENEM 0.5 SENSITIVE Sensitive      TRIMETH/SULFA <=20 SENSITIVE Sensitive     PIP/TAZO <=4 SENSITIVE Sensitive ug/mL    * FEW ENTEROBACTER CLOACAE     Serology:    Imaging: If present, new imagings (plain films, ct scans, and mri) have been personally visualized and interpreted; radiology reports have been reviewed. Decision making incorporated into the Impression / Recommendations.   2/27 left foot mri 1. Acute osteomyelitis of the residual fifth metatarsal. 2. Small fourth and fifth TMT joint effusion which may be reactive or reflect septic arthritis. 3. Bone marrow edema within the proximal phalanx of the third toe and possible nondisplaced fracture plane. 4. Soft tissue ulceration at the lateral aspect of the proximal forefoot overlying the proximal fifth metatarsal. Soft tissue edema and enhancement within the adjacent soft tissues compatible with cellulitis. No organized fluid collections. 5. Diffuse edema-like signal of the foot musculature which may represent changes related to denervation and/or myositis.     Raymondo Band, MD [facility] [phone] pager    08/03/2023, 12:01 PM

## 2023-08-03 NOTE — Inpatient Diabetes Management (Signed)
 Inpatient Diabetes Program Recommendations  AACE/ADA: New Consensus Statement on Inpatient Glycemic Control (2015)  Target Ranges:  Prepandial:   less than 140 mg/dL      Peak postprandial:   less than 180 mg/dL (1-2 hours)      Critically ill patients:  140 - 180 mg/dL   Lab Results  Component Value Date   GLUCAP 165 (H) 08/03/2023   HGBA1C 10.9 (H) 07/30/2023    Latest Reference Range & Units 08/02/23 06:02 08/02/23 11:29 08/02/23 16:13 08/02/23 21:28 08/03/23 06:46 08/03/23 08:18  Glucose-Capillary 70 - 99 mg/dL 161 (H) Novolog 4 units 227 (H) Novolog 8 units 167 (H) Novolog 4 units 222 (H) Novolog 3 units 157 (H) 165 (H) Novolog 4 units  (H): Data is abnormally high  Changes made in D/C recommendations based on current CBGs and can be modified as needed.  Discharge Recommendations: Other recommendations: Libre 3 plus sensors [id number] Long acting recommendations: Insulin Glargine (LANTUS) Solostar Pen 40 units daily  Short acting recommendations:  Meal + Correction coverage Insulin aspart (NOVOLOG) FlexPen  Sensitive Scale.  3 units tid meal coverage Hypoglycemia treatment recommendations: GVoke 1mg  Supply/Referral recommendations: Glucometer Test strips Lancet device Lancets Pen needles - standard   Use Adult Diabetes Insulin Treatment Post Discharge order set.  Please consider inpatient: -Add Novolog 2 units tid meal coverage if eats 50% meals -Decrease Novolog correction to 0-9 units tid, 0-5 units hs  Thank you, Darel Hong E. Gaylynn Seiple, RN, MSN, CDCES  Diabetes Coordinator Inpatient Glycemic Control Team Team Pager [phone] (8am-5pm) 08/03/2023 10:36 AM

## 2023-08-03 NOTE — Progress Notes (Addendum)
 Pharmacy Antibiotic Note  [name] is a 58 y.o. male admitted on 07/29/2023 with osteomyelitis of left residual fifth metatarsal s/p I and D 2/28. Cultures showing corynebacterium striatum , MSSA and enterobacter cloacae. Pharmacy has been consulted to re-start vancomycin. Last dose 3/1 AM at 0618. Scr up to 1.25 with CrCl 78 ml/min.    Plan: Re-initiate vancomycin 1250 mg every 12 hours  Predicted AUC 529 with Scr 1.25 Change cefepime to PO Bactrim 2 DS BID  Monitor renal function and vancomycin levels    Height: 6\' 4"  (193 cm) Weight: 94.8 kg (209 lb) IBW/kg (Calculated) : 86.8  Temp (24hrs), Avg:98.5 F (36.9 C), Min:97.3 F (36.3 C), Max:99.4 F (37.4 C)  Recent Labs  Lab 07/29/23 1518 07/30/23 0837 07/31/23 0543 08/01/23 0450 08/02/23 0521 08/03/23 0625  WBC 10.2 7.4 8.4 7.7 6.8 8.0  CREATININE 1.07 1.02 1.09 1.15 1.12 1.25*  LATICACIDVEN 1.8  --   --   --   --   --     Estimated Creatinine Clearance: 78.1 mL/min (A) (by C-G formula based on SCr of 1.25 mg/dL (H)).    Allergies  Allergen Reactions   Lithium Other (See Comments)    "Made me feel like I was walking in chest-deep water. I didn't like it."   Adderall [Amphetamine-Dextroamphetamine] Palpitations and Other (See Comments)    Hyperactivity, also   Ritalin [Methylphenidate] Palpitations and Other (See Comments)    Hyperactivity, also    Thank you for allowing pharmacy to be a part of this patient's care.  Sharin Mons, PharmD, BCPS, BCIDP Infectious Diseases Clinical Pharmacist Phone: [phone] 08/03/2023 11:44 AM

## 2023-08-04 DIAGNOSIS — E876 Hypokalemia: Secondary | ICD-10-CM | POA: Diagnosis not present

## 2023-08-04 DIAGNOSIS — M868X7 Other osteomyelitis, ankle and foot: Secondary | ICD-10-CM | POA: Diagnosis not present

## 2023-08-04 DIAGNOSIS — G894 Chronic pain syndrome: Secondary | ICD-10-CM | POA: Diagnosis not present

## 2023-08-04 DIAGNOSIS — G629 Polyneuropathy, unspecified: Secondary | ICD-10-CM | POA: Diagnosis not present

## 2023-08-04 LAB — CBC WITH DIFFERENTIAL/PLATELET
Abs Immature Granulocytes: 0.04 10*3/uL (ref 0.00–0.07)
Basophils Absolute: 0 10*3/uL (ref 0.0–0.1)
Basophils Relative: 1 %
Eosinophils Absolute: 0.3 10*3/uL (ref 0.0–0.5)
Eosinophils Relative: 3 %
HCT: 36.2 % — ABNORMAL LOW (ref 39.0–52.0)
Hemoglobin: 12.2 g/dL — ABNORMAL LOW (ref 13.0–17.0)
Immature Granulocytes: 1 %
Lymphocytes Relative: 28 %
Lymphs Abs: 2.1 10*3/uL (ref 0.7–4.0)
MCH: 28.8 pg (ref 26.0–34.0)
MCHC: 33.7 g/dL (ref 30.0–36.0)
MCV: 85.4 fL (ref 80.0–100.0)
Monocytes Absolute: 0.5 10*3/uL (ref 0.1–1.0)
Monocytes Relative: 7 %
Neutro Abs: 4.6 10*3/uL (ref 1.7–7.7)
Neutrophils Relative %: 60 %
Platelets: 350 10*3/uL (ref 150–400)
RBC: 4.24 MIL/uL (ref 4.22–5.81)
RDW: 13.4 % (ref 11.5–15.5)
WBC: 7.6 10*3/uL (ref 4.0–10.5)
nRBC: 0 % (ref 0.0–0.2)

## 2023-08-04 LAB — GLUCOSE, CAPILLARY
Glucose-Capillary: 147 mg/dL — ABNORMAL HIGH (ref 70–99)
Glucose-Capillary: 183 mg/dL — ABNORMAL HIGH (ref 70–99)
Glucose-Capillary: 120 mg/dL — ABNORMAL HIGH (ref 70–99)
Glucose-Capillary: 171 mg/dL — ABNORMAL HIGH (ref 70–99)

## 2023-08-04 LAB — BASIC METABOLIC PANEL
Anion gap: 8 (ref 5–15)
BUN: 15 mg/dL (ref 6–20)
CO2: 26 mmol/L (ref 22–32)
Calcium: 9.3 mg/dL (ref 8.9–10.3)
Chloride: 101 mmol/L (ref 98–111)
Creatinine, Ser: 1.22 mg/dL (ref 0.61–1.24)
GFR, Estimated: >60 mL/min (ref >60)
Glucose, Bld: 149 mg/dL — ABNORMAL HIGH (ref 70–99)
Potassium: 4.1 mmol/L (ref 3.5–5.1)
Sodium: 135 mmol/L (ref 135–145)

## 2023-08-04 MED ORDER — LIDOCAINE VISCOUS HCL 2 % MT SOLN
15.0000 mL | Freq: Once | OROMUCOSAL | Status: AC
  Administered 2023-08-04: 15 mL via OROMUCOSAL
  Filled 2023-08-04: qty 15

## 2023-08-04 NOTE — Progress Notes (Signed)
 PROGRESS NOTE    [name]  XBJ:[id number] DOB: January 13, 1966 DOA: 07/29/2023 PCP: [name], MD    Chief Complaint  Patient presents with   Foot Pain    Brief Narrative:  Patient is a 58 year old gentleman history of ADHD, depression, type 2 diabetes, hypertension presented to the ED due to concerns for osteomyelitis.  Patient with left foot pain has been followed at the wound care center for several months but no significant healing.  Patient developed worsening ulceration and drainage [facility].  Patient also noted to be hypokalemic.  Patient admitted for further evaluation for osteomyelitis.  Podiatry consulted.  Patient s/p I&D by podiatry on 07/31/2023.  ID following for antibiotic recommendations and duration.   Assessment & Plan:   Principal Problem:   Osteomyelitis (HCC) Active Problems:   Chronic pain syndrome   Diabetic peripheral neuropathy associated with type 2 diabetes mellitus (HCC)   Essential hypertension   Neuropathy   Gastroesophageal reflux disease with esophagitis without hemorrhage   Hypokalemia   Thrombophlebitis arm   1 osteomyelitis of left residual fifth metatarsal base with draining ulceration -Patient noted to have presented to the ED with worsening left lower extremity wound with worsening ulceration and drainage with notable ulceration of the lateral left foot. -Patient with poorly controlled type 2 diabetes. -Plain films of the left foot with concern for osteomyelitis. -MRI of the left foot with acute osteomyelitis of the residual fifth metatarsal, small fourth and fifth TMT joint effusion which may be reactive or reflect septic arthritis. -Superficial cultures obtained in ED with and an Enterobacter species, moderate Staphylococcus aureus, abundant Corynebacterium stratum pansensitive.  -Bilateral ABI normal. -Was on IV vancomycin, IV Unasyn.  -Patient seen in consultation by podiatry  and patient s/p left fifth metatarsal base resection, 07/31/2023, per podiatry. -Continue current wound care recommendations per podiatry. -WBAT left foot. -Patient status post I&D with resection of fifth metatarsal, left foot, 07/31/2023. -Deep surgical cultures obtained and Enterobacter cloacae noted. -Wound cultures obtained on presentation with Enterobacter cloacae and Staph aureus. -Discussed case initially with ID, discontinued IV vancomycin and IV Unasyn and placed empirically on IV cefepime and Flagyl pending surgical culture results. -Patient seen in formal consultation by ID, blood cultures recommended and ordered with no growth to date, patient placed back on IV vancomycin, cefepime and Flagyl discontinued and patient placed on Bactrim with ID recommending 7 more days of antibiotics. -ID discussed with podiatry, MRI joint effusion suspected to be reactive rather than septic arthritis. -ID following and recommending continuation of Vanco and p.o. Bactrim. -Per ID if blood cultures remain negative and left forearm thrombophlebitis has improved on Thursday, 08/06/2023 then patient may be discharged on Bactrim and cefadroxil to complete a course of antibiotic treatment on 08/10/2023. -ID was following but has signed off as of 08/04/2023. -Podiatry was following but signed off. -Will need outpatient follow-up with podiatry on discharge.  2.  Poorly controlled type 2 diabetes mellitus -Hemoglobin A1c 10.9 (07/30/2023). -CBG 147 this morning. -Patient noted to have repeat CBG done the afternoon of 08/01/2023, as low as 41. -Patient states has not had his insulin in several months due to insurance issues and being unable to follow-up with his endocrinologist. -Lantus initially increased to 50 units daily and decreased back down to 40 units daily which we will continue for now.   -Continue SSI, Neurontin.  -Outpatient follow-up with endocrinology.  3.  Hyperlipidemia -Continue Lipitor, fenofibrate.    4.  CAD -  Continue Lipitor, fenofibrate, aspirin.  5.  Chronic pain -Gabapentin.   6.  GERD -Continue PPI.  7.  Hypertension -Be soft and as such patient is ACE inhibitor discontinued.   -Continue hydration with IV fluids.  8.  Hypokalemia -Repleted, potassium of 4.1 today.  -BP noted to be soft on 08/03/2023 and as such patient is ACE inhibitor discontinued.   -Repeat labs in the AM.  9.  Left forearm thrombophlebitis -Warm compresses. -Continue current empiric IV antibiotics.  DVT prophylaxis: Lovenox Code Status: Full Family Communication: Updated patient.  No family at bedside. Disposition: Likely home in 48 to 72 hours if continued clinical improvement.  Status is: Inpatient Remains inpatient appropriate because: Severity of illness   Consultants:  Podiatry: Dr. Annamary Rummage 07/30/2023 Wound care RN 07/30/2023 ID: Dr. Renold Don 08/03/2023  Procedures:  Plain films of the left foot 07/29/2023 MRI left foot 07/30/2023 ABI with TBI 07/30/2023 I&D with resection fifth metatarsal, left foot/application dissolvable antibiotic calcium sulfate beads, left foot/application of acellular dermal graft 38 cm left foot/complex wound closure 6 x 1 x 1.5 cm, left foot per podiatry: Dr. Annamary Rummage 07/31/2023  Antimicrobials:  Anti-infectives (From admission, onward)    Start     Dose/Rate Route Frequency Ordered Stop   08/03/23 1500  vancomycin (VANCOREADY) IVPB 1250 mg/250 mL        1,250 mg 166.7 mL/hr over 90 Minutes Intravenous Every 12 hours 08/03/23 1219     08/03/23 1300  vancomycin (VANCOREADY) IVPB 1250 mg/250 mL  Status:  Discontinued        1,250 mg 166.7 mL/hr over 90 Minutes Intravenous Every 12 hours 08/03/23 1157 08/03/23 1219   08/03/23 1300  sulfamethoxazole-trimethoprim (BACTRIM DS) 800-160 MG per tablet 2 tablet        2 tablet Oral Every 12 hours 08/03/23 1209     08/01/23 2200  metroNIDAZOLE (FLAGYL) tablet 500 mg  Status:  Discontinued        500 mg Oral Every 12  hours 08/01/23 1402 08/03/23 1153   08/01/23 1500  ceFEPIme (MAXIPIME) 2 g in sodium chloride 0.9 % 100 mL IVPB  Status:  Discontinued        2 g 200 mL/hr over 30 Minutes Intravenous Every 8 hours 08/01/23 1402 08/03/23 1209   08/01/23 1445  metroNIDAZOLE (FLAGYL) IVPB 500 mg  Status:  Discontinued        500 mg 100 mL/hr over 60 Minutes Intravenous Every 12 hours 08/01/23 1400 08/01/23 1402   07/31/23 1050  vancomycin (VANCOCIN) powder  Status:  Discontinued          As needed 07/31/23 1145 07/31/23 1145   07/31/23 1050  tobramycin (NEBCIN) injection  Status:  Discontinued          As needed 07/31/23 1145 07/31/23 1145   07/30/23 1800  Ampicillin-Sulbactam (UNASYN) 3 g in sodium chloride 0.9 % 100 mL IVPB  Status:  Discontinued        3 g 200 mL/hr over 30 Minutes Intravenous Every 6 hours 07/30/23 0829 08/01/23 1400   07/30/23 0600  vancomycin (VANCOREADY) IVPB 1250 mg/250 mL  Status:  Discontinued        1,250 mg 166.7 mL/hr over 90 Minutes Intravenous Every 12 hours 07/29/23 2352 08/01/23 1400   07/30/23 0100  piperacillin-tazobactam (ZOSYN) IVPB 3.375 g  Status:  Discontinued        3.375 g 12.5 mL/hr over 240 Minutes Intravenous Every 8 hours 07/29/23 2352 07/30/23 0829   07/29/23 1745  vancomycin (VANCOCIN) IVPB 1000 mg/200 mL premix       Placed in "Followed by" Linked Group   1,000 mg 200 mL/hr over 60 Minutes Intravenous  Once 07/29/23 1630 07/29/23 1925   07/29/23 1630  vancomycin (VANCOCIN) IVPB 1000 mg/200 mL premix  Status:  Discontinued        1,000 mg 200 mL/hr over 60 Minutes Intravenous  Once 07/29/23 1623 07/29/23 1626   07/29/23 1630  piperacillin-tazobactam (ZOSYN) IVPB 3.375 g        3.375 g 100 mL/hr over 30 Minutes Intravenous  Once 07/29/23 1623 07/29/23 1731   07/29/23 1630  vancomycin (VANCOCIN) IVPB 1000 mg/200 mL premix       Placed in "Followed by" Linked Group   1,000 mg 200 mL/hr over 60 Minutes Intravenous  Once 07/29/23 1630 07/29/23 1843          Subjective: Laying in bed.  Some improvement with fatigue and weakness with IV fluids.  Denies any chest pain or shortness of breath.  Left foot pain slowly improving.    Objective: Vitals:   08/03/23 2123 08/04/23 0515 08/04/23 0911 08/04/23 1033  BP: 131/74 110/66 130/81 (!) 144/79  Pulse: 69 (!) 56 75 89  Resp: 16 14 16 18   Temp: 98.9 F (37.2 C) 97.9 F (36.6 C) 98.1 F (36.7 C) 98.2 F (36.8 C)  TempSrc:  Oral Oral   SpO2: 96% 100% 100% 100%  Weight:      Height:        Intake/Output Summary (Last 24 hours) at 08/04/2023 1435 Last data filed at 08/04/2023 1110 Gross per 24 hour  Intake 740 ml  Output 600 ml  Net 140 ml   Filed Weights   07/29/23 2044  Weight: 94.8 kg    Examination:  General exam: NAD Respiratory system: CTAB.  No wheezes, no crackles, no rhonchi.  Fair air movement.  Speaking in full sentences.    Cardiovascular system: RRR no murmurs rubs or gallops.  No JVD.  No lower extremity edema.   Gastrointestinal system: Abdomen is soft, nontender, nondistended, positive bowel sounds.  No rebound.  No guarding.   Central nervous system: Alert and oriented. No focal neurological deficits. Extremities: Left foot with postop bandage in place.  Left forearm with signs of thrombophlebitis which is improving.  Skin: Left forearm with improving thrombophlebitis.   Psychiatry: Judgement and insight appear normal. Mood & affect appropriate.     Data Reviewed: I have personally reviewed following labs and imaging studies  CBC: Recent Labs  Lab 07/29/23 1518 07/30/23 0837 07/31/23 0543 08/01/23 0450 08/02/23 0521 08/03/23 0625 08/04/23 0801  WBC 10.2 7.4 8.4 7.7 6.8 8.0 7.6  NEUTROABS 7.0 4.7  --   --   --   --  4.6  HGB 12.7* 11.1* 12.3* 11.0* 11.1* 11.7* 12.2*  HCT 36.6* 31.1* 36.0* 32.3* 32.6* 34.5* 36.2*  MCV 83.0 82.3 84.3 84.1 84.9 85.4 85.4  PLT 303 218 276 229 245 294 350    Basic Metabolic Panel: Recent Labs  Lab 07/29/23 1518  07/30/23 0837 07/31/23 0543 08/01/23 0450 08/02/23 0521 08/03/23 0625 08/04/23 0801  NA 137 135 140 140 137 140 135  K 2.7* 3.9 3.7 3.7 3.8 5.1 4.1  CL 99 100 103 104 100 103 101  CO2 28 24 24 26 24 29 26   GLUCOSE 53* 234* 157* 109* 203* 164* 149*  BUN 13 7 9 10 13 15 15   CREATININE 1.07 1.02 1.09 1.15 1.12 1.25*  1.22  CALCIUM 10.1 8.9 9.3 9.4 9.5 9.8 9.3  MG 1.9 1.7 2.2  --   --   --   --     GFR: Estimated Creatinine Clearance: 80 mL/min (by C-G formula based on SCr of 1.22 mg/dL).  Liver Function Tests: Recent Labs  Lab 07/29/23 1518  AST 26  ALT 25  ALKPHOS 65  BILITOT 0.6  PROT 7.7  ALBUMIN 4.3    CBG: Recent Labs  Lab 08/03/23 1119 08/03/23 1631 08/03/23 2140 08/04/23 0631 08/04/23 1123  GLUCAP 173* 184* 173* 147* 183*     Recent Results (from the past 240 hours)  Blood Cultures x 2 sites     Status: None   Collection Time: 07/29/23  2:52 PM   Specimen: BLOOD LEFT ARM  Result Value Ref Range Status   Specimen Description BLOOD LEFT ARM  Final   Special Requests   Final    BOTTLES DRAWN AEROBIC AND ANAEROBIC Blood Culture adequate volume   Culture   Final    NO GROWTH 5 DAYS Performed at [facility], [address], [place], [place] [address]    Report Status 08/03/2023 FINAL  Final  Blood Cultures x 2 sites     Status: None   Collection Time: 07/29/23  2:57 PM   Specimen: BLOOD RIGHT ARM  Result Value Ref Range Status   Specimen Description BLOOD RIGHT ARM  Final   Special Requests   Final    BOTTLES DRAWN AEROBIC AND ANAEROBIC Blood Culture adequate volume   Culture   Final    NO GROWTH 5 DAYS Performed at [facility], [address], [place], [place] [address]    Report Status 08/03/2023 FINAL  Final  Aerobic Culture w Gram Stain (superficial specimen)     Status: None   Collection Time: 07/29/23  4:00 PM   Specimen: Wound  Result Value Ref Range Status   Specimen Description   Final    WOUND Performed at [facility], [address], [place], [place] [address]    Special Requests   Final    NONE Performed at [facility], [address], [place], [place] [address]    Gram Stain NO WBC SEEN RARE GRAM POSITIVE RODS   Final   Culture   Final    ABUNDANT ENTEROBACTER CLOACAE MODERATE STAPHYLOCOCCUS AUREUS ABUNDANT CORYNEBACTERIUM STRIATUM Standardized susceptibility testing for this organism is not available. Performed at [facility], [address] [place], [place] [address]    Report Status 08/01/2023 FINAL  Final   Organism ID, Bacteria ENTEROBACTER CLOACAE  Final   Organism ID, Bacteria STAPHYLOCOCCUS AUREUS  Final      Susceptibility   Enterobacter cloacae - MIC*    CEFEPIME <=0.12 SENSITIVE Sensitive     CEFTAZIDIME <=1 SENSITIVE Sensitive     CIPROFLOXACIN <=0.25 SENSITIVE Sensitive     GENTAMICIN <=1 SENSITIVE Sensitive     IMIPENEM 0.5 SENSITIVE Sensitive     TRIMETH/SULFA <=20 SENSITIVE Sensitive     PIP/TAZO <=4 SENSITIVE Sensitive ug/mL    * ABUNDANT ENTEROBACTER CLOACAE   Staphylococcus aureus - MIC*    CIPROFLOXACIN <=0.5 SENSITIVE Sensitive     ERYTHROMYCIN <=0.25 SENSITIVE Sensitive     GENTAMICIN <=0.5 SENSITIVE Sensitive     OXACILLIN <=0.25 SENSITIVE Sensitive     TETRACYCLINE <=1 SENSITIVE Sensitive     VANCOMYCIN 1 SENSITIVE Sensitive     TRIMETH/SULFA <=10 SENSITIVE Sensitive  CLINDAMYCIN <=0.25 SENSITIVE Sensitive     RIFAMPIN <=0.5 SENSITIVE Sensitive     Inducible Clindamycin NEGATIVE Sensitive     LINEZOLID 2 SENSITIVE Sensitive     * MODERATE STAPHYLOCOCCUS AUREUS  Surgical pcr screen     Status: None   Collection Time: 07/31/23  8:00 AM   Specimen: Nasal Mucosa; Nasal Swab  Result Value Ref Range Status   MRSA, PCR NEGATIVE NEGATIVE Final   Staphylococcus aureus NEGATIVE NEGATIVE Final    Comment: (NOTE) The Xpert SA Assay (FDA approved for NASAL specimens in patients 74 years of age and older), is  one component of a comprehensive surveillance program. It is not intended to diagnose infection nor to guide or monitor treatment. Performed at [facility], [address], [place], [place] [address]   Aerobic/Anaerobic Culture w Gram Stain (surgical/deep wound)     Status: None (Preliminary result)   Collection Time: 07/31/23 11:17 AM   Specimen: PATH Benign ortho; Tissue  Result Value Ref Range Status   Specimen Description BONE  Final   Special Requests LFT 5TH METATARS  Final   Gram Stain   Final    NO WBC SEEN NO ORGANISMS SEEN Performed at [facility], [address], [place], [place] [address]    Culture   Final    RARE CORYNEBACTERIUM STRIATUM Standardized susceptibility testing for this organism is not available. NO ANAEROBES ISOLATED; CULTURE IN PROGRESS FOR 5 DAYS    Report Status PENDING  Incomplete  Aerobic/Anaerobic Culture w Gram Stain (surgical/deep wound)     Status: None (Preliminary result)   Collection Time: 07/31/23 11:28 AM   Specimen: PATH Benign ortho; Tissue  Result Value Ref Range Status   Specimen Description TISSUE  Final   Special Requests LEFT 5TH METATARS  Final   Gram Stain   Final    RARE WBC PRESENT, PREDOMINANTLY PMN RARE GRAM POSITIVE COCCI IN PAIRS Performed at [facility], [address], [place], [place] [address]    Culture   Final    FEW ENTEROBACTER CLOACAE FEW CORYNEBACTERIUM STRIATUM Standardized susceptibility testing for this organism is not available. NO ANAEROBES ISOLATED; CULTURE IN PROGRESS FOR 5 DAYS    Report Status PENDING  Incomplete   Organism ID, Bacteria ENTEROBACTER CLOACAE  Final      Susceptibility   Enterobacter cloacae - MIC*    CEFEPIME <=0.12 SENSITIVE Sensitive     CEFTAZIDIME <=1 SENSITIVE Sensitive     CIPROFLOXACIN <=0.25 SENSITIVE Sensitive     GENTAMICIN <=1 SENSITIVE Sensitive     IMIPENEM 0.5 SENSITIVE Sensitive     TRIMETH/SULFA <=20 SENSITIVE Sensitive     PIP/TAZO <=4  SENSITIVE Sensitive ug/mL    * FEW ENTEROBACTER CLOACAE  Culture, blood (Routine X 2) w Reflex to ID Panel     Status: None (Preliminary result)   Collection Time: 08/03/23 12:10 PM   Specimen: BLOOD  Result Value Ref Range Status   Specimen Description BLOOD SITE NOT SPECIFIED  Final   Special Requests   Final    BOTTLES DRAWN AEROBIC AND ANAEROBIC Blood Culture adequate volume   Culture   Final    NO GROWTH < 24 HOURS Performed at [facility], [address], [place], [place] [address]    Report Status PENDING  Incomplete  Culture, blood (Routine X 2) w Reflex to ID Panel     Status: None (Preliminary result)   Collection Time: 08/03/23 12:12 PM  Specimen: BLOOD  Result Value Ref Range Status   Specimen Description BLOOD SITE NOT SPECIFIED  Final   Special Requests   Final    BOTTLES DRAWN AEROBIC AND ANAEROBIC Blood Culture adequate volume   Culture   Final    NO GROWTH < 24 HOURS Performed at [facility], [address], [place], [place] [address]    Report Status PENDING  Incomplete         Radiology Studies: [place] EKG SITE RITE Result Date: 08/03/2023 If Site Rite image not attached, placement could not be confirmed due to current cardiac rhythm.        Scheduled Meds:  aspirin EC  81 mg Oral Daily   atorvastatin  10 mg Oral Daily   enoxaparin (LOVENOX) injection  30 mg Subcutaneous Q24H   fenofibrate  160 mg Oral Daily   gabapentin  800 mg Oral TID   insulin aspart  0-24 Units Subcutaneous TID WC   insulin glargine  40 Units Subcutaneous Daily   living well with diabetes book   Does not apply Once   lumateperone tosylate  42 mg Oral Daily   pantoprazole  40 mg Oral QAC breakfast   sulfamethoxazole-trimethoprim  2 tablet Oral Q12H   venlafaxine XR  150 mg Oral Q breakfast   Continuous Infusions:  vancomycin Stopped (08/04/23 0427)     LOS: 6 days    Time spent: 40 minutes    Ramiro Harvest, MD [facility]   To contact the  attending provider between 7A-7P or the covering provider during after hours 7P-7A, please log into the web site [email or web address] and access using universal [place] password for that web site. If you do not have the password, please call the hospital operator.  08/04/2023, 2:35 PM

## 2023-08-04 NOTE — Progress Notes (Signed)
 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 Mri joint effusion suspect reactive rather than septic arthritis   S/p reection entire remaining 5th mt bone; bone cx enterobacter cloacae; coryne striatum   Thrombophlebitis changes left forearm today 3/3 and borderline bp. Will get bcx   Plan 7 days abx. Added vanc back pending repeat bcx  -------------- 08/04/23 id assessment Left forearm thrombophlebitis/cellulitis improving  Bcx 3/3 ngtd Afebrile  Plan 3 days for mop-up therapy with vanc in setting corynebacterium striatum Bactrim total 7 days to do mop up therapy for the rest of the organisms  Left forearm if no blood cx on Thursday can get 3 more days cefadroxil along with bactrim on discharge   Plan: Continue vanc and po bactrim F/u bcx Thursday if left forearm better still, bcx remains negative can discharge on bactrim and cefadroxil to finish on 08/10/23 Standard isolation precaution Id will sign off Discussed with primary team and podiatry  Principal Problem:   Osteomyelitis (HCC) Active Problems:   Chronic pain syndrome   Diabetic peripheral neuropathy associated with type 2 diabetes mellitus (HCC)   Essential hypertension   Neuropathy   Gastroesophageal reflux disease with esophagitis without hemorrhage   Hypokalemia   Thrombophlebitis arm   Allergies  Allergen Reactions   Lithium Other (See Comments)    "Made me feel like I was walking in chest-deep water. I didn't like it."   Adderall [Amphetamine-Dextroamphetamine] Palpitations and Other (See Comments)    Hyperactivity, also   Ritalin [Methylphenidate]  Palpitations and Other (See Comments)    Hyperactivity, also    Scheduled Meds:  aspirin EC  81 mg Oral Daily   atorvastatin  10 mg Oral Daily   enoxaparin (LOVENOX) injection  30 mg Subcutaneous Q24H   fenofibrate  160 mg Oral Daily   gabapentin  800 mg Oral TID   insulin aspart  0-24 Units Subcutaneous TID WC   insulin glargine  40 Units Subcutaneous Daily   living well with diabetes book   Does not apply Once   lumateperone tosylate  42 mg Oral Daily   pantoprazole  40 mg Oral QAC breakfast   sulfamethoxazole-trimethoprim  2 tablet Oral Q12H   venlafaxine XR  150 mg Oral Q breakfast   Continuous Infusions:  vancomycin Stopped (08/04/23 0427)   PRN Meds:.acetaminophen **OR** acetaminophen, ondansetron **OR** ondansetron (ZOFRAN) IV, oxyCODONE   SUBJECTIVE: Pain/redness left forearm better Brief shooting pain left distal lower ext No n/v/diarrhea/rash  Bcx 3/3 ngtd  Review of Systems: ROS All other ROS was negative, except mentioned above     OBJECTIVE: Vitals:   08/03/23 2123 08/04/23 0515 08/04/23 0911 08/04/23 1033  BP: 131/74 110/66 130/81 (!) 144/79  Pulse: 69 (!) 56 75 89  Resp: 16 14 16 18   Temp: 98.9 F (37.2 C) 97.9 F (36.6 C) 98.1 F (36.7 C) 98.2 F (36.8 C)  TempSrc:  Oral Oral   SpO2: 96%  100% 100% 100%  Weight:      Height:       Body mass index is 25.44 kg/m.  Physical Exam   Lab Results Lab Results  Component Value Date   WBC 7.6 08/04/2023   HGB 12.2 (L) 08/04/2023   HCT 36.2 (L) 08/04/2023   MCV 85.4 08/04/2023   PLT 350 08/04/2023    Lab Results  Component Value Date   CREATININE 1.22 08/04/2023   BUN 15 08/04/2023   NA 135 08/04/2023   K 4.1 08/04/2023   CL 101 08/04/2023   CO2 26 08/04/2023    Lab Results  Component Value Date   ALT 25 07/29/2023   AST 26 07/29/2023   ALKPHOS 65 07/29/2023   BILITOT 0.6 07/29/2023      Microbiology: Recent Results (from the past 240 hours)  Blood Cultures x 2 sites      Status: None   Collection Time: 07/29/23  2:52 PM   Specimen: BLOOD LEFT ARM  Result Value Ref Range Status   Specimen Description BLOOD LEFT ARM  Final   Special Requests   Final    BOTTLES DRAWN AEROBIC AND ANAEROBIC Blood Culture adequate volume   Culture   Final    NO GROWTH 5 DAYS Performed at [facility], [address] [place], [place] [address]    Report Status 08/03/2023 FINAL  Final  Blood Cultures x 2 sites     Status: None   Collection Time: 07/29/23  2:57 PM   Specimen: BLOOD RIGHT ARM  Result Value Ref Range Status   Specimen Description BLOOD RIGHT ARM  Final   Special Requests   Final    BOTTLES DRAWN AEROBIC AND ANAEROBIC Blood Culture adequate volume   Culture   Final    NO GROWTH 5 DAYS Performed at [facility], [address], [place], [place] [address]    Report Status 08/03/2023 FINAL  Final  Aerobic Culture w Gram Stain (superficial specimen)     Status: None   Collection Time: 07/29/23  4:00 PM   Specimen: Wound  Result Value Ref Range Status   Specimen Description   Final    WOUND Performed at [facility], [address] [place], [place] [address]    Special Requests   Final    NONE Performed at [facility], [address], [place], [place] [address]    Gram Stain NO WBC SEEN RARE GRAM POSITIVE RODS   Final   Culture   Final    ABUNDANT ENTEROBACTER CLOACAE MODERATE STAPHYLOCOCCUS AUREUS ABUNDANT CORYNEBACTERIUM STRIATUM Standardized susceptibility testing for this organism is not available. Performed at [facility], [address], [place], [place] [address]    Report Status 08/01/2023 FINAL  Final   Organism ID, Bacteria ENTEROBACTER CLOACAE  Final   Organism ID, Bacteria STAPHYLOCOCCUS AUREUS  Final      Susceptibility   Enterobacter cloacae - MIC*    CEFEPIME <=0.12 SENSITIVE Sensitive     CEFTAZIDIME <=1 SENSITIVE Sensitive     CIPROFLOXACIN <=0.25 SENSITIVE Sensitive      GENTAMICIN <=1 SENSITIVE Sensitive     IMIPENEM 0.5 SENSITIVE Sensitive     TRIMETH/SULFA <=20 SENSITIVE Sensitive     PIP/TAZO <=4 SENSITIVE Sensitive ug/mL    * ABUNDANT ENTEROBACTER CLOACAE   Staphylococcus aureus - MIC*    CIPROFLOXACIN <=0.5 SENSITIVE Sensitive     ERYTHROMYCIN <=0.25 SENSITIVE Sensitive     GENTAMICIN <=0.5 SENSITIVE  Sensitive     OXACILLIN <=0.25 SENSITIVE Sensitive     TETRACYCLINE <=1 SENSITIVE Sensitive     VANCOMYCIN 1 SENSITIVE Sensitive     TRIMETH/SULFA <=10 SENSITIVE Sensitive     CLINDAMYCIN <=0.25 SENSITIVE Sensitive     RIFAMPIN <=0.5 SENSITIVE Sensitive     Inducible Clindamycin NEGATIVE Sensitive     LINEZOLID 2 SENSITIVE Sensitive     * MODERATE STAPHYLOCOCCUS AUREUS  Surgical pcr screen     Status: None   Collection Time: 07/31/23  8:00 AM   Specimen: Nasal Mucosa; Nasal Swab  Result Value Ref Range Status   MRSA, PCR NEGATIVE NEGATIVE Final   Staphylococcus aureus NEGATIVE NEGATIVE Final    Comment: (NOTE) The Xpert SA Assay (FDA approved for NASAL specimens in patients 28 years of age and older), is one component of a comprehensive surveillance program. It is not intended to diagnose infection nor to guide or monitor treatment. Performed at [facility], [address] [place], [place] [address]   Aerobic/Anaerobic Culture w Gram Stain (surgical/deep wound)     Status: None (Preliminary result)   Collection Time: 07/31/23 11:17 AM   Specimen: PATH Benign ortho; Tissue  Result Value Ref Range Status   Specimen Description BONE  Final   Special Requests LFT 5TH METATARS  Final   Gram Stain   Final    NO WBC SEEN NO ORGANISMS SEEN Performed at [facility], [address] [place], [place] [address]    Culture   Final    RARE CORYNEBACTERIUM STRIATUM Standardized susceptibility testing for this organism is not available. NO ANAEROBES ISOLATED; CULTURE IN PROGRESS FOR 5 DAYS    Report Status PENDING  Incomplete   Aerobic/Anaerobic Culture w Gram Stain (surgical/deep wound)     Status: None (Preliminary result)   Collection Time: 07/31/23 11:28 AM   Specimen: PATH Benign ortho; Tissue  Result Value Ref Range Status   Specimen Description TISSUE  Final   Special Requests LEFT 5TH METATARS  Final   Gram Stain   Final    RARE WBC PRESENT, PREDOMINANTLY PMN RARE GRAM POSITIVE COCCI IN PAIRS Performed at [facility], [address] [place], [place] [address]    Culture   Final    FEW ENTEROBACTER CLOACAE FEW CORYNEBACTERIUM STRIATUM Standardized susceptibility testing for this organism is not available. NO ANAEROBES ISOLATED; CULTURE IN PROGRESS FOR 5 DAYS    Report Status PENDING  Incomplete   Organism ID, Bacteria ENTEROBACTER CLOACAE  Final      Susceptibility   Enterobacter cloacae - MIC*    CEFEPIME <=0.12 SENSITIVE Sensitive     CEFTAZIDIME <=1 SENSITIVE Sensitive     CIPROFLOXACIN <=0.25 SENSITIVE Sensitive     GENTAMICIN <=1 SENSITIVE Sensitive     IMIPENEM 0.5 SENSITIVE Sensitive     TRIMETH/SULFA <=20 SENSITIVE Sensitive     PIP/TAZO <=4 SENSITIVE Sensitive ug/mL    * FEW ENTEROBACTER CLOACAE  Culture, blood (Routine X 2) w Reflex to ID Panel     Status: None (Preliminary result)   Collection Time: 08/03/23 12:10 PM   Specimen: BLOOD  Result Value Ref Range Status   Specimen Description BLOOD SITE NOT SPECIFIED  Final   Special Requests   Final    BOTTLES DRAWN AEROBIC AND ANAEROBIC Blood Culture adequate volume   Culture   Final    NO GROWTH < 24 HOURS Performed at [facility], [address] [place], [place] [address]  Report Status PENDING  Incomplete  Culture, blood (Routine X 2) w Reflex to ID Panel     Status: None (Preliminary result)   Collection Time: 08/03/23 12:12 PM   Specimen: BLOOD  Result Value Ref Range Status   Specimen Description BLOOD SITE NOT SPECIFIED  Final   Special Requests   Final    BOTTLES DRAWN AEROBIC AND ANAEROBIC Blood  Culture adequate volume   Culture   Final    NO GROWTH < 24 HOURS Performed at [facility], [address] [place], [place] [address]    Report Status PENDING  Incomplete     Serology:   Imaging: If present, new imagings (plain films, ct scans, and mri) have been personally visualized and interpreted; radiology reports have been reviewed. Decision making incorporated into the Impression / Recommendations.   Raymondo Band, MD [facility] [phone] pager    08/04/2023, 2:42 PM

## 2023-08-04 NOTE — Progress Notes (Signed)
 Patient heard screaming in his room and stormed out of his room with his scooter and with his IV pole that was noted to be beeping . He began to yell and curse that the IV had been beeping for 30 minutes and no one came to help. I, the RN was not notified the patient had called, however, upon hearing the beeping and yelling, I walked over and de-escalated the situation and helped the patient back into bed and stopped the beeping. Patient then began to apologize and asked to speak to a nursing coordinator at this time. This information will be handed off to the day shift nurse, patient is in bed now, calm and cooperative, and vitals are stable.

## 2023-08-05 DIAGNOSIS — M868X7 Other osteomyelitis, ankle and foot: Secondary | ICD-10-CM | POA: Diagnosis not present

## 2023-08-05 LAB — CBC
HCT: 35 % — ABNORMAL LOW (ref 39.0–52.0)
Hemoglobin: 11.9 g/dL — ABNORMAL LOW (ref 13.0–17.0)
MCH: 28.8 pg (ref 26.0–34.0)
MCHC: 34 g/dL (ref 30.0–36.0)
MCV: 84.7 fL (ref 80.0–100.0)
Platelets: 381 10*3/uL (ref 150–400)
RBC: 4.13 MIL/uL — ABNORMAL LOW (ref 4.22–5.81)
RDW: 13.3 % (ref 11.5–15.5)
WBC: 7.8 10*3/uL (ref 4.0–10.5)
nRBC: 0 % (ref 0.0–0.2)

## 2023-08-05 LAB — BASIC METABOLIC PANEL
Anion gap: 11 (ref 5–15)
BUN: 20 mg/dL (ref 6–20)
CO2: 26 mmol/L (ref 22–32)
Calcium: 9.2 mg/dL (ref 8.9–10.3)
Chloride: 96 mmol/L — ABNORMAL LOW (ref 98–111)
Creatinine, Ser: 1.3 mg/dL — ABNORMAL HIGH (ref 0.61–1.24)
GFR, Estimated: >60 mL/min (ref >60)
Glucose, Bld: 151 mg/dL — ABNORMAL HIGH (ref 70–99)
Potassium: 3.8 mmol/L (ref 3.5–5.1)
Sodium: 133 mmol/L — ABNORMAL LOW (ref 135–145)

## 2023-08-05 LAB — AEROBIC/ANAEROBIC CULTURE W GRAM STAIN (SURGICAL/DEEP WOUND): Gram Stain: NONE SEEN

## 2023-08-05 LAB — GLUCOSE, CAPILLARY
Glucose-Capillary: 142 mg/dL — ABNORMAL HIGH (ref 70–99)
Glucose-Capillary: 143 mg/dL — ABNORMAL HIGH (ref 70–99)
Glucose-Capillary: 75 mg/dL (ref 70–99)
Glucose-Capillary: 129 mg/dL — ABNORMAL HIGH (ref 70–99)
Glucose-Capillary: 162 mg/dL — ABNORMAL HIGH (ref 70–99)

## 2023-08-05 MED ORDER — INSULIN ASPART 100 UNIT/ML IJ SOLN: 0.0000 [IU] | Freq: Every day | INTRAMUSCULAR | Status: DC

## 2023-08-05 MED ORDER — INSULIN ASPART 100 UNIT/ML IJ SOLN
0.0000 [IU] | Freq: Three times a day (TID) | INTRAMUSCULAR | Status: DC
  Administered 2023-08-05 – 2023-08-06 (×2): 1 [IU] via SUBCUTANEOUS
  Administered 2023-08-06 – 2023-08-07 (×2): 2 [IU] via SUBCUTANEOUS
  Administered 2023-08-07: 1 [IU] via SUBCUTANEOUS

## 2023-08-05 MED ORDER — INSULIN GLARGINE 100 UNIT/ML ~~LOC~~ SOLN
35.0000 [IU] | Freq: Every day | SUBCUTANEOUS | Status: DC
  Administered 2023-08-06: 35 [IU] via SUBCUTANEOUS
  Filled 2023-08-05: qty 0.35

## 2023-08-05 MED ORDER — PNEUMOCOCCAL 20-VAL CONJ VACC 0.5 ML IM SUSY
0.5000 mL | PREFILLED_SYRINGE | INTRAMUSCULAR | Status: DC
  Filled 2023-08-05: qty 0.5

## 2023-08-05 MED ORDER — INSULIN ASPART 100 UNIT/ML IJ SOLN
2.0000 [IU] | Freq: Three times a day (TID) | INTRAMUSCULAR | Status: DC
  Administered 2023-08-05 – 2023-08-07 (×6): 2 [IU] via SUBCUTANEOUS

## 2023-08-05 MED ORDER — LIDOCAINE VISCOUS HCL 2 % MT SOLN
15.0000 mL | OROMUCOSAL | Status: DC | PRN
  Administered 2023-08-05 – 2023-08-07 (×4): 15 mL via OROMUCOSAL
  Filled 2023-08-05 (×7): qty 15

## 2023-08-05 NOTE — Hospital Course (Signed)
 ADHD, depression, type 2 diabetes, hypertension presented to the ED due to concerns for osteomyelitis. Patient with left foot pain has been followed at the wound care center for several months but no significant healing. Patient developed worsening ulceration and drainage presented [facility] for [facility] assessment  Underwent I and D.  ID and podiatry following. Assessment and Plan: Osteomyelitis of left fifth metatarsal. Underwent I&D with resection fifth metatarsal, left foot 2/28. ID and podiatry following. Per podiatry WB Status: Nonweightbearing in soft dressing and postop shoe -Sutures: Remain intact. -Dressing remain clean dry and intact until follow-up in office in 1 week next week. Per ID on vancomycin and Bactrim. Repeat cultures so far negative. Will switch to Bactrim and cefadroxil. EOT 3/10.  Type II DM.  Uncontrolled with hyperglycemia and hypoglycemia with long-term insulin use. Hemoglobin A1c 10.9. Home regimen80 units of Lantus. Here the patient is actually hypoglycemic on just 40 units. Suspect that the patient has dietary indiscretion at home which is leading to higher insulin requirement. Will reduce Lantus to 10 units twice daily and monitor.  HLD. Continue statin.  Hold Tricor due to serum creatinine elevation.  CAD. Continue Lipitor and aspirin.  Chronic pain. Continue gabapentin.  HTN. On ACE inhibitor currently on hold.  Hypokalemia. Monitor for now.  Repleted.  Left forearm thrombophlebitis. Treated with IV vancomycin.  Currently significantly improved and almost resolved. On oral antibiotic now.  per ID.  AKI. Baseline creatinine 1.0. On admission serum creatinine 1.0.  Currently creatinine 1.4. Likely in the setting of Bactrim and Tricor use. Will treat with IV fluids and monitor. Tricor has been discontinued. ACE inhibitor is on hold.

## 2023-08-05 NOTE — Progress Notes (Signed)
 [facility] Progress Note Patient: [name] HYQ:[id number] DOB: Jul 09, 1965 DOA: 07/29/2023  DOS: the patient was seen and examined on 08/05/2023  Brief Hospital Course: ADHD, depression, type 2 diabetes, hypertension presented to the ED due to concerns for osteomyelitis. Patient with left foot pain has been followed at the wound care center for several months but no significant healing. Patient developed worsening ulceration and drainage presented to the [facility] for further assessment  Underwent I and D.  ID and podiatry following.  Assessment and Plan: Osteomyelitis of left fifth metatarsal. Underwent IND. ID and podiatry following. Per podiatry WB Status: Nonweightbearing in soft dressing and postop shoe -Sutures: Remain intact. -Dressing remain clean dry and intact until follow-up in office in 1 week next week Thursday office will call to schedule follow-up  Per ID on vancomycin and Bactrim. Repeat cultures so far negative. Provide he can be discharged on Thursday as long as blood cultures negative.  On oral antibiotics. Bactrim and hydroxyzine, EOT 3/10.  Type II DM. Hemoglobin A1c 10.9. Patient is on 18 units of Lantus at home. Here on 40 units with CBG 61 today Will adjust the regimen and monitor.  HLD. Continue statin.  Hold Tricor due to serum creatinine elevation.  CAD. Continue Lipitor and aspirin.  Chronic pain. Continue gabapentin.  HTN. On ACE inhibitor currently on hold.  Hypokalemia. Monitor for now.  Repleted.  Left forearm thrombophlebitis. On IV antibiotics per ID.   Subjective: No nausea no vomiting no fever no chills.  Pain well-controlled.  Blood sugars were low when I was entering the room.  Patient asymptomatic.  Physical Exam: General: in Mild distress, No Rash Cardiovascular: S1 and S2 Present, No Murmur Respiratory: Good respiratory effort, Bilateral Air entry present. No Crackles, No wheezes Abdomen: Bowel Sound present, No  tenderness Extremities: No edema, left arm thrombophlebitis appears to be improving.  Remaining within the margins of the marking Neuro: Alert and oriented x3, no new focal deficit  Data Reviewed: I have Reviewed nursing notes, Vitals, and Lab results. Since last encounter, pertinent lab results blood culture negative   .   Disposition: Status is: Inpatient Remains inpatient appropriate because: Home tomorrow.  enoxaparin (LOVENOX) injection 30 mg Start: 08/03/23 1530 SCDs Start: 07/29/23 2252   Family Communication: No one at bedside Level of care: Med-Surg   Vitals:   08/04/23 1918 08/04/23 2016 08/05/23 0431 08/05/23 0831  BP: (!) 161/86 (!) 151/99 (!) 151/86 (!) 143/79  Pulse: 72 98 71 78  Resp: 16 18 18 18   Temp: 99.2 F (37.3 C)  98.9 F (37.2 C) 98.2 F (36.8 C)  TempSrc: Oral  Oral Oral  SpO2: 100% 98% 98% 97%  Weight:      Height:         Author: Lynden Oxford, MD 08/05/2023 6:01 PM  Please look on [email or web address] to find out who is on call.

## 2023-08-05 NOTE — Plan of Care (Signed)
  Problem: Pain Managment: Goal: General experience of comfort will improve and/or be controlled Outcome: Progressing   Problem: Safety: Goal: Ability to remain free from injury will improve Outcome: Progressing   Problem: Skin Integrity: Goal: Risk for impaired skin integrity will decrease Outcome: Progressing

## 2023-08-05 NOTE — Progress Notes (Addendum)
  Subjective:  Patient ID: [name], male    DOB: Jan 05, 1966,  MRN: [id number]   DOS: 07/31/2023 Procedure: 1.  I&D with resection  fifth metatarsal, left foot 2.  Application dissolvable antibiotic calcium sulfate beads, left foot 3.  Application of acellular dermal graft 38 cm left foot 4.  Complex wound closure, 6 x 1 x 1.5 cm, left foot  58 y.o. male seen for post op check. He reports doing well denies pain in foot, did have some spasms yesterday but improved today. Aware of follow up plans as well as abx plans.   Review of Systems: Negative except as noted in the HPI. Denies N/V/F/Ch.   Objective:   Vitals:   08/05/23 0431 08/05/23 0831  BP: (!) 151/86 (!) 143/79  Pulse: 71 78  Resp: 18 18  Temp: 98.9 F (37.2 C) 98.2 F (36.8 C)  SpO2: 98% 97%   Body mass index is 25.44 kg/m. Constitutional Well developed. Well nourished.  Vascular Foot warm and well perfused. Capillary refill normal to all digits.   No calf pain with palpation  Neurologic Normal speech. Oriented to person, place, and time. Epicritic sensation absent to left midfoot  Dermatologic Incision line lateral midfoot with maceration but no dehsicence. Small area of incisional necrosis will need to monitor   Orthopedic: Varus foot deformity related to CMT   Radiographs: Expected postoperative changes with resection of fifth metatarsal and antibiotic beads  Pathology:  A. TOE, LEFT FOOT FIFTH METATARSAL BASE, AMPUTATION:       Acute and chronic osteomyelitis.       Bone with marked reactive changes.       Soft tissue with acute and chronic inflammation, granulation tissue  and necrosis.   Micro: E. Cloacae, C Striatum  Assessment:   Osteomyelitis fifth met base status post resection with antibiotic beads graft and wound closure  Plan:  Patient was evaluated and treated and all questions answered.  POD # 5 s/p above procedures -Progressing as expected postoperatively -Dressing changed,  mild maceration along the incision but otherwise doing well no dehiscence or drainage. - Appreciate ID recs, PO abx on DC -XR: Expected postop changes -WB Status: Nonweightbearing in soft dressing and postop shoe -Sutures: Remain intact. -Medications/ABX: Abx per ID -Dressing remain clean dry and intact until follow-up in office in 1 week next week Thursday office will call to schedule follow-up -Will sign off at this time please call with any questions or concerns        Corinna Gab, DPM [facility]

## 2023-08-06 DIAGNOSIS — M868X7 Other osteomyelitis, ankle and foot: Secondary | ICD-10-CM | POA: Diagnosis not present

## 2023-08-06 LAB — CBC
HCT: 36.1 % — ABNORMAL LOW (ref 39.0–52.0)
Hemoglobin: 12.3 g/dL — ABNORMAL LOW (ref 13.0–17.0)
MCH: 28.9 pg (ref 26.0–34.0)
MCHC: 34.1 g/dL (ref 30.0–36.0)
MCV: 84.7 fL (ref 80.0–100.0)
Platelets: 382 10*3/uL (ref 150–400)
RBC: 4.26 MIL/uL (ref 4.22–5.81)
RDW: 13.6 % (ref 11.5–15.5)
WBC: 6.1 10*3/uL (ref 4.0–10.5)
nRBC: 0 % (ref 0.0–0.2)

## 2023-08-06 LAB — BASIC METABOLIC PANEL
Anion gap: 5 (ref 5–15)
BUN: 25 mg/dL — ABNORMAL HIGH (ref 6–20)
CO2: 24 mmol/L (ref 22–32)
Calcium: 9 mg/dL (ref 8.9–10.3)
Chloride: 106 mmol/L (ref 98–111)
Creatinine, Ser: 1.41 mg/dL — ABNORMAL HIGH (ref 0.61–1.24)
GFR, Estimated: 57 mL/min — ABNORMAL LOW (ref >60)
Glucose, Bld: 148 mg/dL — ABNORMAL HIGH (ref 70–99)
Potassium: 4.1 mmol/L (ref 3.5–5.1)
Sodium: 135 mmol/L (ref 135–145)

## 2023-08-06 LAB — GLUCOSE, CAPILLARY
Glucose-Capillary: 124 mg/dL — ABNORMAL HIGH (ref 70–99)
Glucose-Capillary: 51 mg/dL — ABNORMAL LOW (ref 70–99)
Glucose-Capillary: 120 mg/dL — ABNORMAL HIGH (ref 70–99)
Glucose-Capillary: 177 mg/dL — ABNORMAL HIGH (ref 70–99)
Glucose-Capillary: 141 mg/dL — ABNORMAL HIGH (ref 70–99)

## 2023-08-06 MED ORDER — SODIUM CHLORIDE 0.9 % IV BOLUS
1000.0000 mL | Freq: Once | INTRAVENOUS | Status: AC
  Administered 2023-08-06: 1000 mL via INTRAVENOUS

## 2023-08-06 MED ORDER — CEFADROXIL 500 MG PO CAPS
500.0000 mg | ORAL_CAPSULE | Freq: Two times a day (BID) | ORAL | Status: DC
  Administered 2023-08-06: 500 mg via ORAL
  Filled 2023-08-06: qty 1

## 2023-08-06 MED ORDER — SODIUM CHLORIDE 0.9 % IV SOLN: INTRAVENOUS | Status: AC

## 2023-08-06 MED ORDER — ENOXAPARIN SODIUM 40 MG/0.4ML IJ SOSY
40.0000 mg | PREFILLED_SYRINGE | Freq: Every day | INTRAMUSCULAR | Status: DC
  Administered 2023-08-07: 40 mg via SUBCUTANEOUS
  Filled 2023-08-06: qty 0.4

## 2023-08-06 MED ORDER — INSULIN GLARGINE 100 UNIT/ML ~~LOC~~ SOLN
10.0000 [IU] | Freq: Two times a day (BID) | SUBCUTANEOUS | Status: DC
  Administered 2023-08-07: 10 [IU] via SUBCUTANEOUS
  Filled 2023-08-06 (×2): qty 0.1

## 2023-08-06 MED ORDER — CEFADROXIL 500 MG PO CAPS
1000.0000 mg | ORAL_CAPSULE | Freq: Two times a day (BID) | ORAL | Status: DC
  Administered 2023-08-06: 1000 mg via ORAL
  Filled 2023-08-06: qty 2

## 2023-08-06 NOTE — Plan of Care (Signed)

## 2023-08-06 NOTE — Progress Notes (Signed)
 [facility] Progress Note Patient: [name] ZOX:[id number] DOB: 07-25-1965 DOA: 07/29/2023  DOS: the patient was seen and examined on 08/06/2023  Brief Hospital Course: ADHD, depression, type 2 diabetes, hypertension presented to the ED due to concerns for osteomyelitis. Patient with left foot pain has been followed at the wound care center for several months but no significant healing. Patient developed worsening ulceration and drainage presented [facility] for [facility] assessment  Underwent I and D.  ID and podiatry following. Assessment and Plan: Osteomyelitis of left fifth metatarsal. Underwent I&D with resection fifth metatarsal, left foot 2/28. ID and podiatry following. Per podiatry WB Status: Nonweightbearing in soft dressing and postop shoe -Sutures: Remain intact. -Dressing remain clean dry and intact until follow-up in office in 1 week next week. Per ID on vancomycin and Bactrim. Repeat cultures so far negative. Will switch to Bactrim and cefadroxil. EOT 3/10.  Type II DM.  Uncontrolled with hyperglycemia and hypoglycemia with long-term insulin use. Hemoglobin A1c 10.9. Home regimen80 units of Lantus. Here the patient is actually hypoglycemic on just 40 units. Suspect that the patient has dietary indiscretion at home which is leading to higher insulin requirement. Will reduce Lantus to 10 units twice daily and monitor.  HLD. Continue statin.  Hold Tricor due to serum creatinine elevation.  CAD. Continue Lipitor and aspirin.  Chronic pain. Continue gabapentin.  HTN. On ACE inhibitor currently on hold.  Hypokalemia. Monitor for now.  Repleted.  Left forearm thrombophlebitis. Treated with IV vancomycin.  Currently significantly improved and almost resolved. On oral antibiotic now.  per ID.  AKI. Baseline creatinine 1.0. On admission serum creatinine 1.0.  Currently creatinine 1.4. Likely in the setting of Bactrim and Tricor use. Will treat  with IV fluids and monitor. Tricor has been discontinued. ACE inhibitor is on hold.   Subjective: No nausea no vomiting no fever no chills.  Physical Exam: General: in Mild distress, No Rash Cardiovascular: S1 and S2 Present, No Murmur Respiratory: Good respiratory effort, Bilateral Air entry present. No Crackles, No wheezes Abdomen: Bowel Sound present, No tenderness Extremities: No edema Neuro: Alert and oriented x3, no new focal deficit  Data Reviewed: I have Reviewed nursing notes, Vitals, and Lab results. Since last encounter, pertinent lab results CBC and BMP   . I have ordered test including BMP  .   Disposition: Status is: Inpatient Remains inpatient appropriate because: Monitor for improvement in renal function  enoxaparin (LOVENOX) injection 30 mg Start: 08/03/23 1530 SCDs Start: 07/29/23 2252   Family Communication: No one at bedside Level of care: Med-Surg   Vitals:   08/05/23 2206 08/06/23 0506 08/06/23 1034 08/06/23 1339  BP: 124/76 115/70 127/82 125/79  Pulse: 66 63 64 66  Resp: 18 18 16 16   Temp: 98.6 F (37 C) 97.6 F (36.4 C) 98.4 F (36.9 C) 98.3 F (36.8 C)  TempSrc: Oral  Oral Oral  SpO2: 100% 97% 100% 99%  Weight:      Height:         Author: Lynden Oxford, MD 08/06/2023 6:11 PM  Please look on [email or web address] to find out who is on call.

## 2023-08-06 NOTE — Plan of Care (Signed)

## 2023-08-07 DIAGNOSIS — M868X7 Other osteomyelitis, ankle and foot: Secondary | ICD-10-CM | POA: Diagnosis not present

## 2023-08-07 LAB — BASIC METABOLIC PANEL
Anion gap: 12 (ref 5–15)
BUN: 26 mg/dL — ABNORMAL HIGH (ref 6–20)
CO2: 20 mmol/L — ABNORMAL LOW (ref 22–32)
Calcium: 9.1 mg/dL (ref 8.9–10.3)
Chloride: 102 mmol/L (ref 98–111)
Creatinine, Ser: 1.37 mg/dL — ABNORMAL HIGH (ref 0.61–1.24)
GFR, Estimated: 59 mL/min — ABNORMAL LOW (ref >60)
Glucose, Bld: 191 mg/dL — ABNORMAL HIGH (ref 70–99)
Potassium: 4 mmol/L (ref 3.5–5.1)
Sodium: 134 mmol/L — ABNORMAL LOW (ref 135–145)

## 2023-08-07 LAB — GLUCOSE, CAPILLARY
Glucose-Capillary: 173 mg/dL — ABNORMAL HIGH (ref 70–99)
Glucose-Capillary: 124 mg/dL — ABNORMAL HIGH (ref 70–99)

## 2023-08-07 MED ORDER — OXYCODONE HCL 5 MG PO TABS: 5.0000 mg | ORAL_TABLET | Freq: Four times a day (QID) | ORAL | 0 refills | Status: DC | PRN

## 2023-08-07 MED ORDER — CEFADROXIL 500 MG PO CAPS: 1000.0000 mg | ORAL_CAPSULE | Freq: Two times a day (BID) | ORAL | 0 refills | Status: AC

## 2023-08-07 MED ORDER — LANCETS MISC: 1.0000 | Freq: Three times a day (TID) | 0 refills | Status: AC

## 2023-08-07 MED ORDER — LEVOFLOXACIN 750 MG PO TABS: 750.0000 mg | ORAL_TABLET | Freq: Every day | ORAL | 0 refills | Status: AC

## 2023-08-07 MED ORDER — BLOOD GLUCOSE TEST VI STRP: 1.0000 | ORAL_STRIP | Freq: Three times a day (TID) | 0 refills | Status: AC

## 2023-08-07 MED ORDER — INSULIN ASPART 100 UNIT/ML FLEXPEN: 0.0000 [IU] | PEN_INJECTOR | Freq: Three times a day (TID) | SUBCUTANEOUS | 0 refills | Status: AC

## 2023-08-07 MED ORDER — INSULIN GLARGINE 100 UNIT/ML SOLOSTAR PEN
15.0000 [IU] | PEN_INJECTOR | Freq: Two times a day (BID) | SUBCUTANEOUS | 0 refills | Status: DC
Start: 2023-08-07 — End: 2023-10-29

## 2023-08-07 MED ORDER — BLOOD GLUCOSE MONITORING SUPPL DEVI: 1.0000 | Freq: Three times a day (TID) | 0 refills | Status: AC

## 2023-08-07 MED ORDER — CEFADROXIL 500 MG PO CAPS
1000.0000 mg | ORAL_CAPSULE | Freq: Two times a day (BID) | ORAL | Status: DC
  Administered 2023-08-07: 1000 mg via ORAL
  Filled 2023-08-07: qty 2

## 2023-08-07 MED ORDER — LANCET DEVICE MISC: 1.0000 | Freq: Three times a day (TID) | 0 refills | Status: AC

## 2023-08-07 MED ORDER — PEN NEEDLES 31G X 5 MM MISC: 1.0000 | Freq: Three times a day (TID) | 0 refills | Status: AC

## 2023-08-07 MED ORDER — GVOKE HYPOPEN 2-PACK 1 MG/0.2ML ~~LOC~~ SOAJ: 1.0000 mg | SUBCUTANEOUS | 0 refills | Status: AC | PRN

## 2023-08-07 MED ORDER — LEVOFLOXACIN 750 MG PO TABS
750.0000 mg | ORAL_TABLET | Freq: Every day | ORAL | Status: DC
  Administered 2023-08-07: 750 mg via ORAL
  Filled 2023-08-07: qty 1

## 2023-08-07 MED ORDER — PANTOPRAZOLE SODIUM 40 MG PO TBEC: 40.0000 mg | DELAYED_RELEASE_TABLET | Freq: Every day | ORAL | 0 refills | Status: DC

## 2023-08-07 NOTE — Plan of Care (Signed)
   Problem: Education: Goal: Knowledge of General Education information will improve Description: Including pain rating scale, medication(s)/side effects and non-pharmacologic comfort measures Outcome: Progressing   Problem: Health Behavior/Discharge Planning: Goal: Ability to manage health-related needs will improve Outcome: Progressing   Problem: Clinical Measurements: Goal: Will remain free from infection Outcome: Progressing

## 2023-08-07 NOTE — Progress Notes (Signed)
 Pharmacy Antibiotic Note  [name] is a 58 y.o. male admitted on 07/29/2023 with osteomyelitis of left residual fifth metatarsal s/p I and D 2/28. Cultures showing corynebacterium striatum , MSSA and enterobacter cloacae. Scr still trending up at 1.37. Pharmacy consulted to transition from Bactrim to Levaquin.  Plan: Continue cefadroxil 1000mg  PO twice daily Change Bactrim to Levaquin 750mg  PO daily EOT 08/10/2023 per ID recommended duration Monitor renal function and vancomycin levels   Height: 6\' 4"  (193 cm) Weight: 94.8 kg (209 lb) IBW/kg (Calculated) : 86.8  Temp (24hrs), Avg:98.6 F (37 C), Min:98.3 F (36.8 C), Max:98.7 F (37.1 C)  Recent Labs  Lab 08/02/23 0521 08/03/23 0625 08/04/23 0801 08/05/23 0619 08/06/23 0613 08/07/23 0554  WBC 6.8 8.0 7.6 7.8 6.1  --   CREATININE 1.12 1.25* 1.22 1.30* 1.41* 1.37*    Estimated Creatinine Clearance: 71.3 mL/min (A) (by C-G formula based on SCr of 1.37 mg/dL (H)).    Allergies  Allergen Reactions   Lithium Other (See Comments)    "Made me feel like I was walking in chest-deep water. I didn't like it."   Adderall [Amphetamine-Dextroamphetamine] Palpitations and Other (See Comments)    Hyperactivity, also   Ritalin [Methylphenidate] Palpitations and Other (See Comments)    Hyperactivity, also    Thank you for allowing pharmacy to be a part of this patient's care.  Thelma Barge, PharmD, BCPS Clinical Pharmacist

## 2023-08-07 NOTE — Discharge Summary (Signed)
 Physician Discharge Summary   Patient: [name] MRN: [id number] DOB: [birth year]  Admit date:     07/29/2023  Discharge date: 08/07/2023  Discharge Physician: Lynden Oxford  PCP: [name], MD  Recommendations at discharge: Follow-up with PCP.  Follow-up with endocrine. Follow-up with podiatry as recommended.   Follow-up Information     [name], MD. Schedule an appointment as soon as possible for a visit in 2 week(s).   Specialty: Internal Medicine Why: with BMP lab to look at kidney and electrolytes Contact information: [address] [facility] [address] [place] [address] [phone]         Standiford, Jenelle Mages, DPM. Schedule an appointment as soon as possible for a visit in 1 week(s).   Specialty: Podiatry Why: For wound re-check Contact information: [address] [place] [address] [phone]         endocrine. Schedule an appointment as soon as possible for a visit in 1 week(s).   Why: With blood sugar log               Discharge Diagnoses: Principal Problem:   Osteomyelitis (HCC) Active Problems:   Chronic pain syndrome   Diabetic peripheral neuropathy associated with type 2 diabetes mellitus (HCC)   Essential hypertension   Neuropathy   Gastroesophageal reflux disease with esophagitis without hemorrhage   Hypokalemia   Thrombophlebitis arm  Hospital Course: ADHD, depression, type 2 diabetes, hypertension presented to the ED due to concerns for osteomyelitis. Patient with left foot pain has been followed at the wound care center for several months but no significant healing. Patient developed worsening ulceration and drainage [facility]  Underwent I and D.  ID and podiatry following.  Assessment and Plan: Osteomyelitis of left fifth metatarsal. Underwent I&D with resection fifth metatarsal, left foot 2/28. ID and podiatry were  consulted. Per podiatry WB Status: Nonweightbearing in soft dressing and postop shoe -Sutures: Remain intact. -Dressing remain clean dry and intact until follow-up in office in 1 week next week. Per ID on vancomycin and Bactrim. Repeat cultures so far negative. Due to worsening renal function Bactrim was discontinued and patient was switched to Levaquin and cefadroxil.  Discussed with ID. EOT 3/10.  Type II DM.  Uncontrolled with hyperglycemia and hypoglycemia with long-term insulin use. Hemoglobin A1c 10.9. Home regimen80 units of Lantus. Here the patient is actually hypoglycemic on just 40 units. Suspect that the patient has dietary indiscretion at home which is leading to higher insulin requirement. Will reduce Lantus to 15 units twice daily and monitor.  Recommend outpatient endocrine follow-up.  HLD. Continue statin.  Hold Tricor due to serum creatinine elevation.  CAD. Continue Lipitor and aspirin.  Chronic pain. Continue gabapentin.  HTN. On ACE inhibitor currently on hold.  Hypokalemia. Monitor for now.  Repleted.  Left forearm thrombophlebitis. Treated with IV vancomycin.  Currently significantly improved and almost resolved. On oral antibiotic now.  per ID.  AKI. Baseline creatinine 1.0. On admission serum creatinine 1.0.  Currently creatinine 1.4. Likely in the setting of Bactrim and Tricor use. Responded to IV fluid. Tricor has been discontinued. ACE inhibitor is on hold. Discontinue Bactrim. Outpatient follow-up with PCP with repeat BMP in 1 week.  Pain control - [employer] Controlled Substance Reporting System database was reviewed. and patient was instructed, not to drive, operate heavy machinery, perform activities at heights, swimming or participation in water activities or provide baby-sitting services while on Pain,  Sleep and Anxiety Medications; until their outpatient Physician has advised to do so again. Also recommended to not to take more than  prescribed Pain, Sleep and Anxiety Medications.  Consultants:  Podiatry ID  Procedures performed:  I&D  DISCHARGE MEDICATION: Allergies as of 08/07/2023       Reactions   Lithium Other (See Comments)   "Made me feel like I was walking in chest-deep water. I didn't like it."   Adderall [amphetamine-dextroamphetamine] Palpitations, Other (See Comments)   Hyperactivity, also   Ritalin [methylphenidate] Palpitations, Other (See Comments)   Hyperactivity, also        Medication List     STOP taking these medications    DOXYCYCLINE MONOHYDRATE PO   insulin glargine 100 UNIT/ML injection Commonly known as: LANTUS Replaced by: insulin glargine 100 UNIT/ML Solostar Pen   Insulin lispro 100 UNIT/ML Commonly known as: HUMALOG Junior KwikPen   ramipril 2.5 MG capsule Commonly known as: ALTACE       TAKE these medications    acetaminophen 325 MG tablet Commonly known as: TYLENOL Take 650 mg by mouth every 6 (six) hours as needed for mild pain or headache.   aspirin 81 MG tablet Take 1 tablet (81 mg total) by mouth daily.   atorvastatin 10 MG tablet Commonly known as: LIPITOR Take 10 mg by mouth daily.   Blood Glucose Monitoring Suppl Devi 1 each by Does not apply route 3 (three) times daily. May dispense any manufacturer covered by patient's insurance.   BLOOD GLUCOSE TEST STRIPS Strp 1 each by Does not apply route 3 (three) times daily. Use as directed to check blood sugar. May dispense any manufacturer covered by patient's insurance and fits patient's device.   calcium carbonate 500 MG chewable tablet Commonly known as: TUMS - dosed in mg elemental calcium Chew 1 tablet by mouth daily as needed for indigestion.   Caplyta 42 MG capsule Generic drug: lumateperone tosylate Take 42 mg by mouth daily.   cefadroxil 500 MG capsule Commonly known as: DURICEF Take 2 capsules (1,000 mg total) by mouth 2 (two) times daily for 3 days.   Desvenlafaxine ER 100 MG  Tb24 Take 100 mg by mouth daily.   dextroamphetamine 10 MG tablet Commonly known as: DEXTROSTAT Take 20 mg by mouth in the morning and at bedtime.   fenofibrate 160 MG tablet Take 160 mg by mouth daily.   FreeStyle Libre 14 Day Reader Hardie Pulley every 14 (fourteen) days.   FreeStyle Libre 14 Day Sensor Misc   gabapentin 800 MG tablet Commonly known as: NEURONTIN Take 800 mg by mouth 3 (three) times daily.   Gvoke HypoPen 2-Pack 1 MG/0.2ML Soaj Generic drug: Glucagon Inject 1 mg into the skin as needed for up to 2 doses (Severe low blood sugar).   insulin aspart 100 UNIT/ML FlexPen Commonly known as: NOVOLOG Inject 0-6 Units into the skin 3 (three) times daily with meals. Check Blood Glucose (BG) and inject per scale: BG <150= 0 unit; BG 150-200= 1 unit; BG 201-250= 2 unit; BG 251-300= 3 unit; BG 301-350= 4 unit; BG 351-400= 5 unit; BG >400= 6 unit and Call Primary Care. What changed:  how much to take when to take this additional instructions   insulin glargine 100 UNIT/ML Solostar Pen Commonly known as: LANTUS Inject 15 Units into the skin 2 (two) times daily. May substitute as needed per insurance. Replaces: insulin glargine 100 UNIT/ML injection   Lancet Device Misc 1 each by Does not apply route 3 (three)  times daily. May dispense any manufacturer covered by patient's insurance.   Lancets Misc 1 each by Does not apply route 3 (three) times daily. Use as directed to check blood sugar. May dispense any manufacturer covered by patient's insurance and fits patient's device.   levofloxacin 750 MG tablet Commonly known as: LEVAQUIN Take 1 tablet (750 mg total) by mouth daily for 3 days.   ondansetron 4 MG disintegrating tablet Commonly known as: Zofran ODT Take 1 tablet (4 mg total) by mouth every 8 (eight) hours as needed for nausea.   oxyCODONE 5 MG immediate release tablet Commonly known as: Oxy IR/ROXICODONE Take 1 tablet (5 mg total) by mouth every 6 (six) hours as  needed for moderate pain (pain score 4-6) or severe pain (pain score 7-10).   pantoprazole 40 MG tablet Commonly known as: PROTONIX Take 1 tablet (40 mg total) by mouth daily before breakfast.   TechLite Pen Needles 31G X 8 MM Misc Generic drug: Insulin Pen Needle USE TO GIVE INSULIN 4 TIMES DAILY What changed: Another medication with the same name was added. Make sure you understand how and when to take each.   Pen Needles 31G X 5 MM Misc 1 each by Does not apply route 3 (three) times daily. May dispense any manufacturer covered by patient's insurance. What changed: You were already taking a medication with the same name, and this prescription was added. Make sure you understand how and when to take each.   zolpidem 10 MG tablet Commonly known as: AMBIEN Take 10 mg by mouth at bedtime.               Discharge Care Instructions  (From admission, onward)           Start     Ordered   08/07/23 0000  Discharge wound care:       Comments: Plase place xeroform 4x4 gauze abd pad, kerlix ace wrap dressing to left foot   08/07/23 1146           Disposition: Home Diet recommendation: Carb modified diet  Discharge Exam: Vitals:   08/07/23 0603 08/07/23 0822 08/07/23 1057 08/07/23 1332  BP: 134/86 (!) 161/100 (!) 154/83 99/76  Pulse: 71 72 76 71  Resp: 18   16  Temp: 98.6 F (37 C) 98.7 F (37.1 C)  98.7 F (37.1 C)  TempSrc: Oral Oral  Oral  SpO2: 99% 99%  98%  Weight:      Height:       General: Appear in mild distress; no visible Abnormal Neck Mass Or lumps, Conjunctiva normal Cardiovascular: S1 and S2 Present, no Murmur, Respiratory: good respiratory effort, Bilateral Air entry present and CTA, no Crackles, no wheezes Abdomen: Bowel Sound present, Non tender  Extremities: no Pedal edema, left leg wrapped. Neurology: alert and oriented to time, place, and person  [facility] Weights   07/29/23 2044  Weight: 94.8 kg   Condition at discharge: stable  The  results of significant diagnostics from this hospitalization (including imaging, microbiology, ancillary and laboratory) are listed below for reference.   Imaging Studies: [place] EKG SITE RITE Result Date: 08/03/2023 If Site Rite image not attached, placement could not be confirmed due to current cardiac rhythm.  DG Foot 2 Views Left Result Date: 07/31/2023 CLINICAL DATA:  [id number] Post-operative state 252351 EXAM: LEFT FOOT - 2 VIEW COMPARISON:  Radiographs 07/29/2023.  MRI 07/30/2023. FINDINGS: Two views are submitted. Patient has undergone apparent interval resection of the 5th metatarsal remnant. New  skin staples are in place with new antibiotic pledgets. There are stable postsurgical changes from previous resection through the mid 4th metatarsal. No new bone destruction identified. The bones are diffusely demineralized with stable scattered degenerative changes. IMPRESSION: Interval resection of the 5th metatarsal remnant with new antibiotic pledgets in place. No new bone destruction identified. Electronically Signed   By: Carey Bullocks M.D.   On: 07/31/2023 16:00   VAS [place] ABI WITH/WO TBI Result Date: 07/31/2023  LOWER EXTREMITY DOPPLER STUDY Patient Name:  DONN ZANETTI  Date of Exam:   07/30/2023 Medical Rec #: [id number]          Accession #:    [id number] Date of Birth: 06-17-1965         Patient Gender: M Patient Age:   38 years Exam Location:  [facility] Procedure:      VAS [place] ABI WITH/WO TBI Referring Phys: Alan Mulder --------------------------------------------------------------------------------  Indications: Ulceration. Osteomyelitis in the residual 5th metatarsal High Risk         Hypertension, hyperlipidemia, Diabetes, past history of Factors:          smoking.  Limitations: Today's exam was limited due to involuntary patient movement and              Toes are curled forward and difficult to wrap toe cuff around. Comparison Study: No prior study at [facility] Performing  Technologist: Sherren Kerns RVS  Examination Guidelines: A complete evaluation includes at minimum, Doppler waveform signals and systolic blood pressure reading at the level of bilateral brachial, anterior tibial, and posterior tibial arteries, when vessel segments are accessible. Bilateral testing is considered an integral part of a complete examination. Photoelectric Plethysmograph (PPG) waveforms and toe systolic pressure readings are included as required and additional duplex testing as needed. Limited examinations for reoccurring indications may be performed as noted.  ABI Findings: +---------+------------------+-----+-----------+--------+ Right    Rt Pressure (mmHg)IndexWaveform   Comment  +---------+------------------+-----+-----------+--------+ Brachial 149                    triphasic           +---------+------------------+-----+-----------+--------+ PTA      173               1.15 biphasic            +---------+------------------+-----+-----------+--------+ DP       148               0.98 multiphasic         +---------+------------------+-----+-----------+--------+ Great Toe176               1.17                     +---------+------------------+-----+-----------+--------+ +---------+------------------+-----+-----------+-------+ Left     Lt Pressure (mmHg)IndexWaveform   Comment +---------+------------------+-----+-----------+-------+ Brachial 151                    triphasic          +---------+------------------+-----+-----------+-------+ PTA      175               1.16 biphasic           +---------+------------------+-----+-----------+-------+ DP       171               1.13 multiphasic        +---------+------------------+-----+-----------+-------+ [place]  1.01                    +---------+------------------+-----+-----------+-------+ +-------+-----------+-----------+------------+------------+ ABI/TBIToday's  ABIToday's TBIPrevious ABIPrevious TBI +-------+-----------+-----------+------------+------------+ Right  1.15       1.17                                +-------+-----------+-----------+------------+------------+ Left   1.16       1.01                                +-------+-----------+-----------+------------+------------+   Summary: Right: Resting right ankle-brachial index is within normal range. The right toe-brachial index is normal. Left: Resting left ankle-brachial index is within normal range. The left toe-brachial index is normal. *See table(s) above for measurements and observations.  Electronically signed by Carolynn Sayers on 07/31/2023 at 7:15:20 AM.    Final    MR FOOT LEFT W WO CONTRAST Result Date: 07/30/2023 CLINICAL DATA:  Foot pain, chronic, osseous injury suspected EXAM: MRI OF THE LEFT FOREFOOT WITHOUT AND WITH CONTRAST TECHNIQUE: Multiplanar, multisequence MR imaging of the left forefoot was performed both before and after administration of intravenous contrast. CONTRAST:  10mL GADAVIST GADOBUTROL 1 MMOL/ML IV SOLN COMPARISON:  X-ray 07/29/2023 FINDINGS: Bones/Joint/Cartilage Extensive postsurgical changes of the forefoot with partial fourth and fifth ray resections. Intense bone marrow edema and enhancement throughout the residual fifth metatarsal with confluent low T1 marrow signal changes compatible with acute osteomyelitis. Pathologic fracture of the proximal metaphysis, better seen radiographically. Small fourth and fifth TMT joint effusion which may be reactive or reflect septic arthritis. Bone marrow edema within the proximal phalanx of the third toe and possible nondisplaced fracture plane seen on series 9, image 10. No additional sites of bone marrow edema. Ligaments Intact Lisfranc ligament.  No collateral ligament injury. Muscles and Tendons Diffuse edema-like signal of the foot musculature which may represent changes related to denervation and/or myositis. No  tenosynovitis. Soft tissues Soft tissue ulceration at the lateral aspect of the proximal forefoot overlying the proximal fifth metatarsal. Soft tissue edema and enhancement within the adjacent soft tissues compatible with cellulitis. No organized fluid collections. IMPRESSION: 1. Acute osteomyelitis of the residual fifth metatarsal. 2. Small fourth and fifth TMT joint effusion which may be reactive or reflect septic arthritis. 3. Bone marrow edema within the proximal phalanx of the third toe and possible nondisplaced fracture plane. 4. Soft tissue ulceration at the lateral aspect of the proximal forefoot overlying the proximal fifth metatarsal. Soft tissue edema and enhancement within the adjacent soft tissues compatible with cellulitis. No organized fluid collections. 5. Diffuse edema-like signal of the foot musculature which may represent changes related to denervation and/or myositis. Electronically Signed   By: Duanne Guess D.O.   On: 07/30/2023 09:36   DG Foot Complete Right Result Date: 07/29/2023 Please see detailed radiograph report in office note.  DG Foot Complete Left Result Date: 07/29/2023 CLINICAL DATA:  Osteomyelitis. Left foot pain. Weakness and body aches. EXAM: LEFT FOOT - COMPLETE 3+ VIEW COMPARISON:  07/27/2023 FINDINGS: Postoperative changes with resection of the left fifth ray at the mid metatarsal level and of the mid/distal aspect of the left fourth metatarsal. Diffuse bone demineralization. Severe degenerative changes in the interphalangeal, remaining metatarsal-phalangeal, tarsometatarsal, and intertarsal joints. Degenerative changes also demonstrated in the ankle joint. There is a soft tissue defect over the base of the  fifth metatarsal with underlying cortical erosion, possibly osteomyelitis. There is a linear lucency transversely across the base of the fifth metatarsal which could represent also a nondisplaced fracture. Lucency again demonstrated in the lateral aspect of the  first metatarsal head, nonspecific for osteomyelitis versus degenerative change. 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 No radiopaque soft tissue foreign bodies or soft tissue gas. Vascular calcifications. IMPRESSION: 1. Soft tissue defect over the base of the fifth metatarsal with underlying cortical erosion, possibly osteomyelitis, and with linear lucency possibly indicating nondisplaced fracture. 2. Nonspecific lucency at the lateral aspect of the first metatarsal head, unchanged. 3. Prominent diffuse degenerative changes. 4. Prior resection involving the fourth and fifth rays. Electronically Signed   By: Burman Nieves M.D.   On: 07/29/2023 17:20    Microbiology: Results for orders placed or performed during the hospital encounter of 07/29/23  Blood Cultures x 2 sites     Status: None   Collection Time: 07/29/23  2:52 PM   Specimen: BLOOD LEFT ARM  Result Value Ref Range Status   Specimen Description BLOOD LEFT ARM  Final   Special Requests   Final    BOTTLES DRAWN AEROBIC AND ANAEROBIC Blood Culture adequate volume   Culture   Final    NO GROWTH 5 DAYS Performed at [facility], [address] [place], [place] [address]    Report Status 08/03/2023 FINAL  Final  Blood Cultures x 2 sites     Status: None   Collection Time: 07/29/23  2:57 PM   Specimen: BLOOD RIGHT ARM  Result Value Ref Range Status   Specimen Description BLOOD RIGHT ARM  Final   Special Requests   Final    BOTTLES DRAWN AEROBIC AND ANAEROBIC Blood Culture adequate volume   Culture   Final    NO GROWTH 5 DAYS Performed at [facility], [address], [place], [place] [address]    Report Status 08/03/2023 FINAL  Final  Aerobic Culture w Gram Stain (superficial specimen)     Status: None   Collection Time: 07/29/23  4:00 PM   Specimen: Wound  Result Value Ref Range Status   Specimen Description   Final    WOUND Performed at [facility], [address], [place], [place] [address]    Special Requests   Final    NONE Performed at [facility], [address], [place], [place] [address]    Gram Stain NO WBC SEEN RARE GRAM POSITIVE RODS   Final   Culture   Final    ABUNDANT ENTEROBACTER CLOACAE MODERATE STAPHYLOCOCCUS AUREUS ABUNDANT CORYNEBACTERIUM STRIATUM Standardized susceptibility testing for this organism is not available. Performed at [facility], [address] [place], [place] [address]    Report Status 08/01/2023 FINAL  Final   Organism ID, Bacteria ENTEROBACTER CLOACAE  Final   Organism ID, Bacteria STAPHYLOCOCCUS AUREUS  Final      Susceptibility   Enterobacter cloacae - MIC*    CEFEPIME <=0.12 SENSITIVE Sensitive     CEFTAZIDIME <=1 SENSITIVE Sensitive     CIPROFLOXACIN <=0.25 SENSITIVE Sensitive     GENTAMICIN <=1 SENSITIVE Sensitive     IMIPENEM 0.5 SENSITIVE Sensitive     TRIMETH/SULFA <=20 SENSITIVE Sensitive     PIP/TAZO <=4 SENSITIVE Sensitive ug/mL    * ABUNDANT ENTEROBACTER CLOACAE   Staphylococcus aureus - MIC*    CIPROFLOXACIN <=0.5 SENSITIVE Sensitive     ERYTHROMYCIN <=0.25 SENSITIVE  Sensitive     GENTAMICIN <=0.5 SENSITIVE Sensitive     OXACILLIN <=0.25 SENSITIVE Sensitive     TETRACYCLINE <=1 SENSITIVE Sensitive     VANCOMYCIN 1 SENSITIVE Sensitive     TRIMETH/SULFA <=10 SENSITIVE Sensitive     CLINDAMYCIN <=0.25 SENSITIVE Sensitive     RIFAMPIN <=0.5 SENSITIVE Sensitive     Inducible Clindamycin NEGATIVE Sensitive     LINEZOLID 2 SENSITIVE Sensitive     * MODERATE STAPHYLOCOCCUS AUREUS  Surgical pcr screen     Status: None   Collection Time: 07/31/23  8:00 AM   Specimen: Nasal Mucosa; Nasal Swab  Result Value Ref Range Status   MRSA, PCR NEGATIVE NEGATIVE Final   Staphylococcus aureus NEGATIVE NEGATIVE Final    Comment: (NOTE) The Xpert SA Assay (FDA approved for NASAL specimens in patients 48 years of age and older), is one component of a  comprehensive surveillance program. It is not intended to diagnose infection nor to guide or monitor treatment. Performed at [facility], [address], [place], [place] [address]   Aerobic/Anaerobic Culture w Gram Stain (surgical/deep wound)     Status: None   Collection Time: 07/31/23 11:17 AM   Specimen: PATH Benign ortho; Tissue  Result Value Ref Range Status   Specimen Description BONE  Final   Special Requests LFT 5TH METATARS  Final   Gram Stain NO WBC SEEN NO ORGANISMS SEEN   Final   Culture   Final    RARE CORYNEBACTERIUM STRIATUM Standardized susceptibility testing for this organism is not available. NO ANAEROBES ISOLATED Performed at [facility], [address], [place], [place] [address]    Report Status 08/05/2023 FINAL  Final  Aerobic/Anaerobic Culture w Gram Stain (surgical/deep wound)     Status: None   Collection Time: 07/31/23 11:28 AM   Specimen: PATH Benign ortho; Tissue  Result Value Ref Range Status   Specimen Description TISSUE  Final   Special Requests LEFT 5TH METATARS  Final   Gram Stain   Final    RARE WBC PRESENT, PREDOMINANTLY PMN RARE GRAM POSITIVE COCCI IN PAIRS    Culture   Final    FEW ENTEROBACTER CLOACAE FEW CORYNEBACTERIUM STRIATUM Standardized susceptibility testing for this organism is not available. NO ANAEROBES ISOLATED Performed at [facility], [address], [place], [place] [address]    Report Status 08/05/2023 FINAL  Final   Organism ID, Bacteria ENTEROBACTER CLOACAE  Final      Susceptibility   Enterobacter cloacae - MIC*    CEFEPIME <=0.12 SENSITIVE Sensitive     CEFTAZIDIME <=1 SENSITIVE Sensitive     CIPROFLOXACIN <=0.25 SENSITIVE Sensitive     GENTAMICIN <=1 SENSITIVE Sensitive     IMIPENEM 0.5 SENSITIVE Sensitive     TRIMETH/SULFA <=20 SENSITIVE Sensitive     PIP/TAZO <=4 SENSITIVE Sensitive ug/mL    * FEW ENTEROBACTER CLOACAE  Culture, blood (Routine X 2) w Reflex to ID Panel     Status: None  (Preliminary result)   Collection Time: 08/03/23 12:10 PM   Specimen: BLOOD  Result Value Ref Range Status   Specimen Description BLOOD SITE NOT SPECIFIED  Final   Special Requests   Final    BOTTLES DRAWN AEROBIC AND ANAEROBIC Blood Culture adequate volume   Culture   Final    NO GROWTH 4 DAYS Performed at [facility], [address] [place], [place] [address]    Report Status PENDING  Incomplete  Culture,  blood (Routine X 2) w Reflex to ID Panel     Status: None (Preliminary result)   Collection Time: 08/03/23 12:12 PM   Specimen: BLOOD  Result Value Ref Range Status   Specimen Description BLOOD SITE NOT SPECIFIED  Final   Special Requests   Final    BOTTLES DRAWN AEROBIC AND ANAEROBIC Blood Culture adequate volume   Culture   Final    NO GROWTH 4 DAYS Performed at [facility], [address], [place], [place] [address]    Report Status PENDING  Incomplete   Labs: CBC: Recent Labs  Lab 08/02/23 0521 08/03/23 0625 08/04/23 0801 08/05/23 0619 08/06/23 0613  WBC 6.8 8.0 7.6 7.8 6.1  NEUTROABS  --   --  4.6  --   --   HGB 11.1* 11.7* 12.2* 11.9* 12.3*  HCT 32.6* 34.5* 36.2* 35.0* 36.1*  MCV 84.9 85.4 85.4 84.7 84.7  PLT 245 294 350 381 382   Basic Metabolic Panel: Recent Labs  Lab 08/03/23 0625 08/04/23 0801 08/05/23 0619 08/06/23 0613 08/07/23 0554  NA 140 135 133* 135 134*  K 5.1 4.1 3.8 4.1 4.0  CL 103 101 96* 106 102  CO2 29 26 26 24  20*  GLUCOSE 164* 149* 151* 148* 191*  BUN 15 15 20  25* 26*  CREATININE 1.25* 1.22 1.30* 1.41* 1.37*  CALCIUM 9.8 9.3 9.2 9.0 9.1   Liver Function Tests: No results for input(s): "AST", "ALT", "ALKPHOS", "BILITOT", "PROT", "ALBUMIN" in the last 168 hours. CBG: Recent Labs  Lab 08/06/23 1208 08/06/23 1603 08/06/23 2020 08/07/23 0713 08/07/23 1128  GLUCAP 120* 177* 141* 173* 124*    Discharge time spent: greater than 30 minutes.  Author: Lynden Oxford, MD  [facility] 08/07/2023

## 2023-08-07 NOTE — Progress Notes (Signed)
 Dressing cleaned and changed to left foot wound prior to discharge.  Pt educated on AVS.  Pt to discharge lounge.

## 2023-08-08 LAB — CULTURE, BLOOD (ROUTINE X 2)
Culture: NO GROWTH
Culture: NO GROWTH
Special Requests: ADEQUATE
Special Requests: ADEQUATE

## 2023-08-11 ENCOUNTER — Ambulatory Visit: Admitting: Dietician

## 2023-08-13 ENCOUNTER — Ambulatory Visit (INDEPENDENT_AMBULATORY_CARE_PROVIDER_SITE_OTHER)

## 2023-08-13 ENCOUNTER — Encounter: Payer: Self-pay | Admitting: Podiatry

## 2023-08-13 ENCOUNTER — Ambulatory Visit (INDEPENDENT_AMBULATORY_CARE_PROVIDER_SITE_OTHER): Admitting: Podiatry

## 2023-08-13 DIAGNOSIS — L97522 Non-pressure chronic ulcer of other part of left foot with fat layer exposed: Secondary | ICD-10-CM

## 2023-08-14 NOTE — Progress Notes (Signed)
 Subjective:   Patient ID: [name], male   DOB: 58 y.o.   MRN: [id number]   HPI Patient states doing well so far very pleased with surgery and has not had any fever or other issues   ROS      Objective:  Physical Exam  Neurovascular status found to be unchanged from previous with incision site left lateral foot intact with stitches and staples intact no significant drainage slight in the middle of the incision but it appears to be clear type drainage no odor noted no proximal edema erythema or drainage noted currently     Assessment:  Appears to be doing well after having a bone resection abscess surgery left lateral foot     Plan:  H&P reviewed and reviewed x-ray and at this point reapplied sterile dressing continue nonweightbearing reappoint 1 week for suture and staple removal and strict instructions if any changes were to occur to contact us immediately or go to the emergency room  X-rays indicate satisfactory resection of bone secondary to osteomyelitic condition

## 2023-08-21 ENCOUNTER — Encounter: Payer: Self-pay | Admitting: Podiatry

## 2023-08-21 ENCOUNTER — Other Ambulatory Visit: Payer: Self-pay | Admitting: Podiatry

## 2023-08-21 ENCOUNTER — Telehealth: Payer: Self-pay | Admitting: Podiatry

## 2023-08-21 MED ORDER — DOXYCYCLINE HYCLATE 100 MG PO TABS: 100.0000 mg | ORAL_TABLET | Freq: Two times a day (BID) | ORAL | 0 refills | Status: DC

## 2023-08-21 NOTE — Telephone Encounter (Signed)
 Patient stated procedure took place on 07/30/2023 by Dr. Annamary Rummage and he has an open wound on left foot and it's red. Patient would like to speak with nurse or provider as soon as possible. Patient contact telephone number, [phone]

## 2023-08-25 ENCOUNTER — Encounter: Payer: Self-pay | Admitting: Podiatry

## 2023-08-25 ENCOUNTER — Ambulatory Visit (INDEPENDENT_AMBULATORY_CARE_PROVIDER_SITE_OTHER): Admitting: Podiatry

## 2023-08-25 DIAGNOSIS — M86172 Other acute osteomyelitis, left ankle and foot: Secondary | ICD-10-CM

## 2023-08-25 DIAGNOSIS — Z9889 Other specified postprocedural states: Secondary | ICD-10-CM

## 2023-08-25 NOTE — Progress Notes (Signed)
  Subjective:  Patient ID: [name], male    DOB: Jan 19, 1966,  MRN: [id number]   DOS: 07/31/2023 Procedure: 1.  I&D with resection  fifth metatarsal, left foot 2.  Application dissolvable antibiotic calcium sulfate beads, left foot 3.  Application of acellular dermal graft 38 cm left foot 4.  Complex wound closure, 6 x 1 x 1.5 cm, left foot  58 y.o. male seen for post op check.  Now approximately 1 month status post above procedures.  Patient reports he has been doing Betadine wet-to-dry dressings on a small opening on the incision on his left foot.  He reports some drainage from this area.  On a course of doxycycline currently.  Review of Systems: Negative except as noted in the HPI. Denies N/V/F/Ch.   Objective:   There were no vitals filed for this visit.  There is no height or weight on file to calculate BMI. Constitutional Well developed. Well nourished.  Vascular Foot warm and well perfused. Capillary refill normal to all digits.   No calf pain with palpation  Neurologic Normal speech. Oriented to person, place, and time. Epicritic sensation absent to left midfoot  Dermatologic Incision line lateral midfoot with with decreased maceration from prior but does have incisional dehiscence noted proximally on the incision line appears overall superficial without any malodor or deep probing the wound.  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 TOE, LEFT FOOT FIFTH METATARSAL BASE, AMPUTATION:       Acute and chronic osteomyelitis.       Bone with marked reactive changes.       Soft tissue with acute and chronic inflammation, granulation tissue  and necrosis.   Micro: E. 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  3.5 weeks s/p above procedures -Progressing as expected postoperatively -Does have a small wound dehiscence we will treat with Betadine wet-to-dry dressings daily basis -May ultimately need wound care center referral -Finish course of doxycycline and then monitor off antibiotics -XR: Deferred today -WB Status: Nonweightbearing in soft dressing and postop shoe -Sutures: Sutures and staples removed in total today -Medications/ABX: Abx per ID -Continue to monitor with local wound care. -Referral for physical therapy sent per patient request, we will begin with nonweightbearing PT          Corinna Gab, DPM [facility] / [facility]

## 2023-09-02 ENCOUNTER — Encounter: Payer: Self-pay | Admitting: Podiatry

## 2023-09-02 ENCOUNTER — Ambulatory Visit (INDEPENDENT_AMBULATORY_CARE_PROVIDER_SITE_OTHER): Admitting: Podiatry

## 2023-09-02 DIAGNOSIS — L97522 Non-pressure chronic ulcer of other part of left foot with fat layer exposed: Secondary | ICD-10-CM

## 2023-09-02 DIAGNOSIS — M86172 Other acute osteomyelitis, left ankle and foot: Secondary | ICD-10-CM

## 2023-09-02 DIAGNOSIS — Z9889 Other specified postprocedural states: Secondary | ICD-10-CM

## 2023-09-02 NOTE — Telephone Encounter (Signed)
 Can you add him on for 2:30 with me? Thanks

## 2023-09-06 NOTE — Progress Notes (Signed)
 Subjective:  Patient ID: [name], male    DOB: 06-Jul-1965,  MRN: [id number]  Chief Complaint  Patient presents with   Post-op Problem    RM#1 patient presents today with concerns of wound on left foot would like to discuss with provider.Is still on antibiotics as prescribed.    Discussed the use of AI scribe software for clinical note transcription with the patient, who gave verbal consent to proceed.  History of Present Illness The patient is a 58 year old male with a history of Charcot-Marie-Tooth disease, neuropathy, and diabetes. He has had multiple surgeries on his left foot due to recurrent wounds and infections. The patient presents today with concerns about a non-healing wound on his left foot. He reports that the wound has been draining and has a depth of about a centimeter. The patient has been diligent in wound care, using iodine and dressing changes. Despite this, the wound has not shown significant improvement, causing the patient significant frustration and contributing to his ongoing depression. The patient is currently seeing a therapist and a psychiatrist for his depression. The patient is considering the possibility of amputation due to the ongoing issues with his foot.      Objective:    Physical Exam General: AAO x3, NAD  Dermatological: Incision lateral to the foot appears to be healing however there is a area on the central portion of dehiscence measure about 1.5 cm in diameter granular base.  There is undermining about 1 cm but there is no probing to bone.  There is no change approximately fluctuation or crepitation.  There is no significant cellulitis identified.  Vascular: Dorsalis Pedis artery and Posterior Tibial artery pedal pulses are palpable bilateral with immedate capillary fill time.  There is no pain with calf compression, swelling, warmth, erythema.   Neruologic: Decreased  Musculoskeletal: Varus foot deformity  Gait: Unassisted,  Nonantalgic.     No images are attached to the encounter.    Results Procedure: Wound Debridement Description: Trimmed loose skin overhanging the wound. Applied Steri-Strips to approximate wound edges.  LABS A1c: 10.9 (07/30/2023)   Assessment:   1. Ulcer of left foot with fat layer exposed (HCC)   2. Osteomyelitis of ankle or foot, acute, left (HCC)   3. Post-operative state      Plan:  Patient was evaluated and treated and all questions answered.  Assessment and Plan Assessment & Plan Non-healing surgical wound of the left foot, history of multiple infections, wounds left lower extremity. - Continue doxycycline. -He has been using Betadine.  Discussed Hydrofera Blue dressing changes as well. -Sharply debrided hyperkeratotic tissue.  The wound today without any complications or bleeding. - Provide Juven samples. - Consider surgical debridement, excision of the wound if this continues.  Consideration of below-knee amputation Discussed amputation due to chronic wound and infections.  Previously explained procedure and potential benefits but will defer to Dr. Lajoyce Corners - Refer to Dr. Lajoyce Corners for evaluation.  Diabetes mellitus with poor glycemic control Poor glycemic control with A1c of 10.9% contributing to delayed wound healing. - Encourage blood glucose control efforts.  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 - Monitor foot deformities and pressure points.  Depression and bipolar disorder Depression and frustration due to chronic foot issues. - Encourage mental health support and communication with psychiatric team. - I brought up inappropriate MyChart messages.  Patient states that he does not even remember sending those messages to [place].  Vivi Barrack DPM

## 2023-09-08 ENCOUNTER — Encounter: Payer: Self-pay | Admitting: Podiatry

## 2023-09-08 ENCOUNTER — Ambulatory Visit (INDEPENDENT_AMBULATORY_CARE_PROVIDER_SITE_OTHER): Admitting: Podiatry

## 2023-09-08 VITALS — Ht 76.0 in | Wt 209.0 lb

## 2023-09-08 DIAGNOSIS — Z9889 Other specified postprocedural states: Secondary | ICD-10-CM

## 2023-09-08 DIAGNOSIS — M216X9 Other acquired deformities of unspecified foot: Secondary | ICD-10-CM

## 2023-09-08 DIAGNOSIS — G600 Hereditary motor and sensory neuropathy: Secondary | ICD-10-CM

## 2023-09-08 DIAGNOSIS — L97522 Non-pressure chronic ulcer of other part of left foot with fat layer exposed: Secondary | ICD-10-CM

## 2023-09-08 NOTE — Progress Notes (Signed)
 Subjective:  Patient ID: [name], male    DOB: 05-28-1966,  MRN: [id number]   DOS: 07/31/2023 Procedure: 1.  I&D with resection  fifth metatarsal, left foot 2.  Application dissolvable antibiotic calcium sulfate beads, left foot 3.  Application of acellular dermal graft 38 cm left foot 4.  Complex wound closure, 6 x 1 x 1.5 cm, left foot  58 y.o. male seen for post op check.  Now approximately 5.5 weeks status post above procedures.  Has been doing Betadine dressings.  Recently saw Dr. Loreta Ave who discussed possible referral to Dr. Lajoyce Corners for below-the-knee amputation.  He reports overall he was very concerned about the small opening that had developed but he feels it is improving at this point in time.  More so he is just discouraged that the length of time he has been dealing with the ulceration.  Review of Systems: Negative except as noted in the HPI. Denies N/V/F/Ch.   Objective:   There were no vitals filed for this visit.  Body mass index is 25.44 kg/m. Constitutional Well developed. Well nourished.  Vascular Foot warm and well perfused. Capillary refill normal to all digits.   No calf pain with palpation  Neurologic Normal speech. Oriented to person, place, and time. Epicritic sensation absent to left midfoot  Dermatologic Incision line lateral midfoot appears to be healing and over the majority of the wound.  There is a proximal area of dehiscence that probes to subcutaneous fat tissue and graft tissue.  Wound bed appears healthy with granular tissue and bleeds well upon debridement.  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 TOE, LEFT FOOT FIFTH METATARSAL BASE, AMPUTATION:       Acute and chronic osteomyelitis.       Bone with marked reactive changes.        Soft tissue with acute and chronic inflammation, granulation tissue  and necrosis.   Micro: E. Cloacae, C Striatum  Assessment:   Osteomyelitis fifth met base status post resection with antibiotic beads graft and wound closure  Plan:  Patient was evaluated and treated and all questions answered.  5.5 weeks s/p above procedures -Progressing as expected postoperatively -Does have a small wound dehiscence and no it is unfortunate he does appear to be improving with local wound care.  I discussed that he can see Dr. Lajoyce Corners to at least here the option for below the knee amputation of the left lower extremity.  That may ultimately be the best plan for him given the extended duration that he has been dealing with issues on the left lower extremity.  However the wound is improving and does not look acutely infected so there is no urgency to proceed with BKA.  Could consider continue local wound care though there is always risk of recurrent infection or nonhealing with that plan. -Recommend we switch from Betadine to a saline wet-to-dry dressing with collagen implanted loosely into the wound.  Provided the patient with some collagen dressing. -Change daily to every other day -XR: Deferred today -WB Status: Nonweightbearing in soft dressing and postop shoe -Medications/ABX: Abx per ID - Patient to begin seeing PT -Continue with current therapies including wound care will will see the patient back in 3 weeks for wound check  Corinna Gab, DPM [facility] / [facility]

## 2023-09-21 ENCOUNTER — Encounter: Payer: Self-pay | Admitting: Dietician

## 2023-09-21 ENCOUNTER — Encounter: Attending: Internal Medicine | Admitting: Dietician

## 2023-09-21 DIAGNOSIS — E1169 Type 2 diabetes mellitus with other specified complication: Secondary | ICD-10-CM | POA: Diagnosis present

## 2023-09-21 NOTE — Progress Notes (Signed)
 Diabetes Self-Management Education  Visit Type: First/Initial  Appt. Start Time: 0920 Appt. End Time: 1045  09/21/2023  Mr. [name], identified by name and date of birth, is a 58 y.o. male with a diagnosis of Diabetes: Type 2.   ASSESSMENT Pt reports recurring ulcers on L foot, CMT diabnosis and has been dealing with neuropathy for decades. 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 Pt reports drinking Juven daily for wound healing. Pt reports difficulty controlling depression with disability and uncontrolled diabetes, states they are working to find appropriate mood stabilizing meds. Pt reports using sliding scale NovoLog  with meals, and Lantus  @60u  nightly, states they mostly stay hyperglycemic, will occasionally experience hypoglycemia if they over bolus or are too active, drinks Gatorade to raise glucose. Pt reports having an appointment with endocrinologist to see about restarting insulin  pump in the next couple of weeks. Pt reports using sample CGM after being hospitalized, found it significantly beneficial, has since ran out and is using finger stick SMBG, pt reports being inconsistent with SMBG and is trying to get prior auth for CGM approved. Pt reports interest in meal planning to help control their diabetes, states they live alone and find it difficult to cook for themselves. Pt states they lack structure in their meal intake. Pt reports having poor dentition, has top teeth capped, states this has made it difficult to eat a wider variety of foods.    Diabetes Self-Management Education - 09/21/23 0950       Visit Information   Visit Type First/Initial      Initial Visit   Diabetes Type Type 2    Date Diagnosed 2013    Are you currently following a meal plan? No    Are you taking your medications as prescribed? No   Taking Lantus  QD     Health Coping   How would you rate your overall health? Poor      Psychosocial Assessment   Patient  Belief/Attitude about Diabetes Defeat/Burnout    What is the hardest part about your diabetes right now, causing you the most concern, or is the most worrisome to you about your diabetes?   Making healty food and beverage choices    Self-care barriers None    Self-management support Doctor's office    Other persons present Patient    Patient Concerns Nutrition/Meal planning;Glycemic Control    Special Needs None    Preferred Learning Style Other (comment)    Learning Readiness Not Ready    How often do you need to have someone help you when you read instructions, pamphlets, or other written materials from your doctor or pharmacy? 1 - Never    What is the last grade level you completed in school? MFA      Pre-Education Assessment   Patient understands the diabetes disease and treatment process. Needs Instruction    Patient understands incorporating nutritional management into lifestyle. Needs Instruction    Patient undertands incorporating physical activity into lifestyle. Needs Instruction    Patient understands using medications safely. Needs Instruction    Patient understands monitoring blood glucose, interpreting and using results Needs Instruction    Patient understands prevention, detection, and treatment of acute complications. Needs Instruction    Patient understands prevention, detection, and treatment of chronic complications. Needs Instruction    Patient understands how to develop strategies to address psychosocial issues. Needs Instruction    Patient understands how to develop strategies to promote health/change behavior. Needs Instruction  Complications   Last HgB A1C per patient/outside source 10.9 %   07/30/2023   How often do you check your blood sugar? 3-4 times/day    Fasting Blood glucose range (mg/dL) >161;096-045    Postprandial Blood glucose range (mg/dL) >409    Number of hyperglycemic episodes ( >200mg /dL): Daily    Can you tell when your blood sugar is high?  Yes    What do you do if your blood sugar is high? Feels a "pressure" in their eyes and acute fatigue.    Have you had a dilated eye exam in the past 12 months? Yes    Have you had a dental exam in the past 12 months? Yes    Are you checking your feet? Yes    How many days per week are you checking your feet? 7      Dietary Intake   Breakfast Yogurt and granola, banana, bagel w/ cream cheese, Protein shake (Took 18u Novolog )    Lunch Chicken salad sandwich on sourdough, apple (Took 17u Novolog )    Snack (afternoon) Chips and Sales promotion account executive, fries, Coke ZERO (Took ~20 u Novolog )    Snack (evening) PB crackers    Beverage(s) Water, unsweet tea, Coke ZERO      Activity / Exercise   Activity / Exercise Type ADL's   N/A     Patient Education   Previous Diabetes Education Yes (please comment)   2020   Disease Pathophysiology Explored patient's options for treatment of their diabetes    Healthy Eating Carbohydrate counting;Food label reading, portion sizes and measuring food.;Role of diet in the treatment of diabetes and the relationship between the three main macronutrients and blood glucose level;Meal timing in regards to the patients' current diabetes medication.    Medications Reviewed patients medication for diabetes, action, purpose, timing of dose and side effects.;Taught/reviewed insulin /injectables, injection, site rotation, insulin /injectables storage and needle disposal.    Monitoring Taught/evaluated CGM (comment);Taught/evaluated SMBG meter.    Acute complications Taught prevention, symptoms, and  treatment of hypoglycemia - the 15 rule.;Discussed and identified patients' prevention, symptoms, and treatment of hyperglycemia.    Chronic complications Relationship between chronic complications and blood glucose control;Assessed and discussed foot care and prevention of foot problems    Diabetes Stress and Support Identified and addressed patients feelings and  concerns about diabetes;Worked with patient to identify barriers to care and solutions    Lifestyle and Health Coping Lifestyle issues that need to be addressed for better diabetes care      Individualized Goals (developed by patient)   Nutrition Carb counting;Follow meal plan discussed    Physical Activity Not Applicable    Medications take my medication as prescribed    Monitoring  Consistenly use CGM;Send in my blood glucose log as discussed   Use CGM   Problem Solving Eating Pattern;Sleep Pattern;Medication consistency;Addressing barriers to behavior change    Reducing Risk examine blood glucose patterns;check ketones if blood glucose over 240mg /dL;treat hypoglycemia with 15 grams of carbs if blood glucose less than 70mg /dL;do foot checks daily    Health Coping Ask for help with psychological, social, or emotional issues      Post-Education Assessment   Patient understands the diabetes disease and treatment process. Needs Review    Patient understands incorporating nutritional management into lifestyle. Needs Review    Patient undertands incorporating physical activity into lifestyle. Needs Review    Patient understands using medications safely. Needs Review    Patient understands  monitoring blood glucose, interpreting and using results Needs Review    Patient understands prevention, detection, and treatment of acute complications. Needs Review    Patient understands prevention, detection, and treatment of chronic complications. Needs Review    Patient understands how to develop strategies to address psychosocial issues. Needs Review    Patient understands how to develop strategies to promote health/change behavior. Needs Review      Outcomes   Expected Outcomes Demonstrated interest in learning but significant barriers to change    Future DMSE 2 months    Program Status Not Completed             Individualized Plan for Diabetes Self-Management Training:   Learning Objective:   Patient will have a greater understanding of diabetes self-management. Patient education plan is to attend individual and/or group sessions per assessed needs and concerns.   Plan:   Patient Instructions  Always check your blood sugar before taking your bolus insulin !!  Choose "Steam In Bag" vegetables as a quick and easy option to increase vegetable intake. Try premade salads as another easy options.  Look into meal delivery services like Factor, Hello Fresh, Purple Carrots, and/or Blue Apron.  Try to keep your carbohydrates to ~3 servings per meal (~40-50g). Use your Carbohydrate handouts to find appropriate portion sizes. Usually, this is about the size of your fist for most foods!  Visit [email or web address].no for meal and snack ideas to help blood sugar control.  Expected Outcomes:  Demonstrated interest in learning but significant barriers to change  Education material provided: My Plate and Carbohydrate counting sheet, Blood sugar logs  If problems or questions, patient to contact team via:  Phone and Email  Future DSME appointment: 2 months

## 2023-09-21 NOTE — Patient Instructions (Addendum)
 Always check your blood sugar before taking your bolus insulin !!  Choose "Steam In Bag" vegetables as a quick and easy option to increase vegetable intake. Try premade salads as another easy options.  Look into meal delivery services like [employer], Hello Fresh, Purple Carrots, and/or Blue Apr[employer].  Try to keep your carbohydrates to ~3 servings per meal (~40-50g). Use your Carbohydrate handouts to find appropriate portion sizes. Usually, this is about the size of your fist for most foods!  Visit [email or web address] for meal and snack ideas to help blood sugar control.

## 2023-09-22 ENCOUNTER — Emergency Department (HOSPITAL_COMMUNITY)
Admission: EM | Admit: 2023-09-22 | Discharge: 2023-09-22 | Disposition: A | Discharge status: Home / Self Care | Attending: Emergency Medicine | Admitting: Emergency Medicine

## 2023-09-22 ENCOUNTER — Other Ambulatory Visit: Payer: Self-pay

## 2023-09-22 ENCOUNTER — Emergency Department (HOSPITAL_COMMUNITY)

## 2023-09-22 ENCOUNTER — Encounter (HOSPITAL_COMMUNITY): Payer: Self-pay | Admitting: Emergency Medicine

## 2023-09-22 DIAGNOSIS — K529 Noninfective gastroenteritis and colitis, unspecified: Secondary | ICD-10-CM | POA: Diagnosis not present

## 2023-09-22 DIAGNOSIS — I10 Essential (primary) hypertension: Secondary | ICD-10-CM | POA: Insufficient documentation

## 2023-09-22 DIAGNOSIS — F419 Anxiety disorder, unspecified: Secondary | ICD-10-CM

## 2023-09-22 DIAGNOSIS — Z794 Long term (current) use of insulin: Secondary | ICD-10-CM | POA: Diagnosis not present

## 2023-09-22 DIAGNOSIS — E1165 Type 2 diabetes mellitus with hyperglycemia: Secondary | ICD-10-CM | POA: Diagnosis not present

## 2023-09-22 DIAGNOSIS — Z7984 Long term (current) use of oral hypoglycemic drugs: Secondary | ICD-10-CM | POA: Diagnosis not present

## 2023-09-22 DIAGNOSIS — R739 Hyperglycemia, unspecified: Secondary | ICD-10-CM

## 2023-09-22 DIAGNOSIS — R112 Nausea with vomiting, unspecified: Secondary | ICD-10-CM | POA: Diagnosis present

## 2023-09-22 DIAGNOSIS — Z7982 Long term (current) use of aspirin: Secondary | ICD-10-CM | POA: Diagnosis not present

## 2023-09-22 LAB — I-STAT VENOUS BLOOD GAS, ED
Acid-base deficit: 1 mmol/L (ref 0.0–2.0)
Bicarbonate: 20.8 mmol/L (ref 20.0–28.0)
Calcium, Ion: 1.16 mmol/L (ref 1.15–1.40)
HCT: 43 % (ref 39.0–52.0)
Hemoglobin: 14.6 g/dL (ref 13.0–17.0)
O2 Saturation: 83 %
Potassium: 3.8 mmol/L (ref 3.5–5.1)
Sodium: 134 mmol/L — ABNORMAL LOW (ref 135–145)
TCO2: 22 mmol/L (ref 22–32)
pCO2, Ven: 28.2 mmHg — ABNORMAL LOW (ref 44–60)
pH, Ven: 7.475 — ABNORMAL HIGH (ref 7.25–7.43)
pO2, Ven: 43 mmHg (ref 32–45)

## 2023-09-22 LAB — HEPATIC FUNCTION PANEL
ALT: 31 U/L (ref 0–44)
AST: 30 U/L (ref 15–41)
Albumin: 4.2 g/dL (ref 3.5–5.0)
Alkaline Phosphatase: 66 U/L (ref 38–126)
Bilirubin, Direct: 0.3 mg/dL — ABNORMAL HIGH (ref 0.0–0.2)
Indirect Bilirubin: 1.8 mg/dL — ABNORMAL HIGH (ref 0.3–0.9)
Total Bilirubin: 2.1 mg/dL — ABNORMAL HIGH (ref 0.0–1.2)
Total Protein: 7.6 g/dL (ref 6.5–8.1)

## 2023-09-22 LAB — BASIC METABOLIC PANEL WITH GFR
Anion gap: 17 — ABNORMAL HIGH (ref 5–15)
BUN: 14 mg/dL (ref 6–20)
CO2: 19 mmol/L — ABNORMAL LOW (ref 22–32)
Calcium: 10.2 mg/dL (ref 8.9–10.3)
Chloride: 96 mmol/L — ABNORMAL LOW (ref 98–111)
Creatinine, Ser: 1.21 mg/dL (ref 0.61–1.24)
GFR, Estimated: >60 mL/min (ref >60)
Glucose, Bld: 327 mg/dL — ABNORMAL HIGH (ref 70–99)
Potassium: 3.8 mmol/L (ref 3.5–5.1)
Sodium: 132 mmol/L — ABNORMAL LOW (ref 135–145)

## 2023-09-22 LAB — CBC
HCT: 43.5 % (ref 39.0–52.0)
Hemoglobin: 14.9 g/dL (ref 13.0–17.0)
MCH: 28.7 pg (ref 26.0–34.0)
MCHC: 34.3 g/dL (ref 30.0–36.0)
MCV: 83.7 fL (ref 80.0–100.0)
Platelets: 252 10*3/uL (ref 150–400)
RBC: 5.2 MIL/uL (ref 4.22–5.81)
RDW: 13.2 % (ref 11.5–15.5)
WBC: 10.1 10*3/uL (ref 4.0–10.5)
nRBC: 0 % (ref 0.0–0.2)

## 2023-09-22 LAB — I-STAT CG4 LACTIC ACID, ED
Lactic Acid, Venous: 2.8 mmol/L (ref 0.5–1.9)
Lactic Acid, Venous: 2.8 mmol/L (ref 0.5–1.9)

## 2023-09-22 LAB — BETA-HYDROXYBUTYRIC ACID: Beta-Hydroxybutyric Acid: 1.59 mmol/L — ABNORMAL HIGH (ref 0.05–0.27)

## 2023-09-22 LAB — CBG MONITORING, ED
Glucose-Capillary: 316 mg/dL — ABNORMAL HIGH (ref 70–99)
Glucose-Capillary: 270 mg/dL — ABNORMAL HIGH (ref 70–99)

## 2023-09-22 LAB — TROPONIN I (HIGH SENSITIVITY)
Troponin I (High Sensitivity): 4 ng/L (ref <18)
Troponin I (High Sensitivity): 5 ng/L (ref <18)

## 2023-09-22 LAB — LIPASE, BLOOD: Lipase: 48 U/L (ref 11–51)

## 2023-09-22 MED ORDER — LORAZEPAM 1 MG PO TABS
1.0000 mg | ORAL_TABLET | Freq: Once | ORAL | Status: AC
Start: 2023-09-22 — End: 2023-09-22
  Administered 2023-09-22: 1 mg via ORAL
  Filled 2023-09-22: qty 1

## 2023-09-22 MED ORDER — LORAZEPAM 1 MG PO TABS
1.0000 mg | ORAL_TABLET | Freq: Once | ORAL | Status: AC
  Administered 2023-09-22: 1 mg via ORAL
  Filled 2023-09-22: qty 1

## 2023-09-22 MED ORDER — LISINOPRIL 10 MG PO TABS: 5.0000 mg | ORAL_TABLET | Freq: Once | ORAL | Status: DC

## 2023-09-22 MED ORDER — IOHEXOL 350 MG/ML SOLN
100.0000 mL | Freq: Once | INTRAVENOUS | Status: AC | PRN
  Administered 2023-09-22: 100 mL via INTRAVENOUS

## 2023-09-22 MED ORDER — ONDANSETRON HCL 4 MG/2ML IJ SOLN
4.0000 mg | Freq: Once | INTRAMUSCULAR | Status: AC
  Administered 2023-09-22: 4 mg via INTRAVENOUS
  Filled 2023-09-22: qty 2

## 2023-09-22 MED ORDER — MORPHINE SULFATE (PF) 4 MG/ML IV SOLN
4.0000 mg | Freq: Once | INTRAVENOUS | Status: AC
  Administered 2023-09-22: 4 mg via INTRAVENOUS
  Filled 2023-09-22: qty 1

## 2023-09-22 MED ORDER — ONDANSETRON HCL 4 MG PO TABS: 4.0000 mg | ORAL_TABLET | Freq: Four times a day (QID) | ORAL | 0 refills | Status: DC

## 2023-09-22 MED ORDER — DROPERIDOL 2.5 MG/ML IJ SOLN
1.2500 mg | Freq: Once | INTRAMUSCULAR | Status: AC
  Administered 2023-09-22: 1.25 mg via INTRAVENOUS
  Filled 2023-09-22: qty 2

## 2023-09-22 MED ORDER — LACTATED RINGERS IV BOLUS
1000.0000 mL | Freq: Once | INTRAVENOUS | Status: AC
  Administered 2023-09-22: 1000 mL via INTRAVENOUS

## 2023-09-22 MED ORDER — DICYCLOMINE HCL 20 MG PO TABS: 20.0000 mg | ORAL_TABLET | Freq: Two times a day (BID) | ORAL | 0 refills | Status: DC

## 2023-09-22 NOTE — ED Triage Notes (Signed)
 Pt BIB by EMS for N/VD and generalized weakness since 1000. Endorses CP x 1 hours ago. Denies any movement or radiation. Anxious on arrival. Per EMS, 12-lead unremarkable.  EMS VS: BP 174/88  HR 58 100% RA CBG 388

## 2023-09-22 NOTE — ED Notes (Signed)
 Pt given water for PO challenge.

## 2023-09-22 NOTE — ED Provider Notes (Signed)
 [name] Provider Note   CSN: [id number] Arrival date & time: 09/22/23  1604     History  Chief Complaint  Patient presents with   Chest Pain    [name] is a 58 y.o. male.  Patient is a 58 year old male with a past medical history of diabetes, hypertension, ADHD presenting to the emergency department with nausea, vomiting and abdominal pain.  The patient states that he woke up this morning feeling ill.  He states that he is having pain across his upper abdomen that goes into his chest.  He states that he feels short of breath and has pain with taking deep breaths.  He states that he has had associated diarrhea.  He states that his stools appear brown.  Denies any dysuria or hematuria.  Denies any fevers but states that he is having hot and cold sweats.  Of note he states that he did have a recent surgery in February and was admitted to the hospital for 9 days.  Denies any blood thinner use or prior history of VTE.  The history is provided by the patient.  Chest Pain      Home Medications Prior to Admission medications   Medication Sig Start Date End Date Taking? Authorizing Provider  dicyclomine  (BENTYL ) 20 MG tablet Take 1 tablet (20 mg total) by mouth 2 (two) times daily. 09/22/23  Yes Kingsley, Melanye Hiraldo K, DO  ondansetron  (ZOFRAN ) 4 MG tablet Take 1 tablet (4 mg total) by mouth every 6 (six) hours. 09/22/23  Yes Nora Beal, Cordarrel Stiefel K, DO  acetaminophen  (TYLENOL ) 325 MG tablet Take 650 mg by mouth every 6 (six) hours as needed for mild pain or headache.    [provider]  aspirin  81 MG tablet Take 1 tablet (81 mg total) by mouth daily. 07/17/17   Doroteo Gasmen, MD  atorvastatin  (LIPITOR) 10 MG tablet Take 10 mg by mouth daily. 04/03/20   [provider]  Blood Glucose Monitoring Suppl DEVI 1 each by Does not apply route 3 (three) times daily. May dispense any manufacturer covered by patient's insurance.  08/07/23   Kraig Peru, MD  calcium  carbonate (TUMS - DOSED IN MG ELEMENTAL CALCIUM ) 500 MG chewable tablet Chew 1 tablet by mouth daily as needed for indigestion.    [provider]  Continuous Blood Gluc Receiver (FREESTYLE LIBRE 14 DAY READER) DEVI every 14 (fourteen) days.  Patient not taking: Reported on 09/21/2023 01/05/18   [provider]  Continuous Blood Gluc Sensor (FREESTYLE LIBRE 14 DAY SENSOR) MISC  04/14/18   [provider]  Desvenlafaxine ER 100 MG TB24 Take 100 mg by mouth daily.    [provider]  dextroamphetamine  (DEXTROSTAT ) 10 MG tablet Take 20 mg by mouth in the morning and at bedtime.    [provider]  doxycycline  (VIBRA -TABS) 100 MG tablet Take 1 tablet (100 mg total) by mouth 2 (two) times daily. 08/21/23   Velma Ghazi, DPM  fenofibrate  160 MG tablet Take 160 mg by mouth daily.    [provider]  gabapentin  (NEURONTIN ) 800 MG tablet Take 800 mg by mouth 3 (three) times daily.    [provider]  Glucagon  (GVOKE HYPOPEN  2-PACK) 1 MG/0.2ML SOAJ Inject 1 mg into the skin as needed for up to 2 doses (Severe low blood sugar). 08/07/23   Kraig Peru, MD  Glucose Blood (BLOOD GLUCOSE TEST STRIPS) STRP 1 each by Does not apply route  3 (three) times daily. Use as directed to check blood sugar. May dispense any manufacturer covered by patient's insurance and fits patient's device. 08/07/23   Kraig Peru, MD  insulin  aspart (NOVOLOG ) 100 UNIT/ML FlexPen Inject 0-6 Units into the skin 3 (three) times daily with meals. Check Blood Glucose (BG) and inject per scale: BG <150= 0 unit; BG 150-200= 1 unit; BG 201-250= 2 unit; BG 251-300= 3 unit; BG 301-350= 4 unit; BG 351-400= 5 unit; BG >400= 6 unit and Call Primary Care. 08/07/23   Kraig Peru, MD  insulin  glargine (LANTUS ) 100 UNIT/ML Solostar Pen Inject 15 Units into the skin 2 (two) times daily. May substitute as needed per insurance. 08/07/23   Kraig Peru, MD   Insulin  Pen Needle (PEN NEEDLES) 31G X 5 MM MISC 1 each by Does not apply route 3 (three) times daily. May dispense any manufacturer covered by patient's insurance. 08/07/23   Kraig Peru, MD  Lancet Device MISC 1 each by Does not apply route 3 (three) times daily. May dispense any manufacturer covered by patient's insurance. 08/07/23   Kraig Peru, MD  Lancets MISC 1 each by Does not apply route 3 (three) times daily. Use as directed to check blood sugar. May dispense any manufacturer covered by patient's insurance and fits patient's device. 08/07/23   Kraig Peru, MD  lumateperone  tosylate (CAPLYTA ) 42 MG capsule Take 42 mg by mouth daily.    [provider]  ondansetron  (ZOFRAN  ODT) 4 MG disintegrating tablet Take 1 tablet (4 mg total) by mouth every 8 (eight) hours as needed for nausea. 02/05/21   Coretha Dew, PA-C  oxyCODONE  (OXY IR/ROXICODONE ) 5 MG immediate release tablet Take 1 tablet (5 mg total) by mouth every 6 (six) hours as needed for moderate pain (pain score 4-6) or severe pain (pain score 7-10). 08/07/23   Kraig Peru, MD  pantoprazole  (PROTONIX ) 40 MG tablet Take 1 tablet (40 mg total) by mouth daily before breakfast. 08/07/23   Kraig Peru, MD  TECHLITE PEN NEEDLES 31G X 8 MM MISC USE TO GIVE INSULIN  4 TIMES DAILY 09/01/19   [provider]  zolpidem  (AMBIEN ) 10 MG tablet Take 10 mg by mouth at bedtime.    [provider]      Allergies    Lithium, Adderall [amphetamine -dextroamphetamine ], and Ritalin [methylphenidate]    Review of Systems   Review of Systems  Cardiovascular:  Positive for chest pain.    Physical Exam Updated Vital Signs BP (!) 153/81   Pulse 88   Temp 98.1 F (36.7 C)   Resp 14   SpO2 100%  Physical Exam Vitals and nursing note reviewed.  Constitutional:      General: He is not in acute distress.    Appearance: He is well-developed.     Comments: Anxious appearing, unable to sit still  HENT:     Head:  Normocephalic.  Eyes:     Extraocular Movements: Extraocular movements intact.  Cardiovascular:     Rate and Rhythm: Normal rate and regular rhythm.     Pulses:          Radial pulses are 2+ on the right side and 2+ on the left side.     Heart sounds: Normal heart sounds.  Pulmonary:     Effort: Pulmonary effort is normal.     Breath sounds: Normal breath sounds.  Chest:     Chest wall: Tenderness: Upper abdomen.  Abdominal:  Palpations: Abdomen is soft.     Tenderness: There is abdominal tenderness (Upper abdomen). There is no guarding or rebound.  Musculoskeletal:        General: Normal range of motion.     Cervical back: Normal range of motion and neck supple.     Right lower leg: No edema.     Left lower leg: No edema.  Skin:    General: Skin is warm and dry.  Neurological:     General: No focal deficit present.     Mental Status: He is alert and oriented to person, place, and time.  Psychiatric:        Mood and Affect: Mood is anxious.        Behavior: Behavior normal.     ED Results / Procedures / Treatments   Labs (all labs ordered are listed, but only abnormal results are displayed) Labs Reviewed  BASIC METABOLIC PANEL WITH GFR - Abnormal; Notable for the following components:      Result Value   Sodium 132 (*)    Chloride 96 (*)    CO2 19 (*)    Glucose, Bld 327 (*)    Anion gap 17 (*)    All other components within normal limits  HEPATIC FUNCTION PANEL - Abnormal; Notable for the following components:   Total Bilirubin 2.1 (*)    Bilirubin, Direct 0.3 (*)    Indirect Bilirubin 1.8 (*)    All other components within normal limits  BETA-HYDROXYBUTYRIC ACID - Abnormal; Notable for the following components:   Beta-Hydroxybutyric Acid 1.59 (*)    All other components within normal limits  CBG MONITORING, ED - Abnormal; Notable for the following components:   Glucose-Capillary 316 (*)    All other components within normal limits  I-STAT VENOUS BLOOD GAS,  ED - Abnormal; Notable for the following components:   pH, Ven 7.475 (*)    pCO2, Ven 28.2 (*)    Sodium 134 (*)    All other components within normal limits  I-STAT CG4 LACTIC ACID, ED - Abnormal; Notable for the following components:   Lactic Acid, Venous 2.8 (*)    All other components within normal limits  I-STAT CG4 LACTIC ACID, ED - Abnormal; Notable for the following components:   Lactic Acid, Venous 2.8 (*)    All other components within normal limits  CBG MONITORING, ED - Abnormal; Notable for the following components:   Glucose-Capillary 270 (*)    All other components within normal limits  CBC  LIPASE, BLOOD  TROPONIN I (HIGH SENSITIVITY)  TROPONIN I (HIGH SENSITIVITY)    EKG EKG Interpretation Date/Time:  Tuesday September 22 2023 16:25:31 EDT Ventricular Rate:  61 PR Interval:    QRS Duration:  97 QT Interval:  427 QTC Calculation: 431 R Axis:   167  Text Interpretation: Normal sinus rhythm Low voltage, extremity leads RVH with secondary repolarization abnrm Anterolateral infarct, age indeterminate Non-specific t-wave inversions new compared to prior EKG Confirmed by Celesta Coke ([id number]) on 09/22/2023 4:39:00 PM  Radiology CT Angio Chest/Abd/Pel for Dissection W and/or W/WO Result Date: 09/22/2023 CLINICAL DATA:  Acute aortic syndrome suspected EXAM: CT ANGIOGRAPHY CHEST, ABDOMEN AND PELVIS TECHNIQUE: Non-contrast CT of the chest was initially obtained. Multidetector CT imaging through the chest, abdomen and pelvis was performed using the standard protocol during bolus administration of intravenous contrast. Multiplanar reconstructed images and MIPs were obtained and reviewed to evaluate the vascular anatomy. RADIATION DOSE REDUCTION: This exam was performed according to  the departmental dose-optimization program which includes automated exposure control, adjustment of the mA and/or kV according to patient size and/or use of iterative reconstruction technique. CONTRAST:   OMNIPAQUE  IOHEXOL  350 MG/ML SOLN COMPARISON:  CT abdomen and pelvis 02/05/2021. FINDINGS: CTA CHEST FINDINGS Cardiovascular: Preferential opacification of the thoracic aorta. No evidence of thoracic aortic aneurysm or dissection. Normal heart size. No pericardial effusion. Mediastinum/Nodes: No enlarged mediastinal, hilar, or axillary lymph nodes. Thyroid gland, trachea, and esophagus demonstrate no significant findings. Lungs/Pleura: Lungs are clear. No pleural effusion or pneumothorax. Musculoskeletal: No chest wall abnormality. No acute or significant osseous findings. Review of the MIP images confirms the above findings. CTA ABDOMEN AND PELVIS FINDINGS VASCULAR Aorta: Normal caliber aorta without aneurysm, dissection, vasculitis or significant stenosis. There are atherosclerotic calcifications of the aorta. Celiac: Patent without evidence of aneurysm, dissection, vasculitis or significant stenosis. SMA: Patent without evidence of aneurysm, dissection, vasculitis or significant stenosis. Renals: Both renal arteries are patent without evidence of aneurysm, dissection, vasculitis, fibromuscular dysplasia or significant stenosis. IMA: Patent without evidence of aneurysm, dissection, vasculitis or significant stenosis. Inflow: Patent without evidence of aneurysm, dissection, vasculitis or significant stenosis. Veins: No obvious venous abnormality within the limitations of this arterial phase study. Review of the MIP images confirms the above findings. NON-VASCULAR Hepatobiliary: No focal liver abnormality is seen. No gallstones, gallbladder wall thickening, or biliary dilatation. Pancreas: Unremarkable. No pancreatic ductal dilatation or surrounding inflammatory changes. Spleen: Normal in size without focal abnormality. Adrenals/Urinary Tract: Adrenal glands are unremarkable. Kidneys are normal, without renal calculi, focal lesion, or hydronephrosis. Bladder is unremarkable. Stomach/Bowel: Stomach is within  normal limits. Appendix appears normal. No evidence of bowel wall thickening, distention, or inflammatory changes. There are scattered colonic diverticula. Lymphatic: No enlarged lymph nodes are seen. Reproductive: Prostate is unremarkable. Other: There is a small fat containing left inguinal hernia. There is no ascites. Musculoskeletal: No acute or significant osseous findings. Review of the MIP images confirms the above findings. IMPRESSION: 1. No evidence for aortic aneurysm or dissection. 2. No acute localizing process in the chest, abdomen or pelvis. 3. Colonic diverticulosis without evidence for diverticulitis. 4. Small fat containing left inguinal hernia. Electronically Signed   By: Tyron Gallon M.D.   On: 09/22/2023 19:29   DG Chest 2 View Result Date: 09/22/2023 CLINICAL DATA:  Chest pain. EXAM: CHEST - 2 VIEW COMPARISON:  May 06, 2018. FINDINGS: The heart size and mediastinal contours are within normal limits. Stable probable left lingular scarring. No acute pulmonary disease is noted. Status post surgical internal fixation of old left clavicular fracture. IMPRESSION: No active cardiopulmonary disease. Electronically Signed   By: Rosalene Colon M.D.   On: 09/22/2023 17:59    Procedures Procedures    Medications Ordered in ED Medications  lisinopril  (ZESTRIL ) tablet 5 mg (5 mg Oral Not Given 09/22/23 1933)  lactated ringers  bolus 1,000 mL (0 mLs Intravenous Stopped 09/22/23 1901)  morphine  (PF) 4 MG/ML injection 4 mg (4 mg Intravenous Given 09/22/23 1705)  ondansetron  (ZOFRAN ) injection 4 mg (4 mg Intravenous Given 09/22/23 1704)  LORazepam  (ATIVAN ) tablet 1 mg (1 mg Oral Given 09/22/23 1701)  droperidol  (INAPSINE ) 2.5 MG/ML injection 1.25 mg (1.25 mg Intravenous Given 09/22/23 1859)  iohexol  (OMNIPAQUE ) 350 MG/ML injection 100 mL (100 mLs Intravenous Contrast Given 09/22/23 1846)  lactated ringers  bolus 1,000 mL (0 mLs Intravenous Stopped 09/22/23 2041)  LORazepam  (ATIVAN ) tablet 1 mg (1  mg Oral Given 09/22/23 2038)    ED Course/ Medical Decision Making/  A&P Clinical Course as of 09/22/23 2131  Tue Sep 22, 2023  1734 Non-acidotic on VBG. Mildly elevated AGAP and BHB and lactic will be added on. [VK]  [id number] Patient reported to RN he vomited again and anxious feeling is returning. Will be given droperidol . [VK]  1911 Lactic 2.8, likely cause of elevated AGAP. Will need repeat. CT read is pending at this time. BP has remained elevated, will start antihypertensive. [VK]  1948 No acute abnormality on CT imaging. Repeat lactic and Troponin pending. [VK]  2038 Patient reports nausea and pain improved, still feeling anxious. Will be given additional ativan  for anxiety and will have PO challenge with repeat glucose. Suspect gastroenteritis. [VK]  2129 Upon reassessment, patient is tolerating PO. He is stable for discharge home with outpatient follow up. [VK]    Clinical Course User Index [VK] Kingsley, Asiana Benninger K, DO                                 Medical Decision Making This patient presents to the ED with chief complaint(s) of abdominal pain, chest pain with pertinent past medical history of hypertension, diabetes, ADHD which further complicates the presenting complaint. The complaint involves an extensive differential diagnosis and also carries with it a high risk of complications and morbidity.    The differential diagnosis includes ACS, arrhythmia, anemia, pneumonia, pneumothorax, pulmonary edema, pleural effusion, gastritis, GERD, gastroenteritis, pancreatitis, hepatitis, DKA or other hyperglycemic crisis, dissection, PE, dehydration, electrolyte abnormality  Additional history obtained: Additional history obtained from EMS  Records reviewed previous admission documents and Care Everywhere/External Records  ED Course and Reassessment: On patient's arrival he was hypertensive and anxious appearing otherwise hemodynamically stable in no acute distress.  EKG on arrival had some  nonspecific T wave inversions.  Patient will have labs including troponin as well as evaluate for hyperglycemic crisis.  Will have CT dissection study to evaluate for possible dissection, PE or other intra-abdominal infection.  He will be given pain and nausea control and will be closely reassessed.  Independent labs interpretation:  The following labs were independently interpreted: hyperglycemia without DKA, mild lactic acidosis  Independent visualization of imaging: - I independently visualized the following imaging with scope of interpretation limited to determining acute life threatening conditions related to emergency care: CXR, CT dissection study, which revealed no acute disease  Consultation: - Consulted or discussed management/test interpretation w/ external professional: N/A  Consideration for admission or further workup: Patient has no emergent conditions requiring admission or further work-up at this time and is stable for discharge home with primary care follow-up  Social Determinants of health: N/A    Amount and/or Complexity of Data Reviewed Labs: ordered. Radiology: ordered.  Risk Prescription drug management.          Final Clinical Impression(s) / ED Diagnoses Final diagnoses:  Gastroenteritis  Hyperglycemia  Anxiety    Rx / DC Orders ED Discharge Orders          Ordered    ondansetron  (ZOFRAN ) 4 MG tablet  Every 6 hours        09/22/23 2130    dicyclomine  (BENTYL ) 20 MG tablet  2 times daily        09/22/23 2130              Kingsley, Adamarys Shall K, [place] 09/22/23 2131

## 2023-09-22 NOTE — Discharge Instructions (Signed)
 You were seen in the emergency department for your abdominal pain, nausea, vomiting and diarrhea.  Your workup showed no signs of intra-abdominal infection, heart attack or blood clots.  You likely have gastroenteritis which is a viral infection.  You can take Zofran  as needed for nausea and Tylenol  or Bentyl  as needed for abdominal pain or cramping.  You can take over-the-counter Imodium for diarrhea.  You should follow-up with your primary doctor in the next few days to have your symptoms rechecked as well as your blood sugar rechecked.  You should keep a log of your blood sugars at home and bring this to your primary doctor.  You can also discuss with your primary doctor regarding your anxiety and to see if you need to be started on any anxiety medications.  You should return to the emergency department if you are having repetitive vomiting despite the nausea medicine, significantly worsening pain, your blood sugars are too high to read or if you have any other new or concerning symptoms.

## 2023-09-25 NOTE — Therapy (Deleted)
 OUTPATIENT PHYSICAL THERAPY LOWER EXTREMITY EVALUATION   Patient Name: [name] MRN: [id number] DOB:1965-11-24, 58 y.o., male Today's Date: 09/25/2023  END OF SESSION:   Past Medical History:  Diagnosis Date   ADHD (attention deficit hyperactivity disorder)    Anxiety and depression    Candida esophagitis (HCC)    in setting of DKA/hyperglycemia   CIDP (chronic inflammatory demyelinating polyneuropathy) (HCC)    Depression    Diabetes type 2, controlled (HCC)    Diverticulosis    Essential hypertension 06/21/2014   Hyperlipidemia    Kidney stones    Pancreatitis    Stomach ulcer    Vitamin D deficiency    Past Surgical History:  Procedure Laterality Date   ANKLE SURGERY     CERVICAL DISC SURGERY     C4-C5   ELBOW SURGERY     ESOPHAGOGASTRODUODENOSCOPY N/A 07/16/2017   Procedure: ESOPHAGOGASTRODUODENOSCOPY (EGD);  Surgeon: Kenney Peacemaker, MD;  Location: [facility] ENDOSCOPY;  Service: Endoscopy;  Laterality: N/A;   IRRIGATION AND DEBRIDEMENT FOOT Left 07/31/2023   Procedure: IRRIGATION AND DEBRIDEMENT FOOT AND FIFTH MET RESCTION;  Surgeon: Evertt Hoe, DPM;  Location: [facility] OR;  Service: Orthopedics/Podiatry;  Laterality: Left;   RETINAL DETACHMENT SURGERY     RHINOPLASTY     VASECTOMY     WOUND DEBRIDEMENT Left 03/27/2018   Procedure: DEBRIDEMENT WOUND;  Surgeon: Charity Conch, DPM;  Location: [facility] OR;  Service: Podiatry;  Laterality: Left;   Patient Active Problem List   Diagnosis Date Noted   Thrombophlebitis arm 08/03/2023   Hypokalemia 07/30/2023   Osteomyelitis (HCC) 07/29/2023   Foot ulcer with fat layer exposed, left (HCC) 11/15/2019   Displaced fracture of shaft of left clavicle, initial encounter for closed fracture 03/15/2019   Dupuytren's contracture 07/29/2018   PICC (peripherally inserted central catheter) in place 05/24/2018   Neuropathy 05/12/2018   Osteomyelitis of ankle or foot, acute, left (HCC) 05/05/2018   Diabetic ulcer of left  midfoot associated with type 2 diabetes mellitus, limited to breakdown of skin (HCC) 12/18/2017   Chronic pain syndrome 11/03/2017   Gastroesophageal reflux disease with esophagitis without hemorrhage 09/17/2017   Esophageal dysphagia    DKA, type 2 (HCC) 07/15/2017   ADD (attention deficit disorder) 07/15/2017   DKA (diabetic ketoacidosis) (HCC) 07/15/2017   Vitamin B12 deficiency (non anemic) 08/13/2016   Adhesive capsulitis of left shoulder 01/18/2016   Trigger ring finger of right hand 10/11/2015   Primary osteoarthritis of first carpometacarpal joint of left hand 06/14/2015   Radial styloid tenosynovitis 06/14/2015   Depression 10/05/2014   Noncompliance with diabetes treatment 10/05/2014   Vitamin D deficiency 09/14/2014   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   Diabetic peripheral neuropathy associated with type 2 diabetes mellitus (HCC) 06/21/2014   Essential hypertension 06/21/2014   Erectile dysfunction associated with type 2 diabetes mellitus (HCC) 06/21/2014   Long-term current use of insulin  for diabetes mellitus (HCC) 06/21/2014   Adhesive capsulitis of right shoulder 03/24/2011   Retinal detachment 12/06/2010   Rotator cuff syndrome 12/06/2010   History of pancreatitis 10/22/2010   Hypertriglyceridemia 10/22/2010   Diabetes mellitus type II, uncontrolled 10/22/2010    PCP: Mariella Shore MD  REFERRING PROVIDER: Evertt Hoe, DPM  REFERRING DIAG: [phone] (ICD-10-CM) - Post-operative state M86.172 (ICD-10-CM) - Osteomyelitis of ankle or foot, acute, left (HCC)  THERAPY DIAG:  No diagnosis found.  Rationale for Evaluation and Treatment: Rehabilitation  ONSET DATE: ***  SUBJECTIVE:   SUBJECTIVE STATEMENT: ***  PERTINENT HISTORY:  MD note: Has been doing Betadine dressings. Recently saw Dr. Mabel Savage who discussed possible referral to Dr. Julio Ohm for below-the-knee amputation. He reports overall he was very concerned about the small opening that had  developed but he feels it is improving at this point in time. More so he is just discouraged that the length of time he has been dealing with the ulceration.  DOS: 07/31/2023 Procedure: 1.  I&D with resection  fifth metatarsal, left foot 2.  Application dissolvable antibiotic calcium  sulfate beads, left foot 3.  Application of acellular dermal graft 38 cm left foot 4.  Complex wound closure, 6 x 1 x 1.5 cm, left foot   58 y.o. male seen for post op check.  Now approximately 1 month status post above procedures.  Patient reports he has been doing Betadine wet-to-dry dressings on a small opening on the incision on his left foot.  He reports some drainage from this area.  On a course of doxycycline  currently.  PAIN:  Are you having pain? Yes: NPRS scale: *** Pain location: *** Pain description: *** Aggravating factors: *** Relieving factors: ***  PRECAUTIONS: {Therapy precautions:24002}  RED FLAGS: {PT Red Flags:29287}   WEIGHT BEARING RESTRICTIONS: {Yes ***/No:24003}  FALLS:  Has patient fallen in last 6 months? {fallsyesno:27318}  LIVING ENVIRONMENT: Lives with: {OPRC lives with:25569::"lives with their family"} Lives in: {Lives in:25570} Stairs: {opstairs:27293} Has following equipment at home: {Assistive devices:23999}  OCCUPATION: ***  PLOF: {PLOF:24004}  PATIENT GOALS: ***  NEXT MD VISIT: ***  OBJECTIVE:  Note: Objective measures were completed at Evaluation unless otherwise noted.  DIAGNOSTIC FINDINGS: ***  PATIENT SURVEYS:  {rehab surveys:24030}  COGNITION: Overall cognitive status: {cognition:24006}     SENSATION: {sensation:27233}  EDEMA:  {edema:24020}  MUSCLE LENGTH: Hamstrings: Right *** deg; Left *** deg Andy Bannister test: Right *** deg; Left *** deg  POSTURE: {posture:25561}  PALPATION: ***  LOWER EXTREMITY ROM:  {AROM/PROM:27142} ROM Right eval Left eval  Hip flexion    Hip extension    Hip abduction    Hip adduction    Hip internal  rotation    Hip external rotation    Knee flexion    Knee extension    Ankle dorsiflexion    Ankle plantarflexion    Ankle inversion    Ankle eversion     (Blank rows = not tested)  LOWER EXTREMITY MMT:  MMT Right eval Left eval  Hip flexion    Hip extension    Hip abduction    Hip adduction    Hip internal rotation    Hip external rotation    Knee flexion    Knee extension    Ankle dorsiflexion    Ankle plantarflexion    Ankle inversion    Ankle eversion     (Blank rows = not tested)  LOWER EXTREMITY SPECIAL TESTS:  {LEspecialtests:26242}  FUNCTIONAL TESTS:  {Functional tests:24029}  GAIT: Distance walked: *** Assistive device utilized: {Assistive devices:23999} Level of assistance: {Levels of assistance:24026} Comments: ***  TREATMENT DATE: ***    PATIENT EDUCATION:  Education details: *** Person educated: {Person educated:25204} Education method: {Education Method:25205} Education comprehension: {Education Comprehension:25206}  HOME EXERCISE PROGRAM: ***  ASSESSMENT:  CLINICAL IMPRESSION: Patient is a *** y.o. *** who was seen today for physical therapy evaluation and treatment for ***.   OBJECTIVE IMPAIRMENTS: {opptimpairments:25111}.   ACTIVITY LIMITATIONS: {activitylimitations:27494}  PARTICIPATION LIMITATIONS: {participationrestrictions:25113}  PERSONAL FACTORS: {Personal factors:25162} are also affecting patient's functional outcome.   REHAB POTENTIAL: {rehabpotential:25112}  CLINICAL DECISION MAKING: {clinical decision making:25114}  EVALUATION COMPLEXITY: {Evaluation complexity:25115}   GOALS: Goals reviewed with patient? {yes/no:20286}  SHORT TERM GOALS: Target date: *** *** Baseline: Goal status: INITIAL  2.  *** Baseline:  Goal status: INITIAL  3.  *** Baseline:  Goal status: INITIAL  4.   *** Baseline:  Goal status: INITIAL  5.  *** Baseline:  Goal status: INITIAL  6.  *** Baseline:  Goal status: INITIAL  LONG TERM GOALS: Target date: ***  *** Baseline:  Goal status: INITIAL  2.  *** Baseline:  Goal status: INITIAL  3.  *** Baseline:  Goal status: INITIAL  4.  *** Baseline:  Goal status: INITIAL  5.  *** Baseline:  Goal status: INITIAL  6.  *** Baseline:  Goal status: INITIAL   PLAN:  PT FREQUENCY: {rehab frequency:25116}  PT DURATION: {rehab duration:25117}  PLANNED INTERVENTIONS: {rehab planned interventions:25118::"97110-Therapeutic exercises","97530- Therapeutic [phone]- Neuromuscular re-education","97535- Self MVHQ","46962- Manual therapy"}  PLAN FOR NEXT SESSION: ***   Keeley Sussman, PT 09/25/2023, 11:48 AM

## 2023-09-28 ENCOUNTER — Ambulatory Visit: Admitting: Physical Therapy

## 2023-10-06 ENCOUNTER — Ambulatory Visit (INDEPENDENT_AMBULATORY_CARE_PROVIDER_SITE_OTHER): Admitting: Podiatry

## 2023-10-06 DIAGNOSIS — Z9889 Other specified postprocedural states: Secondary | ICD-10-CM

## 2023-10-06 DIAGNOSIS — M216X9 Other acquired deformities of unspecified foot: Secondary | ICD-10-CM

## 2023-10-06 DIAGNOSIS — M86172 Other acute osteomyelitis, left ankle and foot: Secondary | ICD-10-CM

## 2023-10-06 DIAGNOSIS — L97522 Non-pressure chronic ulcer of other part of left foot with fat layer exposed: Secondary | ICD-10-CM

## 2023-10-06 NOTE — Progress Notes (Signed)
  Subjective:  Patient ID: [name], male    DOB: 07-21-1965,  MRN: [id number]   DOS: 07/31/2023 Procedure: 1.  I&D with resection  fifth metatarsal, left foot 2.  Application dissolvable antibiotic calcium  sulfate beads, left foot 3.  Application of acellular dermal graft 38 cm left foot 4.  Complex wound closure, 6 x 1 x 1.5 cm, left foot  58 y.o. male seen for post op check.  Now approximately 10 weeks status post above procedures.   He reports he is doing all right he has been doing wound care with collagen dressing and cleansing the wound with iodine.  He has an upcoming appointment with Dr. Julio Ohm for evaluation for amputation versus salvage attempt.  He is very discouraged over the length of time which he has had a wound on the left foot.  Review of Systems: Negative except as noted in the HPI. Denies N/V/F/Ch.   Objective:   There were no vitals filed for this visit.  There is no height or weight on file to calculate BMI. Constitutional Well developed. Well nourished.  Vascular Foot warm and well perfused. Capillary refill normal to all digits.   No calf pain with palpation  Neurologic Normal speech. Oriented to person, place, and time. Epicritic sensation absent to left midfoot  Dermatologic Incision line lateral midfoot appears to be healing and over the majority of the wound.  There is a proximal area of dehiscence that probes to subcutaneous fat tissue and graft tissue.  Wound bed appears healthy with granular tissue and bleeds well upon debridement.  No malodor or active drainage noted no erythema surrounding the tissue margins  Overall appears to be improved from prior slightly smaller approximately 1 x 0.3 cm       Orthopedic: Varus foot deformity related to CMT   Radiographs: Expected postoperative changes with resection of fifth metatarsal and antibiotic beads  Pathology:  A. TOE, LEFT FOOT FIFTH METATARSAL BASE, AMPUTATION:       Acute and chronic  osteomyelitis.       Bone with marked reactive changes.       Soft tissue with acute and chronic inflammation, granulation tissue  and necrosis.   Micro: E. Cloacae, C Striatum  Assessment:   Osteomyelitis fifth met base status post resection with antibiotic beads graft and wound closure  Plan:  Patient was evaluated and treated and all questions answered.  10 weeks s/p above procedures - Recommend continued local wound care versus more aggressive intervention to include proximal and amputation. -Patient has upcoming appoint with Dr. Julio Ohm for evaluation for amputation versus salvage  -Continue wound care with saline wet-to-dry dressing with collagen implanted loosely into the wound.  -Change daily to every other day -XR: Deferred today -WB Status: Nonweightbearing in soft dressing and postop shoe -Medications/ABX: Abx per ID - Patient to begin seeing PT, this had to be rescheduled and he has not yet started physical therapy - Patient will follow-up as needed pending discussion with Dr. Julio Ohm, he will call our office to arrange as needed          Maridee Shoemaker, DPM [facility] / [facility]

## 2023-10-08 ENCOUNTER — Other Ambulatory Visit (INDEPENDENT_AMBULATORY_CARE_PROVIDER_SITE_OTHER): Payer: Self-pay

## 2023-10-08 ENCOUNTER — Ambulatory Visit: Admitting: Orthopedic Surgery

## 2023-10-08 ENCOUNTER — Encounter: Payer: Self-pay | Admitting: Orthopedic Surgery

## 2023-10-08 DIAGNOSIS — M86172 Other acute osteomyelitis, left ankle and foot: Secondary | ICD-10-CM

## 2023-10-08 DIAGNOSIS — M25562 Pain in left knee: Secondary | ICD-10-CM | POA: Diagnosis not present

## 2023-10-08 NOTE — Progress Notes (Signed)
 Office Visit Note   Patient: [name]           Date of Birth: 26-Dec-1965           MRN: [id number] Visit Date: 10/08/2023              Requested by: Charity Conch, DPM [address] [place],  [place] [address] PCP: Mariella Shore, MD  Chief Complaint  Patient presents with   Left Foot - Wound Check      HPI: Patient is a 58 year old gentleman who is seen for initial evaluation for osteomyelitis left hindfoot.  Patient is status post irrigation debridement of the left foot with fifth and partial fourth ray amputations.  Patient states he has undergone hyperbaric treatments as well as IV antibiotics through a PICC line.  Patient states that the ulcers in his foot started October 2019.  Assessment & Plan: Visit Diagnoses:  1. Acute pain of left knee   2. Acute osteomyelitis of left foot (HCC)     Plan: With the osteomyelitis of the cuboid and the open wound that probes to bone have recommended proceeding with a transtibial amputation.  Risk and benefits were discussed including risk of the wound not healing need for additional surgery.  Patient states he understands wished to proceed at this time.  Follow-Up Instructions: Return in about 4 weeks (around 11/05/2023).   Ortho Exam  Patient is alert, oriented, no adenopathy, well-dressed, normal affect, normal respiratory effort. Examination patient is a good dorsalis pedis pulse.  Radiographs do show calcified arteries and radiographs shows destructive bony changes of the cuboid.  The lateral foot ulcer probes to bone.  Most recent hemoglobin A1c is 10.9.  A1c consistently above 10 since 2019.  Hemoglobin stable at 14.6.  Imaging: XR Knee 1-2 Views Left Result Date: 10/08/2023 2 view radiographs of the right knee shows no evidence of a tibial plateau fracture.  No images are attached to the encounter.  Labs: Lab Results  Component Value Date   HGBA1C 10.9 (H) 07/30/2023   HGBA1C 10.7 (H)  03/27/2018   HGBA1C 11.6 (H) 07/15/2017   ESRSEDRATE 4 08/06/2021   ESRSEDRATE 6 01/08/2021   ESRSEDRATE 40 (H) 03/26/2018   CRP 6 08/06/2021   CRP 4.9 01/08/2021   REPTSTATUS 08/08/2023 FINAL 08/03/2023   GRAMSTAIN  07/31/2023    RARE WBC PRESENT, PREDOMINANTLY PMN RARE GRAM POSITIVE COCCI IN PAIRS    CULT  08/03/2023    NO GROWTH 5 DAYS Performed at [facility], [address] [place], [place] [address]    LABORGA ENTEROBACTER CLOACAE 07/31/2023     Lab Results  Component Value Date   ALBUMIN 4.2 09/22/2023   ALBUMIN 4.3 07/29/2023   ALBUMIN 4.1 02/05/2021    Lab Results  Component Value Date   MG 2.2 07/31/2023   MG 1.7 07/30/2023   MG 1.9 07/29/2023   No results found for: "VD25OH"  No results found for: "PREALBUMIN"    Latest Ref Rng & Units 09/22/2023    5:17 PM 09/22/2023    4:07 PM 08/06/2023    6:13 AM  CBC EXTENDED  WBC 4.0 - 10.5 K/uL  10.1  6.1   RBC 4.22 - 5.81 MIL/uL  5.20  4.26   Hemoglobin 13.0 - 17.0 g/dL 62.1  30.8  65.7   HCT 39.0 - 52.0 % 43.0  43.5  36.1   Platelets 150 - 400 K/uL  252  382  There is no height or weight on file to calculate BMI.  Orders:  Orders Placed This Encounter  Procedures   XR Knee 1-2 Views Left   No orders of the defined types were placed in this encounter.    Procedures: No procedures performed  Clinical Data: No additional findings.  ROS:  All other systems negative, except as noted in the HPI. Review of Systems  Objective: Vital Signs: There were no vitals taken for this visit.  Specialty Comments:  No specialty comments available.  PMFS History: Patient Active Problem List   Diagnosis Date Noted   Thrombophlebitis arm 08/03/2023   Hypokalemia 07/30/2023   Osteomyelitis (HCC) 07/29/2023   Foot ulcer with fat layer exposed, left (HCC) 11/15/2019   Displaced fracture of shaft of left clavicle, initial encounter for closed fracture 03/15/2019   Dupuytren's contracture 07/29/2018    PICC (peripherally inserted central catheter) in place 05/24/2018   Neuropathy 05/12/2018   Osteomyelitis of ankle or foot, acute, left (HCC) 05/05/2018   Diabetic ulcer of left midfoot associated with type 2 diabetes mellitus, limited to breakdown of skin (HCC) 12/18/2017   Chronic pain syndrome 11/03/2017   Gastroesophageal reflux disease with esophagitis without hemorrhage 09/17/2017   Esophageal dysphagia    DKA, type 2 (HCC) 07/15/2017   ADD (attention deficit disorder) 07/15/2017   DKA (diabetic ketoacidosis) (HCC) 07/15/2017   Vitamin B12 deficiency (non anemic) 08/13/2016   Adhesive capsulitis of left shoulder 01/18/2016   Trigger ring finger of right hand 10/11/2015   Primary osteoarthritis of first carpometacarpal joint of left hand 06/14/2015   Radial styloid tenosynovitis 06/14/2015   Depression 10/05/2014   Noncompliance with diabetes treatment 10/05/2014   Vitamin D deficiency 09/14/2014   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   Diabetic peripheral neuropathy associated with type 2 diabetes mellitus (HCC) 06/21/2014   Essential hypertension 06/21/2014   Erectile dysfunction associated with type 2 diabetes mellitus (HCC) 06/21/2014   Long-term current use of insulin  for diabetes mellitus (HCC) 06/21/2014   Adhesive capsulitis of right shoulder 03/24/2011   Retinal detachment 12/06/2010   Rotator cuff syndrome 12/06/2010   History of pancreatitis 10/22/2010   Hypertriglyceridemia 10/22/2010   Diabetes mellitus type II, uncontrolled 10/22/2010   Past Medical History:  Diagnosis Date   ADHD (attention deficit hyperactivity disorder)    Anxiety and depression    Candida esophagitis (HCC)    in setting of DKA/hyperglycemia   CIDP (chronic inflammatory demyelinating polyneuropathy) (HCC)    Depression    Diabetes type 2, controlled (HCC)    Diverticulosis    Essential hypertension 06/21/2014   Hyperlipidemia    Kidney stones    Pancreatitis    Stomach ulcer     Vitamin D deficiency     Family History  Problem Relation Age of Onset   Stroke Father    Breast cancer Mother    Colon cancer Neg Hx     Past Surgical History:  Procedure Laterality Date   ANKLE SURGERY     CERVICAL DISC SURGERY     C4-C5   ELBOW SURGERY     ESOPHAGOGASTRODUODENOSCOPY N/A 07/16/2017   Procedure: ESOPHAGOGASTRODUODENOSCOPY (EGD);  Surgeon: Kenney Peacemaker, MD;  Location: [facility] ENDOSCOPY;  Service: Endoscopy;  Laterality: N/A;   IRRIGATION AND DEBRIDEMENT FOOT Left 07/31/2023   Procedure: IRRIGATION AND DEBRIDEMENT FOOT AND FIFTH MET RESCTION;  Surgeon: Evertt Hoe, DPM;  Location: [facility] OR;  Service: Orthopedics/Podiatry;  Laterality: Left;   RETINAL DETACHMENT SURGERY     RHINOPLASTY  VASECTOMY     WOUND DEBRIDEMENT Left 03/27/2018   Procedure: DEBRIDEMENT WOUND;  Surgeon: Charity Conch, DPM;  Location: [facility] OR;  Service: Podiatry;  Laterality: Left;   Social History   Occupational History   Occupation: Market researcher:  [employer]  Tobacco Use   Smoking status: Former   Smokeless tobacco: Former  Building services engineer status: Never Used  Substance and Sexual Activity   Alcohol use: Not Currently    Comment: Socially   Drug use: No   Sexual activity: Not Currently

## 2023-10-09 ENCOUNTER — Telehealth: Payer: Self-pay | Admitting: Podiatry

## 2023-10-09 NOTE — Telephone Encounter (Signed)
 Patient called to let Dr. Clydia Dart know that his surgery with Dr. Gearldean Keepers is scheduled for May 16th.

## 2023-10-11 SURGERY — AMPUTATION BELOW KNEE
Anesthesia: Choice | Site: Knee | Laterality: Left

## 2023-10-13 NOTE — Progress Notes (Signed)
 SDW CALL  Patient was given pre-op instructions over the phone. The opportunity was given for the patient to ask questions. No further questions asked. Patient verbalized understanding of instructions given.   PCP - Bridgette Campus Udom,MD Cardiologist - Denies  PPM/ICD - Denies Device Orders -  Rep Notified -   Chest x-ray - 09/22/23 EKG - 09/22/23 Stress Test - denies ECHO - 07/15/17 Cardiac Cath - denies  Sleep Study - denies CPAP -   Fasting Blood Sugar - 200-210 Checks Blood Sugar - has a freestyle Libre  Blood Thinner Instructions:na Aspirin  Instructions:na  ERAS Protcol -clears until 0430 PRE-SURGERY Ensure or G2- no  COVID TEST- na   Anesthesia review: no  Patient denies shortness of breath, fever, cough and chest pain over the phone call   WHAT DO I DO ABOUT MY DIABETES MEDICATION?   THE NIGHT BEFORE SURGERY, take 22-23 units of Lantus  insulin .     If your CBG is greater than 220 mg/dL, you may take  of your sliding scale (correction) dose of Novolog  insulin . Do not take any Novolog  insulin  if CBG is less than 220.   Check your blood sugar the morning of your surgery when you wake up and every 2 hours until you get to the Short Stay unit.  If your blood sugar is less than 70 mg/dL, you will need to treat for low blood sugar: Do not take insulin . Treat a low blood sugar (less than 70 mg/dL) with  cup of clear juice (cranberry or apple), 4 glucose tablets, OR glucose gel. Recheck blood sugar in 15 minutes after treatment (to make sure it is greater than 70 mg/dL). If your blood sugar is not greater than 70 mg/dL on recheck, call [phone] for further instructions. Report your blood sugar to the short stay nurse when you get to Short Stay.  Oral Hygiene is also important to reduce your risk of infection.  Remember - BRUSH YOUR TEETH THE MORNING OF SURGERY WITH YOUR REGULAR TOOTHPASTE

## 2023-10-14 ENCOUNTER — Encounter (HOSPITAL_COMMUNITY): Payer: Self-pay | Admitting: Orthopedic Surgery

## 2023-10-14 ENCOUNTER — Other Ambulatory Visit: Payer: Self-pay

## 2023-10-15 NOTE — Anesthesia Preprocedure Evaluation (Signed)
 Anesthesia Evaluation  Patient identified by MRN, date of birth, ID band Patient awake    Reviewed: Allergy & Precautions, NPO status , Patient's Chart, lab work & pertinent test results  History of Anesthesia Complications Negative for: history of anesthetic complications  Airway Mallampati: II  TM Distance: >3 FB Neck ROM: Full    Dental  (+) Dental Advisory Given, Missing, Chipped, Poor Dentition, Partial Upper,    Pulmonary neg sleep apnea, neg recent URI, former smoker   Pulmonary exam normal breath sounds clear to auscultation       Cardiovascular hypertension,  Rhythm:Regular Rate:Normal     Neuro/Psych  PSYCHIATRIC DISORDERS Anxiety Depression    negative neurological ROS     GI/Hepatic Neg liver ROS, PUD,neg GERD  ,,  Endo/Other  diabetes, Type 2, Insulin  Dependent    Renal/GU Renal disease     Musculoskeletal  (+) Arthritis ,  osteomyelitis left foot   Abdominal   Peds  Hematology negative hematology ROS (+)   Anesthesia Other Findings Day of surgery medications reviewed with patient.  Reproductive/Obstetrics                             Anesthesia Physical Anesthesia Plan  ASA: 3  Anesthesia Plan: Regional   Post-op Pain Management: Tylenol  PO (pre-op)* and Regional block*   Induction:   PONV Risk Score and Plan: 2 and Ondansetron , Propofol  infusion, TIVA and Treatment may vary due to age or medical condition  Airway Management Planned: Natural Airway  Additional Equipment: None  Intra-op Plan:   Post-operative Plan:   Informed Consent: I have reviewed the patients History and Physical, chart, labs and discussed the procedure including the risks, benefits and alternatives for the proposed anesthesia with the patient or authorized representative who has indicated his/her understanding and acceptance.     Dental advisory given  Plan Discussed with:  CRNA  Anesthesia Plan Comments:         Anesthesia Quick Evaluation

## 2023-10-16 ENCOUNTER — Other Ambulatory Visit: Payer: Self-pay

## 2023-10-16 ENCOUNTER — Encounter (HOSPITAL_COMMUNITY)
Admission: RE | Disposition: A | Discharge status: Rehabilitation Facility | Payer: Self-pay | Source: Home / Self Care | Attending: Orthopedic Surgery

## 2023-10-16 ENCOUNTER — Encounter (HOSPITAL_COMMUNITY): Payer: Self-pay | Admitting: Orthopedic Surgery

## 2023-10-16 ENCOUNTER — Inpatient Hospital Stay (HOSPITAL_COMMUNITY): Payer: Self-pay | Admitting: Anesthesiology

## 2023-10-16 ENCOUNTER — Inpatient Hospital Stay (HOSPITAL_COMMUNITY)
Admission: RE | Admit: 2023-10-16 | Discharge: 2023-10-20 | DRG: 617 | Disposition: A | Discharge status: Rehabilitation Facility | Attending: Orthopedic Surgery | Admitting: Orthopedic Surgery

## 2023-10-16 ENCOUNTER — Inpatient Hospital Stay (HOSPITAL_COMMUNITY): Admit: 2023-10-16 | Admitting: Orthopedic Surgery

## 2023-10-16 DIAGNOSIS — I10 Essential (primary) hypertension: Secondary | ICD-10-CM

## 2023-10-16 DIAGNOSIS — Z87891 Personal history of nicotine dependence: Secondary | ICD-10-CM | POA: Diagnosis not present

## 2023-10-16 DIAGNOSIS — R059 Cough, unspecified: Secondary | ICD-10-CM | POA: Diagnosis present

## 2023-10-16 DIAGNOSIS — E1169 Type 2 diabetes mellitus with other specified complication: Principal | ICD-10-CM | POA: Diagnosis present

## 2023-10-16 DIAGNOSIS — G6181 Chronic inflammatory demyelinating polyneuritis: Secondary | ICD-10-CM | POA: Diagnosis present

## 2023-10-16 DIAGNOSIS — G546 Phantom limb syndrome with pain: Secondary | ICD-10-CM | POA: Diagnosis not present

## 2023-10-16 DIAGNOSIS — E785 Hyperlipidemia, unspecified: Secondary | ICD-10-CM | POA: Diagnosis present

## 2023-10-16 DIAGNOSIS — E1151 Type 2 diabetes mellitus with diabetic peripheral angiopathy without gangrene: Secondary | ICD-10-CM | POA: Diagnosis present

## 2023-10-16 DIAGNOSIS — K5900 Constipation, unspecified: Secondary | ICD-10-CM | POA: Diagnosis present

## 2023-10-16 DIAGNOSIS — K5901 Slow transit constipation: Secondary | ICD-10-CM | POA: Diagnosis not present

## 2023-10-16 DIAGNOSIS — Z87442 Personal history of urinary calculi: Secondary | ICD-10-CM | POA: Diagnosis not present

## 2023-10-16 DIAGNOSIS — M86272 Subacute osteomyelitis, left ankle and foot: Secondary | ICD-10-CM | POA: Diagnosis present

## 2023-10-16 DIAGNOSIS — G600 Hereditary motor and sensory neuropathy: Secondary | ICD-10-CM | POA: Diagnosis present

## 2023-10-16 DIAGNOSIS — E11621 Type 2 diabetes mellitus with foot ulcer: Secondary | ICD-10-CM | POA: Diagnosis present

## 2023-10-16 DIAGNOSIS — L97529 Non-pressure chronic ulcer of other part of left foot with unspecified severity: Secondary | ICD-10-CM | POA: Diagnosis present

## 2023-10-16 DIAGNOSIS — I70202 Unspecified atherosclerosis of native arteries of extremities, left leg: Secondary | ICD-10-CM | POA: Diagnosis present

## 2023-10-16 DIAGNOSIS — F411 Generalized anxiety disorder: Secondary | ICD-10-CM | POA: Diagnosis present

## 2023-10-16 DIAGNOSIS — Z803 Family history of malignant neoplasm of breast: Secondary | ICD-10-CM

## 2023-10-16 DIAGNOSIS — Z79899 Other long term (current) drug therapy: Secondary | ICD-10-CM

## 2023-10-16 DIAGNOSIS — Z888 Allergy status to other drugs, medicaments and biological substances status: Secondary | ICD-10-CM

## 2023-10-16 DIAGNOSIS — Z8711 Personal history of peptic ulcer disease: Secondary | ICD-10-CM

## 2023-10-16 DIAGNOSIS — F3181 Bipolar II disorder: Secondary | ICD-10-CM | POA: Diagnosis present

## 2023-10-16 DIAGNOSIS — Z823 Family history of stroke: Secondary | ICD-10-CM

## 2023-10-16 DIAGNOSIS — Z794 Long term (current) use of insulin: Secondary | ICD-10-CM | POA: Diagnosis not present

## 2023-10-16 DIAGNOSIS — F909 Attention-deficit hyperactivity disorder, unspecified type: Secondary | ICD-10-CM | POA: Diagnosis present

## 2023-10-16 DIAGNOSIS — R509 Fever, unspecified: Secondary | ICD-10-CM | POA: Diagnosis present

## 2023-10-16 DIAGNOSIS — Z89512 Acquired absence of left leg below knee: Secondary | ICD-10-CM | POA: Diagnosis not present

## 2023-10-16 DIAGNOSIS — Z01818 Encounter for other preprocedural examination: Principal | ICD-10-CM

## 2023-10-16 DIAGNOSIS — M869 Osteomyelitis, unspecified: Secondary | ICD-10-CM | POA: Diagnosis present

## 2023-10-16 HISTORY — PX: AMPUTATION: SHX166

## 2023-10-16 HISTORY — PX: APPLICATION OF WOUND VAC: SHX5189

## 2023-10-16 LAB — COMPREHENSIVE METABOLIC PANEL WITH GFR
ALT: 20 U/L (ref 0–44)
AST: 24 U/L (ref 15–41)
Albumin: 3.8 g/dL (ref 3.5–5.0)
Alkaline Phosphatase: 80 U/L (ref 38–126)
Anion gap: 9 (ref 5–15)
BUN: 11 mg/dL (ref 6–20)
CO2: 27 mmol/L (ref 22–32)
Calcium: 9.3 mg/dL (ref 8.9–10.3)
Chloride: 103 mmol/L (ref 98–111)
Creatinine, Ser: 1.19 mg/dL (ref 0.61–1.24)
GFR, Estimated: >60 mL/min (ref >60)
Glucose, Bld: 200 mg/dL — ABNORMAL HIGH (ref 70–99)
Potassium: 3.9 mmol/L (ref 3.5–5.1)
Sodium: 139 mmol/L (ref 135–145)
Total Bilirubin: 0.8 mg/dL (ref 0.0–1.2)
Total Protein: 6.9 g/dL (ref 6.5–8.1)

## 2023-10-16 LAB — CBC WITH DIFFERENTIAL/PLATELET
Abs Immature Granulocytes: 0.01 10*3/uL (ref 0.00–0.07)
Basophils Absolute: 0.1 10*3/uL (ref 0.0–0.1)
Basophils Relative: 1 %
Eosinophils Absolute: 0.3 10*3/uL (ref 0.0–0.5)
Eosinophils Relative: 5 %
HCT: 37.8 % — ABNORMAL LOW (ref 39.0–52.0)
Hemoglobin: 12.8 g/dL — ABNORMAL LOW (ref 13.0–17.0)
Immature Granulocytes: 0 %
Lymphocytes Relative: 39 %
Lymphs Abs: 2.6 10*3/uL (ref 0.7–4.0)
MCH: 29.2 pg (ref 26.0–34.0)
MCHC: 33.9 g/dL (ref 30.0–36.0)
MCV: 86.3 fL (ref 80.0–100.0)
Monocytes Absolute: 0.5 10*3/uL (ref 0.1–1.0)
Monocytes Relative: 7 %
Neutro Abs: 3.2 10*3/uL (ref 1.7–7.7)
Neutrophils Relative %: 48 %
Platelets: 193 10*3/uL (ref 150–400)
RBC: 4.38 MIL/uL (ref 4.22–5.81)
RDW: 13.5 % (ref 11.5–15.5)
WBC: 6.6 10*3/uL (ref 4.0–10.5)
nRBC: 0 % (ref 0.0–0.2)

## 2023-10-16 LAB — GLUCOSE, CAPILLARY
Glucose-Capillary: 188 mg/dL — ABNORMAL HIGH (ref 70–99)
Glucose-Capillary: 178 mg/dL — ABNORMAL HIGH (ref 70–99)
Glucose-Capillary: 195 mg/dL — ABNORMAL HIGH (ref 70–99)
Glucose-Capillary: 223 mg/dL — ABNORMAL HIGH (ref 70–99)
Glucose-Capillary: 225 mg/dL — ABNORMAL HIGH (ref 70–99)

## 2023-10-16 LAB — HEMOGLOBIN A1C
Hgb A1c MFr Bld: 7.7 % — ABNORMAL HIGH (ref 4.8–5.6)
Mean Plasma Glucose: 174.29 mg/dL

## 2023-10-16 LAB — PREALBUMIN: Prealbumin: 22 mg/dL (ref 18–38)

## 2023-10-16 SURGERY — AMPUTATION BELOW KNEE
Anesthesia: Regional | Site: Leg Lower | Laterality: Left

## 2023-10-16 MED ORDER — ALUM & MAG HYDROXIDE-SIMETH 200-200-20 MG/5ML PO SUSP: 15.0000 mL | ORAL | Status: DC | PRN

## 2023-10-16 MED ORDER — FENTANYL CITRATE (PF) 250 MCG/5ML IJ SOLN
INTRAMUSCULAR | Status: AC
  Filled 2023-10-16: qty 5

## 2023-10-16 MED ORDER — DROPERIDOL 2.5 MG/ML IJ SOLN: 0.6250 mg | Freq: Once | INTRAMUSCULAR | Status: DC | PRN

## 2023-10-16 MED ORDER — LIDOCAINE 2% (20 MG/ML) 5 ML SYRINGE
INTRAMUSCULAR | Status: DC | PRN
  Administered 2023-10-16: 40 mg via INTRAVENOUS

## 2023-10-16 MED ORDER — FENOFIBRATE 160 MG PO TABS
160.0000 mg | ORAL_TABLET | Freq: Every day | ORAL | Status: DC
  Administered 2023-10-17 – 2023-10-20 (×4): 160 mg via ORAL
  Filled 2023-10-16 (×4): qty 1

## 2023-10-16 MED ORDER — VENLAFAXINE HCL ER 37.5 MG PO CP24
37.5000 mg | ORAL_CAPSULE | Freq: Every day | ORAL | Status: DC
  Filled 2023-10-16: qty 1

## 2023-10-16 MED ORDER — HYDRALAZINE HCL 20 MG/ML IJ SOLN: 5.0000 mg | INTRAMUSCULAR | Status: DC | PRN

## 2023-10-16 MED ORDER — LIDOCAINE 2% (20 MG/ML) 5 ML SYRINGE
INTRAMUSCULAR | Status: AC
  Filled 2023-10-16: qty 5

## 2023-10-16 MED ORDER — TRANEXAMIC ACID 1000 MG/10ML IV SOLN
2000.0000 mg | INTRAVENOUS | Status: DC
  Filled 2023-10-16: qty 20

## 2023-10-16 MED ORDER — POLYETHYLENE GLYCOL 3350 17 G PO PACK
17.0000 g | PACK | Freq: Every day | ORAL | Status: DC | PRN
  Administered 2023-10-19: 17 g via ORAL
  Filled 2023-10-16 (×2): qty 1

## 2023-10-16 MED ORDER — CHLORHEXIDINE GLUCONATE 0.12 % MT SOLN
15.0000 mL | Freq: Once | OROMUCOSAL | Status: AC
  Administered 2023-10-16: 15 mL via OROMUCOSAL
  Filled 2023-10-16: qty 15

## 2023-10-16 MED ORDER — GABAPENTIN 400 MG PO CAPS
800.0000 mg | ORAL_CAPSULE | Freq: Three times a day (TID) | ORAL | Status: DC
  Administered 2023-10-16 – 2023-10-20 (×14): 800 mg via ORAL
  Filled 2023-10-16 (×15): qty 2

## 2023-10-16 MED ORDER — 0.9 % SODIUM CHLORIDE (POUR BTL) OPTIME
TOPICAL | Status: DC | PRN
Start: 2023-10-16 — End: 2023-10-16
  Administered 2023-10-16: 1000 mL

## 2023-10-16 MED ORDER — INSULIN ASPART 100 UNIT/ML IJ SOLN
0.0000 [IU] | Freq: Three times a day (TID) | INTRAMUSCULAR | Status: DC
  Administered 2023-10-16: 3 [IU] via SUBCUTANEOUS
  Administered 2023-10-16 – 2023-10-17 (×2): 5 [IU] via SUBCUTANEOUS
  Administered 2023-10-17 – 2023-10-18 (×3): 3 [IU] via SUBCUTANEOUS
  Administered 2023-10-18: 2 [IU] via SUBCUTANEOUS
  Administered 2023-10-18 – 2023-10-19 (×2): 3 [IU] via SUBCUTANEOUS
  Administered 2023-10-19: 2 [IU] via SUBCUTANEOUS
  Administered 2023-10-19: 5 [IU] via SUBCUTANEOUS
  Administered 2023-10-20: 3 [IU] via SUBCUTANEOUS
  Administered 2023-10-20 (×2): 5 [IU] via SUBCUTANEOUS

## 2023-10-16 MED ORDER — MIDAZOLAM HCL 2 MG/2ML IJ SOLN
INTRAMUSCULAR | Status: AC
  Filled 2023-10-16: qty 2

## 2023-10-16 MED ORDER — ONDANSETRON HCL 4 MG/2ML IJ SOLN
4.0000 mg | Freq: Four times a day (QID) | INTRAMUSCULAR | Status: DC | PRN
  Administered 2023-10-18: 4 mg via INTRAVENOUS
  Filled 2023-10-16: qty 2

## 2023-10-16 MED ORDER — TRAZODONE HCL 50 MG PO TABS
50.0000 mg | ORAL_TABLET | Freq: Every evening | ORAL | Status: DC | PRN
  Administered 2023-10-19: 50 mg via ORAL
  Filled 2023-10-16: qty 1

## 2023-10-16 MED ORDER — CEFAZOLIN SODIUM-DEXTROSE 2-4 GM/100ML-% IV SOLN
2.0000 g | INTRAVENOUS | Status: AC
  Administered 2023-10-16: 2 g via INTRAVENOUS
  Filled 2023-10-16: qty 100

## 2023-10-16 MED ORDER — INSULIN GLARGINE-YFGN 100 UNIT/ML ~~LOC~~ SOLN
40.0000 [IU] | Freq: Every day | SUBCUTANEOUS | Status: DC
  Administered 2023-10-16 – 2023-10-20 (×5): 40 [IU] via SUBCUTANEOUS
  Filled 2023-10-16 (×5): qty 0.4

## 2023-10-16 MED ORDER — POTASSIUM CHLORIDE CRYS ER 20 MEQ PO TBCR: 20.0000 meq | EXTENDED_RELEASE_TABLET | Freq: Every day | ORAL | Status: DC | PRN

## 2023-10-16 MED ORDER — FENTANYL CITRATE (PF) 100 MCG/2ML IJ SOLN: 25.0000 ug | INTRAMUSCULAR | Status: DC | PRN

## 2023-10-16 MED ORDER — AMPHETAMINE-DEXTROAMPHETAMINE 10 MG PO TABS
30.0000 mg | ORAL_TABLET | Freq: Two times a day (BID) | ORAL | Status: DC
  Administered 2023-10-17 – 2023-10-20 (×8): 30 mg via ORAL
  Filled 2023-10-16 (×8): qty 3

## 2023-10-16 MED ORDER — PROPOFOL 1000 MG/100ML IV EMUL
INTRAVENOUS | Status: AC
  Filled 2023-10-16: qty 100

## 2023-10-16 MED ORDER — CEFAZOLIN SODIUM-DEXTROSE 2-4 GM/100ML-% IV SOLN
2.0000 g | Freq: Three times a day (TID) | INTRAVENOUS | Status: AC
  Administered 2023-10-16 – 2023-10-17 (×2): 2 g via INTRAVENOUS
  Filled 2023-10-16 (×2): qty 100

## 2023-10-16 MED ORDER — TRANEXAMIC ACID-NACL 1000-0.7 MG/100ML-% IV SOLN
1000.0000 mg | INTRAVENOUS | Status: AC
  Administered 2023-10-16: 1000 mg via INTRAVENOUS
  Filled 2023-10-16: qty 100

## 2023-10-16 MED ORDER — ATORVASTATIN CALCIUM 10 MG PO TABS
10.0000 mg | ORAL_TABLET | Freq: Every day | ORAL | Status: DC
  Administered 2023-10-17 – 2023-10-20 (×4): 10 mg via ORAL
  Filled 2023-10-16 (×4): qty 1

## 2023-10-16 MED ORDER — OXYCODONE HCL 5 MG PO TABS
10.0000 mg | ORAL_TABLET | ORAL | Status: DC | PRN
  Administered 2023-10-16 – 2023-10-20 (×9): 10 mg via ORAL
  Filled 2023-10-16 (×7): qty 2

## 2023-10-16 MED ORDER — JUVEN PO PACK
1.0000 | PACK | Freq: Two times a day (BID) | ORAL | Status: DC
  Administered 2023-10-16 – 2023-10-20 (×9): 1 via ORAL
  Filled 2023-10-16 (×9): qty 1

## 2023-10-16 MED ORDER — DOCUSATE SODIUM 100 MG PO CAPS
100.0000 mg | ORAL_CAPSULE | Freq: Every day | ORAL | Status: DC
  Administered 2023-10-17 – 2023-10-20 (×4): 100 mg via ORAL
  Filled 2023-10-16 (×4): qty 1

## 2023-10-16 MED ORDER — FENTANYL CITRATE (PF) 100 MCG/2ML IJ SOLN: INTRAMUSCULAR | Status: DC | PRN

## 2023-10-16 MED ORDER — MAGNESIUM SULFATE 2 GM/50ML IV SOLN: 2.0000 g | Freq: Every day | INTRAVENOUS | Status: DC | PRN

## 2023-10-16 MED ORDER — SODIUM CHLORIDE 0.9 % IV SOLN: INTRAVENOUS | Status: DC

## 2023-10-16 MED ORDER — PHENOL 1.4 % MT LIQD: 1.0000 | OROMUCOSAL | Status: DC | PRN

## 2023-10-16 MED ORDER — RAMIPRIL 1.25 MG PO CAPS: 2.5000 mg | ORAL_CAPSULE | Freq: Every day | ORAL | Status: DC

## 2023-10-16 MED ORDER — LITHIUM CARBONATE 150 MG PO CAPS
150.0000 mg | ORAL_CAPSULE | Freq: Every day | ORAL | Status: DC
  Administered 2023-10-16 – 2023-10-20 (×5): 150 mg via ORAL
  Filled 2023-10-16 (×5): qty 1

## 2023-10-16 MED ORDER — FENTANYL CITRATE (PF) 250 MCG/5ML IJ SOLN
INTRAMUSCULAR | Status: DC | PRN
  Administered 2023-10-16: 100 ug via INTRAVENOUS

## 2023-10-16 MED ORDER — GUAIFENESIN-DM 100-10 MG/5ML PO SYRP
15.0000 mL | ORAL_SOLUTION | ORAL | Status: DC | PRN
  Administered 2023-10-16 – 2023-10-19 (×4): 15 mL via ORAL
  Filled 2023-10-16 (×4): qty 15

## 2023-10-16 MED ORDER — MAGNESIUM CITRATE PO SOLN: 1.0000 | Freq: Once | ORAL | Status: DC | PRN

## 2023-10-16 MED ORDER — VENLAFAXINE HCL ER 75 MG PO CP24
75.0000 mg | ORAL_CAPSULE | Freq: Every day | ORAL | Status: DC
  Administered 2023-10-16 – 2023-10-20 (×5): 75 mg via ORAL
  Filled 2023-10-16 (×5): qty 1

## 2023-10-16 MED ORDER — MIDAZOLAM HCL 2 MG/2ML IJ SOLN
INTRAMUSCULAR | Status: DC | PRN
  Administered 2023-10-16: 2 mg via INTRAVENOUS

## 2023-10-16 MED ORDER — VASHE WOUND IRRIGATION OPTIME
TOPICAL | Status: DC | PRN
  Administered 2023-10-16: 34 [oz_av]

## 2023-10-16 MED ORDER — PROPOFOL 10 MG/ML IV BOLUS
INTRAVENOUS | Status: AC
  Filled 2023-10-16: qty 20

## 2023-10-16 MED ORDER — INSULIN ASPART 100 UNIT/ML IJ SOLN
0.0000 [IU] | INTRAMUSCULAR | Status: DC | PRN
  Administered 2023-10-16: 4 [IU] via SUBCUTANEOUS
  Filled 2023-10-16: qty 1

## 2023-10-16 MED ORDER — LABETALOL HCL 5 MG/ML IV SOLN: 10.0000 mg | INTRAVENOUS | Status: DC | PRN

## 2023-10-16 MED ORDER — CLONAZEPAM 0.5 MG PO TABS
1.0000 mg | ORAL_TABLET | Freq: Every day | ORAL | Status: DC
  Administered 2023-10-16 – 2023-10-20 (×5): 1 mg via ORAL
  Filled 2023-10-16 (×5): qty 2

## 2023-10-16 MED ORDER — METOPROLOL TARTRATE 5 MG/5ML IV SOLN: 2.0000 mg | INTRAVENOUS | Status: DC | PRN

## 2023-10-16 MED ORDER — PROPOFOL 500 MG/50ML IV EMUL
INTRAVENOUS | Status: DC | PRN
  Administered 2023-10-16: 100 ug/kg/min via INTRAVENOUS

## 2023-10-16 MED ORDER — CEFAZOLIN SODIUM-DEXTROSE 2-4 GM/100ML-% IV SOLN
INTRAVENOUS | Status: AC
  Filled 2023-10-16: qty 100

## 2023-10-16 MED ORDER — LACTATED RINGERS IV SOLN: INTRAVENOUS | Status: DC

## 2023-10-16 MED ORDER — INSULIN ASPART 100 UNIT/ML IJ SOLN
4.0000 [IU] | Freq: Three times a day (TID) | INTRAMUSCULAR | Status: DC
  Administered 2023-10-16 – 2023-10-20 (×13): 4 [IU] via SUBCUTANEOUS

## 2023-10-16 MED ORDER — BISACODYL 5 MG PO TBEC
5.0000 mg | DELAYED_RELEASE_TABLET | Freq: Every day | ORAL | Status: DC | PRN
  Administered 2023-10-20: 5 mg via ORAL
  Filled 2023-10-16: qty 1

## 2023-10-16 MED ORDER — GABAPENTIN 300 MG PO CAPS
300.0000 mg | ORAL_CAPSULE | Freq: Once | ORAL | Status: DC
  Filled 2023-10-16: qty 1

## 2023-10-16 MED ORDER — PANTOPRAZOLE SODIUM 40 MG PO TBEC
40.0000 mg | DELAYED_RELEASE_TABLET | Freq: Every day | ORAL | Status: DC
  Administered 2023-10-17 – 2023-10-20 (×5): 40 mg via ORAL
  Filled 2023-10-16 (×4): qty 1

## 2023-10-16 MED ORDER — LUMATEPERONE TOSYLATE 42 MG PO CAPS
42.0000 mg | ORAL_CAPSULE | Freq: Every day | ORAL | Status: DC
  Administered 2023-10-17 – 2023-10-20 (×5): 42 mg via ORAL
  Filled 2023-10-16 (×5): qty 1

## 2023-10-16 MED ORDER — BUPIVACAINE HCL (PF) 0.5 % IJ SOLN
INTRAMUSCULAR | Status: DC | PRN
  Administered 2023-10-16: 11 mL via PERINEURAL
  Administered 2023-10-16: 19 mL via PERINEURAL

## 2023-10-16 MED ORDER — OXYCODONE HCL 5 MG PO TABS
5.0000 mg | ORAL_TABLET | ORAL | Status: DC | PRN
Start: 2023-10-16 — End: 2023-10-20
  Administered 2023-10-18 – 2023-10-20 (×2): 10 mg via ORAL
  Filled 2023-10-16 (×5): qty 2

## 2023-10-16 MED ORDER — VITAMIN C 500 MG PO TABS
1000.0000 mg | ORAL_TABLET | Freq: Every day | ORAL | Status: DC
  Administered 2023-10-17 – 2023-10-20 (×4): 1000 mg via ORAL
  Filled 2023-10-16 (×4): qty 2

## 2023-10-16 MED ORDER — ORAL CARE MOUTH RINSE: 15.0000 mL | Freq: Once | OROMUCOSAL | Status: AC

## 2023-10-16 MED ORDER — BUPIVACAINE LIPOSOME 1.3 % IJ SUSP
INTRAMUSCULAR | Status: DC | PRN
Start: 2023-10-16 — End: 2023-10-16
  Administered 2023-10-16: 4 mL via PERINEURAL
  Administered 2023-10-16: 6 mL via PERINEURAL

## 2023-10-16 MED ORDER — ZINC SULFATE 220 (50 ZN) MG PO CAPS
220.0000 mg | ORAL_CAPSULE | Freq: Every day | ORAL | Status: DC
  Administered 2023-10-17 – 2023-10-20 (×5): 220 mg via ORAL
  Filled 2023-10-16 (×4): qty 1

## 2023-10-16 MED ORDER — ACETAMINOPHEN 325 MG PO TABS
325.0000 mg | ORAL_TABLET | Freq: Four times a day (QID) | ORAL | Status: DC | PRN
  Administered 2023-10-17 – 2023-10-18 (×5): 650 mg via ORAL
  Administered 2023-10-19: 325 mg via ORAL
  Administered 2023-10-19 – 2023-10-20 (×3): 650 mg via ORAL
  Filled 2023-10-16 (×9): qty 2

## 2023-10-16 MED ORDER — ACETAMINOPHEN 500 MG PO TABS
1000.0000 mg | ORAL_TABLET | Freq: Once | ORAL | Status: AC
  Administered 2023-10-16: 1000 mg via ORAL
  Filled 2023-10-16: qty 2

## 2023-10-16 MED ORDER — HYDROMORPHONE HCL 1 MG/ML IJ SOLN
0.5000 mg | INTRAMUSCULAR | Status: DC | PRN
Start: 2023-10-16 — End: 2023-10-20
  Administered 2023-10-18 – 2023-10-19 (×2): 1 mg via INTRAVENOUS
  Filled 2023-10-16 (×2): qty 1

## 2023-10-16 SURGICAL SUPPLY — 34 items
BAG COUNTER SPONGE SURGICOUNT (BAG) IMPLANT
BLADE SAW RECIP 87.9 MT (BLADE) ×3 IMPLANT
BLADE SURG 21 STRL SS (BLADE) ×3 IMPLANT
BNDG COHESIVE 6X5 TAN ST LF (GAUZE/BANDAGES/DRESSINGS) IMPLANT
CANISTER WOUND CARE 500ML ATS (WOUND CARE) ×3 IMPLANT
CANISTER WOUNDNEG PRESSURE 500 (CANNISTER) IMPLANT
COVER SURGICAL LIGHT HANDLE (MISCELLANEOUS) ×3 IMPLANT
CUFF TRNQT CYL 34X4.125X (TOURNIQUET CUFF) ×3 IMPLANT
DRAPE INCISE IOBAN 66X45 STRL (DRAPES) ×3 IMPLANT
DRAPE U-SHAPE 47X51 STRL (DRAPES) ×3 IMPLANT
DRESSING PREVENA PLUS CUSTOM (GAUZE/BANDAGES/DRESSINGS) ×3 IMPLANT
DURAPREP 26ML APPLICATOR (WOUND CARE) ×3 IMPLANT
ELECTRODE REM PT RTRN 9FT ADLT (ELECTROSURGICAL) ×3 IMPLANT
GLOVE BIOGEL PI IND STRL 9 (GLOVE) ×3 IMPLANT
GLOVE SURG ORTHO 9.0 STRL STRW (GLOVE) ×3 IMPLANT
GOWN STRL REUS W/ TWL XL LVL3 (GOWN DISPOSABLE) ×6 IMPLANT
GRAFT SKIN WND MICRO 38 (Tissue) IMPLANT
GRAFT SKIN WND OMEGA3 SB 7X10 (Tissue) IMPLANT
KIT BASIN OR (CUSTOM PROCEDURE TRAY) ×3 IMPLANT
KIT TURNOVER KIT B (KITS) ×3 IMPLANT
MANIFOLD NEPTUNE II (INSTRUMENTS) ×3 IMPLANT
NS IRRIG 1000ML POUR BTL (IV SOLUTION) ×3 IMPLANT
PACK ORTHO EXTREMITY (CUSTOM PROCEDURE TRAY) ×3 IMPLANT
PAD ARMBOARD POSITIONER FOAM (MISCELLANEOUS) ×3 IMPLANT
PREVENA RESTOR ARTHOFORM 46X30 (CANNISTER) ×3 IMPLANT
SPONGE T-LAP 18X18 ~~LOC~~+RFID (SPONGE) IMPLANT
STAPLER VISISTAT 35W (STAPLE) IMPLANT
STOCKINETTE IMPERVIOUS LG (DRAPES) ×3 IMPLANT
SUT ETHILON 2 0 PSLX (SUTURE) IMPLANT
SUT SILK 2-0 18XBRD TIE 12 (SUTURE) ×3 IMPLANT
SUT VIC AB 1 CTX 27 (SUTURE) ×6 IMPLANT
TOWEL GREEN STERILE (TOWEL DISPOSABLE) ×3 IMPLANT
TUBE CONNECTING 12X1/4 (SUCTIONS) ×3 IMPLANT
YANKAUER SUCT BULB TIP NO VENT (SUCTIONS) ×3 IMPLANT

## 2023-10-16 NOTE — Anesthesia Procedure Notes (Signed)
 Anesthesia Regional Block: Adductor canal block   Pre-Anesthetic Checklist: , timeout performed,  Correct Patient, Correct Site, Correct Laterality,  Correct Procedure, Correct Position, site marked,  Risks and benefits discussed,  Surgical consent,  Pre-op evaluation,  At surgeon's request and post-op pain management  Laterality: Lower and Left  Prep: chloraprep       Needles:  Injection technique: Single-shot  Needle Type: Stimiplex     Needle Length: 9cm  Needle Gauge: 21     Additional Needles:   Procedures:,,,, ultrasound used (permanent image in chart),,    Narrative:  Start time: 10/16/2023 6:58 AM End time: 10/16/2023 7:13 AM Injection made incrementally with aspirations every 5 mL.  Performed by: Personally  Anesthesiologist: Gorman Laughter, MD  Additional Notes: BP cuff, EKG monitors applied. Sedation begun. Artery and nerve location verified with ultrasound. Anesthetic injected incrementally (5ml), slowly, and after negative aspirations under direct u/s guidance. Good fascial/perineural spread. Tolerated well.

## 2023-10-16 NOTE — Anesthesia Procedure Notes (Signed)
 Anesthesia Regional Block: Popliteal block   Pre-Anesthetic Checklist: , timeout performed,  Correct Patient, Correct Site, Correct Laterality,  Correct Procedure, Correct Position, site marked,  Risks and benefits discussed,  Surgical consent,  Pre-op evaluation,  At surgeon's request and post-op pain management  Laterality: Lower and Left  Prep: chloraprep       Needles:  Injection technique: Single-shot  Needle Type: Stimiplex     Needle Length: 10cm  Needle Gauge: 21     Additional Needles:   Procedures:,,,, ultrasound used (permanent image in chart),,   Motor weakness within 5 minutes.  Narrative:  Start time: 10/16/2023 7:13 AM End time: 10/16/2023 7:18 AM Injection made incrementally with aspirations every 5 mL.  Performed by: Personally  Anesthesiologist: Gorman Laughter, MD  Additional Notes: Nerve located and needle positioned with direct ultrasound guidance. Good perineural spread. Patient tolerated well.

## 2023-10-16 NOTE — H&P (Signed)
 [name] is an 58 y.o. male.   Chief Complaint: chronic infection left foot HPI: Patient is a 58 year old gentleman who is seen for initial evaluation for osteomyelitis left hindfoot.  Patient is status post irrigation debridement of the left foot with fifth and partial fourth ray amputations.   Patient states he has undergone hyperbaric treatments as well as IV antibiotics through a PICC line.  Patient states that the ulcers in his foot started October 2019.  Past Medical History:  Diagnosis Date   ADHD (attention deficit hyperactivity disorder)    Anxiety and depression    Candida esophagitis (HCC)    in setting of DKA/hyperglycemia   CIDP (chronic inflammatory demyelinating polyneuropathy) (HCC)    Depression    Diabetes type 2, controlled (HCC)    Diverticulosis    Essential hypertension 06/21/2014   Hyperlipidemia    Kidney stones    Pancreatitis    Stomach ulcer    Vitamin D deficiency     Past Surgical History:  Procedure Laterality Date   ANKLE SURGERY     CERVICAL DISC SURGERY     C4-C5   ELBOW SURGERY     ESOPHAGOGASTRODUODENOSCOPY N/A 07/16/2017   Procedure: ESOPHAGOGASTRODUODENOSCOPY (EGD);  Surgeon: Kenney Peacemaker, MD;  Location: [facility] ENDOSCOPY;  Service: Endoscopy;  Laterality: N/A;   IRRIGATION AND DEBRIDEMENT FOOT Left 07/31/2023   Procedure: IRRIGATION AND DEBRIDEMENT FOOT AND FIFTH MET RESCTION;  Surgeon: Evertt Hoe, DPM;  Location: [facility] OR;  Service: Orthopedics/Podiatry;  Laterality: Left;   RETINAL DETACHMENT SURGERY     RHINOPLASTY     VASECTOMY     WOUND DEBRIDEMENT Left 03/27/2018   Procedure: DEBRIDEMENT WOUND;  Surgeon: Charity Conch, DPM;  Location: [facility] OR;  Service: Podiatry;  Laterality: Left;    Family History  Problem Relation Age of Onset   Stroke Father    Breast cancer Mother    Colon cancer Neg Hx    Social History:  reports that he has quit smoking. He has quit using smokeless tobacco. He reports that he does not  currently use alcohol. He reports that he does not use drugs.  Allergies:  Allergies  Allergen Reactions   Ritalin [Methylphenidate] Palpitations and Other (See Comments)    Hyperactivity, also    Medications Prior to Admission  Medication Sig Dispense Refill   amphetamine -dextroamphetamine  (ADDERALL) 30 MG tablet Take 1 tablet by mouth 2 (two) times daily.     atorvastatin  (LIPITOR) 10 MG tablet Take 10 mg by mouth daily.     Cholecalciferol (VITAMIN D3 MAXIMUM STRENGTH) 125 MCG (5000 UT) capsule Take 5,000 Units by mouth daily.     clonazePAM (KLONOPIN) 1 MG tablet Take 1-1.5 mg by mouth at bedtime.     cyanocobalamin (VITAMIN B12) 1000 MCG tablet Take 2,000 mcg by mouth daily.     Desvenlafaxine ER 100 MG TB24 Take 100 mg by mouth daily.     fenofibrate  160 MG tablet Take 160 mg by mouth daily.     gabapentin  (NEURONTIN ) 800 MG tablet Take 800 mg by mouth 3 (three) times daily.     Glucagon  (GVOKE HYPOPEN  2-PACK) 1 MG/0.2ML SOAJ Inject 1 mg into the skin as needed for up to 2 doses (Severe low blood sugar). 0.4 mL 0   insulin  aspart (NOVOLOG ) 100 UNIT/ML FlexPen Inject 0-6 Units into the skin 3 (three) times daily with meals. Check Blood Glucose (BG) and inject per scale: BG <150= 0 unit; BG 150-200= 1 unit; BG 201-250=  2 unit; BG 251-300= 3 unit; BG 301-350= 4 unit; BG 351-400= 5 unit; BG >400= 6 unit and Call Primary Care. (Patient taking differently: Inject 8-24 Units into the skin 3 (three) times daily as needed for high blood sugar.) 15 mL 0   insulin  glargine (LANTUS ) 100 UNIT/ML Solostar Pen Inject 15 Units into the skin 2 (two) times daily. May substitute as needed per insurance. (Patient taking differently: Inject 45 Units into the skin at bedtime. May substitute as needed per insurance.) 15 mL 0   lithium carbonate 150 MG capsule Take 150 mg by mouth at bedtime.     lumateperone  tosylate (CAPLYTA ) 42 MG capsule Take 42 mg by mouth at bedtime.     Nutritional Supplements (JUVEN  PO) Take 1 Container by mouth 2 (two) times daily.     OVER THE COUNTER MEDICATION Take 1,000 mg by mouth at bedtime as needed (sleep/anxiety). CBD gummies     traZODone (DESYREL) 50 MG tablet Take 50-100 mg by mouth at bedtime as needed for sleep.     Blood Glucose Monitoring Suppl DEVI 1 each by Does not apply route 3 (three) times daily. May dispense any manufacturer covered by patient's insurance. 1 each 0   Continuous Blood Gluc Receiver (FREESTYLE LIBRE 14 DAY READER) DEVI every 14 (fourteen) days.     Continuous Blood Gluc Sensor (FREESTYLE LIBRE 14 DAY SENSOR) MISC   2   Glucose Blood (BLOOD GLUCOSE TEST STRIPS) STRP 1 each by Does not apply route 3 (three) times daily. Use as directed to check blood sugar. May dispense any manufacturer covered by patient's insurance and fits patient's device. 100 strip 0   Insulin  Pen Needle (PEN NEEDLES) 31G X 5 MM MISC 1 each by Does not apply route 3 (three) times daily. May dispense any manufacturer covered by patient's insurance. 100 each 0   Lancet Device MISC 1 each by Does not apply route 3 (three) times daily. May dispense any manufacturer covered by patient's insurance. 1 each 0   Lancets MISC 1 each by Does not apply route 3 (three) times daily. Use as directed to check blood sugar. May dispense any manufacturer covered by patient's insurance and fits patient's device. 100 each 0   ramipril  (ALTACE ) 2.5 MG capsule Take 2.5 mg by mouth daily.     TECHLITE PEN NEEDLES 31G X 8 MM MISC USE TO GIVE INSULIN  4 TIMES DAILY      Results for orders placed or performed during the hospital encounter of 10/16/23 (from the past 48 hours)  Glucose, capillary     Status: Abnormal   Collection Time: 10/16/23  5:49 AM  Result Value Ref Range   Glucose-Capillary 188 (H) 70 - 99 mg/dL    Comment: Glucose reference range applies only to samples taken after fasting for at least 8 hours.   Comment 1 Notify RN   Hemoglobin A1c     Status: Abnormal   Collection  Time: 10/16/23  6:17 AM  Result Value Ref Range   Hgb A1c MFr Bld 7.7 (H) 4.8 - 5.6 %    Comment: (NOTE) Pre diabetes:          5.7%-6.4%  Diabetes:              >6.4%  Glycemic control for   <7.0% adults with diabetes    Mean Plasma Glucose 174.29 mg/dL    Comment: Performed at [facility], [address] [place], [place] [address]  CBC WITH DIFFERENTIAL  Status: Abnormal   Collection Time: 10/16/23  6:18 AM  Result Value Ref Range   WBC 6.6 4.0 - 10.5 K/uL   RBC 4.38 4.22 - 5.81 MIL/uL   Hemoglobin 12.8 (L) 13.0 - 17.0 g/dL   HCT 16.1 (L) 09.6 - 04.5 %   MCV 86.3 80.0 - 100.0 fL   MCH 29.2 26.0 - 34.0 pg   MCHC 33.9 30.0 - 36.0 g/dL   RDW 40.9 81.1 - 91.4 %   Platelets 193 150 - 400 K/uL   nRBC 0.0 0.0 - 0.2 %   Neutrophils Relative % 48 %   Neutro Abs 3.2 1.7 - 7.7 K/uL   Lymphocytes Relative 39 %   Lymphs Abs 2.6 0.7 - 4.0 K/uL   Monocytes Relative 7 %   Monocytes Absolute 0.5 0.1 - 1.0 K/uL   Eosinophils Relative 5 %   Eosinophils Absolute 0.3 0.0 - 0.5 K/uL   Basophils Relative 1 %   Basophils Absolute 0.1 0.0 - 0.1 K/uL   Immature Granulocytes 0 %   Abs Immature Granulocytes 0.01 0.00 - 0.07 K/uL    Comment: Performed at [facility], [address], [place], [place] [address]   No results found.  Review of Systems  All other systems reviewed and are negative.   Blood pressure 137/84, pulse (!) 59, temperature (!) 97.5 F (36.4 C), temperature source Oral, resp. rate 18, height 6\' 4"  (1.93 m), weight 97.5 kg, SpO2 100%. Physical Exam  Patient is alert, oriented, no adenopathy, well-dressed, normal affect, normal respiratory effort. Examination patient is a good dorsalis pedis pulse.  Radiographs do show calcified arteries and radiographs shows destructive bony changes of the cuboid.  The lateral foot ulcer probes to bone.   Most recent hemoglobin A1c is 10.9.  A1c consistently above 10 since 2019.  Hemoglobin stable at  14.6. Assessment/Plan Assessment & Plan: Visit Diagnoses:  1.    Acute osteomyelitis of left foot (HCC)       Plan: With the osteomyelitis of the cuboid and the open wound that probes to bone have recommended proceeding with a transtibial amputation.  Risk and benefits were discussed including risk of the wound not healing need for additional surgery.  Patient states he understands wished to proceed at this time.  Timothy Ford, MD 10/16/2023, 6:39 AM

## 2023-10-16 NOTE — Progress Notes (Signed)
 Orthopedic Tech Progress Note Patient Details:  [name] 07/27/1965 [id number]  Patient ID: [name], male   DOB: Oct 27, 1965, 58 y.o.   MRN: [id number] [name] completed this order. [name] 10/16/2023, 11:15 AM

## 2023-10-16 NOTE — Op Note (Signed)
 10/16/2023  8:32 AM  PATIENT:  [name]    PRE-OPERATIVE DIAGNOSIS:  Osteomyelitis Left Foot  POST-OPERATIVE DIAGNOSIS:  Same  PROCEDURE:  LEFT BELOW KNEE AMPUTATION Application of Kerecis micro graft 38 cm and Kerecis sheet 7 x 10 cm. Application of Prevena customizable and Prevena arthroform wound VAC dressings Application of Vive Wear stump shrinker and the [employer] limb protector  Cryo therapy for the sciatic and anterior tibial nerve.  SURGEON:  Timothy Ford, MD  ANESTHESIA:   General  PREOPERATIVE INDICATIONS:  [name] is a  58 y.o. male with a diagnosis of Osteomyelitis Left Foot who failed conservative measures and elected for surgical management.    The risks benefits and alternatives were discussed with the patient preoperatively including but not limited to the risks of infection, bleeding, nerve injury, cardiopulmonary complications, the need for revision surgery, among others, and the patient was willing to proceed.  OPERATIVE IMPLANTS:   Implant Name Type Inv. Item Serial No. Manufacturer Lot No. LRB No. Used Action  GRAFT SKIN WND OMEGA3 SB 7X10 - [id number] Tissue GRAFT SKIN WND OMEGA3 SB 7X10  [employer] [id number] Left 1 Implanted  GRAFT SKIN WND MICRO 38 - [id number] Tissue GRAFT SKIN WND MICRO 38  [employer] [id number] Left 1 Implanted     OPERATIVE FINDINGS: tissue margins clear  OPERATIVE PROCEDURE: Patient was brought to the operating room after undergoing a regional anesthetic.  After adequate levels anesthesia were obtained a thigh tourniquet was placed and the lower extremity was prepped using DuraPrep draped into a sterile field. The foot was draped out of the sterile field with impervious stockinette.  A timeout was called and the tourniquet inflated.  A transverse skin incision was made 12 cm distal to the tibial tubercle, the incision curved proximally, and a large posterior flap was created.  The tibia was transected just  proximal to the skin incision and beveled anteriorly.  The fibula was transected just proximal to the tibial incision.  The sciatic nerve was pulled cut and allowed to retract.  The vascular bundles were suture ligated with 2-0 silk.  The tourniquet was deflated and hemostasis obtained.    The cryo wand was used to freeze the sciatic nerve x 2 at 2 minutes.  The anterior tibial nerve was cryo treated for 2 minutes.  The nerves were transected 1 cm distal to the cryo treatment.    The Kerecis micro powder 38 cm was applied to the open wound that has a 200 cm surface area.  The 7 x 10 cm Kerecis sheet was then folded and secured to the distal tibia and fibula with #1 Vicryl. The deep and superficial fascial layers were closed using #1 Vicryl.  The skin was closed using staples.    The Prevena customizable dressing was applied this was overwrapped with the arthroform sponge.  Yvone Herd was used to secure the sponges and the circumferential compression was secured to the skin with Dermatac.  This was connected to the wound VAC pump and had a good suction fit this was covered with a stump shrinker and a limb protector.  Patient was taken to the PACU in stable condition.   DISCHARGE PLANNING:  Antibiotic duration: 24-hour antibiotics  Weightbearing: Nonweightbearing on the operative extremity  Pain medication: Opioid pathway  Dressing care/ Wound VAC: Continue wound VAC with the Prevena plus pump at discharge for 1 week  Ambulatory devices: Walker or kneeling scooter  Discharge to: Discharge planning based on recommendations  per physical therapy  Follow-up: In the office 1 week after discharge.

## 2023-10-16 NOTE — Plan of Care (Signed)
Pt states no pain at present

## 2023-10-16 NOTE — Transfer of Care (Signed)
 Immediate Anesthesia Transfer of Care Note  Patient: [name]  Procedure(s) Performed: LEFT BELOW KNEE AMPUTATION (Left: Knee)  Patient Location: PACU  Anesthesia Type:MAC and MAC combined with regional for post-op pain  Level of Consciousness: awake and alert   Airway & Oxygen Therapy: Patient Spontanous Breathing and Patient connected to face mask oxygen  Post-op Assessment: Report given to RN and Post -op Vital signs reviewed and stable  Post vital signs: Reviewed and stable  Last Vitals:  Vitals Value Taken Time  BP 134/82 10/16/23 0831  Temp    Pulse 66 10/16/23 0833  Resp 18 10/16/23 0833  SpO2 94 % 10/16/23 0833  Vitals shown include unfiled device data.  Last Pain:  Vitals:   10/16/23 0609  TempSrc:   PainSc: 0-No pain         Complications: No notable events documented.

## 2023-10-17 LAB — CBC
HCT: 34.3 % — ABNORMAL LOW (ref 39.0–52.0)
Hemoglobin: 11.7 g/dL — ABNORMAL LOW (ref 13.0–17.0)
MCH: 29.3 pg (ref 26.0–34.0)
MCHC: 34.1 g/dL (ref 30.0–36.0)
MCV: 85.8 fL (ref 80.0–100.0)
Platelets: 174 10*3/uL (ref 150–400)
RBC: 4 MIL/uL — ABNORMAL LOW (ref 4.22–5.81)
RDW: 13.7 % (ref 11.5–15.5)
WBC: 9.4 10*3/uL (ref 4.0–10.5)
nRBC: 0 % (ref 0.0–0.2)

## 2023-10-17 LAB — GLUCOSE, CAPILLARY
Glucose-Capillary: 181 mg/dL — ABNORMAL HIGH (ref 70–99)
Glucose-Capillary: 183 mg/dL — ABNORMAL HIGH (ref 70–99)
Glucose-Capillary: 249 mg/dL — ABNORMAL HIGH (ref 70–99)
Glucose-Capillary: 225 mg/dL — ABNORMAL HIGH (ref 70–99)

## 2023-10-17 NOTE — Plan of Care (Signed)
  Problem: Education: Goal: Ability to describe self-care measures that may prevent or decrease complications (Diabetes Survival Skills Education) will improve Outcome: Progressing   Problem: Coping: Goal: Ability to adjust to condition or change in health will improve Outcome: Progressing   Problem: Fluid Volume: Goal: Ability to maintain a balanced intake and output will improve Outcome: Progressing   Problem: Health Behavior/Discharge Planning: Goal: Ability to identify and utilize available resources and services will improve Outcome: Progressing   Problem: Nutritional: Goal: Maintenance of adequate nutrition will improve Outcome: Progressing   Problem: Pain Management: Goal: Pain level will decrease with appropriate interventions Outcome: Progressing

## 2023-10-17 NOTE — Progress Notes (Signed)
 Inpatient Rehab Admissions:  Inpatient Rehab Consult received.  I met with patient at the bedside for rehabilitation assessment and to discuss goals and expectations of an inpatient rehab admission.  Discussed average length of stay, insurance authorization requirement and discharge home after completion of CIR. Pt acknowledged understanding. Pt interested in pursuing CIR. As of now, pt will have intermittent support after discharge. Pt will work on securing more assistance with family and friends. Will continue to follow.  Signed: Artemus Larsen, MS, CCC-SLP Admissions Coordinator [phone]

## 2023-10-17 NOTE — PMR Pre-admission (Signed)
 PMR Admission Coordinator Pre-Admission Assessment  Patient: [name] is an 58 y.o., male MRN: [id number] DOB: [birth year] Height: 6\' 4"  (193 cm) Weight: 97.5 kg  Insurance Information HMO:     PPO:      PCP:      IPA:      80/20:      OTHER:  PRIMARY: [place] [employer]      Policy#: [id number]      Subscriber: patient CM Name: faxed approval      Phone#: [phone]     Fax#: [phone] concurrent review [phone] Pre-Cert#: [id number] approved 5/20 until 5/29     Employer:  Benefits:  Phone #: [phone]     Name:  Eff. Date: 04/03/23-03/31/24     Deduct: $0-does not have one      Out of Pocket Max: $0-does not have one      Life Max: NA CIR: 100% coverage      SNF: 100% coverage Outpatient: $4 co-pay/visit     Co-Pay:  Home Health: 100% coverage      Co-Pay:  DME: 100% coverage     Co-Pay:  Providers: in-network SECONDARY:       Policy#:      Phone#:   Artist:       Phone#:   The Data processing manager" for patients in Inpatient Rehabilitation Facilities with attached "Privacy Act Statement-Health Care Records" was provided and verbally reviewed with: Patient  Emergency Contact Information Contact Information     Name Relation Home Work Mobile   [place] Sister   [phone]   Abdinasir, Spadafore   [phone]   Bronson, Bressman   [phone]   Tosi,Lucy Daughter   [phone]      Other Contacts   None on File     Current Medical History  Patient Admitting Diagnosis: Left BKA  History of Present Illness: Pt is a 58 year old male with medical hx significant for: fifth and partial fourth ray amputation, neuropathy, DM II, diverticulosis, HTN, hyperlipidemia, pancreatitis.  Pt presented to [facility] on 10/16/23 for planned left BKA.   Pt underwent left BKA and application of wound VAC by Dr. Julio Ohm on 10/16/23. Therapy evaluations completed and CIR recommended d/t pt's deficits in functional  mobility.   Patient's medical record from [facility] has been reviewed by the rehabilitation admission coordinator and physician.  Past Medical History  Past Medical History:  Diagnosis Date   ADHD (attention deficit hyperactivity disorder)    Anxiety and depression    Candida esophagitis (HCC)    in setting of DKA/hyperglycemia   CIDP (chronic inflammatory demyelinating polyneuropathy) (HCC)    Depression    Diabetes type 2, controlled (HCC)    Diverticulosis    Essential hypertension 06/21/2014   Hyperlipidemia    Kidney stones    Pancreatitis    Stomach ulcer    Vitamin D deficiency    Has the patient had major surgery during 100 days prior to admission? Yes  Family History   family history includes Breast cancer in his mother; Stroke in his father.  Current Medications  Current Facility-Administered Medications:    0.9 %  sodium chloride  infusion, , Intravenous, Continuous, Timothy Ford, MD   acetaminophen  (TYLENOL ) tablet 325-650 mg, 325-650 mg, Oral, Q6H PRN, Duda, Marcus V, MD, 325 mg at 10/19/23 1521   alum & mag hydroxide-simeth (MAALOX/MYLANTA) 200-200-20 MG/5ML suspension 15-30 mL, 15-30 mL, Oral, Q2H PRN, Duda, Marcus V, MD   amphetamine -dextroamphetamine  (  ADDERALL) tablet 30 mg, 30 mg, Oral, BID, Duda, Marcus V, MD, 30 mg at 10/19/23 1610   ascorbic acid  (VITAMIN C ) tablet 1,000 mg, 1,000 mg, Oral, Daily, Duda, Marcus V, MD, 1,000 mg at 10/19/23 9604   atorvastatin  (LIPITOR) tablet 10 mg, 10 mg, Oral, Daily, Duda, Marcus V, MD, 10 mg at 10/19/23 5409   bisacodyl  (DULCOLAX) EC tablet 5 mg, 5 mg, Oral, Daily PRN, Duda, Marcus V, MD   clonazePAM  (KLONOPIN ) tablet 1 mg, 1 mg, Oral, QHS, Duda, Marcus V, MD, 1 mg at 10/18/23 2244   docusate sodium  (COLACE) capsule 100 mg, 100 mg, Oral, Daily, Duda, Marcus V, MD, 100 mg at 10/19/23 8119   fenofibrate  tablet 160 mg, 160 mg, Oral, Daily, Duda, Marcus V, MD, 160 mg at 10/19/23 1478   gabapentin  (NEURONTIN )  capsule 800 mg, 800 mg, Oral, TID, Duda, Marcus V, MD, 800 mg at 10/19/23 1521   guaiFENesin -dextromethorphan  (ROBITUSSIN DM) 100-10 MG/5ML syrup 15 mL, 15 mL, Oral, Q4H PRN, Duda, Marcus V, MD, 15 mL at 10/18/23 2244   hydrALAZINE  (APRESOLINE ) injection 5 mg, 5 mg, Intravenous, Q20 Min PRN, Timothy Ford, MD   HYDROmorphone  (DILAUDID ) injection 0.5-1 mg, 0.5-1 mg, Intravenous, Q4H PRN, Duda, Marcus V, MD, 1 mg at 10/18/23 1319   insulin  aspart (novoLOG ) injection 0-15 Units, 0-15 Units, Subcutaneous, TID WC, Duda, Marcus V, MD, 2 Units at 10/19/23 1350   insulin  aspart (novoLOG ) injection 4 Units, 4 Units, Subcutaneous, TID WC, Duda, Marcus V, MD, 4 Units at 10/19/23 1343   insulin  glargine-yfgn (SEMGLEE ) injection 40 Units, 40 Units, Subcutaneous, QHS, Duda, Marcus V, MD, 40 Units at 10/18/23 2244   labetalol  (NORMODYNE ) injection 10 mg, 10 mg, Intravenous, Q10 min PRN, Duda, Marcus V, MD   lithium  carbonate capsule 150 mg, 150 mg, Oral, QHS, Duda, Marcus V, MD, 150 mg at 10/18/23 2243   lumateperone  tosylate (CAPLYTA ) capsule 42 mg, 42 mg, Oral, QHS, Duda, Marcus V, MD, 42 mg at 10/18/23 2243   magnesium  citrate solution 1 Bottle, 1 Bottle, Oral, Once PRN, Duda, Marcus V, MD   magnesium  sulfate IVPB 2 g 50 mL, 2 g, Intravenous, Daily PRN, Duda, Marcus V, MD   metoprolol  tartrate (LOPRESSOR ) injection 2-5 mg, 2-5 mg, Intravenous, Q2H PRN, Duda, Marcus V, MD   nutrition supplement (JUVEN) (JUVEN) powder packet 1 packet, 1 packet, Oral, BID BM, Duda, Marcus V, MD, 1 packet at 10/19/23 1343   ondansetron  (ZOFRAN ) injection 4 mg, 4 mg, Intravenous, Q6H PRN, Duda, Marcus V, MD, 4 mg at 10/18/23 2234   oxyCODONE  (Oxy IR/ROXICODONE ) immediate release tablet 10-15 mg, 10-15 mg, Oral, Q4H PRN, Duda, Marcus V, MD, 10 mg at 10/19/23 1522   oxyCODONE  (Oxy IR/ROXICODONE ) immediate release tablet 5-10 mg, 5-10 mg, Oral, Q4H PRN, Duda, Marcus V, MD, 10 mg at 10/18/23 2243   pantoprazole  (PROTONIX ) EC tablet 40  mg, 40 mg, Oral, Daily, Duda, Marcus V, MD, 40 mg at 10/19/23 0925   phenol (CHLORASEPTIC) mouth spray 1 spray, 1 spray, Mouth/Throat, PRN, Timothy Ford, MD   polyethylene glycol (MIRALAX  / GLYCOLAX ) packet 17 g, 17 g, Oral, Daily PRN, Duda, Marcus V, MD, 17 g at 10/19/23 2956   potassium chloride  SA (KLOR-CON  M) CR tablet 20-40 mEq, 20-40 mEq, Oral, Daily PRN, Duda, Marcus V, MD   traZODone  (DESYREL ) tablet 50-100 mg, 50-100 mg, Oral, QHS PRN, Duda, Marcus V, MD   venlafaxine  XR (EFFEXOR -XR) 24 hr capsule 75 mg, 75 mg, Oral, Q breakfast, Duda,  Marshia Skene, MD, 75 mg at 10/19/23 5784   zinc  sulfate (50mg  elemental zinc ) capsule 220 mg, 220 mg, Oral, Daily, Duda, Marcus V, MD, 220 mg at 10/19/23 6962  Patients Current Diet:  Diet Order             Diet Carb Modified Fluid consistency: Thin; Room service appropriate? Yes  Diet effective now                  Precautions / Restrictions Precautions Precautions: Fall Restrictions Weight Bearing Restrictions Per Provider Order: No   Has the patient had 2 or more falls or a fall with injury in the past year? Yes  Prior Activity Level Limited Community (1-2x/wk): gets out of house ~1-2 days/week  Prior Functional Level Self Care: Did the patient need help bathing, dressing, using the toilet or eating? Independent  Indoor Mobility: Did the patient need assistance with walking from room to room (with or without device)? Independent  Stairs: Did the patient need assistance with internal or external stairs (with or without device)? Independent  Functional Cognition: Did the patient need help planning regular tasks such as shopping or remembering to take medications? Independent  Patient Information Are you of Hispanic, Latino/a,or Spanish origin?: A. No, not of Hispanic, Latino/a, or Spanish origin What is your race?: A. White Do you need or want an interpreter to communicate with a doctor or health care staff?: 0. No  Patient's  Response To:  Health Literacy and Transportation Is the patient able to respond to health literacy and transportation needs?: Yes Health Literacy - How often do you need to have someone help you when you read instructions, pamphlets, or other written material from your doctor or pharmacy?: Never In the past 12 months, has lack of transportation kept you from medical appointments or from getting medications?: No In the past 12 months, has lack of transportation kept you from meetings, work, or from getting things needed for daily living?: No  Home Assistive Devices / Equipment Home Equipment: Other (comment), Rolling Walker (2 wheels), Cane - single point  Prior Device Use: Indicate devices/aids used by the patient prior to current illness, exacerbation or injury? Cane, knee scooter  Current Functional Level Cognition  Orientation Level: Oriented X4    Extremity Assessment (includes Sensation/Coordination)  Upper Extremity Assessment: Overall WFL for tasks assessed  Lower Extremity Assessment: Defer to PT evaluation    ADLs  Overall ADL's : Needs assistance/impaired Eating/Feeding: Set up, Sitting Grooming: Wash/dry hands, Contact guard assist, Standing Grooming Details (indicate cue type and reason): CGA for balance while standing Upper Body Dressing : Set up, Sitting Lower Body Dressing: Contact guard assist, Sit to/from stand Lower Body Dressing Details (indicate cue type and reason): Adequate standing balance Toilet Transfer: Contact guard assist, Ambulation, Rolling walker (2 wheels) Toilet Transfer Details (indicate cue type and reason): CGA for balance able to hop into bathroom with RW Toileting- Clothing Manipulation and Hygiene: Contact guard assist, Sit to/from stand Toileting - Clothing Manipulation Details (indicate cue type and reason): Adequate standing balance Functional mobility during ADLs: Contact guard assist, Rolling walker (2 wheels)    Mobility  Overal bed  mobility: Needs Assistance Bed Mobility: Supine to Sit Supine to sit: Supervision General bed mobility comments: Pt received in recliner    Transfers  Overall transfer level: Needs assistance Equipment used: Rolling walker (2 wheels) Transfers: Sit to/from Stand, Bed to chair/wheelchair/BSC Sit to Stand: Contact guard assist Bed to/from chair/wheelchair/BSC transfer type:: Step pivot Step  pivot transfers: Contact guard assist General transfer comment: CGA, cues for sequencing    Ambulation / Gait / Stairs / Wheelchair Mobility       Posture / Balance Balance Overall balance assessment: Needs assistance Sitting-balance support: Bilateral upper extremity supported, Feet supported Sitting balance-Leahy Scale: Good Standing balance support: Bilateral upper extremity supported, During functional activity, Reliant on assistive device for balance Standing balance-Leahy Scale: Fair Standing balance comment: Able to complete dynamic task, no LOB    Special needs/care consideration Wound VAC to surgical site    Previous Home Environment  Living Arrangements: Alone Available Help at Discharge: Family, Friend(s), Available PRN/intermittently Type of Home: House Home Layout: One level Home Access: Stairs to enter Entrance Stairs-Rails: None Entrance Stairs-Number of Steps: 2 Bathroom Shower/Tub: Associate Professor: Yes How Accessible: Accessible via walker Home Care Services: No Additional Comments: knee scooter, has been on it for the last 6 months  Discharge Living Setting Plans for Discharge Living Setting: Patient's home Type of Home at Discharge: House Discharge Home Layout: One level Discharge Home Access: Stairs to enter Entrance Stairs-Rails: None Entrance Stairs-Number of Steps: 2 Discharge Bathroom Shower/Tub: Tub/shower unit Discharge Bathroom Toilet: Standard Discharge Bathroom Accessibility: Yes How Accessible:  Accessible via walker Does the patient have any problems obtaining your medications?: No  Social/Family/Support Systems Anticipated Caregiver: Vishal Sandlin, sister Anticipated Caregiver's Contact Information: [phone] Caregiver Availability: 24/7 Discharge Plan Discussed with Primary Caregiver:  (pt preferred to discuss CIR goals and expectations with sister) Is Caregiver In Agreement with Plan?: Yes Does Caregiver/Family have Issues with Lodging/Transportation while Pt is in Rehab?: No  Sister, [name], from [place] , to come stay with him at discharge initially  Goals Patient/Family Goal for Rehab: Mod I-Supervision: PT/OT Expected length of stay: 5-7 days Pt/Family Agrees to Admission and willing to participate: Yes Program Orientation Provided & Reviewed with Pt/Caregiver Including Roles  & Responsibilities: Yes  Decrease burden of Care through IP rehab admission: NA  Possible need for SNF placement upon discharge: Not anticipated  Patient Condition: I have reviewed medical records from [facility], spoken with CM, and patient. I met with patient at the bedside for inpatient rehabilitation assessment.  Patient will benefit from ongoing PT and OT, can actively participate in 3 hours of therapy a day 5 days of the week, and can make measurable gains during the admission.  Patient will also benefit from the coordinated team approach during an Inpatient Acute Rehabilitation admission.  The patient will receive intensive therapy as well as Rehabilitation physician, nursing, social worker, and care management interventions.  Due to safety, skin/wound care, disease management, medication administration, pain management, and patient education the patient requires 24 hour a day rehabilitation nursing.  The patient is currently min assist overall with mobility and basic ADLs.  Discharge setting and therapy post discharge at home with home health is anticipated.  Patient has agreed to  participate in the Acute Inpatient Rehabilitation Program and will admit today.  Preadmission Screen Completed By:  Amiel Kalata, 10/19/2023 3:39 PM ______________________________________________________________________   Discussed status with Dr. Raynaldo Call on 10/20/23 at 1050 and received approval for admission today.  Admission Coordinator:  Amiel Kalata, CCC-SLP, time 1050 Date 10/20/23   Assessment/Plan: Diagnosis:  L BKA Does the need for close, 24 hr/day Medical supervision in concert with the patient's rehab needs make it unreasonable for this patient to be served in a less intensive setting? Yes Co-Morbidities requiring supervision/potential complications:  DM, peripehral neuropathy, Diverticulosis, HTN, Pancreatitis, HLD, osteomyelitis of L foot Due to bladder management, bowel management, safety, skin/wound care, disease management, medication administration, pain management, and patient education, does the patient require 24 hr/day rehab nursing? Yes Does the patient require coordinated care of a physician, rehab nurse, PT, OT to address physical and functional deficits in the context of the above medical diagnosis(es)? Yes Addressing deficits in the following areas: balance, endurance, locomotion, strength, transferring, bowel/bladder control, bathing, dressing, feeding, grooming, and toileting Can the patient actively participate in an intensive therapy program of at least 3 hrs of therapy 5 days a week? Yes The potential for patient to make measurable gains while on inpatient rehab is good Anticipated functional outcomes upon discharge from inpatient rehab: modified independent and supervision PT, modified independent and supervision OT, n/a SLP Estimated rehab length of stay to reach the above functional goals is: 5-7 days Anticipated discharge destination: Home 10. Overall Rehab/Functional Prognosis: good   MD Signature:

## 2023-10-17 NOTE — Evaluation (Signed)
 Physical Therapy Evaluation Patient Details Name: [name] MRN: [id number] DOB: 1965-09-14 Today's Date: 10/17/2023  History of Present Illness  Pt is a 58 y.o. 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 PMH: neuropathy, DM, diverticulosis, HTN, hyperlipidemia, pancreatitis.  Clinical Impression  Pt presents with min L residual limb discomfort (block still in effect), min difficulty mobilizing, impaired standing balance, and decreased activity tolerance vs baseline. Pt to benefit from acute PT to address deficits. Pt requiring light steadying assist and cuing for hop-pivot transfer OOB to recliner, further distance deferred due to fatigue and weakness. Patient will benefit from intensive inpatient follow-up therapy, >3 hours/day. PT to progress mobility as tolerated, and will continue to follow acutely.          If plan is discharge home, recommend the following: A little help with walking and/or transfers;A little help with bathing/dressing/bathroom   Can travel by private vehicle        Equipment Recommendations None recommended by PT  Recommendations for Other Services       Functional Status Assessment Patient has had a recent decline in their functional status and demonstrates the ability to make significant improvements in function in a reasonable and predictable amount of time.     Precautions / Restrictions Precautions Precautions: Fall Restrictions Weight Bearing Restrictions Per Provider Order: No      Mobility  Bed Mobility Overal bed mobility: Needs Assistance Bed Mobility: Supine to Sit     Supine to sit: Supervision          Transfers Overall transfer level: Needs assistance Equipment used: Rolling walker (2 wheels) Transfers: Sit to/from Stand, Bed to chair/wheelchair/BSC Sit to Stand: Contact guard assist   Step pivot transfers: Min assist       General transfer comment: close guard for safety when rising, cues for correct hand  placement when rising and sitting. light steadying and RW assist during hop-pivot to recliner towards pt's R.    Ambulation/Gait                  Stairs            Wheelchair Mobility     Tilt Bed    Modified Rankin (Stroke Patients Only)       Balance Overall balance assessment: Needs assistance Sitting-balance support: No upper extremity supported, Feet supported Sitting balance-Leahy Scale: Fair     Standing balance support: Bilateral upper extremity supported, During functional activity, Reliant on assistive device for balance Standing balance-Leahy Scale: Poor                               Pertinent Vitals/Pain Pain Assessment Pain Assessment: Faces Faces Pain Scale: Hurts a little bit Pain Location: L residual limb Pain Descriptors / Indicators: Guarding, Grimacing, Discomfort Pain Intervention(s): Limited activity within patient's tolerance, Monitored during session, Repositioned    Home Living Family/patient expects to be discharged to:: Private residence Living Arrangements: Alone Available Help at Discharge: Family;Friend(s);Available PRN/intermittently Type of Home: House Home Access: Stairs to enter Entrance Stairs-Rails: None Entrance Stairs-Number of Steps: 2   Home Layout: One level Home Equipment: Other (comment);Rolling Walker (2 wheels);Cane - single point Additional Comments: knee scooter, has been on it for the last 6 months    Prior Function Prior Level of Function : Independent/Modified Independent                     Extremity/Trunk  Assessment   Upper Extremity Assessment Upper Extremity Assessment: Defer to OT evaluation    Lower Extremity Assessment Lower Extremity Assessment: Generalized weakness    Cervical / Trunk Assessment Cervical / Trunk Assessment: Normal  Communication   Communication Communication: No apparent difficulties    Cognition Arousal: Alert Behavior During Therapy: WFL  for tasks assessed/performed   PT - Cognitive impairments: No apparent impairments                         Following commands: Intact       Cueing Cueing Techniques: Verbal cues     General Comments      Exercises Amputee Exercises Quad Sets: AROM, Left, 10 reps, Seated   Assessment/Plan    PT Assessment Patient needs continued PT services  PT Problem List Decreased strength;Decreased mobility;Decreased activity tolerance;Decreased balance;Decreased knowledge of use of DME;Pain;Decreased range of motion;Decreased safety awareness;Decreased skin integrity       PT Treatment Interventions DME instruction;Therapeutic activities;Gait training;Therapeutic exercise;Patient/family education;Balance training;Stair training;Functional mobility training;Neuromuscular re-education    PT Goals (Current goals can be found in the Care Plan section)  Acute Rehab PT Goals PT Goal Formulation: With patient Time For Goal Achievement: 10/31/23 Potential to Achieve Goals: Good    Frequency Min 1X/week     Co-evaluation               AM-PAC PT "6 Clicks" Mobility  Outcome Measure Help needed turning from your back to your side while in a flat bed without using bedrails?: A Little Help needed moving from lying on your back to sitting on the side of a flat bed without using bedrails?: A Little Help needed moving to and from a bed to a chair (including a wheelchair)?: A Little Help needed standing up from a chair using your arms (e.g., wheelchair or bedside chair)?: A Little Help needed to walk in hospital room?: A Lot Help needed climbing 3-5 steps with a railing? : Total 6 Click Score: 15    End of Session   Activity Tolerance: Patient tolerated treatment well Patient left: in chair;with call bell/phone within reach;with chair alarm set;with family/visitor present Nurse Communication: Mobility status PT Visit Diagnosis: Other abnormalities of gait and mobility  (R26.89);Muscle weakness (generalized) (M62.81)    Time: 1610-9604 PT Time Calculation (min) (ACUTE ONLY): 31 min   Charges:   PT Evaluation $PT Eval Low Complexity: 1 Low PT Treatments $Therapeutic Activity: 8-22 mins PT General Charges $$ ACUTE PT VISIT: 1 Visit         Shirlene Doughty, PT DPT Acute Rehabilitation Services Secure Chat Preferred  Office [phone]   Kylo Gavin E Burnadette Carrion 10/17/2023, 3:05 PM

## 2023-10-17 NOTE — Plan of Care (Signed)
  Problem: Coping: Goal: Ability to adjust to condition or change in health will improve Outcome: Progressing   Problem: Nutritional: Goal: Maintenance of adequate nutrition will improve Outcome: Progressing   Problem: Activity: Goal: Ability to perform//tolerate increased activity and mobilize with assistive devices will improve Outcome: Progressing   Problem: Self-Care: Goal: Ability to meet self-care needs will improve Outcome: Progressing

## 2023-10-17 NOTE — Evaluation (Signed)
 Occupational Therapy Evaluation Patient Details Name: [name] MRN: [id number] DOB: 03-26-1966 Today's Date: 10/17/2023   History of Present Illness   Pt is a 58 y.o. 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 PMH: neuropathy, DM, diverticulosis, HTN, hyperlipidemia, pancreatitis.     Clinical Impressions Pt admitted based on above, and was seen based on problem list below. PTA pt was independent with ADLs and IADLs. Today pt is requiring set up  to CGA for ADLs. Functional transfers are CGA. OT educated pt on limb healing and prosthesis process. Encouraged chair push ups to continue to build BUE strength. Pt highly motivated to progress with therapy. Anticipate pt will progress well with therapy and return to Mod I.  Recommendation of >3 hours of skilled rehab daily. OT will continue to follow acutely to maximize functional independence.     If plan is discharge home, recommend the following:   A little help with walking and/or transfers;A little help with bathing/dressing/bathroom     Functional Status Assessment   Patient has had a recent decline in their functional status and demonstrates the ability to make significant improvements in function in a reasonable and predictable amount of time.     Equipment Recommendations   Other (comment) (Defer to next venue)     Recommendations for Other Services   Rehab consult     Precautions/Restrictions   Precautions Precautions: Fall Recall of Precautions/Restrictions: Intact Restrictions Weight Bearing Restrictions Per Provider Order: No     Mobility Bed Mobility Overal bed mobility: Needs Assistance       General bed mobility comments: Pt received in recliner    Transfers Overall transfer level: Needs assistance Equipment used: Rolling walker (2 wheels) Transfers: Sit to/from Stand, Bed to chair/wheelchair/BSC Sit to Stand: Contact guard assist     Step pivot transfers: Contact guard assist      General transfer comment: CGA, cues for sequencing      Balance Overall balance assessment: Needs assistance Sitting-balance support: Bilateral upper extremity supported, Feet supported Sitting balance-Leahy Scale: Good     Standing balance support: Bilateral upper extremity supported, During functional activity, Reliant on assistive device for balance Standing balance-Leahy Scale: Fair Standing balance comment: Able to complete dynamic task, no LOB       ADL either performed or assessed with clinical judgement   ADL Overall ADL's : Needs assistance/impaired Eating/Feeding: Set up;Sitting   Grooming: Wash/dry hands;Contact guard assist;Standing Grooming Details (indicate cue type and reason): CGA for balance while standing         Upper Body Dressing : Set up;Sitting   Lower Body Dressing: Contact guard assist;Sit to/from stand Lower Body Dressing Details (indicate cue type and reason): Adequate standing balance Toilet Transfer: Contact guard assist;Ambulation;Rolling walker (2 wheels) Toilet Transfer Details (indicate cue type and reason): CGA for balance able to hop into bathroom with RW Toileting- Clothing Manipulation and Hygiene: Contact guard assist;Sit to/from stand Toileting - Clothing Manipulation Details (indicate cue type and reason): Adequate standing balance     Functional mobility during ADLs: Contact guard assist;Rolling walker (2 wheels)       Vision Baseline Vision/History: 0 No visual deficits Vision Assessment?: No apparent visual deficits            Pertinent Vitals/Pain Pain Assessment Pain Assessment: Faces Faces Pain Scale: Hurts a little bit Pain Location: L residual limb Pain Descriptors / Indicators: Guarding, Grimacing, Discomfort Pain Intervention(s): Monitored during session     Extremity/Trunk Assessment Upper Extremity Assessment Upper  Extremity Assessment: Overall WFL for tasks assessed   Lower Extremity Assessment Lower  Extremity Assessment: Defer to PT evaluation   Cervical / Trunk Assessment Cervical / Trunk Assessment: Normal   Communication Communication Communication: No apparent difficulties   Cognition Arousal: Alert Behavior During Therapy: WFL for tasks assessed/performed Cognition: No apparent impairments       Following commands: Intact       Cueing  General Comments   Cueing Techniques: Verbal cues  Wound vac intact   Exercises Exercises: Other exercises Other Exercises Other Exercises: 10 reps of chair push ups        Home Living Family/patient expects to be discharged to:: Private residence Living Arrangements: Alone Available Help at Discharge: Family;Friend(s);Available PRN/intermittently Type of Home: House Home Access: Stairs to enter [employer] of Steps: 2 Entrance Stairs-Rails: None Home Layout: One level     Bathroom Shower/Tub: Chief Strategy Officer: Standard Bathroom Accessibility: Yes How Accessible: Accessible via walker Home Equipment: Other (comment);Rolling Walker (2 wheels);Cane - single point   Additional Comments: knee scooter, has been on it for the last 6 months      Prior Functioning/Environment Prior Level of Function : Independent/Modified Independent      OT Problem List: Decreased strength;Decreased range of motion;Decreased activity tolerance   OT Treatment/Interventions: Self-care/ADL training;Therapeutic exercise;Energy conservation;DME and/or AE instruction;Therapeutic activities;Patient/family education;Balance training      OT Goals(Current goals can be found in the care plan section)   Acute Rehab OT Goals Patient Stated Goal: To go to rehab OT Goal Formulation: With patient Time For Goal Achievement: 10/31/23 Potential to Achieve Goals: Good   OT Frequency:  Min 2X/week       AM-PAC OT "6 Clicks" Daily Activity     Outcome Measure Help from another person eating meals?: None Help from  another person taking care of personal grooming?: A Little Help from another person toileting, which includes using toliet, bedpan, or urinal?: A Little Help from another person bathing (including washing, rinsing, drying)?: A Little Help from another person to put on and taking off regular upper body clothing?: A Little Help from another person to put on and taking off regular lower body clothing?: A Little 6 Click Score: 19   End of Session Equipment Utilized During Treatment: Rolling walker (2 wheels);Other (comment) (Wound vac) Nurse Communication: Mobility status  Activity Tolerance: Patient tolerated treatment well Patient left: in chair;with call bell/phone within reach;with chair alarm set  OT Visit Diagnosis: Unsteadiness on feet (R26.81);Other abnormalities of gait and mobility (R26.89)                Time: 1355-1420 OT Time Calculation (min): 25 min Charges:  OT General Charges $OT Visit: 1 Visit OT Evaluation $OT Eval Moderate Complexity: 1 Mod  Delmer Ferraris, OT  Acute Rehabilitation Services Office [phone] Secure chat preferred   Mickael Alamo 10/17/2023, 4:11 PM

## 2023-10-17 NOTE — Progress Notes (Signed)
 Patient ID: [name], male   DOB: [birth year], 58 y.o.   MRN: [id number] Patient is postoperative day 1 left below-knee amputation.  There is no drainage in the wound VAC canister there is a good suction fit.  Patient had the sciatic nerve and anterior tibial nerve treated with cryotherapy.  Patient states he has no pain this morning.  Patient to be evaluated for discharge to inpatient rehab.

## 2023-10-18 LAB — CBC
HCT: 31.7 % — ABNORMAL LOW (ref 39.0–52.0)
Hemoglobin: 10.7 g/dL — ABNORMAL LOW (ref 13.0–17.0)
MCH: 29.2 pg (ref 26.0–34.0)
MCHC: 33.8 g/dL (ref 30.0–36.0)
MCV: 86.6 fL (ref 80.0–100.0)
Platelets: 157 10*3/uL (ref 150–400)
RBC: 3.66 MIL/uL — ABNORMAL LOW (ref 4.22–5.81)
RDW: 13.6 % (ref 11.5–15.5)
WBC: 8 10*3/uL (ref 4.0–10.5)
nRBC: 0 % (ref 0.0–0.2)

## 2023-10-18 LAB — GLUCOSE, CAPILLARY
Glucose-Capillary: 138 mg/dL — ABNORMAL HIGH (ref 70–99)
Glucose-Capillary: 171 mg/dL — ABNORMAL HIGH (ref 70–99)
Glucose-Capillary: 176 mg/dL — ABNORMAL HIGH (ref 70–99)
Glucose-Capillary: 236 mg/dL — ABNORMAL HIGH (ref 70–99)

## 2023-10-18 NOTE — Plan of Care (Signed)
  Problem: Coping: Goal: Ability to adjust to condition or change in health will improve Outcome: Progressing   Problem: Nutritional: Goal: Maintenance of adequate nutrition will improve Outcome: Progressing Goal: Progress toward achieving an optimal weight will improve Outcome: Progressing   Problem: Skin Integrity: Goal: Risk for impaired skin integrity will decrease Outcome: Progressing   Problem: Tissue Perfusion: Goal: Adequacy of tissue perfusion will improve Outcome: Progressing   Problem: Activity: Goal: Ability to perform//tolerate increased activity and mobilize with assistive devices will improve Outcome: Progressing   Problem: Pain Management: Goal: Pain level will decrease with appropriate interventions Outcome: Progressing   Problem: Skin Integrity: Goal: Demonstration of wound healing without infection will improve Outcome: Progressing

## 2023-10-18 NOTE — Progress Notes (Signed)
 Pt is yellow MEWS due to temp and BP. Notified Dr. Gearldean Keepers and CN Sharlot Deal, RN. Pt was previously yellow MEWS due to temp. Pt given tylenol . Imitated yellow MEWS protocol will continue to monitor and report off to night nurse.   10/18/23 1659  Vitals  Temp (!) 102.3 F (39.1 C)  Temp Source Oral  BP (!) 146/72  MAP (mmHg) 92  BP Location Right Arm  BP Method Automatic  Patient Position (if appropriate) Sitting  Pulse Rate 86  Pulse Rate Source Monitor  Resp 17  MEWS COLOR  MEWS Score Color Yellow  Oxygen Therapy  SpO2 100 %  O2 Device Room Air  MEWS Score  MEWS Temp 2  MEWS Systolic 0  MEWS Pulse 0  MEWS RR 0  MEWS LOC 0  MEWS Score 2  Provider Notification  Provider Name/Title Dr. Gearldean Keepers  Date Provider Notified 10/18/23  Time Provider Notified 1741  Method of Notification  (secure chat)  Notification Reason Change in status;Other (Comment) (yellow MEWS d/t fever)

## 2023-10-18 NOTE — Anesthesia Postprocedure Evaluation (Signed)
 Anesthesia Post Note  Patient: [name]  Procedure(s) Performed: LEFT BELOW KNEE AMPUTATION (Left: Knee) APPLICATION, WOUND VAC (Left: Leg Lower)     Patient location during evaluation: PACU Anesthesia Type: Regional Level of consciousness: awake and alert Pain management: pain level controlled Vital Signs Assessment: post-procedure vital signs reviewed and stable Respiratory status: spontaneous breathing Cardiovascular status: stable Anesthetic complications: no  No notable events documented.  Last Vitals:  Vitals:   10/18/23 1833 10/18/23 2035  BP:  (!) 147/69  Pulse:  90  Resp:  16  Temp: (!) 39.3 C (!) 38.6 C  SpO2:  98%    Last Pain:  Vitals:   10/18/23 2243  TempSrc:   PainSc: 6                  Gorman Laughter

## 2023-10-18 NOTE — Progress Notes (Signed)
 Subjective: 2 Days Post-Op Procedure(s) (LRB): LEFT BELOW KNEE AMPUTATION (Left) APPLICATION, WOUND VAC (Left) Patient reports pain as mild.  Felt his first "phantom pain" overnight.  Objective: Vital signs in last 24 hours: Temp:  [98.6 F (37 C)-100.8 F (38.2 C)] 99.5 F (37.5 C) (05/18 0751) Pulse Rate:  [79-87] 79 (05/18 0751) Resp:  [17-18] 17 (05/18 0751) BP: (99-142)/(63-80) 130/80 (05/18 0751) SpO2:  [96 %-100 %] 97 % (05/18 0751)  Intake/Output from previous day: 05/17 0701 - 05/18 0700 In: -  Out: 2100 [Urine:2100] Intake/Output this shift: Total I/O In: -  Out: 800 [Urine:800]  Recent Labs    10/16/23 0618 10/17/23 0343 10/18/23 0855  HGB 12.8* 11.7* 10.7*   Recent Labs    10/17/23 0343 10/18/23 0855  WBC 9.4 8.0  RBC 4.00* 3.66*  HCT 34.3* 31.7*  PLT 174 157   Recent Labs    10/16/23 0618  NA 139  K 3.9  CL 103  CO2 27  BUN 11  CREATININE 1.19  GLUCOSE 200*  CALCIUM  9.3   No results for input(s): "LABPT", "INR" in the last 72 hours.  Physical Exam Wound vac in place with good seal.  No fluid in canister   Assessment/Plan: 2 Days Post-Op Procedure(s) (LRB): LEFT BELOW KNEE AMPUTATION (Left) APPLICATION, WOUND VAC (Left) PLAN Continue with wound vac.   D/c dispo- hopeful for CIR      Sandie Cross 10/18/2023, 10:46 AM

## 2023-10-19 ENCOUNTER — Encounter (HOSPITAL_COMMUNITY): Payer: Self-pay | Admitting: Orthopedic Surgery

## 2023-10-19 LAB — GLUCOSE, CAPILLARY
Glucose-Capillary: 211 mg/dL — ABNORMAL HIGH (ref 70–99)
Glucose-Capillary: 260 mg/dL — ABNORMAL HIGH (ref 70–99)
Glucose-Capillary: 121 mg/dL — ABNORMAL HIGH (ref 70–99)
Glucose-Capillary: 195 mg/dL — ABNORMAL HIGH (ref 70–99)
Glucose-Capillary: 183 mg/dL — ABNORMAL HIGH (ref 70–99)

## 2023-10-19 LAB — SURGICAL PATHOLOGY

## 2023-10-19 NOTE — Plan of Care (Signed)
  Problem: Education: Goal: Ability to describe self-care measures that may prevent or decrease complications (Diabetes Survival Skills Education) will improve 10/19/2023 0444 by Alba Huddle, LPN Outcome: Progressing 10/19/2023 0442 by Alba Huddle, LPN Outcome: Progressing 10/19/2023 0430 by Alba Huddle, LPN Outcome: Progressing   Problem: Coping: Goal: Ability to adjust to condition or change in health will improve 10/19/2023 0444 by Alba Huddle, LPN Outcome: Progressing 10/19/2023 0442 by Alba Huddle, LPN Outcome: Progressing 10/19/2023 0430 by Alba Huddle, LPN Outcome: Progressing   Problem: Activity: Goal: Ability to perform//tolerate increased activity and mobilize with assistive devices will improve 10/19/2023 0442 by Alba Huddle, LPN Outcome: Progressing 10/19/2023 0430 by Alba Huddle, LPN Outcome: Progressing   Problem: Self-Care: Goal: Ability to meet self-care needs will improve Outcome: Progressing   Problem: Pain Management: Goal: Pain level will decrease with appropriate interventions 10/19/2023 0444 by Alba Huddle, LPN Outcome: Progressing 10/19/2023 0442 by Alba Huddle, LPN Outcome: Progressing   Problem: Activity: Goal: Risk for activity intolerance will decrease Outcome: Progressing   Problem: Nutrition: Goal: Adequate nutrition will be maintained Outcome: Progressing   Problem: Safety: Goal: Ability to remain free from injury will improve 10/19/2023 0444 by Alba Huddle, LPN Outcome: Progressing 10/19/2023 0442 by Alba Huddle, LPN Outcome: Progressing 10/19/2023 0430 by Alba Huddle, LPN Outcome: Progressing

## 2023-10-19 NOTE — Plan of Care (Signed)
  Problem: Coping: Goal: Ability to adjust to condition or change in health will improve Outcome: Progressing   Problem: Skin Integrity: Goal: Risk for impaired skin integrity will decrease Outcome: Progressing   Problem: Activity: Goal: Ability to perform//tolerate increased activity and mobilize with assistive devices will improve Outcome: Progressing   Problem: Pain Management: Goal: Pain level will decrease with appropriate interventions Outcome: Progressing

## 2023-10-19 NOTE — Progress Notes (Signed)
 Mobility Specialist Progress Note:    10/19/23 1224  Mobility  Activity Ambulated with assistance in hallway  Level of Assistance Contact guard assist, steadying assist  Assistive Device Front wheel walker  Distance Ambulated (ft) 90 ft  Activity Response Tolerated well  Mobility Referral Yes  Mobility visit 1 Mobility  Mobility Specialist Start Time (ACUTE ONLY) 1107  Mobility Specialist Stop Time (ACUTE ONLY) 1130  Mobility Specialist Time Calculation (min) (ACUTE ONLY) 23 min   Pt received in bed agreeable to mobility. No physical assistance needed, contact guard for safety. Took x1 seated rest break d/t fatigue, otherwise no c/o. Returned to room w/o fault. Call bell and personal belongings in reach. Left seated EOB w/ visitor in room  Inetta Manes Mobility Specialist  Please contact vis Secure Chat or  Rehab Office [phone]

## 2023-10-19 NOTE — Progress Notes (Addendum)
 Inpatient Rehab Admissions Coordinator:  Saw pt at bedside. He confirmed that he spoke with his sister [name]. [name] has agreed to move in with him for a few weeks post discharge to provide support for pt. Will continue to follow.  Insurance authorization started.   Artemus Larsen, MS, CCC-SLP Admissions Coordinator [phone]

## 2023-10-19 NOTE — Plan of Care (Signed)
  Problem: Education: Goal: Ability to describe self-care measures that may prevent or decrease complications (Diabetes Survival Skills Education) will improve 10/19/2023 0442 by Alba Huddle, LPN Outcome: Progressing 10/19/2023 0430 by Alba Huddle, LPN Outcome: Progressing   Problem: Coping: Goal: Ability to adjust to condition or change in health will improve 10/19/2023 0442 by Alba Huddle, LPN Outcome: Progressing 10/19/2023 0430 by Alba Huddle, LPN Outcome: Progressing   Problem: Activity: Goal: Ability to perform//tolerate increased activity and mobilize with assistive devices will improve 10/19/2023 0442 by Alba Huddle, LPN Outcome: Progressing 10/19/2023 0430 by Alba Huddle, LPN Outcome: Progressing   Problem: Self-Care: Goal: Ability to meet self-care needs will improve Outcome: Progressing   Problem: Pain Management: Goal: Pain level will decrease with appropriate interventions Outcome: Progressing   Problem: Activity: Goal: Risk for activity intolerance will decrease Outcome: Progressing   Problem: Safety: Goal: Ability to remain free from injury will improve 10/19/2023 0442 by Alba Huddle, LPN Outcome: Progressing 10/19/2023 0430 by Alba Huddle, LPN Outcome: Progressing

## 2023-10-19 NOTE — Progress Notes (Signed)
 Physical Therapy Treatment Patient Details Name: [name] MRN: [id number] DOB: March 09, 1966 Today's Date: 10/19/2023   History of Present Illness Pt is a 58 y.o. 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 PMH: neuropathy, DM, diverticulosis, HTN, hyperlipidemia, pancreatitis.    PT Comments  Pt endorsing increased residual limb pain, states block wore off over the weekend. Pt ambulatory for short hallway distance, requires increased time and mod cuing for sequencing and proper form. Pt tolerating supine BKA exercises well, PT explaining exercise progression as rehab continues. Pt motivated to go to AIR.      If plan is discharge home, recommend the following: A little help with walking and/or transfers;A little help with bathing/dressing/bathroom   Can travel by private vehicle        Equipment Recommendations  None recommended by PT    Recommendations for Other Services       Precautions / Restrictions Precautions Precautions: Fall Recall of Precautions/Restrictions: Intact Restrictions Weight Bearing Restrictions Per Provider Order: No     Mobility  Bed Mobility Overal bed mobility: Needs Assistance Bed Mobility: Supine to Sit, Sit to Supine     Supine to sit: Supervision Sit to supine: Supervision        Transfers Overall transfer level: Needs assistance Equipment used: Rolling walker (2 wheels) Transfers: Sit to/from Stand Sit to Stand: Min assist           General transfer comment: light rise and steady assist from low surface. stand x2 from EOB, first attempt letting go of RW with L hand with LOB and poor eccentric lower back onto EOB.    Ambulation/Gait Ambulation/Gait assistance: Contact guard assist Gait Distance (Feet): 75 Feet Assistive device: Rolling walker (2 wheels) Gait Pattern/deviations: Step-to pattern Gait velocity: decr     General Gait Details: hop-to pattern with mod cuing for sequencing, placement of RW, placement of R  foot within RW, and alignment of LLE as pt with tendency for hip flexion and abduction.   Stairs             Wheelchair Mobility     Tilt Bed    Modified Rankin (Stroke Patients Only)       Balance Overall balance assessment: Needs assistance Sitting-balance support: Bilateral upper extremity supported, Feet supported Sitting balance-Leahy Scale: Good     Standing balance support: Bilateral upper extremity supported, During functional activity, Reliant on assistive device for balance Standing balance-Leahy Scale: Fair Standing balance comment: Able to complete dynamic task, no LOB                            Communication Communication Communication: No apparent difficulties Factors Affecting Communication: Hearing impaired  Cognition Arousal: Alert Behavior During Therapy: WFL for tasks assessed/performed   PT - Cognitive impairments: No apparent impairments                         Following commands: Intact      Cueing Cueing Techniques: Verbal cues  Exercises Amputee Exercises Quad Sets: AROM, Left, 10 reps, Seated Gluteal Sets: AROM, Left, 10 reps, Supine Hip ABduction/ADduction: AROM, Left, 10 reps, Supine Hip Flexion/Marching: AROM, Left, 10 reps, Supine    General Comments        Pertinent Vitals/Pain Pain Assessment Pain Assessment: Faces Faces Pain Scale: Hurts even more Pain Location: L residual limb Pain Descriptors / Indicators: Guarding, Grimacing, Discomfort Pain Intervention(s): Limited activity within patient's  tolerance, Monitored during session, Repositioned, Premedicated before session    Home Living                          Prior Function            PT Goals (current goals can now be found in the care plan section) Acute Rehab PT Goals PT Goal Formulation: With patient Time For Goal Achievement: 10/31/23 Potential to Achieve Goals: Good Progress towards PT goals: Progressing toward goals     Frequency    Min 1X/week      PT Plan      Co-evaluation              AM-PAC PT "6 Clicks" Mobility   Outcome Measure  Help needed turning from your back to your side while in a flat bed without using bedrails?: A Little Help needed moving from lying on your back to sitting on the side of a flat bed without using bedrails?: A Little Help needed moving to and from a bed to a chair (including a wheelchair)?: A Little Help needed standing up from a chair using your arms (e.g., wheelchair or bedside chair)?: A Little Help needed to walk in hospital room?: A Little Help needed climbing 3-5 steps with a railing? : Total 6 Click Score: 16    End of Session Equipment Utilized During Treatment: Gait belt Activity Tolerance: Patient tolerated treatment well Patient left: in bed;with bed alarm set;with call bell/phone within reach Nurse Communication: Mobility status PT Visit Diagnosis: Other abnormalities of gait and mobility (R26.89);Muscle weakness (generalized) (M62.81)     Time: 1610-9604 PT Time Calculation (min) (ACUTE ONLY): 34 min  Charges:    $Gait Training: 8-22 mins $Therapeutic Exercise: 8-22 mins PT General Charges $$ ACUTE PT VISIT: 1 Visit                     Shirlene Doughty, PT DPT Acute Rehabilitation Services Secure Chat Preferred  Office [phone]    Chikita Dogan Cydney Draft 10/19/2023, 4:43 PM

## 2023-10-19 NOTE — Progress Notes (Signed)
 Patient ID: [name], male   DOB: 1965/10/10, 58 y.o.   MRN: [id number] Patient is seen in follow-up status post left below-knee amputation.  There is no drainage in the wound VAC canister.  Patient being evaluated for discharge to inpatient rehab.

## 2023-10-20 ENCOUNTER — Inpatient Hospital Stay (HOSPITAL_COMMUNITY)
Admission: AD | Admit: 2023-10-20 | Discharge: 2023-10-31 | DRG: 560 | Disposition: A | Discharge status: Home / Self Care | Source: Intra-hospital | Attending: Physical Medicine and Rehabilitation | Admitting: Physical Medicine and Rehabilitation

## 2023-10-20 ENCOUNTER — Ambulatory Visit: Admitting: Physical Therapy

## 2023-10-20 DIAGNOSIS — Z794 Long term (current) use of insulin: Secondary | ICD-10-CM | POA: Diagnosis not present

## 2023-10-20 DIAGNOSIS — I10 Essential (primary) hypertension: Secondary | ICD-10-CM | POA: Diagnosis not present

## 2023-10-20 DIAGNOSIS — Z803 Family history of malignant neoplasm of breast: Secondary | ICD-10-CM | POA: Diagnosis not present

## 2023-10-20 DIAGNOSIS — E785 Hyperlipidemia, unspecified: Secondary | ICD-10-CM | POA: Diagnosis present

## 2023-10-20 DIAGNOSIS — Z8711 Personal history of peptic ulcer disease: Secondary | ICD-10-CM | POA: Diagnosis not present

## 2023-10-20 DIAGNOSIS — F3181 Bipolar II disorder: Secondary | ICD-10-CM | POA: Diagnosis present

## 2023-10-20 DIAGNOSIS — I129 Hypertensive chronic kidney disease with stage 1 through stage 4 chronic kidney disease, or unspecified chronic kidney disease: Secondary | ICD-10-CM | POA: Diagnosis present

## 2023-10-20 DIAGNOSIS — K5900 Constipation, unspecified: Secondary | ICD-10-CM | POA: Diagnosis present

## 2023-10-20 DIAGNOSIS — G546 Phantom limb syndrome with pain: Secondary | ICD-10-CM | POA: Diagnosis present

## 2023-10-20 DIAGNOSIS — M7702 Medial epicondylitis, left elbow: Secondary | ICD-10-CM | POA: Diagnosis not present

## 2023-10-20 DIAGNOSIS — F909 Attention-deficit hyperactivity disorder, unspecified type: Secondary | ICD-10-CM | POA: Diagnosis present

## 2023-10-20 DIAGNOSIS — Z6824 Body mass index (BMI) 24.0-24.9, adult: Secondary | ICD-10-CM

## 2023-10-20 DIAGNOSIS — D75839 Thrombocytosis, unspecified: Secondary | ICD-10-CM | POA: Diagnosis not present

## 2023-10-20 DIAGNOSIS — G600 Hereditary motor and sensory neuropathy: Secondary | ICD-10-CM | POA: Diagnosis present

## 2023-10-20 DIAGNOSIS — G6181 Chronic inflammatory demyelinating polyneuritis: Secondary | ICD-10-CM | POA: Diagnosis present

## 2023-10-20 DIAGNOSIS — M869 Osteomyelitis, unspecified: Secondary | ICD-10-CM | POA: Diagnosis present

## 2023-10-20 DIAGNOSIS — E1122 Type 2 diabetes mellitus with diabetic chronic kidney disease: Secondary | ICD-10-CM | POA: Diagnosis present

## 2023-10-20 DIAGNOSIS — Z79899 Other long term (current) drug therapy: Secondary | ICD-10-CM

## 2023-10-20 DIAGNOSIS — Z602 Problems related to living alone: Secondary | ICD-10-CM | POA: Diagnosis present

## 2023-10-20 DIAGNOSIS — E1165 Type 2 diabetes mellitus with hyperglycemia: Secondary | ICD-10-CM | POA: Diagnosis present

## 2023-10-20 DIAGNOSIS — N179 Acute kidney failure, unspecified: Secondary | ICD-10-CM | POA: Diagnosis not present

## 2023-10-20 DIAGNOSIS — Z89512 Acquired absence of left leg below knee: Secondary | ICD-10-CM | POA: Diagnosis not present

## 2023-10-20 DIAGNOSIS — K5901 Slow transit constipation: Secondary | ICD-10-CM | POA: Diagnosis not present

## 2023-10-20 DIAGNOSIS — Z888 Allergy status to other drugs, medicaments and biological substances status: Secondary | ICD-10-CM

## 2023-10-20 DIAGNOSIS — Z87891 Personal history of nicotine dependence: Secondary | ICD-10-CM | POA: Diagnosis not present

## 2023-10-20 DIAGNOSIS — Z4781 Encounter for orthopedic aftercare following surgical amputation: Secondary | ICD-10-CM | POA: Diagnosis present

## 2023-10-20 DIAGNOSIS — G8929 Other chronic pain: Secondary | ICD-10-CM | POA: Diagnosis present

## 2023-10-20 DIAGNOSIS — Z823 Family history of stroke: Secondary | ICD-10-CM | POA: Diagnosis not present

## 2023-10-20 DIAGNOSIS — E440 Moderate protein-calorie malnutrition: Secondary | ICD-10-CM | POA: Insufficient documentation

## 2023-10-20 DIAGNOSIS — E559 Vitamin D deficiency, unspecified: Secondary | ICD-10-CM | POA: Diagnosis present

## 2023-10-20 LAB — GLUCOSE, CAPILLARY
Glucose-Capillary: 224 mg/dL — ABNORMAL HIGH (ref 70–99)
Glucose-Capillary: 213 mg/dL — ABNORMAL HIGH (ref 70–99)
Glucose-Capillary: 170 mg/dL — ABNORMAL HIGH (ref 70–99)
Glucose-Capillary: 200 mg/dL — ABNORMAL HIGH (ref 70–99)

## 2023-10-20 NOTE — Plan of Care (Signed)
  Problem: Coping: Goal: Ability to adjust to condition or change in health will improve Outcome: Progressing   Problem: Pain Management: Goal: Pain level will decrease with appropriate interventions Outcome: Progressing   Problem: Activity: Goal: Risk for activity intolerance will decrease Outcome: Progressing

## 2023-10-20 NOTE — Discharge Summary (Signed)
 Physician Discharge Summary  Patient ID: COEN MIYASATO MRN: [id number] DOB/AGE: 1966/05/28 58 y.o.  Admit date: 10/16/2023 Discharge date: 10/20/2023  Admission Diagnoses:  Principal Problem:   S/P BKA (below knee amputation) unilateral, left (HCC) Active Problems:   Subacute osteomyelitis, left ankle and foot (HCC)   Discharge Diagnoses:  Same  Past Medical History:  Diagnosis Date   ADHD (attention deficit hyperactivity disorder)    Anxiety and depression    Candida esophagitis (HCC)    in setting of DKA/hyperglycemia   CIDP (chronic inflammatory demyelinating polyneuropathy) (HCC)    Depression    Diabetes type 2, controlled (HCC)    Diverticulosis    Essential hypertension 06/21/2014   Hyperlipidemia    Kidney stones    Pancreatitis    Stomach ulcer    Vitamin D deficiency     Surgeries: Procedure(s): LEFT BELOW KNEE AMPUTATION APPLICATION, WOUND VAC on 10/16/2023   Consultants:   Discharged Condition: Improved  Hospital Course: MISTER KRAHENBUHL is an 58 y.o. male who was admitted 10/16/2023 with a chief complaint of No chief complaint on file. , and found to have a diagnosis of S/P BKA (below knee amputation) unilateral, left (HCC).  They were brought to the operating room on 10/16/2023 and underwent the above named procedures.    They were given perioperative antibiotics:  Anti-infectives (From admission, onward)    Start     Dose/Rate Route Frequency Ordered Stop   10/16/23 1730  ceFAZolin  (ANCEF ) IVPB 2g/100 mL premix        2 g 200 mL/hr over 30 Minutes Intravenous Every 8 hours 10/16/23 0923 10/17/23 0316   10/16/23 0600  ceFAZolin  (ANCEF ) IVPB 2g/100 mL premix        2 g 200 mL/hr over 30 Minutes Intravenous On call to O.R. 10/16/23 7829 10/16/23 5621     .  They were given compression stockings, early ambulation, and chemoprophylaxis for DVT prophylaxis.  They benefited maximally from their hospital stay and there were no complications.     Recent vital signs:  Vitals:   10/20/23 0449 10/20/23 0751  BP: 92/65 (!) 149/77  Pulse: 79 76  Resp: 17 17  Temp: 99.4 F (37.4 C) 98.4 F (36.9 C)  SpO2: 96% 99%    Recent laboratory studies:  Results for orders placed or performed during the hospital encounter of 10/16/23  Glucose, capillary   Collection Time: 10/16/23  5:49 AM  Result Value Ref Range   Glucose-Capillary 188 (H) 70 - 99 mg/dL   Comment 1 Notify RN   Hemoglobin A1c   Collection Time: 10/16/23  6:17 AM  Result Value Ref Range   Hgb A1c MFr Bld 7.7 (H) 4.8 - 5.6 %   Mean Plasma Glucose 174.29 mg/dL  Prealbumin   Collection Time: 10/16/23  6:18 AM  Result Value Ref Range   Prealbumin 22 18 - 38 mg/dL  CBC WITH DIFFERENTIAL   Collection Time: 10/16/23  6:18 AM  Result Value Ref Range   WBC 6.6 4.0 - 10.5 K/uL   RBC 4.38 4.22 - 5.81 MIL/uL   Hemoglobin 12.8 (L) 13.0 - 17.0 g/dL   HCT 30.8 (L) 65.7 - 84.6 %   MCV 86.3 80.0 - 100.0 fL   MCH 29.2 26.0 - 34.0 pg   MCHC 33.9 30.0 - 36.0 g/dL   RDW 96.2 95.2 - 84.1 %   Platelets 193 150 - 400 K/uL   nRBC 0.0 0.0 - 0.2 %   Neutrophils Relative %  48 %   Neutro Abs 3.2 1.7 - 7.7 K/uL   Lymphocytes Relative 39 %   Lymphs Abs 2.6 0.7 - 4.0 K/uL   Monocytes Relative 7 %   Monocytes Absolute 0.5 0.1 - 1.0 K/uL   Eosinophils Relative 5 %   Eosinophils Absolute 0.3 0.0 - 0.5 K/uL   Basophils Relative 1 %   Basophils Absolute 0.1 0.0 - 0.1 K/uL   Immature Granulocytes 0 %   Abs Immature Granulocytes 0.01 0.00 - 0.07 K/uL  Comprehensive metabolic panel   Collection Time: 10/16/23  6:18 AM  Result Value Ref Range   Sodium 139 135 - 145 mmol/L   Potassium 3.9 3.5 - 5.1 mmol/L   Chloride 103 98 - 111 mmol/L   CO2 27 22 - 32 mmol/L   Glucose, Bld 200 (H) 70 - 99 mg/dL   BUN 11 6 - 20 mg/dL   Creatinine, Ser 9.60 0.61 - 1.24 mg/dL   Calcium  9.3 8.9 - 10.3 mg/dL   Total Protein 6.9 6.5 - 8.1 g/dL   Albumin 3.8 3.5 - 5.0 g/dL   AST 24 15 - 41 U/L   ALT 20  0 - 44 U/L   Alkaline Phosphatase 80 38 - 126 U/L   Total Bilirubin 0.8 0.0 - 1.2 mg/dL   GFR, Estimated >45 >40 mL/min   Anion gap 9 5 - 15  Surgical pathology   Collection Time: 10/16/23  8:00 AM  Result Value Ref Range   SURGICAL PATHOLOGY      SURGICAL PATHOLOGY CASE: [id number] PATIENT: Amey Ka Surgical Pathology Report     Clinical History: osteomyelitis left foot (cm)     FINAL MICROSCOPIC DIAGNOSIS:  A. LEG, LEFT BELOW KNEE, AMPUTATION:      Bone with marked reactive changes without acute osteomyelitis.      Subcutaneous granulation tissue.      Skeletal muscle with atrophic changes and regenerative changes.      Skin with ulcer and hyperparakeratosis.      Severe atherosclerosis with calcifications.      One benign lymph node.      Surgical margin of resection is viable without necrosis or osteomyelitis.   GROSS DESCRIPTION:  Specimen: Received fresh is a clinically left below knee amputation with 4 digits present, the fourth digit is notably absent. Measurements: The leg measures 33 cm in length, the foot measures 23 cm in length, the tibia and fibula extend 2 cm from the soft tissue margin Soft Tissue Margin: Grossly viable Vasculature: The anterior and posterior tibial arte ries are identified and followed revealing minimal amounts of atherosclerosis. Lesions: There is a 2.8 x 1.0 cm area of ulceration on the lateral surface of the foot at upon sectioning reveals minimal softening of the underlying bone. Representative sections are submitted as follows: A1 soft tissue margin A2 anterior tibial artery margin A3 mid and distal anterior tibial artery A4 posterior tibial artery margin A5 mid and distal posterior tibial artery A6 area of ulceration and underlying bone, following decal (KW, 10/16/2023)  Final Diagnosis performed by Dillard Frame, MD.   Electronically signed 10/19/2023 Technical component performed at [employer]. [facility], [address], [place], [place] [address].  Professional component performed at [facility]. [address] [place], [place] [address]  Immunohistochemistry Technical component (if applicable) was performed at [employer]. [address], [place], [place] [address].  IMMUNOHISTOCHEMISTRY DISCLAIMER (if applicable): Some of these immunohistochemical stains may  have been developed and the performance characteristics determine by [facility]. Some may not have been cleared or approved by the U.S. Food and Drug Administration. The FDA has determined that such clearance or approval is not necessary. This test is used for clinical purposes. It should not be regarded as investigational or for research. This laboratory is certified under the Clinical Laboratory Improvement Amendments of 1988 (CLIA-88) as qualified to perform high complexity clinical laboratory testing.  The controls stained appropriately.   Glucose, capillary   Collection Time: 10/16/23  8:33 AM  Result Value Ref Range   Glucose-Capillary 178 (H) 70 - 99 mg/dL  Glucose, capillary   Collection Time: 10/16/23  2:07 PM  Result Value Ref Range   Glucose-Capillary 195 (H) 70 - 99 mg/dL   Comment 1 Notify RN   Glucose, capillary   Collection Time: 10/16/23  4:32 PM  Result Value Ref Range   Glucose-Capillary 223 (H) 70 - 99 mg/dL  Glucose, capillary   Collection Time: 10/16/23  9:13 PM  Result Value Ref Range   Glucose-Capillary 225 (H) 70 - 99 mg/dL  CBC   Collection Time: 10/17/23  3:43 AM  Result Value Ref Range   WBC 9.4 4.0 - 10.5 K/uL   RBC 4.00 (L) 4.22 - 5.81 MIL/uL   Hemoglobin 11.7 (L) 13.0 - 17.0 g/dL   HCT 16.1 (L) 09.6 - 04.5 %   MCV 85.8 80.0 - 100.0 fL   MCH 29.3 26.0 - 34.0 pg   MCHC 34.1 30.0 - 36.0 g/dL   RDW 40.9 81.1 - 91.4 %   Platelets 174 150 - 400 K/uL   nRBC 0.0 0.0 - 0.2 %  Glucose, capillary   Collection Time:  10/17/23  7:28 AM  Result Value Ref Range   Glucose-Capillary 181 (H) 70 - 99 mg/dL  Glucose, capillary   Collection Time: 10/17/23 12:14 PM  Result Value Ref Range   Glucose-Capillary 183 (H) 70 - 99 mg/dL  Glucose, capillary   Collection Time: 10/17/23  5:16 PM  Result Value Ref Range   Glucose-Capillary 249 (H) 70 - 99 mg/dL  Glucose, capillary   Collection Time: 10/17/23  9:39 PM  Result Value Ref Range   Glucose-Capillary 225 (H) 70 - 99 mg/dL  Glucose, capillary   Collection Time: 10/18/23  7:48 AM  Result Value Ref Range   Glucose-Capillary 138 (H) 70 - 99 mg/dL  CBC   Collection Time: 10/18/23  8:55 AM  Result Value Ref Range   WBC 8.0 4.0 - 10.5 K/uL   RBC 3.66 (L) 4.22 - 5.81 MIL/uL   Hemoglobin 10.7 (L) 13.0 - 17.0 g/dL   HCT 78.2 (L) 95.6 - 21.3 %   MCV 86.6 80.0 - 100.0 fL   MCH 29.2 26.0 - 34.0 pg   MCHC 33.8 30.0 - 36.0 g/dL   RDW 08.6 57.8 - 46.9 %   Platelets 157 150 - 400 K/uL   nRBC 0.0 0.0 - 0.2 %  Glucose, capillary   Collection Time: 10/18/23 11:53 AM  Result Value Ref Range   Glucose-Capillary 171 (H) 70 - 99 mg/dL  Glucose, capillary   Collection Time: 10/18/23  4:51 PM  Result Value Ref Range   Glucose-Capillary 176 (H) 70 - 99 mg/dL  Glucose, capillary   Collection Time: 10/18/23  8:35 PM  Result Value Ref Range   Glucose-Capillary 236 (H) 70 - 99 mg/dL  Glucose, capillary   Collection Time: 10/19/23  8:10 AM  Result Value  Ref Range   Glucose-Capillary 211 (H) 70 - 99 mg/dL   Comment 1 Notify RN   Glucose, capillary   Collection Time: 10/19/23  9:24 AM  Result Value Ref Range   Glucose-Capillary 260 (H) 70 - 99 mg/dL  Glucose, capillary   Collection Time: 10/19/23 12:03 PM  Result Value Ref Range   Glucose-Capillary 121 (H) 70 - 99 mg/dL   Comment 1 Notify RN   Glucose, capillary   Collection Time: 10/19/23  4:46 PM  Result Value Ref Range   Glucose-Capillary 195 (H) 70 - 99 mg/dL   Comment 1 Notify RN   Glucose, capillary    Collection Time: 10/19/23  8:13 PM  Result Value Ref Range   Glucose-Capillary 183 (H) 70 - 99 mg/dL  Glucose, capillary   Collection Time: 10/20/23  7:44 AM  Result Value Ref Range   Glucose-Capillary 224 (H) 70 - 99 mg/dL    Discharge Medications:   Allergies as of 10/20/2023       Reactions   Ritalin [methylphenidate] Palpitations, Other (See Comments)   Hyperactivity, also        Medication List     TAKE these medications    amphetamine -dextroamphetamine  30 MG tablet Commonly known as: ADDERALL Take 1 tablet by mouth 2 (two) times daily.   atorvastatin  10 MG tablet Commonly known as: LIPITOR Take 10 mg by mouth daily.   Blood Glucose Monitoring Suppl Devi 1 each by Does not apply route 3 (three) times daily. May dispense any manufacturer covered by patient's insurance.   BLOOD GLUCOSE TEST STRIPS Strp 1 each by Does not apply route 3 (three) times daily. Use as directed to check blood sugar. May dispense any manufacturer covered by patient's insurance and fits patient's device.   Caplyta  42 MG capsule Generic drug: lumateperone  tosylate Take 42 mg by mouth at bedtime.   clonazePAM  1 MG tablet Commonly known as: KLONOPIN  Take 1-1.5 mg by mouth at bedtime.   cyanocobalamin 1000 MCG tablet Commonly known as: VITAMIN B12 Take 2,000 mcg by mouth daily.   Desvenlafaxine ER 100 MG Tb24 Take 100 mg by mouth daily.   fenofibrate  160 MG tablet Take 160 mg by mouth daily.   FreeStyle Libre 14 Day Reader Seymour Dapper every 14 (fourteen) days.   FreeStyle Libre 14 Day Sensor Misc   gabapentin  800 MG tablet Commonly known as: NEURONTIN  Take 800 mg by mouth 3 (three) times daily.   Gvoke HypoPen  2-Pack 1 MG/0.2ML Soaj Generic drug: Glucagon  Inject 1 mg into the skin as needed for up to 2 doses (Severe low blood sugar).   insulin  aspart 100 UNIT/ML FlexPen Commonly known as: NOVOLOG  Inject 0-6 Units into the skin 3 (three) times daily with meals. Check Blood Glucose  (BG) and inject per scale: BG <150= 0 unit; BG 150-200= 1 unit; BG 201-250= 2 unit; BG 251-300= 3 unit; BG 301-350= 4 unit; BG 351-400= 5 unit; BG >400= 6 unit and Call Primary Care. What changed:  how much to take when to take this reasons to take this additional instructions   insulin  glargine 100 UNIT/ML Solostar Pen Commonly known as: LANTUS  Inject 15 Units into the skin 2 (two) times daily. May substitute as needed per insurance. What changed:  how much to take when to take this   JUVEN PO Take 1 Container by mouth 2 (two) times daily.   Lancet Device Misc 1 each by Does not apply route 3 (three) times daily. May dispense any manufacturer covered by  patient's insurance.   Lancets Misc 1 each by Does not apply route 3 (three) times daily. Use as directed to check blood sugar. May dispense any manufacturer covered by patient's insurance and fits patient's device.   lithium  carbonate 150 MG capsule Take 150 mg by mouth at bedtime.   OVER THE COUNTER MEDICATION Take 1,000 mg by mouth at bedtime as needed (sleep/anxiety). CBD gummies   ramipril  2.5 MG capsule Commonly known as: ALTACE  Take 2.5 mg by mouth daily.   TechLite Pen Needles 31G X 8 MM Misc Generic drug: Insulin  Pen Needle USE TO GIVE INSULIN  4 TIMES DAILY   Pen Needles 31G X 5 MM Misc 1 each by Does not apply route 3 (three) times daily. May dispense any manufacturer covered by patient's insurance.   traZODone  50 MG tablet Commonly known as: DESYREL  Take 50-100 mg by mouth at bedtime as needed for sleep.   Vitamin D3 Maximum Strength 125 MCG (5000 UT) capsule Generic drug: Cholecalciferol Take 5,000 Units by mouth daily.        Diagnostic Studies: XR Knee 1-2 Views Left Result Date: 10/08/2023 2 view radiographs of the right knee shows no evidence of a tibial plateau fracture.  CT Angio Chest/Abd/Pel for Dissection W and/or W/WO Result Date: 09/22/2023 CLINICAL DATA:  Acute aortic syndrome suspected  EXAM: CT ANGIOGRAPHY CHEST, ABDOMEN AND PELVIS TECHNIQUE: Non-contrast CT of the chest was initially obtained. Multidetector CT imaging through the chest, abdomen and pelvis was performed using the standard protocol during bolus administration of intravenous contrast. Multiplanar reconstructed images and MIPs were obtained and reviewed to evaluate the vascular anatomy. RADIATION DOSE REDUCTION: This exam was performed according to the departmental dose-optimization program which includes automated exposure control, adjustment of the mA and/or kV according to patient size and/or use of iterative reconstruction technique. CONTRAST:  OMNIPAQUE  IOHEXOL  350 MG/ML SOLN COMPARISON:  CT abdomen and pelvis 02/05/2021. FINDINGS: CTA CHEST FINDINGS Cardiovascular: Preferential opacification of the thoracic aorta. No evidence of thoracic aortic aneurysm or dissection. Normal heart size. No pericardial effusion. Mediastinum/Nodes: No enlarged mediastinal, hilar, or axillary lymph nodes. Thyroid gland, trachea, and esophagus demonstrate no significant findings. Lungs/Pleura: Lungs are clear. No pleural effusion or pneumothorax. Musculoskeletal: No chest wall abnormality. No acute or significant osseous findings. Review of the MIP images confirms the above findings. CTA ABDOMEN AND PELVIS FINDINGS VASCULAR Aorta: Normal caliber aorta without aneurysm, dissection, vasculitis or significant stenosis. There are atherosclerotic calcifications of the aorta. Celiac: Patent without evidence of aneurysm, dissection, vasculitis or significant stenosis. SMA: Patent without evidence of aneurysm, dissection, vasculitis or significant stenosis. Renals: Both renal arteries are patent without evidence of aneurysm, dissection, vasculitis, fibromuscular dysplasia or significant stenosis. IMA: Patent without evidence of aneurysm, dissection, vasculitis or significant stenosis. Inflow: Patent without evidence of aneurysm, dissection,  vasculitis or significant stenosis. Veins: No obvious venous abnormality within the limitations of this arterial phase study. Review of the MIP images confirms the above findings. NON-VASCULAR Hepatobiliary: No focal liver abnormality is seen. No gallstones, gallbladder wall thickening, or biliary dilatation. Pancreas: Unremarkable. No pancreatic ductal dilatation or surrounding inflammatory changes. Spleen: Normal in size without focal abnormality. Adrenals/Urinary Tract: Adrenal glands are unremarkable. Kidneys are normal, without renal calculi, focal lesion, or hydronephrosis. Bladder is unremarkable. Stomach/Bowel: Stomach is within normal limits. Appendix appears normal. No evidence of bowel wall thickening, distention, or inflammatory changes. There are scattered colonic diverticula. Lymphatic: No enlarged lymph nodes are seen. Reproductive: Prostate is unremarkable. Other: There is a small fat  containing left inguinal hernia. There is no ascites. Musculoskeletal: No acute or significant osseous findings. Review of the MIP images confirms the above findings. IMPRESSION: 1. No evidence for aortic aneurysm or dissection. 2. No acute localizing process in the chest, abdomen or pelvis. 3. Colonic diverticulosis without evidence for diverticulitis. 4. Small fat containing left inguinal hernia. Electronically Signed   By: Tyron Gallon M.D.   On: 09/22/2023 19:29   DG Chest 2 View Result Date: 09/22/2023 CLINICAL DATA:  Chest pain. EXAM: CHEST - 2 VIEW COMPARISON:  May 06, 2018. FINDINGS: The heart size and mediastinal contours are within normal limits. Stable probable left lingular scarring. No acute pulmonary disease is noted. Status post surgical internal fixation of old left clavicular fracture. IMPRESSION: No active cardiopulmonary disease. Electronically Signed   By: Rosalene Colon M.D.   On: 09/22/2023 17:59    Disposition: Discharge disposition: 02-Transferred to [facility]       Discharge Instructions     Call MD / Call 911   Complete by: As directed    If you experience chest pain or shortness of breath, CALL 911 and be transported to the hospital emergency room.  If you develope a fever above 101 F, pus (white drainage) or increased drainage or redness at the wound, or calf pain, call your surgeon's office.   Constipation Prevention   Complete by: As directed    Drink plenty of fluids.  Prune juice may be helpful.  You may use a stool softener, such as Colace (over the counter) 100 mg twice a day.  Use MiraLax  (over the counter) for constipation as needed.   Diet - low sodium heart healthy   Complete by: As directed    Increase activity slowly as tolerated   Complete by: As directed    Negative Pressure Wound Therapy - Incisional   Complete by: As directed    Post-operative opioid taper instructions:   Complete by: As directed    POST-OPERATIVE OPIOID TAPER INSTRUCTIONS: It is important to wean off of your opioid medication as soon as possible. If you do not need pain medication after your surgery it is ok to stop day one. Opioids include: Codeine, Hydrocodone (Norco, Vicodin), Oxycodone (Percocet, oxycontin ) and hydromorphone  amongst others.  Long term and even short term use of opiods can cause: Increased pain response Dependence Constipation Depression Respiratory depression And more.  Withdrawal symptoms can include Flu like symptoms Nausea, vomiting And more Techniques to manage these symptoms Hydrate well Eat regular healthy meals Stay active Use relaxation techniques(deep breathing, meditating, yoga) Do Not substitute Alcohol to help with tapering If you have been on opioids for less than two weeks and do not have pain than it is ok to stop all together.  Plan to wean off of opioids This plan should start within one week post op of your joint replacement. Maintain the same interval or time between taking each dose and  first decrease the dose.  Cut the total daily intake of opioids by one tablet each day Next start to increase the time between doses. The last dose that should be eliminated is the evening dose.           Follow-up Information     Timothy Ford, MD Follow up in 1 week(s).   Specialty: Orthopedic Surgery Contact information: [address] [place]  [place] [address] [phone]                  Signed: Marshia Skene  Julio Ohm 10/20/2023, 10:57 AM

## 2023-10-20 NOTE — Progress Notes (Signed)
 Celia Coles, MD  Physician Physical Medicine and Rehabilitation   PMR Pre-admission    Signed   Date of Service: 10/17/2023  4:40 PM  Related encounter: Admission (Current) from 10/16/2023 in [facility] 6 [facility]  SURGICAL   Signed     Expand All Collapse All  Show:Clear all [x] Written[x] Templated[] Copied  Added by: [x] Dorenda Gandy, RN[x] Amiel Kalata, CCC-SLP[x] Celia Coles, MD  [] Hover for details PMR Admission Coordinator Pre-Admission Assessment   Patient: [name] is an 58 y.o., male MRN: [id number] DOB: 12-08-1965 Height: 6\' 4"  (193 cm) Weight: 97.5 kg   Insurance Information HMO:     PPO:      PCP:      IPA:      80/20:      OTHER:  PRIMARY: [place] Medicaid Healthy Blue      Policy#: [id number]      Subscriber: patient CM Name: faxed approval      Phone#: [phone]     Fax#: [phone] concurrent review [phone] Pre-Cert#: [id number] approved 5/20 until 5/29     Employer:  Benefits:  Phone #: [phone]     Name:  Eff. Date: 04/03/23-03/31/24     Deduct: $0-does not have one      Out of Pocket Max: $0-does not have one      Life Max: NA CIR: 100% coverage      SNF: 100% coverage Outpatient: $4 co-pay/visit     Co-Pay:  Home Health: 100% coverage      Co-Pay:  DME: 100% coverage     Co-Pay:  Providers: in-network SECONDARY:       Policy#:      Phone#:    Artist:       Phone#:    The Data processing manager" for patients in Inpatient Rehabilitation Facilities with attached "Privacy Act Statement-Health Care Records" was provided and verbally reviewed with: Patient   Emergency Contact Information Contact Information       Name Relation Home Work Mobile    [place] Sister     [phone]    Jaquin, Coy     [phone]    Pryor, Guettler     [phone]    Donelan,Lucy Daughter     [phone]         Other Contacts   None on File        Current  Medical History  Patient Admitting Diagnosis: Left BKA   History of Present Illness: Pt is a 58 year old male with medical hx significant for: fifth and partial fourth ray amputation, neuropathy, DM II, diverticulosis, HTN, hyperlipidemia, pancreatitis.  Pt presented to [facility] on 10/16/23 for planned left BKA.    Pt underwent left BKA and application of wound VAC by Dr. Julio Ohm on 10/16/23. Therapy evaluations completed and CIR recommended d/t pt's deficits in functional mobility.    Patient[facility] has been reviewed by the rehabilitation admission coordinator and physician.   Past Medical History      Past Medical History:  Diagnosis Date   ADHD (attention deficit hyperactivity disorder)     Anxiety and depression     Candida esophagitis (HCC)      in setting of DKA/hyperglycemia   CIDP (chronic inflammatory demyelinating polyneuropathy) (HCC)     Depression     Diabetes type 2, controlled (HCC)     Diverticulosis     Essential hypertension 06/21/2014   Hyperlipidemia  Kidney stones     Pancreatitis     Stomach ulcer     Vitamin D deficiency          Has the patient had major surgery during 100 days prior to admission? Yes   Family History   family history includes Breast cancer in his mother; Stroke in his father.   Current Medications  Current Medications    Current Facility-Administered Medications:    0.9 %  sodium chloride  infusion, , Intravenous, Continuous, Timothy Ford, MD   acetaminophen  (TYLENOL ) tablet 325-650 mg, 325-650 mg, Oral, Q6H PRN, Duda, Marcus V, MD, 325 mg at 10/19/23 1521   alum & mag hydroxide-simeth (MAALOX/MYLANTA) 200-200-20 MG/5ML suspension 15-30 mL, 15-30 mL, Oral, Q2H PRN, Duda, Marcus V, MD   amphetamine -dextroamphetamine  (ADDERALL) tablet 30 mg, 30 mg, Oral, BID, Duda, Marcus V, MD, 30 mg at 10/19/23 1610   ascorbic acid  (VITAMIN C ) tablet 1,000 mg, 1,000 mg, Oral, Daily, Duda, Marcus V, MD,  1,000 mg at 10/19/23 9604   atorvastatin  (LIPITOR) tablet 10 mg, 10 mg, Oral, Daily, Duda, Marcus V, MD, 10 mg at 10/19/23 5409   bisacodyl  (DULCOLAX) EC tablet 5 mg, 5 mg, Oral, Daily PRN, Duda, Marcus V, MD   clonazePAM  (KLONOPIN ) tablet 1 mg, 1 mg, Oral, QHS, Duda, Marcus V, MD, 1 mg at 10/18/23 2244   docusate sodium  (COLACE) capsule 100 mg, 100 mg, Oral, Daily, Duda, Marcus V, MD, 100 mg at 10/19/23 8119   fenofibrate  tablet 160 mg, 160 mg, Oral, Daily, Duda, Marcus V, MD, 160 mg at 10/19/23 1478   gabapentin  (NEURONTIN ) capsule 800 mg, 800 mg, Oral, TID, Duda, Marcus V, MD, 800 mg at 10/19/23 1521   guaiFENesin -dextromethorphan  (ROBITUSSIN DM) 100-10 MG/5ML syrup 15 mL, 15 mL, Oral, Q4H PRN, Duda, Marcus V, MD, 15 mL at 10/18/23 2244   hydrALAZINE  (APRESOLINE ) injection 5 mg, 5 mg, Intravenous, Q20 Min PRN, Timothy Ford, MD   HYDROmorphone  (DILAUDID ) injection 0.5-1 mg, 0.5-1 mg, Intravenous, Q4H PRN, Duda, Marcus V, MD, 1 mg at 10/18/23 1319   insulin  aspart (novoLOG ) injection 0-15 Units, 0-15 Units, Subcutaneous, TID WC, Duda, Marcus V, MD, 2 Units at 10/19/23 1350   insulin  aspart (novoLOG ) injection 4 Units, 4 Units, Subcutaneous, TID WC, Duda, Marcus V, MD, 4 Units at 10/19/23 1343   insulin  glargine-yfgn (SEMGLEE ) injection 40 Units, 40 Units, Subcutaneous, QHS, Duda, Marcus V, MD, 40 Units at 10/18/23 2244   labetalol  (NORMODYNE ) injection 10 mg, 10 mg, Intravenous, Q10 min PRN, Duda, Marcus V, MD   lithium  carbonate capsule 150 mg, 150 mg, Oral, QHS, Duda, Marcus V, MD, 150 mg at 10/18/23 2243   lumateperone  tosylate (CAPLYTA ) capsule 42 mg, 42 mg, Oral, QHS, Duda, Marcus V, MD, 42 mg at 10/18/23 2243   magnesium  citrate solution 1 Bottle, 1 Bottle, Oral, Once PRN, Duda, Marcus V, MD   magnesium  sulfate IVPB 2 g 50 mL, 2 g, Intravenous, Daily PRN, Duda, Marcus V, MD   metoprolol  tartrate (LOPRESSOR ) injection 2-5 mg, 2-5 mg, Intravenous, Q2H PRN, Duda, Marcus V, MD   nutrition  supplement (JUVEN) (JUVEN) powder packet 1 packet, 1 packet, Oral, BID BM, Timothy Ford, MD, 1 packet at 10/19/23 1343   ondansetron  (ZOFRAN ) injection 4 mg, 4 mg, Intravenous, Q6H PRN, Duda, Marcus V, MD, 4 mg at 10/18/23 2234   oxyCODONE  (Oxy IR/ROXICODONE ) immediate release tablet 10-15 mg, 10-15 mg, Oral, Q4H PRN, Duda, Marcus V, MD, 10 mg at 10/19/23 1522   oxyCODONE  (  Oxy IR/ROXICODONE ) immediate release tablet 5-10 mg, 5-10 mg, Oral, Q4H PRN, Duda, Marcus V, MD, 10 mg at 10/18/23 2243   pantoprazole  (PROTONIX ) EC tablet 40 mg, 40 mg, Oral, Daily, Duda, Marcus V, MD, 40 mg at 10/19/23 0925   phenol (CHLORASEPTIC) mouth spray 1 spray, 1 spray, Mouth/Throat, PRN, Timothy Ford, MD   polyethylene glycol (MIRALAX  / GLYCOLAX ) packet 17 g, 17 g, Oral, Daily PRN, Duda, Marcus V, MD, 17 g at 10/19/23 0865   potassium chloride  SA (KLOR-CON  M) CR tablet 20-40 mEq, 20-40 mEq, Oral, Daily PRN, Duda, Marcus V, MD   traZODone  (DESYREL ) tablet 50-100 mg, 50-100 mg, Oral, QHS PRN, Duda, Marcus V, MD   venlafaxine  XR (EFFEXOR -XR) 24 hr capsule 75 mg, 75 mg, Oral, Q breakfast, Duda, Marcus V, MD, 75 mg at 10/19/23 7846   zinc  sulfate (50mg  elemental zinc ) capsule 220 mg, 220 mg, Oral, Daily, Duda, Marcus V, MD, 220 mg at 10/19/23 9629     Patients Current Diet:  Diet Order                  Diet Carb Modified Fluid consistency: Thin; Room service appropriate? Yes  Diet effective now                       Precautions / Restrictions Precautions Precautions: Fall Restrictions Weight Bearing Restrictions Per Provider Order: No    Has the patient had 2 or more falls or a fall with injury in the past year? Yes   Prior Activity Level Limited Community (1-2x/wk): gets out of house ~1-2 days/week   Prior Functional Level Self Care: Did the patient need help bathing, dressing, using the toilet or eating? Independent   Indoor Mobility: Did the patient need assistance with walking from room to room  (with or without device)? Independent   Stairs: Did the patient need assistance with internal or external stairs (with or without device)? Independent   Functional Cognition: Did the patient need help planning regular tasks such as shopping or remembering to take medications? Independent   Patient Information Are you of Hispanic, Latino/a,or Spanish origin?: A. No, not of Hispanic, Latino/a, or Spanish origin What is your race?: A. White Do you need or want an interpreter to communicate with a doctor or health care staff?: 0. No   Patient's Response To:  Health Literacy and Transportation Is the patient able to respond to health literacy and transportation needs?: Yes Health Literacy - How often do you need to have someone help you when you read instructions, pamphlets, or other written material from your doctor or pharmacy?: Never In the past 12 months, has lack of transportation kept you from medical appointments or from getting medications?: No In the past 12 months, has lack of transportation kept you from meetings, work, or from getting things needed for daily living?: No   Home Assistive Devices / Equipment Home Equipment: Other (comment), Rolling Walker (2 wheels), Cane - single point   Prior Device Use: Indicate devices/aids used by the patient prior to current illness, exacerbation or injury? Cane, knee scooter   Current Functional Level Cognition   Orientation Level: Oriented X4    Extremity Assessment (includes Sensation/Coordination)   Upper Extremity Assessment: Overall WFL for tasks assessed  Lower Extremity Assessment: Defer to PT evaluation     ADLs   Overall ADL's : Needs assistance/impaired Eating/Feeding: Set up, Sitting Grooming: Wash/dry hands, Contact guard assist, Standing Grooming Details (indicate cue type and  reason): CGA for balance while standing Upper Body Dressing : Set up, Sitting Lower Body Dressing: Contact guard assist, Sit to/from stand Lower  Body Dressing Details (indicate cue type and reason): Adequate standing balance Toilet Transfer: Contact guard assist, Ambulation, Rolling walker (2 wheels) Toilet Transfer Details (indicate cue type and reason): CGA for balance able to hop into bathroom with RW Toileting- Clothing Manipulation and Hygiene: Contact guard assist, Sit to/from stand Toileting - Clothing Manipulation Details (indicate cue type and reason): Adequate standing balance Functional mobility during ADLs: Contact guard assist, Rolling walker (2 wheels)     Mobility   Overal bed mobility: Needs Assistance Bed Mobility: Supine to Sit Supine to sit: Supervision General bed mobility comments: Pt received in recliner     Transfers   Overall transfer level: Needs assistance Equipment used: Rolling walker (2 wheels) Transfers: Sit to/from Stand, Bed to chair/wheelchair/BSC Sit to Stand: Contact guard assist Bed to/from chair/wheelchair/BSC transfer type:: Step pivot Step pivot transfers: Contact guard assist General transfer comment: CGA, cues for sequencing     Ambulation / Gait / Stairs / Wheelchair Mobility         Posture / Balance Balance Overall balance assessment: Needs assistance Sitting-balance support: Bilateral upper extremity supported, Feet supported Sitting balance-Leahy Scale: Good Standing balance support: Bilateral upper extremity supported, During functional activity, Reliant on assistive device for balance Standing balance-Leahy Scale: Fair Standing balance comment: Able to complete dynamic task, no LOB     Special needs/care consideration Wound VAC to surgical site      Previous Home Environment  Living Arrangements: Alone Available Help at Discharge: Family, Friend(s), Available PRN/intermittently Type of Home: House Home Layout: One level Home Access: Stairs to enter Entrance Stairs-Rails: None Entrance Stairs-Number of Steps: 2 Bathroom Shower/Tub: Museum/gallery conservator: Yes How Accessible: Accessible via walker Home Care Services: No Additional Comments: knee scooter, has been on it for the last 6 months   Discharge Living Setting Plans for Discharge Living Setting: Patient's home Type of Home at Discharge: House Discharge Home Layout: One level Discharge Home Access: Stairs to enter Entrance Stairs-Rails: None Entrance Stairs-Number of Steps: 2 Discharge Bathroom Shower/Tub: Tub/shower unit Discharge Bathroom Toilet: Standard Discharge Bathroom Accessibility: Yes How Accessible: Accessible via walker Does the patient have any problems obtaining your medications?: No   Social/Family/Support Systems Anticipated Caregiver: Trong Gosling, sister Anticipated Caregiver's Contact Information: [phone] Caregiver Availability: 24/7 Discharge Plan Discussed with Primary Caregiver:  (pt preferred to discuss CIR goals and expectations with sister) Is Caregiver In Agreement with Plan?: Yes Does Caregiver/Family have Issues with Lodging/Transportation while Pt is in Rehab?: No   Sister, [name], from [place] , to come stay with him at discharge initially   Goals Patient/Family Goal for Rehab: Mod I-Supervision: PT/OT Expected length of stay: 5-7 days Pt/Family Agrees to Admission and willing to participate: Yes Program Orientation Provided & Reviewed with Pt/Caregiver Including Roles  & Responsibilities: Yes   Decrease burden of Care through IP rehab admission: NA   Possible need for SNF placement upon discharge: Not anticipated   Patient Condition: I have reviewed medical records from [facility], spoken with CM, and patient. I met with patient at the bedside for inpatient rehabilitation assessment.  Patient will benefit from ongoing PT and OT, can actively participate in 3 hours of therapy a day 5 days of the week, and can make measurable gains during the admission.  Patient will also benefit from the  coordinated team  approach during an Inpatient Acute Rehabilitation admission.  The patient will receive intensive therapy as well as Rehabilitation physician, nursing, social worker, and care management interventions.  Due to safety, skin/wound care, disease management, medication administration, pain management, and patient education the patient requires 24 hour a day rehabilitation nursing.  The patient is currently min assist overall with mobility and basic ADLs.  Discharge setting and therapy post discharge at home with home health is anticipated.  Patient has agreed to participate in the Acute Inpatient Rehabilitation Program and will admit today.   Preadmission Screen Completed By:  Amiel Kalata, 10/19/2023 3:39 PM ______________________________________________________________________   Discussed status with Dr. Raynaldo Call on 10/20/23 at 1050 and received approval for admission today.   Admission Coordinator:  Amiel Kalata, CCC-SLP, time 1050 Date 10/20/23    Assessment/Plan: Diagnosis:  L BKA Does the need for close, 24 hr/day Medical supervision in concert with the patient's rehab needs make it unreasonable for this patient to be served in a less intensive setting? Yes Co-Morbidities requiring supervision/potential complications: DM, peripehral neuropathy, Diverticulosis, HTN, Pancreatitis, HLD, osteomyelitis of L foot Due to bladder management, bowel management, safety, skin/wound care, disease management, medication administration, pain management, and patient education, does the patient require 24 hr/day rehab nursing? Yes Does the patient require coordinated care of a physician, rehab nurse, PT, OT to address physical and functional deficits in the context of the above medical diagnosis(es)? Yes Addressing deficits in the following areas: balance, endurance, locomotion, strength, transferring, bowel/bladder control, bathing, dressing, feeding, grooming, and toileting Can the  patient actively participate in an intensive therapy program of at least 3 hrs of therapy 5 days a week? Yes The potential for patient to make measurable gains while on inpatient rehab is good Anticipated functional outcomes upon discharge from inpatient rehab: modified independent and supervision PT, modified independent and supervision OT, n/a SLP Estimated rehab length of stay to reach the above functional goals is: 5-7 days Anticipated discharge destination: Home 10. Overall Rehab/Functional Prognosis: good     MD Signature:            Revision History

## 2023-10-20 NOTE — Progress Notes (Signed)
 Patient ID: [name], male   DOB: 02/01/1966, 58 y.o.   MRN: [id number] Patient is status post transtibial amputation.  There is no drainage in the wound VAC canister.  Patient states that occasionally he has a sharp shooting pain but usually has no pain.  Patient awaiting insurance authorization for inpatient rehab.

## 2023-10-20 NOTE — Progress Notes (Signed)
 Patient has some pink/redness at top of wound vac site that has been itching and irritating at times. Noticed today that it has spread a little higher up his left thigh. Dr. Julio Ohm notified. Most likely irritation from adhesive on wound vac dressing.

## 2023-10-20 NOTE — Progress Notes (Signed)
 Patient wheeled  to 4W RM 23 per wheelchair. Wound vac in place. Patient belongings with patient.

## 2023-10-20 NOTE — H&P (Signed)
 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 Demetria Finch), IV antibiotics and hyperbaric tx ([facility]). He has had progressive breakdown ulceration to the bone and pain left knee. He was evaluated by Dr. Julio Ohm who recommended amputation. He was admitted to [facility] on 10/16/23 for L-BKA and has been participating in PT/OT.  He lives alone and was independent PTA with use of scooter due to NWB LLE. He currently requires min to CGA and CIR recommended due to functional decline.   Pt reports has been on gabapentin  for "years" and just not enough right now- taking Oxy 2-3x/day, and usually taking 10 mg.  Been having low grade temps- was 102.8 the 'other' day- but Is concerned because having more of a cough.  Pain 7-8/10 at rest since block has worn off- but spikes to "10+/10" at time with electrical lightening that goes through his L residual limb.   Says his mood is MUCH better since they added Lithium - feels like it's made the most difference than anything in his whole life- "wished he had it 30-40 years ago".   LBM 6 days ago, but doesn't feel bloated.   Review of Systems  Constitutional:  Negative for chills and fever.  HENT:  Negative for hearing loss and tinnitus.   Eyes:  Negative for blurred vision.  Cardiovascular:  Negative for chest pain and palpitations.  Gastrointestinal:  Positive for constipation. Negative for heartburn and nausea.  Genitourinary:  Positive for frequency and urgency.  Musculoskeletal:  Positive for myalgias.  Neurological:  Positive for sensory change (numbness BLE and now right and left hand "cant open a bag of chips") and weakness. Negative for dizziness and headaches.   Psychiatric/Behavioral:  The patient is nervous/anxious and has insomnia.   All other systems reviewed and are negative.    Past Medical History:  Diagnosis Date   ADHD (attention deficit hyperactivity disorder)    Anxiety and depression    Candida esophagitis (HCC)    in setting of DKA/hyperglycemia   CIDP (chronic inflammatory demyelinating polyneuropathy) (HCC)    Depression    Diabetes type 2, controlled (HCC)    Diverticulosis    Essential hypertension 06/21/2014   Hyperlipidemia    Kidney stones    Pancreatitis    Stomach ulcer    Vitamin D deficiency     Past Surgical History:  Procedure Laterality Date   AMPUTATION Left 10/16/2023   Procedure: LEFT BELOW KNEE AMPUTATION;  Surgeon: Timothy Ford, MD;  Location: [facility] OR;  Service: Orthopedics;  Laterality: Left;  LEFT BELOW KNEE AMPUTATION   ANKLE SURGERY     APPLICATION OF WOUND VAC Left 10/16/2023   Procedure: APPLICATION, WOUND VAC;  Surgeon: Timothy Ford, MD;  Location: [facility] OR;  Service: Orthopedics;  Laterality: Left;   CERVICAL DISC SURGERY     C4-C5   ELBOW SURGERY     ESOPHAGOGASTRODUODENOSCOPY N/A 07/16/2017   Procedure: ESOPHAGOGASTRODUODENOSCOPY (EGD);  Surgeon: Kenney Peacemaker, MD;  Location: [facility] ENDOSCOPY;  Service: Endoscopy;  Laterality: N/A;   IRRIGATION AND DEBRIDEMENT FOOT Left 07/31/2023   Procedure: IRRIGATION AND DEBRIDEMENT FOOT AND FIFTH MET RESCTION;  Surgeon: Evertt Hoe, DPM;  Location:  [facility] OR;  Service: Orthopedics/Podiatry;  Laterality: Left;   RETINAL DETACHMENT SURGERY     RHINOPLASTY     VASECTOMY     WOUND DEBRIDEMENT Left 03/27/2018   Procedure: DEBRIDEMENT WOUND;  Surgeon: Charity Conch, DPM;  Location: [facility] OR;  Service: Podiatry;  Laterality: Left;    Family History  Problem Relation Age of Onset   Stroke Father    Breast cancer Mother    Colon cancer Neg Hx     Social History: Lives alone and independent PTA. He  reports that he has quit smoking. He has quit  using smokeless tobacco. He reports that he does not currently use alcohol. He reports that he does not use drugs.    Allergies  Allergen Reactions   Ritalin [Methylphenidate] Palpitations and Other (See Comments)    Hyperactivity, also    Medications Prior to Admission  Medication Sig Dispense Refill   amphetamine -dextroamphetamine  (ADDERALL) 30 MG tablet Take 1 tablet by mouth 2 (two) times daily.     atorvastatin  (LIPITOR) 10 MG tablet Take 10 mg by mouth daily.     Cholecalciferol (VITAMIN D3 MAXIMUM STRENGTH) 125 MCG (5000 UT) capsule Take 5,000 Units by mouth daily.     clonazePAM  (KLONOPIN ) 1 MG tablet Take 1-1.5 mg by mouth at bedtime.     cyanocobalamin (VITAMIN B12) 1000 MCG tablet Take 2,000 mcg by mouth daily.     Desvenlafaxine ER 100 MG TB24 Take 100 mg by mouth daily.     fenofibrate  160 MG tablet Take 160 mg by mouth daily.     gabapentin  (NEURONTIN ) 800 MG tablet Take 800 mg by mouth 3 (three) times daily.     Glucagon  (GVOKE HYPOPEN  2-PACK) 1 MG/0.2ML SOAJ Inject 1 mg into the skin as needed for up to 2 doses (Severe low blood sugar). 0.4 mL 0   insulin  aspart (NOVOLOG ) 100 UNIT/ML FlexPen Inject 0-6 Units into the skin 3 (three) times daily with meals. Check Blood Glucose (BG) and inject per scale: BG <150= 0 unit; BG 150-200= 1 unit; BG 201-250= 2 unit; BG 251-300= 3 unit; BG 301-350= 4 unit; BG 351-400= 5 unit; BG >400= 6 unit and Call Primary Care. (Patient taking differently: Inject 8-24 Units into the skin 3 (three) times daily as needed for high blood sugar.) 15 mL 0   insulin  glargine (LANTUS ) 100 UNIT/ML Solostar Pen Inject 15 Units into the skin 2 (two) times daily. May substitute as needed per insurance. (Patient taking differently: Inject 45 Units into the skin at bedtime. May substitute as needed per insurance.) 15 mL 0   lithium  carbonate 150 MG capsule Take 150 mg by mouth at bedtime.     lumateperone  tosylate (CAPLYTA ) 42 MG capsule Take 42 mg by mouth at  bedtime.     Nutritional Supplements (JUVEN PO) Take 1 Container by mouth 2 (two) times daily.     OVER THE COUNTER MEDICATION Take 1,000 mg by mouth at bedtime as needed (sleep/anxiety). CBD gummies     traZODone  (DESYREL ) 50 MG tablet Take 50-100 mg by mouth at bedtime as needed for sleep.     Blood Glucose Monitoring Suppl DEVI 1 each by Does not apply route 3 (three) times daily. May dispense any manufacturer covered by patient's insurance. 1 each 0   Continuous Blood Gluc Receiver (FREESTYLE LIBRE 14 DAY READER) DEVI every 14 (fourteen) days.     Continuous Blood Gluc Sensor (FREESTYLE LIBRE 14 DAY SENSOR) MISC   2   Glucose  Blood (BLOOD GLUCOSE TEST STRIPS) STRP 1 each by Does not apply route 3 (three) times daily. Use as directed to check blood sugar. May dispense any manufacturer covered by patient's insurance and fits patient's device. 100 strip 0   Insulin  Pen Needle (PEN NEEDLES) 31G X 5 MM MISC 1 each by Does not apply route 3 (three) times daily. May dispense any manufacturer covered by patient's insurance. 100 each 0   Lancet Device MISC 1 each by Does not apply route 3 (three) times daily. May dispense any manufacturer covered by patient's insurance. 1 each 0   Lancets MISC 1 each by Does not apply route 3 (three) times daily. Use as directed to check blood sugar. May dispense any manufacturer covered by patient's insurance and fits patient's device. 100 each 0   ramipril  (ALTACE ) 2.5 MG capsule Take 2.5 mg by mouth daily.     TECHLITE PEN NEEDLES 31G X 8 MM MISC USE TO GIVE INSULIN  4 TIMES DAILY        Home: Home Living Family/patient expects to be discharged to:: Private residence Living Arrangements: Alone Available Help at Discharge: Family, Friend(s), Available PRN/intermittently Type of Home: House Home Access: Stairs to enter Secretary/administrator of Steps: 2 Entrance Stairs-Rails: None Home Layout: One level Bathroom Shower/Tub: Engineer, manufacturing systems:  Standard Bathroom Accessibility: Yes Home Equipment: Other (comment), Rolling Walker (2 wheels), Cane - single point Additional Comments: knee scooter, has been on it for the last 6 months   Functional History: Prior Function Prior Level of Function : Independent/Modified Independent  Functional Status:  Mobility: Bed Mobility Overal bed mobility: Needs Assistance Bed Mobility: Supine to Sit, Sit to Supine Supine to sit: Supervision Sit to supine: Supervision General bed mobility comments: Pt received in recliner Transfers Overall transfer level: Needs assistance Equipment used: Rolling walker (2 wheels) Transfers: Sit to/from Stand Sit to Stand: Min assist Bed to/from chair/wheelchair/BSC transfer type:: Step pivot Step pivot transfers: Contact guard assist General transfer comment: light rise and steady assist from low surface. stand x2 from EOB, first attempt letting go of RW with L hand with LOB and poor eccentric lower back onto EOB. Ambulation/Gait Ambulation/Gait assistance: Contact guard assist Gait Distance (Feet): 75 Feet Assistive device: Rolling walker (2 wheels) Gait Pattern/deviations: Step-to pattern General Gait Details: hop-to pattern with mod cuing for sequencing, placement of RW, placement of R foot within RW, and alignment of LLE as pt with tendency for hip flexion and abduction. Gait velocity: decr    ADL: ADL Overall ADL's : Needs assistance/impaired Eating/Feeding: Set up, Sitting Grooming: Wash/dry hands, Contact guard assist, Standing Grooming Details (indicate cue type and reason): CGA for balance while standing Upper Body Dressing : Set up, Sitting Lower Body Dressing: Contact guard assist, Sit to/from stand Lower Body Dressing Details (indicate cue type and reason): Adequate standing balance Toilet Transfer: Contact guard assist, Ambulation, Rolling walker (2 wheels) Toilet Transfer Details (indicate cue type and reason): CGA for balance able to  hop into bathroom with RW Toileting- Clothing Manipulation and Hygiene: Contact guard assist, Sit to/from stand Toileting - Clothing Manipulation Details (indicate cue type and reason): Adequate standing balance Functional mobility during ADLs: Contact guard assist, Rolling walker (2 wheels)  Cognition: Cognition Orientation Level: Oriented X4 Cognition Arousal: Alert Behavior During Therapy: WFL for tasks assessed/performed   Blood pressure (!) 149/77, pulse 76, temperature 98.4 F (36.9 C), temperature source Oral, resp. rate 17, height 6\' 4"  (1.93 m), weight 97.5 kg, SpO2 99%. Physical Exam  Vitals and nursing note reviewed. Exam conducted with a chaperone present.  Constitutional:      Appearance: Normal appearance. He is normal weight.     Comments: Verbose- friends in room; awake, alert, tangential and hard ot stay on topic; squirming due to pain; NAD   HENT:     Head: Normocephalic and atraumatic.     Right Ear: External ear normal.     Left Ear: External ear normal.     Nose: Nose normal. No congestion.     Mouth/Throat:     Mouth: Mucous membranes are dry.     Pharynx: Oropharynx is clear. No oropharyngeal exudate.  Eyes:     General:        Right eye: No discharge.        Left eye: No discharge.     Extraocular Movements: Extraocular movements intact.  Cardiovascular:     Rate and Rhythm: Normal rate and regular rhythm.     Heart sounds: Normal heart sounds. No murmur heard.    No gallop.  Pulmonary:     Effort: Pulmonary effort is normal. No respiratory distress.     Breath sounds: Normal breath sounds. No wheezing, rhonchi or rales.  Abdominal:     General: There is distension.     Palpations: Abdomen is soft.     Tenderness: There is no abdominal tenderness.     Comments: Very hypoactive even after finished lunch  Musculoskeletal:     Cervical back: Neck supple. No tenderness.     Comments: Ue's 5/5 B/L RLE- HF 5/5; KE/KF 5-/5; DF 2+/5 and PF 4/5 LLE- HF  5/5; KE/KF limited by pain- at least 3/5 L BKA in VAC with shrinker-  Skin:    General: Skin is warm and dry.     Comments: Lesion on right lateral malleolus --clean and dry but tends to drain at times. Multiple scabs on right shin. Left hand with NSL and erythema surrounding it. Knobby area below patella on R knee- osgood schlotters ?  Neurological:     Mental Status: He is alert and oriented to person, place, and time.     Comments: Decreased to light touch below mid calf on RLE and tip of L BKA  Psychiatric:     Comments: Slightly manic?- tangential  versus a little hyper- and almost pressured speech at times- brighter affect     Results for orders placed or performed during the hospital encounter of 10/16/23 (from the past 48 hours)  Glucose, capillary     Status: Abnormal   Collection Time: 10/18/23 11:53 AM  Result Value Ref Range   Glucose-Capillary 171 (H) 70 - 99 mg/dL    Comment: Glucose reference range applies only to samples taken after fasting for at least 8 hours.  Glucose, capillary     Status: Abnormal   Collection Time: 10/18/23  4:51 PM  Result Value Ref Range   Glucose-Capillary 176 (H) 70 - 99 mg/dL    Comment: Glucose reference range applies only to samples taken after fasting for at least 8 hours.  Glucose, capillary     Status: Abnormal   Collection Time: 10/18/23  8:35 PM  Result Value Ref Range   Glucose-Capillary 236 (H) 70 - 99 mg/dL    Comment: Glucose reference range applies only to samples taken after fasting for at least 8 hours.  Glucose, capillary     Status: Abnormal   Collection Time: 10/19/23  8:10 AM  Result Value Ref Range  Glucose-Capillary 211 (H) 70 - 99 mg/dL    Comment: Glucose reference range applies only to samples taken after fasting for at least 8 hours.   Comment 1 Notify RN   Glucose, capillary     Status: Abnormal   Collection Time: 10/19/23  9:24 AM  Result Value Ref Range   Glucose-Capillary 260 (H) 70 - 99 mg/dL    Comment:  Glucose reference range applies only to samples taken after fasting for at least 8 hours.  Glucose, capillary     Status: Abnormal   Collection Time: 10/19/23 12:03 PM  Result Value Ref Range   Glucose-Capillary 121 (H) 70 - 99 mg/dL    Comment: Glucose reference range applies only to samples taken after fasting for at least 8 hours.   Comment 1 Notify RN   Glucose, capillary     Status: Abnormal   Collection Time: 10/19/23  4:46 PM  Result Value Ref Range   Glucose-Capillary 195 (H) 70 - 99 mg/dL    Comment: Glucose reference range applies only to samples taken after fasting for at least 8 hours.   Comment 1 Notify RN   Glucose, capillary     Status: Abnormal   Collection Time: 10/19/23  8:13 PM  Result Value Ref Range   Glucose-Capillary 183 (H) 70 - 99 mg/dL    Comment: Glucose reference range applies only to samples taken after fasting for at least 8 hours.  Glucose, capillary     Status: Abnormal   Collection Time: 10/20/23  7:44 AM  Result Value Ref Range   Glucose-Capillary 224 (H) 70 - 99 mg/dL    Comment: Glucose reference range applies only to samples taken after fasting for at least 8 hours.   No results found.    Blood pressure (!) 149/77, pulse 76, temperature 98.4 F (36.9 C), temperature source Oral, resp. rate 17, height 6\' 4"  (1.93 m), weight 97.5 kg, SpO2 99%.  Medical Problem List and Plan: 1. Functional deficits secondary to L BKA due to osteomyelitis of L foot- subacute  -patient may not shower due to VAC  -ELOS/Goals: 10-14 days- mod I- going home alone?  Admit to CIR 2.  Antithrombotics: -DVT/anticoagulation:  Pharmaceutical: Lovenox  added  -antiplatelet therapy:  3. Pain Management: Continue Oxycodone  prn. On home dose gabapentin  but will increase gabapentin  to 1200 mg TID and increase Oxy to 10-15 mg q4 hours prn.  4. Mood/Behavior/Sleep: LCSW to follow for evaluation and support.   -antipsychotic agents: Caplyta  and Lithium .  --trazodone  prn for  chronic insomnia.  5. Neuropsych/cognition: This patient is capable of making decisions on his own behalf. 6. Skin/Wound Care: Continue wound VAC--can change to prevena if present in room.   --continue Vitamin C  and Zinc . Prostat added for wound healing.  7. Fluids/Electrolytes/Nutrition:  Monitor I/O. Check CMET in am. 8.  T2DM: Hgb A1c-7.7  (improved from 10.9 in Feb '25)  --monitor BS ac/hs and use SSI for elevated BS.  --continue Insulin  glargine 40 units/HS and novolog  4 units tid ac.   9. HTN: Monitor BP tid. Not on altace  at this time.  10.  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 PUD/Candida esophagitis hx: Recent episode of gastroenteritis. Dr. Willy Harvest 12. Bipolar d/o/GAD w/recent severe depressive episode: On Effexor , Adderall, Clonazepam , Gabapentin  and Caplyta .   --Lithium  added May 15th by Dr. [facility]. 13. ADHD: Adderall 30 mg bid (increased 10/05/23  for activation) 14. B12 deficiency?:Will check level in am.  15. Charc[employer]ill  of RLE-  16. Constipation- LBM 6 days ago- will increase bowel meds 17. Cough with low grade fever- will get CXR 1 view  If some orders are changed as per A/P, pt was not admitted to CIR as of 9pm Tuesday night. Please get: increase in gabapentin  to 1200 mg TID, increase Oxy to 10-15 mg q4 hours prn and get CXR due to low grade temps and cough- pt able to do ICS regularly at >2000 ml of note, but had hoarse thick cough when I was in room.   /I have personally performed a face to face diagnostic evaluation of this patient and formulated the key components of the plan.  Additionally, I have personally reviewed laboratory data, imaging studies, as well as relevant notes and concur with the physician assistant's documentation above.   The patient's status has not changed from the original H&P.  Any changes in documentation from the acute care chart have been noted above.        Zelda Hickman, PA-C 10/20/2023

## 2023-10-20 NOTE — H&P (Signed)
 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 Demetria Finch), IV antibiotics and hyperbaric tx ([facility]). He has had progressive breakdown ulceration to the bone and pain left knee. He was evaluated by Dr. Julio Ohm who recommended amputation. He was admitted to [facility] on 10/16/23 for L-BKA and has been participating in PT/OT.  He lives alone and was independent PTA with use of scooter due to NWB LLE. He currently requires min to CGA and CIR recommended due to functional decline.    Pt reports has been on gabapentin  for "years" and just not enough right now- taking Oxy 2-3x/day, and usually taking 10 mg.  Been having low grade temps- was 102.8 the 'other' day- but Is concerned because having more of a cough.  Pain 7-8/10 at rest since block has worn off- but spikes to "10+/10" at time with electrical lightening that goes through his L residual limb.    Says his mood is MUCH better since they added Lithium - feels like it's made the most difference than anything in his whole life- "wished he had it 30-40 years ago".    LBM 6 days ago, but doesn't feel bloated.    Review of Systems  Constitutional:  Negative for chills and fever.  HENT:  Negative for hearing loss and tinnitus.   Eyes:  Negative for blurred vision.  Cardiovascular:  Negative for chest pain and palpitations.  Gastrointestinal:  Positive for constipation. Negative for heartburn and nausea.  Genitourinary:  Positive for frequency and urgency.  Musculoskeletal:  Positive for myalgias.  Neurological:  Positive for sensory change (numbness BLE and now right and left hand "cant open a bag of chips") and weakness. Negative for dizziness and headaches.   Psychiatric/Behavioral:  The patient is nervous/anxious and has insomnia.   All other systems reviewed and are negative.           Past Medical History:  Diagnosis Date   ADHD (attention deficit hyperactivity disorder)     Anxiety and depression     Candida esophagitis (HCC)      in setting of DKA/hyperglycemia   CIDP (chronic inflammatory demyelinating polyneuropathy) (HCC)     Depression     Diabetes type 2, controlled (HCC)     Diverticulosis     Essential hypertension 06/21/2014   Hyperlipidemia     Kidney stones     Pancreatitis     Stomach ulcer     Vitamin D deficiency                 Past Surgical History:  Procedure Laterality Date   AMPUTATION Left 10/16/2023    Procedure: LEFT BELOW KNEE AMPUTATION;  Surgeon: Timothy Ford, MD;  Location: [facility] OR;  Service: Orthopedics;  Laterality: Left;  LEFT BELOW KNEE AMPUTATION   ANKLE SURGERY       APPLICATION OF WOUND VAC Left 10/16/2023    Procedure: APPLICATION, WOUND VAC;  Surgeon: Timothy Ford, MD;  Location: [facility] OR;  Service: Orthopedics;  Laterality: Left;   CERVICAL DISC SURGERY        C4-C5   ELBOW SURGERY       ESOPHAGOGASTRODUODENOSCOPY N/A  07/16/2017    Procedure: ESOPHAGOGASTRODUODENOSCOPY (EGD);  Surgeon: Kenney Peacemaker, MD;  Location: [facility] ENDOSCOPY;  Service: Endoscopy;  Laterality: N/A;   IRRIGATION AND DEBRIDEMENT FOOT Left 07/31/2023    Procedure: IRRIGATION AND DEBRIDEMENT FOOT AND FIFTH MET RESCTION;  Surgeon: Evertt Hoe, DPM;  Location: [facility] OR;  Service: Orthopedics/Podiatry;  Laterality: Left;   RETINAL DETACHMENT SURGERY       RHINOPLASTY       VASECTOMY       WOUND DEBRIDEMENT Left 03/27/2018    Procedure: DEBRIDEMENT WOUND;  Surgeon: Charity Conch, DPM;  Location: [facility] OR;  Service: Podiatry;  Laterality: Left;               Family History  Problem Relation Age of Onset   Stroke Father     Breast cancer Mother     Colon cancer Neg Hx            Social History: Lives  alone and independent PTA. He  reports that he has quit smoking. He has quit using smokeless tobacco. He reports that he does not currently use alcohol. He reports that he does not use drugs.     Allergies       Allergies  Allergen Reactions   Ritalin [Methylphenidate] Palpitations and Other (See Comments)      Hyperactivity, also              Medications Prior to Admission  Medication Sig Dispense Refill   amphetamine -dextroamphetamine  (ADDERALL) 30 MG tablet Take 1 tablet by mouth 2 (two) times daily.       atorvastatin  (LIPITOR) 10 MG tablet Take 10 mg by mouth daily.       Cholecalciferol (VITAMIN D3 MAXIMUM STRENGTH) 125 MCG (5000 UT) capsule Take 5,000 Units by mouth daily.       clonazePAM  (KLONOPIN ) 1 MG tablet Take 1-1.5 mg by mouth at bedtime.       cyanocobalamin (VITAMIN B12) 1000 MCG tablet Take 2,000 mcg by mouth daily.       Desvenlafaxine ER 100 MG TB24 Take 100 mg by mouth daily.       fenofibrate  160 MG tablet Take 160 mg by mouth daily.       gabapentin  (NEURONTIN ) 800 MG tablet Take 800 mg by mouth 3 (three) times daily.       Glucagon  (GVOKE HYPOPEN  2-PACK) 1 MG/0.2ML SOAJ Inject 1 mg into the skin as needed for up to 2 doses (Severe low blood sugar). 0.4 mL 0   insulin  aspart (NOVOLOG ) 100 UNIT/ML FlexPen Inject 0-6 Units into the skin 3 (three) times daily with meals. Check Blood Glucose (BG) and inject per scale: BG <150= 0 unit; BG 150-200= 1 unit; BG 201-250= 2 unit; BG 251-300= 3 unit; BG 301-350= 4 unit; BG 351-400= 5 unit; BG >400= 6 unit and Call Primary Care. (Patient taking differently: Inject 8-24 Units into the skin 3 (three) times daily as needed for high blood sugar.) 15 mL 0   insulin  glargine (LANTUS ) 100 UNIT/ML Solostar Pen Inject 15 Units into the skin 2 (two) times daily. May substitute as needed per insurance. (Patient taking differently: Inject 45 Units into the skin at bedtime. May substitute as needed per insurance.) 15 mL 0   lithium  carbonate  150 MG capsule Take 150 mg by mouth at bedtime.       lumateperone  tosylate (CAPLYTA ) 42 MG capsule Take 42 mg by mouth at bedtime.       Nutritional Supplements (  JUVEN PO) Take 1 Container by mouth 2 (two) times daily.       OVER THE COUNTER MEDICATION Take 1,000 mg by mouth at bedtime as needed (sleep/anxiety). CBD gummies       traZODone  (DESYREL ) 50 MG tablet Take 50-100 mg by mouth at bedtime as needed for sleep.       Blood Glucose Monitoring Suppl DEVI 1 each by Does not apply route 3 (three) times daily. May dispense any manufacturer covered by patient's insurance. 1 each 0   Continuous Blood Gluc Receiver (FREESTYLE LIBRE 14 DAY READER) DEVI every 14 (fourteen) days.       Continuous Blood Gluc Sensor (FREESTYLE LIBRE 14 DAY SENSOR) MISC     2   Glucose Blood (BLOOD GLUCOSE TEST STRIPS) STRP 1 each by Does not apply route 3 (three) times daily. Use as directed to check blood sugar. May dispense any manufacturer covered by patient's insurance and fits patient's device. 100 strip 0   Insulin  Pen Needle (PEN NEEDLES) 31G X 5 MM MISC 1 each by Does not apply route 3 (three) times daily. May dispense any manufacturer covered by patient's insurance. 100 each 0   Lancet Device MISC 1 each by Does not apply route 3 (three) times daily. May dispense any manufacturer covered by patient's insurance. 1 each 0   Lancets MISC 1 each by Does not apply route 3 (three) times daily. Use as directed to check blood sugar. May dispense any manufacturer covered by patient's insurance and fits patient's device. 100 each 0   ramipril  (ALTACE ) 2.5 MG capsule Take 2.5 mg by mouth daily.       TECHLITE PEN NEEDLES 31G X 8 MM MISC USE TO GIVE INSULIN  4 TIMES DAILY                  Home: Home Living Family/patient expects to be discharged to:: Private residence Living Arrangements: Alone Available Help at Discharge: Family, Friend(s), Available PRN/intermittently Type of Home: House Home Access: Stairs to  enter Secretary/administrator of Steps: 2 Entrance Stairs-Rails: None Home Layout: One level Bathroom Shower/Tub: Engineer, manufacturing systems: Standard Bathroom Accessibility: Yes Home Equipment: Other (comment), Rolling Walker (2 wheels), Cane - single point Additional Comments: knee scooter, has been on it for the last 6 months   Functional History: Prior Function Prior Level of Function : Independent/Modified Independent   Functional Status:  Mobility: Bed Mobility Overal bed mobility: Needs Assistance Bed Mobility: Supine to Sit, Sit to Supine Supine to sit: Supervision Sit to supine: Supervision General bed mobility comments: Pt received in recliner Transfers Overall transfer level: Needs assistance Equipment used: Rolling walker (2 wheels) Transfers: Sit to/from Stand Sit to Stand: Min assist Bed to/from chair/wheelchair/BSC transfer type:: Step pivot Step pivot transfers: Contact guard assist General transfer comment: light rise and steady assist from low surface. stand x2 from EOB, first attempt letting go of RW with L hand with LOB and poor eccentric lower back onto EOB. Ambulation/Gait Ambulation/Gait assistance: Contact guard assist Gait Distance (Feet): 75 Feet Assistive device: Rolling walker (2 wheels) Gait Pattern/deviations: Step-to pattern General Gait Details: hop-to pattern with mod cuing for sequencing, placement of RW, placement of R foot within RW, and alignment of LLE as pt with tendency for hip flexion and abduction. Gait velocity: decr   ADL: ADL Overall ADL's : Needs assistance/impaired Eating/Feeding: Set up, Sitting Grooming: Wash/dry hands, Contact guard assist, Standing Grooming Details (indicate cue type and reason): CGA for balance while standing  Upper Body Dressing : Set up, Sitting Lower Body Dressing: Contact guard assist, Sit to/from stand Lower Body Dressing Details (indicate cue type and reason): Adequate standing balance Toilet  Transfer: Contact guard assist, Ambulation, Rolling walker (2 wheels) Toilet Transfer Details (indicate cue type and reason): CGA for balance able to hop into bathroom with RW Toileting- Clothing Manipulation and Hygiene: Contact guard assist, Sit to/from stand Toileting - Clothing Manipulation Details (indicate cue type and reason): Adequate standing balance Functional mobility during ADLs: Contact guard assist, Rolling walker (2 wheels)   Cognition: Cognition Orientation Level: Oriented X4 Cognition Arousal: Alert Behavior During Therapy: WFL for tasks assessed/performed     Blood pressure (!) 149/77, pulse 76, temperature 98.4 F (36.9 C), temperature source Oral, resp. rate 17, height 6\' 4"  (1.93 m), weight 97.5 kg, SpO2 99%. Physical Exam Vitals and nursing note reviewed. Exam conducted with a chaperone present.  Constitutional:      Appearance: Normal appearance. He is normal weight.     Comments: Verbose- friends in room; awake, alert, tangential and hard ot stay on topic; squirming due to pain; NAD   HENT:     Head: Normocephalic and atraumatic.     Right Ear: External ear normal.     Left Ear: External ear normal.     Nose: Nose normal. No congestion.     Mouth/Throat:     Mouth: Mucous membranes are dry.     Pharynx: Oropharynx is clear. No oropharyngeal exudate.  Eyes:     General:        Right eye: No discharge.        Left eye: No discharge.     Extraocular Movements: Extraocular movements intact.  Cardiovascular:     Rate and Rhythm: Normal rate and regular rhythm.     Heart sounds: Normal heart sounds. No murmur heard.    No gallop.  Pulmonary:     Effort: Pulmonary effort is normal. No respiratory distress.     Breath sounds: Normal breath sounds. No wheezing, rhonchi or rales.  Abdominal:     General: There is distension.     Palpations: Abdomen is soft.     Tenderness: There is no abdominal tenderness.     Comments: Very hypoactive even after finished  lunch  Musculoskeletal:     Cervical back: Neck supple. No tenderness.     Comments: Ue's 5/5 B/L RLE- HF 5/5; KE/KF 5-/5; DF 2+/5 and PF 4/5 LLE- HF 5/5; KE/KF limited by pain- at least 3/5 L BKA in VAC with shrinker-  Skin:    General: Skin is warm and dry.     Comments: Lesion on right lateral malleolus --clean and dry but tends to drain at times. Multiple scabs on right shin. Left hand with NSL and erythema surrounding it. Knobby area below patella on R knee- osgood schlotters ?  Neurological:     Mental Status: He is alert and oriented to person, place, and time.     Comments: Decreased to light touch below mid calf on RLE and tip of L BKA  Psychiatric:     Comments: Slightly manic?- tangential  versus a little hyper- and almost pressured speech at times- brighter affect        Lab Results Last 48 Hours        Results for orders placed or performed during the hospital encounter of 10/16/23 (from the past 48 hours)  Glucose, capillary     Status: Abnormal    Collection Time:  10/18/23 11:53 AM  Result Value Ref Range    Glucose-Capillary 171 (H) 70 - 99 mg/dL      Comment: Glucose reference range applies only to samples taken after fasting for at least 8 hours.  Glucose, capillary     Status: Abnormal    Collection Time: 10/18/23  4:51 PM  Result Value Ref Range    Glucose-Capillary 176 (H) 70 - 99 mg/dL      Comment: Glucose reference range applies only to samples taken after fasting for at least 8 hours.  Glucose, capillary     Status: Abnormal    Collection Time: 10/18/23  8:35 PM  Result Value Ref Range    Glucose-Capillary 236 (H) 70 - 99 mg/dL      Comment: Glucose reference range applies only to samples taken after fasting for at least 8 hours.  Glucose, capillary     Status: Abnormal    Collection Time: 10/19/23  8:10 AM  Result Value Ref Range    Glucose-Capillary 211 (H) 70 - 99 mg/dL      Comment: Glucose reference range applies only to samples taken after  fasting for at least 8 hours.    Comment 1 Notify RN    Glucose, capillary     Status: Abnormal    Collection Time: 10/19/23  9:24 AM  Result Value Ref Range    Glucose-Capillary 260 (H) 70 - 99 mg/dL      Comment: Glucose reference range applies only to samples taken after fasting for at least 8 hours.  Glucose, capillary     Status: Abnormal    Collection Time: 10/19/23 12:03 PM  Result Value Ref Range    Glucose-Capillary 121 (H) 70 - 99 mg/dL      Comment: Glucose reference range applies only to samples taken after fasting for at least 8 hours.    Comment 1 Notify RN    Glucose, capillary     Status: Abnormal    Collection Time: 10/19/23  4:46 PM  Result Value Ref Range    Glucose-Capillary 195 (H) 70 - 99 mg/dL      Comment: Glucose reference range applies only to samples taken after fasting for at least 8 hours.    Comment 1 Notify RN    Glucose, capillary     Status: Abnormal    Collection Time: 10/19/23  8:13 PM  Result Value Ref Range    Glucose-Capillary 183 (H) 70 - 99 mg/dL      Comment: Glucose reference range applies only to samples taken after fasting for at least 8 hours.  Glucose, capillary     Status: Abnormal    Collection Time: 10/20/23  7:44 AM  Result Value Ref Range    Glucose-Capillary 224 (H) 70 - 99 mg/dL      Comment: Glucose reference range applies only to samples taken after fasting for at least 8 hours.      Imaging Results (Last 48 hours)  No results found.         Blood pressure (!) 149/77, pulse 76, temperature 98.4 F (36.9 C), temperature source Oral, resp. rate 17, height 6\' 4"  (1.93 m), weight 97.5 kg, SpO2 99%.   Medical Problem List and Plan: 1. Functional deficits secondary to L BKA due to osteomyelitis of L foot- subacute             -patient may not shower due to VAC             -ELOS/Goals:  10-14 days- mod I- going home alone?             Admit to CIR 2.  Antithrombotics: -DVT/anticoagulation:  Pharmaceutical: Lovenox  added              -antiplatelet therapy:  3. Pain Management: Continue Oxycodone  prn. On home dose gabapentin  but will increase gabapentin  to 1200 mg TID and increase Oxy to 10-15 mg q4 hours prn.  4. Mood/Behavior/Sleep: LCSW to follow for evaluation and support.              -antipsychotic agents: Caplyta  and Lithium .             --trazodone  prn for chronic insomnia.  5. Neuropsych/cognition: This patient is capable of making decisions on his own behalf. 6. Skin/Wound Care: Continue wound VAC--can change to prevena if present in room.              --continue Vitamin C  and Zinc . Prostat added for wound healing.  7. Fluids/Electrolytes/Nutrition:  Monitor I/O. Check CMET in am. 8.  T2DM: Hgb A1c-7.7  (improved from 10.9 in Feb '25)             --monitor BS ac/hs and use SSI for elevated BS.             --continue Insulin  glargine 40 units/HS and novolog  4 units tid ac.   9. HTN: Monitor BP tid. Not on altace  at this time.  10.  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 PUD/Candida esophagitis hx: Recent episode of gastroenteritis. Dr. Willy Harvest 12. Bipolar d/o/GAD w/recent severe depressive episode: On Effexor , Adderall, Clonazepam , Gabapentin  and Caplyta .              --Lithium  added May 15th by Dr. [facility]. 13. ADHD: Adderall 30 mg bid (increased 10/05/23  for activation) 14. B12 deficiency?:Will check level in am.  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 Constipation- LBM 6 days ago- will increase bowel meds 17. Cough with low grade fever- will get CXR 1 view   If some orders are changed as per A/P, pt was not admitted to CIR as of 9pm Tuesday night. Please get: increase in gabapentin  to 1200 mg TID, increase Oxy to 10-15 mg q4 hours prn and get CXR due to low grade temps and cough- pt able to do ICS regularly at >2000 ml of note, but had hoarse thick cough when I was in room.    /I have personally performed a face to face diagnostic evaluation of this patient and formulated the key  components of the plan.  Additionally, I have personally reviewed laboratory data, imaging studies, as well as relevant notes and concur with the physician assistant's documentation above.   The patient's status has not changed from the original H&P.  Any changes in documentation from the acute care chart have been noted above.             Zelda Hickman, PA-C 10/20/2023

## 2023-10-20 NOTE — Plan of Care (Signed)
   Problem: Education: Goal: Ability to describe self-care measures that may prevent or decrease complications (Diabetes Survival Skills Education) will improve Outcome: Progressing   Problem: Coping: Goal: Ability to adjust to condition or change in health will improve Outcome: Progressing   Problem: Fluid Volume: Goal: Ability to maintain a balanced intake and output will improve Outcome: Progressing

## 2023-10-20 NOTE — Evaluation (Signed)
 Occupational Therapy Evaluation Patient Details Name: [name] MRN: [id number] DOB: 1965/06/20 Today's Date: 10/20/2023   History of Present Illness   Pt is a 58 y.o. 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 PMH: neuropathy, DM, diverticulosis, HTN, hyperlipidemia, pancreatitis, ADHD, anxiety, depression.     Clinical Impressions Pt remains eager to participate and maximize independence. Session focused on standing balance for ADLs at sink w/ modifications as needed to decrease overall fall risk. Light Min A needed for balance/stability w/ dynamic tasks at sink but overall good carryover. Provided UE HEP handout with theraband to maximize overall UB strength.      If plan is discharge home, recommend the following:   A little help with walking and/or transfers;A little help with bathing/dressing/bathroom     Functional Status Assessment         Equipment Recommendations   Other (comment) (TBD)     Recommendations for Other Services   Rehab consult     Precautions/Restrictions   Precautions Precautions: Fall Recall of Precautions/Restrictions: Intact Precaution/Restrictions Comments: LLE wound vac Restrictions Weight Bearing Restrictions Per Provider Order: No     Mobility Bed Mobility Overal bed mobility: Modified Independent                  Transfers Overall transfer level: Needs assistance Equipment used: Rolling walker (2 wheels) Transfers: Sit to/from Stand Sit to Stand: Contact guard assist                  Balance Overall balance assessment: Needs assistance Sitting-balance support: Bilateral upper extremity supported, Feet supported Sitting balance-Leahy Scale: Good     Standing balance support: Bilateral upper extremity supported, During functional activity, Reliant on assistive device for balance, Single extremity supported Standing balance-Leahy Scale: Poor Standing balance comment: reliant on at least one UE  support in standing                           ADL either performed or assessed with clinical judgement   ADL Overall ADL's : Needs assistance/impaired Eating/Feeding: Sitting;Independent   Grooming: Minimal assistance;Standing;Oral care Grooming Details (indicate cue type and reason): light Min A to correct balance when leaning over sink. assist to open toothpaste cap and place items within reach on sink. Discussed one UE support on RW while using R hand for task and other compensatory strategies (placing toothpaste on toothbrush while it was resting on the counter)                               General ADL Comments: Emphasis on balance, ADL modifications and exercises for LLE/hip in bed and provision of UE HEP with red theraband w/ handout.     Vision   Vision Assessment?: No apparent visual deficits     Perception         Praxis         Pertinent Vitals/Pain Pain Assessment Pain Assessment: Faces Faces Pain Scale: Hurts even more Pain Location: L residual limb Pain Descriptors / Indicators: Sharp Pain Intervention(s): Monitored during session, Limited activity within patient's tolerance     Extremity/Trunk Assessment Upper Extremity Assessment Upper Extremity Assessment: Overall WFL for tasks assessed;Right hand dominant   Lower Extremity Assessment Lower Extremity Assessment: Defer to PT evaluation       Communication Communication Communication: No apparent difficulties   Cognition Arousal: Alert Behavior During Therapy: Impulsive, Restless Cognition:  No family/caregiver present to determine baseline             OT - Cognition Comments: attention deficits and restlessness noted w/ pt easily distracted (hx of ADHD). easily redirectable and eager to participate                 Following commands: Intact       Cueing  General Comments   Cueing Techniques: Verbal cues      Exercises     Shoulder Instructions       Home Living                                          Prior Functioning/Environment                      OT Problem List:     OT Treatment/Interventions:        OT Goals(Current goals can be found in the care plan section)   Acute Rehab OT Goals Patient Stated Goal: maximize independence, not have to use a wheelchair OT Goal Formulation: With patient Time For Goal Achievement: 10/31/23 Potential to Achieve Goals: Good ADL Goals Pt Will Perform Lower Body Dressing: with modified independence;sit to/from stand Pt Will Transfer to Toilet: with modified independence;ambulating;regular height toilet Pt Will Perform Toileting - Clothing Manipulation and hygiene: with modified independence;sit to/from stand Additional ADL Goal #1: Pt will retrieve 5 ADL items in room with Mod I in preparation for d/c home   OT Frequency:  Min 2X/week    Co-evaluation              AM-PAC OT "6 Clicks" Daily Activity     Outcome Measure Help from another person eating meals?: None Help from another person taking care of personal grooming?: A Little Help from another person toileting, which includes using toliet, bedpan, or urinal?: A Little Help from another person bathing (including washing, rinsing, drying)?: A Little Help from another person to put on and taking off regular upper body clothing?: A Little Help from another person to put on and taking off regular lower body clothing?: A Little 6 Click Score: 19   End of Session Equipment Utilized During Treatment: Gait belt;Rolling walker (2 wheels)  Activity Tolerance: Patient tolerated treatment well Patient left: in bed;with call bell/phone within reach  OT Visit Diagnosis: Unsteadiness on feet (R26.81);Other abnormalities of gait and mobility (R26.89)                Time: 0454-0981 OT Time Calculation (min): 34 min Charges:  OT General Charges $OT Visit: 1 Visit OT Treatments $Self Care/Home  Management : 8-22 mins $Therapeutic Exercise: 8-22 mins  Lawrence Pretty, OTR/L Acute Rehab Services Office: [phone]   Shireen Dory 10/20/2023, 12:47 PM

## 2023-10-20 NOTE — Progress Notes (Signed)
  Inpatient Rehabilitation Admissions Coordinator   I have insurance approval and CIR bed to admit him to today. Dr Julio Ohm and acute team made aware. Patient in agreement. I will make the arrangements.  Jeannetta Millman, RN, MSN Rehab Admissions Coordinator [phone] 10/20/2023 10:46 AM

## 2023-10-21 ENCOUNTER — Other Ambulatory Visit: Payer: Self-pay

## 2023-10-21 ENCOUNTER — Encounter (HOSPITAL_COMMUNITY): Payer: Self-pay | Admitting: Physical Medicine and Rehabilitation

## 2023-10-21 DIAGNOSIS — Z89512 Acquired absence of left leg below knee: Secondary | ICD-10-CM | POA: Diagnosis not present

## 2023-10-21 DIAGNOSIS — M869 Osteomyelitis, unspecified: Secondary | ICD-10-CM | POA: Diagnosis present

## 2023-10-21 LAB — GLUCOSE, CAPILLARY
Glucose-Capillary: 131 mg/dL — ABNORMAL HIGH (ref 70–99)
Glucose-Capillary: 146 mg/dL — ABNORMAL HIGH (ref 70–99)
Glucose-Capillary: 172 mg/dL — ABNORMAL HIGH (ref 70–99)
Glucose-Capillary: 176 mg/dL — ABNORMAL HIGH (ref 70–99)
Glucose-Capillary: 206 mg/dL — ABNORMAL HIGH (ref 70–99)

## 2023-10-21 LAB — COMPREHENSIVE METABOLIC PANEL WITH GFR
ALT: 12 U/L (ref 0–44)
AST: 18 U/L (ref 15–41)
Albumin: 3 g/dL — ABNORMAL LOW (ref 3.5–5.0)
Alkaline Phosphatase: 49 U/L (ref 38–126)
Anion gap: 9 (ref 5–15)
BUN: 18 mg/dL (ref 6–20)
CO2: 30 mmol/L (ref 22–32)
Calcium: 9.6 mg/dL (ref 8.9–10.3)
Chloride: 100 mmol/L (ref 98–111)
Creatinine, Ser: 1.25 mg/dL — ABNORMAL HIGH (ref 0.61–1.24)
GFR, Estimated: >60 mL/min (ref >60)
Glucose, Bld: 152 mg/dL — ABNORMAL HIGH (ref 70–99)
Potassium: 4.5 mmol/L (ref 3.5–5.1)
Sodium: 139 mmol/L (ref 135–145)
Total Bilirubin: 0.3 mg/dL (ref 0.0–1.2)
Total Protein: 6.5 g/dL (ref 6.5–8.1)

## 2023-10-21 LAB — CBC WITH DIFFERENTIAL/PLATELET
Abs Immature Granulocytes: 0.02 10*3/uL (ref 0.00–0.07)
Basophils Absolute: 0 10*3/uL (ref 0.0–0.1)
Basophils Relative: 0 %
Eosinophils Absolute: 0.5 10*3/uL (ref 0.0–0.5)
Eosinophils Relative: 8 %
HCT: 31.7 % — ABNORMAL LOW (ref 39.0–52.0)
Hemoglobin: 10.8 g/dL — ABNORMAL LOW (ref 13.0–17.0)
Immature Granulocytes: 0 %
Lymphocytes Relative: 25 %
Lymphs Abs: 1.5 10*3/uL (ref 0.7–4.0)
MCH: 29 pg (ref 26.0–34.0)
MCHC: 34.1 g/dL (ref 30.0–36.0)
MCV: 85 fL (ref 80.0–100.0)
Monocytes Absolute: 0.7 10*3/uL (ref 0.1–1.0)
Monocytes Relative: 12 %
Neutro Abs: 3.1 10*3/uL (ref 1.7–7.7)
Neutrophils Relative %: 55 %
Platelets: 219 10*3/uL (ref 150–400)
RBC: 3.73 MIL/uL — ABNORMAL LOW (ref 4.22–5.81)
RDW: 13.3 % (ref 11.5–15.5)
WBC: 5.8 10*3/uL (ref 4.0–10.5)
nRBC: 0 % (ref 0.0–0.2)

## 2023-10-21 LAB — VITAMIN B12: Vitamin B-12: 377 pg/mL (ref 180–914)

## 2023-10-21 MED ORDER — CLONAZEPAM 0.5 MG PO TABS
1.0000 mg | ORAL_TABLET | Freq: Every day | ORAL | Status: DC
  Administered 2023-10-21 – 2023-10-30 (×10): 1 mg via ORAL
  Filled 2023-10-21 (×10): qty 2

## 2023-10-21 MED ORDER — INSULIN ASPART 100 UNIT/ML IJ SOLN
0.0000 [IU] | Freq: Three times a day (TID) | INTRAMUSCULAR | Status: DC
  Administered 2023-10-21: 1 [IU] via SUBCUTANEOUS
  Administered 2023-10-21: 2 [IU] via SUBCUTANEOUS
  Administered 2023-10-21: 3 [IU] via SUBCUTANEOUS
  Administered 2023-10-22 – 2023-10-23 (×3): 2 [IU] via SUBCUTANEOUS
  Administered 2023-10-23 (×2): 1 [IU] via SUBCUTANEOUS
  Administered 2023-10-24: 2 [IU] via SUBCUTANEOUS
  Administered 2023-10-25 – 2023-10-26 (×3): 1 [IU] via SUBCUTANEOUS
  Administered 2023-10-26: 3 [IU] via SUBCUTANEOUS
  Administered 2023-10-27: 2 [IU] via SUBCUTANEOUS
  Administered 2023-10-27: 3 [IU] via SUBCUTANEOUS
  Administered 2023-10-27 – 2023-10-28 (×2): 1 [IU] via SUBCUTANEOUS
  Administered 2023-10-28: 2 [IU] via SUBCUTANEOUS
  Administered 2023-10-29: 1 [IU] via SUBCUTANEOUS
  Administered 2023-10-30 (×2): 2 [IU] via SUBCUTANEOUS
  Administered 2023-10-30: 1 [IU] via SUBCUTANEOUS
  Administered 2023-10-31: 2 [IU] via SUBCUTANEOUS

## 2023-10-21 MED ORDER — SORBITOL 70 % SOLN: 60.0000 mL | Freq: Every day | Status: DC | PRN

## 2023-10-21 MED ORDER — GABAPENTIN 400 MG PO CAPS
800.0000 mg | ORAL_CAPSULE | Freq: Three times a day (TID) | ORAL | Status: DC
  Administered 2023-10-21 – 2023-10-26 (×17): 800 mg via ORAL
  Filled 2023-10-21 (×17): qty 2

## 2023-10-21 MED ORDER — VENLAFAXINE HCL ER 75 MG PO CP24
75.0000 mg | ORAL_CAPSULE | Freq: Every day | ORAL | Status: DC
  Administered 2023-10-21 – 2023-10-31 (×11): 75 mg via ORAL
  Filled 2023-10-21 (×11): qty 1

## 2023-10-21 MED ORDER — INSULIN GLARGINE-YFGN 100 UNIT/ML ~~LOC~~ SOLN
40.0000 [IU] | Freq: Every day | SUBCUTANEOUS | Status: DC
  Administered 2023-10-21 – 2023-10-22 (×2): 40 [IU] via SUBCUTANEOUS
  Filled 2023-10-21 (×3): qty 0.4

## 2023-10-21 MED ORDER — OXYCODONE HCL 5 MG PO TABS
5.0000 mg | ORAL_TABLET | ORAL | Status: DC | PRN
  Administered 2023-10-21 – 2023-10-25 (×5): 10 mg via ORAL
  Administered 2023-10-26: 5 mg via ORAL
  Administered 2023-10-26 – 2023-10-27 (×2): 10 mg via ORAL
  Administered 2023-10-27: 5 mg via ORAL
  Administered 2023-10-28 – 2023-10-30 (×4): 10 mg via ORAL
  Filled 2023-10-21: qty 1
  Filled 2023-10-21 (×2): qty 2
  Filled 2023-10-21: qty 1
  Filled 2023-10-21 (×2): qty 2
  Filled 2023-10-21: qty 1
  Filled 2023-10-21 (×7): qty 2

## 2023-10-21 MED ORDER — METHOCARBAMOL 750 MG PO TABS
750.0000 mg | ORAL_TABLET | Freq: Four times a day (QID) | ORAL | Status: DC | PRN
Start: 2023-10-21 — End: 2023-10-31
  Administered 2023-10-23 – 2023-10-30 (×11): 750 mg via ORAL
  Filled 2023-10-21 (×12): qty 1

## 2023-10-21 MED ORDER — POLYETHYLENE GLYCOL 3350 17 G PO PACK: 17.0000 g | PACK | Freq: Every day | ORAL | Status: DC | PRN

## 2023-10-21 MED ORDER — ALUM & MAG HYDROXIDE-SIMETH 200-200-20 MG/5ML PO SUSP: 30.0000 mL | ORAL | Status: DC | PRN

## 2023-10-21 MED ORDER — ENOXAPARIN SODIUM 40 MG/0.4ML IJ SOSY
40.0000 mg | PREFILLED_SYRINGE | INTRAMUSCULAR | Status: DC
  Administered 2023-10-21 – 2023-10-31 (×11): 40 mg via SUBCUTANEOUS
  Filled 2023-10-21 (×11): qty 0.4

## 2023-10-21 MED ORDER — ATORVASTATIN CALCIUM 10 MG PO TABS
10.0000 mg | ORAL_TABLET | Freq: Every day | ORAL | Status: DC
  Administered 2023-10-21 – 2023-10-31 (×11): 10 mg via ORAL
  Filled 2023-10-21 (×11): qty 1

## 2023-10-21 MED ORDER — TRAZODONE HCL 50 MG PO TABS
25.0000 mg | ORAL_TABLET | Freq: Every evening | ORAL | Status: DC | PRN
  Administered 2023-10-21 – 2023-10-30 (×11): 50 mg via ORAL
  Filled 2023-10-21 (×11): qty 1

## 2023-10-21 MED ORDER — JUVEN PO PACK
1.0000 | PACK | Freq: Two times a day (BID) | ORAL | Status: DC
  Administered 2023-10-21 – 2023-10-31 (×21): 1 via ORAL
  Filled 2023-10-21 (×21): qty 1

## 2023-10-21 MED ORDER — POLYETHYLENE GLYCOL 3350 17 G PO PACK
32.0000 g | PACK | Freq: Once | ORAL | Status: AC
  Administered 2023-10-21: 32 g via ORAL
  Filled 2023-10-21: qty 2

## 2023-10-21 MED ORDER — INSULIN ASPART 100 UNIT/ML IJ SOLN
4.0000 [IU] | Freq: Three times a day (TID) | INTRAMUSCULAR | Status: DC
  Administered 2023-10-21 – 2023-10-26 (×16): 4 [IU] via SUBCUTANEOUS

## 2023-10-21 MED ORDER — PROCHLORPERAZINE 25 MG RE SUPP: 12.5000 mg | Freq: Four times a day (QID) | RECTAL | Status: DC | PRN

## 2023-10-21 MED ORDER — LUMATEPERONE TOSYLATE 42 MG PO CAPS
42.0000 mg | ORAL_CAPSULE | Freq: Every day | ORAL | Status: DC
  Administered 2023-10-21 – 2023-10-30 (×10): 42 mg via ORAL
  Filled 2023-10-21 (×11): qty 1

## 2023-10-21 MED ORDER — AMPHETAMINE-DEXTROAMPHETAMINE 10 MG PO TABS
30.0000 mg | ORAL_TABLET | Freq: Two times a day (BID) | ORAL | Status: DC
  Administered 2023-10-21 – 2023-10-31 (×21): 30 mg via ORAL
  Filled 2023-10-21 (×21): qty 3

## 2023-10-21 MED ORDER — DIPHENHYDRAMINE HCL 25 MG PO CAPS: 25.0000 mg | ORAL_CAPSULE | Freq: Four times a day (QID) | ORAL | Status: DC | PRN

## 2023-10-21 MED ORDER — ACETAMINOPHEN 325 MG PO TABS: 325.0000 mg | ORAL_TABLET | ORAL | Status: DC | PRN

## 2023-10-21 MED ORDER — ZINC SULFATE 220 (50 ZN) MG PO CAPS
220.0000 mg | ORAL_CAPSULE | Freq: Every day | ORAL | Status: AC
  Administered 2023-10-21 – 2023-10-29 (×9): 220 mg via ORAL
  Filled 2023-10-21 (×9): qty 1

## 2023-10-21 MED ORDER — VITAMIN C 500 MG PO TABS
1000.0000 mg | ORAL_TABLET | Freq: Every day | ORAL | Status: DC
  Administered 2023-10-21 – 2023-10-31 (×11): 1000 mg via ORAL
  Filled 2023-10-21 (×11): qty 2

## 2023-10-21 MED ORDER — INSULIN ASPART 100 UNIT/ML IJ SOLN
0.0000 [IU] | Freq: Every day | INTRAMUSCULAR | Status: DC
  Administered 2023-10-24: 2 [IU] via SUBCUTANEOUS
  Administered 2023-10-25 – 2023-10-30 (×2): 3 [IU] via SUBCUTANEOUS

## 2023-10-21 MED ORDER — PROCHLORPERAZINE EDISYLATE 10 MG/2ML IJ SOLN: 5.0000 mg | Freq: Four times a day (QID) | INTRAMUSCULAR | Status: DC | PRN

## 2023-10-21 MED ORDER — BENEPROTEIN PO POWD
1.0000 | Freq: Three times a day (TID) | ORAL | Status: DC
  Administered 2023-10-21 – 2023-10-31 (×23): 6 g via ORAL
  Filled 2023-10-21: qty 227

## 2023-10-21 MED ORDER — ACETAMINOPHEN 500 MG PO TABS
1000.0000 mg | ORAL_TABLET | Freq: Three times a day (TID) | ORAL | Status: DC
  Administered 2023-10-21 – 2023-10-31 (×31): 1000 mg via ORAL
  Filled 2023-10-21 (×31): qty 2

## 2023-10-21 MED ORDER — OXYCODONE HCL 5 MG PO TABS: 10.0000 mg | ORAL_TABLET | ORAL | Status: DC | PRN

## 2023-10-21 MED ORDER — PROCHLORPERAZINE MALEATE 5 MG PO TABS: 5.0000 mg | ORAL_TABLET | Freq: Four times a day (QID) | ORAL | Status: DC | PRN

## 2023-10-21 MED ORDER — FENOFIBRATE 160 MG PO TABS
160.0000 mg | ORAL_TABLET | Freq: Every day | ORAL | Status: DC
  Administered 2023-10-21 – 2023-10-31 (×11): 160 mg via ORAL
  Filled 2023-10-21 (×11): qty 1

## 2023-10-21 MED ORDER — OXYCODONE HCL 5 MG PO TABS
5.0000 mg | ORAL_TABLET | Freq: Three times a day (TID) | ORAL | Status: DC
  Administered 2023-10-21 – 2023-10-30 (×26): 5 mg via ORAL
  Filled 2023-10-21 (×27): qty 1

## 2023-10-21 MED ORDER — PANTOPRAZOLE SODIUM 40 MG PO TBEC
40.0000 mg | DELAYED_RELEASE_TABLET | Freq: Every day | ORAL | Status: DC
  Administered 2023-10-21 – 2023-10-31 (×11): 40 mg via ORAL
  Filled 2023-10-21 (×10): qty 1
  Filled 2023-10-21: qty 2

## 2023-10-21 MED ORDER — GUAIFENESIN-DM 100-10 MG/5ML PO SYRP
5.0000 mL | ORAL_SOLUTION | Freq: Four times a day (QID) | ORAL | Status: DC | PRN
  Administered 2023-10-22: 10 mL via ORAL
  Filled 2023-10-21: qty 10

## 2023-10-21 MED ORDER — POLYETHYLENE GLYCOL 3350 17 G PO PACK
17.0000 g | PACK | Freq: Every day | ORAL | Status: DC
  Administered 2023-10-21 – 2023-10-22 (×2): 17 g via ORAL
  Filled 2023-10-21 (×3): qty 1

## 2023-10-21 MED ORDER — LITHIUM CARBONATE 150 MG PO CAPS
150.0000 mg | ORAL_CAPSULE | Freq: Every day | ORAL | Status: DC
  Administered 2023-10-21 – 2023-10-30 (×10): 150 mg via ORAL
  Filled 2023-10-21 (×11): qty 1

## 2023-10-21 MED ORDER — FLEET ENEMA RE ENEM: 1.0000 | ENEMA | Freq: Once | RECTAL | Status: DC | PRN

## 2023-10-21 NOTE — Progress Notes (Signed)
 Occupational Therapy Assessment and Plan  Patient Details  Name: [name] MRN: [id number] Date of Birth: August 08, 1965  OT Diagnosis: muscle weakness (generalized), pain in joint, and swelling of limb Rehab Potential: Rehab Potential (ACUTE ONLY): Good ELOS: 7 days   Today's Date: 10/21/2023 OT Individual Time: 1027-2536 OT Individual Time Calculation (min): 64 min     Hospital Problem: Principal Problem:   S/P BKA (below knee amputation) unilateral, left (HCC) Active Problems:   Osteomyelitis of left foot (HCC)   Past Medical History:  Past Medical History:  Diagnosis Date   ADHD (attention deficit hyperactivity disorder)    Anxiety and depression    Candida esophagitis (HCC)    in setting of DKA/hyperglycemia   CIDP (chronic inflammatory demyelinating polyneuropathy) (HCC)    Depression    Diabetes type 2, controlled (HCC)    Diverticulosis    Essential hypertension 06/21/2014   Hyperlipidemia    Kidney stones    Pancreatitis    Stomach ulcer    Vitamin D deficiency    Past Surgical History:  Past Surgical History:  Procedure Laterality Date   AMPUTATION Left 10/16/2023   Procedure: LEFT BELOW KNEE AMPUTATION;  Surgeon: Timothy Ford, MD;  Location: [facility] OR;  Service: Orthopedics;  Laterality: Left;  LEFT BELOW KNEE AMPUTATION   ANKLE SURGERY     APPLICATION OF WOUND VAC Left 10/16/2023   Procedure: APPLICATION, WOUND VAC;  Surgeon: Timothy Ford, MD;  Location: [facility] OR;  Service: Orthopedics;  Laterality: Left;   CERVICAL DISC SURGERY     C4-C5   ELBOW SURGERY     ESOPHAGOGASTRODUODENOSCOPY N/A 07/16/2017   Procedure: ESOPHAGOGASTRODUODENOSCOPY (EGD);  Surgeon: Kenney Peacemaker, MD;  Location: [facility] ENDOSCOPY;  Service: Endoscopy;  Laterality: N/A;   IRRIGATION AND DEBRIDEMENT FOOT Left 07/31/2023   Procedure: IRRIGATION AND DEBRIDEMENT FOOT AND FIFTH MET RESCTION;  Surgeon: Evertt Hoe, DPM;  Location: [facility] OR;  Service: Orthopedics/Podiatry;  Laterality:  Left;   RETINAL DETACHMENT SURGERY     RHINOPLASTY     VASECTOMY     WOUND DEBRIDEMENT Left 03/27/2018   Procedure: DEBRIDEMENT WOUND;  Surgeon: Charity Conch, DPM;  Location: [facility] OR;  Service: Podiatry;  Laterality: Left;    Assessment & Plan Clinical Impression: [name]. 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 Demetria Finch), IV antibiotics and hyperbaric tx ([facility]). He has had progressive breakdown ulceration to the bone and pain left knee. He was evaluated by Dr. Julio Ohm who recommended amputation. He was admitted to [facility] on 10/16/23 for L-BKA and has been participating in PT/OT. He lives alone and was independent PTA with use of scooter due to NWB LLE. He currently requires min to CGA and CIR recommended due to functional decline. Patient transferred to CIR on 10/20/2023 .    Patient currently requires min with basic self-care skills secondary to muscle weakness, decreased cardiorespiratoy endurance, and decreased standing balance and decreased balance strategies.  Prior to hospitalization, patient could complete ADLs with modified independent .  Patient will benefit from skilled intervention to increase independence with basic self-care skills prior to discharge home with care partner.  Anticipate patient will require intermittent supervision and follow up home health.  OT - End of Session Activity Tolerance: Tolerates 10 - 20 min activity with multiple rests Endurance  Deficit: Yes Endurance Deficit Description: generalized deconditioning OT Assessment Rehab Potential (ACUTE ONLY): Good OT Barriers to Discharge: Decreased caregiver support;Home environment access/layout OT Barriers to Discharge Comments: Waiting for ramp to be installed, will have supervision for one week OT Patient demonstrates  impairments in the following area(s): Balance;Safety;Edema;Sensory;Endurance;Skin Integrity;Motor;Pain OT Basic ADL's Functional Problem(s): Bathing;Dressing;Toileting OT Advanced ADL's Functional Problem(s): Simple Meal Preparation;Light Housekeeping OT Transfers Functional Problem(s): Toilet OT Additional Impairment(s): None OT Plan OT Intensity: Minimum of 1-2 x/day, 45 to 90 minutes OT Frequency: 5 out of 7 days OT Duration/Estimated Length of Stay: 7 days OT Treatment/Interventions: Balance/vestibular training;Discharge planning;Pain management;Self Care/advanced ADL retraining;Therapeutic Activities;UE/LE Coordination activities;Therapeutic Exercise;Skin care/wound managment;Patient/family education;Functional mobility training;Community reintegration;DME/adaptive equipment instruction;Psychosocial support;Splinting/orthotics;UE/LE Strength taining/ROM;Wheelchair propulsion/positioning OT Self Feeding Anticipated Outcome(s): no goal OT Basic Self-Care Anticipated Outcome(s): (S)- mod I OT Toileting Anticipated Outcome(s): mod I OT Bathroom Transfers Anticipated Outcome(s): mod i OT Recommendation Recommendations for Other Services: Neuropsych consult Patient destination: Home Follow Up Recommendations: Home health OT Equipment Recommended: To be determined   OT Evaluation Precautions/Restrictions  Precautions Precautions: Fall Precaution/Restrictions Comments: LLE wound vac Restrictions Weight Bearing Restrictions Per Provider Order: Yes LLE Weight Bearing Per Provider Order: Non weight bearing General Chart Reviewed: Yes Family/Caregiver Present: No Pain Pain Assessment Pain Scale: 0-10 Pain Score: 8  Pain Location: Leg Pain Intervention(s): Medication (See eMAR) Home Living/Prior Functioning Home Living Available Help at Discharge: Family, Available PRN/intermittently Type of Home: House Home Access: Stairs to enter Secretary/administrator of Steps: 2 Entrance  Stairs-Rails: None Home Layout: One level Bathroom Shower/Tub: Armed forces operational officer Accessibility: Yes Additional Comments: knee scooter, has been on it for the last 6 months  Lives With: Alone IADL History Homemaking Responsibilities: Yes Meal Prep Responsibility: Primary Laundry Responsibility: Primary Cleaning Responsibility: Primary Bill Paying/Finance Responsibility: Primary Current License: Yes Mode of Transportation: Car Occupation: On disability Prior Function Level of Independence: Requires assistive device for independence, Independent with transfers, Independent with basic ADLs  Able to Take Stairs?: Yes Driving: Yes Vocation: On disability Vision Baseline Vision/History: 0 No visual deficits Ability to See in Adequate Light: 0 Adequate Patient Visual Report: No change from baseline Vision Assessment?: No apparent visual deficits Perception  Perception: Within Functional Limits Praxis Praxis: WFL Cognition Cognition Overall Cognitive Status: Within Functional Limits for tasks assessed Arousal/Alertness: Awake/alert Memory: Appears intact Awareness: Appears intact Problem Solving: Appears intact Safety/Judgment: Appears intact Brief Interview for Mental Status (BIMS) Repetition of Three Words (First Attempt): 3 Temporal Orientation: Year: Correct Temporal Orientation: Month: Accurate within 5 days Temporal Orientation: Day: Correct Recall: "Sock": No, could not recall Recall: "Blue": Yes, no cue required Recall: "Bed": Yes, after cueing ("a piece of furniture") BIMS Summary Score: 12 Sensation Sensation Light Touch: Impaired by gross assessment Additional Comments: Decreased distal sensation in d/t RLE peripheral neuropathy Coordination Gross Motor Movements are Fluid and Coordinated: Yes Fine Motor Movements are Fluid and Coordinated: Yes Finger Nose Finger Test: WNL Motor  Motor Motor: Within Functional Limits   Trunk/Postural Assessment  Cervical Assessment Cervical Assessment: Within Functional Limits Thoracic Assessment Thoracic Assessment: Within Functional Limits Lumbar Assessment Lumbar Assessment: Within Functional Limits Postural Control Postural Control: Within Functional Limits  Balance Balance Balance Assessed: Yes Static Sitting Balance Static Sitting - Level of Assistance: 7: Independent Dynamic Sitting Balance Dynamic Sitting - Level of Assistance: 6: Modified independent (Device/Increase time) Static Standing Balance Static Standing - Balance Support: Bilateral upper extremity supported Static Standing - Level of Assistance: 4: Min assist (CGA) Dynamic  Standing Balance Dynamic Standing - Balance Support: During functional activity;Bilateral upper extremity supported Dynamic Standing - Level of Assistance: 4: Min assist Extremity/Trunk Assessment RUE Assessment RUE Assessment: Exceptions to [facility] General Strength Comments: Impaired sensation and dexterity in B hands LUE Assessment LUE Assessment: Exceptions to [facility] General Strength Comments: Impaired sensation and dexterity in B hands  Care Tool Care Tool Self Care Eating   Eating Assist Level: Set up assist    Oral Care    Oral Care Assist Level: Supervision/Verbal cueing    Bathing   Body parts bathed by patient: Right arm;Left arm;Abdomen;Front perineal area;Chest;Buttocks;Right upper leg;Left upper leg;Right lower leg;Face;Left lower leg     Assist Level: Minimal Assistance - Patient > 75%    Upper Body Dressing(including orthotics)   What is the patient wearing?: Pull over shirt   Assist Level: Supervision/Verbal cueing    Lower Body Dressing (excluding footwear)   What is the patient wearing?: Pants Assist for lower body dressing: Minimal Assistance - Patient > 75%    Putting on/Taking off footwear   What is the patient wearing?: Non-skid slipper socks Assist for footwear: Moderate Assistance -  Patient 50 - 74%       Care Tool Toileting Toileting activity   Assist for toileting: Minimal Assistance - Patient > 75%     Care Tool Bed Mobility Roll left and right activity   Roll left and right assist level: Supervision/Verbal cueing    Sit to lying activity   Sit to lying assist level: Supervision/Verbal cueing    Lying to sitting on side of bed activity   Lying to sitting on side of bed assist level: the ability to move from lying on the back to sitting on the side of the bed with no back support.: Supervision/Verbal cueing     Care Tool Transfers Sit to stand transfer   Sit to stand assist level: Minimal Assistance - Patient > 75%    Chair/bed transfer   Chair/bed transfer assist level: Minimal Assistance - Patient > 75%     Toilet transfer   Assist Level: Minimal Assistance - Patient > 75%     Care Tool Cognition  Expression of Ideas and Wants Expression of Ideas and Wants: 4. Without difficulty (complex and basic) - expresses complex messages without difficulty and with speech that is clear and easy to understand  Understanding Verbal and Non-Verbal Content Understanding Verbal and Non-Verbal Content: 4. Understands (complex and basic) - clear comprehension without cues or repetitions   Memory/Recall Ability Memory/Recall Ability : Current season;Location of own room;That he or she is in a hospital/hospital unit   Refer to Care Plan for Long Term Goals  SHORT TERM GOAL WEEK 1 OT Short Term Goal 1 (Week 1): STG= LTG d/t ELOS  Recommendations for other services: Neuropsych   Skilled Therapeutic Intervention ADL ADL Eating: Set up Where Assessed-Eating: Wheelchair Grooming: Supervision/safety Where Assessed-Grooming: Wheelchair Upper Body Bathing: Supervision/safety Where Assessed-Upper Body Bathing: Sitting at sink Lower Body Bathing: Minimal assistance Where Assessed-Lower Body Bathing: Sitting at sink;Standing at sink Upper Body Dressing:  Supervision/safety Where Assessed-Upper Body Dressing: Sitting at sink Lower Body Dressing: Minimal assistance Where Assessed-Lower Body Dressing: Standing at sink;Sitting at sink Toileting: Minimal assistance Where Assessed-Toileting: Teacher, adult education: Minimal Dentist Method: Stand Wellsite geologist: Bedside commode Mobility  Bed Mobility Bed Mobility: Supine to Sit;Sit to Supine Supine to Sit: Supervision/Verbal cueing Sit to Supine: Supervision/Verbal cueing Transfers Sit to Stand: Minimal Assistance - Patient >  75% Stand to Sit: Minimal Assistance - Patient > 75%  Skilled OT intervention Skilled OT evaluation completed with the creation of pt centered OT POC. Pt educated on condition, ELOS, rehab expectations, and fall risk reduction strategies throughout session. Pt in 8/10 pain in his residual limb- alerted RN who delivered pain meds. Extra time spend building rapport and gathering relevant hx. He completed ADLs at the sink as described above. He was left in the w/c with all needs met, RN in room passing meds. Anticipate 7 days LOS.   Discharge Criteria: Patient will be discharged from OT if patient refuses treatment 3 consecutive times without medical reason, if treatment goals not met, if there is a change in medical status, if patient makes no progress towards goals or if patient is discharged from hospital.  The above assessment, treatment plan, treatment alternatives and goals were discussed and mutually agreed upon: by patient  Una Ganser 10/21/2023, 12:48 PM

## 2023-10-21 NOTE — Progress Notes (Signed)
 Offered Sorbitol for BM since miralax  did not work. Pt refused and stated he would rather have it later tonight.

## 2023-10-21 NOTE — Progress Notes (Signed)
 Pt arrived on unit via wheelchair.  Transferred to bed, bed in lowest position call light in reach, belongings at bedside.

## 2023-10-21 NOTE — Progress Notes (Signed)
 PROGRESS NOTE   Subjective/Complaints:  No acute complaints.  No events overnight.  Patient does describe some discomfort in the residual limb, difficulty staying ahead of his pain control and resulting in some trouble participating with therapies.  He is agreeable to scheduled medication.  States pain is mostly localized, no phantom pains at this point.  Minimal output into wound VAC.  Eating well, mostly sleeping well.  ROS: Denies fevers, chills, N/V, abdominal pain, constipation, diarrhea, SOB, cough, chest pain, new weakness or paraesthesias.   + Pain over surgical site  Objective:   No results found. Recent Labs    10/18/23 0855 10/21/23 0504  WBC 8.0 5.8  HGB 10.7* 10.8*  HCT 31.7* 31.7*  PLT 157 219   Recent Labs    10/21/23 0504  NA 139  K 4.5  CL 100  CO2 30  GLUCOSE 152*  BUN 18  CREATININE 1.25*  CALCIUM  9.6    Intake/Output Summary (Last 24 hours) at 10/21/2023 0837 Last data filed at 10/21/2023 2841 Gross per 24 hour  Intake --  Output 1700 ml  Net -1700 ml        Physical Exam: Vital Signs Blood pressure (!) 107/57, pulse 69, temperature 98.7 F (37.1 C), temperature source Oral, resp. rate 18, height 6\' 4"  (1.93 m), weight 90.7 kg, SpO2 98%. Constitutional: No apparent distress. Appropriate appearance for age.  Sitting upright at bedside. HENT: No JVD. Neck Supple. Trachea midline. Atraumatic, normocephalic. Eyes: PERRLA. EOMI. Visual fields grossly intact.  Cardiovascular: Mild tachycardia, no murmurs/rub/gallops. No Edema. Peripheral pulses 2+  Respiratory: CTAB. No rales, rhonchi, or wheezing. On RA.  Abdomen: + bowel sounds, normoactive. No distention or tenderness.  Skin: C/D/I. No apparent lesions.  Left BKA wound VAC in place, with no current output.  Multiple scabs on right lower extremity, no apparent wound or skin breakdown on foot.  MSK:      Positive left BKA, minimal edema,  well-formed.      Strength:                RUE: 5/5 SA, 5/5 EF, 5/5 EE, 5/5 WE, 5/5 FF, 5/5 FA                 LUE: 5/5 SA, 5/5 EF, 5/5 EE, 5/5 WE, 5/5 FF, 5/5 FA                 RLE: 5-/5 HF, 5/5 KE, 5/5 DF, 5/5 EHL, 5/5 PF                 LLE:  5-/5 HF, 5/5 KE Neurologic exam:  Cognition: AAO to person, place, time and event.  Language: Fluent, No substitutions or neoglisms. No dysarthria.  Memory: No apparent deficits. Insight: Good insight into current condition.  Mood: Moderate anxiety, although why has pleasant mood and affect. Sensation: Equal and intact in BL UE and Les that a stocking glove pattern from the right mid calf into the toes Reflexes: 2+ in BL UE and LEs. Negative Hoffman' bilaterally CN: 2-12 grossly intact.  Coordination: No apparent tremors. No ataxia on FTN, HTS bilaterally.  Spasticity: MAS 0 in all extremities.  Assessment/Plan: 1. Functional deficits which require 3+ hours per day of interdisciplinary therapy in a comprehensive inpatient rehab setting. Physiatrist is providing close team supervision and 24 hour management of active medical problems listed below. Physiatrist and rehab team continue to assess barriers to discharge/monitor patient progress toward functional and medical goals  Care Tool:  Bathing              Bathing assist       Upper Body Dressing/Undressing Upper body dressing        Upper body assist      Lower Body Dressing/Undressing Lower body dressing            Lower body assist       Toileting Toileting    Toileting assist       Transfers Chair/bed transfer  Transfers assist           Locomotion Ambulation   Ambulation assist              Walk 10 feet activity   Assist           Walk 50 feet activity   Assist           Walk 150 feet activity   Assist           Walk 10 feet on uneven surface  activity   Assist            Wheelchair     Assist               Wheelchair 50 feet with 2 turns activity    Assist            Wheelchair 150 feet activity     Assist          Blood pressure (!) 107/57, pulse 69, temperature 98.7 F (37.1 C), temperature source Oral, resp. rate 18, height 6\' 4"  (1.93 m), weight 90.7 kg, SpO2 98%.  1. 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             Stable to continue CIR  2.  Antithrombotics: -DVT/anticoagulation:  Pharmaceutical: Lovenox  added             -antiplatelet therapy:   3. Pain Management: Continue Oxycodone  prn. On home dose gabapentin  but will increase gabapentin  to 1200 mg TID and increase Oxy to 10-15 mg q4 hours prn.   5-21: Scheduled Tylenol  1000 mg 3 times daily, oxycodone  5 mg 3 times daily.  Reduce as needed oxycodone  to 5 to 10 mg every 4 hours as needed.  Added Robaxin 750 mg as needed.  4. Mood/Behavior/Sleep: LCSW to follow for evaluation and support.              -antipsychotic agents: Caplyta  and Lithium .             --trazodone  prn for chronic insomnia.   5. Neuropsych/cognition: This patient is capable of making decisions on his own behalf. 6. Skin/Wound Care: Continue wound VAC--can change to prevena if present in room.              --continue Vitamin C  and Zinc . Prostat added for wound healing.   5-21: Wound VAC may be, off in 1 to  2 days per Dr. Lyndon Santiago; minimal output at this point  7. Fluids/Electrolytes/Nutrition:  Monitor I/O. Check CMET in am.  5-21: AM labs with mild increase in creatinine; discussed p.o. fluid intakes with patient, encouraging 8 cups of fluid per day, will retest BMP 1 to 2 days  8.  T2DM: Hgb A1c-7.7  (improved from 10.9 in Feb '25)             --monitor BS ac/hs and use SSI for elevated BS.             --continue Insulin  glargine 40 units/HS  and novolog  4 units tid ac.    5-21: Blood sugars generally well-controlled on current regimen.  Monitor for 24 hours. Recent Labs    10/21/23 1137 10/21/23 1712 10/21/23 2112  GLUCAP 176* 206* 131*     9. HTN: Monitor BP tid. Not on altace  at this time.   - 5-21: Normotensive, monitor    10/21/2023    8:15 PM 10/21/2023    3:20 PM 10/21/2023    5:32 AM  Vitals with BMI  Systolic 132 140 161  Diastolic 71 68 57  Pulse 64 71 69    10.  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 PUD/Candida esophagitis hx: Recent episode of gastroenteritis. Dr. Willy Harvest 12. Bipolar d/o/GAD w/recent severe depressive episode: On Effexor , Adderall, Clonazepam , Gabapentin  and Caplyta .              --Lithium  added May 15th by Dr. Lanetta [facility]. 13. ADHD: Adderall 30 mg bid (increased 10/05/23  for activation) 14. B12 deficiency?:Will check level in am.  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 Constipation- LBM 6 days ago- will increase bowel meds  Last bowel movement 5-21 17. Cough with low grade fever- will get CXR 1 view    LOS: 1 days A FACE TO FACE EVALUATION WAS PERFORMED  Bea Lime 10/21/2023, 8:37 AM

## 2023-10-21 NOTE — Progress Notes (Signed)
 Occupational Therapy Session Note  Patient Details  Name: [name] MRN: [id number] Date of Birth: [birth year]  Today's Date: 10/21/2023 OT Individual Time: 5366-4403 OT Individual Time Calculation (min): 73 min    Short Term Goals: Week 1:  OT Short Term Goal 1 (Week 1): STG= LTG d/t ELOS  Skilled Therapeutic Interventions/Progress Updates:  Pt received supine with no c/o pain, agreeable to OT session. He propelled the w/c 125 ft with (S) to the therapy gym. He engaged in 9 hole peg test and dynamometer testing. He then completed sit > stand x2 attempts with min A. He completed 10 ft of functional mobility with min A to the mat. He did report L hand pain/weakness. He engaged in mirror therapy EOM to assist with phantom pain and sensation. He then completed static standing activity to work on balance when removing a single UE from the RW for carryover to LB ADLs. CGA required, with pt able to self manage removing/grabbing RW with LOB. Pt completed blocked practice sit <> stand, 2x8 repetitions with no to single UE support, to challenge generalized strengthening for ADL transfers, as well as increasing functional activity tolerance and cardiorespiratory endurance. Pt required mod A and was unable to achieve full stand. He then transitioned into supine and completed 3x10 glute bridges for posterior chain strengthening. From sidelying he completed LLE flexion/extension to challenge hip flexor and glute strengthening- all for carryover to ADL transfers. He returned to sitting EOM and completed a stand pivot to the w/c, CGA using the RW. He returned to his room following and was left sitting up in the w/c with all needs met. NT present.    The Dynamometer Grip Strength Test is a quantitative and objective measure of isometric muscular strength of the hand and forearm.  -Instructions The patient was asked to sit with their back, pelvis, and knees at 90 degrees. The shoulder was adducted and  neutrally rotated with The elbow flexed to 90 degrees and forearm in neutral. The arm was not supported.  -Results The score was determined by calculating the average of 3 trials. The pt's average score was 85 lbs in the R hand and 81 lbs in the L hand.  -Norms Males Average in lbs 55-59 R 101.1 L 83.2 60-64 R 89.7 L 76.8 65-69 R 91.1 L 76.8 70-74 R 75.3 L 64.8 75+ R 65.7 L 55.0   ______________________________________________________________________________  9 Hole Peg Test is used to measure finger dexterity in pts with various neurological diagnoses. - Instructions The pt was instructed to pick up the pegs one at a time, using their dominant hand first and put them into the holes in any order until the holes were all filled. The pt then removed the pegs one at a time and returned them to the container. Both hands were tested separately.  - Results The pt completed the test in 30.35 seconds on the R, and 36.13 seconds on the L . Scores are based on the time taken to complete the activity. The timer started the moment the pt touched the first peg until the moment the last peg hit the container.  - Norms for healthy males ages 4-70+ 52-55 R 18.9 L 19.84 56-60 R 20.90 L 21.64 61-65 R 20.87 L 21.60 66-70 R 21.23 L 22.29 71+ R 25.79 L 25.95    Therapy Documentation Precautions:  Precautions Precautions: Fall Precaution/Restrictions Comments: LLE wound vac Restrictions Weight Bearing Restrictions Per Provider Order: Yes LLE Weight Bearing Per Provider Order: Non  weight bearing  Therapy/Group: Individual Therapy  Una Ganser 10/21/2023, 2:15 PM

## 2023-10-21 NOTE — Progress Notes (Signed)
 Inpatient Rehabilitation Admission Medication Review by a Pharmacist  A complete drug regimen review was completed for this patient to identify any potential clinically significant medication issues.  High Risk Drug Classes Is patient taking? Indication by Medication  Antipsychotic Yes, as an intravenous medication Compazine- n/v   Anticoagulant Yes Lovenox -DVT ppx   Antibiotic No   Opioid Yes Oxy- acute pain   Antiplatelet No   Hypoglycemics/insulin  Yes Insulin - DM   Vasoactive Medication No   Chemotherapy No   Other Yes fleet enema , sorbitol , PEG , and - constipation Maalox- indigestion Pantoprazole - reflux  Diphenhydramine - itching  Acetaminophen - pain  Robitussin- cough   trazodone  and -insomnia Adderall- attention Lithium , caplyta , effexor , clonazepam - mood  Lipitor, fenofibrate - HLD Juven, Zn, VitC- supplement  Gabapentin - neuropathy      Type of Medication Issue Identified Description of Issue Recommendation(s)  Drug Interaction(s) (clinically significant)     Duplicate Therapy     Allergy     No Medication Administration End Date     Incorrect Dose     Additional Drug Therapy Needed     Significant med changes from prior encounter (inform family/care partners about these prior to discharge). Therapeutic interchange- venlafaxine  for desvenlafaxine PTA   PTA meds not resumed- ramipril  2.5 daily  Communicate relevant medication changes to patient/family members at discharge from CIR.   Restart or discontinue PTA meds not resumed in CIR at discharge if clinically indicated.   Other       Clinically significant medication issues were identified that warrant physician communication and completion of prescribed/recommended actions by midnight of the next day:  No  Name of provider notified for urgent issues identified:   Provider Method of Notification:     Pharmacist comments:   Time spent performing this drug regimen review (minutes):  30  Chrystie Crass, PharmD Clinical Pharmacist  10/21/2023 7:39 AM

## 2023-10-21 NOTE — Evaluation (Signed)
 Physical Therapy Assessment and Plan  Patient Details  Name: [name] MRN: [id number] Date of Birth: 19-Apr-1966  PT Diagnosis: Difficulty walking and Muscle weakness Rehab Potential: Good ELOS: 7 days   Today's Date: 10/21/2023 PT Individual Time: 1002-1115 PT Individual Time Calculation (min): 73 min    Hospital Problem: Principal Problem:   S/P BKA (below knee amputation) unilateral, left (HCC) Active Problems:   Osteomyelitis of left foot (HCC)   Past Medical History:  Past Medical History:  Diagnosis Date   ADHD (attention deficit hyperactivity disorder)    Anxiety and depression    Candida esophagitis (HCC)    in setting of DKA/hyperglycemia   CIDP (chronic inflammatory demyelinating polyneuropathy) (HCC)    Depression    Diabetes type 2, controlled (HCC)    Diverticulosis    Essential hypertension 06/21/2014   Hyperlipidemia    Kidney stones    Pancreatitis    Stomach ulcer    Vitamin D deficiency    Past Surgical History:  Past Surgical History:  Procedure Laterality Date   AMPUTATION Left 10/16/2023   Procedure: LEFT BELOW KNEE AMPUTATION;  Surgeon: Timothy Ford, MD;  Location: [facility] OR;  Service: Orthopedics;  Laterality: Left;  LEFT BELOW KNEE AMPUTATION   ANKLE SURGERY     APPLICATION OF WOUND VAC Left 10/16/2023   Procedure: APPLICATION, WOUND VAC;  Surgeon: Timothy Ford, MD;  Location: [facility] OR;  Service: Orthopedics;  Laterality: Left;   CERVICAL DISC SURGERY     C4-C5   ELBOW SURGERY     ESOPHAGOGASTRODUODENOSCOPY N/A 07/16/2017   Procedure: ESOPHAGOGASTRODUODENOSCOPY (EGD);  Surgeon: Kenney Peacemaker, MD;  Location: [facility] ENDOSCOPY;  Service: Endoscopy;  Laterality: N/A;   IRRIGATION AND DEBRIDEMENT FOOT Left 07/31/2023   Procedure: IRRIGATION AND DEBRIDEMENT FOOT AND FIFTH MET RESCTION;  Surgeon: Evertt Hoe, DPM;  Location: [facility] OR;  Service: Orthopedics/Podiatry;  Laterality: Left;   RETINAL DETACHMENT SURGERY     RHINOPLASTY      VASECTOMY     WOUND DEBRIDEMENT Left 03/27/2018   Procedure: DEBRIDEMENT WOUND;  Surgeon: Charity Conch, DPM;  Location: [facility] OR;  Service: Podiatry;  Laterality: Left;    Assessment & Plan Clinical Impression: Patient is a 58 year old  R handed male with history of T2DM, CIDP, 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 Demetria Finch), IV antibiotics and hyperbaric tx ([facility]). He has had progressive breakdown ulceration to the bone and pain left knee. He was evaluated by Dr. Julio Ohm who recommended amputation. He was admitted to [facility] on 10/16/[facility] for L-BKA and has been participating in PT/OT.  He lives alone and was independent PTA with use of scooter due to NWB LLE. He currently requires min to CGA and CIR recommended due to functional decline.    Pt reports has been on gabapentin  for "years" and just not enough right now- taking Oxy 2-3x/day, and usually taking 10 mg.  Been having low grade temps- was 102.8 the 'other' day- but Is concerned because having more of a cough.  Pain 7-8/10 at rest since block has worn off- but spikes to "10+/10" at time with electrical lightening that goes through his L residual limb.    Says his mood is MUCH better since they added Lithium - feels like it's made the most difference than anything in his whole life- "wished he had it 30-40 years ago".  Patient transferred to CIR on 10/20/2023 .   Patient currently requires min with mobility secondary to muscle weakness, decreased cardiorespiratoy endurance, and decreased standing balance and decreased balance strategies.  Prior to hospitalization, patient was modified independent  with mobility and lived with Alone in a House home.  Home access is 2Stairs to enter.  Patient will benefit from skilled PT intervention to maximize safe functional mobility, minimize fall risk, and decrease caregiver burden for  planned discharge home with intermittent assist.  Anticipate patient will benefit from follow up OP at discharge.      PT Evaluation Precautions/Restrictions Precautions Precautions: Fall Precaution/Restrictions Comments: LLE wound vac Restrictions Weight Bearing Restrictions Per Provider Order: Yes LLE Weight Bearing Per Provider Order: Non weight bearing General Chart Reviewed: Yes Family/Caregiver Present: No  Pain Interference Pain Interference Pain Effect on Sleep: 2. Occasionally Pain Interference with Therapy Activities: 1. Rarely or not at all Pain Interference with Day-to-Day Activities: 2. Occasionally Home Living/Prior Functioning Home Living Available Help at Discharge: Family;Available PRN/intermittently Type of Home: House Home Access: Stairs to enter [employer] of Steps: 2 Entrance Stairs-Rails: None Home Layout: One level Bathroom Shower/Tub: Engineer, manufacturing systems: Standard Bathroom Accessibility: Yes Additional Comments: knee scooter, has been on it for the last 6 months  Lives With: Alone Prior Function Level of Independence: Requires assistive device for independence;Independent with transfers  Able to Take Stairs?: Yes Driving: Yes Vision/Perception  Perception Perception: Within Functional Limits Praxis Praxis: WFL  Cognition Overall Cognitive Status: Within Functional Limits for tasks assessed Arousal/Alertness: Awake/alert Orientation Level: Oriented X4 Safety/Judgment: Appears intact Sensation Sensation Light Touch: Impaired by gross assessment Additional Comments: Decreased distal sensation in d/t RLE peripheral neuropathy Coordination Gross Motor Movements are Fluid and Coordinated: Yes Fine Motor Movements are Fluid and Coordinated: Yes Motor  Motor Motor: Within Functional Limits  Trunk/Postural Assessment  Cervical Assessment Cervical Assessment: Within Functional Limits Thoracic Assessment Thoracic  Assessment: Within Functional Limits Lumbar Assessment Lumbar Assessment: Within Functional Limits Postural Control Postural Control: Within Functional Limits  Balance Balance Balance Assessed: Yes Static Sitting Balance Static Sitting - Level of Assistance: 7: Independent Dynamic Sitting Balance Dynamic Sitting - Level of Assistance: 6: Modified independent (Device/Increase time) Static Standing Balance Static Standing - Balance Support: Bilateral upper extremity supported Static Standing - Level of Assistance: 4: Min assist (CGA) Dynamic Standing Balance Dynamic Standing - Balance Support: During functional activity;Bilateral upper extremity supported Dynamic Standing - Level of Assistance: 4: Min assist Extremity Assessment  RLE Assessment RLE Assessment: Within Functional Limits General Strength Comments: Endurance impaired LLE Assessment LLE Assessment: Exceptions to [facility] General Strength Comments: not tested secondary to BKA  Care Tool Care Tool Bed Mobility Roll left and right activity   Roll left and right assist level: Supervision/Verbal cueing    Sit to lying activity   Sit to lying assist level: Supervision/Verbal cueing    Lying to sitting on side of bed activity   Lying to sitting on side of bed assist level: the ability to move from lying on the back to sitting on the side of the bed with no back support.: Supervision/Verbal cueing     Care Tool Transfers Sit to stand transfer   Sit to stand assist level: Minimal Assistance - Patient > 75%    Chair/bed transfer   Chair/bed transfer assist level: Minimal Assistance - Patient > 75%    Car transfer   Car transfer assist level: Minimal Assistance - Patient > 75%  Care Tool Locomotion Ambulation   Assist level: Minimal Assistance - Patient > 75% Assistive device: Walker-rolling Max distance: 100'  Walk 10 feet activity   Assist level: Minimal Assistance - Patient > 75% Assistive device: No Device    Walk 50 feet with 2 turns activity   Assist level: Minimal Assistance - Patient > 75% Assistive device: No Device  Walk 150 feet activity Walk 150 feet activity did not occur: Safety/medical concerns      Walk 10 feet on uneven surfaces activity   Assist level: Minimal Assistance - Patient > 75% Assistive device: Walker-rolling  Stairs   Assist level: Minimal Assistance - Patient > 75% Stairs assistive device: 2 hand rails Max number of stairs: 16  Walk up/down 1 step activity   Walk up/down 1 step (curb) assist level: Minimal Assistance - Patient > 75% Walk up/down 1 step or curb assistive device: 2 hand rails  Walk up/down 4 steps activity   Walk up/down 4 steps assist level: Minimal Assistance - Patient > 75% Walk up/down 4 steps assistive device: 2 hand rails  Walk up/down 12 steps activity   Walk up/down 12 steps assist level: Minimal Assistance - Patient > 75% Walk up/down 12 steps assistive device: 2 hand rails  Pick up small objects from floor   Pick up small object from the floor assist level: Dependent - Patient 0%    Wheelchair Is the patient using a wheelchair?: Yes     Wheelchair assist level: Supervision/Verbal cueing Max wheelchair distance: 300'  Wheel 50 feet with 2 turns activity   Assist Level: Supervision/Verbal cueing  Wheel 150 feet activity   Assist Level: Supervision/Verbal cueing    Refer to Care Plan for Long Term Goals  SHORT TERM GOAL WEEK 1 PT Short Term Goal 1 (Week 1): STGs = LTGs  Recommendations for other services: Neuropsych and Therapeutic Recreation  Stress management  Skilled Therapeutic Intervention  Evaluation completed (see details above and below) with education on PT POC and goals and individual treatment initiated with focus on balance, transfers, ambulation, car transfer, and stair training. Pt received seated in WC and agrees to therapy. Reports pain in Lt residual limb. Spasming in nature. PT provides cues for  desensitization as well as rest breaks to manage pain. Pt self propels WC x300' to gym with cues for increased shoulder extension to improved body mechanics and efficiency of propulsion. Pt completes ramp navigation and car transfer with minA and cues for sequencing. During rest break, PT has discussion with pt on concerns for discharge and recommendation for amputation recovery and mobility. Pt stands with minA and RW, with cues for hand placement, and ambulates x100' with RW and minA, with cues for posture and upright gaze to improve balance, as well as safe RW management and foot positioning during gait cycle. Seated rest break. Pt completes x16 3" steps with bilateral handrails and minA, with cues for step sequencing and safety. Pt self propels WC back to room. Left seated with alarm intact and all needs within reach.   Mobility Bed Mobility Bed Mobility: Supine to Sit;Sit to Supine Supine to Sit: Supervision/Verbal cueing Sit to Supine: Supervision/Verbal cueing Transfers Transfers: Sit to Stand;Stand to Sit;Stand Pivot Transfers Sit to Stand: Minimal Assistance - Patient > 75% Stand to Sit: Minimal Assistance - Patient > 75% Stand Pivot Transfers: Minimal Assistance - Patient > 75% Stand Pivot Transfer Details: Verbal cues for sequencing;Verbal cues for technique;Verbal cues for precautions/safety;Verbal cues for safe use of DME/AE Transfer (  Assistive device): Rolling walker Locomotion  Gait Ambulation: Yes Gait Assistance: Minimal Assistance - Patient > 75% Gait Distance (Feet): 100 Feet Assistive device: Rolling walker Gait Assistance Details: Verbal cues for sequencing;Verbal cues for technique;Verbal cues for precautions/safety;Verbal cues for safe use of DME/AE;Tactile cues for posture Gait Gait: Yes Gait Pattern: Impaired Gait Pattern: Step-to pattern Stairs / Additional Locomotion Stairs: Yes Stairs Assistance: Minimal Assistance - Patient > 75% Stair Management Technique:  Two rails Number of Stairs: 16 Height of Stairs: 3 Ramp: Minimal Assistance - Patient >75% Curb: Minimal Assistance - Patient >75% Wheelchair Mobility Wheelchair Mobility: Yes Wheelchair Assistance: Doctor, general practice: Both upper extremities Wheelchair Parts Management: Needs assistance Distance: 300'   Discharge Criteria: Patient will be discharged from PT if patient refuses treatment 3 consecutive times without medical reason, if treatment goals not met, if there is a change in medical status, if patient makes no progress towards goals or if patient is discharged from hospital.  The above assessment, treatment plan, treatment alternatives and goals were discussed and mutually agreed upon: by patient  Neva Barban, PT, DPT 10/21/2023, 4:47 PM

## 2023-10-21 NOTE — Progress Notes (Signed)
 Inpatient Rehabilitation  Patient information reviewed and entered into eRehab system by [facility]. Karen Kays., CCC/SLP, PPS Coordinator.  Information including medical coding, functional ability and quality indicators will be reviewed and updated through discharge.

## 2023-10-22 DIAGNOSIS — Z89512 Acquired absence of left leg below knee: Secondary | ICD-10-CM | POA: Diagnosis not present

## 2023-10-22 DIAGNOSIS — E440 Moderate protein-calorie malnutrition: Secondary | ICD-10-CM | POA: Insufficient documentation

## 2023-10-22 LAB — CBC
HCT: 31.8 % — ABNORMAL LOW (ref 39.0–52.0)
Hemoglobin: 10.8 g/dL — ABNORMAL LOW (ref 13.0–17.0)
MCH: 29.2 pg (ref 26.0–34.0)
MCHC: 34 g/dL (ref 30.0–36.0)
MCV: 85.9 fL (ref 80.0–100.0)
Platelets: 248 10*3/uL (ref 150–400)
RBC: 3.7 MIL/uL — ABNORMAL LOW (ref 4.22–5.81)
RDW: 13.5 % (ref 11.5–15.5)
WBC: 4.8 10*3/uL (ref 4.0–10.5)
nRBC: 0 % (ref 0.0–0.2)

## 2023-10-22 LAB — GLUCOSE, CAPILLARY
Glucose-Capillary: 178 mg/dL — ABNORMAL HIGH (ref 70–99)
Glucose-Capillary: 96 mg/dL (ref 70–99)
Glucose-Capillary: 176 mg/dL — ABNORMAL HIGH (ref 70–99)
Glucose-Capillary: 198 mg/dL — ABNORMAL HIGH (ref 70–99)

## 2023-10-22 LAB — BASIC METABOLIC PANEL WITH GFR
Anion gap: 7 (ref 5–15)
BUN: 21 mg/dL — ABNORMAL HIGH (ref 6–20)
CO2: 29 mmol/L (ref 22–32)
Calcium: 9.3 mg/dL (ref 8.9–10.3)
Chloride: 102 mmol/L (ref 98–111)
Creatinine, Ser: 1.18 mg/dL (ref 0.61–1.24)
GFR, Estimated: >60 mL/min (ref >60)
Glucose, Bld: 200 mg/dL — ABNORMAL HIGH (ref 70–99)
Potassium: 4 mmol/L (ref 3.5–5.1)
Sodium: 138 mmol/L (ref 135–145)

## 2023-10-22 MED ORDER — GLUCERNA SHAKE PO LIQD
237.0000 mL | Freq: Two times a day (BID) | ORAL | Status: DC
  Administered 2023-10-22 – 2023-10-30 (×14): 237 mL via ORAL

## 2023-10-22 MED ORDER — PROSOURCE PLUS PO LIQD
30.0000 mL | Freq: Two times a day (BID) | ORAL | Status: DC
  Administered 2023-10-24 – 2023-10-31 (×15): 30 mL via ORAL
  Filled 2023-10-22 (×18): qty 30

## 2023-10-22 MED ORDER — ADULT MULTIVITAMIN W/MINERALS CH
1.0000 | ORAL_TABLET | Freq: Every day | ORAL | Status: DC
  Administered 2023-10-22 – 2023-10-31 (×10): 1 via ORAL
  Filled 2023-10-22 (×10): qty 1

## 2023-10-22 NOTE — Progress Notes (Signed)
 Initial Nutrition Assessment  DOCUMENTATION CODES:   Non-severe (moderate) malnutrition in context of social or environmental circumstances  INTERVENTION:   Glucerna Shake po BID, each supplement provides 220 kcal and 10 grams of protein. Prosource Plus 30 ml PO BID, each packet provides 100 kcal and 15 gm protein. Continue Juven 1 packet PO BID, each packet provides 80 calories, 8 grams of carbohydrate, 2.5  grams of protein (collagen), 7 grams of L-arginine and 7 grams of L-glutamine; supplement contains CaHMB, Vitamins C, E, B12 and Zinc  to promote wound healing. MVI with minerals daily. DM diet education provided.   NUTRITION DIAGNOSIS:   Moderate Malnutrition related to social / environmental circumstances (depression, lives alone) as evidenced by mild muscle depletion, moderate muscle depletion, mild fat depletion, moderate fat depletion.  GOAL:   Patient will meet greater than or equal to 90% of their needs  MONITOR:   PO intake, Supplement acceptance, Skin  REASON FOR ASSESSMENT:   Consult Diet education  ASSESSMENT:   58 yo male admitted with functional deficits d/t L BKA. PMH includes DM-2, ADHD, HLD, stomach ulcer, pancreatitis, anxiety, depression, chronic inflammatory demyelinating polyneuropathy, vitamin D deficiency, HTN.  S/P L BKA 10/16/23 with application of wound VAC.   Patient reports that he was eating poorly PTA. He lives alone and doesn't want to cook for just himself. Since admission to rehab, he has been eating better. He has been drinking Juven since February when he had surgery on his L foot. 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 Discussed the importance of adequate protein and calorie intake for wound healing. Provided "Carbohydrate Counting for People with Diabetes" handout from the Academy of Nutrition and Dietetics. Patient was appreciative of RD visit and information provided. We discussed carb  counting using the Calorie King app. He hopes to get an Omnipod insulin  pump in November.   Currently on a carb modified diet, Juven BID. He is eating 50-100% of meals and drinking Juven twice per day. He agreed to additional protein supplementation to ensure adequate protein and calorie intake to promote healing.   Labs reviewed.  CBG: [phone]  Medications reviewed and include vitamin C , novolog , semglee , juven, miralax , zinc  sulfate.  Weight history reviewed. Patient weighed 94.8 kg 3 months ago. Currently 90.7 kg. Part of this weight loss is r/t BKA.  NUTRITION - FOCUSED PHYSICAL EXAM:  Flowsheet Row Most Recent Value  Orbital Region Moderate depletion  Upper Arm Region Moderate depletion  Thoracic and Lumbar Region Mild depletion  Buccal Region Moderate depletion  Temple Region Moderate depletion  Clavicle Bone Region Mild depletion  Clavicle and Acromion Bone Region Mild depletion  Scapular Bone Region Mild depletion  Dorsal Hand Mild depletion  Patellar Region Moderate depletion  Anterior Thigh Region Moderate depletion  Posterior Calf Region Moderate depletion  Edema (RD Assessment) None  Hair Reviewed  Eyes Reviewed  Mouth Reviewed  Skin Reviewed  Nails Reviewed       Diet Order:   Diet Order             Diet Carb Modified Fluid consistency: Thin; Room service appropriate? Yes  Diet effective now                   EDUCATION NEEDS:   Education needs have been addressed  Skin:  Skin Assessment: Skin Integrity Issues: Skin Integrity Issues:: Wound VAC Wound Vac: L BKA  Last BM:  5/21 type 2  Height:  Ht Readings from Last 1 Encounters:  10/21/23 6\' 4"  (1.93 m)    Weight:   Wt Readings from Last 1 Encounters:  10/21/23 90.7 kg    Ideal Body Weight:  85.9 kg (adjusted for L BKA)  BMI:  Body mass index is 24.34 kg/m.  Estimated Nutritional Needs:   Kcal:  2400-2600  Protein:  130-160 g  Fluid:  >/= 2.4 L   Barnet Boots  RD, LDN, CNSC Contact via secure chat. If unavailable, use group chat "RD Inpatient."

## 2023-10-22 NOTE — Progress Notes (Signed)
 Orthopedic Tech Progress Note Patient Details:  [name] [birth year] [id number] Called in order to [employer] for shrinker and ampushield Patient ID: [name], male   DOB: 02-11-1966, 58 y.o.   MRN: [id number]  [name] 10/22/2023, 2:04 PM

## 2023-10-22 NOTE — Progress Notes (Signed)
 [facility]patient Rehabilitation Center Individual Statement of Services  Patient Name:  [name]  Date:  10/22/2023  Welcome to the [facility]patient [facility].  Our goal is to provide you with an individualized program based on your diagnosis and situation, designed to meet your specific needs.  With this comprehensive rehabilitation program, you will be expected to participate in at least 3 hours of rehabilitation therapies Monday-Friday, with modified therapy programming on the weekends.  Your rehabilitation program will include the following services:  Physical Therapy (PT), Occupational Therapy (OT), 24 hour per day rehabilitation nursing, Therapeutic Recreaction (TR), Neuropsychology, Care Coordinator, Rehabilitation Medicine, Nutrition Services, and Pharmacy Services  Weekly team conferences will be held on Tuesdays to discuss your progress.  Your Inpatient Rehabilitation Care Coordinator will talk with you frequently to get your input and to update you on team discussions.  Team conferences with you and your family in attendance may also be held.  Expected length of stay: 7 days  Overall anticipated outcome: Mod I assist  Depending on your progress and recovery, your program may change. Your Inpatient Rehabilitation Care Coordinator will coordinate services and will keep you informed of any changes. Your Inpatient Rehabilitation Care Coordinator's name and contact numbers are listed  below.  The following services may also be recommended but are not provided by the Inpatient Rehabilitation Center:   Home Health Rehabiltiation Services Outpatient Rehabilitation Services   Arrangements will be made to provide these services after discharge if needed.  Arrangements include referral to agencies that provide these services.  Your insurance has been verified to be:  [place] [employer]/Healthy Blue Your primary doctor is:  [facility]  Pertinent  information will be shared with your doctor and your insurance company.  Inpatient Rehabilitation Care Coordinator:  Kathey Pang [phone] or ([phone]  Information discussed with and copy given to patient by: Naoma Bacca, 10/22/2023, 2:30 PM

## 2023-10-22 NOTE — Progress Notes (Signed)
 Inpatient Rehabilitation Care Coordinator Assessment and Plan Patient Details  Name: [name] MRN: [id number] Date of Birth: March 06, 1966  Today's Date: 10/22/2023  Hospital Problems: Principal Problem:   S/P BKA (below knee amputation) unilateral, left (HCC) Active Problems:   Osteomyelitis of left foot (HCC)  Past Medical History:  Past Medical History:  Diagnosis Date   ADHD (attention deficit hyperactivity disorder)    Anxiety and depression    Candida esophagitis (HCC)    in setting of DKA/hyperglycemia   CIDP (chronic inflammatory demyelinating polyneuropathy) (HCC)    Depression    Diabetes type 2, controlled (HCC)    Diverticulosis    Essential hypertension 06/21/2014   Hyperlipidemia    Kidney stones    Pancreatitis    Stomach ulcer    Vitamin D deficiency    Past Surgical History:  Past Surgical History:  Procedure Laterality Date   AMPUTATION Left 10/16/2023   Procedure: LEFT BELOW KNEE AMPUTATION;  Surgeon: Timothy Ford, MD;  Location: [facility] OR;  Service: Orthopedics;  Laterality: Left;  LEFT BELOW KNEE AMPUTATION   ANKLE SURGERY     APPLICATION OF WOUND VAC Left 10/16/2023   Procedure: APPLICATION, WOUND VAC;  Surgeon: Timothy Ford, MD;  Location: [facility] OR;  Service: Orthopedics;  Laterality: Left;   CERVICAL DISC SURGERY     C4-C5   ELBOW SURGERY     ESOPHAGOGASTRODUODENOSCOPY N/A 07/16/2017   Procedure: ESOPHAGOGASTRODUODENOSCOPY (EGD);  Surgeon: Kenney Peacemaker, MD;  Location: [facility] ENDOSCOPY;  Service: Endoscopy;  Laterality: N/A;   IRRIGATION AND DEBRIDEMENT FOOT Left 07/31/2023   Procedure: IRRIGATION AND DEBRIDEMENT FOOT AND FIFTH MET RESCTION;  Surgeon: Evertt Hoe, DPM;  Location: [facility] OR;  Service: Orthopedics/Podiatry;  Laterality: Left;   RETINAL DETACHMENT SURGERY     RHINOPLASTY     VASECTOMY     WOUND DEBRIDEMENT Left 03/27/2018   Procedure: DEBRIDEMENT WOUND;  Surgeon: Charity Conch, DPM;  Location: [facility] OR;  Service: Podiatry;   Laterality: Left;   Social History:  reports that he has quit smoking. He has quit using smokeless tobacco. He reports that he does not currently use alcohol. He reports that he does not use drugs.  Family / Support Systems Marital Status: Divorced Patient Roles: Parent Children: Daughter in [place] Other Supports: Sister: Molly Ability/Limitations of Caregiver: [name] is a Development worker, community in [place] area; planning to assist patient in [place] and work remotely Caregiver Availability: 24/7  Social History Preferred language: English Religion: Forensic psychologist - How often do you need to have someone help you when you read instructions, pamphlets, or other written material from your doctor or pharmacy?: Never Writes: Yes Employment Status:  (self employee)   Abuse/Neglect Abuse/Neglect Assessment Can Be Completed: Yes Physical Abuse: Denies Verbal Abuse: Denies Sexual Abuse: Denies Exploitation of patient/patient's resources: Denies Self-Neglect: Denies  Patient response to: Social Isolation - How often do you feel lonely or isolated from those around you?: Sometimes  Emotional Status Pt's affect, behavior and adjustment status: Affect is subdued, very verbouse, tangential,fidgeting and slightly manic and gets off train of thought Recent Psychosocial Issues: Reports attempted inpatient treatment at [facility] in February Psychiatric History: Bipolar, MDD with passive suicidal ideation Substance Abuse History: n/a  Patient / Family Perceptions, Expectations & Goals Pt/Family understanding of illness & functional limitations: Patient reports an understanding of illness (dealing with foot issues for over a year) and functional limitations with BKA Premorbid pt/family roles/activities: Parent, siblling, Anticipated changes in roles/activities/participation: Will need  assistance from others for transportation, groceries, home management, etc. Pt/family  expectations/goals: Patient would like to be as mobile as possible with BKA and able to get self to bathroom, get dressed, prepare small meals, etc.  [employer]: None Premorbid Home Care/DME Agencies: None Transportation available at discharge: Sister will provide for transportation needs Is the patient able to respond to transportation needs?: Yes In the past 12 months, has lack of transportation kept you from medical appointments or from getting medications?: No In the past 12 months, has lack of transportation kept you from meetings, work, or from getting things needed for daily living?: No Resource referrals recommended: Neuropsychology, Support group (specify) (Limb Loss support group)  Discharge Planning Living Arrangements: Alone Support Systems: Other relatives (Sister; Molly) Type of Residence: Private residence Insurance Resources: Medicaid (specify county) Medical sales representative) Financial Screen Referred: No Money Management: Patient Does the patient have any problems obtaining your medications?: No Home Management: Patient manages the home Patient/Family Preliminary Plans: D/C home with sister coming in to assist Care Coordinator Barriers to Discharge: Decreased caregiver support, Home environment access/layout, Weight bearing restrictions Care Coordinator Barriers to Discharge Comments: 1 level 2 ste off concrete pad Care Coordinator Anticipated Follow Up Needs: HH/OP, Support Group DC Planning Additional Notes/Comments: Dietician consult Expected length of stay: 7 days  Clinical Impression Patient post BKA and doing well overall despite history of DM and Charcot Marie Tooth.  Also has dental issues which impairs nutrition and lack of confidence in self sufficiency with anxiety and insomnia.  Forrestine Ike B 10/22/2023, 2:24 PM

## 2023-10-22 NOTE — Progress Notes (Signed)
 Introduced patient to care coordinator role, showed education binder and discussed team conference. Discussed limb guard, incision care following removal of wound vac, A1C and other glucose monitoring, diet, regular foot checks for remaining limb, contracture prevention and phantom pain.

## 2023-10-22 NOTE — Progress Notes (Signed)
 PROGRESS NOTE   Subjective/Complaints:  No acute complaints.  No events overnight.  Patient complains of ongoing pain in his residual limb, but states it is somewhat better controlled with regimen started yesterday.  Does not want further medication adjustments at this time.    Had a lot of education this morning on shrinker sock and use of MP shield, which she did not obtain on inpatient; will order those today.  Otherwise, complains of difficulty sleeping due to loudness of the clock in his room.  ROS: Denies fevers, chills, N/V, abdominal pain, constipation, diarrhea, SOB, cough, chest pain, new weakness or paraesthesias.   + Pain over surgical site + Insomnia  Objective:   No results found. Recent Labs    10/21/23 0504 10/22/23 0556  WBC 5.8 4.8  HGB 10.8* 10.8*  HCT 31.7* 31.8*  PLT 219 248   Recent Labs    10/21/23 0504 10/22/23 0556  NA 139 138  K 4.5 4.0  CL 100 102  CO2 30 29  GLUCOSE 152* 200*  BUN 18 21*  CREATININE 1.25* 1.18  CALCIUM  9.6 9.3    Intake/Output Summary (Last 24 hours) at 10/22/2023 1718 Last data filed at 10/22/2023 1632 Gross per 24 hour  Intake 1196 ml  Output 3475 ml  Net -2279 ml        Physical Exam: Vital Signs Blood pressure 117/78, pulse 69, temperature (!) 97.5 F (36.4 C), temperature source Oral, resp. rate 19, height 6\' 4"  (1.93 m), weight 90.7 kg, SpO2 96%. Constitutional: No apparent distress. Appropriate appearance for age.  Sitting upright at bedside. HENT: No JVD. Neck Supple. Trachea midline. Atraumatic, normocephalic. Eyes: PERRLA. EOMI. Visual fields grossly intact.  Cardiovascular: Regular rate and rhythm, no murmurs/rub/gallops. No Edema. Peripheral pulses 2+  Respiratory: CTAB. No rales, rhonchi, or wheezing. On RA.  Abdomen: + bowel sounds, normoactive. No distention or tenderness.  Skin: C/D/I. No apparent lesions.  Left BKA wound VAC in place, with  no current output.  Multiple scabs on right lower extremity, no apparent wound or skin breakdown on foot.  MSK:      Positive left BKA, minimal edema, well-formed.      Strength:                RUE: 5/5 SA, 5/5 EF, 5/5 EE, 5/5 WE, 5/5 FF, 5/5 FA                 LUE: 5/5 SA, 5/5 EF, 5/5 EE, 5/5 WE, 5/5 FF, 5/5 FA                 RLE: 5-/5 HF, 5/5 KE, 5/5 DF, 5/5 EHL, 5/5 PF                 LLE:  5-/5 HF, 5/5 KE Neurologic exam:  Cognition: AAO to person, place, time and event.  Language: Fluent, No substitutions or neoglisms. No dysarthria.  Memory: No apparent deficits. Insight: Good insight into current condition.  Mood: Moderate anxiety, although why has pleasant mood and affect. Sensation: Equal and intact in BL UE and Les that a stocking glove pattern from the right mid calf into the toes  Unchanged 5-22         Assessment/Plan: 1. Functional deficits which require 3+ hours per day of interdisciplinary therapy in a comprehensive inpatient rehab setting. Physiatrist is providing close team supervision and 24 hour management of active medical problems listed below. Physiatrist and rehab team continue to assess barriers to discharge/monitor patient progress toward functional and medical goals  Care Tool:  Bathing    Body parts bathed by patient: Right arm, Left arm, Abdomen, Front perineal area, Chest, Buttocks, Right upper leg, Left upper leg, Right lower leg, Face, Left lower leg         Bathing assist Assist Level: Minimal Assistance - Patient > 75%     Upper Body Dressing/Undressing Upper body dressing   What is the patient wearing?: Pull over shirt    Upper body assist Assist Level: Supervision/Verbal cueing    Lower Body Dressing/Undressing Lower body dressing      What is the patient wearing?: Pants     Lower body assist Assist for lower body dressing: Minimal Assistance - Patient > 75%     Toileting Toileting    Toileting assist Assist for  toileting: Minimal Assistance - Patient > 75%     Transfers Chair/bed transfer  Transfers assist     Chair/bed transfer assist level: Contact Guard/Touching assist     Locomotion Ambulation   Ambulation assist      Assist level: Minimal Assistance - Patient > 75% Assistive device: Walker-rolling Max distance: 100'   Walk 10 feet activity   Assist     Assist level: Minimal Assistance - Patient > 75% Assistive device: No Device   Walk 50 feet activity   Assist    Assist level: Minimal Assistance - Patient > 75% Assistive device: No Device    Walk 150 feet activity   Assist Walk 150 feet activity did not occur: Safety/medical concerns         Walk 10 feet on uneven surface  activity   Assist     Assist level: Minimal Assistance - Patient > 75% Assistive device: Walker-rolling   Wheelchair     Assist Is the patient using a wheelchair?: Yes      Wheelchair assist level: Supervision/Verbal cueing Max wheelchair distance: 300'    Wheelchair 50 feet with 2 turns activity    Assist        Assist Level: Supervision/Verbal cueing   Wheelchair 150 feet activity     Assist      Assist Level: Supervision/Verbal cueing   Blood pressure 117/78, pulse 69, temperature (!) 97.5 F (36.4 C), temperature source Oral, resp. rate 19, height 6\' 4"  (1.93 m), weight 90.7 kg, SpO2 96%.  1. 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             Stable to continue CIR  - 5-22: Order for additional shrinker sock and MP shield today  2.  Antithrombotics: -DVT/anticoagulation:  Pharmaceutical: Lovenox  added             -antiplatelet therapy:   3. Pain Management: Continue Oxycodone  prn. On home dose gabapentin  but will increase gabapentin  to 1200 mg TID and increase Oxy to 10-15 mg q4 hours prn.    5-21: Scheduled Tylenol   1000 mg 3 times daily, oxycodone  5 mg 3 times daily.  Reduce as needed oxycodone  to 5 to 10 mg every 4 hours as needed.  Added Robaxin 750 mg as needed.  5-22: Pain better controlled on current regimen.  4. Mood/Behavior/Sleep: LCSW to follow for evaluation and support.              -antipsychotic agents: Caplyta  and Lithium .             --trazodone  prn for chronic insomnia.   5. Neuropsych/cognition: This patient is capable of making decisions on his own behalf. 6. Skin/Wound Care: Continue wound VAC--can change to prevena if present in room.              --continue Vitamin C  and Zinc . Prostat added for wound healing.   5-21: Wound VAC 1 week per Dr. Lyndon Santiago (5/27); minimal output at this point  7. Fluids/Electrolytes/Nutrition:  Monitor I/O. Check CMET in am.  5-21: AM labs with mild increase in creatinine; discussed p.o. fluid intakes with patient, encouraging 8 cups of fluid per day, will retest BMP 1 to 2 days  5-22: BUN, creatinine improved.  Monitor.  8.  T2DM: Hgb A1c-7.7  (improved from 10.9 in Feb '25)             --monitor BS ac/hs and use SSI for elevated BS.             --continue Insulin  glargine 40 units/HS and novolog  4 units tid ac.    5-21: Blood sugars generally well-controlled on current regimen.  Monitor for 24 hours. Recent Labs    10/22/23 0608 10/22/23 1159 10/22/23 1628  GLUCAP 178* 96 176*     9. HTN: Monitor BP tid. Not on altace  at this time.   - 5-21: Normotensive, monitor    10/22/2023    1:21 PM 10/22/2023    4:40 AM 10/21/2023    8:15 PM  Vitals with BMI  Systolic 117 118 161  Diastolic 78 64 71  Pulse 69 67 64    10.  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 PUD/Candida esophagitis hx: Recent episode of gastroenteritis. Dr. Willy Harvest 12. Bipolar d/o/GAD w/recent severe depressive episode: On Effexor , Adderall, Clonazepam , Gabapentin  and Caplyta .              --Lithium  added May 15th by Dr. Lanetta [facility]. 13.  ADHD: Adderall 30 mg bid (increased 10/05/23  for activation) 14. B12 deficiency?:Will check level in am.  377, normal. 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 Constipation- LBM 6 days ago- will increase bowel meds  Last bowel movement 5-21 17. Cough with low grade fever- will get CXR 1 view-resolved    LOS: 2 days A FACE TO FACE EVALUATION WAS PERFORMED  Bea Lime 10/22/2023, 5:18 PM

## 2023-10-22 NOTE — Progress Notes (Signed)
 Physical Therapy Session Note  Patient Details  Name: [name] MRN: [id number] Date of Birth: 05-Sep-1965  Today's Date: 10/22/2023 PT Individual Time: 2841-3244 PT Individual Time Calculation (min): 72 min   Short Term Goals: Week 1:  PT Short Term Goal 1 (Week 1): STGs = LTGs  Skilled Therapeutic Interventions/Progress Updates:   Received pt semi-reclined in bed, pt agreeable to PT treatment, and reported pain 6-7/10 in L residual limb (premedicated). Session with emphasis on functional mobility/transfers, generalized strengthening and endurance, dynamic standing balance/coordination, limb loss education, and ambulation. Noted pt without limb guard - requested one from MD.  Provided pt with the following limb loss resources and increased time spent reviewing handouts: -[facility] Amputee Support Group -Chief Financial Officer Wellness and Educational Brochure -Limb Protector Reference Handout -Contracture Prevention Handout -Engineer, site and Care Guidelines -General Rehab Timeline for Prosthetic Fitting Also discussed wound vac removal timeline. Pt very receptive and appreciative of all education.   Pt transferred semi-reclined<>sitting R EOB with HOB elevated and supervision and performed stand<>pivot into WC with RW and min A due to posterior LOB. Pt performed WC mobility 170ft using BUE and supervision to therapy gym with emphasis on UE strength and coordination. Pt performed remainder of transfers with RW and CGA throughout session. Pt ambulated 3ft with RW and CGA to mat and transitioned into prone mod I. Educated pt on importance of avoid excessive "hopping" due to neuropathy and to avoid excessive compressive forces through knee and angle - pt verbalized understanding. Also discussed foot care and skin inspection - pt reported his sister can bring him a shoe when she returns from vacation this weekend.   Pt performed the following exercises with emphasis on LE strength,  ROM, and contracture prevention while stretching hip flexors using mirror for visual feedback: -prone knee flexion 2x15 bilaterally with 4lb ankle weight on RLE  -prone hip extension 2x12 on LLE -prone on elbows<>prone on extended elbows 2x10x5 second hold  Transferred prone<>sitting EOM mod I and transferred back into WC. Returned to room and ambulated into bathroom with RW and CGA. Concluded session with pt sitting on toilet left in care of NT due to time restrictions.   Therapy Documentation Precautions:  Precautions Precautions: Fall Precaution/Restrictions Comments: LLE wound vac Restrictions Weight Bearing Restrictions Per Provider Order: Yes LLE Weight Bearing Per Provider Order: Non weight bearing  Therapy/Group: Individual Therapy Nicolas Barren Zaunegger Nena Bank PT, DPT 10/22/2023, 7:17 AM

## 2023-10-22 NOTE — Progress Notes (Signed)
 Occupational Therapy Session Note  Patient Details  Name: [name] MRN: [id number] Date of Birth: January 21, 1966  Today's Date: 10/22/2023 OT Individual Time: 0853-1006+ 1422-1510 OT Individual Time Calculation (min): 73 min    Short Term Goals: Week 1:  OT Short Term Goal 1 (Week 1): STG= LTG d/t ELOS  Skilled Therapeutic Interventions/Progress Updates:  Session 1: Pt greeted seated EOB, pt agreeable to OT intervention.      Transfers/bed mobility/functional mobility: pt completed sit>stand from EOB with RW and CGA.pt completed stand pivot to w/c with RW and CGA.    ADLs:  Grooming: pt completed seated grooming EOB with set- up assist. Pt completed seated shaving task at sink MODI, discussed how to adapt this task at home with pt reporting pedestal sink at home and pt able to roll up to sink at home from w/c.   Transfers: pt completed stand pivot transfer to TTB in bathroom with Rw and CGA. Education provided on using lateral leans for posterior periarea bathing as pt does not currently have grab bars in his shower.   Education: education provided on wearing shrinker and changing schedule,pt only has one, reached out for a second one. Discussed DME needs being a w/c, RW, and TTB.    Ended session with pt supine in bed with all needs within reach and bed alarm activated.                    Session 2: Pt greeted supine in bed, pt agreeable to OT intervention.      Transfers/bed mobility/functional mobility: pt completed bed mobility MODI and sit>stands with RW and CGA. Pt completed stand pivot transfers throughout session with RW and CGA.   Therapeutic activity: pt completed dynamic balance task with pt instructed to stand at mirror to place squigz on mirror with pt instructed to alternate which hand to use to reach to mirror. Pt completed task with CGA with no LOB.    Education: educated pt on donning/doffing leg rests on w/c. Pt able to return demo all education and  managed leg rests the remainder of session with only MIN cues.    Exercises: pt completed below therex to promote core strength and endurance for higher level ADLs: X20 modified crunches with pt leaning back on wedge and then using core strength to return to midline.graded task up and had pt complete an additional 20 reps + pushing 3lb weighted ball OH.  Pt completed 1 min of russian twists from EOB with 3 lb weighted ball Pt completed 3 mins of isometric therex with 3lb dowel rod with pt using dowel to toss ball back/forth to therapist with pt seated on compliant surface to further challenge core strength and dynamic sitting balance. Pt completed task with supervision and no LOB.   Ended session with pt supine in bed with all needs within reach and bed alarm activated.                    Therapy Documentation Precautions:  Precautions Precautions: Fall Precaution/Restrictions Comments: LLE wound vac Restrictions Weight Bearing Restrictions Per Provider Order: No LLE Weight Bearing Per Provider Order: Non weight bearing  Pain: Session 1: unrated pain reported in LLE, paid meds provided Session 2: unrated pain reported in LLE, rest breaks provided and education provided on sensory reeducation strategies.    Therapy/Group: Individual Therapy  Mollie Anger [facility] 10/22/2023, 11:24 AM

## 2023-10-23 ENCOUNTER — Inpatient Hospital Stay (HOSPITAL_COMMUNITY)

## 2023-10-23 DIAGNOSIS — F3181 Bipolar II disorder: Secondary | ICD-10-CM

## 2023-10-23 LAB — GLUCOSE, CAPILLARY
Glucose-Capillary: 196 mg/dL — ABNORMAL HIGH (ref 70–99)
Glucose-Capillary: 123 mg/dL — ABNORMAL HIGH (ref 70–99)
Glucose-Capillary: 128 mg/dL — ABNORMAL HIGH (ref 70–99)
Glucose-Capillary: 200 mg/dL — ABNORMAL HIGH (ref 70–99)

## 2023-10-23 MED ORDER — DOCUSATE SODIUM 100 MG PO CAPS
100.0000 mg | ORAL_CAPSULE | Freq: Two times a day (BID) | ORAL | Status: DC
  Administered 2023-10-23 – 2023-10-31 (×16): 100 mg via ORAL
  Filled 2023-10-23 (×17): qty 1

## 2023-10-23 MED ORDER — SENNOSIDES-DOCUSATE SODIUM 8.6-50 MG PO TABS: 1.0000 | ORAL_TABLET | Freq: Two times a day (BID) | ORAL | Status: DC

## 2023-10-23 MED ORDER — FLUTICASONE PROPIONATE 50 MCG/ACT NA SUSP
1.0000 | Freq: Every day | NASAL | Status: DC
  Administered 2023-10-23 – 2023-10-31 (×8): 1 via NASAL
  Filled 2023-10-23: qty 16

## 2023-10-23 MED ORDER — INSULIN GLARGINE-YFGN 100 UNIT/ML ~~LOC~~ SOLN
43.0000 [IU] | Freq: Every day | SUBCUTANEOUS | Status: DC
  Administered 2023-10-23 – 2023-10-30 (×8): 43 [IU] via SUBCUTANEOUS
  Filled 2023-10-23 (×9): qty 0.43

## 2023-10-23 MED ORDER — LORATADINE 10 MG PO TABS
10.0000 mg | ORAL_TABLET | Freq: Every day | ORAL | Status: DC
  Administered 2023-10-23 – 2023-10-31 (×9): 10 mg via ORAL
  Filled 2023-10-23 (×9): qty 1

## 2023-10-23 NOTE — Consult Note (Signed)
 Neuropsychological Consultation Comprehensive Inpatient Rehab   Patient:   [name]   DOB:   1966-02-15  MR Number:  [id number]  Location:  [facility] [address] CENTER A [address] [place] [address] Dept: [phone] Loc: [phone]           Date of Service:   10/23/2023  Start Time:   10 AM End Time:   11 AM  Provider/Observer:  Chapman Commodore, Psy.D.       Clinical Neuropsychologist       Billing Code/Service: [phone]  Reason for Service:    [name] is a 58 year old male referred for neuropsychological consultation during his ongoing admission to the comprehensive inpatient rehabilitation unit due to need to monitor mood state with the patient having a prior history of bipolar type II disorder with generalized anxiety disorder, attentional deficits and being maintained on lithium .  Patient has a past medical history including type 2 diabetes, CIDP, Charcot Marie Tooth and feet, bipolar disorder, chronic left foot ulcer with osteomyelitis status post I&D and multiple prior procedures, IV antibiotics and [facility]baric treatment [facility].  Patient has had progressive breakdown ulceration to the bone and pain in left knee.  Patient seen by Dr. 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  After therapy evaluations were completed the patient was admitted to CIR due to functional decline status post recent BKA.  During today's clinical visit the patient was awake and alert with good cognition.  He was somewhat impulsive but otherwise appropriate and engaged.  Patient was well aware of his medical history and complications that he has been dealing with and was a generally good historian regarding his medical status.  Patient is highly motivated to continue with therapeutic efforts in preparation for prosthetic acquisition.  Patient  has been limited for roughly 6 years regarding his foot from doing the types of physical activity and physical engagement he would like to.  Patient has been a very active person physically prior to his degenerating physical status.  Patient reports that his mood is generally stable and he is continuing on his home medications.  Patient is being followed by psychiatry on an outpatient basis.  Psychotropic medications include Adderall, Klonopin , Effexor , lithium , and gabapentin .  Patient also has trazodone  nightly as needed.  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 Demetria Finch), IV antibiotics and hyperbaric tx ([facility]). He has had progressive breakdown ulceration to the bone and pain left knee. He was evaluated by Dr. Julio Ohm who recommended amputation. He was admitted to [facility] on 10/16/23 for L-BKA and has been participating in PT/OT.  He lives alone and was independent PTA with use of scooter due to NWB LLE. He currently requires min to CGA and CIR recommended due to functional decline.    Pt reports has been on gabapentin  for "years" and just not enough right now- taking Oxy 2-3x/day, and usually taking 10 mg.  Been having low grade temps- was 102.8 the 'other' day- but Is concerned because having more of a cough.  Pain 7-8/10 at rest since block has worn off- but spikes to "  10+/10" at time with electrical lightening that goes through his L residual limb.    Says his mood is MUCH better since they added Lithium - feels like it's made the most difference than anything in his whole life- "wished he had it 30-40 years ago".   Medical History:   Past Medical History:  Diagnosis Date   ADHD (attention deficit hyperactivity disorder)    Anxiety and depression    Candida  esophagitis (HCC)    in setting of DKA/hyperglycemia   CIDP (chronic inflammatory demyelinating polyneuropathy) (HCC)    Depression    Diabetes type 2, controlled (HCC)    Diverticulosis    Essential hypertension 06/21/2014   Hyperlipidemia    Kidney stones    Pancreatitis    Stomach ulcer    Vitamin D deficiency          Patient Active Problem List   Diagnosis Date Noted   Bipolar II disorder (HCC) 10/23/2023   Malnutrition of moderate degree 10/22/2023   Osteomyelitis of left foot (HCC) 10/21/2023   S/P BKA (below knee amputation) unilateral, left (HCC) 10/16/2023   Thrombophlebitis arm 08/03/2023   Hypokalemia 07/30/2023   Subacute osteomyelitis, left ankle and foot (HCC) 07/29/2023   Foot ulcer with fat layer exposed, left (HCC) 11/15/2019   Displaced fracture of shaft of left clavicle, initial encounter for closed fracture 03/15/2019   Dupuytren's contracture 07/29/2018   PICC (peripherally inserted central catheter) in place 05/24/2018   Neuropathy 05/12/2018   Osteomyelitis of ankle or foot, acute, left (HCC) 05/05/2018   Diabetic ulcer of left midfoot associated with type 2 diabetes mellitus, limited to breakdown of skin (HCC) 12/18/2017   Chronic pain syndrome 11/03/2017   Gastroesophageal reflux disease with esophagitis without hemorrhage 09/17/2017   Esophageal dysphagia    DKA, type 2 (HCC) 07/15/2017   ADD (attention deficit disorder) 07/15/2017   DKA (diabetic ketoacidosis) (HCC) 07/15/2017   Vitamin B12 deficiency (non anemic) 08/13/2016   Adhesive capsulitis of left shoulder 01/18/2016   Trigger ring finger of right hand 10/11/2015   Primary osteoarthritis of first carpometacarpal joint of left hand 06/14/2015   Radial styloid tenosynovitis 06/14/2015   Depression 10/05/2014   Noncompliance with diabetes treatment 10/05/2014   Vitamin D deficiency 09/14/2014   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   Diabetic peripheral neuropathy associated with  type 2 diabetes mellitus (HCC) 06/21/2014   Essential hypertension 06/21/2014   Erectile dysfunction associated with type 2 diabetes mellitus (HCC) 06/21/2014   Long-term current use of insulin  for diabetes mellitus (HCC) 06/21/2014   Adhesive capsulitis of right shoulder 03/24/2011   Retinal detachment 12/06/2010   Rotator cuff syndrome 12/06/2010   History of pancreatitis 10/22/2010   Hypertriglyceridemia 10/22/2010   Diabetes mellitus type II, uncontrolled 10/22/2010    Behavioral Observation/Mental Status:   [name]  presents as a 58 y.o.-year-old Right handed Caucasian Male who appeared his stated age. his dress was Appropriate and he was Well Groomed and his manners were Appropriate to the situation.  his participation was indicative of Appropriate and Attentive behaviors.  There were physical disabilities noted.  he displayed an appropriate level of cooperation and motivation.    Interactions:    Active Appropriate  Attention:   abnormal and attention span appeared shorter than expected for age  Memory:   within normal limits; recent and remote memory intact  Visuo-spatial:   not examined  Speech (Volume):  normal  Speech:   normal; normal  Thought Process:  Coherent, Relevant, and  Tangential  Coherent and Linear  Though Content:  WNL; not suicidal and not homicidal  Orientation:   person, place, time/date, and situation  Judgment:   Good  Planning:   Fair  Affect:    Appropriate  Mood:    Dysphoric  Insight:   Good  Intelligence:   high  Psychiatric History:  Patient with significant past psychiatric history including diagnosis of bipolar disorder type II with significant severe depressive events, diagnosis of attention deficit disorder although I suspect this is more of a result of his bipolar disorder.  Family Med/Psych History:  Family History  Problem Relation Age of Onset   Stroke Father    Breast cancer Mother    Colon cancer Neg Hx      Impression/DX:   [name] is a 58 year old male referred for neuropsychological consultation during his ongoing admission to the comprehensive inpatient rehabilitation unit due to need to monitor mood state with the patient having a prior history of bipolar type II disorder with generalized anxiety disorder, attentional deficits and being maintained on lithium .  Patient has a past medical history including type 2 diabetes, CIDP, Charcot Marie Tooth and feet, bipolar disorder, chronic left foot ulcer with osteomyelitis status post I&D and multiple prior procedures, IV antibiotics and [facility] treatment [facility].  Patient has had progressive breakdown ulceration to the bone and pain in left knee.  Patient seen by Dr. 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  After therapy evaluations were completed the patient was admitted to CIR due to functional decline status post recent BKA.  During today's clinical visit the patient was awake and alert with good cognition.  He was somewhat impulsive but otherwise appropriate and engaged.  Patient was well aware of his medical history and complications that he has been dealing with and was a generally good historian regarding his medical status.  Patient is highly motivated to continue with therapeutic efforts in preparation for prosthetic acquisition.  Patient has been limited for roughly 6 years regarding his foot from doing the types of physical activity and physical engagement he would like to.  Patient has been a very active person physically prior to his degenerating physical status.  Patient reports that his mood is generally stable and he is continuing on his home medications.  Patient is being followed by psychiatry on an outpatient basis.  Psychotropic medications include Adderall, Klonopin , Effexor , lithium , and gabapentin .  Patient also has trazodone  nightly as  needed.  Disposition/Plan:  Today we worked on coping and adjustment issues and patient reports rather stable mood/affect.  Diagnosis:    Bipolar II disorder in partial remission         Electronically Signed   _______________________ Chapman Commodore, Psy.D. Clinical Neuropsychologist

## 2023-10-23 NOTE — Progress Notes (Signed)
 Physical Therapy Session Note  Patient Details  Name: [name] MRN: [id number] Date of Birth: 1965-07-06  Today's Date: 10/23/2023 PT Individual Time: 4098-1191 PT Individual Time Calculation (min): 70 min   Short Term Goals: Week 1:  PT Short Term Goal 1 (Week 1): STGs = LTGs  Skilled Therapeutic Interventions/Progress Updates:     Pt received seated in [facility] and agrees to therapy. Reports pain in LLE. PT provides rest breaks as needed to manage pain, and RN provides pain meds at beginning of session. Pt self propels WC x125'. Pt participates in standing activity to challenge balance as well as providing coordination challenge for upper extremities. Pt tasked with completing peg board pattern recreation with alternating upper extremities. PT provides cues for posture and correct completion, but pt does not require any physical assistance for activity. Following rest break, pt stands with CGA and ambulates x175' with RW and CGA, with cues for upright posture to improve balance and depressing and downwardly rotating scapulae for improved body mechanics. Following rest break, pt ambulates additional 175' with same assistance. Pt self propels WC back to room. Left seated with all needs within reach.    Therapy Documentation Precautions:  Precautions Precautions: Fall Precaution/Restrictions Comments: LLE wound vac Restrictions Weight Bearing Restrictions Per Provider Order: Yes LLE Weight Bearing Per Provider Order: Non weight bearing   Therapy/Group: Individual Therapy  Neva Barban, PT, DPT 10/23/2023, 3:53 PM

## 2023-10-23 NOTE — IPOC Note (Signed)
 Overall Plan of Care [facility]) Patient Details Name: [name] MRN: [id number] DOB: 02-04-1966  Admitting Diagnosis: S/P BKA (below knee amputation) unilateral, left [facility])  Hospital Problems: Principal Problem:   S/P BKA (below knee amputation) unilateral, left (HCC) Active Problems:   Osteomyelitis of left foot (HCC)   Malnutrition of moderate degree   Bipolar II disorder (HCC)     Functional Problem List: Nursing Bowel, Edema, Endurance, Medication Management, Pain, Safety, Skin Integrity  PT Balance, Endurance, Motor, Pain, Safety, Sensory  OT Balance, Safety, Edema, Sensory, Endurance, Skin Integrity, Motor, Pain  SLP    TR         Basic ADL's: OT Bathing, Dressing, Toileting     Advanced  ADL's: OT Simple Meal Preparation, Light Housekeeping     Transfers: PT Bed Mobility, Bed to Chair, Set designer, Oncologist: PT Psychologist, prison and probation services, Ambulation, Stairs     Additional Impairments: OT None  SLP        TR      Anticipated Outcomes Item Anticipated Outcome  Self Feeding no goal  Swallowing      Basic self-care  (S)- mod I  Toileting  mod I   Bathroom Transfers mod i  Bowel/Bladder  Maintain continence, avoid constipation  Transfers  mod(I)  Locomotion  supervisoin  Communication     Cognition     Pain  <3 on a 0-10 pain scale  Safety/Judgment  Supervision and no falls   Therapy Plan: PT Intensity: Minimum of 1-2 x/day ,45 to 90 minutes PT Frequency: 5 out of 7 days PT Duration Estimated Length of Stay: 7 days OT Intensity: Minimum of 1-2 x/day, 45 to 90 minutes OT Frequency: 5 out of 7 days OT Duration/Estimated Length of Stay: 7 days     Team Interventions: Nursing Interventions Patient/Family Education, Bowel Management, Disease Management/Prevention, Pain Management, Medication Management, Skin Care/Wound Management, Discharge Planning, Psychosocial Support  PT interventions Ambulation/gait training,  Community reintegration, Neuromuscular re-education, Psychosocial support, DME/adaptive equipment instruction, Stair training, UE/LE Strength taining/ROM, Wheelchair propulsion/positioning, Warden/ranger, Discharge planning, Functional electrical stimulation, Therapeutic Activities, UE/LE Coordination activities, Pain management, Cognitive remediation/compensation, Patient/family education, Visual/perceptual remediation/compensation, Therapeutic Exercise, Functional mobility training, Splinting/orthotics, Skin care/wound management  OT Interventions Balance/vestibular training, Discharge planning, Pain management, Self Care/advanced ADL retraining, Therapeutic Activities, UE/LE Coordination activities, Therapeutic Exercise, Skin care/wound managment, Patient/family education, Functional mobility training, Community reintegration, Fish farm manager, Psychosocial support, Splinting/orthotics, UE/LE Strength taining/ROM, Wheelchair propulsion/positioning  SLP Interventions    TR Interventions    SW/CM Interventions Discharge Planning, Psychosocial Support, Patient/Family Education, Disease Management/Prevention   Barriers to Discharge MD  Medical stability, Home enviroment access/loayout, Wound care, Lack of/limited family support, and Weight bearing restrictions  Nursing Decreased caregiver support, Wound Care, Lack of/limited family support, Weight bearing restrictions Lives alone, sister to assist at discharge. 1 level, 2 steps. Tub/shower unit.  PT      OT Decreased caregiver support, Home environment access/layout Waiting for ramp to be installed, will have supervision for one week  SLP      SW Decreased caregiver support, Home environment access/layout, Weight bearing restrictions 1 level 2 ste off concrete pad   Team Discharge Planning: Destination: PT-Home ,OT- Home , SLP-  Projected Follow-up: PT-Outpatient PT, OT-  Home health OT, SLP-  Projected Equipment  Needs: PT-To be determined, OT- To be determined, SLP-  Equipment Details: PT- , OT-  Patient/family involved in discharge planning: PT- Patient,  OT-Patient, SLP-   MD  ELOS: 5-7 days Medical Rehab Prognosis:  Excellent Assessment: The patient has been admitted for CIR therapies with the diagnosis of L BKA. The team will be addressing functional mobility, strength, stamina, balance, safety, adaptive techniques and equipment, self-care, bowel and bladder mgt, patient and caregiver education, . Goals have been set at [facility] I PT/OT. Anticipated discharge destination is home.       See Team Conference Notes for weekly updates to the plan of care

## 2023-10-23 NOTE — Progress Notes (Signed)
 Occupational Therapy Session Note  Patient Details  Name: Erik FRAPPIER MRN: [id number] Date of Birth: 1966-02-02  Session 1  Today's Date: 10/23/2023 OT Individual Time: 4098-1191 OT Individual Time Calculation (min): 42 min   Session 2  Today's Date: 10/23/2023 OT Individual Time: 4782-9562 OT Individual Time Calculation (min): 71 min    Short Term Goals: Week 1:  OT Short Term Goal 1 (Week 1): STG= LTG d/t ELOS  Skilled Therapeutic Interventions/Progress Updates:    Session 1 Pt received supine with no c/o pain, agreeable to OT session. He came to EOB with (S). He reported a bad night of sleep d/t multiple nightmares in re to LLE trauma, etc. Pt scheduled to see neuropsych today. He completed stand pivot to the w/c with the RW with CGA. He required min cueing to don limb guard and manage wound vac. He completed UB bathing with (S) seated. OT provided hair washing via tray with pt facing away from sink. He then completed functional mobility with the RW into the bathroom for toileting. He completed toileting tasks with close (S) seated ,lateral leans to complete posterior hygiene from successful BM void. He returned to the w/c following with CGA. He completed 100 ft of w/c propulsion to the therapy gym for increased UE endurance/strengthening. He completed brief UE strengthening circuit with a 5lb dowel- targeting anterior deltoid, pec major and triceps, 3x10 repetitions. He returned to his room and was left sitting up with all needs met.   Session 2 Pt received sitting in the w/c with 6/10 pain in his residual limb, reporting he is premedicated and no request for pain medication or further intervention. He completed 100 ft of w/c propulsion to the therapy gym. Stand pivot to the mat with the RW with CGA. He transitioned to prone on the mat with (S). He worked on static prone positioning to benefit hip and residual limb extension. He then completed alternating prone push ups with full  body extensions ("supermans") to challenge posterior chain strengthening and UE strengthening needed for ADL transfers. H He then transferred back to the w/c and propelled to the therapy gym. He worked on static standing with RUE only support on the RW by completing several functional reaching programs on the BITS- for carryover to LB dressing in standing/toileting. Pt completed the BUE ergometer to challenge BUE strength and endurance needed to complete ADLs and IADLs with the highest level of independence. Pt completed 6 min, break, and then another 6 min with cueing for pacing and technique. Level 6 resistance and RPM >40. He then completed w/c propulsion to the ADL apt. He worked on standing level functional reaching into the cabinets to address IADL reintegration/meal prep. Discussed energy conservation/fall risk reduction strategies at length. He returned to his room. He transferred back to supine in bed and was left with all needs met.    Therapy Documentation Precautions:  Precautions Precautions: Fall Precaution/Restrictions Comments: LLE wound vac Restrictions Weight Bearing Restrictions Per Provider Order: Yes LLE Weight Bearing Per Provider Order: Non weight bearing  Therapy/Group: Individual Therapy  Una Ganser 10/23/2023, 6:22 AM

## 2023-10-23 NOTE — Progress Notes (Signed)
 Patient ID: [name], male   DOB: 07-06-1965, 58 y.o.   MRN: [id number]  Have ordered wheelchair and tub bench via [employer] pt feels he has a rolling walker at home.

## 2023-10-23 NOTE — Progress Notes (Signed)
 PROGRESS NOTE   Subjective/Complaints: Nutrition evaluated yesterday, recommended protein supplementation with Glucerna, Prosource, and Juven.  Along with multivitamin daily.  Blood sugar elevated 198 this a.m., but trending in the right direction.  Vitals are stable.  1 use of as needed oxycodone  yesterday.  Otherwise, well-controlled on current regimen.  Last bowel movement 5-21, medium.  Asking patient, he states he actually had 4 bowel movements last night and into this morning, starting to get loose.  Had of 1 episode of phantom pain yesterday.  Overall, not yet bothersome.  ROS: Denies fevers, chills, N/V, abdominal pain, constipation, diarrhea, SOB, cough, chest pain, new weakness or paraesthesias.   + Pain over surgical site + phantom pain + loose stools  Objective:   No results found. Recent Labs    10/21/23 0504 10/22/23 0556  WBC 5.8 4.8  HGB 10.8* 10.8*  HCT 31.7* 31.8*  PLT 219 248   Recent Labs    10/21/23 0504 10/22/23 0556  NA 139 138  K 4.5 4.0  CL 100 102  CO2 30 29  GLUCOSE 152* 200*  BUN 18 21*  CREATININE 1.25* 1.18  CALCIUM  9.6 9.3    Intake/Output Summary (Last 24 hours) at 10/23/2023 0925 Last data filed at 10/23/2023 1914 Gross per 24 hour  Intake 720 ml  Output 2900 ml  Net -2180 ml        Physical Exam: Vital Signs Blood pressure 131/77, pulse (!) 59, temperature 97.6 F (36.4 C), temperature source Oral, resp. rate 18, height 6\' 4"  (1.93 m), weight 90.7 kg, SpO2 97%. Constitutional: No apparent distress. Appropriate appearance for age.  Sitting upright in chair. HENT: No JVD. Neck Supple. Trachea midline. Atraumatic, normocephalic. Eyes: PERRLA. EOMI. Visual fields grossly intact.  Cardiovascular: Regular rate and rhythm, no murmurs/rub/gallops. No Edema. Peripheral pulses 2+  Respiratory: CTAB. No rales, rhonchi, or wheezing. On RA.  Abdomen: + bowel sounds,  normoactive. No distention or tenderness.  Skin: C/D/I. No apparent lesions.  Left BKA wound VAC in place, with no current output. MSK:      Positive left BKA, minimal edema, well-formed. + Limb guard in place.      Strength:                RUE: 5/5 SA, 5/5 EF, 5/5 EE, 5/5 WE, 5/5 FF, 5/5 FA                 LUE: 5/5 SA, 5/5 EF, 5/5 EE, 5/5 WE, 5/5 FF, 5/5 FA                 RLE: 5-/5 HF, 5/5 KE, 5/5 DF, 5/5 EHL, 5/5 PF                 LLE:  5-/5 HF, 5/5 KE Neurologic exam:  Cognition: AAO to person, place, time and event.  Language: Fluent, No substitutions or neoglisms. No dysarthria.  Memory: No apparent deficits. Insight: Good insight into current condition.  Mood: Moderate anxiety, pleasant mood and affect.  Restless. Sensation: Equal and intact in BL UE and Les that a stocking glove pattern from the right mid calf into the toes  Assessment/Plan: 1. Functional deficits which require 3+ hours per day of interdisciplinary therapy in a comprehensive inpatient rehab setting. Physiatrist is providing close team supervision and 24 hour management of active medical problems listed below. Physiatrist and rehab team continue to assess barriers to discharge/monitor patient progress toward functional and medical goals  Care Tool:  Bathing    Body parts bathed by patient: Right arm, Left arm, Abdomen, Front perineal area, Chest, Buttocks, Right upper leg, Left upper leg, Right lower leg, Face, Left lower leg         Bathing assist Assist Level: Minimal Assistance - Patient > 75%     Upper Body Dressing/Undressing Upper body dressing   What is the patient wearing?: Pull over shirt    Upper body assist Assist Level: Supervision/Verbal cueing    Lower Body Dressing/Undressing Lower body dressing      What is the patient wearing?: Pants     Lower body assist Assist for lower body dressing: Minimal Assistance - Patient > 75%     Toileting Toileting    Toileting  assist Assist for toileting: Minimal Assistance - Patient > 75%     Transfers Chair/bed transfer  Transfers assist     Chair/bed transfer assist level: Contact Guard/Touching assist     Locomotion Ambulation   Ambulation assist      Assist level: Minimal Assistance - Patient > 75% Assistive device: Walker-rolling Max distance: 100'   Walk 10 feet activity   Assist     Assist level: Minimal Assistance - Patient > 75% Assistive device: No Device   Walk 50 feet activity   Assist    Assist level: Minimal Assistance - Patient > 75% Assistive device: No Device    Walk 150 feet activity   Assist Walk 150 feet activity did not occur: Safety/medical concerns         Walk 10 feet on uneven surface  activity   Assist     Assist level: Minimal Assistance - Patient > 75% Assistive device: Walker-rolling   Wheelchair     Assist Is the patient using a wheelchair?: Yes      Wheelchair assist level: Supervision/Verbal cueing Max wheelchair distance: 300'    Wheelchair 50 feet with 2 turns activity    Assist        Assist Level: Supervision/Verbal cueing   Wheelchair 150 feet activity     Assist      Assist Level: Supervision/Verbal cueing   Blood pressure 131/77, pulse (!) 59, temperature 97.6 F (36.4 C), temperature source Oral, resp. rate 18, height 6\' 4"  (1.93 m), weight 90.7 kg, SpO2 97%.  1. 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             Stable to continue CIR  - 5-22: Order for additional shrinker sock and MP shield today  2.  Antithrombotics: -DVT/anticoagulation:  Pharmaceutical: Lovenox  added             -antiplatelet therapy:   3. Pain Management: Continue Oxycodone  prn. On home dose gabapentin  but will increase gabapentin  to 1200 mg TID and increase Oxy to 10-15  mg q4 hours prn.   5-21: Scheduled Tylenol  1000 mg 3 times daily, oxycodone  5 mg 3 times  daily.  Reduce as needed oxycodone  to 5 to 10 mg every 4 hours as needed.  Added Robaxin 750 mg as needed.  5-22: Pain better controlled on current regimen.  5-23: Discussed phantom pains, patient states they are intermittent and does not want additional medication treatment at this time.  4. Mood/Behavior/Sleep: LCSW to follow for evaluation and support.              -antipsychotic agents: Caplyta  and Lithium .             --trazodone  prn for chronic insomnia. --Using nightly  5. Neuropsych/cognition: This patient is capable of making decisions on his own behalf. 6. Skin/Wound Care: Continue wound VAC--can change to prevena if present in room.              --continue Vitamin C  and Zinc . Prostat added for wound healing.   5-21: Wound VAC 1 week per Dr. Lyndon Santiago (5/27); minimal output at this point  7. Fluids/Electrolytes/Nutrition:  Monitor I/O. Check CMET in am.  5-21: AM labs with mild increase in creatinine; discussed p.o. fluid intakes with patient, encouraging 8 cups of fluid per day, will retest BMP 1 to 2 days  5-22: BUN, creatinine improved.  Monitor.  5-23: Appreciate dietary recommendations with supplementation with Glucerna, Juven, and Prosource for protein and wound healing.  Will monitor weights weekly and assess p.o. intakes as needed.  8.  T2DM: Hgb A1c-7.7  (improved from 10.9 in Feb '25)             --monitor BS ac/hs and use SSI for elevated BS.             --continue Insulin  glargine 40 units/HS and novolog  4 units tid ac.    5-21: Blood sugars generally well-controlled on current regimen.   5-23: Sugars mildly elevated this a.m., had a low in the 90s yesterday so we will trend today and if ongoing greater than 150 will adjust glargine--increased to 43 units at bedtime  Monitor for 24 hours. Recent Labs    10/22/23 2038 10/23/23 0620 10/23/23 1124  GLUCAP 198* 196* 123*     9.  HTN: Monitor BP tid. Not on altace  at this time.   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 PUD/Candida esophagitis hx: Recent episode of gastroenteritis. Dr. Willy Harvest 12. Bipolar d/o/GAD w/recent severe depressive episode: On Effexor , Adderall, Clonazepam , Gabapentin  and Caplyta .              --Lithium  added May 15th by Dr. Lan[facility]. 13. ADHD: Adderall 30 mg bid (increased 10/05/23  for activation) 14. B12 deficiency?:Will check level in am.  377, normal. 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 Constipation- LBM 6 days ago- will increase bowel meds  Last bowel movement 5-21  5-23: Patient endorsing multiple bowel movements from formed to loose this a.m.  DC scheduled MiraLAX , add Colace 100 mg twice daily.  17. Cough with low grade fever- will get CXR 1 view-resolved   - 5-23: Patient complains of ongoing cough, minimal production, no fevers or chills.  Repeat chest x-ray negative.  Added daily Claritin 10 mg and Flonase 1 spray bilateral nares daily.   LOS: 3 days A FACE TO FACE  EVALUATION WAS PERFORMED  Bea Lime 10/23/2023, 9:25 AM

## 2023-10-24 LAB — GLUCOSE, CAPILLARY
Glucose-Capillary: 86 mg/dL (ref 70–99)
Glucose-Capillary: 86 mg/dL (ref 70–99)
Glucose-Capillary: 155 mg/dL — ABNORMAL HIGH (ref 70–99)
Glucose-Capillary: 231 mg/dL — ABNORMAL HIGH (ref 70–99)

## 2023-10-24 NOTE — Progress Notes (Signed)
 PROGRESS NOTE   Subjective/Complaints: No new complaints this morning Asks what the purpose of the [facility] is [name] has a day off from therapy today [name]'s chart reviewed- No issues reported overnight  ROS: Denies fevers, chills, N/V, abdominal pain, constipation, diarrhea, SOB, cough, chest pain, new weakness or paraesthesias.   + Pain over surgical site + phantom pain + loose stools  Objective:   DG CHEST PORT 1 VIEW Result Date: 10/23/2023 CLINICAL DATA:  Cough EXAM: PORTABLE CHEST 1 VIEW COMPARISON:  09/22/2023 FINDINGS: Single frontal view of the chest demonstrates an unremarkable cardiac silhouette. Chronic linear scarring at the left lung base. No acute airspace disease, effusion, or pneumothorax. Prior ORIF left clavicular fracture. No acute bony abnormalities. IMPRESSION: 1. No acute intrathoracic process. Electronically Signed   By: Bobbye Burrow M.D.   On: 10/23/2023 15:20   Recent Labs    10/22/23 0556  WBC 4.8  HGB 10.8*  HCT 31.8*  PLT 248   Recent Labs    10/22/23 0556  NA 138  K 4.0  CL 102  CO2 29  GLUCOSE 200*  BUN 21*  CREATININE 1.18  CALCIUM  9.3    Intake/Output Summary (Last 24 hours) at 10/24/2023 1509 Last data filed at 10/24/2023 1244 Gross per 24 hour  Intake 1560 ml  Output 4600 ml  Net -3040 ml        Physical Exam: Vital Signs Blood pressure 126/73, pulse 64, temperature 97.6 F (36.4 C), temperature source Oral, resp. rate 18, height 6\' 4"  (1.93 m), weight 90.7 kg, SpO2 100%. Constitutional: No apparent distress. Appropriate appearance for age.  Sitting upright in chair. HENT: No JVD. Neck Supple. Trachea midline. Atraumatic, normocephalic. Eyes: PERRLA. EOMI. Visual fields grossly intact.  Cardiovascular: Regular rate and rhythm, no murmurs/rub/gallops. No Edema. Peripheral pulses 2+  Respiratory: CTAB. No rales, rhonchi, or wheezing. On RA.  Abdomen: + bowel sounds,  normoactive. No distention or tenderness.  Skin: C/D/I. No apparent lesions.  Left BKA wound VAC in place, with no current output. MSK:      Positive left BKA, minimal edema, well-formed. + Limb guard in place.      Strength:                RUE: 5/5 SA, 5/5 EF, 5/5 EE, 5/5 WE, 5/5 FF, 5/5 FA                 LUE: 5/5 SA, 5/5 EF, 5/5 EE, 5/5 WE, 5/5 FF, 5/5 FA                 RLE: 5-/5 HF, 5/5 KE, 5/5 DF, 5/5 EHL, 5/5 PF                 LLE:  5-/5 HF, 5/5 KE, stable 5/24 Neurologic exam:  Cognition: AAO to person, place, time and event.  Language: Fluent, No substitutions or neoglisms. No dysarthria.  Memory: No apparent deficits. Insight: Good insight into current condition.  Mood: Moderate anxiety, pleasant mood and affect.  Restless. Sensation: Equal and intact in BL UE and Les that a stocking glove pattern from the right mid calf into the toes  Assessment/Plan: 1. Functional deficits which require 3+ hours per day of interdisciplinary therapy in a comprehensive inpatient rehab setting. Physiatrist is providing close team supervision and 24 hour management of active medical problems listed below. Physiatrist and rehab team continue to assess barriers to discharge/monitor [name] progress toward functional and medical goals  Care Tool:  Bathing    Body parts bathed by [name]: Right arm, Left arm, Abdomen, Front perineal area, Chest, Buttocks, Right upper leg, Left upper leg, Right lower leg, Face, Left lower leg         Bathing assist Assist Level: Contact Guard/Touching assist     Upper Body Dressing/Undressing Upper body dressing   What is the [name] wearing?: Pull over shirt    Upper body assist Assist Level: Supervision/Verbal cueing    Lower Body Dressing/Undressing Lower body dressing      What is the [name] wearing?: Pants     Lower body assist Assist for lower body dressing: Minimal Assistance - [name] > 75%     Toileting Toileting     Toileting assist Assist for toileting: Contact Guard/Touching assist     Transfers Chair/bed transfer  Transfers assist     Chair/bed transfer assist level: Contact Guard/Touching assist     Locomotion Ambulation   Ambulation assist      Assist level: Contact Guard/Touching assist Assistive device: Walker-rolling Max distance: 175'   Walk 10 feet activity   Assist     Assist level: Contact Guard/Touching assist Assistive device: Walker-rolling   Walk 50 feet activity   Assist    Assist level: Contact Guard/Touching assist Assistive device: Walker-rolling    Walk 150 feet activity   Assist Walk 150 feet activity did not occur: Safety/medical concerns         Walk 10 feet on uneven surface  activity   Assist     Assist level: Minimal Assistance - [name] > 75% Assistive device: Walker-rolling   Wheelchair     Assist Is the [name] using a wheelchair?: Yes      Wheelchair assist level: Supervision/Verbal cueing Max wheelchair distance: 300'    Wheelchair 50 feet with 2 turns activity    Assist        Assist Level: Supervision/Verbal cueing   Wheelchair 150 feet activity     Assist      Assist Level: Supervision/Verbal cueing   Blood pressure 126/73, pulse 64, temperature 97.6 F (36.4 C), temperature source Oral, resp. rate 18, height 6\' 4"  (1.93 m), weight 90.7 kg, SpO2 100%.  1. 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             Stable to continue CIR  - 5-22: Order for additional shrinker sock and MP shield today  2.  Antithrombotics: -DVT/anticoagulation:  Pharmaceutical: Lovenox  added             -antiplatelet therapy:   3. Pain Management: Continue Oxycodone  prn. On home dose gabapentin  but will increase gabapentin  to 1200 mg TID and increase Oxy to 10-15  mg q4 hours prn.   5-21: Scheduled Tylenol  1000 mg 3 times daily, oxycodone  5 mg 3 times daily.  Reduce as needed oxycodone  to 5 to 10 mg every 4 hours as needed.  Added  Robaxin  750 mg as needed.  5-22: Pain better controlled on current regimen.  5-23: Discussed phantom pains, [name] states they are intermittent and does not want additional medication treatment at this time.  4. Mood/Behavior/Sleep: LCSW to follow for evaluation and support.              -antipsychotic agents: Caplyta  and Lithium .             --trazodone  prn for chronic insomnia. --Using nightly  5. Neuropsych/cognition: This [name] is capable of making decisions on his own behalf. 6. Skin/Wound Care: Continue wound VAC--can change to prevena if present in room.              --continue Vitamin C  and Zinc . Prostat added for wound healing.   5-21: Wound VAC 1 week per Dr. Lyndon Santiago (5/27); minimal output at this point  7. Fluids/Electrolytes/Nutrition:  Monitor I/O. Check CMET in am.  5-21: AM labs with mild increase in creatinine; discussed p.o. fluid intakes with [name], encouraging 8 cups of fluid per day, will retest BMP 1 to 2 days  5-22: BUN, creatinine improved.  Monitor.  5-23: Appreciate dietary recommendations with supplementation with Glucerna, Juven, and Prosource for protein and wound healing.  Will monitor weights weekly and assess p.o. intakes as needed.  8.  T2DM: Hgb A1c-7.7  (improved from 10.9 in Feb '25)             --monitor BS ac/hs and use SSI for elevated BS.             --continue Insulin  glargine 40 units/HS and novolog  4 units tid ac.    5-21: Blood sugars generally well-controlled on current regimen.   5-23: Sugars mildly elevated this a.m., had a low in the 90s yesterday so we will trend today and if ongoing greater than 150 will adjust glargine--increased to 43 units at bedtime  Monitor for 24 hours. Recent Labs    10/23/23 2054 10/24/23 0622 10/24/23 1141  GLUCAP 200* 86 86     9. HTN:  Monitor BP tid. Not on altace  at this time.   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 PUD/Candida esophagitis hx: Recent episode of gastroenteritis. Dr. Willy Harvest  12. Bipolar d/o/GAD w/recent severe depressive episode: On Effexor , Adderall, Clonazepam , Gabapentin  and Caplyta .              --Lithium  added May 15th by Dr. Lan[facility]. Continue  13. ADHD: continue Adderall 30 mg bid (increased 10/05/23  for activation)  14. B12 deficiency?:Will check level in am.  377, normal.  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 Constipation- LBM 6 days ago- will increase bowel meds  Last bowel movement 5-21  5-23: [name] endorsing multiple bowel movements from formed to loose this a.m.  DC scheduled MiraLAX  continue Colace 100 mg twice daily.  17. Cough with low grade fever- will get CXR 1 view-resolved  [name] complains of ongoing cough, minimal production, no fevers or chills.  Repeat chest x-ray negative.  Continue daily Claritin  10 mg and Flonase  1 spray bilateral nares daily.   LOS: 4 days A FACE TO FACE EVALUATION WAS PERFORMED  [name] 10/24/2023, 3:09 PM

## 2023-10-24 NOTE — Plan of Care (Signed)
  Problem: Consults Goal: RH LIMB LOSS PATIENT EDUCATION Description: Description: See Patient Education module for eduction specifics. Outcome: Progressing Goal: Skin Care Protocol Initiated - if Braden Score 18 or less Description: If consults are not indicated, leave blank or document N/A Outcome: Progressing Goal: Diabetes Guidelines if Diabetic/Glucose > 140 Description: If diabetic or lab glucose is > 140 mg/dl - Initiate Diabetes/Hyperglycemia Guidelines & Document Interventions  Outcome: Progressing   Problem: RH BOWEL ELIMINATION Goal: RH STG MANAGE BOWEL WITH ASSISTANCE Description: STG Manage Bowel with Supervision Assistance. Outcome: Progressing Goal: RH STG MANAGE BOWEL W/MEDICATION W/ASSISTANCE Description: STG Manage Bowel with Medication with Supervision Assistance. Outcome: Progressing   Problem: RH SKIN INTEGRITY Goal: RH STG MAINTAIN SKIN INTEGRITY WITH ASSISTANCE Description: STG Maintain Skin Integrity With Supervision Assistance. Outcome: Progressing Goal: RH STG ABLE TO PERFORM INCISION/WOUND CARE W/ASSISTANCE Description: STG Able To Perform Incision/Wound Care With Supervision Assistance. Outcome: Progressing   Problem: RH SAFETY Goal: RH STG ADHERE TO SAFETY PRECAUTIONS W/ASSISTANCE/DEVICE Description: STG Adhere to Safety Precautions With Supervision Assistance/Device. Outcome: Progressing Goal: RH STG DECREASED RISK OF FALL WITH ASSISTANCE Description: STG Decreased Risk of Fall With Supervision Assistance. Outcome: Progressing   Problem: RH PAIN MANAGEMENT Goal: RH STG PAIN MANAGED AT OR BELOW PT'S PAIN GOAL Description: < 3 on a 0-10 pain scale. Outcome: Progressing   Problem: RH KNOWLEDGE DEFICIT LIMB LOSS Goal: RH STG INCREASE KNOWLEDGE OF SELF CARE AFTER LIMB LOSS Description: Patient will demonstrate knowledge of incisional care, residual limb care, and pain management with educational materials and handouts provided by staff. Outcome:  Progressing   Problem: Education: Goal: Ability to describe self-care measures that may prevent or decrease complications (Diabetes Survival Skills Education) will improve Outcome: Progressing Goal: Individualized Educational Video(s) Outcome: Progressing   Problem: Coping: Goal: Ability to adjust to condition or change in health will improve Outcome: Progressing   Problem: Fluid Volume: Goal: Ability to maintain a balanced intake and output will improve Outcome: Progressing   Problem: Health Behavior/Discharge Planning: Goal: Ability to identify and utilize available resources and services will improve Outcome: Progressing Goal: Ability to manage health-related needs will improve Outcome: Progressing   Problem: Metabolic: Goal: Ability to maintain appropriate glucose levels will improve Outcome: Progressing   Problem: Nutritional: Goal: Maintenance of adequate nutrition will improve Outcome: Progressing Goal: Progress toward achieving an optimal weight will improve Outcome: Progressing   Problem: Skin Integrity: Goal: Risk for impaired skin integrity will decrease Outcome: Progressing   Problem: Tissue Perfusion: Goal: Adequacy of tissue perfusion will improve Outcome: Progressing

## 2023-10-24 NOTE — Plan of Care (Signed)
  Problem: Consults Goal: RH LIMB LOSS PATIENT EDUCATION Description: Description: See Patient Education module for eduction specifics. Outcome: Progressing Goal: Skin Care Protocol Initiated - if Braden Score 18 or less Description: If consults are not indicated, leave blank or document N/A Outcome: Progressing Goal: Diabetes Guidelines if Diabetic/Glucose > 140 Description: If diabetic or lab glucose is > 140 mg/dl - Initiate Diabetes/Hyperglycemia Guidelines & Document Interventions  Outcome: Progressing   Problem: RH BOWEL ELIMINATION Goal: RH STG MANAGE BOWEL WITH ASSISTANCE Description: STG Manage Bowel with Supervision Assistance. Outcome: Progressing Goal: RH STG MANAGE BOWEL W/MEDICATION W/ASSISTANCE Description: STG Manage Bowel with Medication with Supervision Assistance. Outcome: Progressing   Problem: RH SKIN INTEGRITY Goal: RH STG MAINTAIN SKIN INTEGRITY WITH ASSISTANCE Description: STG Maintain Skin Integrity With Supervision Assistance. Outcome: Progressing Goal: RH STG ABLE TO PERFORM INCISION/WOUND CARE W/ASSISTANCE Description: STG Able To Perform Incision/Wound Care With Supervision Assistance. Outcome: Progressing   Problem: RH PAIN MANAGEMENT Goal: RH STG PAIN MANAGED AT OR BELOW PT'S PAIN GOAL Description: < 3 on a 0-10 pain scale. Outcome: Progressing   Problem: RH KNOWLEDGE DEFICIT LIMB LOSS Goal: RH STG INCREASE KNOWLEDGE OF SELF CARE AFTER LIMB LOSS Description: Patient will demonstrate knowledge of incisional care, residual limb care, and pain management with educational materials and handouts provided by staff. Outcome: Progressing

## 2023-10-24 NOTE — Plan of Care (Signed)
?  Problem: RH SKIN INTEGRITY ?Goal: RH STG MAINTAIN SKIN INTEGRITY WITH ASSISTANCE ?Description: STG Maintain Skin Integrity With Supervision Assistance. ?Outcome: Progressing ?  ?

## 2023-10-25 ENCOUNTER — Inpatient Hospital Stay (HOSPITAL_COMMUNITY)

## 2023-10-25 LAB — GLUCOSE, CAPILLARY
Glucose-Capillary: 113 mg/dL — ABNORMAL HIGH (ref 70–99)
Glucose-Capillary: 142 mg/dL — ABNORMAL HIGH (ref 70–99)
Glucose-Capillary: 111 mg/dL — ABNORMAL HIGH (ref 70–99)
Glucose-Capillary: 284 mg/dL — ABNORMAL HIGH (ref 70–99)

## 2023-10-25 NOTE — Progress Notes (Signed)
 PROGRESS NOTE   Subjective/Complaints: C/o chronic left elbow pain- has been more noticeable now that he is using the walker, questions bursitis, agreeable to XR and likes idea of possible steroid injection  ROS: Denies fevers, chills, N/V, abdominal pain, constipation, diarrhea, SOB, cough, chest pain, new weakness or paraesthesias.   + Pain over surgical site + phantom pain + loose stools +left elbow pain  Objective:   No results found.  No results for input(s): "WBC", "HGB", "HCT", "PLT" in the last 72 hours.  No results for input(s): "NA", "K", "CL", "CO2", "GLUCOSE", "BUN", "CREATININE", "CALCIUM " in the last 72 hours.   Intake/Output Summary (Last 24 hours) at 10/25/2023 1252 Last data filed at 10/25/2023 1250 Gross per 24 hour  Intake 1400 ml  Output 4675 ml  Net -3275 ml        Physical Exam: Vital Signs Blood pressure 119/71, pulse (!) 56, temperature 98.1 F (36.7 C), resp. rate 18, height 6\' 4"  (1.93 m), weight 90.7 kg, SpO2 96%. Constitutional: No apparent distress. Appropriate appearance for age.  Sitting upright in chair. HENT: No JVD. Neck Supple. Trachea midline. Atraumatic, normocephalic. Eyes: PERRLA. EOMI. Visual fields grossly intact.  Cardiovascular: Bradycardic. Peripheral pulses 2+  Respiratory: CTAB. No rales, rhonchi, or wheezing. On RA.  Abdomen: + bowel sounds, normoactive. No distention or tenderness.  Skin: C/D/I. No apparent lesions.  Left BKA wound VAC in place, with no current output. MSK:      Positive left BKA, minimal edema, well-formed. + Limb guard in place.      Strength:                RUE: 5/5 SA, 5/5 EF, 5/5 EE, 5/5 WE, 5/5 FF, 5/5 FA                 LUE: 5/5 SA, 5/5 EF, 5/5 EE, 5/5 WE, 5/5 FF, 5/5 FA                 RLE: 5-/5 HF, 5/5 KE, 5/5 DF, 5/5 EHL, 5/5 PF                 LLE:  5-/5 HF, 5/5 KE, stable 5/24 Neurologic exam:  Cognition: AAO to person, place, time and  event.  Language: Fluent, No substitutions or neoglisms. No dysarthria.  Memory: No apparent deficits. Insight: Good insight into current condition.  Mood: Moderate anxiety, pleasant mood and affect.  Restless. Sensation: Equal and intact in BL UE and Les that a stocking glove pattern from the right mid calf into the toes         Assessment/Plan: 1. Functional deficits which require 3+ hours per day of interdisciplinary therapy in a comprehensive inpatient rehab setting. Physiatrist is providing close team supervision and 24 hour management of active medical problems listed below. Physiatrist and rehab team continue to assess barriers to discharge/monitor patient progress toward functional and medical goals  Care Tool:  Bathing    Body parts bathed by patient: Right arm, Left arm, Abdomen, Front perineal area, Chest, Buttocks, Right upper leg, Left upper leg, Right lower leg, Face, Left lower leg         Bathing  assist Assist Level: Contact Guard/Touching assist     Upper Body Dressing/Undressing Upper body dressing   What is the patient wearing?: Pull over shirt    Upper body assist Assist Level: Supervision/Verbal cueing    Lower Body Dressing/Undressing Lower body dressing      What is the patient wearing?: Ace wrap/stump shrinker     Lower body assist Assist for lower body dressing: Set up assist     Toileting Toileting    Toileting assist Assist for toileting: Contact Guard/Touching assist     Transfers Chair/bed transfer  Transfers assist     Chair/bed transfer assist level: Contact Guard/Touching assist (lateral scoot)     Locomotion Ambulation   Ambulation assist      Assist level: Contact Guard/Touching assist Assistive device: Walker-rolling Max distance: 175'   Walk 10 feet activity   Assist     Assist level: Contact Guard/Touching assist Assistive device: Walker-rolling   Walk 50 feet activity   Assist    Assist level:  Contact Guard/Touching assist Assistive device: Walker-rolling    Walk 150 feet activity   Assist Walk 150 feet activity did not occur: Safety/medical concerns         Walk 10 feet on uneven surface  activity   Assist     Assist level: Minimal Assistance - Patient > 75% Assistive device: Walker-rolling   Wheelchair     Assist Is the patient using a wheelchair?: Yes      Wheelchair assist level: Supervision/Verbal cueing Max wheelchair distance: 300'    Wheelchair 50 feet with 2 turns activity    Assist        Assist Level: Supervision/Verbal cueing   Wheelchair 150 feet activity     Assist      Assist Level: Supervision/Verbal cueing   Blood pressure 119/71, pulse (!) 56, temperature 98.1 F (36.7 C), resp. rate 18, height 6\' 4"  (1.93 m), weight 90.7 kg, SpO2 96%.  1. 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             Stable to continue CIR  - 5-22: Order for additional shrinker sock and MP shield today  2.  Antithrombotics: -DVT/anticoagulation:  Pharmaceutical: Lovenox  added             -antiplatelet therapy:   3. Pain Management: Continue Oxycodone  prn. On home dose gabapentin  but will increase gabapentin  to 1200 mg TID and increase Oxy to 10-15 mg q4 hours prn.   5-21: Scheduled Tylenol  1000 mg 3 times daily, oxycodone  5 mg 3 times daily.  Reduce as needed oxycodone  to 5 to 10 mg every 4 hours as needed.  Added Robaxin  750 mg as needed.  5-22: Pain better controlled on current regimen.  5-23: Discussed phantom pains, patient states they are intermittent and does not want additional medication treatment at this time.  4. Mood/Behavior/Sleep: LCSW to follow for evaluation and support.              -antipsychotic agents: Caplyta  and Lithium .             --trazodone  prn for chronic insomnia.  --Using nightly  5. Neuropsych/cognition: This patient is capable of making decisions on his own behalf. 6. Skin/Wound Care: Continue  wound VAC--can change to prevena if present in room.              --continue Vitamin C  and Zinc . Prostat added for wound healing.   5-21: Wound VAC 1 week per Dr. Lyndon Santiago (5/27); minimal output at this point  7. Fluids/Electrolytes/Nutrition:  Monitor I/O. Check CMET in am.  5-21: AM labs with mild increase in creatinine; discussed p.o. fluid intakes with patient, encouraging 8 cups of fluid per day, will retest BMP 1 to 2 days  5-22: BUN, creatinine improved.  Monitor.  5-23: Appreciate dietary recommendations with supplementation with Glucerna, Juven, and Prosource for protein and wound healing.  Will monitor weights weekly and assess p.o. intakes as needed.  8.  T2DM: Hgb A1c-7.7  (improved from 10.9 in Feb '25)             --monitor BS ac/hs and use SSI for elevated BS.             --continue Insulin  glargine 40 units/HS and novolog  4 units tid ac.    5-21: Blood sugars generally well-controlled on current regimen.   5-23: Sugars mildly elevated this a.m., had a low in the 90s yesterday so we will trend today and if ongoing greater than 150 will adjust glargine--increased to 43 units at bedtime  Monitor for 24 hours. Recent Labs    10/24/23 2048 10/25/23 0651 10/25/23 1206  GLUCAP 231* 113* 142*     9. HTN: Monitor BP tid. Not on altace  at this time.   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 PUD/Candida esophagitis hx: Recent episode of gastroenteritis. Dr. Willy Harvest  12. Bipolar d/o/GAD w/recent severe depressive episode: On Effexor , Adderall, Clonazepam , Gabapentin  and Caplyta .              --Lithium  added May 15th by Dr. Lan[facility]. Continue  13.  ADHD: continue Adderall 30 mg bid (increased 10/05/23  for activation)  14. B12 deficiency?:Will check level in am.  377, normal.  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 Constipation- LBM 6 days ago- will increase bowel meds  Last bowel movement 5-21  5-23: Patient endorsing multiple bowel movements from formed to loose this a.m.  DC scheduled MiraLAX  continue Colace 100 mg twice daily.  17. Cough with low grade fever- will get CXR 1 view-resolved  Patient complains of ongoing cough, minimal production, no fevers or chills.  Repeat chest x-ray negative.  Continue daily Claritin 10 mg and Flonase 1 spray bilateral nares daily.   18. Bradycardia: medications reviewed and he is not on any rate controlling agents  19. Left elbow pain: XR ordered  20. Left 5th digit impaired adduction: discussed could be 2/2 ulnar nerve compression at the elbow  LOS: 5 days A FACE TO FACE EVALUATION WAS PERFORMED  Keven Pel Aarin Sparkman 10/25/2023, 12:52 PM

## 2023-10-25 NOTE — Progress Notes (Signed)
 Physical Therapy Session Note  Patient Details  Name: [name] MRN: [id number] Date of Birth: 1966/03/06  Today's Date: 10/25/2023 PT Individual Time: 0858-1005 PT Individual Time Calculation (min): 67 min   Short Term Goals: Week 1:  PT Short Term Goal 1 (Week 1): STGs = LTGs  Skilled Therapeutic Interventions/Progress Updates:  Pt received supine in bed; pt reports absent pain; agreeable to PT tx.  Therex: R QS x 20, R SLR x 20, R SLR/hip abduction combo x 20. L SLR x 20 with concentration on eccentric control, L SLR/hip abduction/adduction combo x 20, R SL bridge x 20, standing with RW CGA unilat forward punch x 20, standing unilat shoulder flexion x 20 using RW and with fatigue with R LE with flexion requiring PT verbal cues to extend knee to reduce risk of knee buckling. Sidelying L hip abduction x 20, prone L hip extension x 20, prone L hip extension/abduction/adduction x 20.  Gait: with RW CGA x 168 ft with PT verbal cues to decrease shoulder tension and discussion of efficiency of hop.   Pt independently donned stump guard after supine therex. Supine to sit transfers MI. Pt left in wheelchair, all needs within reach, no complaints of pain after tx.         Therapy Documentation Precautions:  Precautions Precautions: Fall Precaution/Restrictions Comments: LLE wound vac Restrictions Weight Bearing Restrictions Per Provider Order: Yes LLE Weight Bearing Per Provider Order: Non weight bearing    Therapy/Group: Individual Therapy  Jeannene Milling 10/25/2023, 7:08 AM

## 2023-10-25 NOTE — Plan of Care (Signed)
  Problem: Consults Goal: RH LIMB LOSS PATIENT EDUCATION Description: Description: See Patient Education module for eduction specifics. Outcome: Progressing Goal: Skin Care Protocol Initiated - if Braden Score 18 or less Description: If consults are not indicated, leave blank or document N/A Outcome: Progressing Goal: Diabetes Guidelines if Diabetic/Glucose > 140 Description: If diabetic or lab glucose is > 140 mg/dl - Initiate Diabetes/Hyperglycemia Guidelines & Document Interventions  Outcome: Progressing   Problem: RH BOWEL ELIMINATION Goal: RH STG MANAGE BOWEL WITH ASSISTANCE Description: STG Manage Bowel with Supervision Assistance. Outcome: Progressing Goal: RH STG MANAGE BOWEL W/MEDICATION W/ASSISTANCE Description: STG Manage Bowel with Medication with Supervision Assistance. Outcome: Progressing   Problem: RH SKIN INTEGRITY Goal: RH STG MAINTAIN SKIN INTEGRITY WITH ASSISTANCE Description: STG Maintain Skin Integrity With Supervision Assistance. Outcome: Progressing Goal: RH STG ABLE TO PERFORM INCISION/WOUND CARE W/ASSISTANCE Description: STG Able To Perform Incision/Wound Care With Supervision Assistance. Outcome: Progressing   Problem: RH PAIN MANAGEMENT Goal: RH STG PAIN MANAGED AT OR BELOW PT'S PAIN GOAL Description: < 3 on a 0-10 pain scale. Outcome: Progressing

## 2023-10-25 NOTE — Progress Notes (Signed)
 Occupational Therapy Session Note  Patient Details  Name: [name] MRN: [id number] Date of Birth: [birth year]  Today's Date: 10/25/2023 OT Individual Time: 1047-1200+1405-1500( 55 mins)  OT Individual Time Calculation (min): 73 min    Short Term Goals: Week 1:  OT Short Term Goal 1 (Week 1): STG= LTG d/t ELOS  Skilled Therapeutic Interventions/Progress Updates:  Session 1: Pt greeted seated in w/c, pt agreeable to OT intervention.      Transfers/bed mobility/functional mobility: pt completed lateral scoot transfer from w/c<>EOM with CGA, MIN cues for set- up and technique.  Pt completed w/c propulsion to/from gym space with supervision.    ADLs:  Grooming: pt completed seated oral care at sink MODI LB dressing: pt able to don 2XL shrinker from w/c with set- up assist.    Exercises:   Pt completed core strengthening task from EOM with pt instructed to complete lateral leans to R and L side of mat to retrieve Beans bags and then return to midline to toss bean bags to target.pt completed task with supervision and no LOB.   Pt completed below boxing combinations with an emphasis on facilitating improved core strength and global endurance"  2 mins of cross+ cross+ upper cut for 2 mins 30 sec rest  2 mins of bilateral hooks for 2 mins 30 sec rest Added core component with modified crunch and alternating punches for 2 mins 30 sec rest break  Modifed curnch with bilateral punch for 2 mins   Pt completed below prone therex to strength transverse abdominus/and promote hip extension: X10 supermans holding for 3 secs Baby cobras x10 reps holding for 5 secs each   Prone knee bends x20 reps  X20 hip abd/add  X20 quad sets X20 inner thigh squeeze   Ended session with pt seated in w/c with all needs within reach and pt speaking with MD.    Session 2: Pt greeted seated in w/c, pt agreeable to OT intervention.      Transfers/bed mobility/functional mobility: pt completed all  w/c propulsion with supervision. Pt completed lateral scoot transfers from w/c<>EOM with CGA.   Pt completed ~ 15 ft of functional hops in gym with RW and CGA.   Therapeutic activity:  Pt completed various therapeutic activities with a focus on standing tolerance and balance. -pt instructed to stand with RW to engage in bimanual tasks to challenge dynamic standing balance for higher level ADLs. Graded task up and instructed pt to reach posteriorly to retrieve various items to simulate LB ADLs, pt completed task with unilateral support and supervision.    Pt also engaged in mirror therapy for phantom pain from long sitting position on mat. Pt instructed in various exercises from long sitting while pt was instructed to look into mirror at intact limb.   Pt completed reactive balance task from sitting with pt instructed to toss basket ball to rebounder from EOM to challenge dynamic sitting balance. Pt completed x20 reps with CGA.  Graded task up and had pt complete tosses to rebounder in standing. Pt completed task with overall MIN- MODA. pt did experience LOB during this task but pt demonstrating appropriate balance reactions.     ADLs:  Grooming: pt stood at sink with CGA for standing hand hygiene.   Transfers: pt completed ambulatory toilet transfer into bathroom with RW and CGA.  Toileting: pt with continent urine void completing 3/3 toileting tasks with supervision   Exercises:  Pt completed step ups to 2 inch step with BUE support  from Rw for RLE strengthening and to further challenge balance. Pt completed x10 reps with MINA.   Pt completed seated tosses to rebounder with 6 lb weighted ball x20 reps to facilitate improved BUE strength and promote intrinsic core strength. Graded task up and instructed pt to complete modified crunch from EOM and then toss weighted ball. Pt completed x20 reps with CGA for balance.   Pt completed 1 min of "wood chops" with 6 lb weighted ball from EOM.    Ended session with pt supine in bed with all needs within reach and bed alarm activated.                    Therapy Documentation Precautions:  Precautions Precautions: Fall Precaution/Restrictions Comments: LLE wound vac Restrictions Weight Bearing Restrictions Per Provider Order: Yes LLE Weight Bearing Per Provider Order: Non weight bearing  Pain: Session 1: unrated moments of "spams" in LLE, rest breaks provided as needed Session 2: unrated pain phantom pain reported in LLE, mirror exercises provided as well as repositioning and rest breaks.     Therapy/Group: Individual Therapy  Willadean Hark 10/25/2023, 12:11 PM

## 2023-10-25 NOTE — Progress Notes (Signed)
 Per flowsheet patient had bm during therapy.  Randeen Busman, LPN

## 2023-10-26 LAB — CBC
HCT: 33.6 % — ABNORMAL LOW (ref 39.0–52.0)
Hemoglobin: 11.2 g/dL — ABNORMAL LOW (ref 13.0–17.0)
MCH: 28.7 pg (ref 26.0–34.0)
MCHC: 33.3 g/dL (ref 30.0–36.0)
MCV: 86.2 fL (ref 80.0–100.0)
Platelets: 373 10*3/uL (ref 150–400)
RBC: 3.9 MIL/uL — ABNORMAL LOW (ref 4.22–5.81)
RDW: 13.6 % (ref 11.5–15.5)
WBC: 7.3 10*3/uL (ref 4.0–10.5)
nRBC: 0 % (ref 0.0–0.2)

## 2023-10-26 LAB — BASIC METABOLIC PANEL WITH GFR
Anion gap: 7 (ref 5–15)
BUN: 21 mg/dL — ABNORMAL HIGH (ref 6–20)
CO2: 27 mmol/L (ref 22–32)
Calcium: 9.4 mg/dL (ref 8.9–10.3)
Chloride: 105 mmol/L (ref 98–111)
Creatinine, Ser: 1.32 mg/dL — ABNORMAL HIGH (ref 0.61–1.24)
GFR, Estimated: >60 mL/min (ref >60)
Glucose, Bld: 154 mg/dL — ABNORMAL HIGH (ref 70–99)
Potassium: 4.5 mmol/L (ref 3.5–5.1)
Sodium: 139 mmol/L (ref 135–145)

## 2023-10-26 LAB — GLUCOSE, CAPILLARY
Glucose-Capillary: 147 mg/dL — ABNORMAL HIGH (ref 70–99)
Glucose-Capillary: 129 mg/dL — ABNORMAL HIGH (ref 70–99)
Glucose-Capillary: 211 mg/dL — ABNORMAL HIGH (ref 70–99)
Glucose-Capillary: 126 mg/dL — ABNORMAL HIGH (ref 70–99)

## 2023-10-26 MED ORDER — INSULIN ASPART 100 UNIT/ML IJ SOLN
4.0000 [IU] | Freq: Two times a day (BID) | INTRAMUSCULAR | Status: DC
  Administered 2023-10-26 – 2023-10-31 (×8): 4 [IU] via SUBCUTANEOUS

## 2023-10-26 MED ORDER — GABAPENTIN 100 MG PO CAPS
200.0000 mg | ORAL_CAPSULE | Freq: Every day | ORAL | Status: DC
  Administered 2023-10-26 – 2023-10-28 (×3): 200 mg via ORAL
  Filled 2023-10-26 (×3): qty 2

## 2023-10-26 MED ORDER — DICLOFENAC SODIUM 1 % EX GEL
2.0000 g | Freq: Four times a day (QID) | CUTANEOUS | Status: DC
  Administered 2023-10-26 – 2023-10-31 (×17): 2 g via TOPICAL
  Filled 2023-10-26: qty 100

## 2023-10-26 MED ORDER — GABAPENTIN 400 MG PO CAPS
800.0000 mg | ORAL_CAPSULE | Freq: Three times a day (TID) | ORAL | Status: DC
  Administered 2023-10-26 – 2023-10-31 (×14): 800 mg via ORAL
  Filled 2023-10-26 (×14): qty 2

## 2023-10-26 MED ORDER — INSULIN ASPART 100 UNIT/ML IJ SOLN
6.0000 [IU] | Freq: Every day | INTRAMUSCULAR | Status: DC
  Administered 2023-10-26 – 2023-10-30 (×5): 6 [IU] via SUBCUTANEOUS

## 2023-10-26 NOTE — Plan of Care (Signed)
  Problem: Consults Goal: RH LIMB LOSS PATIENT EDUCATION Description: Description: See Patient Education module for eduction specifics. Outcome: Progressing Goal: Skin Care Protocol Initiated - if Braden Score 18 or less Description: If consults are not indicated, leave blank or document N/A Outcome: Progressing Goal: Diabetes Guidelines if Diabetic/Glucose > 140 Description: If diabetic or lab glucose is > 140 mg/dl - Initiate Diabetes/Hyperglycemia Guidelines & Document Interventions  Outcome: Progressing   Problem: RH BOWEL ELIMINATION Goal: RH STG MANAGE BOWEL WITH ASSISTANCE Description: STG Manage Bowel with Supervision Assistance. Outcome: Progressing Goal: RH STG MANAGE BOWEL W/MEDICATION W/ASSISTANCE Description: STG Manage Bowel with Medication with Supervision Assistance. Outcome: Progressing   Problem: RH SKIN INTEGRITY Goal: RH STG MAINTAIN SKIN INTEGRITY WITH ASSISTANCE Description: STG Maintain Skin Integrity With Supervision Assistance. Outcome: Progressing Goal: RH STG ABLE TO PERFORM INCISION/WOUND CARE W/ASSISTANCE Description: STG Able To Perform Incision/Wound Care With Supervision Assistance. Outcome: Progressing   Problem: RH PAIN MANAGEMENT Goal: RH STG PAIN MANAGED AT OR BELOW PT'S PAIN GOAL Description: < 3 on a 0-10 pain scale. Outcome: Progressing   Problem: Health Behavior/Discharge Planning: Goal: Ability to identify and utilize available resources and services will improve Outcome: Progressing Goal: Ability to manage health-related needs will improve Outcome: Progressing

## 2023-10-26 NOTE — Plan of Care (Signed)
    Problem: Consults Goal: RH LIMB LOSS PATIENT EDUCATION Description: Description: See Patient Education module for eduction specifics. Outcome: Progressing Goal: Skin Care Protocol Initiated - if Braden Score 18 or less Description: If consults are not indicated, leave blank or document N/A Outcome: Progressing Goal: Diabetes Guidelines if Diabetic/Glucose > 140 Description: If diabetic or lab glucose is > 140 mg/dl - Initiate Diabetes/Hyperglycemia Guidelines & Document Interventions  Outcome: Progressing   Problem: RH BOWEL ELIMINATION Goal: RH STG MANAGE BOWEL WITH ASSISTANCE Description: STG Manage Bowel with Supervision Assistance. Outcome: Progressing Goal: RH STG MANAGE BOWEL W/MEDICATION W/ASSISTANCE Description: STG Manage Bowel with Medication with Supervision Assistance. Outcome: Progressing   Problem: RH SKIN INTEGRITY Goal: RH STG MAINTAIN SKIN INTEGRITY WITH ASSISTANCE Description: STG Maintain Skin Integrity With Supervision Assistance. Outcome: Progressing Goal: RH STG ABLE TO PERFORM INCISION/WOUND CARE W/ASSISTANCE Description: STG Able To Perform Incision/Wound Care With Supervision Assistance. Outcome: Progressing   Problem: RH PAIN MANAGEMENT Goal: RH STG PAIN MANAGED AT OR BELOW PT'S PAIN GOAL Description: < 3 on a 0-10 pain scale. Outcome: Progressing

## 2023-10-26 NOTE — Progress Notes (Signed)
 Physical Therapy Session Note  Patient Details  Name: [name] MRN: [id number] Date of Birth: 02-May-1966  Today's Date: 10/26/2023 PT Individual Time: 1002-1102 PT Individual Time Calculation (min): 60 min   Today's Date: 10/26/2023 PT Individual Time: 1191-4782 PT Individual Time Calculation (min): 73 min   Short Term Goals: Week 1:  PT Short Term Goal 1 (Week 1): STGs = LTGs  Skilled Therapeutic Interventions/Progress Updates:     Pt received seated in WC and agrees to therapy. Reports some pain in LLE. Number not provided. PT provides rest breaks as needed to manage pain. Pt propels WC x150' to gym with BUEs for mobility and endurance training. Pt completes squat pivot transfer to mat table with cues for positioning. Pt completes supine therex to challenge strength and ROM, as well as provide NMR for LLE. Pt completes x25 ankle pumps, x15 quad sets and glute sets, SLRs, hip abduction, and SAQs. Pt performs additional set of SLR, hip abduction, and SAQs, and then additional progression of hip abduction against gravity in sidelying, with PT providing verbal and tactile cues for NM feedback and optimal body mechanics. Supine to sit independently. Squat pivot back to [facility] with cues for sequencing. Pt self propels WC to laundry room and moves laundry from washer to dryer with cues for optimal positioning. Pt then ambulates x175' with CGA and RW, with cues for upright gaze to improve posture and balance.   Pt self propels WC back to room. Left seated with all needs within reach.   2nd Session: Pt received supine in bed and agrees to therapy. No complaint of pain. Pt performs bed mobility independently. Squat pivot from bed>WC>Nustep with cues for positioning and sequencing. Pt completes Nustep for endurance training. Pt completes x15:00 at workload of 6 with average steps per minute ~75. PT provides cues for hand and foot placement and completing full available ROM.   Following rest break,  pt participate in Wii sports activity to promote standing balance training, as well as endurance and coordination challenge. Pt performs multiple reps of sit to stand with cues for hand placement and body mechanics. In standing, pt utilizes LUE support only on RW, while performing coordinated fine and gross motor skills with RUE. PT provides occasional minA at trunk for stability, but otherwise pt completes with CGA. Pt self propels WC back to room. Squat pivot back to bed with cues for positioning and safety. Left semi reclined with all needs within reach.   Therapy Documentation Precautions:  Precautions Precautions: Fall Precaution/Restrictions Comments: LLE wound vac Restrictions Weight Bearing Restrictions Per Provider Order: Yes LLE Weight Bearing Per Provider Order: Non weight bearing   Therapy/Group: Individual Therapy  Neva Barban, PT, DPT 10/26/2023, 4:39 PM

## 2023-10-26 NOTE — Progress Notes (Signed)
 PROGRESS NOTE   Subjective/Complaints:    No events overnight.  Patient complains of escalating phantom pains over the weekend, feeling like he is getting kicked in the shins.  States it is especially bad overnight, with last night staying up most of the evening struggling with pain.  He did not ask for medication.  Patient describes pain primarily as hypersensitivity of his left medial epicondyle on his elbow, worsened with using walker for weightbearing.  Denies any recent acute injury.  BUN and creatinine elevated today, 21/1.32.  Otherwise, labs stable.    Hemoglobin stable.  P.o. fluid intakes pretty adequate, 1.3 L yesterday, however excessive urinary output documented, 5000 ml yesterday? Vitals appear stable Last documented bowel movement recorded 5-21, medium.  ROS: Denies fevers, chills, N/V, abdominal pain, constipation, diarrhea, SOB, cough, chest pain, new weakness or paraesthesias.   + Pain over surgical site--stable + phantom pain--worsening + loose stools--resolved + left elbow pain--worsening  Objective:   DG Elbow 2 Views Left Result Date: 10/25/2023 CLINICAL DATA:  Left elbow pain EXAM: LEFT ELBOW - 2 VIEW COMPARISON:  None Available. FINDINGS: Elongated and partially chronically fragmented or corticated spur of the olecranon the triceps insertion site. There is also a double contour of calcification along the radial epicondylar region probably from spurring involving the common extensor tendon origination site. No joint effusion or malalignment. No visible fracture. IMPRESSION: 1. Elongated and partially chronically fragmented or corticated spur of the olecranon at the triceps insertion site. 2. Double contour of calcification along the radial epicondylar region probably from spurring involving the common extensor tendon origination site. 3. If pain persists despite conservative therapy, MRI may be warranted for  further characterization. Electronically Signed   By: Freida Jes M.D.   On: 10/25/2023 16:33    Recent Labs    10/26/23 0549  WBC 7.3  HGB 11.2*  HCT 33.6*  PLT 373    Recent Labs    10/26/23 0549  NA 139  K 4.5  CL 105  CO2 27  GLUCOSE 154*  BUN 21*  CREATININE 1.32*  CALCIUM  9.4     Intake/Output Summary (Last 24 hours) at 10/26/2023 0947 Last data filed at 10/26/2023 0848 Gross per 24 hour  Intake 1320 ml  Output 4900 ml  Net -3580 ml        Physical Exam: Vital Signs Blood pressure 113/66, pulse 61, temperature 98.1 F (36.7 C), temperature source Oral, resp. rate 18, height 6\' 4"  (1.93 m), weight 90.7 kg, SpO2 98%. Constitutional: No apparent distress. Appropriate appearance for age.  Sitting up in therapy gym.  HENT: No JVD. Neck Supple. Trachea midline. Atraumatic, normocephalic. Eyes: PERRLA. EOMI. Visual fields grossly intact.  Cardiovascular: Bradycardic. Peripheral pulses 2+  Respiratory: CTAB. No rales, rhonchi, or wheezing. On RA.  Abdomen: + bowel sounds, normoactive. No distention or tenderness.  Skin: C/D/I. No apparent lesions.  Left BKA wound VAC in place, with no current output. MSK:      Positive left BKA, minimal edema, well-formed. + Limb guard in place.      Tenderness to palpation on left medial epicondyle; not exacerbated by resisted wrist extension or flexion.  No obvious deformity  or palpable effusion.       Strength:                RUE: 5/5 SA, 5/5 EF, 5/5 EE, 5/5 WE, 5/5 FF, 5/5 FA                 LUE: 5/5 SA, 5/5 EF, 5/5 EE, 5/5 WE, 5/5 FF, 5/5 FA                 RLE: 5-/5 HF, 5/5 KE, 5/5 DF, 5/5 EHL, 5/5 PF                 LLE:  5-/5 HF, 5/5 KE, stable 5/26  Neurologic exam:  Cognition: AAO to person, place, time and event.  Language: Fluent, No substitutions or neoglisms. No dysarthria.  Memory: No apparent deficits. Insight: Good insight into current condition.  Mood: Moderate anxiety, pleasant mood and affect.   Restless. Sensation: Equal and intact in BL UE and Les that a stocking glove pattern from the right mid calf into the toes--also slightly reduced over left 5th and 4th digits.         Assessment/Plan: 1. Functional deficits which require 3+ hours per day of interdisciplinary therapy in a comprehensive inpatient rehab setting. Physiatrist is providing close team supervision and 24 hour management of active medical problems listed below. Physiatrist and rehab team continue to assess barriers to discharge/monitor patient progress toward functional and medical goals  Care Tool:  Bathing    Body parts bathed by patient: Right arm, Left arm, Abdomen, Front perineal area, Chest, Buttocks, Right upper leg, Left upper leg, Right lower leg, Face, Left lower leg         Bathing assist Assist Level: Contact Guard/Touching assist     Upper Body Dressing/Undressing Upper body dressing   What is the patient wearing?: Pull over shirt    Upper body assist Assist Level: Supervision/Verbal cueing    Lower Body Dressing/Undressing Lower body dressing      What is the patient wearing?: Ace wrap/stump shrinker     Lower body assist Assist for lower body dressing: Set up assist     Toileting Toileting    Toileting assist Assist for toileting: Supervision/Verbal cueing     Transfers Chair/bed transfer  Transfers assist     Chair/bed transfer assist level: Contact Guard/Touching assist (lateral scoot)     Locomotion Ambulation   Ambulation assist      Assist level: Contact Guard/Touching assist Assistive device: Walker-rolling Max distance: 175'   Walk 10 feet activity   Assist     Assist level: Contact Guard/Touching assist Assistive device: Walker-rolling   Walk 50 feet activity   Assist    Assist level: Contact Guard/Touching assist Assistive device: Walker-rolling    Walk 150 feet activity   Assist Walk 150 feet activity did not occur: Safety/medical  concerns         Walk 10 feet on uneven surface  activity   Assist     Assist level: Minimal Assistance - Patient > 75% Assistive device: Walker-rolling   Wheelchair     Assist Is the patient using a wheelchair?: Yes      Wheelchair assist level: Supervision/Verbal cueing Max wheelchair distance: 300'    Wheelchair 50 feet with 2 turns activity    Assist        Assist Level: Supervision/Verbal cueing   Wheelchair 150 feet activity     Assist      Assist  Level: Supervision/Verbal cueing   Blood pressure 113/66, pulse 61, temperature 98.1 F (36.7 C), temperature source Oral, resp. rate 18, height 6\' 4"  (1.93 m), weight 90.7 kg, SpO2 98%.  1. 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             Stable to continue CIR  - 5-22: Order for additional shrinker sock and MP shield today  2.  Antithrombotics: -DVT/anticoagulation:  Pharmaceutical: Lovenox  added             -antiplatelet therapy:   3. Pain Management: Continue Oxycodone  prn. On home dose gabapentin  but will increase gabapentin  to 1200 mg TID and increase Oxy to 10-15 mg q4 hours prn.   5-21: Scheduled Tylenol  1000 mg 3 times daily, oxycodone  5 mg 3 times daily.  Reduce as needed oxycodone  to 5 to 10 mg every 4 hours as needed.  Added Robaxin 750 mg as needed.  5-22: Pain better controlled on current regimen.  5-23: Discussed phantom pains, patient states they are intermittent and does not want additional medication treatment at this time.  5-26: Increase gabapentin  to 800/800/1000 mg for increasing phantom limb pains at nighttime.  Voltaren gel to left elbow 4 times daily.  4. Mood/Behavior/Sleep: LCSW to follow for evaluation and support.              -antipsychotic agents: Caplyta  and Lithium .             --trazodone  prn for chronic insomnia.  --Using nightly  5. Neuropsych/cognition: This patient is capable of making decisions on his own behalf. 6. Skin/Wound Care: Continue wound VAC--can change to prevena if present in room.              --continue Vitamin C  and Zinc . Prostat added for wound healing.   5-21: Wound VAC 1 week per Dr. Lyndon Santiago (5/27); minimal output at this point  7. Fluids/Electrolytes/Nutrition:  Monitor I/O. Check CMET in am.  5-21: AM labs with mild increase in creatinine; discussed p.o. fluid intakes with patient, encouraging 8 cups of fluid per day, will retest BMP 1 to 2 days  5-22: BUN, creatinine improved.  Monitor.  5-23: Appreciate dietary recommendations with supplementation with Glucerna, Juven, and Prosource for protein and wound healing.  Will monitor weights weekly and assess p.o. intakes as needed.  5/26:  Uncertain of accuracy of I/Os, will inquire with nursing today (documented excessive UO 5+L daily)--likely inaccurate.. Increase PO fluid intakes--discussed with patient.  Recheck BMP in 2 days.  If continues to uptrend, may need to cut back on gabapentin .  8.  T2DM: Hgb A1c-7.7  (improved from 10.9 in Feb '25)             --monitor BS ac/hs and use SSI for elevated BS.             --continue Insulin  glargine 40 units/HS and novolog  4 units tid ac.    5-21: Blood sugars generally well-controlled on current regimen.   5-23: Sugars mildly elevated this a.m., had a low in the 90s yesterday so we will trend today and if ongoing greater than 150 will adjust glargine--increased to 43 units at bedtime 5/26: highs consistently at bedtime, likely undercoverage of dinner; increase dinner standing to  6 U  Monitor for 24 hours. Recent Labs    10/25/23 1621 10/25/23 2050 10/26/23 0617  GLUCAP 111* 284* 147*     9. HTN: Monitor BP tid. Not on altace  at this time.   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 PUD/Candida esophagitis hx: Recent episode of gastroenteritis. Dr. Willy Harvest  12. Bipolar d/o/GAD w/recent severe depressive episode: On Effexor , Adderall, Clonazepam , Gabapentin  and Caplyta .              --Lithium  added May 15th by Dr. Lan[facility]. Continue  13. ADHD: continue Adderall 30 mg bid (increased 10/05/23  for activation)  14. B12 deficiency?:Will check level in am.  377, normal.  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 Constipation- LBM 6 days ago- will increase bowel meds  Last bowel movement 5-21  5-23: Patient endorsing multiple bowel movements from formed to loose this a.m.  DC scheduled MiraLAX  continue Colace 100 mg twice daily.  5/26: LBM yesterday per patient  17. Cough with low grade fever- will get CXR 1 view-resolved  Patient complains of ongoing cough, minimal production, no fevers or chills.  Repeat chest x-ray negative.  Continue daily Claritin 10 mg and Flonase 1 spray bilateral nares daily.    - 5/26: Essentially resolved  18. Bradycardia: medications reviewed and he is not on any rate controlling agents  - Can be related to oxycodone  use, wean as tolerated, however no appreciable bradycardia in the last 24 hours.  19. Left elbow pain: XR ordered--most consistent with medial epicondylitis - 5/26: Xray with chronic calcifications of triceps insertion at olecranon + common extensor tendon at radial epicondyle - Consider MRI vs. OP orthopedics referral - Add Voltaren gel 4 times daily  20. Left 5th digit impaired adduction: discussed could be 2/2 ulnar nerve compression at the elbow  21. AKI 5/26: BUN/creatinine elevated.  Uncertain of accuracy of I/Os, will inquire with nursing today (documented excessive UO 5+L daily)--Per nursing, outputs likely inaccurate. -   Increase PO intakes. No diuretics. Recheck in 2 days.   LOS: 6 days A FACE TO FACE EVALUATION  WAS PERFORMED  Bea Lime 10/26/2023, 9:47 AM

## 2023-10-26 NOTE — Progress Notes (Signed)
 Occupational Therapy Session Note  Patient Details  Name: [name] MRN: [id number] Date of Birth: 12-14-1965  Today's Date: 10/26/2023 OT Individual Time: 4098-1191 OT Individual Time Calculation (min): 59 min    Short Term Goals: Week 1:  OT Short Term Goal 1 (Week 1): STG= LTG d/t ELOS  Skilled Therapeutic Interventions/Progress Updates:    Pt received supine with 7/10 pain in his residual limb- RN delivering medication for pain. He reported a bad night d/t urinal spillage and not sleeping well. He came to EOB with mod I. Min cueing for wound vac management. Squat pivot to the w/c with supervision. At the sink he completed ADLs sit <> stand. UB Adls with set up assist. LB with CGA sit <> stand, good wound vac management. He donned pants with CGA. He gathered clothes for laundry from the w/c- cueing for locking w/c for sitting balance safety. He was able to balance the laundry basket on his lap and propel the w/c to the washing machine room. He loaded clothes into the washing machine seated and then stood with CGA to place pod on in standing. Discussed use of a reacher at home, pt reports he will buy one. He then transferred to the therapy mat for generalized strengthening/standing balance circuit. He completed 3x15- modified sit ups, forward/overhead reach, and sit <> stand with unilateral support. He required min-mod A for the sit <> stands without the RW. Skilled cueing provided for pelvis rotation, upright trunk and head. He ended with mirror therapy to address LLE phantom pain/sensation. Pt guided through AROM of the RLE while looking in the mirror. He returned to his w/c following with (S), squat pivot. He returned to his room and was left sitting up with all needs met.   Therapy Documentation Precautions:  Precautions Precautions: Fall Precaution/Restrictions Comments: LLE wound vac Restrictions Weight Bearing Restrictions Per Provider Order: Yes LLE Weight Bearing Per  Provider Order: Non weight bearing  Therapy/Group: Individual Therapy  Una Ganser 10/26/2023, 9:10 AM

## 2023-10-27 LAB — GLUCOSE, CAPILLARY
Glucose-Capillary: 194 mg/dL — ABNORMAL HIGH (ref 70–99)
Glucose-Capillary: 211 mg/dL — ABNORMAL HIGH (ref 70–99)
Glucose-Capillary: 151 mg/dL — ABNORMAL HIGH (ref 70–99)
Glucose-Capillary: 171 mg/dL — ABNORMAL HIGH (ref 70–99)

## 2023-10-27 NOTE — Progress Notes (Signed)
 Physical Therapy Session Note  Patient Details  Name: [name] MRN: [id number] Date of Birth: 1965-08-08  Today's Date: 10/27/2023 PT Individual Time: 0904-1000 PT Individual Time Calculation (min): 56 min   Today's Date: 10/27/2023 PT Individual Time: 1601-1630 PT Individual Time Calculation (min): 29 min   Short Term Goals: Week 1:  PT Short Term Goal 1 (Week 1): STGs = LTGs  Skilled Therapeutic Interventions/Progress Updates:     1st Session: Pt received supine in bed asleep. Awakens to verbal stimuli. Agrees to therapy and reports pain in LLE. PT provides rest breaks and gentle mobility to manage pain. Pt performs bed mobility independently. Squat pivot transfer with PT stabilizing WC. Pt self propels WC x150' to gym for endurance and mobility training. Pt performs squat pivot to mat table with cues for positioning. Pt completes sitting balance and coordination activity, tossing ball into trampoline and catching rebound, x20. Pt then performs sit to stand with RW and cues for hand placement. Pt performs ball tossing activity in standing, with PT providing consistent minA at Lt hip to prevent Lt lateral LOBs. Pt able to correct temporarily and toss ball with CGA, but requires minA 90% of time. Pt completes 3x20 with seated rest break. Pt then transitions to prone with cues for positioning. Pt remains in prone for ~2 minutes and then progresses to propping on elbows for increased soft issue lengthening. PT provides cues for body mechanics and correct performance. Pt then completes x20 RLE hip extensions in prone and x20 with LLE to strengthen glutes and provide hip flexor stretch. Prone to supine to sit with cues for positioning. Squat pivot to [facility] with cues for hand placement. Pt self propels back to room. Left seated with all needs within reach.   2nd Session: Pt received supine in bed and agrees to therapy. Reports pain in Lt lower extremity. PT provides rest breaks as needed to  manage pain. Pt performs bed mobility independently. Stand pivot transfer to [facility] with cues for hand placement. Pt self propels WC to gym for endurance and mobility training. Pt performs squat pivot to mat with same cues and assistance. PT educates on balance of LE versus UE involvement in ambulation, and then pt ambulates x80' with close supervision and emphasis on increasing loading through RLE and lessening demand on upper extremities for energy conservation. Pt then completes cornhole activity to challenge balance, coordination, and lower extremity endurance. Pt initially retrieves bags with LUE and passes to RUE to toss bags. Following rest break, pt retrieves bags with RUE and transitions to LUE to toss. PT provides minA throughout for stability, with cues for glute engagement to improve balance. Pt ambulates x175' back to room with cues for scapular depression to improve body mechanics. Left supine with all needs within reach.   Therapy Documentation Precautions:  Precautions Precautions: Fall Precaution/Restrictions Comments: LLE wound vac Restrictions Weight Bearing Restrictions Per Provider Order: No LLE Weight Bearing Per Provider Order: Non weight bearing   Therapy/Group: Individual Therapy  Neva Barban, PT, DPT 10/27/2023, 5:00 PM

## 2023-10-27 NOTE — Discharge Instructions (Signed)
 Inpatient Rehab Discharge Instructions  JALAN FARISS Discharge date and time:  10/31/23  Activities/Precautions/ Functional Status: Activity: no lifting, driving, or strenuous exercise till cleared by MD Diet: diabetic diet Wound Care: keep wound clean and dry   Functional status:  ___ No restrictions     ___ Walk up steps independently ___ 24/7 supervision/assistance   ___ Walk up steps with assistance _X__ Intermittent supervision/assistance  ___ Bathe/dress independently ___ Walk with walker     ___ Bathe/dress with assistance ___ Walk Independently    ___ Shower independently ___ Walk with assistance    ___ Shower with assistance _X__ No alcohol     ___ Return to work/school ________   Special Instructions: Monitor blood sugars before meals and at bedtime. Use SSI per home protocol.   COMMUNITY REFERRALS UPON DISCHARGE:     THERAPY IS DEFERRED UNTIL APPROPRIATE FOR PROSTHESIS. BE SURE TO GET REFERRAL FROM OUTPATIENT REHAB CLINIC OR ORTHOPEDIC SURGEON FOR PHYSICAL AND OCCUPATIONAL THERAPY.  Medical Equipment/Items Ordered:3in1 bedside commode, wheelchair, and tub transfer bench                                                 Agency/Supplier:[facility] [phone]    My questions have been answered and I understand these instructions. I will adhere to these goals and the provided educational materials after my discharge from the hospital.  Patient/Caregiver Signature _______________________________ Date __________  Clinician Signature _______________________________________ Date __________  Please bring this form and your medication list with you to all your follow-up doctor's appointments.

## 2023-10-27 NOTE — Patient Care Conference (Signed)
 Inpatient RehabilitationTeam Conference and Plan of Care Update Date: 10/27/2023   Time: 1015 am    Patient Name: [name]      Medical Record Number: [id number]  Date of Birth: 1965/10/06 Sex: Male         Room/Bed: 4W23C/4W23C-01 Payor Info: Payor: [place] MEDICAID PREPAID HEALTH PLAN / Plan: [place] MEDICAID HEALTHY BLUE / Product Type: *No Product type* /    Admit Date/Time:  10/20/2023 11:14 PM  Primary Diagnosis:  S/P BKA (below knee amputation) unilateral, left [facility])  Hospital Problems: Principal Problem:   S/P BKA (below knee amputation) unilateral, left (HCC) Active Problems:   Osteomyelitis of left foot (HCC)   Malnutrition of moderate degree   Bipolar II disorder [facility])    Expected Discharge Date: Expected Discharge Date: 10/31/23  Team Members Present: Physician leading conference: Dr. Cherri Corns Social Worker Present: Norval Been, LCSW Nurse Present: Jerene Monks, RN PT Present: Theodis Fiscal, PT OT Present: Eilene Grater, OT SLP Present: Tally Faes, SLP PPS Coordinator present : Jestine Moron, SLP     Current Status/Progress Goal Weekly Team Focus  Bowel/Bladder   continent using urinal. Last BM 5/25   at goal   monitor for constipation due to pain medication use.    Swallow/Nutrition/ Hydration               ADL's   mod I UB ADLs, (S)- CGA LB ADLS, (S) ADL transfers.   mod I   ADLs, transfers, UB strengthening, balance, IADLs    Mobility   independent bed mobility   mod(I) WC, Supervision Gait  strengthening, transfers, balance, ambulation, DC prep    Communication                Safety/Cognition/ Behavioral Observations               Pain   Patient continues to report moderate to severe pain to the left leg. Patient reports phantom pain. PRN Robaxin and oxycodone  administered per MAR. Patient does not report complete relief of pain but does state it helps with the shooting sensation he has been experiencing.   Pain will be  well controlled with scheduled/PRN pain medications   pain management    Skin   wound vac to left BKA. No drainage noted in canister. Wound vac dressing is clean, dry, and intact.   wound healing. Patient reports wound vac will be removed on 5/27.  improve wound healing      Discharge Planning:  Pt will d/c to home. His ister [name] will come to the home to help assist; she will work remotely. SW will confirm there are no barriers to discharge.    Team Discussion: Patient was admitted post Left BKA with wound vac due to osteomyelitis. Patient with pain to left elbow/ phantom limb pain/ hyperglycemia :medication adjusted by MD.  Patient on target to meet rehab goals: yes, currently patient requires mod I assistance with upper body care and CGA with lower body care.Patient needs supervision with transfers and mobility. Overall goals at discharge are for mod I assistance.  *See Care Plan and progress notes for long and short-term goals.   Revisions to Treatment Plan:  Dietician consult Neuro psych consult Prevalon boots Wound vac   Teaching Needs: Safety, medications, transfers, toileting, dietary modifications,incision care etc.    Current Barriers to Discharge: Decreased caregiver support, Home enviroment access/layout, and Weight bearing restrictions  Possible Resolutions to Barriers: Family Education Outpatient follow -up DME: RW, W/C, TTB,  [facility]       Medical Summary Current Status: medically complicated by wound vac s/p BKA, anxiety, poor pain control with phantom pains, AKI, and hyperglycemia  Barriers to Discharge: Behavior/Mood;Complicated Wound;Electrolyte abnormality;Uncontrolled Diabetes;Uncontrolled Pain;Self-care education;Renal Insufficiency/Failure   Possible Resolutions to [employer]: encourage fluid intakes and adjust medications/fluids for AKI, titrate pain medications to lowest tolerable dose, titrate insulin  for hyperglycemia   Continued Need  for Acute Rehabilitation Level of Care: The patient requires daily medical management by a physician with specialized training in physical medicine and rehabilitation for the following reasons: Direction of a multidisciplinary physical rehabilitation program to maximize functional independence : Yes Medical management of patient stability for increased activity during participation in an intensive rehabilitation regime.: Yes Analysis of laboratory values and/or radiology reports with any subsequent need for medication adjustment and/or medical intervention. : Yes   I attest that I was present, lead the team conference, and concur with the assessment and plan of the team.   Baylee Mccorkel Gayo 10/27/2023, 1015 am

## 2023-10-27 NOTE — Progress Notes (Signed)
 Occupational Therapy Session Note  Patient Details  Name: [name] MRN: [id number] Date of Birth: [birth year]  Session 1  Today's Date: 10/27/2023 OT Individual Time: 1117-1202 OT Individual Time Calculation (min): 45 min   Session 2  Today's Date: 10/27/2023 OT Individual Time: 1403-1505 OT Individual Time Calculation (min): 62 min    Short Term Goals: Week 1:  OT Short Term Goal 1 (Week 1): STG= LTG d/t ELOS  Skilled Therapeutic Interventions/Progress Updates:    Session 1 Pt received supine in the bed with c/o 7/10 in his L residual limb, reporting he is premedicated. He came to EOB with mod I and completed a squat pivot to the w/c with (S). Reduced OT set up of w/c and equipment to challenge pt to become more independent with this. He propelled the w/c during session with (S), close to mod I. Completed d/c planning at length, discussing home entry, accessibility, and AD/DME uses. Majority of session spent problem solving through/practicing threshold management at home. He practiced hopping over a 2x1 in threshold on level ground with the RW following a demonstration with CGA. Then graded this up to include a wider threshold- more like 3x3 in. He completed with CGA- one instance of min A when repositioning RW. Lastly completed full curb step onto a 4 in curb step. He required min A with this overall. He returned to the w/c and then to his room following. He was left supine with all needs met.    Session 2 Pt received supine in bed with 7/10 pain, RN delivering pain medication at start of session. Discussed DME/AD needs for home at length. Pt pulled up his [employer] cart and we looked through all of his choices with OT making additional recommendations/removing un-needed items to maximize safety and independence in the home. He then propelled the w/c to the therapy gym with mod I. He was placed into the [facility] harness for safety and to allow increase balance challenge/demands. He  completed dynamic throwing/catching with a ball, taking hands off the RW. He was able to complete 1-4 throws at a time with large LOB's that he required fast response on the RW. The sling provided support as well in what would have been full LOB's. This allowed him to challenge righting reactions and trunk stability with large perturbations. He returned to the room and was left supine with all needs met.    Therapy Documentation Precautions:  Precautions Precautions: Fall Precaution/Restrictions Comments: LLE wound vac Restrictions Weight Bearing Restrictions Per Provider Order: No LLE Weight Bearing Per Provider Order: Non weight bearing  Therapy/Group: Individual Therapy  Una Ganser 10/27/2023, 8:04 AM

## 2023-10-27 NOTE — Plan of Care (Signed)
  Problem: Consults Goal: RH LIMB LOSS PATIENT EDUCATION Description: Description: See Patient Education module for eduction specifics. Outcome: Progressing Goal: Skin Care Protocol Initiated - if Braden Score 18 or less Description: If consults are not indicated, leave blank or document N/A Outcome: Progressing Goal: Diabetes Guidelines if Diabetic/Glucose > 140 Description: If diabetic or lab glucose is > 140 mg/dl - Initiate Diabetes/Hyperglycemia Guidelines & Document Interventions  Outcome: Progressing   Problem: RH BOWEL ELIMINATION Goal: RH STG MANAGE BOWEL WITH ASSISTANCE Description: STG Manage Bowel with Supervision Assistance. Outcome: Progressing Goal: RH STG MANAGE BOWEL W/MEDICATION W/ASSISTANCE Description: STG Manage Bowel with Medication with Supervision Assistance. Outcome: Progressing   Problem: RH SKIN INTEGRITY Goal: RH STG MAINTAIN SKIN INTEGRITY WITH ASSISTANCE Description: STG Maintain Skin Integrity With Supervision Assistance. Outcome: Progressing Goal: RH STG ABLE TO PERFORM INCISION/WOUND CARE W/ASSISTANCE Description: STG Able To Perform Incision/Wound Care With Supervision Assistance. Outcome: Progressing   Problem: RH SAFETY Goal: RH STG ADHERE TO SAFETY PRECAUTIONS W/ASSISTANCE/DEVICE Description: STG Adhere to Safety Precautions With Supervision Assistance/Device. Outcome: Progressing Goal: RH STG DECREASED RISK OF FALL WITH ASSISTANCE Description: STG Decreased Risk of Fall With Supervision Assistance. Outcome: Progressing   Problem: RH PAIN MANAGEMENT Goal: RH STG PAIN MANAGED AT OR BELOW PT'S PAIN GOAL Description: < 3 on a 0-10 pain scale. Outcome: Progressing   Problem: RH KNOWLEDGE DEFICIT LIMB LOSS Goal: RH STG INCREASE KNOWLEDGE OF SELF CARE AFTER LIMB LOSS Description: Patient will demonstrate knowledge of incisional care, residual limb care, and pain management with educational materials and handouts provided by staff. Outcome:  Progressing   Problem: Education: Goal: Ability to describe self-care measures that may prevent or decrease complications (Diabetes Survival Skills Education) will improve Outcome: Progressing Goal: Individualized Educational Video(s) Outcome: Progressing   Problem: Coping: Goal: Ability to adjust to condition or change in health will improve Outcome: Progressing   Problem: Fluid Volume: Goal: Ability to maintain a balanced intake and output will improve Outcome: Progressing   Problem: Health Behavior/Discharge Planning: Goal: Ability to identify and utilize available resources and services will improve Outcome: Progressing Goal: Ability to manage health-related needs will improve Outcome: Progressing   Problem: Metabolic: Goal: Ability to maintain appropriate glucose levels will improve Outcome: Progressing   Problem: Nutritional: Goal: Maintenance of adequate nutrition will improve Outcome: Progressing Goal: Progress toward achieving an optimal weight will improve Outcome: Progressing   Problem: Skin Integrity: Goal: Risk for impaired skin integrity will decrease Outcome: Progressing   Problem: Tissue Perfusion: Goal: Adequacy of tissue perfusion will improve Outcome: Progressing

## 2023-10-27 NOTE — Patient Care Conference (Cosign Needed Addendum)
 Inpatient RehabilitationTeam Conference and Plan of Care Update Date: 10/27/2023   Time: 1015 am    Patient Name: [name]      Medical Record Number: [id number]  Date of Birth: 1966/02/19 Sex: Male         Room/Bed: 4W23C/4W23C-01 Payor Info: Payor: [place] MEDICAID PREPAID HEALTH PLAN / Plan: [place] MEDICAID HEALTHY BLUE / Product Type: *No Product type* /    Admit Date/Time:  10/20/2023 11:14 PM  Primary Diagnosis:  S/P BKA (below knee amputation) unilateral, left [facility])  Hospital Problems: Principal Problem:   S/P BKA (below knee amputation) unilateral, left (HCC) Active Problems:   Osteomyelitis of left foot (HCC)   Malnutrition of moderate degree   Bipolar II disorder [facility])    Expected Discharge Date: Expected Discharge Date: 10/31/23  Team Members Present: Physician leading conference: Dr. Cherri Corns Social Worker Present: Norval Been, LCSW Nurse Present: Jerene Monks, RN PT Present: Theodis Fiscal, PT OT Present: Eilene Grater, OT SLP Present: Tally Faes, SLP PPS Coordinator present : Jestine Moron, SLP     Current Status/Progress Goal Weekly Team Focus  Bowel/Bladder   continent using urinal. Last BM 5/25   at goal   monitor for constipation due to pain medication use.    Swallow/Nutrition/ Hydration               ADL's   mod I UB ADLs, (S)- CGA LB ADLS, (S) ADL transfers.   mod I   ADLs, transfers, UB strengthening, balance, IADLs    Mobility   independent bed mobility   mod(I) WC, Supervision Gait  strengthening, transfers, balance, ambulation, DC prep    Communication                Safety/Cognition/ Behavioral Observations               Pain   Patient continues to report moderate to severe pain to the left leg. Patient reports phantom pain. PRN Robaxin and oxycodone  administered per MAR. Patient does not report complete relief of pain but does state it helps with the shooting sensation he has been experiencing.   Pain will be  well controlled with scheduled/PRN pain medications   pain management    Skin   wound vac to left BKA. No drainage noted in canister. Wound vac dressing is clean, dry, and intact.   wound healing. Patient reports wound vac will be removed on 5/27.  improve wound healing      Discharge Planning:  Pt will d/c to home. His ister [name] will come to the home to help assist; she will work remotely. SW will confirm there are no barriers to discharge.    Team Discussion: Patient was admitted post Left BKA with wound vac due to osteomyelitis. Patient with pain to left elbow/hyperglycemia :medication adjusted by MD. Patient limited by phantom limb pain, anxiety .  Patient on target to meet rehab goals: yes,  currently patient requires mod I assist with upper body care and supervision-CGA with lower body care. Patient requires supervision assistance  with transfers and  ambulation. Overall goals at discharge are set for Mod I assistance.  *See Care Plan and progress notes for long and short-term goals.   Revisions to Treatment Plan:  Dietician consult Neuro psych consult Prevalon boots Wound vac  Teaching Needs:  Safety, medications, transfers, toileting, dietary modifications,incision care etc.   Current Barriers to Discharge: Decreased caregiver support, Home enviroment access/layout, and Weight bearing restrictions  Possible Resolutions to Barriers:  Family Education Outpatient follow -up DME: RW, W/C, TTB, BSC     Medical Summary Current Status: medically complicated by wound vac s/p BKA, anxiety, poor pain control with phantom pains, AKI, and hyperglycemia  Barriers to Discharge: Behavior/Mood;Complicated Wound;Electrolyte abnormality;Uncontrolled Diabetes;Uncontrolled Pain;Self-care education;Renal Insufficiency/Failure   Possible Resolutions to [employer]: encourage fluid intakes and adjust medications/fluids for AKI, titrate pain medications to lowest tolerable  dose, titrate insulin  for hyperglycemia   Continued Need for Acute Rehabilitation Level of Care: The patient requires daily medical management by a physician with specialized training in physical medicine and rehabilitation for the following reasons: Direction of a multidisciplinary physical rehabilitation program to maximize functional independence : Yes Medical management of patient stability for increased activity during participation in an intensive rehabilitation regime.: Yes Analysis of laboratory values and/or radiology reports with any subsequent need for medication adjustment and/or medical intervention. : Yes   I attest that I was present, lead the team conference, and concur with the assessment and plan of the team.   Alastair Hennes Gayo 10/27/2023,  1015 am

## 2023-10-27 NOTE — Progress Notes (Signed)
 Patient ID: [name], male   DOB: 09-08-1965, 58 y.o.   MRN: [id number]  SW met with pt in room to provide updates from team conference, and d/c date 5/31. He confirms that his sister will come down on Friday and will stay with him for 1 week. DME arrived in room- w/c, and TTB. Pt aware he will need to purchase RW. SW will order 3in1 BSC.   SW will ordered 3in1 BSC with [employer] via parachute.   Norval Been, MSW, LCSW Office: [phone] Cell: [phone] Fax: [phone]

## 2023-10-28 LAB — GLUCOSE, CAPILLARY
Glucose-Capillary: 129 mg/dL — ABNORMAL HIGH (ref 70–99)
Glucose-Capillary: 180 mg/dL — ABNORMAL HIGH (ref 70–99)
Glucose-Capillary: 119 mg/dL — ABNORMAL HIGH (ref 70–99)
Glucose-Capillary: 144 mg/dL — ABNORMAL HIGH (ref 70–99)
Glucose-Capillary: 147 mg/dL — ABNORMAL HIGH (ref 70–99)

## 2023-10-28 LAB — BASIC METABOLIC PANEL WITH GFR
Anion gap: 5 (ref 5–15)
BUN: 25 mg/dL — ABNORMAL HIGH (ref 6–20)
CO2: 26 mmol/L (ref 22–32)
Calcium: 9.1 mg/dL (ref 8.9–10.3)
Chloride: 105 mmol/L (ref 98–111)
Creatinine, Ser: 1.21 mg/dL (ref 0.61–1.24)
GFR, Estimated: >60 mL/min (ref >60)
Glucose, Bld: 210 mg/dL — ABNORMAL HIGH (ref 70–99)
Potassium: 3.9 mmol/L (ref 3.5–5.1)
Sodium: 136 mmol/L (ref 135–145)

## 2023-10-28 NOTE — Progress Notes (Signed)
 PROGRESS NOTE   Subjective/Complaints:  No events overnight.  Some confusion regarding removal of wound VAC yesterday; order placed for today.  Episode of hypotension overnight but overall vitals are stable.  P.o. intakes look good.  Patient states pain is ongoing but tolerable for therapies.  Does think he had a moment of redness and pain in his left external ear yesterday evening; self-limited, went away this morning.  States he gets an ear infection sometimes that spontaneously go away.  Labs this a.m. significant for uptrending BUN, decreasing creatinine.  Blood sugars elevated at 210.  Otherwise stable.  Overall have remained in a good range.  ROS: Denies fevers, chills, N/V, abdominal pain, constipation, diarrhea, SOB, cough, chest pain, new weakness or paraesthesias.   + Pain over surgical site--stable + phantom pain--worsening + left elbow pain--stable + Left ear pain-resolved  Objective:   No results found.   Recent Labs    10/26/23 0549  WBC 7.3  HGB 11.2*  HCT 33.6*  PLT 373    Recent Labs    10/26/23 0549 10/28/23 0515  NA 139 136  K 4.5 3.9  CL 105 105  CO2 27 26  GLUCOSE 154* 210*  BUN 21* 25*  CREATININE 1.32* 1.21  CALCIUM  9.4 9.1     Intake/Output Summary (Last 24 hours) at 10/28/2023 0840 Last data filed at 10/28/2023 0200 Gross per 24 hour  Intake 920 ml  Output 3275 ml  Net -2355 ml        Physical Exam: Vital Signs Blood pressure 110/74, pulse 61, temperature 97.6 F (36.4 C), resp. rate 17, height 6\' 4"  (1.93 m), weight 97.3 kg, SpO2 97%. Constitutional: No apparent distress. Appropriate appearance for age.  Sitting up in therapy gym. HENT: No JVD. Neck Supple. Trachea midline. Atraumatic, normocephalic. Otoscopic exam of the left ear reveals some minimal fluid in the behind the eardrum but no erythema or discharge.  Eyes: PERRLA. EOMI. Visual fields grossly intact.   Cardiovascular: Bradycardic. Peripheral pulses 2+  Respiratory: CTAB. No rales, rhonchi, or wheezing. On RA.  Abdomen: + bowel sounds, normoactive. No distention or tenderness.  Skin: C/D/I. No apparent lesions.  Wound VAC removed, staples intact with a few areas of serosanguineous drainage and small skin tag on right lateral suture margin.  No obvious areas of dehiscence or breakdown.  MSK:      Positive left BKA, minimal edema, well-formed.       Tenderness to palpation on left medial epicondyle; not exacerbated by resisted wrist extension or flexion.  No obvious deformity or palpable effusion.--Not retested today       Strength: 5 out of 5 in all 4 extremities  Neurologic exam:  Cognition: AAO to person, place, time and event.  Language: Fluent, No substitutions or neoglisms. No dysarthria.  Memory: No apparent deficits. Insight: Good insight into current condition.  Mood: Moderate anxiety, pleasant mood and affect.   Sensation: Equal and intact in BL UE and Les that a stocking glove pattern from the right mid calf into the toes--also slightly reduced over left 5th and 4th digits. Unchanged 5/20       Assessment/Plan: 1. Functional deficits which require 3+ hours  per day of interdisciplinary therapy in a comprehensive inpatient rehab setting. Physiatrist is providing close team supervision and 24 hour management of active medical problems listed below. Physiatrist and rehab team continue to assess barriers to discharge/monitor patient progress toward functional and medical goals  Care Tool:  Bathing    Body parts bathed by patient: Right arm, Left arm, Abdomen, Front perineal area, Chest, Buttocks, Right upper leg, Left upper leg, Right lower leg, Face, Left lower leg         Bathing assist Assist Level: Contact Guard/Touching assist     Upper Body Dressing/Undressing Upper body dressing   What is the patient wearing?: Pull over shirt    Upper body assist Assist  Level: Supervision/Verbal cueing    Lower Body Dressing/Undressing Lower body dressing      What is the patient wearing?: Ace wrap/stump shrinker     Lower body assist Assist for lower body dressing: Set up assist     Toileting Toileting    Toileting assist Assist for toileting: Supervision/Verbal cueing     Transfers Chair/bed transfer  Transfers assist     Chair/bed transfer assist level: Supervision/Verbal cueing     Locomotion Ambulation   Ambulation assist      Assist level: Contact Guard/Touching assist Assistive device: Walker-rolling Max distance: 175'   Walk 10 feet activity   Assist     Assist level: Contact Guard/Touching assist Assistive device: Walker-rolling   Walk 50 feet activity   Assist    Assist level: Contact Guard/Touching assist Assistive device: Walker-rolling    Walk 150 feet activity   Assist Walk 150 feet activity did not occur: Safety/medical concerns  Assist level: Contact Guard/Touching assist Assistive device: Walker-rolling    Walk 10 feet on uneven surface  activity   Assist     Assist level: Minimal Assistance - Patient > 75% Assistive device: Walker-rolling   Wheelchair     Assist Is the patient using a wheelchair?: Yes Type of Wheelchair: Manual    Wheelchair assist level: Independent Max wheelchair distance: 150'    Wheelchair 50 feet with 2 turns activity    Assist        Assist Level: Independent   Wheelchair 150 feet activity     Assist      Assist Level: Independent   Blood pressure 110/74, pulse 61, temperature 97.6 F (36.4 C), resp. rate 17, height 6\' 4"  (1.93 m), weight 97.3 kg, SpO2 97%.  1. Functional deficits secondary to L BKA due to osteomyelitis of L foot- subacute             -patient may not shower due to VAC             -ELOS/Goals: 10-14 days- mod I- going home with support from his sister, DC 5-31             Stable to continue CIR  - 5-22:  Order for additional shrinker sock and MP shield today  2.  Antithrombotics: -DVT/anticoagulation:  Pharmaceutical: Lovenox  added             -antiplatelet therapy:   3. Pain Management: Continue Oxycodone  prn. On home dose gabapentin  but will increase gabapentin  to 1200 mg TID and increase Oxy to 10-15 mg q4 hours prn.   5-21: Scheduled Tylenol  1000 mg 3 times daily, oxycodone  5 mg 3 times daily.  Reduce as needed oxycodone  to 5 to 10 mg every 4 hours as needed.  Added Robaxin  750 mg as needed.  5-22: Pain better controlled on current regimen.  5-23: Discussed phantom pains, patient states they are intermittent and does not want additional medication treatment at this time.  5-26: Increase gabapentin  to 800/800/1000 mg for increasing phantom limb pains at nighttime.  Voltaren gel to left elbow 4 times daily--hopeful pain will improve once back is off  4. Mood/Behavior/Sleep: LCSW to follow for evaluation and support.              -antipsychotic agents: Caplyta  and Lithium .             --trazodone  prn for chronic insomnia. --Using nightly  5. Neuropsych/cognition: This patient is capable of making decisions on his own behalf. 6. Skin/Wound Care: Continue wound VAC--can change to prevena if present in room.              --continue Vitamin C  and Zinc . Prostat added for wound healing.   5-21: Wound VAC 1 week per Dr. Lyndon Santiago (5/27); minimal output at this point--wound VAC removed 5-28  7. Fluids/Electrolytes/Nutrition:  Monitor I/O. Check CMET in am.  5-21: AM labs with mild increase in creatinine; discussed p.o. fluid intakes with patient, encouraging 8 cups of fluid per day, will retest BMP 1 to 2 days  5-22: BUN, creatinine improved.  Monitor.  5-23: Appreciate dietary recommendations with supplementation with Glucerna, Juven, and Prosource for protein and wound healing.  Will monitor weights weekly and assess p.o. intakes as needed.  5/26:  Uncertain of accuracy of I/Os, will inquire with  nursing today (documented excessive UO 5+L daily)--likely inaccurate.. Increase PO fluid intakes--discussed with patient.  Recheck BMP in 2 days.  If continues to uptrend, may need to cut back on gabapentin .  5-27: Labs in a.m.--BUN elevated, creatinine normalized as below.  8.  T2DM: Hgb A1c-7.7  (improved from 10.9 in Feb '25)             --monitor BS ac/hs and use SSI for elevated BS.             --continue Insulin  glargine 40 units/HS and novolog  4 units tid ac.    5-21: Blood sugars generally well-controlled on current regimen.   5-23: Sugars mildly elevated this a.m., had a low in the 90s yesterday so we will trend today and if ongoing greater than 150 will adjust glargine--increased to 43 units at bedtime 5/26: highs consistently at bedtime, likely undercoverage of dinner; increase dinner standing to 6 U 5-27: Looking better controlled --  Monitor on current regimen--May increase long-acting in 1 to 2 days if no lows  Monitor for 24 hours. Recent Labs    10/27/23 1734 10/27/23 2041 10/28/23 0545  GLUCAP 151* 171* 180*     9. HTN: Monitor BP tid. Not on altace  at this time.   - normotensive, monitor    10/28/2023    7:40 AM 10/28/2023    6:27 AM 10/28/2023    4:50 AM  Vitals with BMI  Weight 214 lbs 8 oz    BMI 26.12    Systolic  110 98  Diastolic  74 56  Pulse  61 58    10.  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 PUD/Candida esophagitis hx: Recent episode of gastroenteritis. Dr. Willy Harvest  12. Bipolar d/o/GAD w/recent severe depressive episode: On Effexor , Adderall, Clonazepam , Gabapentin  and Caplyta .              --Lithium  added May 15th by Dr. Lanetta [facility]. Continue  13. ADHD: continue Adderall 30 mg bid (  increased 10/05/23  for activation)  14. B12 deficiency?:Will check level in am.  377, normal.  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 Constipation- LBM 6 days ago- will increase bowel meds  Last bowel movement 5-21  5-23: Patient endorsing multiple  bowel movements from formed to loose this a.m.  DC scheduled MiraLAX  continue Colace 100 mg twice daily.  5/25: LBM --May need sorbitol if no bowel movement today  5-28: Large bowel movement   17. Cough with low grade fever- will get CXR 1 view-resolved  Patient complains of ongoing cough, minimal production, no fevers or chills.  Repeat chest x-ray negative.  Continue daily Claritin 10 mg and Flonase 1 spray bilateral nares daily.    - resolved  18. Bradycardia: medications reviewed and he is not on any rate controlling agents  - Can be related to oxycodone  use, wean as tolerated, however no appreciable bradycardia in the last 24 hours.  - Asymptomatic, has not recurred, monitor  19. Left elbow pain: XR ordered--most consistent with medial epicondylitis - 5/26: Xray with chronic calcifications of triceps insertion at olecranon + common extensor tendon at radial epicondyle - Consider MRI vs. OP orthopedics referral - Add Voltaren gel 4 times daily--stable, continue for anti-inflammatory effect  20. Left 5th digit impaired adduction: discussed could be 2/2 ulnar nerve compression at the elbow  21. AKI 5/26: BUN/creatinine elevated.  Uncertain of accuracy of I/Os, will inquire with nursing today (documented excessive UO 5+L daily)--Per nursing, outputs likely inaccurate. -   Increase PO intakes. No diuretics. Recheck in 2 days. - 5-28: Creatinine normalized, BUN remains elevated.  May be complicated by protein intake and wound healing.  Monitor closely.   22.  Left ear pain.  Self-limited, some changes from ongoing issues with allergies but nothing concerning for infection.  Monitor for now.  LOS: 8 days A FACE TO FACE EVALUATION WAS PERFORMED  Bea Lime 10/28/2023, 8:40 AM

## 2023-10-28 NOTE — Plan of Care (Signed)
  Problem: Consults Goal: RH LIMB LOSS PATIENT EDUCATION Description: Description: See Patient Education module for eduction specifics. Outcome: Progressing Goal: Skin Care Protocol Initiated - if Braden Score 18 or less Description: If consults are not indicated, leave blank or document N/A Outcome: Progressing Goal: Diabetes Guidelines if Diabetic/Glucose > 140 Description: If diabetic or lab glucose is > 140 mg/dl - Initiate Diabetes/Hyperglycemia Guidelines & Document Interventions  Outcome: Progressing   Problem: RH BOWEL ELIMINATION Goal: RH STG MANAGE BOWEL WITH ASSISTANCE Description: STG Manage Bowel with Supervision Assistance. Outcome: Progressing Goal: RH STG MANAGE BOWEL W/MEDICATION W/ASSISTANCE Description: STG Manage Bowel with Medication with Supervision Assistance. Outcome: Progressing   Problem: RH SKIN INTEGRITY Goal: RH STG MAINTAIN SKIN INTEGRITY WITH ASSISTANCE Description: STG Maintain Skin Integrity With Supervision Assistance. Outcome: Progressing Goal: RH STG ABLE TO PERFORM INCISION/WOUND CARE W/ASSISTANCE Description: STG Able To Perform Incision/Wound Care With Supervision Assistance. Outcome: Progressing   Problem: RH SAFETY Goal: RH STG ADHERE TO SAFETY PRECAUTIONS W/ASSISTANCE/DEVICE Description: STG Adhere to Safety Precautions With Supervision Assistance/Device. Outcome: Progressing Goal: RH STG DECREASED RISK OF FALL WITH ASSISTANCE Description: STG Decreased Risk of Fall With Supervision Assistance. Outcome: Progressing   Problem: RH PAIN MANAGEMENT Goal: RH STG PAIN MANAGED AT OR BELOW PT'S PAIN GOAL Description: < 3 on a 0-10 pain scale. Outcome: Progressing   Problem: RH KNOWLEDGE DEFICIT LIMB LOSS Goal: RH STG INCREASE KNOWLEDGE OF SELF CARE AFTER LIMB LOSS Description: Patient will demonstrate knowledge of incisional care, residual limb care, and pain management with educational materials and handouts provided by staff. Outcome:  Progressing   Problem: Education: Goal: Ability to describe self-care measures that may prevent or decrease complications (Diabetes Survival Skills Education) will improve Outcome: Progressing Goal: Individualized Educational Video(s) Outcome: Progressing   Problem: Coping: Goal: Ability to adjust to condition or change in health will improve Outcome: Progressing   Problem: Fluid Volume: Goal: Ability to maintain a balanced intake and output will improve Outcome: Progressing   Problem: Health Behavior/Discharge Planning: Goal: Ability to identify and utilize available resources and services will improve Outcome: Progressing Goal: Ability to manage health-related needs will improve Outcome: Progressing   Problem: Metabolic: Goal: Ability to maintain appropriate glucose levels will improve Outcome: Progressing   Problem: Nutritional: Goal: Maintenance of adequate nutrition will improve Outcome: Progressing Goal: Progress toward achieving an optimal weight will improve Outcome: Progressing   Problem: Skin Integrity: Goal: Risk for impaired skin integrity will decrease Outcome: Progressing   Problem: Tissue Perfusion: Goal: Adequacy of tissue perfusion will improve Outcome: Progressing

## 2023-10-28 NOTE — Progress Notes (Addendum)
 PROGRESS NOTE   Subjective/Complaints:  No events overnight.  Phantom pains continue to worsen despite adjustments in gabapentin .  Otherwise, no concerns or complaints.  Good reports per teams today, dissipating well, sister is coming to pick him up and will be driving to town on Friday.  Left elbow remains tender with Voltaren gel, but not worsening.  ROS: Denies fevers, chills, N/V, abdominal pain, constipation, diarrhea, SOB, cough, chest pain, new weakness or paraesthesias.   + Pain over surgical site--stable + phantom pain--worsening + left elbow pain--stable  Objective:   No results found.   Recent Labs    10/26/23 0549  WBC 7.3  HGB 11.2*  HCT 33.6*  PLT 373    Recent Labs    10/26/23 0549  NA 139  K 4.5  CL 105  CO2 27  GLUCOSE 154*  BUN 21*  CREATININE 1.32*  CALCIUM  9.4     Intake/Output Summary (Last 24 hours) at 10/28/2023 0005 Last data filed at 10/27/2023 2201 Gross per 24 hour  Intake 1020 ml  Output 3275 ml  Net -2255 ml        Physical Exam: Vital Signs Blood pressure 136/77, pulse 66, temperature 97.6 F (36.4 C), temperature source Oral, resp. rate 18, height 6\' 4"  (1.93 m), weight 90.7 kg, SpO2 96%. Constitutional: No apparent distress. Appropriate appearance for age.  Laying in therapy gym on mat. HENT: No JVD. Neck Supple. Trachea midline. Atraumatic, normocephalic. Eyes: PERRLA. EOMI. Visual fields grossly intact.  Cardiovascular: Bradycardic. Peripheral pulses 2+  Respiratory: CTAB. No rales, rhonchi, or wheezing. On RA.  Abdomen: + bowel sounds, normoactive. No distention or tenderness.  Skin: C/D/I. No apparent lesions.  Left BKA wound VAC in place, with no current output. MSK:      Positive left BKA, minimal edema, well-formed. + Limb guard in place.--Unchanged appearance      Tenderness to palpation on left medial epicondyle; not exacerbated by resisted wrist extension  or flexion.  No obvious deformity or palpable effusion.--Not retested today       Strength: 5 out of 5 in all 4 extremities  Neurologic exam:  Cognition: AAO to person, place, time and event.  Language: Fluent, No substitutions or neoglisms. No dysarthria.  Memory: No apparent deficits. Insight: Good insight into current condition.  Mood: Moderate anxiety, pleasant mood and affect.   Sensation: Equal and intact in BL UE and Les that a stocking glove pattern from the right mid calf into the toes--also slightly reduced over left 5th and 4th digits.   Unchanged 5-27       Assessment/Plan: 1. Functional deficits which require 3+ hours per day of interdisciplinary therapy in a comprehensive inpatient rehab setting. Physiatrist is providing close team supervision and 24 hour management of active medical problems listed below. Physiatrist and rehab team continue to assess barriers to discharge/monitor patient progress toward functional and medical goals  Care Tool:  Bathing    Body parts bathed by patient: Right arm, Left arm, Abdomen, Front perineal area, Chest, Buttocks, Right upper leg, Left upper leg, Right lower leg, Face, Left lower leg         Bathing assist Assist Level: Contact Guard/Touching  assist     Upper Body Dressing/Undressing Upper body dressing   What is the patient wearing?: Pull over shirt    Upper body assist Assist Level: Supervision/Verbal cueing    Lower Body Dressing/Undressing Lower body dressing      What is the patient wearing?: Ace wrap/stump shrinker     Lower body assist Assist for lower body dressing: Set up assist     Toileting Toileting    Toileting assist Assist for toileting: Supervision/Verbal cueing     Transfers Chair/bed transfer  Transfers assist     Chair/bed transfer assist level: Supervision/Verbal cueing     Locomotion Ambulation   Ambulation assist      Assist level: Contact Guard/Touching  assist Assistive device: Walker-rolling Max distance: 175'   Walk 10 feet activity   Assist     Assist level: Contact Guard/Touching assist Assistive device: Walker-rolling   Walk 50 feet activity   Assist    Assist level: Contact Guard/Touching assist Assistive device: Walker-rolling    Walk 150 feet activity   Assist Walk 150 feet activity did not occur: Safety/medical concerns  Assist level: Contact Guard/Touching assist Assistive device: Walker-rolling    Walk 10 feet on uneven surface  activity   Assist     Assist level: Minimal Assistance - Patient > 75% Assistive device: Walker-rolling   Wheelchair     Assist Is the patient using a wheelchair?: Yes Type of Wheelchair: Manual    Wheelchair assist level: Independent Max wheelchair distance: 150'    Wheelchair 50 feet with 2 turns activity    Assist        Assist Level: Independent   Wheelchair 150 feet activity     Assist      Assist Level: Independent   Blood pressure 136/77, pulse 66, temperature 97.6 F (36.4 C), temperature source Oral, resp. rate 18, height 6\' 4"  (1.93 m), weight 90.7 kg, SpO2 96%.  1. 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  Antithrombotics: -DVT/anticoagulation:  Pharmaceutical: Lovenox  added             -antiplatelet therapy:   3. Pain Management: Continue Oxycodone  prn. On home dose gabapentin  but will increase gabapentin  to 1200 mg TID and increase Oxy to 10-15 mg q4 hours prn.   5-21: Scheduled Tylenol  1000 mg 3 times daily, oxycodone  5 mg 3 times daily.  Reduce as needed oxycodone  to 5 to 10 mg every 4 hours as needed.  Added Robaxin 750 mg as needed.  5-22: Pain  better controlled on current regimen.  5-23: Discussed phantom pains, patient states they are intermittent and does not want additional medication treatment at this time.  5-26: Increase gabapentin  to 800/800/1000 mg for increasing phantom limb pains at nighttime.  Voltaren gel to left elbow 4 times daily--hopeful pain will improve once back is off  4. Mood/Behavior/Sleep: LCSW to follow for evaluation and support.              -antipsychotic agents: Caplyta  and Lithium .             --  trazodone  prn for chronic insomnia. --Using nightly  5. Neuropsych/cognition: This patient is capable of making decisions on his own behalf. 6. Skin/Wound Care: Continue wound VAC--can change to prevena if present in room.              --continue Vitamin C  and Zinc . Prostat added for wound healing.   5-21: Wound VAC 1 week per Dr. Lyndon Santiago (5/27); minimal output at this point--confirmed with Dr. Renie Carver, discussed removal with nursing.  7. Fluids/Electrolytes/Nutrition:  Monitor I/O. Check CMET in am.  5-21: AM labs with mild increase in creatinine; discussed p.o. fluid intakes with patient, encouraging 8 cups of fluid per day, will retest BMP 1 to 2 days  5-22: BUN, creatinine improved.  Monitor.  5-23: Appreciate dietary recommendations with supplementation with Glucerna, Juven, and Prosource for protein and wound healing.  Will monitor weights weekly and assess p.o. intakes as needed.  5/26:  Uncertain of accuracy of I/Os, will inquire with nursing today (documented excessive UO 5+L daily)--likely inaccurate.. Increase PO fluid intakes--discussed with patient.  Recheck BMP in 2 days.  If continues to uptrend, may need to cut back on gabapentin .  5-27: Labs in a.m.  8.  T2DM: Hgb A1c-7.7  (improved from 10.9 in Feb '25)             --monitor BS ac/hs and use SSI for elevated BS.             --continue Insulin  glargine 40 units/HS and novolog  4 units tid ac.    5-21: Blood sugars generally well-controlled on  current regimen.   5-23: Sugars mildly elevated this a.m., had a low in the 90s yesterday so we will trend today and if ongoing greater than 150 will adjust glargine--increased to 43 units at bedtime 5/26: highs consistently at bedtime, likely undercoverage of dinner; increase dinner standing to 6 U 5-27: Looking better controlled today.  Monitor on current regimen.  Monitor for 24 hours. Recent Labs    10/27/23 1113 10/27/23 1734 10/27/23 2041  GLUCAP 211* 151* 171*     9. HTN: Monitor BP tid. Not on altace  at this time.   - normotensive, monitor    10/27/2023    8:48 PM 10/27/2023    1:43 PM 10/27/2023    5:23 AM  Vitals with BMI  Systolic 136 115 846  Diastolic 77 66 67  Pulse 66 71 61    10.  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 PUD/Candida esophagitis hx: Recent episode of gastroenteritis. Dr. Willy Harvest  12. Bipolar d/o/GAD w/recent severe depressive episode: On Effexor , Adderall, Clonazepam , Gabapentin  and Caplyta .              --Lithium  added May 15th by Dr. [facility]. Continue  13. ADHD: continue Adderall 30 mg bid (increased 10/05/23  for activation)  14. B12 deficiency?:Will check level in am.  377, normal.  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 Constipation- LBM 6 days ago- will increase bowel meds  Last bowel movement 5-21  5-23: Patient endorsing multiple bowel movements from formed to loose this a.m.  DC scheduled MiraLAX  continue Colace 100 mg twice daily.  5/25: LBM --May need sorbitol if no bowel movement today  17. Cough with low grade fever- will get CXR 1 view-resolved  Patient complains of ongoing cough, minimal production, no fevers or chills.  Repeat chest x-ray negative.  Continue daily Claritin 10 mg and Flonase 1 spray bilateral nares daily.    - 5/26:  Essentially resolved  18. Bradycardia: medications reviewed and he is not on any rate controlling agents  - Can be related to oxycodone  use, wean as tolerated, however no  appreciable bradycardia in the last 24 hours.  19. Left elbow pain: XR ordered--most consistent with medial epicondylitis - 5/26: Xray with chronic calcifications of triceps insertion at olecranon + common extensor tendon at radial epicondyle - Consider MRI vs. OP orthopedics referral - Add Voltaren  gel 4 times daily--stable, continue for anti-inflammatory effect  20. Left 5th digit impaired adduction: discussed could be 2/2 ulnar nerve compression at the elbow  21. AKI 5/26: BUN/creatinine elevated.  Uncertain of accuracy of I/Os, will inquire with nursing today (documented excessive UO 5+L daily)--Per nursing, outputs likely inaccurate. -   Increase PO intakes. No diuretics. Recheck in 2 days.   LOS: 8 days A FACE TO FACE EVALUATION WAS PERFORMED  Bea Lime 10/28/2023, 12:05 AM

## 2023-10-28 NOTE — Progress Notes (Signed)
 Occupational Therapy Session Note  Patient Details  Name: [name] MRN: [id number] Date of Birth: 1966/03/23  Session 1 Today's Date: 10/28/2023 OT Individual Time: 4098-1191 OT Individual Time Calculation (min): 55 min   Session 2  Today's Date: 10/28/2023 OT Individual Time: 4782-9562 OT Individual Time Calculation (min): 81 min     Short Term Goals: Week 1:  OT Short Term Goal 1 (Week 1): STG= LTG d/t ELOS  Skilled Therapeutic Interventions/Progress Updates:    Session 1 Pt received supine with no c/o pain, agreeable to OT session.  Wound vac off and pt doing a great job of LLE desensitization. Provided/reinforced education on benefits and shrinker wear. He came to EOB with mod I. Ambulatory transfer with the RW into the bathroom with close (S). Cueing for safety when transferring to TTB. Set up both TTB and BSC over toilet to simulate home environment as accurately as possible. He doffed all clothing with close (S). He completed UB bathing with set up assist. LB with good safety awareness- cueing for grab bar use (reports he will have at home) in standing with CGA for safety. He returned to the w/c following. He donned a shirt with mod I. He shaved with set up assist. He then gathered items in his room for the laundry and propelled with mod I to the laundry room. Sit > stand at the washing machine with (S) to load clothes. Next, Pt completes 2x1 min beach ball volley in seated position with 5 # dowel rod for dynamic balance, postural control, BUE strengthening and endurance required for BADLs and functional transfers. He returned to his room and was left supine with all needs met.    Session 2 Pt received supine with 6-7/10 pain in his residual limb, premedicated. Stand pivot to the w/c with (S). He first moved laundry into dryer with (S) overall. Good positioning overall. He then propelled the w/c to the therapy gym with mod I. He was placed into the [facility] harness for  safety and to allow increase balance challenge/demands. He completed dynamic throwing/catching with a ball, taking hands off the RW. He was able to complete 1-4 throws at a time with large LOB's that he required fast response on the RW. The sling provided support as well in what would have been full LOB's. This allowed him to challenge righting reactions and trunk stability with large perturbations. Multiple trials completed, first 3 trials were with more difficulty compared to yesterday, only able to do 1-2 throws, final 3 were more successful with 2-4 throws at a time. Extended rest breaks required. Pt was able to briefly talk with [facility]anger orthotist to discuss timeline to prosthetic. Pt completed various core stabilization exercises EOM, with focus on managing intraabdominal pressure, core strengthening, and coordination to challenge dynamic sitting balance, improved stability in standing, and ultimately reduced fall risk during ADLs. Skilled cueing required for proper muscle engagement, as well as grading of resistance to provide appropriate difficulty level. He ended with theraband tricep extensions and scapular retractions for improved UE support on the RW. 2x10 repetitions with a heavy, level 3 resistance band. He returned to his room and was left supine with all needs met.    Therapy Documentation Precautions:  Precautions Precautions: Fall Precaution/Restrictions Comments: LLE wound vac Restrictions Weight Bearing Restrictions Per Provider Order: No LLE Weight Bearing Per Provider Order: Weight bearing as tolerated Therapy/Group: Individual Therapy  Una Ganser 10/28/2023, 9:06 AM

## 2023-10-28 NOTE — Progress Notes (Signed)
 Physical Therapy Session Note  Patient Details  Name: [name] MRN: [id number] Date of Birth: 1965/10/19  Today's Date: 10/28/2023 PT Individual Time: 0804-0859 PT Individual Time Calculation (min): 55 min   Short Term Goals: Week 1:  PT Short Term Goal 1 (Week 1): STGs = LTGs  Skilled Therapeutic Interventions/Progress Updates:     Pt received semi reclined in bed and agrees to therapy. No complaint of pain. Pt completes bed mobility independently. Squat pivot transfer from bed to [facility] with cues for positioning and body mechanics for safety. Pt self propels WC to gym with BUEs for endurance and mobility training. Squat pivot to mat with same cues and assistance. Pt performs activities on mat for core strengthening and balance training. Pt completes sit ups with bosu ball behind shoulders to provide support, with pt holding onto 6lb medicine ball and performing oblique twist at top of each repetition. Pt completes 3x11 with rest breaks. Pt then reclines on bosu ball and performs chest press ball toss with medicine ball 3x20 working on power and NM control in upper extremities. Pt then completes 3x15 sit to squat transitions with emphasis on controlling balance at top of each rep, and then eccentrically controlling downward motion while maintaining balance. Pt tends to shift weight posteriorly and requires minA to promote optimal weight shifting. Pt self propels WC back to room. Left seated with alarm intact and all needs within reach.    Therapy Documentation Precautions:  Precautions Precautions: Fall Precaution/Restrictions Comments: LLE wound vac Restrictions Weight Bearing Restrictions Per Provider Order: No LLE Weight Bearing Per Provider Order: Weight bearing as tolerated    Therapy/Group: Individual Therapy  Neva Barban, PT, DPT 10/28/2023, 5:08 PM

## 2023-10-29 ENCOUNTER — Other Ambulatory Visit (HOSPITAL_COMMUNITY): Payer: Self-pay

## 2023-10-29 LAB — BASIC METABOLIC PANEL WITH GFR
Anion gap: 5 (ref 5–15)
BUN: 28 mg/dL — ABNORMAL HIGH (ref 6–20)
CO2: 29 mmol/L (ref 22–32)
Calcium: 9.6 mg/dL (ref 8.9–10.3)
Chloride: 106 mmol/L (ref 98–111)
Creatinine, Ser: 1.28 mg/dL — ABNORMAL HIGH (ref 0.61–1.24)
GFR, Estimated: >60 mL/min (ref >60)
Glucose, Bld: 120 mg/dL — ABNORMAL HIGH (ref 70–99)
Potassium: 4.8 mmol/L (ref 3.5–5.1)
Sodium: 140 mmol/L (ref 135–145)

## 2023-10-29 LAB — GLUCOSE, CAPILLARY
Glucose-Capillary: 103 mg/dL — ABNORMAL HIGH (ref 70–99)
Glucose-Capillary: 135 mg/dL — ABNORMAL HIGH (ref 70–99)
Glucose-Capillary: 104 mg/dL — ABNORMAL HIGH (ref 70–99)
Glucose-Capillary: 167 mg/dL — ABNORMAL HIGH (ref 70–99)

## 2023-10-29 LAB — CBC
HCT: 35.6 % — ABNORMAL LOW (ref 39.0–52.0)
Hemoglobin: 11.9 g/dL — ABNORMAL LOW (ref 13.0–17.0)
MCH: 29.1 pg (ref 26.0–34.0)
MCHC: 33.4 g/dL (ref 30.0–36.0)
MCV: 87 fL (ref 80.0–100.0)
Platelets: 404 10*3/uL — ABNORMAL HIGH (ref 150–400)
RBC: 4.09 MIL/uL — ABNORMAL LOW (ref 4.22–5.81)
RDW: 13.5 % (ref 11.5–15.5)
WBC: 7.7 10*3/uL (ref 4.0–10.5)
nRBC: 0 % (ref 0.0–0.2)

## 2023-10-29 MED ORDER — BENEPROTEIN PO POWD: 1.0000 | Freq: Three times a day (TID) | ORAL | Status: AC

## 2023-10-29 MED ORDER — FLUTICASONE PROPIONATE 50 MCG/ACT NA SUSP
1.0000 | Freq: Every day | NASAL | 0 refills | Status: AC
  Filled 2023-10-29: qty 16, 60d supply, fill #0

## 2023-10-29 MED ORDER — ADULT MULTIVITAMIN W/MINERALS CH: 1.0000 | ORAL_TABLET | Freq: Every day | ORAL | Status: AC

## 2023-10-29 MED ORDER — GABAPENTIN 100 MG PO CAPS
100.0000 mg | ORAL_CAPSULE | Freq: Every day | ORAL | 0 refills | Status: DC
  Filled 2023-10-29: qty 30, 30d supply, fill #0

## 2023-10-29 MED ORDER — FENOFIBRATE 160 MG PO TABS
160.0000 mg | ORAL_TABLET | Freq: Every day | ORAL | 0 refills | Status: AC
  Filled 2023-10-29: qty 30, 30d supply, fill #0

## 2023-10-29 MED ORDER — INSULIN GLARGINE 100 UNIT/ML SOLOSTAR PEN: 45.0000 [IU] | PEN_INJECTOR | Freq: Every day | SUBCUTANEOUS | Status: AC

## 2023-10-29 MED ORDER — OXYCODONE HCL 5 MG PO TABS
5.0000 mg | ORAL_TABLET | Freq: Four times a day (QID) | ORAL | 0 refills | Status: DC | PRN
  Filled 2023-10-29: qty 20, 5d supply, fill #0

## 2023-10-29 MED ORDER — PANTOPRAZOLE SODIUM 40 MG PO TBEC
40.0000 mg | DELAYED_RELEASE_TABLET | Freq: Every day | ORAL | 0 refills | Status: AC
  Filled 2023-10-29: qty 30, 30d supply, fill #0

## 2023-10-29 MED ORDER — ASCORBIC ACID 1000 MG PO TABS: 1000.0000 mg | ORAL_TABLET | Freq: Every day | ORAL | Status: AC

## 2023-10-29 MED ORDER — FLUTICASONE PROPIONATE 50 MCG/ACT NA SUSP
1.0000 | Freq: Every day | NASAL | 0 refills | Status: DC
  Filled 2023-10-29: qty 11.1, fill #0

## 2023-10-29 MED ORDER — DICLOFENAC SODIUM 1 % EX GEL
2.0000 g | Freq: Four times a day (QID) | CUTANEOUS | 0 refills | Status: AC
  Filled 2023-10-29: qty 100, 13d supply, fill #0

## 2023-10-29 MED ORDER — ACETAMINOPHEN 500 MG PO TABS
1000.0000 mg | ORAL_TABLET | Freq: Three times a day (TID) | ORAL | 0 refills | Status: AC
  Filled 2023-10-29: qty 30, 5d supply, fill #0

## 2023-10-29 MED ORDER — ZINC SULFATE 220 (50 ZN) MG PO TABS
220.0000 mg | ORAL_TABLET | Freq: Every day | ORAL | 0 refills | Status: AC
  Filled 2023-10-29: qty 30, 30d supply, fill #0

## 2023-10-29 MED ORDER — ZINC SULFATE 220 (50 ZN) MG PO CAPS
220.0000 mg | ORAL_CAPSULE | Freq: Every day | ORAL | Status: DC
  Administered 2023-10-30 – 2023-10-31 (×2): 220 mg via ORAL
  Filled 2023-10-29 (×2): qty 1

## 2023-10-29 MED ORDER — LORATADINE 10 MG PO TABS
10.0000 mg | ORAL_TABLET | Freq: Every day | ORAL | 0 refills | Status: AC
  Filled 2023-10-29: qty 30, 30d supply, fill #0

## 2023-10-29 NOTE — Progress Notes (Signed)
 Physical Therapy Session Note  Patient Details  Name: [name] MRN: [id number] Date of Birth: 1965/09/16  Today's Date: 10/29/2023 PT Individual Time: 1000-1030 PT Individual Time Calculation (min): 30 min   Short Term Goals: Week 1:  PT Short Term Goal 1 (Week 1): STGs = LTGs  Skilled Therapeutic Interventions/Progress Updates:    pt received in bed and agreeable to therapy. Pt reports "normal" pain levels, no intervention required. Supervision squat pivot to pt's personal w/c. Therapist made adjustments and set up w/c. Pt propelled w/c with BUE for endurance and functional mobility. Provided cues to reduce friction during propulsion for improved efficiency. Pt then propelled up/down ramp x 2 for community mobility. Discussed car transfer, pt stating he lives alone so he anticipates return to driving quickly. Discussed ways he might safely load w/c into vehicle. Pt states he plans to sit in open trunk of hatch back and load chair from there. Therapist advised pt to not try this without supervision, pt agrees. Pt then performed x 20 reps of retrieving 25lb kettle bell from 4 " step to mimic motion and work towards weight of w/c and build core strength for safety lifting heavy objects from outside BOS. Pt returned to room and remained in w/c, was left with all needs in reach and alarm active.   Therapy Documentation Precautions:  Precautions Precautions: Fall Precaution/Restrictions Comments: LLE wound vac Restrictions Weight Bearing Restrictions Per Provider Order: No LLE Weight Bearing Per Provider Order: Non weight bearing General:       Therapy/Group: Individual Therapy  Tex Filbert 10/29/2023, 1:44 PM

## 2023-10-29 NOTE — Evaluation (Signed)
 Recreational Therapy Assessment and Plan  Patient Details  Name: [name] MRN: [id number] Date of Birth: 07/11/1965 Today's Date: 10/29/2023  Rehab Potential:  Good ELOS:   d/c 5/31  Assessment Hospital Problem: Principal Problem:   S/P BKA (below knee amputation) unilateral, left (HCC) Active Problems:   Osteomyelitis of left foot (HCC)     Past Medical History:      Past Medical History:  Diagnosis Date   ADHD (attention deficit hyperactivity disorder)     Anxiety and depression     Candida esophagitis (HCC)      in setting of DKA/hyperglycemia   CIDP (chronic inflammatory demyelinating polyneuropathy) (HCC)     Depression     Diabetes type 2, controlled (HCC)     Diverticulosis     Essential hypertension 06/21/2014   Hyperlipidemia     Kidney stones     Pancreatitis     Stomach ulcer     Vitamin D deficiency          Past Surgical History:       Past Surgical History:  Procedure Laterality Date   AMPUTATION Left 10/16/2023    Procedure: LEFT BELOW KNEE AMPUTATION;  Surgeon: Timothy Ford, MD;  Location: [facility] OR;  Service: Orthopedics;  Laterality: Left;  LEFT BELOW KNEE AMPUTATION   ANKLE SURGERY       APPLICATION OF WOUND VAC Left 10/16/2023    Procedure: APPLICATION, WOUND VAC;  Surgeon: Timothy Ford, MD;  Location: [facility] OR;  Service: Orthopedics;  Laterality: Left;   CERVICAL DISC SURGERY        C4-C5   ELBOW SURGERY       ESOPHAGOGASTRODUODENOSCOPY N/A 07/16/2017    Procedure: ESOPHAGOGASTRODUODENOSCOPY (EGD);  Surgeon: Kenney Peacemaker, MD;  Location: [facility] ENDOSCOPY;  Service: Endoscopy;  Laterality: N/A;   IRRIGATION AND DEBRIDEMENT FOOT Left 07/31/2023    Procedure: IRRIGATION AND DEBRIDEMENT FOOT AND FIFTH MET RESCTION;  Surgeon: Evertt Hoe, DPM;  Location: [facility] OR;  Service: Orthopedics/Podiatry;  Laterality: Left;   RETINAL DETACHMENT SURGERY       RHINOPLASTY       VASECTOMY       WOUND DEBRIDEMENT Left 03/27/2018    Procedure:  DEBRIDEMENT WOUND;  Surgeon: Charity Conch, DPM;  Location: [facility] OR;  Service: Podiatry;  Laterality: Left;          Assessment & Plan Clinical Impression: Patient is a 58 year old  R handed male with history of T2DM, CIDP, 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 Demetria Finch), IV antibiotics and hyperbaric tx ([facility]). He has had progressive breakdown ulceration to the bone and pain left knee. He was evaluated by Dr. Julio Ohm who recommended amputation. [name] for L-BKA and has been participating in PT/OT.  He lives alone and was independent PTA with use of scooter due to NWB LLE. He currently requires min to CGA and CIR recommended due to functional decline.    Pt reports has been on gabapentin  for "years" and just not enough right now- taking Oxy 2-3x/day, and usually taking 10 mg.  Been having low grade temps- was 102.8 the 'other' day- but Is concerned because having more of a cough. Pain 7-8/10 at rest since block has worn off- but spikes to "10+/10" at time with electrical lightening that goes through his L residual limb. Says  his mood is MUCH better since they added Lithium - feels like it's made the most difference than anything in his whole life- "wished he had it 30-40 years ago".     Patient transferred to CIR on 10/20/2023 .    Pt presents with decreased activity tolerance, decreased functional mobility, decreased balance Limiting pt's independence with leisure/community pursuits.  Met with pt today to discuss leisure education, activity analysis/modifications and stress management.  Also discussed the importance of social, emotional, spiritual health in addition to physical health and their effects on overall health and wellness.  Pt stated understanding and feels prepared for upcoming discharge.   Plan  No further TR  Recommendations for other  services: None   Discharge Criteria: Patient will be discharged from TR if patient refuses treatment 3 consecutive times without medical reason.  If treatment goals not met, if there is a change in medical status, if patient makes no progress towards goals or if patient is discharged from hospital.  The above assessment, treatment plan, treatment alternatives and goals were discussed and mutually agreed upon: by patient  [name] 10/29/2023, 3:40 PM

## 2023-10-29 NOTE — Progress Notes (Signed)
 PROGRESS NOTE   Subjective/Complaints:  Some elevated blood pressure in the 150s overnight, otherwise vitally stable. No events overnight.  Complaining of 6 out of 10 leg pain. Blood sugars well-controlled A.m. labs significant for AKI with increasing BUN 28, creatinine 1.28 (ratio 23).  Mild thrombocytosis 404.  Hemoglobin, WBC stable.  ROS: Denies fevers, chills, N/V, abdominal pain, constipation, diarrhea, SOB, cough, chest pain, new weakness or paraesthesias.   + Pain over surgical site--stable + phantom pain--worsening + left elbow pain--stable + Left ear pain-resolved  Objective:   No results found.   Recent Labs    10/29/23 0708  WBC 7.7  HGB 11.9*  HCT 35.6*  PLT 404*    Recent Labs    10/28/23 0515 10/29/23 0708  NA 136 140  K 3.9 4.8  CL 105 106  CO2 26 29  GLUCOSE 210* 120*  BUN 25* 28*  CREATININE 1.21 1.28*  CALCIUM  9.1 9.6     Intake/Output Summary (Last 24 hours) at 10/29/2023 1019 Last data filed at 10/29/2023 0837 Gross per 24 hour  Intake 956 ml  Output 6300 ml  Net -5344 ml        Physical Exam: Vital Signs Blood pressure 128/81, pulse 60, temperature 98 F (36.7 C), resp. rate 18, height 6\' 4"  (1.93 m), weight 97.3 kg, SpO2 96%. Constitutional: No apparent distress. Appropriate appearance for age. Reclining in bed.  HENT: No JVD. Neck Supple. Trachea midline. Atraumatic, normocephalic. Eyes: PERRLA. EOMI. Visual fields grossly intact.  Cardiovascular: Bradycardic. Peripheral pulses 2+  Respiratory: CTAB. No rales, rhonchi, or wheezing. On RA.  Abdomen: + bowel sounds, normoactive. No distention or tenderness.  Skin: C/D/I. Staples intact.   MSK:      Positive left BKA, minimal edema, well-formed. +shrinker, limb gaurd       Strength: 5 out of 5 in all 4 extremities  Neurologic exam:  Cognition: AAO to person, place, time and event.  Language: Fluent, No substitutions or  neoglisms. No dysarthria.  Memory: No apparent deficits. Insight: Good insight into current condition.  Mood: Moderate anxiety, pleasant mood and affect.   Sensation: Equal and intact in BL UE and Les that a stocking glove pattern from the right mid calf into the toes       Assessment/Plan: 1. Functional deficits which require 3+ hours per day of interdisciplinary therapy in a comprehensive inpatient rehab setting. Physiatrist is providing close team supervision and 24 hour management of active medical problems listed below. Physiatrist and rehab team continue to assess barriers to discharge/monitor patient progress toward functional and medical goals  Care Tool:  Bathing    Body parts bathed by patient: Right arm, Left arm, Abdomen, Front perineal area, Chest, Buttocks, Right upper leg, Left upper leg, Right lower leg, Face, Left lower leg         Bathing assist Assist Level: Supervision/Verbal cueing     Upper Body Dressing/Undressing Upper body dressing   What is the patient wearing?: Pull over shirt    Upper body assist Assist Level: Supervision/Verbal cueing    Lower Body Dressing/Undressing Lower body dressing      What is the patient wearing?: Ace wrap/stump shrinker,  Underwear/pull up, Pants     Lower body assist Assist for lower body dressing: Supervision/Verbal cueing     Toileting Toileting    Toileting assist Assist for toileting: Supervision/Verbal cueing     Transfers Chair/bed transfer  Transfers assist     Chair/bed transfer assist level: Supervision/Verbal cueing     Locomotion Ambulation   Ambulation assist      Assist level: Contact Guard/Touching assist Assistive device: Walker-rolling Max distance: 175'   Walk 10 feet activity   Assist     Assist level: Contact Guard/Touching assist Assistive device: Walker-rolling   Walk 50 feet activity   Assist    Assist level: Contact Guard/Touching assist Assistive device:  Walker-rolling    Walk 150 feet activity   Assist Walk 150 feet activity did not occur: Safety/medical concerns  Assist level: Contact Guard/Touching assist Assistive device: Walker-rolling    Walk 10 feet on uneven surface  activity   Assist     Assist level: Minimal Assistance - Patient > 75% Assistive device: Walker-rolling   Wheelchair     Assist Is the patient using a wheelchair?: Yes Type of Wheelchair: Manual    Wheelchair assist level: Independent Max wheelchair distance: 150'    Wheelchair 50 feet with 2 turns activity    Assist        Assist Level: Independent   Wheelchair 150 feet activity     Assist      Assist Level: Independent   Blood pressure 128/81, pulse 60, temperature 98 F (36.7 C), resp. rate 18, height 6\' 4"  (1.93 m), weight 97.3 kg, SpO2 96%.  1. 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  Antithrombotics: -DVT/anticoagulation:  Pharmaceutical: Lovenox  added             -antiplatelet therapy:   3. Pain Management: Continue Oxycodone  prn. On home dose gabapentin  but will increase gabapentin  to 1200 mg TID and increase Oxy to 10-15 mg q4 hours prn.   5-21: Scheduled Tylenol  1000 mg 3 times daily, oxycodone  5 mg 3 times daily.  Reduce as needed oxycodone  to 5 to 10 mg every 4 hours as needed.  Added Robaxin 750 mg as needed.  5-22: Pain better controlled on current regimen.  5-23: Discussed phantom pains, patient states they are intermittent and does not want additional medication treatment at this time.  5-26: Increase gabapentin  to 800/800/1000 mg for increasing phantom limb pains at nighttime.  Voltaren gel to left elbow 4 times daily--hopeful  pain will improve once back is off  5/29: reduce gabapentin  back to home 800 mg TID d/t elevated BUN  4. Mood/Behavior/Sleep: LCSW to follow for evaluation and support.              -antipsychotic agents: Caplyta  and Lithium .             --trazodone  prn for chronic insomnia. --Using nightly  5. Neuropsych/cognition: This patient is capable of making decisions on his own behalf. 6. Skin/Wound Care: Continue wound VAC--can change to prevena if present in room.              --  continue Vitamin C  and Zinc . Prostat added for wound healing.   5-21: Wound VAC 1 week per Dr. Lyndon Santiago (5/27); minimal output at this point--wound VAC removed 5-28  7. Fluids/Electrolytes/Nutrition:  Monitor I/O. Check CMET in am.  5-21: AM labs with mild increase in creatinine; discussed p.o. fluid intakes with patient, encouraging 8 cups of fluid per day, will retest BMP 1 to 2 days  5-22: BUN, creatinine improved.  Monitor.  5-23: Appreciate dietary recommendations with supplementation with Glucerna, Juven, and Prosource for protein and wound healing.  Will monitor weights weekly and assess p.o. intakes as needed.  5/26:  Uncertain of accuracy of I/Os, will inquire with nursing today (documented excessive UO 5+L daily)--likely inaccurate.. Increase PO fluid intakes--discussed with patient.  Recheck BMP in 2 days.  If continues to uptrend, may need to cut back on gabapentin .  5-29: Nutrition increasing protein supplements, vitamin C , zinc  for wound healing  8.  T2DM: Hgb A1c-7.7  (improved from 10.9 in Feb '25)             --monitor BS ac/hs and use SSI for elevated BS.             --continue Insulin  glargine 40 units/HS and novolog  4 units tid ac.    5-21: Blood sugars generally well-controlled on current regimen.   5-23: Sugars mildly elevated this a.m., had a low in the 90s yesterday so we will trend today and if ongoing greater than 150 will adjust glargine--increased to 43 units at bedtime 5/26: highs consistently at  bedtime, likely undercoverage of dinner; increase dinner standing to 6 U 5-27: Looking better controlled --  Monitor on current regimen--May increase long-acting in 1 to 2 days if no lows 5/29: well controlled  Monitor for 24 hours. Recent Labs    10/28/23 1641 10/28/23 2123 10/29/23 0600  GLUCAP 144* 147* 103*     9. HTN: Monitor BP tid. Not on altace  at this time.   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 PUD/Candida esophagitis hx: Recent episode of gastroenteritis. Dr. Willy Harvest   - No s/s GIB; Hgb stable; monitor BUN:Cr   12. Bipolar d/o/GAD w/recent severe depressive episode: On Effexor , Adderall, Clonazepam , Gabapentin  and Caplyta .              --Lithium  added May 15th by Dr. Lan[facility]. Continue  13. ADHD: continue Adderall 30 mg bid (increased 10/05/23  for activation)  14. B12 deficiency?:Will check level in am.  377, normal.  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 Constipation- LBM 6 days ago- will increase bowel meds  Last bowel movement 5-21  5-23: Patient endorsing multiple bowel movements from formed to loose this a.m.  DC scheduled MiraLAX  continue Colace 100 mg twice daily.  5/25: LBM --May need sorbitol if no bowel movement today  5-28: Large bowel movement   17. Cough with low grade fever- will get CXR 1 view-resolved  Patient complains of ongoing cough, minimal production, no fevers or chills.  Repeat chest x-ray negative.  Continue daily Claritin 10 mg and Flonase 1 spray bilateral nares daily.    - resolved  18. Bradycardia: medications reviewed and he is not on any rate controlling agents  -  Can be related to oxycodone  use, wean as tolerated, however no appreciable bradycardia in the last 24 hours.  - Asymptomatic, has not recurred, monitor  19. Left elbow  pain: XR ordered--most consistent with medial epicondylitis - 5/26: Xray with chronic calcifications of triceps insertion at olecranon + common extensor tendon at radial epicondyle - Consider MRI vs. OP orthopedics referral - Add Voltaren  gel 4 times daily--stable, continue for anti-inflammatory effect  20. Left 5th digit impaired adduction: discussed could be 2/2 ulnar nerve compression at the elbow   - May need OP EMG for workup  21. AKI 5/26: BUN/creatinine elevated.  Uncertain of accuracy of I/Os, will inquire with nursing today (documented excessive UO 5+L daily)--Per nursing, outputs likely inaccurate. -   Increase PO intakes. No diuretics. Recheck in 2 days. - 5-28: Creatinine normalized, BUN remains elevated.  May be complicated by protein intake and wound healing.  Monitor closely. - 5/29: BUN/Cr 23:1; Cr stable, BUN uptrending. Encourage PO flui. Reduce gabapentin  as above. Repeat BMP as OP at follow up  22.  Left ear pain.  Self-limited, some changes from ongoing issues with allergies but nothing concerning for infection.  Monitor for now.  - 5/29: resolved LOS: 9 days A FACE TO FACE EVALUATION WAS PERFORMED  Bea Lime 10/29/2023, 10:19 AM

## 2023-10-29 NOTE — Progress Notes (Incomplete)
 Physical Therapy Session Note  Patient Details  Name: [name] MRN: [id number] Date of Birth: Oct 21, 1965  Today's Date: 10/29/2023 PT Individual Time: 4098-1191 PT Individual Time Calculation (min): 41 min   Today's Date: 10/29/2023 PT Individual Time: 4782-9562 PT Individual Time Calculation (min): 71 min   Short Term Goals: Week 1:  PT Short Term Goal 1 (Week 1): STGs = LTGs  Skilled Therapeutic Interventions/Progress Updates:     1st Session: Pt received semi reclined in bed and agrees to therapy. Reports pain in LLE. Number not provided. PT provides rest breaks as needed to manage pain. Pt dons limb protector with setup assistance. Bed mobility independent. Squat pivot to WC with PT stabilizing WC and providing cues for body mechanics. Pt brushes teeth prior to leaving room to work on UE coordination. Pt self propels WC from room to [employer], navigatin elevators and public places for community reintegration and mobility training. PT provides cues for management of WC, as well as recommendations for safe mobility. Pt propels WC >1000', including outside over unlevel and varying surfaces. Pt left seated in WC with alarm intact and all needs within reach.   2nd Session: Pt received supine in bed and agrees to therapy. No complaint of pain. Supine to sit independently. Pt performs squat pivot to [facility] with cues for sequencing and safety. Pt self propels WC x200' to gym with BUEs to work endurance and mobility training. Squat pivot to Nustep with cues for safe WC management. Pt completes Nustep for endurance training with BUEs and RLE. Pt completes x10:00 on rolling hills function between workload of 4 and 8. PT provides cues for hand and foot placement and completing full available ROM.   Pt completes 6 minute WC propulsion test to provide endurance challenge. Pt self propels x1100' with cues for efficient propulsion technique. Pt then performs squat pivot to mat table with cues for  positioning.   Pt performs UE exercises for shoulder and back NMR and strengthening to prevent overuse injuries of anterior muscle groups. Pt performs shoulder external rotations with BUEs and green theraband for resistance, scapular retractions with 5 second hold, triceps extensions with green theraband.  Pt performs squat pivot from mat>WC>bed with cues for positioning. Left with all needs within reach.   Therapy Documentation Precautions:  Precautions Precautions: Fall Precaution/Restrictions Comments: LLE wound vac Restrictions Weight Bearing Restrictions Per Provider Order: No LLE Weight Bearing Per Provider Order: Non weight bearing   Therapy/Group: Individual Therapy  Neva Barban, PT, DPT 10/29/2023, 4:44 PM

## 2023-10-29 NOTE — Progress Notes (Signed)
 Nutrition Follow-up  DOCUMENTATION CODES:   Non-severe (moderate) malnutrition in context of social or environmental circumstances  INTERVENTION:   Glucerna Shake po BID, each supplement provides 220 kcal and 10 grams of protein. Prosource Plus 30 ml PO BID, each packet provides 100 kcal and 15 gm protein. Juven 1 packet PO BID, each packet provides 80 calories, 8 grams of carbohydrate, 2.5  grams of protein (collagen), 7 grams of L-arginine and 7 grams of L-glutamine; supplement contains CaHMB, Vitamins C, E, B12 and Zinc  to promote wound healing. MVI with minerals daily. Continue vitamin C  1,000 mg daily. Continue zinc  sulfate 220 mg daily x 14 days.  NUTRITION DIAGNOSIS:   Moderate Malnutrition related to social / environmental circumstances (depression, lives alone) as evidenced by mild muscle depletion, moderate muscle depletion, mild fat depletion, moderate fat depletion.  Ongoing   GOAL:   Patient will meet greater than or equal to 90% of their needs  Met   MONITOR:   PO intake, Supplement acceptance, Skin  REASON FOR ASSESSMENT:   Consult Diet education  ASSESSMENT:   58 yo male admitted with functional deficits d/t L BKA. PMH includes DM-2, ADHD, HLD, stomach ulcer, pancreatitis, anxiety, depression, chronic inflammatory demyelinating polyneuropathy, vitamin D deficiency, HTN.  Patient remains on a carb modified diet. Meal intakes documented at 100%. He is taking Juven BID, Prosource Plus BID, vitamin C  1,000 mg daily, and zinc  sulfate 220 mg daily to promote wound healing.   Labs reviewed.  CBG: 103-135  Medications reviewed and include vitamin C , colace, novolog , semglee , MVI with minerals, juven, zinc  sulfate.  Admit weight 90.7 kg Current weight 97.3 kg   Diet Order:   Diet Order             Diet Carb Modified Fluid consistency: Thin; Room service appropriate? Yes  Diet effective now                   EDUCATION NEEDS:   Education needs  have been addressed  Skin:  Skin Assessment: Skin Integrity Issues: Skin Integrity Issues:: Incisions Wound Vac: removed Incisions: L BKA  Last BM:  5/28 type 4  Height:   Ht Readings from Last 1 Encounters:  10/21/23 6\' 4"  (1.93 m)    Weight:   Wt Readings from Last 1 Encounters:  10/28/23 97.3 kg    Ideal Body Weight:  85.9 kg (adjusted for L BKA)  BMI:  Body mass index is 26.11 kg/m.  Estimated Nutritional Needs:   Kcal:  2400-2600  Protein:  130-160 g  Fluid:  >/= 2.4 L   Barnet Boots RD, LDN, CNSC Contact via secure chat. If unavailable, use group chat "RD Inpatient."

## 2023-10-29 NOTE — Progress Notes (Signed)
 Occupational Therapy Session Note  Patient Details  Name: [name] MRN: [id number] Date of Birth: [birth year]  Today's Date: 10/29/2023 OT Individual Time: 1100-1155 OT Individual Time Calculation (min): 55 min    Short Term Goals: Week 1:  OT Short Term Goal 1 (Week 1): STG= LTG d/t ELOS  Skilled Therapeutic Interventions/Progress Updates:    Pt resting in bed upon arrival. Skilled OT services with focus on squat pivot transfers, w/c moblity, BUE therex for strengthening, discharge planning, and safety awareness to increase independence with BADLs and prepare for d/c 5/31. Squat pivot transfer bed>w/c with supervision. All w/c mobility and w/c mgmt throughout session with supervision. BUE therex on UBE 5 mins level 4 and 10 mins levels 2-12 (hills). BUE therex with green theraband for BUE strengthening. Pt return demonstrated exercises from previous therapy sessions. Pt returned to room and transferred back to EOB. Pt failed to lock Rt brake and w/c shifted. Reviewed w/c safety. Block practice transfers to reenforce w/c safety. Pt engaged in LLE desensitization for phantom pain. Pt remained in bed with all needs within reach. Bed alarm activated.   Therapy Documentation Precautions:  Precautions Precautions: Fall Precaution/Restrictions Comments: LLE wound vac Restrictions Weight Bearing Restrictions Per Provider Order: No LLE Weight Bearing Per Provider Order: Non weight bearing   Pain: Pt reports 6/10 LLE phantom pain; desensitization and repositioning   Therapy/Group: Individual Therapy  Doak Free 10/29/2023, 12:09 PM

## 2023-10-30 ENCOUNTER — Other Ambulatory Visit (HOSPITAL_COMMUNITY): Payer: Self-pay

## 2023-10-30 LAB — GLUCOSE, CAPILLARY
Glucose-Capillary: 144 mg/dL — ABNORMAL HIGH (ref 70–99)
Glucose-Capillary: 171 mg/dL — ABNORMAL HIGH (ref 70–99)
Glucose-Capillary: 151 mg/dL — ABNORMAL HIGH (ref 70–99)
Glucose-Capillary: 255 mg/dL — ABNORMAL HIGH (ref 70–99)

## 2023-10-30 LAB — BASIC METABOLIC PANEL WITH GFR
Anion gap: 5 (ref 5–15)
BUN: 28 mg/dL — ABNORMAL HIGH (ref 6–20)
CO2: 28 mmol/L (ref 22–32)
Calcium: 9.7 mg/dL (ref 8.9–10.3)
Chloride: 106 mmol/L (ref 98–111)
Creatinine, Ser: 1.46 mg/dL — ABNORMAL HIGH (ref 0.61–1.24)
GFR, Estimated: 55 mL/min — ABNORMAL LOW (ref >60)
Glucose, Bld: 145 mg/dL — ABNORMAL HIGH (ref 70–99)
Potassium: 5.1 mmol/L (ref 3.5–5.1)
Sodium: 139 mmol/L (ref 135–145)

## 2023-10-30 MED ORDER — METHOCARBAMOL 750 MG PO TABS
750.0000 mg | ORAL_TABLET | Freq: Four times a day (QID) | ORAL | 0 refills | Status: AC | PRN
  Filled 2023-10-30: qty 45, 12d supply, fill #0

## 2023-10-30 MED ORDER — DOCUSATE SODIUM 100 MG PO CAPS
100.0000 mg | ORAL_CAPSULE | Freq: Two times a day (BID) | ORAL | 0 refills | Status: AC
  Filled 2023-10-30: qty 10, 5d supply, fill #0

## 2023-10-30 NOTE — Progress Notes (Signed)
 Physical Therapy Discharge Summary  Patient Details  Name: [name] MRN: [id number] Date of Birth: 05-Dec-1965  Date of Discharge from PT service:Oct 30, 2023  Today's Date: 10/30/2023 PT Individual Time: 1530-1623 PT Individual Time Calculation (min): 53 min    Patient has met 9 of 9 long term goals due to improved activity tolerance, improved balance, improved postural control, and increased strength.  Patient to discharge at independent WC level and ambulatory level Modified Independent.    Reasons goals not met: NA  Recommendation:  Patient will benefit from ongoing skilled PT services in outpatient setting to continue to advance safe functional mobility, address ongoing impairments in balance, strength, endurance, ambulation, and minimize fall risk.  Equipment: 18" x 18" manual WC  Reasons for discharge: treatment goals met and discharge from hospital  Patient/family agrees with progress made and goals achieved: Yes  Skilled Therapeutic Interventions: Pt received seated in [facility] and agrees to therapy. No complaint of pain. Pt self propels WC x300' to gym with BUEs independently. Pt completes car transfer with cues for sequencing. Pt completes ramp navigation with RW and cues for posture and safe AD management. Following rest break, pt completes x12 6" steps with bilateral handrails and cues for step sequencing. Pt and PT discuss various techniques for step navigation and pt's home setup. Pt self propels to dayroom for endurance training. Pt ambulates x175' with RW at [facility]). PT and pt discuss recommendations for safe mobility following discharge. Pt ambulates to toilet with RW. Left seated on toilet with call bell in reach.   PT Discharge Precautions/Restrictions Precautions Precautions: Fall Restrictions Weight Bearing Restrictions Per Provider Order: Yes LLE Weight Bearing Per Provider Order: Non weight bearing Pain Interference Pain Interference Pain Effect on  Sleep: 2. Occasionally Pain Interference with Therapy Activities: 1. Rarely or not at all Pain Interference with Day-to-Day Activities: 2. Occasionally Vision/Perception  Vision - History Ability to See in Adequate Light: 0 Adequate Perception Perception: Within Functional Limits Praxis Praxis: WFL  Cognition Overall Cognitive Status: Within Functional Limits for tasks assessed Arousal/Alertness: Awake/alert Memory: Appears intact Awareness: Appears intact Problem Solving: Appears intact Safety/Judgment: Appears intact Sensation Sensation Light Touch: Impaired by gross assessment Additional Comments: Decreased distal sensation in d/t RLE peripheral neuropathy Coordination Gross Motor Movements are Fluid and Coordinated: Yes Fine Motor Movements are Fluid and Coordinated: Yes Finger Nose Finger Test: WNL Motor  Motor Motor: Within Functional Limits  Mobility Bed Mobility Supine to Sit: Independent Sit to Supine: Independent Transfers Transfers: Sit to Stand;Stand to Sit;Stand Pivot Transfers Sit to Stand: Independent with assistive device Stand to Sit: Independent with assistive device Stand Pivot Transfers: Independent with assistive device Transfer (Assistive device): Rolling walker Locomotion  Gait Ambulation: Yes Gait Assistance: Independent with assistive device Gait Distance (Feet): 175 Feet Assistive device: Rolling walker Gait Gait: Yes Gait Pattern: Step-to pattern Gait velocity: decr Stairs / Additional Locomotion Stairs: Yes Stairs Assistance: Supervision/Verbal cueing Stair Management Technique: Two rails Number of Stairs: 12 Height of Stairs: 6 Ramp: Supervision/Verbal cueing Curb: Air traffic controller Mobility: Yes Wheelchair Assistance: Independent with Scientist, research (life sciences): Both upper extremities Wheelchair Parts Management: Independent Distance: 300'  Trunk/Postural Assessment   Cervical Assessment Cervical Assessment: Within Functional Limits Thoracic Assessment Thoracic Assessment: Within Functional Limits Lumbar Assessment Lumbar Assessment: Within Functional Limits Postural Control Postural Control: Within Functional Limits  Balance Balance Balance Assessed: Yes Static Sitting Balance Static Sitting - Level of Assistance: 7: Independent Dynamic Sitting Balance Dynamic Sitting - Level  of Assistance: 7: Independent Cytogeneticist Standing - Balance Support: Bilateral upper extremity supported Static Standing - Level of Assistance: 6: Modified independent (Device/Increase time) Dynamic Standing Balance Dynamic Standing - Balance Support: During functional activity;Bilateral upper extremity supported Dynamic Standing - Level of Assistance: 6: Modified independent (Device/Increase time) Extremity Assessment  RLE Assessment RLE Assessment: Within Functional Limits General Strength Comments: Endurance impaired LLE Assessment LLE Assessment: Exceptions to [facility] General Strength Comments: not tested secondary to BKA   Neva Barban, PT, DPT 10/30/2023, 4:40 PM

## 2023-10-30 NOTE — Progress Notes (Signed)
 Occupational Therapy Discharge Summary  Patient Details  Name: [name] MRN: [id number] Date of Birth: 1966-02-28  Date of Discharge from OT service:Oct 30, 2023   Patient has met 8 of 8 long term goals due to improved activity tolerance, improved balance, postural control, ability to compensate for deficits, and improved coordination.  Patient to discharge at overall Modified Independent level for ADLs and (S) for IADL, which his sister will be available to help him with.  Patient's care partner is independent to provide the necessary cognitive assistance at discharge. [name] has made great progress in CIR and is incredibly motivated to continue his therapy efforts to receive a prosthetic.   Recommendation:  Patient will benefit from ongoing skilled OT services in outpatient setting to continue to advance functional skills in the area of iADL once he initiates prosthetic training.  Equipment: BSC, TTB, w/c. RW  Reasons for discharge: treatment goals met and discharge from hospital  Patient/family agrees with progress made and goals achieved: Yes  OT Discharge Precautions/Restrictions  Precautions Precautions: Fall Recall of Precautions/Restrictions: Intact Precaution/Restrictions Comments: L BKA Restrictions Weight Bearing Restrictions Per Provider Order: Yes LLE Weight Bearing Per Provider Order: Non weight bearing    ADL ADL Eating: Independent Where Assessed-Eating: Bed level Grooming: Modified independent Where Assessed-Grooming: Sitting at sink Upper Body Bathing: Modified independent Where Assessed-Upper Body Bathing: Shower Lower Body Bathing: Modified independent Where Assessed-Lower Body Bathing: Shower Upper Body Dressing: Independent Where Assessed-Upper Body Dressing: Edge of bed Lower Body Dressing: Modified independent Where Assessed-Lower Body Dressing: Standing at sink, Sitting at sink Toileting: Modified independent Where Assessed-Toileting:  Neurosurgeon Method: Surveyor, minerals: Gaffer: Modified independent Web designer Method: Engineer, technical sales: Insurance underwriter: Modified independent Film/video editor Method: Warden/ranger: Sales promotion account executive Baseline Vision/History: 0 No visual deficits Patient Visual Report: No change from baseline Vision Assessment?: No apparent visual deficits Perception  Perception: Within Functional Limits Praxis Praxis: WFL Cognition Cognition Overall Cognitive Status: Within Functional Limits for tasks assessed Arousal/Alertness: Awake/alert Memory: Appears intact Awareness: Appears intact Problem Solving: Appears intact Safety/Judgment: Appears intact Brief Interview for Mental Status (BIMS) Repetition of Three Words (First Attempt): 3 Temporal Orientation: Year: Correct Temporal Orientation: Month: Accurate within 5 days Temporal Orientation: Day: Correct Recall: "Sock": Yes, no cue required Recall: "Blue": Yes, no cue required Recall: "Bed": Yes, no cue required BIMS Summary Score: 15 Sensation Sensation Light Touch: Impaired by gross assessment Additional Comments: Decreased distal sensation in d/t RLE peripheral neuropathy Coordination Gross Motor Movements are Fluid and Coordinated: Yes Fine Motor Movements are Fluid and Coordinated: Yes Finger Nose Finger Test: WNL Motor  Motor Motor: Within Functional Limits Mobility  Bed Mobility Supine to Sit: Independent Sit to Supine: Independent Transfers Sit to Stand: Independent with assistive device Stand to Sit: Independent with assistive device  Trunk/Postural Assessment  Cervical Assessment Cervical Assessment: Within Functional Limits Thoracic Assessment Thoracic Assessment: Within Functional Limits Lumbar Assessment Lumbar Assessment:  Within Functional Limits Postural Control Postural Control: Within Functional Limits  Balance Balance Balance Assessed: Yes Static Sitting Balance Static Sitting - Level of Assistance: 7: Independent Dynamic Sitting Balance Dynamic Sitting - Level of Assistance: 7: Independent Static Standing Balance Static Standing - Balance Support: Bilateral upper extremity supported Static Standing - Level of Assistance: 6: Modified independent (Device/Increase time) Dynamic Standing Balance Dynamic Standing - Balance Support: During functional activity;Bilateral upper extremity supported Dynamic  Standing - Level of Assistance: 6: Modified independent (Device/Increase time) Extremity/Trunk Assessment RUE Assessment RUE Assessment: Exceptions to [facility] General Strength Comments: Impaired sensation and dexterity in B hands LUE Assessment LUE Assessment: Exceptions to [facility] General Strength Comments: Impaired sensation and dexterity in B hands   [name] 10/30/2023, 7:48 AM

## 2023-10-30 NOTE — Progress Notes (Signed)
 PROGRESS NOTE   Subjective/Complaints:  Events overnight.  Only using as needed pain medication nightly.  States that pain is approximately 6-8 out of 10, but he is doing well with current regimen.  Briefly discussed incorporating duloxetine , but given the extensive history of psychiatric medications and stable management, we will hold off at this time  States that a nurse practitioner called overnight and asked for them to place dressings for drainage over top of his shrinker sock, not finding a record in the chart for this  Mostly normotensive, other vitasls stable.  LBM yesterday. Continent b/b.   ROS: Denies fevers, chills, N/V, abdominal pain, constipation, diarrhea, SOB, cough, chest pain, new weakness or paraesthesias.   + Pain over surgical site--stable + phantom pain--worsening + left elbow pain--stable + Left ear pain-resolved  Objective:   No results found.   Recent Labs    10/29/23 0708  WBC 7.7  HGB 11.9*  HCT 35.6*  PLT 404*    Recent Labs    10/29/23 0708 10/30/23 0709  NA 140 139  K 4.8 5.1  CL 106 106  CO2 29 28  GLUCOSE 120* 145*  BUN 28* 28*  CREATININE 1.28* 1.46*  CALCIUM  9.6 9.7     Intake/Output Summary (Last 24 hours) at 10/30/2023 1022 Last data filed at 10/30/2023 0730 Gross per 24 hour  Intake 1196 ml  Output 4625 ml  Net -3429 ml        Physical Exam: Vital Signs Blood pressure 121/70, pulse 60, temperature 98.3 F (36.8 C), temperature source Oral, resp. rate 16, height 6\' 4"  (1.93 m), weight 97.3 kg, SpO2 96%. Constitutional: No apparent distress. Appropriate appearance for age. Reclining in bed.  HENT: No JVD. Neck Supple. Trachea midline. Atraumatic, normocephalic. Eyes: PERRLA. EOMI. Visual fields grossly intact.  Cardiovascular: Bradycardic. Peripheral pulses 2+  Respiratory: CTAB. No rales, rhonchi, or wheezing. On RA.  Abdomen: + bowel sounds, normoactive. No  distention or tenderness.  Skin: Staples intact.  Area of dog earring on right lateral margin is reducing in size.  A few areas of scant serous drainage on shrinker sock.  MSK:      Positive left BKA, minimal edema, well-formed. +shrinker, limb gaurd       Strength: 5 out of 5 in all 4 extremities  Neurologic exam:  Cognition: AAO to person, place, time and event.  Language: Fluent, No substitutions or neoglisms. No dysarthria.  Memory: No apparent deficits. Insight: Good insight into current condition.  Mood: Moderate anxiety, pleasant mood and affect.   Sensation: Equal and intact in BL UE and Les that a stocking glove pattern from the right mid calf into the toes   Unchanged 5-30   Assessment/Plan: 1. Functional deficits which require 3+ hours per day of interdisciplinary therapy in a comprehensive inpatient rehab setting. Physiatrist is providing close team supervision and 24 hour management of active medical problems listed below. Physiatrist and rehab team continue to assess barriers to discharge/monitor patient progress toward functional and medical goals  Care Tool:  Bathing    Body parts bathed by patient: Right arm, Left arm, Abdomen, Front perineal area, Chest, Buttocks, Right upper leg, Left upper leg,  Right lower leg, Face, Left lower leg         Bathing assist Assist Level: Set up assist     Upper Body Dressing/Undressing Upper body dressing   What is the patient wearing?: Pull over shirt    Upper body assist Assist Level: Independent    Lower Body Dressing/Undressing Lower body dressing      What is the patient wearing?: Ace wrap/stump shrinker, Underwear/pull up, Pants     Lower body assist Assist for lower body dressing: Independent with assitive device     Toileting Toileting    Toileting assist Assist for toileting: Independent with assistive device     Transfers Chair/bed transfer  Transfers assist     Chair/bed transfer assist  level: Independent with assistive device Chair/bed transfer assistive device: Arboriculturist assist      Assist level: Contact Guard/Touching assist Assistive device: Walker-rolling Max distance: 175'   Walk 10 feet activity   Assist     Assist level: Contact Guard/Touching assist Assistive device: Walker-rolling   Walk 50 feet activity   Assist    Assist level: Contact Guard/Touching assist Assistive device: Walker-rolling    Walk 150 feet activity   Assist Walk 150 feet activity did not occur: Safety/medical concerns  Assist level: Contact Guard/Touching assist Assistive device: Walker-rolling    Walk 10 feet on uneven surface  activity   Assist     Assist level: Minimal Assistance - Patient > 75% Assistive device: Walker-rolling   Wheelchair     Assist Is the patient using a wheelchair?: Yes Type of Wheelchair: Manual    Wheelchair assist level: Independent Max wheelchair distance: 150'    Wheelchair 50 feet with 2 turns activity    Assist        Assist Level: Independent   Wheelchair 150 feet activity     Assist      Assist Level: Independent   Blood pressure 121/70, pulse 60, temperature 98.3 F (36.8 C), temperature source Oral, resp. rate 16, height 6\' 4"  (1.93 m), weight 97.3 kg, SpO2 96%.  1. 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 In addition, they will need to follow up with their PCP, Neurology.   2.  Antithrombotics: -DVT/anticoagulation:  Pharmaceutical: Lovenox  added             -antiplatelet therapy:   3. Pain  Management: Continue Oxycodone  prn. On home dose gabapentin  but will increase gabapentin  to 1200 mg TID and increase Oxy to 10-15 mg q4 hours prn.   5-21: Scheduled Tylenol  1000 mg 3 times daily, oxycodone  5 mg 3 times daily.  Reduce as needed oxycodone  to 5 to 10 mg every 4 hours as needed.  Added Robaxin 750 mg as needed.  5-22: Pain better controlled on current regimen.  5-23: Discussed phantom pains, patient states they are intermittent and does not want additional medication treatment at this time.  5-26: Increase  gabapentin  to 800/800/1000 mg for increasing phantom limb pains at nighttime.  Voltaren gel to left elbow 4 times daily--hopeful pain will improve once back is off  5/29: reduce gabapentin  back to home 800 mg TID d/t elevated BUN  5-30: Offered duloxetine  for additional neuropathic pain control, will hold off given complex psych medication regimen and patient being currently stable with his mood  4. Mood/Behavior/Sleep: LCSW to follow for evaluation and support.              -antipsychotic agents: Caplyta  and Lithium .             --trazodone  prn for chronic insomnia. --Using nightly  5. Neuropsych/cognition: This patient is capable of making decisions on his own behalf. 6. Skin/Wound Care: Continue wound VAC--can change to prevena if present in room.              --continue Vitamin C  and Zinc . Prostat added for wound healing.   5-21:  minimal output at this point--wound VAC removed 5-28  7. Fluids/Electrolytes/Nutrition:  Monitor I/O. Check CMET in am.  5-21: AM labs with mild increase in creatinine; discussed p.o. fluid intakes with patient, encouraging 8 cups of fluid per day, will retest BMP 1 to 2 days  5-22: BUN, creatinine improved.  Monitor.  5-23: Appreciate dietary recommendations with supplementation with Glucerna, Juven, and Prosource for protein and wound healing.  Will monitor weights weekly and assess p.o. intakes as needed.  5/26:  Uncertain of accuracy of I/Os,  will inquire with nursing today (documented excessive UO 5+L daily)--likely inaccurate.. Increase PO fluid intakes--discussed with patient.  Recheck BMP in 2 days.  If continues to uptrend, may need to cut back on gabapentin .  5-29: Nutrition increasing protein supplements, vitamin C , zinc  for wound healing  8.  T2DM: Hgb A1c-7.7  (improved from 10.9 in Feb '25)             --monitor BS ac/hs and use SSI for elevated BS.             --continue Insulin  glargine 40 units/HS and novolog  4 units tid ac.    5-21: Blood sugars generally well-controlled on current regimen.   5-23: Sugars mildly elevated this a.m., had a low in the 90s yesterday so we will trend today and if ongoing greater than 150 will adjust glargine--increased to 43 units at bedtime 5/26: highs consistently at bedtime, likely undercoverage of dinner; increase dinner standing to 6 U 5-27: Looking better controlled --  Monitor on current regimen--May increase long-acting in 1 to 2 days if no lows -well controlled  Monitor for 24 hours. Recent Labs    10/29/23 1654 10/29/23 2120 10/30/23 0610  GLUCAP 104* 167* 144*     9. HTN: Monitor BP tid. Not on altace  at this time.   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 PUD/Candida esophagitis hx: Recent episode of gastroenteritis. Dr. Willy Harvest   - No s/s GIB; Hgb stable; monitor BUN:Cr   12. Bipolar d/o/GAD w/recent severe depressive episode: On Effexor , Adderall, Clonazepam , Gabapentin  and Caplyta .              --Lithium  added May 15th by Dr. Lan[facility]. Continue  - 5-30: Patient says  mood well-controlled on this medication, does not wish to change at this time  13. ADHD: continue Adderall 30 mg bid (increased 10/05/23  for activation)  14. B12 deficiency?:Will check level in am.  377,  normal.  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 Constipation- LBM 6 days ago- will increase bowel meds  Last bowel movement 5-21  5-23: Patient endorsing multiple bowel movements from formed to loose this a.m.  DC scheduled MiraLAX  continue Colace 100 mg twice daily.  5/25: LBM --May need sorbitol if no bowel movement today  5-28: Large bowel movement   17. Cough with low grade fever- will get CXR 1 view-resolved  Patient complains of ongoing cough, minimal production, no fevers or chills.  Repeat chest x-ray negative.  Continue daily Claritin 10 mg and Flonase 1 spray bilateral nares daily.    - resolved  18. Bradycardia: medications reviewed and he is not on any rate controlling agents  - Can be related to oxycodone  use, wean as tolerated, however no appreciable bradycardia in the last 24 hours.  - Asymptomatic, has not recurred, monitor  19. Left elbow pain: XR ordered--most consistent with medial epicondylitis - 5/26: Xray with chronic calcifications of triceps insertion at olecranon + common extensor tendon at radial epicondyle - Consider MRI vs. OP orthopedics referral - Add Voltaren gel 4 times daily--stable, continue for anti-inflammatory effect  20. Left 5th digit impaired adduction: discussed could be 2/2 ulnar nerve compression at the elbow   - May need OP EMG for workup  21. AKI 5/26: BUN/creatinine elevated.  Uncertain of accuracy of I/Os, will inquire with nursing today (documented excessive UO 5+L daily)--Per nursing, outputs likely inaccurate. -   Increase PO intakes. No diuretics. Recheck in 2 days. - 5-28: Creatinine normalized, BUN remains elevated.  May be complicated by protein intake and wound healing.  Monitor closely. - 5/29: BUN/Cr 23:1; Cr stable, BUN uptrending. Encourage PO flui. Reduce gabapentin  as above. Repeat BMP as OP at follow up  22.  Left ear pain.  Self-limited, some changes from ongoing issues with allergies but nothing concerning for infection.   Monitor for now.  - 5/29: resolved LOS: 10 days A FACE TO FACE EVALUATION WAS PERFORMED  Bea Lime 10/30/2023, 10:22 AM

## 2023-10-30 NOTE — Progress Notes (Signed)
 Occupational Therapy Session Note  Patient Details  Name: [name] MRN: [id number] Date of Birth: [birth year]  Session 1 Today's Date: 10/30/2023 OT Individual Time: 0820-0930 OT Individual Time Calculation (min): 70 min    Session 2 Today's Date: 10/30/2023 OT Individual Time: 4098-1191 OT Individual Time Calculation (min): 54 min    Short Term Goals: Week 1:  OT Short Term Goal 1 (Week 1): STG= LTG d/t ELOS  Skilled Therapeutic Interventions/Progress Updates:    Pt received supine with no c/o pain and agreeable to OT session. He came to EOB and completed functional mobility into the bathroom with the RW with mod I. He was able to manage limb guard and wrap his own limb to occlude water for shower. He completed bathing at mod I level on the TTB. He returned to the w/c following and dressed with mod I. Discussed d/c planning at length. Created extensive HEP to bridge the gap between d/c and prosthetic f/u. Alternated days to address core/LB, UB, and cardiovascular endurance/balance. Performed teachback with the UB focused HEP with hands on facilitation to ensure proper muscle activation. He returned to his room following and was left sitting up with all needs met.    Day 1: Core/LB Access Code: [email or web address] URL: [email or web address] Date: 10/30/2023 Prepared by: Eilene Grater  Exercises - Supine Bridge  - 1 x daily - 7 x weekly - 3 sets - 10 reps - Supine Active Straight Leg Raise  - 1 x daily - 7 x weekly - 3 sets - 10 reps - Supine Lower Trunk Rotation  - 1 x daily - 7 x weekly - 3 sets - 10 reps - Clamshell  - 1 x daily - 7 x weekly - 3 sets - 10 reps - Sidelying Hip Abduction  - 1 x daily - 7 x weekly - 3 sets - 10 reps - Supine Quad Set  - 1 x daily - 7 x weekly - 3 sets - 10 reps - Prone Press Up  - 1 x daily - 7 x weekly - 3 sets - 10 reps - Prone Hip Extension  - 1 x daily - 7 x weekly - 3 sets - 10 reps   UB with dumbbells and resistance band Access  Code: [email or web address] URL: [email or web address] Date: 10/30/2023 Prepared by: Eilene Grater  Exercises - Seated Shoulder Flexion with Dumbbells  - 1 x daily - 7 x weekly - 3 sets - 10 reps - Seated Shoulder Abduction with Dumbbells - Thumbs Up  - 1 x daily - 7 x weekly - 3 sets - 10 reps - Seated Overhead Press with Dumbbells  - 1 x daily - 7 x weekly - 3 sets - 10 reps - Seated Shoulder Diagonal Pulls with Resistance  - 1 x daily - 7 x weekly - 3 sets - 10 reps - Seated Shoulder Row with Resistance Anchored at Feet  - 1 x daily - 7 x weekly - 3 sets - 10 reps - Seated Bicep Curls Supinated with Dumbbells  - 1 x daily - 7 x weekly - 3 sets - 10 reps - Seated Elbow Extension with Self-Anchored Resistance  - 1 x daily - 7 x weekly - 3 sets - 10 reps  Balance  Access Code: [email or web address] URL: [email or web address] Date: 10/30/2023 Prepared by: Eilene Grater  Exercises - Sit to Stand  - 1 x daily - 7 x weekly - 3 sets - 10 reps - Mini Squat with Counter Support  - 1 x  daily - 7 x weekly - 3 sets - 10 reps - Standing Hip Abduction with Counter Support  - 1 x daily - 7 x weekly - 3 sets - 10 reps - Standing Hip Extension with Counter Support  - 1 x daily - 7 x weekly - 3 sets - 10 reps   Session 2 Pt received supine with no c/o pain, agreeable to OT session. Stand pivot to the w/c with mod I. Pt propelled 200 ft to the therapy gym with mod I. Pt completed floor transfer, lowering himself to the floor and then with BUE pushing up to a surface posteriorly, simulating floor to couch or chair at home. OT provided education on fall recovery, when to get up independently vs when to call for EMS after a fall. Pt returned the demonstration with (S). Next, reviewed LB and balance HEP with teachback and hands on facilitation to ensure proper muscle activation and technique. HEP's listed above for reference. Pt returned to his room and was left sitting up with all needs met.    Therapy  Documentation Precautions:  Precautions Precautions: Fall Recall of Precautions/Restrictions: Intact Precaution/Restrictions Comments: L BKA Restrictions Weight Bearing Restrictions Per Provider Order: Yes LLE Weight Bearing Per Provider Order: Non weight bearing Therapy/Group: Individual Therapy  Una Ganser 10/30/2023, 7:52 AM

## 2023-10-30 NOTE — Progress Notes (Signed)
 Inpatient Rehabilitation Discharge Medication Review by a Pharmacist  A complete drug regimen review was completed for this patient to identify any potential clinically significant medication issues.  High Risk Drug Classes Is patient taking? Indication by Medication  Antipsychotic Yes Caplyta  -mood  Anticoagulant No   Antibiotic No   Opioid Yes Oxycodone  prn pain  Antiplatelet No   Hypoglycemics/insulin  Yes Insulin  - DM  Vasoactive Medication No   Chemotherapy No   Other Yes Desvenlafaxine, Lithium - mood Adderall - attention Atorvastatin , Fenofibrate - HLD Gabapentin  - Pain Trazodone  prn sleep Diclofenac - pain Methocarbamol prn spasms Pantoprazole  - reflux Zinc  - supplement     Type of Medication Issue Identified Description of Issue Recommendation(s)  Drug Interaction(s) (clinically significant)     Duplicate Therapy     Allergy     No Medication Administration End Date     Incorrect Dose     Additional Drug Therapy Needed     Significant med changes from prior encounter (inform family/care partners about these prior to discharge).    Other       Clinically significant medication issues were identified that warrant physician communication and completion of prescribed/recommended actions by midnight of the next day:  No  Name of provider notified for urgent issues identified:   Provider Method of Notification:     Pharmacist comments:   Time spent performing this drug regimen review (minutes):  20 minutes  Thank you. Lennice Quivers, PharmD

## 2023-10-30 NOTE — Discharge Summary (Signed)
 Physician Discharge Summary  Patient ID: [name] MRN: [id number] DOB/AGE: February 10, 1966 58 y.o.  Admit date: 10/20/2023 Discharge date: 10/31/2023  Discharge Diagnoses:  Principal Problem:   S/P BKA (below knee amputation) unilateral, left (HCC) Active Problems:   Osteomyelitis of left foot (HCC)   Malnutrition of moderate degree   Bipolar II disorder (HCC)   Chronic kidney disease   ABLA  T2DM  OIC  Neuropathy  Left elbow pain   Discharged Condition: stable  Significant Diagnostic Studies: DG Elbow 2 Views Left Result Date: 10/25/2023 CLINICAL DATA:  Left elbow pain EXAM: LEFT ELBOW - 2 VIEW COMPARISON:  None Available. FINDINGS: Elongated and partially chronically fragmented or corticated spur of the olecranon the triceps insertion site. There is also a double contour of calcification along the radial epicondylar region probably from spurring involving the common extensor tendon origination site. No joint effusion or malalignment. No visible fracture. IMPRESSION: 1. Elongated and partially chronically fragmented or corticated spur of the olecranon at the triceps insertion site. 2. Double contour of calcification along the radial epicondylar region probably from spurring involving the common extensor tendon origination site. 3. If pain persists despite conservative therapy, MRI may be warranted for further characterization. Electronically Signed   By: [name] M.D.   On: 10/25/2023 16:33   DG CHEST PORT 1 VIEW Result Date: 10/23/2023 CLINICAL DATA:  Cough EXAM: PORTABLE CHEST 1 VIEW COMPARISON:  09/22/2023 FINDINGS: Single frontal view of the chest demonstrates an unremarkable cardiac silhouette. Chronic linear scarring at the left lung base. No acute airspace disease, effusion, or pneumothorax. Prior ORIF left clavicular fracture. No acute bony abnormalities. IMPRESSION: 1. No acute intrathoracic process. Electronically Signed   By: [name] M.D.   On: 10/23/2023  15:20   XR Knee 1-2 Views Left Result Date: 10/08/2023 2 view radiographs of the right knee shows no evidence of a tibial plateau fracture.   Labs:  Basic Metabolic Panel: Recent Labs  Lab 10/26/23 0549 10/28/23 0515 10/29/23 0708 10/30/23 0709  NA 139 136 140 139  K 4.5 3.9 4.8 5.1  CL 105 105 106 106  CO2 27 26 29 28   GLUCOSE 154* 210* 120* 145*  BUN 21* 25* 28* 28*  CREATININE 1.32* 1.21 1.28* 1.46*  CALCIUM  9.4 9.1 9.6 9.7    CBC: Recent Labs  Lab 10/26/23 0549 10/29/23 0708  WBC 7.3 7.7  HGB 11.2* 11.9*  HCT 33.6* 35.6*  MCV 86.2 87.0  PLT 373 404*    CBG: Recent Labs  Lab 10/29/23 0600 10/29/23 1156 10/29/23 1654 10/29/23 2120 10/30/23 0610  GLUCAP 103* 135* 104* 167* 144*    Brief HPI:   [name] is a 58 y.o. 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  He has had progressive breakdown with ulceration to the bone and pain in the left knee.  He was evaluated by Dr. Julio [name] and underwent left BKA on 10/16/2023.  Patient was working with therapy and was requiring contact-guard to min assist with mobility and ADLs.  He lives alone and was independent prior to admission.  CIR was recommended due to functional decline.   Hospital Course: [name] was admitted to rehab 10/20/2023 for inpatient therapies to consist of PT and OT at least three hours five days a week. Past admission physiatrist, therapy team and rehab RN have worked together  to provide customized collaborative inpatient rehab.  His blood pressures were monitored on TID basis and has been stable.  Gabapentin  was titrated upwards to help manage neuropathy but then decreased to 80 mg 3 times daily due to elevated BUN.  Mood is stable on home dose Caplyta  and lithium .  Vitamin C  and zinc  added to help with wound healing.   Wound VAC was removed on 05/28 and incision noted to be healing well without any signs or symptoms of infection.  Pain has been controlled with as needed use of oxycodone  and OIC has been managed with augmentation of bowel regimen.  He has reported cough with low-grade fever.  Chest x-ray done and was negative for acute disease.  Claritin  and Flonase  added to help with allergy symptoms.   X-rays were ordered due to reports of left elbow pain and showed chronic calcification triceps insertion at the olecranon plus common extensor tendon at radial epicondyle.  Voltaren  gel was added with improvement in symptoms.  Follow-up CBC shows acute blood loss anemia to be stable.  Check of electrolytes showed acute on chronic renal failure and he was encouraged to increase fluid intake due to rising BUN.  His diabetes has been monitored with ac/hs CBG checks and SSI was use prn for tighter BS control.  P.o. intake has been good.  He has made good gains during his rehab stay and is modified independent at discharge.  No follow-up therapy recommended until ready for prosthetic training.  He has been given home exercise plan.    Rehab course: During patient's stay in rehab weekly team conferences were held to monitor patient's progress, set goals and discuss barriers to discharge. At admission, patient required min assist with basic ADL tasks and with mobility. He  has had improvement in activity tolerance, balance, postural control as well as ability to compensate for deficits.  He is able to complete ADL tasks at modified independent level.  He is independent for transfers and to ambulate 175 feet with use of rolling walker.  Has sister will assist as needed after discharge.   Discharge disposition: 01-Home or Self Care  Diet: Carb modified  Special Instructions: Keep incision clean and dry. Wear stump shrinker Recommend recheck BMET/ CBC in 1-2 weeks Continue home insulin  regimen and resume CGM.  Discharge  Instructions     Ambulatory referral to Physical Medicine Rehab   Complete by: As directed    Moderate complexity follow-up 1 to 2 weeks left BKA      Allergies as of 10/31/2023       Reactions   Ritalin [methylphenidate] Palpitations, Other (See Comments)   Hyperactivity, also        Medication List     STOP taking these medications    OVER THE COUNTER MEDICATION   ramipril  2.5 MG capsule Commonly known as: ALTACE        TAKE these medications    Acetaminophen  Extra Strength 500 MG Tabs Take 2 tablets (1,000 mg total) by mouth 3 (three) times daily.   amphetamine -dextroamphetamine  30 MG tablet Commonly known as: ADDERALL Take 1 tablet by mouth 2 (two) times daily.   ascorbic acid  1000 MG tablet Commonly known as: VITAMIN C  Take 1 tablet (1,000 mg total) by mouth daily.   atorvastatin  10 MG tablet Commonly known as: LIPITOR Take 10 mg by mouth daily.   Blood Glucose Monitoring Suppl Devi 1 each by Does not apply route 3 (three) times daily. May dispense any manufacturer covered by patient's insurance.  BLOOD GLUCOSE TEST STRIPS Strp 1 each by Does not apply route 3 (three) times daily. Use as directed to check blood sugar. May dispense any manufacturer covered by patient's insurance and fits patient's device.   Caplyta  42 MG capsule Generic drug: lumateperone  tosylate Take 42 mg by mouth at bedtime.   clonazePAM  1 MG tablet Commonly known as: KLONOPIN  Take 1-1.5 mg by mouth at bedtime.   cyanocobalamin 1000 MCG tablet Commonly known as: VITAMIN B12 Take 2,000 mcg by mouth daily.   Desvenlafaxine ER 100 MG Tb24 Take 100 mg by mouth daily.   diclofenac  Sodium 1 % Gel Commonly known as: VOLTAREN  Apply 2 g topically 4 (four) times daily. To left elbow   docusate sodium  100 MG capsule Commonly known as: COLACE Take 1 capsule (100 mg total) by mouth 2 (two) times daily.   fenofibrate  160 MG tablet Take 1 tablet (160 mg total) by mouth daily.    fluticasone  50 MCG/ACT nasal spray Commonly known as: FLONASE  Place 1 spray into both nostrils daily.   FreeStyle Libre 14 Day Reader Seymour Dapper every 14 (fourteen) days.   FreeStyle Libre 14 Day Sensor Misc   gabapentin  800 MG tablet Commonly known as: NEURONTIN  Take 800 mg by mouth 3 (three) times daily.   Gvoke HypoPen  2-Pack 1 MG/0.2ML Soaj Generic drug: Glucagon  Inject 1 mg into the skin as needed for up to 2 doses (Severe low blood sugar).   insulin  aspart 100 UNIT/ML FlexPen Commonly known as: NOVOLOG  Inject 0-6 Units into the skin 3 (three) times daily with meals. Check Blood Glucose (BG) and inject per scale: BG <150= 0 unit; BG 150-200= 1 unit; BG 201-250= 2 unit; BG 251-300= 3 unit; BG 301-350= 4 unit; BG 351-400= 5 unit; BG >400= 6 unit and Call Primary Care. What changed:  how much to take when to take this reasons to take this additional instructions   insulin  glargine 100 UNIT/ML Solostar Pen Commonly known as: LANTUS  Inject 45 Units into the skin at bedtime. May substitute as needed per insurance.   JUVEN PO Take 1 Container by mouth 2 (two) times daily.   Lancet Device Misc 1 each by Does not apply route 3 (three) times daily. May dispense any manufacturer covered by patient's insurance.   Lancets Misc 1 each by Does not apply route 3 (three) times daily. Use as directed to check blood sugar. May dispense any manufacturer covered by patient's insurance and fits patient's device.   lithium  carbonate 150 MG capsule Take 150 mg by mouth at bedtime.   loratadine  10 MG tablet Commonly known as: CLARITIN  Take 1 tablet (10 mg total) by mouth daily.   methocarbamol  750 MG tablet Commonly known as: ROBAXIN  Take 1 tablet (750 mg total) by mouth every 6 (six) hours as needed for muscle spasms.   multivitamin with minerals Tabs tablet Take 1 tablet by mouth daily.   oxyCODONE  5 MG immediate release tablet--Rx # 20 pills Commonly known as: Oxy IR/ROXICODONE  Take  1 tablet (5 mg total) by mouth every 6 (six) hours as needed for severe pain (pain score 7-10).   pantoprazole  40 MG tablet Commonly known as: PROTONIX  Take 1 tablet (40 mg total) by mouth daily.   protein supplement Powd Take 6 g by mouth 3 (three) times daily with meals.   TechLite Pen Needles 31G X 8 MM Misc Generic drug: Insulin  Pen Needle USE TO GIVE INSULIN  4 TIMES DAILY   Pen Needles 31G X 5 MM Misc 1 each  by Does not apply route 3 (three) times daily. May dispense any manufacturer covered by patient's insurance.   traZODone  50 MG tablet Commonly known as: DESYREL  Take 50-100 mg by mouth at bedtime as needed for sleep.   Vitamin D3 Maximum Strength 125 MCG (5000 UT) capsule Generic drug: Cholecalciferol Take 5,000 Units by mouth daily.   Zinc  Sulfate 220 (50 Zn) MG Tabs Take 1 tablet (220 mg total) by mouth daily.        Follow-up Information     Mariella Shore, MD Follow up.   Specialty: Internal Medicine Why: Call in 1-2 days for post hospital follow up Contact information: [address] [place] [address] [phone]         Timothy Ford, MD Follow up.   Specialty: Orthopedic Surgery Why: Call in 1-2 days for post hospital follow up Contact information: [address]  [place] [address] [phone]         Cherri Corns C, DO Follow up.   Specialty: Physical Medicine and Rehabilitation Why: office will call you with follow up appointment Contact information: [address] [place] [address] [phone]                 Signed: Zelda Hickman 11/04/2023, 12:26 AM

## 2023-10-31 DIAGNOSIS — I10 Essential (primary) hypertension: Secondary | ICD-10-CM

## 2023-10-31 DIAGNOSIS — K5901 Slow transit constipation: Secondary | ICD-10-CM

## 2023-10-31 LAB — GLUCOSE, CAPILLARY
Glucose-Capillary: 161 mg/dL — ABNORMAL HIGH (ref 70–99)
Glucose-Capillary: 169 mg/dL — ABNORMAL HIGH (ref 70–99)

## 2023-10-31 NOTE — Progress Notes (Signed)
 PROGRESS NOTE   Subjective/Complaints:  Pt doing well, ready to go home! Slept well, pain well managed, LBM yesterday, urinating fine. Denies any other complaints or concerns.     ROS: Denies fevers, chills, N/V, abdominal pain, constipation, diarrhea, SOB, cough, chest pain, new weakness or paraesthesias.   + Pain over surgical site--stable + phantom pain--stable + left elbow pain--stable + Left ear pain-resolved  Objective:   No results found.   Recent Labs    10/29/23 0708  WBC 7.7  HGB 11.9*  HCT 35.6*  PLT 404*    Recent Labs    10/29/23 0708 10/30/23 0709  NA 140 139  K 4.8 5.1  CL 106 106  CO2 29 28  GLUCOSE 120* 145*  BUN 28* 28*  CREATININE 1.28* 1.46*  CALCIUM  9.6 9.7     Intake/Output Summary (Last 24 hours) at 10/31/2023 1045 Last data filed at 10/31/2023 1020 Gross per 24 hour  Intake 720 ml  Output 3525 ml  Net -2805 ml        Physical Exam: Vital Signs Blood pressure 110/67, pulse (!) 59, temperature 97.8 F (36.6 C), temperature source Oral, resp. rate 18, height 6\' 4"  (1.93 m), weight 97.3 kg, SpO2 98%.  Constitutional: No apparent distress. Appropriate appearance for age. Reclined in bed.  HENT: No JVD. Neck Supple. Trachea midline. Atraumatic, normocephalic. Eyes: PERRLA. EOMI. Visual fields grossly intact.  Cardiovascular: mildly bradycardic. Peripheral pulses 2+  Respiratory: CTAB. No rales, rhonchi, or wheezing. On RA.  Abdomen: + bowel sounds, normoactive. No distention or tenderness.  Ext: L BKA, shrinker sock on. No edema of RLE.   PRIOR EXAMS: Skin: Staples intact.  Area of dog earring on right lateral margin is reducing in size.    MSK:      Positive left BKA, minimal edema, well-formed. +shrinker, limb gaurd       Strength: 5 out of 5 in all 4 extremities  Neurologic exam:  Cognition: AAO to person, place, time and event.  Language: Fluent, No substitutions or  neoglisms. No dysarthria.  Memory: No apparent deficits. Insight: Good insight into current condition.  Mood: Moderate anxiety, pleasant mood and affect.   Sensation: Equal and intact in BL UE and Les that a stocking glove pattern from the right mid calf into the toes   Unchanged 5-30   Assessment/Plan: 1. Functional deficits which require 3+ hours per day of interdisciplinary therapy in a comprehensive inpatient rehab setting. Physiatrist is providing close team supervision and 24 hour management of active medical problems listed below. Physiatrist and rehab team continue to assess barriers to discharge/monitor patient progress toward functional and medical goals  Care Tool:  Bathing    Body parts bathed by patient: Right arm, Left arm, Abdomen, Front perineal area, Chest, Buttocks, Right upper leg, Left upper leg, Right lower leg, Face, Left lower leg         Bathing assist Assist Level: Set up assist     Upper Body Dressing/Undressing Upper body dressing   What is the patient wearing?: Pull over shirt    Upper body assist Assist Level: Independent    Lower Body Dressing/Undressing Lower body dressing  What is the patient wearing?: Ace wrap/stump shrinker, Underwear/pull up, Pants     Lower body assist Assist for lower body dressing: Independent with assitive device     Toileting Toileting    Toileting assist Assist for toileting: Independent with assistive device     Transfers Chair/bed transfer  Transfers assist     Chair/bed transfer assist level: Independent with assistive device Chair/bed transfer assistive device: Geologist, engineering   Ambulation assist      Assist level: Independent with assistive device Assistive device: Walker-rolling Max distance: 175'   Walk 10 feet activity   Assist     Assist level: Independent with assistive device Assistive device: Walker-rolling   Walk 50 feet activity   Assist     Assist level: Independent with assistive device Assistive device: Walker-rolling    Walk 150 feet activity   Assist Walk 150 feet activity did not occur: Safety/medical concerns  Assist level: Independent with assistive device Assistive device: Walker-rolling    Walk 10 feet on uneven surface  activity   Assist     Assist level: Supervision/Verbal cueing Assistive device: Walker-rolling   Wheelchair     Assist Is the patient using a wheelchair?: Yes Type of Wheelchair: Manual    Wheelchair assist level: Independent Max wheelchair distance: 300'    Wheelchair 50 feet with 2 turns activity    Assist        Assist Level: Independent   Wheelchair 150 feet activity     Assist      Assist Level: Independent   Blood pressure 110/67, pulse (!) 59, temperature 97.8 F (36.6 C), temperature source Oral, resp. rate 18, height 6\' 4"  (1.93 m), weight 97.3 kg, SpO2 98%.  1. Functional deficits secondary to L BKA due to osteomyelitis of L foot- subacute             -patient may not shower due to VAC             -ELOS/Goals: 10-14 days- mod I- going home with support from his sister, DC 5-31             Stable to continue CIR  - 5-22: Order for additional shrinker sock and MP shield today  -10/31/23 d/c home, meds/paperwork reviewed.  The patient is medically ready for discharge to home and will need follow-up with [facility] PM&R. In addition, they will need to follow up with their PCP, Neurology.   2.  Antithrombotics: -DVT/anticoagulation:  Pharmaceutical: Lovenox  added             -antiplatelet therapy:   3. Pain Management: Continue Oxycodone  prn. On home dose gabapentin  but will increase gabapentin  to 1200 mg TID and increase Oxy to 10-15 mg q4 hours prn.   5-21: Scheduled Tylenol  1000 mg 3 times daily, oxycodone  5 mg 3 times daily.  Reduce as needed oxycodone  to 5 to 10 mg every 4 hours as needed.  Added Robaxin  750 mg as needed.  5-22: Pain better  controlled on current regimen.  5-23: Discussed phantom pains, patient states they are intermittent and does not want additional medication treatment at this time.  5-26: Increase gabapentin  to 800/800/1000 mg for increasing phantom limb pains at nighttime.  Voltaren  gel to left elbow 4 times daily--hopeful pain will improve once back is off  5/29: reduce gabapentin  back to home 800 mg TID d/t elevated BUN  5-30: Offered duloxetine  for additional neuropathic pain control, will hold off given complex psych medication regimen  and patient being currently stable with his mood  4. Mood/Behavior/Sleep: LCSW to follow for evaluation and support.              -antipsychotic agents: Caplyta  and Lithium .             --trazodone  prn for chronic insomnia. --Using nightly  5. Neuropsych/cognition: This patient is capable of making decisions on his own behalf. 6. Skin/Wound Care: Continue wound VAC--can change to prevena if present in room.              --continue Vitamin C  and Zinc . Prostat added for wound healing.   5-21:  minimal output at this point--wound VAC removed 5-28  7. Fluids/Electrolytes/Nutrition:  Monitor I/O. Check CMET in am.  5-21: AM labs with mild increase in creatinine; discussed p.o. fluid intakes with patient, encouraging 8 cups of fluid per day, will retest BMP 1 to 2 days  5-22: BUN, creatinine improved.  Monitor.  5-23: Appreciate dietary recommendations with supplementation with Glucerna, Juven, and Prosource for protein and wound healing.  Will monitor weights weekly and assess p.o. intakes as needed.  5/26:  Uncertain of accuracy of I/Os, will inquire with nursing today (documented excessive UO 5+L daily)--likely inaccurate.. Increase PO fluid intakes--discussed with patient.  Recheck BMP in 2 days.  If continues to uptrend, may need to cut back on gabapentin .  5-29: Nutrition increasing protein supplements, vitamin C , zinc  for wound healing  8.  T2DM: Hgb A1c-7.7  (improved from  10.9 in Feb '25)             --monitor BS ac/hs and use SSI for elevated BS.             --continue Insulin  glargine 40 units/HS and novolog  4 units tid ac.    5-21: Blood sugars generally well-controlled on current regimen.   5-23: Sugars mildly elevated this a.m., had a low in the 90s yesterday so we will trend today and if ongoing greater than 150 will adjust glargine--increased to 43 units at bedtime 5/26: highs consistently at bedtime, likely undercoverage of dinner; increase dinner standing to 6 U 5-27: Looking better controlled --  Monitor on current regimen--May increase long-acting in 1 to 2 days if no lows -well controlled -10/31/23 mostly better controlled except one high yesterday; monitor on regimen outpatient  Monitor for 24 hours. Recent Labs    10/30/23 1656 10/30/23 2107 10/31/23 0558  GLUCAP 151* 255* 161*     9. HTN: Monitor BP tid. Not on altace  at this time.   - 10/31/23 normotensive, monitor outpatient    10/31/2023    4:38 AM 10/30/2023    7:53 PM 10/30/2023    1:10 PM  Vitals with BMI  Systolic 110 139 161  Diastolic 67 72 87  Pulse 59 74 71    10.  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 PUD/Candida esophagitis hx: Recent episode of gastroenteritis. Dr. Willy Harvest   - No s/s GIB; Hgb stable; monitor BUN:Cr   12. Bipolar d/o/GAD w/recent severe depressive episode: On Effexor , Adderall, Clonazepam , Gabapentin  and Caplyta .              --Lithium  added May 15th by Dr. [facility]. Continue  - 5-30: Patient says mood well-controlled on this medication, does not wish to change at this time  13. ADHD: continue Adderall 30 mg bid (increased 10/05/23  for activation)  14. B12 deficiency?:Will check level in am.  377, normal.  15. [employer] Skill of  RLE-   16. Constipation- LBM 6 days ago- will increase bowel meds  Last bowel movement 5-21  5-23: Patient endorsing multiple bowel movements from formed to loose this a.m.  DC scheduled  MiraLAX  continue Colace 100 mg twice daily.  5/25: LBM --May need sorbitol  if no bowel movement today  10/31/23 LBM yesterday, cont regimen outpatient  17. Cough with low grade fever- will get CXR 1 view-resolved  Patient complains of ongoing cough, minimal production, no fevers or chills.  Repeat chest x-ray negative.  Continue daily Claritin  10 mg and Flonase  1 spray bilateral nares daily.    - resolved  18. Bradycardia: medications reviewed and he is not on any rate controlling agents  - Can be related to oxycodone  use, wean as tolerated, however no appreciable bradycardia in the last 24 hours.  - Asymptomatic, has not recurred, monitor  19. Left elbow pain: XR ordered--most consistent with medial epicondylitis - 5/26: Xray with chronic calcifications of triceps insertion at olecranon + common extensor tendon at radial epicondyle - Consider MRI vs. OP orthopedics referral - Add Voltaren  gel 4 times daily--stable, continue for anti-inflammatory effect  20. Left 5th digit impaired adduction: discussed could be 2/2 ulnar nerve compression at the elbow   - May need OP EMG for workup  21. AKI 5/26: BUN/creatinine elevated.  Uncertain of accuracy of I/Os, will inquire with nursing today (documented excessive UO 5+L daily)--Per nursing, outputs likely inaccurate. -   Increase PO intakes. No diuretics. Recheck in 2 days. - 5-28: Creatinine normalized, BUN remains elevated.  May be complicated by protein intake and wound healing.  Monitor closely. - 5/29: BUN/Cr 23:1; Cr stable, BUN uptrending. Encourage PO flui. Reduce gabapentin  as above. Repeat BMP as OP at follow up  22.  Left ear pain.  Self-limited, some changes from ongoing issues with allergies but nothing concerning for infection.  Monitor for now.  - 5/29: resolved   LOS: 11 days A FACE TO FACE EVALUATION WAS PERFORMED  [address] 10/31/2023, 10:45 AM

## 2023-11-02 NOTE — Progress Notes (Signed)
 Inpatient Rehabilitation Care Coordinator Discharge Note   Patient Details  Name: [name] MRN: [id number] Date of Birth: 22-Nov-1965   Discharge location: D/c to home  Length of Stay: 10 days  Discharge activity level: independent WC level and ambulatory level Modified Independent.  Home/community participation: Limited  Patient response WU:[id number] Literacy - How often do you need to have someone help you when you read instructions, pamphlets, or other written material from your doctor or pharmacy?: Never  Patient response YN:WGNFAO Isolation - How often do you feel lonely or isolated from those around you?: Rarely  Services provided included: MD, RD, PT, OT, RN, CM, TR, Pharmacy, Neuropsych, SW  Financial Services:  Field seismologist Utilized: Media planner [place] [employer]  Choices offered to/list presented to: patient  Follow-up services arranged:  DME, Other (Comment) (therapy deferred until prosthesis)      DME : [employer] [facility] for 3in1 BSC, TTB, and w/c    Patient response to transportation need: Is the patient able to respond to transportation needs?: Yes In the past 12 months, has lack of transportation kept you from medical appointments or from getting medications?: No In the past 12 months, has lack of transportation kept you from meetings, work, or from getting things needed for daily living?: No   Patient/Family verbalized understanding of follow-up arrangements:  Yes  Individual responsible for coordination of the follow-up plan: contact pt  Confirmed correct DME delivered: Rennis Case 11/02/2023    Comments (or additional information):  Summary of Stay    Date/Time Discharge Planning CSW  10/27/23 1012 Pt will d/c to home. His ister [name] will come to the home to help assist; she will work remotely. SW will confirm there are no barriers to discharge. AAC       [name] [name]

## 2023-11-04 ENCOUNTER — Ambulatory Visit (INDEPENDENT_AMBULATORY_CARE_PROVIDER_SITE_OTHER): Admitting: Family

## 2023-11-04 ENCOUNTER — Encounter: Payer: Self-pay | Admitting: Family

## 2023-11-04 DIAGNOSIS — Z89512 Acquired absence of left leg below knee: Secondary | ICD-10-CM

## 2023-11-04 NOTE — Progress Notes (Signed)
 Post-Op Visit Note   Patient: [name]           Date of Birth: 1965-10-22           MRN: [id number] Visit Date: 11/04/2023 PCP: Mariella Shore, MD  Chief Complaint:  Chief Complaint  Patient presents with   Left Leg - Routine Post Op    10/16/2023 left BKA     HPI:  HPI The patient is a 58 year old gentleman who presents status post below-knee amputation on the left from May 16  Is currently in a 2 XL shrinker  Has no concerns.  Is eager to get set up with his prosthesis.  Patient is a new left transtibial  amputee.  Patient's current comorbidities are not expected to impact the ability to function with the prescribed prosthesis. Patient verbally communicates a strong desire to use a prosthesis. Patient currently requires mobility aids to ambulate without a prosthesis.  Expects not to use mobility aids with a new prosthesis.  Patient is a K3 level ambulator that spends a lot of time walking around on uneven terrain over obstacles, up and down stairs, and ambulates with a variable cadence.    Ortho Exam On examination left residual limb staples are in place this is well consolidated healing well harvested about half the staples.  Visit Diagnoses: No diagnosis found.  Plan: Continue daily Dial soap cleansing shrinker with direct skin contact.  Given an order for his prosthesis set up today.  Plan to harvest staples at next visit  Follow-Up Instructions: No follow-ups on file.   Imaging: No results found.  Orders:  No orders of the defined types were placed in this encounter.  No orders of the defined types were placed in this encounter.    PMFS History: Patient Active Problem List   Diagnosis Date Noted   Bipolar II disorder (HCC) 10/23/2023   Malnutrition of moderate degree 10/22/2023   Osteomyelitis of left foot (HCC) 10/21/2023   S/P BKA (below knee amputation) unilateral, left (HCC) 10/16/2023   Thrombophlebitis arm 08/03/2023   Hypokalemia  07/30/2023   Subacute osteomyelitis, left ankle and foot (HCC) 07/29/2023   Foot ulcer with fat layer exposed, left (HCC) 11/15/2019   Displaced fracture of shaft of left clavicle, initial encounter for closed fracture 03/15/2019   Dupuytren's contracture 07/29/2018   PICC (peripherally inserted central catheter) in place 05/24/2018   Neuropathy 05/12/2018   Osteomyelitis of ankle or foot, acute, left (HCC) 05/05/2018   Diabetic ulcer of left midfoot associated with type 2 diabetes mellitus, limited to breakdown of skin (HCC) 12/18/2017   Chronic pain syndrome 11/03/2017   Gastroesophageal reflux disease with esophagitis without hemorrhage 09/17/2017   Esophageal dysphagia    DKA, type 2 (HCC) 07/15/2017   ADD (attention deficit disorder) 07/15/2017   DKA (diabetic ketoacidosis) (HCC) 07/15/2017   Vitamin B12 deficiency (non anemic) 08/13/2016   Adhesive capsulitis of left shoulder 01/18/2016   Trigger ring finger of right hand 10/11/2015   Primary osteoarthritis of first carpometacarpal joint of left hand 06/14/2015   Radial styloid tenosynovitis 06/14/2015   Depression 10/05/2014   Noncompliance with diabetes treatment 10/05/2014   Vitamin D deficiency 09/14/2014   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   Diabetic peripheral neuropathy associated with type 2 diabetes mellitus (HCC) 06/21/2014   Essential hypertension 06/21/2014   Erectile dysfunction associated with type 2 diabetes mellitus (HCC) 06/21/2014   Long-term current use of insulin  for diabetes mellitus (HCC) 06/21/2014   Adhesive capsulitis of right  shoulder 03/24/2011   Retinal detachment 12/06/2010   Rotator cuff syndrome 12/06/2010   History of pancreatitis 10/22/2010   Hypertriglyceridemia 10/22/2010   Diabetes mellitus type II, uncontrolled 10/22/2010   Past Medical History:  Diagnosis Date   ADHD (attention deficit hyperactivity disorder)    Anxiety and depression    Candida esophagitis (HCC)    in  setting of DKA/hyperglycemia   CIDP (chronic inflammatory demyelinating polyneuropathy) (HCC)    Depression    Diabetes type 2, controlled (HCC)    Diverticulosis    Essential hypertension 06/21/2014   Hyperlipidemia    Kidney stones    Pancreatitis    Stomach ulcer    Vitamin D deficiency     Family History  Problem Relation Age of Onset   Stroke Father    Breast cancer Mother    Colon cancer Neg Hx     Past Surgical History:  Procedure Laterality Date   AMPUTATION Left 10/16/2023   Procedure: LEFT BELOW KNEE AMPUTATION;  Surgeon: Timothy Ford, MD;  Location: [facility] OR;  Service: Orthopedics;  Laterality: Left;  LEFT BELOW KNEE AMPUTATION   ANKLE SURGERY     APPLICATION OF WOUND VAC Left 10/16/2023   Procedure: APPLICATION, WOUND VAC;  Surgeon: Timothy Ford, MD;  Location: [facility] OR;  Service: Orthopedics;  Laterality: Left;   CERVICAL DISC SURGERY     C4-C5   ELBOW SURGERY     ESOPHAGOGASTRODUODENOSCOPY N/A 07/16/2017   Procedure: ESOPHAGOGASTRODUODENOSCOPY (EGD);  Surgeon: Kenney Peacemaker, MD;  Location[facility] ENDOSCOPY;  Service: Endoscopy;  Laterality: N/A;   IRRIGATION AND DEBRIDEMENT FOOT Left 07/31/2023   Procedure: IRRIGATION AND DEBRIDEMENT FOOT AND FIFTH MET RESCTION;  Surgeon: Evertt Hoe, DPM;  Location: [facility] OR;  Service: Orthopedics/Podiatry;  Laterality: Left;   RETINAL DETACHMENT SURGERY     RHINOPLASTY     VASECTOMY     WOUND DEBRIDEMENT Left 03/27/2018   Procedure: DEBRIDEMENT WOUND;  Surgeon: Charity Conch, DPM;  Location: [facility] OR;  Service: Podiatry;  Laterality: Left;   Social History   Occupational History   Occupation: Market researcher: [employer]  Tobacco Use   Smoking status: Former   Smokeless tobacco: Former  Building services engineer status: Never Used  Substance and Sexual Activity   Alcohol use: Not Currently    Comment: Socially   Drug use: No   Sexual activity: Not Currently

## 2023-11-06 ENCOUNTER — Telehealth: Payer: Self-pay | Admitting: Family

## 2023-11-06 ENCOUNTER — Other Ambulatory Visit: Payer: Self-pay | Admitting: Orthopedic Surgery

## 2023-11-06 MED ORDER — OXYCODONE HCL 5 MG PO TABS: 5.0000 mg | ORAL_TABLET | Freq: Four times a day (QID) | ORAL | 0 refills | Status: DC | PRN

## 2023-11-06 NOTE — Telephone Encounter (Signed)
 Pt called stating the rx that was supposed to be sent 11/04/23 for Oxycodone , the pharmacy never received it. Best call back [phone]

## 2023-11-06 NOTE — Telephone Encounter (Signed)
 LM on VM that Rx has been sent in today for him.

## 2023-11-09 ENCOUNTER — Ambulatory Visit: Admitting: Dietician

## 2023-11-17 ENCOUNTER — Encounter: Payer: Self-pay | Admitting: Family

## 2023-11-17 ENCOUNTER — Ambulatory Visit (INDEPENDENT_AMBULATORY_CARE_PROVIDER_SITE_OTHER): Admitting: Family

## 2023-11-17 DIAGNOSIS — Z89512 Acquired absence of left leg below knee: Secondary | ICD-10-CM

## 2023-11-17 NOTE — Progress Notes (Signed)
 Post-Op Visit Note   Patient: [name]           Date of Birth: 1965/08/20           MRN: [id number] Visit Date: 11/17/2023 PCP: Mariella Shore, MD  Chief Complaint:  Chief Complaint  Patient presents with   Left Leg - Routine Post Op    10/16/2023 left BKA     HPI:  HPI The patient is a 58 year old gentleman who is seen status post left below-knee amputation May 16.  He has advanced to a size large compression shrinker and is working with Larinda Plover that Technical sales engineer for prosthesis set up Ortho Exam On examination left residual limb this is consolidating well healing well remaining staples harvested today without incident there were 2 exposed absorbable sutures which are harvested today as well patient tolerated well    Visit Diagnoses: No diagnosis found.  Plan: he will continue with daily Dial soap cleansing shrinker around-the-clock proceed with prosthesis set up.  Follow-Up Instructions: No follow-ups on file.   Imaging: No results found.  Orders:  No orders of the defined types were placed in this encounter.  No orders of the defined types were placed in this encounter.    PMFS History: Patient Active Problem List   Diagnosis Date Noted   Bipolar II disorder (HCC) 10/23/2023   Malnutrition of moderate degree 10/22/2023   Osteomyelitis of left foot (HCC) 10/21/2023   S/P BKA (below knee amputation) unilateral, left (HCC) 10/16/2023   Thrombophlebitis arm 08/03/2023   Hypokalemia 07/30/2023   Subacute osteomyelitis, left ankle and foot (HCC) 07/29/2023   Foot ulcer with fat layer exposed, left (HCC) 11/15/2019   Displaced fracture of shaft of left clavicle, initial encounter for closed fracture 03/15/2019   Dupuytren's contracture 07/29/2018   PICC (peripherally inserted central catheter) in place 05/24/2018   Neuropathy 05/12/2018   Osteomyelitis of ankle or foot, acute, left (HCC) 05/05/2018   Diabetic ulcer of left midfoot associated with type 2 diabetes  mellitus, limited to breakdown of skin (HCC) 12/18/2017   Chronic pain syndrome 11/03/2017   Gastroesophageal reflux disease with esophagitis without hemorrhage 09/17/2017   Esophageal dysphagia    DKA, type 2 (HCC) 07/15/2017   ADD (attention deficit disorder) 07/15/2017   DKA (diabetic ketoacidosis) (HCC) 07/15/2017   Vitamin B12 deficiency (non anemic) 08/13/2016   Adhesive capsulitis of left shoulder 01/18/2016   Trigger ring finger of right hand 10/11/2015   Primary osteoarthritis of first carpometacarpal joint of left hand 06/14/2015   Radial styloid tenosynovitis 06/14/2015   Depression 10/05/2014   Noncompliance with diabetes treatment 10/05/2014   Vitamin D deficiency 09/14/2014   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   Diabetic peripheral neuropathy associated with type 2 diabetes mellitus (HCC) 06/21/2014   Essential hypertension 06/21/2014   Erectile dysfunction associated with type 2 diabetes mellitus (HCC) 06/21/2014   Long-term current use of insulin  for diabetes mellitus (HCC) 06/21/2014   Adhesive capsulitis of right shoulder 03/24/2011   Retinal detachment 12/06/2010   Rotator cuff syndrome 12/06/2010   History of pancreatitis 10/22/2010   Hypertriglyceridemia 10/22/2010   Diabetes mellitus type II, uncontrolled 10/22/2010   Past Medical History:  Diagnosis Date   ADHD (attention deficit hyperactivity disorder)    Anxiety and depression    Candida esophagitis (HCC)    in setting of DKA/hyperglycemia   CIDP (chronic inflammatory demyelinating polyneuropathy) (HCC)    Depression    Diabetes type 2, controlled (HCC)    Diverticulosis  Essential hypertension 06/21/2014   Hyperlipidemia    Kidney stones    Pancreatitis    Stomach ulcer    Vitamin D deficiency     Family History  Problem Relation Age of Onset   Stroke Father    Breast cancer Mother    Colon cancer Neg Hx     Past Surgical History:  Procedure Laterality Date   AMPUTATION Left  10/16/2023   Procedure: LEFT BELOW KNEE AMPUTATION;  Surgeon: Timothy Ford, MD;  Location: [facility] OR;  Service: Orthopedics;  Laterality: Left;  LEFT BELOW KNEE AMPUTATION   ANKLE SURGERY     APPLICATION OF WOUND VAC Left 10/16/2023   Procedure: APPLICATION, WOUND VAC;  Surgeon: Timothy Ford, MD;  Location: [facility] OR;  Service: Orthopedics;  Laterality: Left;   CERVICAL DISC SURGERY     C4-C5   ELBOW SURGERY     ESOPHAGOGASTRODUODENOSCOPY N/A 07/16/2017   Procedure: ESOPHAGOGASTRODUODENOSCOPY (EGD);  Surgeon: Kenney Peacemaker, MD;  [facility] END[facility]COPY;  Service: Endoscopy;  Laterality: N/A;   IRRIGATION AND DEBRIDEMENT FOOT Left 07/31/2023   Procedure: IRRIGATION AND DEBRIDEMENT FOOT AND FIFTH MET RESCTION;  Surgeon: Evertt Hoe, DPM;  Location: [facility] OR;  Service: Orthopedics/Podiatry;  Laterality: Left;   RETINAL DETACHMENT SURGERY     RHINOPLASTY     VASECTOMY     WOUND DEBRIDEMENT Left 03/27/2018   Procedure: DEBRIDEMENT WOUND;  Surgeon: Charity Conch, DPM;  Location: [facility] OR;  Service: Podiatry;  Laterality: Left;   Social History   Occupational History   Occupation: Market researcher:  [employer]  Tobacco Use   Smoking status: Former   Smokeless tobacco: Former  Building services engineer status: Never Used  Substance and Sexual Activity   Alcohol use: Not Currently    Comment: Socially   Drug use: No   Sexual activity: Not Currently

## 2023-11-30 ENCOUNTER — Other Ambulatory Visit: Payer: Self-pay | Admitting: Orthopedic Surgery

## 2023-11-30 ENCOUNTER — Telehealth: Payer: Self-pay | Admitting: Family

## 2023-11-30 ENCOUNTER — Encounter: Attending: Physical Medicine and Rehabilitation | Admitting: Physical Medicine and Rehabilitation

## 2023-11-30 MED ORDER — OXYCODONE HCL 5 MG PO TABS: 5.0000 mg | ORAL_TABLET | Freq: Four times a day (QID) | ORAL | 0 refills | Status: AC | PRN

## 2023-11-30 NOTE — Telephone Encounter (Signed)
 Rx refill Oxycodone    Pharmacy [facility] on [address]

## 2023-11-30 NOTE — Telephone Encounter (Signed)
 Pt is s/p a left BKA 10/16/2023. Asking for refill of Oxycodone  5 mg last refill 11/06/2023 #20

## 2023-12-17 ENCOUNTER — Ambulatory Visit (INDEPENDENT_AMBULATORY_CARE_PROVIDER_SITE_OTHER): Admitting: Podiatry

## 2023-12-17 VITALS — Ht 76.0 in | Wt 214.5 lb

## 2023-12-17 DIAGNOSIS — M216X9 Other acquired deformities of unspecified foot: Secondary | ICD-10-CM

## 2023-12-17 DIAGNOSIS — E119 Type 2 diabetes mellitus without complications: Secondary | ICD-10-CM | POA: Diagnosis not present

## 2023-12-21 NOTE — Progress Notes (Signed)
 Subjective: Chief Complaint  Patient presents with   Foot Pain    Rm 13 Patient is here to have right foot check.( annual health check). No pain or discomfort in the foot.   58 year old male presents the above concerns.  He states that he is doing well after undergoing left amputation.  He presents today for evaluation of his right foot to have it checked.  He has not noticed any issues or any open lesions he has a callus on the side of his foot which does not open or drain.  He feels his Charcot-Marie-Tooth is progressing.  Objective: AAO x3, NAD DP/PT pulses palpable on the right, CRT less than 3 seconds Sensation decreased with Semmes Weinstein monofilament. Right: Nails are mildly elongated.  There is bony prominence noted on the lateral with very minimal callus formation.  There is no open lesions. There is no edema, erythema.  Cavus foot type is present. No pain with calf compression, swelling, warmth, erythema  Assessment: Diabetic foot exam  Plan: -All treatment options discussed with the patient including all alternatives, risks, complications.  -As a courtesy I debrided the nails with any complications or bleeding as they are quite discussed moleskin to help with any bony prominence and continue shoes, offloading.  Continue daily foot inspection with glucose control. -Patient encouraged to call the office with any questions, concerns, change in symptoms.   Return in about 3 months (around 03/18/2024) for foot exam, nail trim .  [name] DPM

## 2023-12-25 ENCOUNTER — Telehealth: Payer: Self-pay | Admitting: Orthopedic Surgery

## 2023-12-25 NOTE — Telephone Encounter (Signed)
 Patient called and [facility] needs a referral for him to get his prosthetics. CB#[phone]

## 2023-12-28 NOTE — Telephone Encounter (Signed)
 Personally faxed to [facility] clinic

## 2024-01-01 ENCOUNTER — Other Ambulatory Visit

## 2024-01-03 NOTE — Progress Notes (Unsigned)
[phone] 

## 2024-01-06 ENCOUNTER — Telehealth: Payer: Self-pay | Admitting: Orthopedic Surgery

## 2024-01-06 ENCOUNTER — Other Ambulatory Visit: Payer: Self-pay

## 2024-01-06 DIAGNOSIS — Z89512 Acquired absence of left leg below knee: Secondary | ICD-10-CM

## 2024-01-06 NOTE — Telephone Encounter (Signed)
 Pt's sister Geofm came in to check in on referral for PT for left leg prosthesis from Dr Harden or [facility] [place]. Please call pt's sister at [phone].

## 2024-01-06 NOTE — Telephone Encounter (Signed)
 I called and advised that order has been sent o [facility] outpatient neuro rehab and to call and sch  appt. Will call with any other questions.

## 2024-01-14 ENCOUNTER — Ambulatory Visit: Attending: Orthopedic Surgery | Admitting: Physical Therapy

## 2024-01-14 ENCOUNTER — Encounter: Payer: Self-pay | Admitting: Physical Therapy

## 2024-01-14 DIAGNOSIS — Z89512 Acquired absence of left leg below knee: Secondary | ICD-10-CM | POA: Diagnosis not present

## 2024-01-14 DIAGNOSIS — R2689 Other abnormalities of gait and mobility: Secondary | ICD-10-CM | POA: Diagnosis present

## 2024-01-14 DIAGNOSIS — M6281 Muscle weakness (generalized): Secondary | ICD-10-CM | POA: Diagnosis present

## 2024-01-14 DIAGNOSIS — G547 Phantom limb syndrome without pain: Secondary | ICD-10-CM | POA: Insufficient documentation

## 2024-01-14 DIAGNOSIS — Z9181 History of falling: Secondary | ICD-10-CM | POA: Insufficient documentation

## 2024-01-14 NOTE — Therapy (Signed)
 OUTPATIENT PHYSICAL THERAPY PROSTHETICS EVALUATION   Patient Name: [name] MRN: [id number] DOB:Jan 07, 1966, 58 y.o., male Today's Date: 01/14/2024  PCP: Erik Nest, MD REFERRING PROVIDER: Harden Jerona GAILS, MD  END OF SESSION:  PT End of Session - 01/14/24 1317     Visit Number 1    Number of Visits 13    Date for PT Re-Evaluation 03/03/24    Authorization Type [employer]    PT Start Time 1315    PT Stop Time 1401    PT Time Calculation (min) 46 min    Equipment Utilized During Treatment Other (comment)   L BKA prosthetic   Activity Tolerance Patient tolerated treatment well    Behavior During Therapy WFL for tasks assessed/performed          Past Medical History:  Diagnosis Date   ADHD (attention deficit hyperactivity disorder)    Anxiety and depression    Candida esophagitis (HCC)    in setting of DKA/hyperglycemia   CIDP (chronic inflammatory demyelinating polyneuropathy) (HCC)    Depression    Diabetes type 2, controlled (HCC)    Diverticulosis    Essential hypertension 06/21/2014   Hyperlipidemia    Kidney stones    Pancreatitis    Stomach ulcer    Vitamin D deficiency    Past Surgical History:  Procedure Laterality Date   AMPUTATION Left 10/16/2023   Procedure: LEFT BELOW KNEE AMPUTATION;  Surgeon: Erik Jerona GAILS, MD;  Location: [facility] OR;  Service: Orthopedics;  Laterality: Left;  LEFT BELOW KNEE AMPUTATION   ANKLE SURGERY     APPLICATION OF WOUND VAC Left 10/16/2023   Procedure: APPLICATION, WOUND VAC;  Surgeon: Erik Jerona GAILS, MD;  Location: [facility] OR;  Service: Orthopedics;  Laterality: Left;   CERVICAL DISC SURGERY     C4-C5   ELBOW SURGERY     ESOPHAGOGASTRODUODENOSCOPY N/A 07/16/2017   Procedure: ESOPHAGOGASTRODUODENOSCOPY (EGD);  Surgeon: Erik Erik BRAVO, MD;  Location: [facility] ENDOSCOPY;  Service: Endoscopy;  Laterality: N/A;   IRRIGATION AND DEBRIDEMENT FOOT Left 07/31/2023   Procedure: IRRIGATION AND DEBRIDEMENT FOOT AND FIFTH MET  RESCTION;  Surgeon: [name], DPM;  Location: [facility] OR;  Service: Orthopedics/Podiatry;  Laterality: Left;   RETINAL DETACHMENT SURGERY     RHINOPLASTY     VASECTOMY     WOUND DEBRIDEMENT Left 03/27/2018   Procedure: DEBRIDEMENT WOUND;  Surgeon: [name], DPM;  Location: [facility] OR;  Service: Podiatry;  Laterality: Left;   Patient Active Problem List   Diagnosis Date Noted   Bipolar II disorder (HCC) 10/23/2023   Malnutrition of moderate degree 10/22/2023   Osteomyelitis of left foot (HCC) 10/21/2023   S/P BKA (below knee amputation) unilateral, left (HCC) 10/16/2023   Thrombophlebitis arm 08/03/2023   Hypokalemia 07/30/2023   Subacute osteomyelitis, left ankle and foot (HCC) 07/29/2023   Foot ulcer with fat layer exposed, left (HCC) 11/15/2019   Displaced fracture of shaft of left clavicle, initial encounter for closed fracture 03/15/2019   Dupuytren's contracture 07/29/2018   PICC (peripherally inserted central catheter) in place 05/24/2018   Neuropathy 05/12/2018   Osteomyelitis of ankle or foot, acute, left (HCC) 05/05/2018   Diabetic ulcer of left midfoot associated with type 2 diabetes mellitus, limited to breakdown of skin (HCC) 12/18/2017   Chronic pain syndrome 11/03/2017   Gastroesophageal reflux disease with esophagitis without hemorrhage 09/17/2017   Esophageal dysphagia    DKA, type 2 (HCC) 07/15/2017   ADD (attention deficit disorder) 07/15/2017  DKA (diabetic ketoacidosis) (HCC) 07/15/2017   Vitamin B12 deficiency (non anemic) 08/13/2016   Adhesive capsulitis of left shoulder 01/18/2016   Trigger ring finger of right hand 10/11/2015   Primary osteoarthritis of first carpometacarpal joint of left hand 06/14/2015   Radial styloid tenosynovitis 06/14/2015   Depression 10/05/2014   Noncompliance with diabetes treatment 10/05/2014   Vitamin D deficiency 09/14/2014   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   Diabetic peripheral neuropathy  associated with type 2 diabetes mellitus (HCC) 06/21/2014   Essential hypertension 06/21/2014   Erectile dysfunction associated with type 2 diabetes mellitus (HCC) 06/21/2014   Long-term current use of insulin  for diabetes mellitus (HCC) 06/21/2014   Adhesive capsulitis of right shoulder 03/24/2011   Retinal detachment 12/06/2010   Rotator cuff syndrome 12/06/2010   History of pancreatitis 10/22/2010   Hypertriglyceridemia 10/22/2010   Diabetes mellitus type II, uncontrolled 10/22/2010    ONSET DATE: 01/06/2024 (referral)   REFERRING DIAG: S10.487 (ICD-10-CM) - S/P BKA (below knee amputation) unilateral, left (HCC)  THERAPY DIAG:  Other abnormalities of gait and mobility  Muscle weakness (generalized)  History of falling  Phantom limb syndrome without pain (HCC)  Rationale for Evaluation and Treatment: Rehabilitation  SUBJECTIVE:   SUBJECTIVE STATEMENT: Pt presents w/RW and BKA socket, states he received it this week. Wearing a 1ply sock currently. Is wearing socket for at least 8-10 hours per day and walked 45 minutes yesterday. Has had a few falls, all when performing squat pivot from bed to [facility] without leg on. Usually occur due to the WC tipping over. Is able to get up on his own but is a struggle.   Was really excited to get his socket but now that he has it, has been an emotional week and is having a tough time motivating himself to do anything. Is not having much pain, but does have a lot of phantom sensation.   Wants to get back to riding his bike and tennis. Also has a goal of walking without AD as quickly as possible.    Pt accompanied by: self  PERTINENT HISTORY: T2DM, CIDP, 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 Pierce), IV antibiotics and hyperbaric tx ([facility]), status post below-knee amputation on the left from May 16  PAIN:  Are you having  pain? No  PRECAUTIONS: Fall  RED FLAGS: None   WEIGHT BEARING RESTRICTIONS: No  FALLS: Has patient fallen in last 6 months? Yes. Number of falls a couple  LIVING ENVIRONMENT: Lives with: lives alone Lives in: House/apartment Home Access: Ramped entrance Home layout: One level Stairs: No Has following equipment at home: Environmental consultant - 2 wheeled, Wheelchair (manual), and Tour manager  PLOF: Independent and Leisure: Tennis and biking  PATIENT GOALS:  I wanna be able to run. I want to go above and beyond what I was doing before.  I would like to walk into the football game with either nothing or a cane (First weekend in September)   OBJECTIVE:  Note: Objective measures were completed at Evaluation unless otherwise noted.  DIAGNOSTIC FINDINGS: None relevant on file   COGNITION: Overall cognitive status: Within functional limits for tasks assessed   SENSATION: Pt reports frequent phantom pain in LLE, most recently sensation is that his L ankle is sore, as if he has rolled his ankle   POSTURE: rounded shoulders  LOWER EXTREMITY ROM:  Active ROM Right eval Left  eval  Hip flexion    Hip extension    Hip abduction    Hip adduction    Hip internal rotation    Hip external rotation    Knee flexion    Knee extension    Ankle dorsiflexion    Ankle plantarflexion    Ankle inversion    Ankle eversion     (Blank rows = not tested)  LOWER EXTREMITY MMT:  MMT Right eval Left eval  Hip flexion    Hip extension    Hip abduction    Hip adduction    Hip internal rotation    Hip external rotation    Knee flexion    Knee extension    Ankle dorsiflexion    Ankle plantarflexion    Ankle inversion    Ankle eversion    (Blank rows = not tested)  BED MOBILITY:  Pt reports independence with this   TRANSFERS: Sit to stand: Modified independence Stand to sit: Modified independence Pt demonstrates significant weight shift to R side w/posterior instability, but is able to  perform without AD   RAMP: Not assessed on eval   CURB: Not assessed on eval   GAIT: Gait pattern: step through pattern, decreased step length- Left, decreased stride length, lateral hip instability, and trunk flexed Distance walked: Various short clinic distances  Assistive device utilized: Walker - 2 wheeled Level of assistance: Modified independence Comments: Pt tends to look down at feet, resulting in lack of hip extension on L side. Min cues to maintain forward gaze and noted improved anterior translation of pelvis on L side. Pt demonstrates step-through pattern w/good heel strike on L side   FUNCTIONAL TESTS:   [facility] PT Assessment - 01/14/24 1355       Transfers   Five time sit to stand comments  20.66s w/BUE support   Weight shift to R          CURRENT PROSTHETIC WEAR ASSESSMENT: Patient is independent with: skin check, residual limb care, care of non-amputated limb, prosthetic cleaning, ply sock cleaning, and correct ply sock adjustment Patient is dependent with: proper wear schedule/adjustment and proper weight-bearing schedule/adjustment Donning prosthesis: Complete Independence Doffing prosthesis: Complete Independence Prosthetic wear tolerance: 8-10 hours/day, 7 days/week Prosthetic weight bearing tolerance: >45 minutes Residual limb condition: Intact residual limb w/well-healed incision, no adhesions noted w/good compliance to wearing shrinker. Pt w/long residual limb Prosthetic description: PTB BKA prosthetic w/Pin lock  K code/activity level with prosthetic use: Level 3                                                                                                                TREATMENT:    TREATMENT:  PATIENT EDUCATED ON FOLLOWING PROSTHETIC CARE:- Prosthetic wear tolerance: 8-10 hours/day, 7 days/week  Prosthetic weight bearing tolerance: >45 minutes Other education  Skin check, Correct ply sock adjustment, and Comments using spray deodorant to manage  sweating of residual limb. Educated pt on normalcy w/edema in limb and cues to use to know to add more  socks (bottoming in socket, socket rotating, being able to place thumb between patellar notch and leg). Practiced proper donning of liner as pt has a hard time lining up pin due to length of residual limb. Cued pt to use line on liner to dissect patella and bend knee when initially placing liner in order to ensure pin is straight.    PATIENT EDUCATION: Education details: POC, eval findings, see treatment above  Person educated: Patient Education method: Explanation, Demonstration, Tactile cues, and Verbal cues Education comprehension: verbalized understanding, returned demonstration, verbal cues required, tactile cues required, and needs further education  HOME EXERCISE PROGRAM: To be established   ASSESSMENT:  CLINICAL IMPRESSION: Eval limited as pt emotional during subjective and had several questions regarding residual limb care and sock management. Patient is a 58 year old male referred to Neuro OPPT for L BKA.  Pt's PMH is significant for: T2DM, CIDP, 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 Pierce), IV antibiotics and hyperbaric tx ([facility]). The following deficits were present during the exam: improper gait mechanics, impaired sensation, phantom limb sensation and impaired balance. Based on falls history and recent BKA, pt is an incr risk for falls. Pt would benefit from skilled PT to address these impairments and functional limitations to maximize functional mobility independence.   OBJECTIVE IMPAIRMENTS: Abnormal gait, decreased activity tolerance, decreased balance, decreased endurance, decreased mobility, difficulty walking, decreased strength, impaired sensation, improper body mechanics, prosthetic dependency , and pain  ACTIVITY LIMITATIONS: carrying, lifting,  bending, squatting, stairs, transfers, bed mobility, bathing, dressing, reach over head, hygiene/grooming, and locomotion level  PARTICIPATION LIMITATIONS: shopping, community activity, occupation, and yard work  PERSONAL FACTORS: Past/current experiences and 1-2 comorbidities: CMT and L BKA are also affecting patient's functional outcome.   REHAB POTENTIAL: Excellent  CLINICAL DECISION MAKING: Evolving/moderate complexity  EVALUATION COMPLEXITY: Moderate   GOALS: Goals reviewed with patient? Yes  SHORT TERM GOALS: Target date: 02/11/2024   Pt will be independent with initial HEP for improved strength, balance, transfers and gait. Baseline: not established on eval  Goal status: INITIAL  2.  Gait speed to be assessed and STG/LTG updated  Baseline:  Goal status: INITIAL  3.  Pt will perform floor transfer w/CGA w/L prosthetic donned for improved fall recovery and safety at home  Baseline:  Goal status: INITIAL  4.  to be assessed and LTG updated  Baseline:  Goal status: INITIAL   LONG TERM GOALS: Target date: 02/25/2024    Pt will be independent with final HEP for improved strength, balance, transfers and gait.  Baseline:  Goal status: INITIAL  2.  Gait speed goal  Baseline:  Goal status: INITIAL  3.  goal  Baseline:  Goal status: INITIAL  4.  Pt will perform floor transfer mod I  w/L prosthetic donned for improved fall recovery and safety at home  Baseline:  Goal status: INITIAL   PLAN:  PT FREQUENCY: 2x/week  PT DURATION: 6 weeks  PLANNED INTERVENTIONS: 97164- PT Re-evaluation, 97750- Physical Performance Testing, 97110-Therapeutic exercises, 97530- Therapeutic activity, [id number]- Neuromuscular re-education, 97535- Self Care, [id number]- Manual therapy, [id number]- Gait training, [phone]- Orthotic/Prosthetic subsequent, [phone]- Aquatic Therapy, [phone]- Electrical stimulation (manual), [phone] (1-2 muscles), [phone] (3+ muscles)- Dry Needling, Patient/Family  education, Balance training, Stair training, Joint mobilization, Spinal mobilization, Vestibular training, and DME instructions  PLAN FOR NEXT SESSION: Gait speed and update goal. Establish high level  HEP for weightbearing tolerance on LLE, glute strength and hip extension to assist w/gait. Stairs, walking w/reduced support on AD, walking on unlevel surfaces   Check all possible CPT codes: See Planned Interventions List for Planned CPT Codes    Check all conditions that are expected to impact treatment: Structural or anatomic abnormalities, Neurological condition and/or seizures, and Psychological or psychiatric disorders   If treatment provided at initial evaluation, no treatment charged due to lack of authorization.     Tenesia Escudero E Itamar Mcgowan, PT, DPT 01/14/2024, 4:28 PM

## 2024-01-19 ENCOUNTER — Ambulatory Visit: Admitting: Physical Therapy

## 2024-01-19 DIAGNOSIS — R2689 Other abnormalities of gait and mobility: Secondary | ICD-10-CM

## 2024-01-19 DIAGNOSIS — G547 Phantom limb syndrome without pain: Secondary | ICD-10-CM

## 2024-01-19 DIAGNOSIS — M6281 Muscle weakness (generalized): Secondary | ICD-10-CM

## 2024-01-19 NOTE — Therapy (Addendum)
 OUTPATIENT PHYSICAL THERAPY PROSTHETICS TREATMENT   Patient Name: [name] MRN: [id number] DOB:[birth year], 58 y.o., male Today's Date: 01/19/2024  PCP: Fritzi Nest, MD REFERRING PROVIDER: Harden Jerona GAILS, MD  END OF SESSION:  PT End of Session - 01/19/24 0934     Visit Number 2    Number of Visits 13    Date for PT Re-Evaluation 03/03/24    Authorization Type [employer]    PT Start Time 0932    PT Stop Time 1018    PT Time Calculation (min) 46 min    Equipment Utilized During Treatment Other (comment);Gait belt   L BKA prosthetic   Activity Tolerance Patient tolerated treatment well    Behavior During Therapy WFL for tasks assessed/performed          Past Medical History:  Diagnosis Date   ADHD (attention deficit hyperactivity disorder)    Anxiety and depression    Candida esophagitis (HCC)    in setting of DKA/hyperglycemia   CIDP (chronic inflammatory demyelinating polyneuropathy) (HCC)    Depression    Diabetes type 2, controlled (HCC)    Diverticulosis    Essential hypertension 06/21/2014   Hyperlipidemia    Kidney stones    Pancreatitis    Stomach ulcer    Vitamin D deficiency    Past Surgical History:  Procedure Laterality Date   AMPUTATION Left 10/16/2023   Procedure: LEFT BELOW KNEE AMPUTATION;  Surgeon: Harden Jerona GAILS, MD;  Location: [facility] OR;  Service: Orthopedics;  Laterality: Left;  LEFT BELOW KNEE AMPUTATION   ANKLE SURGERY     APPLICATION OF WOUND VAC Left 10/16/2023   Procedure: APPLICATION, WOUND VAC;  Surgeon: Harden Jerona GAILS, MD;  Location: [facility] OR;  Service: Orthopedics;  Laterality: Left;   CERVICAL DISC SURGERY     C4-C5   ELBOW SURGERY     ESOPHAGOGASTRODUODENOSCOPY N/A 07/16/2017   Procedure: ESOPHAGOGASTRODUODENOSCOPY (EGD);  Surgeon: Avram Lupita BRAVO, MD;  Location: [facility] [place] ENDOSCOPY;  Service: Endoscopy;  Laterality: N/A;   IRRIGATION AND DEBRIDEMENT FOOT Left 07/31/2023   Procedure: IRRIGATION AND DEBRIDEMENT FOOT AND FIFTH  MET RESCTION;  Surgeon: Malvin Marsa FALCON, DPM;  Location: [facility] OR;  Service: Orthopedics/Podiatry;  Laterality: Left;   RETINAL DETACHMENT SURGERY     RHINOPLASTY     VASECTOMY     WOUND DEBRIDEMENT Left 03/27/2018   Procedure: DEBRIDEMENT WOUND;  Surgeon: Gershon Donnice SAUNDERS, DPM;  Location: [facility] OR;  Service: Podiatry;  Laterality: Left;   Patient Active Problem List   Diagnosis Date Noted   Bipolar II disorder (HCC) 10/23/2023   Malnutrition of moderate degree 10/22/2023   Osteomyelitis of left foot (HCC) 10/21/2023   S/P BKA (below knee amputation) unilateral, left (HCC) 10/16/2023   Thrombophlebitis arm 08/03/2023   Hypokalemia 07/30/2023   Subacute osteomyelitis, left ankle and foot (HCC) 07/29/2023   Foot ulcer with fat layer exposed, left (HCC) 11/15/2019   Displaced fracture of shaft of left clavicle, initial encounter for closed fracture 03/15/2019   Dupuytren's contracture 07/29/2018   PICC (peripherally inserted central catheter) in place 05/24/2018   Neuropathy 05/12/2018   Osteomyelitis of ankle or foot, acute, left (HCC) 05/05/2018   Diabetic ulcer of left midfoot associated with type 2 diabetes mellitus, limited to breakdown of skin (HCC) 12/18/2017   Chronic pain syndrome 11/03/2017   Gastroesophageal reflux disease with esophagitis without hemorrhage 09/17/2017   Esophageal dysphagia    DKA, type 2 (HCC) 07/15/2017   ADD (attention deficit disorder) 07/15/2017  DKA (diabetic ketoacidosis) (HCC) 07/15/2017   Vitamin B12 deficiency (non anemic) 08/13/2016   Adhesive capsulitis of left shoulder 01/18/2016   Trigger ring finger of right hand 10/11/2015   Primary osteoarthritis of first carpometacarpal joint of left hand 06/14/2015   Radial styloid tenosynovitis 06/14/2015   Depression 10/05/2014   Noncompliance with diabetes treatment 10/05/2014   Vitamin D deficiency 09/14/2014   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   Diabetic peripheral neuropathy  associated with type 2 diabetes mellitus (HCC) 06/21/2014   Essential hypertension 06/21/2014   Erectile dysfunction associated with type 2 diabetes mellitus (HCC) 06/21/2014   Long-term current use of insulin  for diabetes mellitus (HCC) 06/21/2014   Adhesive capsulitis of right shoulder 03/24/2011   Retinal detachment 12/06/2010   Rotator cuff syndrome 12/06/2010   History of pancreatitis 10/22/2010   Hypertriglyceridemia 10/22/2010   Diabetes mellitus type II, uncontrolled 10/22/2010    ONSET DATE: 01/06/2024 (referral)   REFERRING DIAG: S10.487 (ICD-10-CM) - S/P BKA (below knee amputation) unilateral, left (HCC)  THERAPY DIAG:  Other abnormalities of gait and mobility  Muscle weakness (generalized)  Phantom limb syndrome without pain (HCC)  Rationale for Evaluation and Treatment: Rehabilitation  SUBJECTIVE:   SUBJECTIVE STATEMENT: Pt presents w/RW and BKA socket, wearing 4 ply socks currently. Reports he overdid things over the weekend, went out with his son and used a cane instead of his RW. Thinks he may have bruised his distal limb. Had a lot of phantom sensation w/cane, felt a lot of pressure in his L foot.   Sees [facility] next week (8/27).    Pt accompanied by: self  PERTINENT HISTORY: T2DM, CIDP, 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 Pierce), IV antibiotics and hyperbaric tx ([facility]), status post below-knee amputation on the left from May 16  PAIN:  Are you having pain? No  PRECAUTIONS: Fall  RED FLAGS: None   WEIGHT BEARING RESTRICTIONS: No  FALLS: Has patient fallen in last 6 months? Yes. Number of falls a couple  LIVING ENVIRONMENT: Lives with: lives alone Lives in: House/apartment Home Access: Ramped entrance Home layout: One level Stairs: No Has following equipment at home: Environmental consultant - 2 wheeled, Wheelchair (manual), and Nurse, mental health  PLOF: Independent and Leisure: Tennis and biking  PATIENT GOALS:  I wanna be able to run. I want to go above and beyond what I was doing before.  I would like to walk into the football game with either nothing or a cane (First weekend in September)   OBJECTIVE:  Note: Objective measures were completed at Evaluation unless otherwise noted.  DIAGNOSTIC FINDINGS: None relevant on file   COGNITION: Overall cognitive status: Within functional limits for tasks assessed   SENSATION: Pt reports frequent phantom pain in LLE, most recently sensation is that his L ankle is sore, as if he has rolled his ankle   POSTURE: rounded shoulders  LOWER EXTREMITY ROM:  Active ROM Right eval Left eval  Hip flexion    Hip extension    Hip abduction    Hip adduction    Hip internal rotation    Hip external rotation    Knee flexion    Knee extension    Ankle dorsiflexion    Ankle plantarflexion    Ankle inversion    Ankle eversion     (Blank rows = not tested)  LOWER EXTREMITY MMT:  MMT Right eval Left  eval  Hip flexion    Hip extension    Hip abduction    Hip adduction    Hip internal rotation    Hip external rotation    Knee flexion    Knee extension    Ankle dorsiflexion    Ankle plantarflexion    Ankle inversion    Ankle eversion    (Blank rows = not tested)  BED MOBILITY:  Pt reports independence with this   TRANSFERS: Sit to stand: Modified independence Stand to sit: Modified independence Pt demonstrates significant weight shift to R side w/posterior instability, but is able to perform without AD   RAMP: Not assessed on eval   CURB: Not assessed on eval   GAIT:  Gait pattern: step through pattern and lateral hip instability Distance walked: Various short clinic distances Assistive device utilized: Environmental consultant - 2 wheeled Level of assistance: Modified independence Comments: Pt entered and exited clinic w/RW, no instability noted     FUNCTIONAL TESTS:       CURRENT PROSTHETIC WEAR ASSESSMENT: Patient is independent with: skin check, residual limb care, care of non-amputated limb, prosthetic cleaning, ply sock cleaning, and correct ply sock adjustment Patient is dependent with: proper wear schedule/adjustment and proper weight-bearing schedule/adjustment Donning prosthesis: Complete Independence Doffing prosthesis: Complete Independence Prosthetic wear tolerance: 8-10 hours/day, 7 days/week Prosthetic weight bearing tolerance: >45 minutes Residual limb condition: Intact residual limb w/well-healed incision, no adhesions noted w/good compliance to wearing shrinker. Pt w/long residual limb Prosthetic description: PTB BKA prosthetic w/Pin lock  K code/activity level with prosthetic use: Level 3                                                                                                                TREATMENT:  Ther Act  Assessed L residual limb and did not note any bruising, but pt is TTP along lateral distal limb where he has a bony prominence and noted some blanchable redness in this area. Recommended pt tell [facility] about this as he may need a modification to his socket to reduce friction here.  Placed plastic liner back into socket and pt able to get leg into socket w/liner only, 3 clicks heard in sitting. Continued to educate pt on fluctuating residual limb size and trying to perform gentle exercises first thing in the AM (LAQ, marches) to attempt in reducing fluid in limb and properly fit in socket w/plastic liner. However, if he is unable to, pt knows to remove plastic liner and add socks prn.   Gait pattern: step through pattern, decreased stance time- Left, trendelenburg, lateral hip instability, and lateral lean- Left Distance walked: >300' around clinic  Assistive device utilized: Single point cane Level of assistance: SBA Comments: Pt demonstrates almost symmetrical step-through pattern w/minor compensated trendelenburg on L  side. Pt w/improved forward gaze today. Pt demonstrates the most instability w/turns and immediate standing balance   RAMP:  Level of Assistance: SBA Assistive device utilized: Single point cane Ramp Comments: Good eccentric control of LLE noted   CURB:  Level of Assistance: CGA Assistive device utilized: Single point cane Curb Comments: Pt w/difficulty stabilizing on LLE when descending curb due to decreased eccentric control. On ascent, pt lead w/LLE and his L knee buckled but pt was able to prevent a fall independently   Established initial HEP for improved glute med strength, reduction of truncal lean compensation, weightbearing tolerance on LLE and functional hip stability:  At ballet bar:  Glute med kickbacks w/red theraband around distal quads, 2x8 reps per side w/BUE support. Min cues to reduce truncal lean compensation throughout. Increased difficulty stabilizing on LLE  Lateral monster walks w/red theraband around distal quads, x4 reps each direction w/min cues to maintain wide BOS and tension on theraband.  Quadruped bird dogs on mat table w/L BKA prosthetic donned, x8 reps per side. Increased difficulty stabilizing on LLE, but no overt LOB noted    PATIENT EDUCATION: Education details: Initial HEP, continued education on edema management, encouraged pt to bring his cane with him to next session  Person educated: Patient Education method: Explanation, Demonstration, Tactile cues, Verbal cues, and Handouts Education comprehension: verbalized understanding, returned demonstration, verbal cues required, tactile cues required, and needs further education  HOME EXERCISE PROGRAM: Access Code: [email or web address] URL: [email or web address] Date: 01/19/2024 Prepared by: Marlon Dennette Faulconer  Exercises - Diagonal Hip Extension with Resistance  - 1 x daily - 7 x weekly - 3 sets - 10 reps - Bird Dog  - 1 x daily - 7 x weekly - 3 sets - 10 reps - Side Stepping with Resistance at Thighs  and Counter Support  - 1 x daily - 7 x weekly - 3 sets - 10 reps  ASSESSMENT:  CLINICAL IMPRESSION: Emphasis of skilled PT session on gait training w/SPC, pt education regarding residual limb and socket management and establishing initial HEP. Pt reports he has been walking more with his SPC at home and in the community, which has triggered more phantom sensation in his LLE. Assessed pt's residual limb for bruising but did not note any, just blanchable redness along distal lateral aspect of limb due to bony prominence. Encouraged pt to speak to prosthetist about this. Pt demonstrates functional glute med weakness and compensated trendelenburg w/gait, but has adequate step-through pattern w/heel strike and good hip extension in stance. Established HEP to work on [employer] strength as well as single leg stability, which pt performed well w/no pain reported. Continue POC.   OBJECTIVE IMPAIRMENTS: Abnormal gait, decreased activity tolerance, decreased balance, decreased endurance, decreased mobility, difficulty walking, decreased strength, impaired sensation, improper body mechanics, prosthetic dependency , and pain  ACTIVITY LIMITATIONS: carrying, lifting, bending, squatting, stairs, transfers, bed mobility, bathing, dressing, reach over head, hygiene/grooming, and locomotion level  PARTICIPATION LIMITATIONS: shopping, community activity, occupation, and yard work  PERSONAL FACTORS: Past/current experiences and 1-2 comorbidities: CMT and L BKA are also affecting patient's functional outcome.   REHAB POTENTIAL: Excellent  CLINICAL DECISION MAKING: Evolving/moderate complexity  EVALUATION COMPLEXITY: Moderate   GOALS: Goals reviewed with patient? Yes  SHORT TERM GOALS: Target date: 02/11/2024   Pt will be independent with initial HEP for improved strength, balance, transfers and gait. Baseline: not established on eval  Goal status: INITIAL  2.  Gait speed to be assessed and STG/LTG  updated  Baseline:  Goal status: INITIAL  3.  Pt will perform floor transfer w/CGA w/L prosthetic donned for improved fall recovery and safety at home  Baseline:  Goal status: INITIAL  4.  to be assessed and LTG updated  Baseline:  Goal status: INITIAL   LONG TERM GOALS: Target date: 02/25/2024    Pt will be independent with final HEP for improved strength, balance, transfers and gait.  Baseline:  Goal status: INITIAL  2.  Gait speed goal  Baseline:  Goal status: INITIAL  3.  goal  Baseline:  Goal status: INITIAL  4.  Pt will perform floor transfer mod I  w/L prosthetic donned for improved fall recovery and safety at home  Baseline:  Goal status: INITIAL   PLAN:  PT FREQUENCY: 2x/week  PT DURATION: 6 weeks  PLANNED INTERVENTIONS: 97164- PT Re-evaluation, 97750- Physical Performance Testing, 97110-Therapeutic exercises, 97530- Therapeutic activity, [id number]- Neuromuscular re-education, 97535- Self Care, [id number]- Manual therapy, [id number]- Gait training, [phone]- Orthotic/Prosthetic subsequent, [phone]- Aquatic Therapy, [phone]- Electrical stimulation (manual), [phone] (1-2 muscles), [phone] (3+ muscles)- Dry Needling, Patient/Family education, Balance training, Stair training, Joint mobilization, Spinal mobilization, Vestibular training, and DME instructions  PLAN FOR NEXT SESSION: Gait speed and update goal. Add to high level HEP for weightbearing tolerance on LLE, glute strength and hip extension to assist w/gait. Stairs, walking w/reduced support on AD, walking on unlevel surfaces, staggered sit to stands, RDLs, core stability   Check all possible CPT codes: See Planned Interventions List for Planned CPT Codes    Check all conditions that are expected to impact treatment: Structural or anatomic abnormalities, Neurological condition and/or seizures, and Psychological or psychiatric disorders   If treatment provided at initial evaluation, no treatment charged due to lack of  authorization.     Zakk Borgen E Justyne Roell, PT, DPT 01/19/2024, 10:37 AM

## 2024-01-22 ENCOUNTER — Ambulatory Visit: Admitting: Physical Therapy

## 2024-01-22 ENCOUNTER — Encounter: Payer: Self-pay | Admitting: Physical Therapy

## 2024-01-22 DIAGNOSIS — Z9181 History of falling: Secondary | ICD-10-CM

## 2024-01-22 DIAGNOSIS — M6281 Muscle weakness (generalized): Secondary | ICD-10-CM

## 2024-01-22 DIAGNOSIS — G547 Phantom limb syndrome without pain: Secondary | ICD-10-CM

## 2024-01-22 DIAGNOSIS — R2689 Other abnormalities of gait and mobility: Secondary | ICD-10-CM

## 2024-01-22 NOTE — Therapy (Signed)
 OUTPATIENT PHYSICAL THERAPY PROSTHETICS TREATMENT   Patient Name: [name] MRN: [id number] DOB:02-Sep-1965, 58 y.o., male Today's Date: 01/22/2024  PCP: Fritzi Nest, MD REFERRING PROVIDER: Harden Jerona GAILS, MD  END OF SESSION:  PT End of Session - 01/22/24 1331     Visit Number 3    Number of Visits 13    Date for PT Re-Evaluation 03/03/24    Authorization Type [employer]    PT Start Time 1317    PT Stop Time 1402    PT Time Calculation (min) 45 min    Equipment Utilized During Treatment Other (comment);Gait belt   L BKA prosthetic   Activity Tolerance Patient tolerated treatment well    Behavior During Therapy WFL for tasks assessed/performed          Past Medical History:  Diagnosis Date   ADHD (attention deficit hyperactivity disorder)    Anxiety and depression    Candida esophagitis (HCC)    in setting of DKA/hyperglycemia   CIDP (chronic inflammatory demyelinating polyneuropathy) (HCC)    Depression    Diabetes type 2, controlled (HCC)    Diverticulosis    Essential hypertension 06/21/2014   Hyperlipidemia    Kidney stones    Pancreatitis    Stomach ulcer    Vitamin D deficiency    Past Surgical History:  Procedure Laterality Date   AMPUTATION Left 10/16/2023   Procedure: LEFT BELOW KNEE AMPUTATION;  Surgeon: Harden Jerona GAILS, MD;  Location: [facility] OR;  Service: Orthopedics;  Laterality: Left;  LEFT BELOW KNEE AMPUTATION   ANKLE SURGERY     APPLICATION OF WOUND VAC Left 10/16/2023   Procedure: APPLICATION, WOUND VAC;  Surgeon: Harden Jerona GAILS, MD;  Location: [facility] OR;  Service: Orthopedics;  Laterality: Left;   CERVICAL DISC SURGERY     C4-C5   ELBOW SURGERY     ESOPHAGOGASTRODUODENOSCOPY N/A 07/16/2017   Procedure: ESOPHAGOGASTRODUODENOSCOPY (EGD);  Surgeon: Avram Lupita BRAVO, MD;  Location[facility] ENDOSCOPY;  Service: Endoscopy;  Laterality: N/A;   IRRIGATION AND DEBRIDEMENT FOOT Left 07/31/2023   Procedure: IRRIGATION AND DEBRIDEMENT FOOT AND FIFTH  MET RESCTION;  Surgeon: Malvin Marsa FALCON, DPM;  Location: [facility] OR;  Service: Orthopedics/Podiatry;  Laterality: Left;   RETINAL DETACHMENT SURGERY     RHINOPLASTY     VASECTOMY     WOUND DEBRIDEMENT Left 03/27/2018   Procedure: DEBRIDEMENT WOUND;  Surgeon: Gershon Donnice SAUNDERS, DPM;  Location: [facility] OR;  Service: Podiatry;  Laterality: Left;   Patient Active Problem List   Diagnosis Date Noted   Bipolar II disorder (HCC) 10/23/2023   Malnutrition of moderate degree 10/22/2023   Osteomyelitis of left foot (HCC) 10/21/2023   S/P BKA (below knee amputation) unilateral, left (HCC) 10/16/2023   Thrombophlebitis arm 08/03/2023   Hypokalemia 07/30/2023   Subacute osteomyelitis, left ankle and foot (HCC) 07/29/2023   Foot ulcer with fat layer exposed, left (HCC) 11/15/2019   Displaced fracture of shaft of left clavicle, initial encounter for closed fracture 03/15/2019   Dupuytren's contracture 07/29/2018   PICC (peripherally inserted central catheter) in place 05/24/2018   Neuropathy 05/12/2018   Osteomyelitis of ankle or foot, acute, left (HCC) 05/05/2018   Diabetic ulcer of left midfoot associated with type 2 diabetes mellitus, limited to breakdown of skin (HCC) 12/18/2017   Chronic pain syndrome 11/03/2017   Gastroesophageal reflux disease with esophagitis without hemorrhage 09/17/2017   Esophageal dysphagia    DKA, type 2 (HCC) 07/15/2017   ADD (attention deficit disorder) 07/15/2017  DKA (diabetic ketoacidosis) (HCC) 07/15/2017   Vitamin B12 deficiency (non anemic) 08/13/2016   Adhesive capsulitis of left shoulder 01/18/2016   Trigger ring finger of right hand 10/11/2015   Primary osteoarthritis of first carpometacarpal joint of left hand 06/14/2015   Radial styloid tenosynovitis 06/14/2015   Depression 10/05/2014   Noncompliance with diabetes treatment 10/05/2014   Vitamin D deficiency 09/14/2014   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   Diabetic peripheral neuropathy  associated with type 2 diabetes mellitus (HCC) 06/21/2014   Essential hypertension 06/21/2014   Erectile dysfunction associated with type 2 diabetes mellitus (HCC) 06/21/2014   Long-term current use of insulin  for diabetes mellitus (HCC) 06/21/2014   Adhesive capsulitis of right shoulder 03/24/2011   Retinal detachment 12/06/2010   Rotator cuff syndrome 12/06/2010   History of pancreatitis 10/22/2010   Hypertriglyceridemia 10/22/2010   Diabetes mellitus type II, uncontrolled 10/22/2010    ONSET DATE: 01/06/2024 (referral)   REFERRING DIAG: S10.487 (ICD-10-CM) - S/P BKA (below knee amputation) unilateral, left (HCC)  THERAPY DIAG:  Other abnormalities of gait and mobility  Muscle weakness (generalized)  Phantom limb syndrome without pain (HCC)  History of falling  Rationale for Evaluation and Treatment: Rehabilitation  SUBJECTIVE:   SUBJECTIVE STATEMENT: Pt presents w/RW and BKA socket, wearing 1 ply socks currently. He continues to have phantom pains in the ball of the left foot.  He wants to walk into the [employer] game in a few weeks with just his cane, but does not feel he is there yet.  Going out in public with 2WW is difficult.  Sees [facility] next week (8/27).    Pt accompanied by: self  PERTINENT HISTORY: T2DM, CIDP, 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 Pierce), IV antibiotics and hyperbaric tx ([facility]), status post below-knee amputation on the left from May 16  PAIN:  Are you having pain? Yes: NPRS scale: 4-5 Pain location: left ball of foot Pain description: phantom pain Aggravating factors: constant Relieving factors: unsure  PRECAUTIONS: Fall  RED FLAGS: None   WEIGHT BEARING RESTRICTIONS: No  FALLS: Has patient fallen in last 6 months? Yes. Number of falls a couple  LIVING ENVIRONMENT: Lives with: lives alone Lives in:  House/apartment Home Access: Ramped entrance Home layout: One level Stairs: No Has following equipment at home: Environmental consultant - 2 wheeled, Wheelchair (manual), and Tour manager  PLOF: Independent and Leisure: Tennis and biking  PATIENT GOALS:  I wanna be able to run. I want to go above and beyond what I was doing before.  I would like to walk into the football game with either nothing or a cane (First weekend in September)   OBJECTIVE:  Note: Objective measures were completed at Evaluation unless otherwise noted.  DIAGNOSTIC FINDINGS: None relevant on file   COGNITION: Overall cognitive status: Within functional limits for tasks assessed   SENSATION: Pt reports frequent phantom pain in LLE, most recently sensation is that his L ankle is sore, as if he has rolled his ankle   POSTURE: rounded shoulders  LOWER EXTREMITY ROM:  Active ROM Right eval Left eval  Hip flexion    Hip extension    Hip abduction    Hip adduction    Hip internal rotation    Hip external rotation    Knee flexion    Knee extension    Ankle dorsiflexion    Ankle plantarflexion  Ankle inversion    Ankle eversion     (Blank rows = not tested)  LOWER EXTREMITY MMT:  MMT Right eval Left eval  Hip flexion    Hip extension    Hip abduction    Hip adduction    Hip internal rotation    Hip external rotation    Knee flexion    Knee extension    Ankle dorsiflexion    Ankle plantarflexion    Ankle inversion    Ankle eversion    (Blank rows = not tested)  BED MOBILITY:  Pt reports independence with this   TRANSFERS: Sit to stand: Modified independence Stand to sit: Modified independence Pt demonstrates significant weight shift to R side w/posterior instability, but is able to perform without AD   RAMP: Not assessed on eval   CURB: Not assessed on eval   GAIT:  Gait pattern: step through pattern and lateral hip instability Distance walked: Various short clinic distances Assistive device  utilized: Environmental consultant - 2 wheeled Level of assistance: Modified independence Comments: Pt entered and exited clinic w/RW, no instability noted     FUNCTIONAL TESTS:      CURRENT PROSTHETIC WEAR ASSESSMENT: Patient is independent with: skin check, residual limb care, care of non-amputated limb, prosthetic cleaning, ply sock cleaning, and correct ply sock adjustment Patient is dependent with: proper wear schedule/adjustment and proper weight-bearing schedule/adjustment Donning prosthesis: Complete Independence Doffing prosthesis: Complete Independence Prosthetic wear tolerance: 8-10 hours/day, 7 days/week Prosthetic weight bearing tolerance: >45 minutes Residual limb condition: Intact residual limb w/well-healed incision, no adhesions noted w/good compliance to wearing shrinker. Pt w/long residual limb Prosthetic description: PTB BKA prosthetic w/Pin lock  K code/activity level with prosthetic use: Level 3                                                                                                                TREATMENT:  Ther Act/Prosthetic Management Assessed L residual limb and did not note any bruising, but pt is TTP along lateral distal limb where he has a bony prominence - no redness this date.  Discussed sock ply adjustment and clicks into socket for security vs bottoming out.  Encouraged walking w/ 2ww as much as possible to build stamina and help manage limb shaping/edema.  Edu on shrinker vs liner wear for compression and wear schedule to prevent high volumes of fluctuation.  Expected timeline for limb shaping/fluid management. :  16.31 sec w/ 2WW = 0.61 m/sec OR 2.02 ft/sec Desensitization techniques: Desensitization Techniques: -Light touch/pressure:  using fingertip pressure to slowly move up small segments of the affected body part until outside the area of pain and then back to where you started -Deep pressure:  using fingertips to squeeze in small segments starting  outside the affected area, crossing over the affected area, and then to the other side of the affected area -Tapping:  fingertips tap with firm pressure over the affected area, can move around the affected area as well -Brushing/scratching:  use fingertips to brush/scratch over and  around the affected area -Textures:  use soft and rough textured items like cotton balls vs wash cloths to brush or rub with light into firm pressure over and around the affected area  Repeat these 3-4x per day.  Gait: Gait pattern: step through pattern, decreased stance time- Left, trendelenburg, lateral hip instability, and lateral lean- Left Distance walked: 250' around clinic  Assistive device utilized: Single point cane Level of assistance: SBA Comments: Pt has more asymmetry of gait today w/ LLE kicked into abduction to offload lateral distal residual limb.  No LOB and pt able to manage clinic obstacles and narrowed pathways well.  Discussed ways to work on stride at home w/ 2WW or cane option.  -x8 6 stairs SBA using Bil and reciprocal pattern, reports increased pressure in step up with LLE in same lateral fibular area.  PATIENT EDUCATION: Education details: Continue initial HEP w/ addition today, continued education on edema management, encouraged pt to bring his cane with him to next session, daily skin checks due to pain and sensory changes.  Sweat management of residual limb and cleaning liner to prevent fungal infections or other issues are scar invagination. Person educated: Patient Education method: Explanation, Demonstration, Tactile cues, Verbal cues, and Handouts Education comprehension: verbalized understanding, returned demonstration, verbal cues required, tactile cues required, and needs further education  HOME EXERCISE PROGRAM: Access Code: [email or web address] URL: [email or web address] Date: 01/19/2024 Prepared by: Marlon Plaster  Exercises - Diagonal Hip Extension with Resistance  -  1 x daily - 7 x weekly - 3 sets - 10 reps - Bird Dog  - 1 x daily - 7 x weekly - 3 sets - 10 reps - Side Stepping with Resistance at Thighs and Counter Support  - 1 x daily - 7 x weekly - 3 sets - 10 reps -Step taps at sink - Supine 90/90 Alternating Heel Touches with Posterior Pelvic Tilt  - 1 x daily - 7 x weekly - 3 sets - 10 reps  ASSESSMENT:  CLINICAL IMPRESSION: Emphasis of skilled session today on continuing to address prosthetic education and progression to cane use vs current 2WW.  He has persistent phantom pain and lateral fibular friction at distal residual limb.  No noted skin issues on assessment today, but pt would benefit from continued follow-up of this until [facility] appt where possible prosthetic modifications can be made to better support this portion of limb.  PT feels that with possible prosthetic modification this may improve his ambulatory stability as certain mechanics today were done in attempt to offload affected area. Continue POC.   OBJECTIVE IMPAIRMENTS: Abnormal gait, decreased activity tolerance, decreased balance, decreased endurance, decreased mobility, difficulty walking, decreased strength, impaired sensation, improper body mechanics, prosthetic dependency , and pain  ACTIVITY LIMITATIONS: carrying, lifting, bending, squatting, stairs, transfers, bed mobility, bathing, dressing, reach over head, hygiene/grooming, and locomotion level  PARTICIPATION LIMITATIONS: shopping, community activity, occupation, and yard work  PERSONAL FACTORS: Past/current experiences and 1-2 comorbidities: CMT and L BKA are also affecting patient's functional outcome.   REHAB POTENTIAL: Excellent  CLINICAL DECISION MAKING: Evolving/moderate complexity  EVALUATION COMPLEXITY: Moderate   GOALS: Goals reviewed with patient? Yes  SHORT TERM GOALS: Target date: 02/11/2024   Pt will be independent with initial HEP for improved strength, balance, transfers and gait. Baseline: not  established on eval  Goal status: INITIAL  2.  Pt will demonstrate a gait speed of >/=2.22 feet/sec in order to decrease risk for falls. Baseline: 2.02 ft/sec w/ 2WW Goal status: INITIAL  3.  Pt will perform floor transfer w/CGA w/L prosthetic donned for improved fall recovery and safety at home  Baseline:  Goal status: INITIAL  4.  to be assessed and LTG updated  Baseline:  Goal status: INITIAL   LONG TERM GOALS: Target date: 02/25/2024    Pt will be independent with final HEP for improved strength, balance, transfers and gait.  Baseline:  Goal status: INITIAL  2.  Pt will demonstrate a gait speed of >/=2.42 feet/sec in order to decrease risk for falls. Baseline: 2.02 ft/sec w/ 2WW Goal status: INITIAL  3.  goal  Baseline:  Goal status: INITIAL  4.  Pt will perform floor transfer mod I  w/L prosthetic donned for improved fall recovery and safety at home  Baseline:  Goal status: INITIAL   PLAN:  PT FREQUENCY: 2x/week  PT DURATION: 6 weeks  PLANNED INTERVENTIONS: [id number]- PT Re-evaluation, 97750- Physical Performance Testing, 97110-Therapeutic exercises, 97530- Therapeutic activity, [id number]- Neuromuscular re-education, 97535- Self Care, [id number]- Manual therapy, [id number]- Gait training, [phone]- Orthotic/Prosthetic subsequent, [id number]- Aquatic Therapy, [phone]- Electrical stimulation (manual), [phone] (1-2 muscles), [id number] (3+ muscles)- Dry Needling, Patient/Family education, Balance training, Stair training, Joint mobilization, Spinal mobilization, Vestibular training, and DME instructions  PLAN FOR NEXT SESSION:  Add to high level HEP for weightbearing tolerance on LLE, glute strength and hip extension to assist w/gait. Stairs, walking w/reduced support on AD, walking on unlevel surfaces, staggered sit to stands, RDLs, core stability   Check all possible CPT codes: See Planned Interventions List for Planned CPT Codes    Check all conditions that are expected to impact  treatment: Structural or anatomic abnormalities, Neurological condition and/or seizures, and Psychological or psychiatric disorders   If treatment provided at initial evaluation, no treatment charged due to lack of authorization.     Daved KATHEE Bull, PT, DPT 01/22/2024, 2:07 PM

## 2024-01-22 NOTE — Patient Instructions (Addendum)
 Desensitization Techniques: -Light touch/pressure:  using fingertip pressure to slowly move up small segments of the affected body part until outside the area of pain and then back to where you started -Deep pressure:  using fingertips to squeeze in small segments starting outside the affected area, crossing over the affected area, and then to the other side of the affected area -Tapping:  fingertips tap with firm pressure over the affected area, can move around the affected area as well -Brushing/scratching:  use fingertips to brush/scratch over and around the affected area -Textures:  use soft and rough textured items like cotton balls vs wash cloths to brush or rub with light into firm pressure over and around the affected area  Repeat these 3-4x per day. -Step taps at sink - Supine 90/90 Alternating Heel Touches with Posterior Pelvic Tilt  - 1 x daily - 7 x weekly - 3 sets - 10 reps

## 2024-01-26 ENCOUNTER — Encounter: Admitting: Physical Therapy

## 2024-01-26 ENCOUNTER — Telehealth: Payer: Self-pay | Admitting: Physical Therapy

## 2024-01-26 NOTE — Telephone Encounter (Signed)
 This is to document my attempt to call patient after no-show for PT appt this AM.  This is patient's # 1 missed appt.   Primary phone number(s) was used in efforts to contact the patient.   Voice mail was left requesting the patient call the clinic back at [phone] to confirm upcoming appointment and reminding them of the clinic's attendance policy.   Daved Bull, PT, DPT

## 2024-01-29 ENCOUNTER — Encounter: Admitting: Physical Therapy

## 2024-01-29 ENCOUNTER — Telehealth: Payer: Self-pay | Admitting: Physical Therapy

## 2024-01-29 NOTE — Telephone Encounter (Signed)
 Called and spoke to pt regarding no-show to scheduled PT appointment. Pt reports he is having a bad week and also missed his appointment with [facility] yesterday. Pt states he is having pain in his residual limb and cannot tolerate weightbearing well, denied a wound. Pt encouraged to call [facility] to reschedule and reminded pt of next appointment date and time.  Rye Decoste E Leala Bryand, PT, DPT

## 2024-02-02 ENCOUNTER — Ambulatory Visit: Attending: Orthopedic Surgery | Admitting: Physical Therapy

## 2024-02-02 ENCOUNTER — Encounter: Payer: Self-pay | Admitting: Physical Therapy

## 2024-02-02 DIAGNOSIS — M6281 Muscle weakness (generalized): Secondary | ICD-10-CM | POA: Insufficient documentation

## 2024-02-02 DIAGNOSIS — G547 Phantom limb syndrome without pain: Secondary | ICD-10-CM | POA: Diagnosis present

## 2024-02-02 DIAGNOSIS — R2689 Other abnormalities of gait and mobility: Secondary | ICD-10-CM | POA: Insufficient documentation

## 2024-02-02 DIAGNOSIS — Z9181 History of falling: Secondary | ICD-10-CM | POA: Diagnosis present

## 2024-02-02 NOTE — Therapy (Signed)
 OUTPATIENT PHYSICAL THERAPY PROSTHETICS TREATMENT   Patient Name: [name] MRN: [id number] DOB:Aug 31, 1965, 58 y.o., male Today's Date: 02/02/2024  PCP: Fritzi Nest, MD REFERRING PROVIDER: Harden Jerona GAILS, MD  END OF SESSION:  PT End of Session - 02/02/24 1625     Visit Number 4    Number of Visits 13    Date for PT Re-Evaluation 03/03/24    Authorization Type [employer]    PT Start Time 1616    PT Stop Time 1658    PT Time Calculation (min) 42 min    Equipment Utilized During Treatment Other (comment);Gait belt   L BKA prosthetic   Activity Tolerance Patient tolerated treatment well;Patient limited by pain    Behavior During Therapy WFL for tasks assessed/performed          Past Medical History:  Diagnosis Date   ADHD (attention deficit hyperactivity disorder)    Anxiety and depression    Candida esophagitis (HCC)    in setting of DKA/hyperglycemia   CIDP (chronic inflammatory demyelinating polyneuropathy) (HCC)    Depression    Diabetes type 2, controlled (HCC)    Diverticulosis    Essential hypertension 06/21/2014   Hyperlipidemia    Kidney stones    Pancreatitis    Stomach ulcer    Vitamin D deficiency    Past Surgical History:  Procedure Laterality Date   AMPUTATION Left 10/16/2023   Procedure: LEFT BELOW KNEE AMPUTATION;  Surgeon: Harden Jerona GAILS, MD;  Location: [facility] OR;  Service: Orthopedics;  Laterality: Left;  LEFT BELOW KNEE AMPUTATION   ANKLE SURGERY     APPLICATION OF WOUND VAC Left 10/16/2023   Procedure: APPLICATION, WOUND VAC;  Surgeon: Harden Jerona GAILS, MD;  Location: [facility] OR;  Service: Orthopedics;  Laterality: Left;   CERVICAL DISC SURGERY     C4-C5   ELBOW SURGERY     ESOPHAGOGASTRODUODENOSCOPY N/A 07/16/2017   Procedure: ESOPHAGOGASTRODUODENOSCOPY (EGD);  Surgeon: Avram Lupita BRAVO, MD;  Location: [facility] END[facility]COPY;  Service: Endoscopy;  Laterality: N/A;   IRRIGATION AND DEBRIDEMENT FOOT Left 07/31/2023   Procedure: IRRIGATION AND  DEBRIDEMENT FOOT AND FIFTH MET RESCTION;  Surgeon: Malvin Marsa FALCON, DPM;  Location: [facility] OR;  Service: Orthopedics/Podiatry;  Laterality: Left;   RETINAL DETACHMENT SURGERY     RHINOPLASTY     VASECTOMY     WOUND DEBRIDEMENT Left 03/27/2018   Procedure: DEBRIDEMENT WOUND;  Surgeon: Gershon Donnice SAUNDERS, DPM;  Location: [facility] OR;  Service: Podiatry;  Laterality: Left;   Patient Active Problem List   Diagnosis Date Noted   Bipolar II disorder (HCC) 10/23/2023   Malnutrition of moderate degree 10/22/2023   Osteomyelitis of left foot (HCC) 10/21/2023   S/P BKA (below knee amputation) unilateral, left (HCC) 10/16/2023   Thrombophlebitis arm 08/03/2023   Hypokalemia 07/30/2023   Subacute osteomyelitis, left ankle and foot (HCC) 07/29/2023   Foot ulcer with fat layer exposed, left (HCC) 11/15/2019   Displaced fracture of shaft of left clavicle, initial encounter for closed fracture 03/15/2019   Dupuytren's contracture 07/29/2018   PICC (peripherally inserted central catheter) in place 05/24/2018   Neuropathy 05/12/2018   Osteomyelitis of ankle or foot, acute, left (HCC) 05/05/2018   Diabetic ulcer of left midfoot associated with type 2 diabetes mellitus, limited to breakdown of skin (HCC) 12/18/2017   Chronic pain syndrome 11/03/2017   Gastroesophageal reflux disease with esophagitis without hemorrhage 09/17/2017   Esophageal dysphagia    DKA, type 2 (HCC) 07/15/2017   ADD (attention  deficit disorder) 07/15/2017   DKA (diabetic ketoacidosis) (HCC) 07/15/2017   Vitamin B12 deficiency (non anemic) 08/13/2016   Adhesive capsulitis of left shoulder 01/18/2016   Trigger ring finger of right hand 10/11/2015   Primary osteoarthritis of first carpometacarpal joint of left hand 06/14/2015   Radial styloid tenosynovitis 06/14/2015   Depression 10/05/2014   Noncompliance with diabetes treatment 10/05/2014   Vitamin D deficiency 09/14/2014   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   Diabetic  peripheral neuropathy associated with type 2 diabetes mellitus (HCC) 06/21/2014   Essential hypertension 06/21/2014   Erectile dysfunction associated with type 2 diabetes mellitus (HCC) 06/21/2014   Long-term current use of insulin  for diabetes mellitus (HCC) 06/21/2014   Adhesive capsulitis of right shoulder 03/24/2011   Retinal detachment 12/06/2010   Rotator cuff syndrome 12/06/2010   History of pancreatitis 10/22/2010   Hypertriglyceridemia 10/22/2010   Diabetes mellitus type II, uncontrolled 10/22/2010    ONSET DATE: 01/06/2024 (referral)   REFERRING DIAG: S10.487 (ICD-10-CM) - S/P BKA (below knee amputation) unilateral, left (HCC)  THERAPY DIAG:  Other abnormalities of gait and mobility  Muscle weakness (generalized)  Phantom limb syndrome without pain (HCC)  History of falling  Rationale for Evaluation and Treatment: Rehabilitation  SUBJECTIVE:   SUBJECTIVE STATEMENT: Pt presents w/RW and BKA socket, wearing 2 ply socks currently. He continues to have phantom pains in the ball of the left foot.  He reports increased depression and not wearing his prosthetic as much last week due to pain.  He missed his [facility] appt and rescheduled this for 9/10 with [facility].  Pt accompanied by: self  PERTINENT HISTORY: T2DM, CIDP, 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 Pierce), IV antibiotics and hyperbaric tx ([facility]), status post below-knee amputation on the left from May 16  PAIN:  Are you having pain? Yes: NPRS scale: 4-5 Pain location: generally Pain description: phantom pain Aggravating factors: constant Relieving factors: unsure  PRECAUTIONS: Fall  RED FLAGS: None   WEIGHT BEARING RESTRICTIONS: No  FALLS: Has patient fallen in last 6 months? Yes. Number of falls a couple  LIVING ENVIRONMENT: Lives with: lives alone Lives in:  House/apartment Home Access: Ramped entrance Home layout: One level Stairs: No Has following equipment at home: Environmental consultant - 2 wheeled, Wheelchair (manual), and Tour manager  PLOF: Independent and Leisure: Tennis and biking  PATIENT GOALS:  I wanna be able to run. I want to go above and beyond what I was doing before.  I would like to walk into the football game with either nothing or a cane (First weekend in September)   OBJECTIVE:  Note: Objective measures were completed at Evaluation unless otherwise noted.  DIAGNOSTIC FINDINGS: None relevant on file   COGNITION: Overall cognitive status: Within functional limits for tasks assessed   SENSATION: Pt reports frequent phantom pain in LLE, most recently sensation is that his L ankle is sore, as if he has rolled his ankle   POSTURE: rounded shoulders  LOWER EXTREMITY ROM:  Active ROM Right eval Left eval  Hip flexion    Hip extension    Hip abduction    Hip adduction    Hip internal rotation    Hip external rotation    Knee flexion    Knee extension    Ankle dorsiflexion    Ankle plantarflexion    Ankle inversion    Ankle eversion     (  Blank rows = not tested)  LOWER EXTREMITY MMT:  MMT Right eval Left eval  Hip flexion    Hip extension    Hip abduction    Hip adduction    Hip internal rotation    Hip external rotation    Knee flexion    Knee extension    Ankle dorsiflexion    Ankle plantarflexion    Ankle inversion    Ankle eversion    (Blank rows = not tested)  BED MOBILITY:  Pt reports independence with this   TRANSFERS: Sit to stand: Modified independence Stand to sit: Modified independence Pt demonstrates significant weight shift to R side w/posterior instability, but is able to perform without AD   RAMP: Not assessed on eval   CURB: Not assessed on eval   GAIT:  Gait pattern: step through pattern and lateral hip instability Distance walked: Various short clinic distances Assistive device  utilized: Environmental consultant - 2 wheeled Level of assistance: Modified independence Comments: Pt entered and exited clinic w/RW, no instability noted     FUNCTIONAL TESTS:      CURRENT PROSTHETIC WEAR ASSESSMENT: Patient is independent with: skin check, residual limb care, care of non-amputated limb, prosthetic cleaning, ply sock cleaning, and correct ply sock adjustment Patient is dependent with: proper wear schedule/adjustment and proper weight-bearing schedule/adjustment Donning prosthesis: Complete Independence Doffing prosthesis: Complete Independence Prosthetic wear tolerance: 8-10 hours/day, 7 days/week Prosthetic weight bearing tolerance: >45 minutes Residual limb condition: Intact residual limb w/well-healed incision, no adhesions noted w/good compliance to wearing shrinker. Pt w/long residual limb Prosthetic description: PTB BKA prosthetic w/Pin lock  K code/activity level with prosthetic use: Level 3                                                                                                                TREATMENT:  Ther Act/Prosthetic Management Assessed L residual limb and did not note any bruising, but pt is TTP along lateral distal limb where he has a bony prominence - no redness this date.  Blanchable redness over patella with education on sock ply management as he is likely sinking too deep in socket - he is limited by short pin so discussed mentioning this to [facility], cautioned him about wound development (20 minutes or longer of redness) in areas that are not meant to be load bearing.  He has a mild thickening of distal limb skin, no formal callus.  Pinpoint red/purple area at medial end of incision - pt states this has been there but PT requested he monitor at home.  Edu on bringing sock options to sessions and when out and about for adjustment.  NMR: Bilateral leg press x12 at 70lbs > 90 lbs > 110 lbs (2x12) Wall bumps posteriorly x15 > laterally x15 each > forward  x15  PATIENT EDUCATION: Education details: Continue initial HEP w/ additions today.  Encouraged amputee support groups - provided handouts of information for in-person and virtual today.  Offered to reach out to PCP regarding recent depressive episode - pt  declines as he has a Veterinary surgeon and reports he will reach out to them. Person educated: Patient Education method: Explanation, Demonstration, Tactile cues, Verbal cues, and Handouts Education comprehension: verbalized understanding, returned demonstration, verbal cues required, tactile cues required, and needs further education  HOME EXERCISE PROGRAM: Access Code: [email or web address] URL: [email or web address] Date: 01/19/2024 Prepared by: Marlon Plaster  Exercises - Diagonal Hip Extension with Resistance  - 1 x daily - 7 x weekly - 3 sets - 10 reps - Bird Dog  - 1 x daily - 7 x weekly - 3 sets - 10 reps - Side Stepping with Resistance at Thighs and Counter Support  - 1 x daily - 7 x weekly - 3 sets - 10 reps -Step taps at sink - Supine 90/90 Alternating Heel Touches with Posterior Pelvic Tilt  - 1 x daily - 7 x weekly - 3 sets - 10 reps -Wall bumps all directions x15 each direction once a day  ASSESSMENT:  CLINICAL IMPRESSION: Emphasis of skilled session today on further prosthetic education regarding sock ply adjustment and concerning areas of weight bearing requiring likely prosthetic fit adjustments.  His appt is not until Wednesday of next week and poor fit is likely to impact performance of coming visits leading up to that date.  He would benefit from further work on static and dynamic stability as tolerated, gait training, and general hip and knee stability. Continue POC.   OBJECTIVE IMPAIRMENTS: Abnormal gait, decreased activity tolerance, decreased balance, decreased endurance, decreased mobility, difficulty walking, decreased strength, impaired sensation, improper body mechanics, prosthetic dependency , and pain  ACTIVITY  LIMITATIONS: carrying, lifting, bending, squatting, stairs, transfers, bed mobility, bathing, dressing, reach over head, hygiene/grooming, and locomotion level  PARTICIPATION LIMITATIONS: shopping, community activity, occupation, and yard work  PERSONAL FACTORS: Past/current experiences and 1-2 comorbidities: CMT and L BKA are also affecting patient's functional outcome.   REHAB POTENTIAL: Excellent  CLINICAL DECISION MAKING: Evolving/moderate complexity  EVALUATION COMPLEXITY: Moderate   GOALS: Goals reviewed with patient? Yes  SHORT TERM GOALS: Target date: 02/11/2024   Pt will be independent with initial HEP for improved strength, balance, transfers and gait. Baseline: not established on eval  Goal status: INITIAL  2.  Pt will demonstrate a gait speed of >/=2.22 feet/sec in order to decrease risk for falls. Baseline: 2.02 ft/sec w/ 2WW Goal status: INITIAL  3.  Pt will perform floor transfer w/CGA w/L prosthetic donned for improved fall recovery and safety at home  Baseline:  Goal status: INITIAL  4.  to be assessed and LTG updated  Baseline:  Goal status: INITIAL   LONG TERM GOALS: Target date: 02/25/2024    Pt will be independent with final HEP for improved strength, balance, transfers and gait.  Baseline:  Goal status: INITIAL  2.  Pt will demonstrate a gait speed of >/=2.42 feet/sec in order to decrease risk for falls. Baseline: 2.02 ft/sec w/ 2WW Goal status: INITIAL  3.  goal  Baseline:  Goal status: INITIAL  4.  Pt will perform floor transfer mod I  w/L prosthetic donned for improved fall recovery and safety at home  Baseline:  Goal status: INITIAL   PLAN:  PT FREQUENCY: 2x/week  PT DURATION: 6 weeks  PLANNED INTERVENTIONS: [id number]- PT Re-evaluation, 97750- Physical Performance Testing, 97110-Therapeutic exercises, 97530- Therapeutic activity, [id number]- Neuromuscular re-education, 97535- Self Care, [id number]- Manual therapy, [id number]- Gait  training, [id number]- Orthotic/Prosthetic subsequent, [id number]- Aquatic Therapy, [id number]- Electrical stimulation (manual), [phone] (1-2 muscles), [id number] (3+ muscles)- Dry Needling, Patient/Family  education, Location manager, Stair training, Joint mobilization, Spinal mobilization, Vestibular training, and DME instructions  PLAN FOR NEXT SESSION:  Add to high level HEP for weightbearing tolerance on LLE, glute strength and hip extension to assist w/gait. Stairs, walking w/reduced support on AD, walking on unlevel surfaces, staggered sit to stands, RDLs, core stability - may need to limit to mat level/seated or static tasks due to prosthetic fit?  Check skin!  Check all possible CPT codes: See Planned Interventions List for Planned CPT Codes    Check all conditions that are expected to impact treatment: Structural or anatomic abnormalities, Neurological condition and/or seizures, and Psychological or psychiatric disorders   If treatment provided at initial evaluation, no treatment charged due to lack of authorization.     Daved KATHEE Bull, PT, DPT 02/02/2024, 5:11 PM

## 2024-02-04 ENCOUNTER — Ambulatory Visit: Admitting: Physical Therapy

## 2024-02-09 ENCOUNTER — Ambulatory Visit: Admitting: Physical Therapy

## 2024-02-11 ENCOUNTER — Ambulatory Visit: Admitting: Physical Therapy

## 2024-02-11 ENCOUNTER — Encounter (HOSPITAL_COMMUNITY): Payer: Self-pay

## 2024-02-11 ENCOUNTER — Ambulatory Visit (HOSPITAL_COMMUNITY): Admission: EM | Admit: 2024-02-11 | Discharge: 2024-02-11 | Disposition: A | Discharge status: Home / Self Care

## 2024-02-11 DIAGNOSIS — R2689 Other abnormalities of gait and mobility: Secondary | ICD-10-CM

## 2024-02-11 DIAGNOSIS — M6281 Muscle weakness (generalized): Secondary | ICD-10-CM

## 2024-02-11 DIAGNOSIS — G547 Phantom limb syndrome without pain: Secondary | ICD-10-CM

## 2024-02-11 DIAGNOSIS — L089 Local infection of the skin and subcutaneous tissue, unspecified: Secondary | ICD-10-CM | POA: Diagnosis not present

## 2024-02-11 NOTE — ED Provider Notes (Signed)
 PCP: Fritzi Nest, MD Chief Complaint: Foreign Body   Subjective:   HPI: Patient is a 58 y.o. male here for foreign body in his left buttock.  Patient is a high fall risk and is very unsteady on his feet, he states that last night he fell down and cracked the toilet on the left side of his butt cheek and felt like there was a 2 inch piece of material in his butt cheek.  Patient has pain when sitting.  And he feels like he feels something hard back there.  He is not able to visualize area.  When asked if there were pieces missing from his toilet he stated that he thought there was.  Patient states that he has dealt with chronic falls.  Past Medical History:  Diagnosis Date   ADHD (attention deficit hyperactivity disorder)    Anxiety and depression    Candida esophagitis (HCC)    in setting of DKA/hyperglycemia   CIDP (chronic inflammatory demyelinating polyneuropathy) (HCC)    Depression    Diabetes type 2, controlled (HCC)    Diverticulosis    Essential hypertension 06/21/2014   Hyperlipidemia    Kidney stones    Pancreatitis    Stomach ulcer    Vitamin D deficiency     No current facility-administered medications on file prior to encounter.   Current Outpatient Medications on File Prior to Encounter  Medication Sig Dispense Refill   acetaminophen  (TYLENOL ) 500 MG tablet Take 2 tablets (1,000 mg total) by mouth 3 (three) times daily. 30 tablet 0   amphetamine -dextroamphetamine  (ADDERALL) 30 MG tablet Take 1 tablet by mouth 2 (two) times daily.     ascorbic acid  (VITAMIN C ) 1000 MG tablet Take 1 tablet (1,000 mg total) by mouth daily.     atorvastatin  (LIPITOR) 10 MG tablet Take 10 mg by mouth daily.     Blood Glucose Monitoring Suppl DEVI 1 each by Does not apply route 3 (three) times daily. May dispense any manufacturer covered by patient's insurance. 1 each 0   Cholecalciferol (VITAMIN D3 MAXIMUM STRENGTH) 125 MCG (5000 UT) capsule Take 5,000 Units by mouth daily.      clonazePAM  (KLONOPIN ) 1 MG tablet Take 1-1.5 mg by mouth at bedtime.     Continuous Blood Gluc Receiver (FREESTYLE LIBRE 14 DAY READER) DEVI every 14 (fourteen) days.     Continuous Blood Gluc Sensor (FREESTYLE LIBRE 14 DAY SENSOR) MISC   2   cyanocobalamin (VITAMIN B12) 1000 MCG tablet Take 2,000 mcg by mouth daily.     Desvenlafaxine ER 100 MG TB24 Take 100 mg by mouth daily.     diclofenac  Sodium (VOLTAREN ) 1 % GEL Apply 2 g topically 4 (four) times daily. To left elbow 350 g 0   docusate sodium  (COLACE) 100 MG capsule Take 1 capsule (100 mg total) by mouth 2 (two) times daily. 10 capsule 0   fenofibrate  160 MG tablet Take 1 tablet (160 mg total) by mouth daily. 30 tablet 0   fluticasone  (FLONASE ) 50 MCG/ACT nasal spray Place 1 spray into both nostrils daily. 16 g 0   gabapentin  (NEURONTIN ) 800 MG tablet Take 800 mg by mouth 3 (three) times daily.     Glucagon  (GVOKE HYPOPEN  2-PACK) 1 MG/0.2ML SOAJ Inject 1 mg into the skin as needed for up to 2 doses (Severe low blood sugar). 0.4 mL 0   Glucose Blood (BLOOD GLUCOSE TEST STRIPS) STRP 1 each by Does not apply route 3 (three) times daily. Use as directed  to check blood sugar. May dispense any manufacturer covered by patient's insurance and fits patient's device. 100 strip 0   insulin  aspart (NOVOLOG ) 100 UNIT/ML FlexPen Inject 0-6 Units into the skin 3 (three) times daily with meals. Check Blood Glucose (BG) and inject per scale: BG <150= 0 unit; BG 150-200= 1 unit; BG 201-250= 2 unit; BG 251-300= 3 unit; BG 301-350= 4 unit; BG 351-400= 5 unit; BG >400= 6 unit and Call Primary Care. (Patient taking differently: Inject 8-24 Units into the skin 3 (three) times daily as needed for high blood sugar.) 15 mL 0   insulin  glargine (LANTUS ) 100 UNIT/ML Solostar Pen Inject 45 Units into the skin at bedtime. May substitute as needed per insurance.     Insulin  Pen Needle (PEN NEEDLES) 31G X 5 MM MISC 1 each by Does not apply route 3 (three) times daily. May  dispense any manufacturer covered by patient's insurance. 100 each 0   Lancet Device MISC 1 each by Does not apply route 3 (three) times daily. May dispense any manufacturer covered by patient's insurance. 1 each 0   Lancets MISC 1 each by Does not apply route 3 (three) times daily. Use as directed to check blood sugar. May dispense any manufacturer covered by patient's insurance and fits patient's device. 100 each 0   lithium  carbonate 150 MG capsule Take 150 mg by mouth at bedtime.     loratadine  (CLARITIN ) 10 MG tablet Take 1 tablet (10 mg total) by mouth daily. 30 tablet 0   lumateperone  tosylate (CAPLYTA ) 42 MG capsule Take 42 mg by mouth at bedtime.     methocarbamol  (ROBAXIN ) 750 MG tablet Take 1 tablet (750 mg total) by mouth every 6 (six) hours as needed for muscle spasms. 45 tablet 0   Multiple Vitamin (MULTIVITAMIN WITH MINERALS) TABS tablet Take 1 tablet by mouth daily.     Nutritional Supplements (JUVEN PO) Take 1 Container by mouth 2 (two) times daily.     oxyCODONE  (OXY IR/ROXICODONE ) 5 MG immediate release tablet Take 1 tablet (5 mg total) by mouth every 6 (six) hours as needed for severe pain (pain score 7-10). 20 tablet 0   pantoprazole  (PROTONIX ) 40 MG tablet Take 1 tablet (40 mg total) by mouth daily. 30 tablet 0   protein supplement (RESOURCE BENEPROTEIN) POWD Take 6 g by mouth 3 (three) times daily with meals.     TECHLITE PEN NEEDLES 31G X 8 MM MISC USE TO GIVE INSULIN  4 TIMES DAILY     traZODone  (DESYREL ) 50 MG tablet Take 50-100 mg by mouth at bedtime as needed for sleep.     Zinc  Sulfate 220 (50 Zn) MG TABS Take 1 tablet (220 mg total) by mouth daily. 30 tablet 0    BP 137/83 (BP Location: Left Arm)   Pulse 72   Temp 98.1 F (36.7 C) (Oral)   Resp 18   SpO2 95%        Objective:   Gen: Well developed, well nourished male in no acute distress. HEENT: Pupils equal, round, and reactive to light.  Conjunctiva non-injected.  Nares patent without discharge.  Oral  mucosa is moist and pink.   Lungs: Normal respiratory effort Skin: Patient's left Clute region was palpated and patient pointed to an area where a superficial pustule was present, when I palpated this area patient had no pain to palpation, no foreign body was felt, there was some erythema in the region, but no foreign body palpated Ext: No cyanosis, clubbing,  or edema.  Assessment/Plan:   [name] is a 58 y.o. male who was seen today for the following: 1. Skin pustule (Primary) - I discussed with patient that if he felt like he had a foreign body due from a fall, that we could easily obtain x-ray imaging here in the urgent care - He felt embarrassed that he came in for a pustule and would not consent to getting an x-ray - I reiterated to him that if he feels like he has a foreign body somewhere, that we can rule this out but he would not agree to x-ray - I offered to excise a very small pustule for him to which he denied - Patient is a high fall risk and should follow-up with PCP and have a workup of why he is falling frequently - Gave instructions for superficial skin infections of patient and advised him to follow-up should there be any further issues or problems  Follow-up/Education:   May return sooner as needed and encouraged to call/e-mail for additional questions or  worsening symptoms in the interim.  Krystal Lowing, DO Sports Medicine Fellow 02/11/2024 12:01 PM  Disclaimer: This transcription was electronically signed. It was transcribed by [place] and may contain errors in the text that were not recognized on proofreading.      Lowing Krystal HERO, DO 02/11/24 1201

## 2024-02-11 NOTE — Therapy (Signed)
 OUTPATIENT PHYSICAL THERAPY PROSTHETICS TREATMENT - ARRIVED NO CHARGE   Patient Name: [name] MRN: [id number] DOB:[birth year], 58 y.o., male Today's Date: 02/11/2024  PCP: Fritzi Nest, MD REFERRING PROVIDER: Harden Jerona GAILS, MD  END OF SESSION:  PT End of Session - 02/11/24 1021     Visit Number 4   Arrived no charge   Number of Visits 13    Date for PT Re-Evaluation 03/03/24    Authorization Type [employer]    PT Start Time 1018   Pt arrived 4 hours early for session   PT Stop Time 1039    PT Time Calculation (min) 21 min    Equipment Utilized During Treatment Other (comment)   L BKA prosthetic   Activity Tolerance Patient limited by pain;Treatment limited secondary to medical complications (Comment);Patient limited by lethargy    Behavior During Therapy [facility] for tasks assessed/performed;Flat affect          Past Medical History:  Diagnosis Date   ADHD (attention deficit hyperactivity disorder)    Anxiety and depression    Candida esophagitis (HCC)    in setting of DKA/hyperglycemia   CIDP (chronic inflammatory demyelinating polyneuropathy) (HCC)    Depression    Diabetes type 2, controlled (HCC)    Diverticulosis    Essential hypertension 06/21/2014   Hyperlipidemia    Kidney stones    Pancreatitis    Stomach ulcer    Vitamin D deficiency    Past Surgical History:  Procedure Laterality Date   AMPUTATION Left 10/16/2023   Procedure: LEFT BELOW KNEE AMPUTATION;  Surgeon: Harden Jerona GAILS, MD;  Location: [facility] OR;  Service: Orthopedics;  Laterality: Left;  LEFT BELOW KNEE AMPUTATION   ANKLE SURGERY     APPLICATION OF WOUND VAC Left 10/16/2023   Procedure: APPLICATION, WOUND VAC;  Surgeon: Harden Jerona GAILS, MD;  Location: [facility] OR;  Service: Orthopedics;  Laterality: Left;   CERVICAL DISC SURGERY     C4-C5   ELBOW SURGERY     ESOPHAGOGASTRODUODENOSCOPY N/A 07/16/2017   Procedure: ESOPHAGOGASTRODUODENOSCOPY (EGD);  Surgeon: Avram Lupita BRAVO, MD;   Location[facility] ENDOSCOPY;  Service: Endoscopy;  Laterality: N/A;   IRRIGATION AND DEBRIDEMENT FOOT Left 07/31/2023   Procedure: IRRIGATION AND DEBRIDEMENT FOOT AND FIFTH MET RESCTION;  Surgeon: Malvin Marsa FALCON, DPM;  Location: [facility] OR;  Service: Orthopedics/Podiatry;  Laterality: Left;   RETINAL DETACHMENT SURGERY     RHINOPLASTY     VASECTOMY     WOUND DEBRIDEMENT Left 03/27/2018   Procedure: DEBRIDEMENT WOUND;  Surgeon: Gershon Donnice SAUNDERS, DPM;  Location: [facility] OR;  Service: Podiatry;  Laterality: Left;   Patient Active Problem List   Diagnosis Date Noted   Bipolar II disorder (HCC) 10/23/2023   Malnutrition of moderate degree 10/22/2023   Osteomyelitis of left foot (HCC) 10/21/2023   S/P BKA (below knee amputation) unilateral, left (HCC) 10/16/2023   Thrombophlebitis arm 08/03/2023   Hypokalemia 07/30/2023   Subacute osteomyelitis, left ankle and foot (HCC) 07/29/2023   Foot ulcer with fat layer exposed, left (HCC) 11/15/2019   Displaced fracture of shaft of left clavicle, initial encounter for closed fracture 03/15/2019   Dupuytren's contracture 07/29/2018   PICC (peripherally inserted central catheter) in place 05/24/2018   Neuropathy 05/12/2018   Osteomyelitis of ankle or foot, acute, left (HCC) 05/05/2018   Diabetic ulcer of left midfoot associated with type 2 diabetes mellitus, limited to breakdown of skin (HCC) 12/18/2017   Chronic pain syndrome 11/03/2017   Gastroesophageal reflux  disease with esophagitis without hemorrhage 09/17/2017   Esophageal dysphagia    DKA, type 2 (HCC) 07/15/2017   ADD (attention deficit disorder) 07/15/2017   DKA (diabetic ketoacidosis) (HCC) 07/15/2017   Vitamin B12 deficiency (non anemic) 08/13/2016   Adhesive capsulitis of left shoulder 01/18/2016   Trigger ring finger of right hand 10/11/2015   Primary osteoarthritis of first carpometacarpal joint of left hand 06/14/2015   Radial styloid tenosynovitis 06/14/2015   Depression 10/05/2014    Noncompliance with diabetes treatment 10/05/2014   Vitamin D deficiency 09/14/2014   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   Diabetic peripheral neuropathy associated with type 2 diabetes mellitus (HCC) 06/21/2014   Essential hypertension 06/21/2014   Erectile dysfunction associated with type 2 diabetes mellitus (HCC) 06/21/2014   Long-term current use of insulin  for diabetes mellitus (HCC) 06/21/2014   Adhesive capsulitis of right shoulder 03/24/2011   Retinal detachment 12/06/2010   Rotator cuff syndrome 12/06/2010   History of pancreatitis 10/22/2010   Hypertriglyceridemia 10/22/2010   Diabetes mellitus type II, uncontrolled 10/22/2010    ONSET DATE: 01/06/2024 (referral)   REFERRING DIAG: S10.487 (ICD-10-CM) - S/P BKA (below knee amputation) unilateral, left (HCC)  THERAPY DIAG:  Other abnormalities of gait and mobility  Muscle weakness (generalized)  Phantom limb syndrome without pain (HCC)  Rationale for Evaluation and Treatment: Rehabilitation  SUBJECTIVE:   SUBJECTIVE STATEMENT: Pt presents w/RW and BKA socket, wearing 6 ply on his LLE. States he saw Medford at [employer], forgot to tell Medford about pain in his distal limb. Medford told him to wear more socks, but pt reports this hurts and has not been compliant.   Pt presents w/scabs all over shin of RLE, due to falls. Pt reports he has had 3 falls since last session, once when he was walking into clinic earlier this week because he forgot to turn properly. Other two falls have been due to transferring improperly to his WC from his bed.   States he had his medication changed about 2 weeks ago, has not felt the same since. Has not reached out to his counselor or psychiatrist regarding this.    Landed too hard on his toilet seat yesterday and broke it, now has a piece of wood embedded in his L buttocks. States he was sao tome and [place] ask his sister to take it out tonight. States the wood is about 1.5 long. But I can still  sit.   Pt accompanied by: self  PERTINENT HISTORY: T2DM, CIDP, 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 Pierce), IV antibiotics and hyperbaric tx ([facility]), status post below-knee amputation on the left from May 16  PAIN:  Are you having pain? Yes: NPRS scale: 4/10 Pain location: LLE  Pain description: phantom pain Aggravating factors: constant Relieving factors: unsure  PRECAUTIONS: Fall  RED FLAGS: None   WEIGHT BEARING RESTRICTIONS: No  FALLS: Has patient fallen in last 6 months? Yes. Number of falls a couple  LIVING ENVIRONMENT: Lives with: lives alone Lives in: House/apartment Home Access: Ramped entrance Home layout: One level Stairs: No Has following equipment at home: Environmental consultant - 2 wheeled, Wheelchair (manual), and Tour manager  PLOF: Independent and Leisure: Tennis and biking  PATIENT GOALS:  I wanna be able to run. I want to go above and beyond what I was doing before.  I would like to walk into the football game with either nothing or a  cane (First weekend in September)   OBJECTIVE:  Note: Objective measures were completed at Evaluation unless otherwise noted.  DIAGNOSTIC FINDINGS: None relevant on file   COGNITION: Overall cognitive status: Within functional limits for tasks assessed   SENSATION: Pt reports frequent phantom pain in LLE, most recently sensation is that his L ankle is sore, as if he has rolled his ankle   POSTURE: rounded shoulders  LOWER EXTREMITY ROM:  Active ROM Right eval Left eval  Hip flexion    Hip extension    Hip abduction    Hip adduction    Hip internal rotation    Hip external rotation    Knee flexion    Knee extension    Ankle dorsiflexion    Ankle plantarflexion    Ankle inversion    Ankle eversion     (Blank rows = not tested)  LOWER EXTREMITY MMT:  MMT Right eval Left eval  Hip  flexion    Hip extension    Hip abduction    Hip adduction    Hip internal rotation    Hip external rotation    Knee flexion    Knee extension    Ankle dorsiflexion    Ankle plantarflexion    Ankle inversion    Ankle eversion    (Blank rows = not tested)  BED MOBILITY:  Pt reports independence with this   TRANSFERS: Sit to stand: Modified independence Stand to sit: Modified independence Pt demonstrates significant weight shift to R side w/posterior instability, but is able to perform without AD   RAMP: Not assessed on eval   CURB: Not assessed on eval   GAIT:  Gait pattern: step through pattern and lateral hip instability Distance walked: Various short clinic distances Assistive device utilized: Environmental consultant - 2 wheeled Level of assistance: Modified independence Comments: Pt entered and exited clinic w/RW, no instability noted     FUNCTIONAL TESTS:      CURRENT PROSTHETIC WEAR ASSESSMENT: Patient is independent with: skin check, residual limb care, care of non-amputated limb, prosthetic cleaning, ply sock cleaning, and correct ply sock adjustment Patient is dependent with: proper wear schedule/adjustment and proper weight-bearing schedule/adjustment Donning prosthesis: Complete Independence Doffing prosthesis: Complete Independence Prosthetic wear tolerance: 8-10 hours/day, 7 days/week Prosthetic weight bearing tolerance: >45 minutes Residual limb condition: Intact residual limb w/well-healed incision, no adhesions noted w/good compliance to wearing shrinker. Pt w/long residual limb Prosthetic description: PTB BKA prosthetic w/Pin lock  K code/activity level with prosthetic use: Level 3                                                                                                                TREATMENT:  Arrived no charge  Pt frequently tripping over RLE when ambulating into session and had a near miss when reaching for chair due to walking outside of RW and  reaching too far out of BOS, requiring mod A to safely sit. Provided max cues to inform pt to stay inside RW and bring RW with him to  turn and sit, which were ignored.  Informed pt he needs to go to urgent care to have wood removed from his L buttocks. Pt reports he was waiting to have his sister do this tonight. She is a Librarian, academic. Informed pt this is an infection risk and he needs to go today, pt states he will go after session.  Informed pt he needs to contact his psychiatrist/counselor and inform them of worsening depression and feeling off since his meds were changed. Pt denies suicidal thoughts.  Discussed need to obtain elevated toilet seat, but will discuss this w/pt at later appointment  Assessed L residual limb and noted significant redness on patella and tibial tuberosity that lightly blanched. Pt reports he does not like the idea of wearing 7 socks and he will just deal with pain. Informed pt this is NOT safe and he is placing himself at risk for serious injury (falls, wounds). Pt reports he will work on this. When pt donned his prosthetic, pt able to obtain 8-9 clicks immediately and informed pt that this is too loose. Pt states he did not bring extra socks today because I am stupid.     PATIENT EDUCATION: Education details: See above  Person educated: Patient Education method: Explanation, Demonstration, Tactile cues, and Verbal cues Education comprehension: returned demonstration, verbal cues required, tactile cues required, and needs further education  HOME EXERCISE PROGRAM: Access Code: [email or web address] URL: [email or web address] Date: 01/19/2024 Prepared by: Marlon Edilson Vital  Exercises - Diagonal Hip Extension with Resistance  - 1 x daily - 7 x weekly - 3 sets - 10 reps - Bird Dog  - 1 x daily - 7 x weekly - 3 sets - 10 reps - Side Stepping with Resistance at Thighs and Counter Support  - 1 x daily - 7 x weekly - 3 sets - 10 reps -Step taps at sink - Supine 90/90  Alternating Heel Touches with Posterior Pelvic Tilt  - 1 x daily - 7 x weekly - 3 sets - 10 reps -Wall bumps all directions x15 each direction once a day  ASSESSMENT:  CLINICAL IMPRESSION: Session limited due to pain, impaired cognition and recent injury to buttocks. Pt arrived to clinic at 10am but did not have PT scheduled until 2pm. Pt reports he did not think to call, just showed up. Pt later stating he has another appointment at 2pm today, but cannot remember what this is. Pt states he fell and broke his wooden toilet seat yesterday and has a piece of wood embedded in his L glute. Strongly recommended pt go to urgent care today to have this addressed. Pt states he had his psych meds changed 2 weeks ago and has been off since, have not followed up with his psychiatrist or counselor about this. Pt denies suicidal thoughts but is severely depressed and falling frequently w/noncompliance to medical advice. Pt requesting to leave session due to GI discomfort and stated he would go to urgent care. Continue POC.   OBJECTIVE IMPAIRMENTS: Abnormal gait, decreased activity tolerance, decreased balance, decreased endurance, decreased mobility, difficulty walking, decreased strength, impaired sensation, improper body mechanics, prosthetic dependency , and pain  ACTIVITY LIMITATIONS: carrying, lifting, bending, squatting, stairs, transfers, bed mobility, bathing, dressing, reach over head, hygiene/grooming, and locomotion level  PARTICIPATION LIMITATIONS: shopping, community activity, occupation, and yard work  PERSONAL FACTORS: Past/current experiences and 1-2 comorbidities: CMT and L BKA are also affecting patient's functional outcome.   REHAB POTENTIAL: Excellent  CLINICAL DECISION MAKING: Evolving/moderate complexity  EVALUATION COMPLEXITY: Moderate  GOALS: Goals reviewed with patient? Yes  SHORT TERM GOALS: Target date: 02/11/2024   Pt will be independent with initial HEP for improved  strength, balance, transfers and gait. Baseline: not established on eval  Goal status: INITIAL  2.  Pt will demonstrate a gait speed of >/=2.22 feet/sec in order to decrease risk for falls. Baseline: 2.02 ft/sec w/ 2WW Goal status: INITIAL  3.  Pt will perform floor transfer w/CGA w/L prosthetic donned for improved fall recovery and safety at home  Baseline:  Goal status: INITIAL  4.  to be assessed and LTG updated  Baseline:  Goal status: INITIAL   LONG TERM GOALS: Target date: 02/25/2024    Pt will be independent with final HEP for improved strength, balance, transfers and gait.  Baseline:  Goal status: INITIAL  2.  Pt will demonstrate a gait speed of >/=2.42 feet/sec in order to decrease risk for falls. Baseline: 2.02 ft/sec w/ 2WW Goal status: INITIAL  3.  goal  Baseline:  Goal status: INITIAL  4.  Pt will perform floor transfer mod I  w/L prosthetic donned for improved fall recovery and safety at home  Baseline:  Goal status: INITIAL   PLAN:  PT FREQUENCY: 2x/week  PT DURATION: 6 weeks  PLANNED INTERVENTIONS: 97164- PT Re-evaluation, 97750- Physical Performance Testing, 97110-Therapeutic exercises, 97530- Therapeutic activity, [id number]- Neuromuscular re-education, 97535- Self Care, [id number]- Manual therapy, [id number]- Gait training, [phone]- Orthotic/Prosthetic subsequent, [id number]- Aquatic Therapy, [phone]- Electrical stimulation (manual), [phone] (1-2 muscles), [id number] (3+ muscles)- Dry Needling, Patient/Family education, Balance training, Stair training, Joint mobilization, Spinal mobilization, Vestibular training, and DME instructions  PLAN FOR NEXT SESSION:  Add to high level HEP for weightbearing tolerance on LLE, glute strength and hip extension to assist w/gait. Stairs, walking w/reduced support on AD, walking on unlevel surfaces, staggered sit to stands, RDLs, core stability - may need to limit to mat level/seated or static tasks due to prosthetic fit?  Check  skin!  Check all possible CPT codes: See Planned Interventions List for Planned CPT Codes    Check all conditions that are expected to impact treatment: Structural or anatomic abnormalities, Neurological condition and/or seizures, and Psychological or psychiatric disorders   If treatment provided at initial evaluation, no treatment charged due to lack of authorization.     Lexi Conaty E Rennae Ferraiolo, PT, DPT 02/11/2024, 10:42 AM

## 2024-02-11 NOTE — ED Triage Notes (Signed)
 Pt present fall last night and broke toilet seat, pt state a piece of toilet seat broke off inside his buttocks

## 2024-02-11 NOTE — Discharge Instructions (Signed)
 There was no palpated foreign body in the region that you mentioned There is a small zit present in this area

## 2024-02-16 ENCOUNTER — Encounter: Payer: Self-pay | Admitting: Physical Therapy

## 2024-02-16 ENCOUNTER — Ambulatory Visit: Admitting: Physical Therapy

## 2024-02-16 VITALS — BP 148/82 | HR 96

## 2024-02-16 DIAGNOSIS — G547 Phantom limb syndrome without pain: Secondary | ICD-10-CM

## 2024-02-16 DIAGNOSIS — R2689 Other abnormalities of gait and mobility: Secondary | ICD-10-CM

## 2024-02-16 DIAGNOSIS — M6281 Muscle weakness (generalized): Secondary | ICD-10-CM

## 2024-02-16 DIAGNOSIS — Z9181 History of falling: Secondary | ICD-10-CM

## 2024-02-16 NOTE — Therapy (Signed)
 OUTPATIENT PHYSICAL THERAPY PROSTHETICS TREATMENT   Patient Name: [name] MRN: [id number] DOB:12/23/1965, 58 y.o., male Today's Date: 02/16/2024  PCP: Fritzi Nest, MD REFERRING PROVIDER: Harden Jerona GAILS, MD  END OF SESSION:  PT End of Session - 02/16/24 1501     Visit Number 5    Number of Visits 13    Date for PT Re-Evaluation 03/03/24    Authorization Type [employer]    PT Start Time 1449    PT Stop Time 1512    PT Time Calculation (min) 23 min    Equipment Utilized During Treatment Other (comment);Gait belt   L BKA prosthetic   Activity Tolerance Patient limited by pain;Patient limited by lethargy    Behavior During Therapy [facility] for tasks assessed/performed;Flat affect          Past Medical History:  Diagnosis Date   ADHD (attention deficit hyperactivity disorder)    Anxiety and depression    Candida esophagitis (HCC)    in setting of DKA/hyperglycemia   CIDP (chronic inflammatory demyelinating polyneuropathy) (HCC)    Depression    Diabetes type 2, controlled (HCC)    Diverticulosis    Essential hypertension 06/21/2014   Hyperlipidemia    Kidney stones    Pancreatitis    Stomach ulcer    Vitamin D deficiency    Past Surgical History:  Procedure Laterality Date   AMPUTATION Left 10/16/2023   Procedure: LEFT BELOW KNEE AMPUTATION;  Surgeon: Harden Jerona GAILS, MD;  Location: [facility] OR;  Service: Orthopedics;  Laterality: Left;  LEFT BELOW KNEE AMPUTATION   ANKLE SURGERY     APPLICATION OF WOUND VAC Left 10/16/2023   Procedure: APPLICATION, WOUND VAC;  Surgeon: Harden Jerona GAILS, MD;  Location: [facility] OR;  Service: Orthopedics;  Laterality: Left;   CERVICAL DISC SURGERY     C4-C5   ELBOW SURGERY     ESOPHAGOGASTRODUODENOSCOPY N/A 07/16/2017   Procedure: ESOPHAGOGASTRODUODENOSCOPY (EGD);  Surgeon: Avram Lupita BRAVO, MD;  Location: [facility]COPY;  Service: Endoscopy;  Laterality: N/A;   IRRIGATION AND DEBRIDEMENT FOOT Left 07/31/2023   Procedure: IRRIGATION  AND DEBRIDEMENT FOOT AND FIFTH MET RESCTION;  Surgeon: Malvin Marsa FALCON, DPM;  Location: [facility] OR;  Service: Orthopedics/Podiatry;  Laterality: Left;   RETINAL DETACHMENT SURGERY     RHINOPLASTY     VASECTOMY     WOUND DEBRIDEMENT Left 03/27/2018   Procedure: DEBRIDEMENT WOUND;  Surgeon: Gershon Donnice SAUNDERS, DPM;  Location: [facility] OR;  Service: Podiatry;  Laterality: Left;   Patient Active Problem List   Diagnosis Date Noted   Bipolar II disorder (HCC) 10/23/2023   Malnutrition of moderate degree 10/22/2023   Osteomyelitis of left foot (HCC) 10/21/2023   S/P BKA (below knee amputation) unilateral, left (HCC) 10/16/2023   Thrombophlebitis arm 08/03/2023   Hypokalemia 07/30/2023   Subacute osteomyelitis, left ankle and foot (HCC) 07/29/2023   Foot ulcer with fat layer exposed, left (HCC) 11/15/2019   Displaced fracture of shaft of left clavicle, initial encounter for closed fracture 03/15/2019   Dupuytren's contracture 07/29/2018   PICC (peripherally inserted central catheter) in place 05/24/2018   Neuropathy 05/12/2018   Osteomyelitis of ankle or foot, acute, left (HCC) 05/05/2018   Diabetic ulcer of left midfoot associated with type 2 diabetes mellitus, limited to breakdown of skin (HCC) 12/18/2017   Chronic pain syndrome 11/03/2017   Gastroesophageal reflux disease with esophagitis without hemorrhage 09/17/2017   Esophageal dysphagia    DKA, type 2 (HCC) 07/15/2017   ADD (  attention deficit disorder) 07/15/2017   DKA (diabetic ketoacidosis) (HCC) 07/15/2017   Vitamin B12 deficiency (non anemic) 08/13/2016   Adhesive capsulitis of left shoulder 01/18/2016   Trigger ring finger of right hand 10/11/2015   Primary osteoarthritis of first carpometacarpal joint of left hand 06/14/2015   Radial styloid tenosynovitis 06/14/2015   Depression 10/05/2014   Noncompliance with diabetes treatment 10/05/2014   Vitamin D deficiency 09/14/2014   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    Diabetic peripheral neuropathy associated with type 2 diabetes mellitus (HCC) 06/21/2014   Essential hypertension 06/21/2014   Erectile dysfunction associated with type 2 diabetes mellitus (HCC) 06/21/2014   Long-term current use of insulin  for diabetes mellitus (HCC) 06/21/2014   Adhesive capsulitis of right shoulder 03/24/2011   Retinal detachment 12/06/2010   Rotator cuff syndrome 12/06/2010   History of pancreatitis 10/22/2010   Hypertriglyceridemia 10/22/2010   Diabetes mellitus type II, uncontrolled 10/22/2010    ONSET DATE: 01/06/2024 (referral)   REFERRING DIAG: S10.487 (ICD-10-CM) - S/P BKA (below knee amputation) unilateral, left (HCC)  THERAPY DIAG:  Other abnormalities of gait and mobility  Muscle weakness (generalized)  Phantom limb syndrome without pain (HCC)  History of falling  Rationale for Evaluation and Treatment: Rehabilitation  SUBJECTIVE:   SUBJECTIVE STATEMENT: Pt presents w/RW and BKA socket, wearing 2 ply on his LLE. States he will see [facility] at [place] next Wednesday, at last [facility] visit Medford felt his redness on distal limb was normal and there was nothing to be adjusted, but pt reports he forgot to tell him everything he had discussed prior w/ therapy team.   Pt presents w/scabs all over shin of RLE, due to falls when transferring from bed to wheelchair without prosthetic on. Pt is unsure of number since last visit and reports he is unsure if he has ever hit his head.  Has still not reached out to his counselor or psychiatrist regarding ongoing medication management as well as other support needs.    Had his sister help him fix his toilet and reports the damage was not as bad as I thought in reference to his bottom wound.   Pt accompanied by: self  PERTINENT HISTORY: T2DM, CIDP, 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 Pierce), IV  antibiotics and hyperbaric tx ([facility]), status post below-knee amputation on the left from May 16  PAIN:  Are you having pain? Yes: NPRS scale: 4/10 Pain location: LLE  Pain description: phantom pain Aggravating factors: constant Relieving factors: unsure  PRECAUTIONS: Fall  RED FLAGS: None   WEIGHT BEARING RESTRICTIONS: No  FALLS: Has patient fallen in last 6 months? Yes. Number of falls a couple  LIVING ENVIRONMENT: Lives with: lives alone Lives in: House/apartment Home Access: Ramped entrance Home layout: One level Stairs: No Has following equipment at home: Environmental consultant - 2 wheeled, Wheelchair (manual), and Tour manager  PLOF: Independent and Leisure: Tennis and biking  PATIENT GOALS:  I wanna be able to run. I want to go above and beyond what I was doing before.  I would like to walk into the football game with either nothing or a cane (First weekend in September)   OBJECTIVE:  Note: Objective measures were completed at Evaluation unless otherwise noted.  DIAGNOSTIC FINDINGS: None relevant on file   COGNITION: Overall cognitive status: Within functional limits for tasks assessed   SENSATION: Pt reports frequent phantom pain in  LLE, most recently sensation is that his L ankle is sore, as if he has rolled his ankle   POSTURE: rounded shoulders  LOWER EXTREMITY ROM:  Active ROM Right eval Left eval  Hip flexion    Hip extension    Hip abduction    Hip adduction    Hip internal rotation    Hip external rotation    Knee flexion    Knee extension    Ankle dorsiflexion    Ankle plantarflexion    Ankle inversion    Ankle eversion     (Blank rows = not tested)  LOWER EXTREMITY MMT:  MMT Right eval Left eval  Hip flexion    Hip extension    Hip abduction    Hip adduction    Hip internal rotation    Hip external rotation    Knee flexion    Knee extension    Ankle dorsiflexion    Ankle plantarflexion    Ankle inversion    Ankle  eversion    (Blank rows = not tested)  BED MOBILITY:  Pt reports independence with this   TRANSFERS: Sit to stand: Modified independence Stand to sit: Modified independence Pt demonstrates significant weight shift to R side w/posterior instability, but is able to perform without AD   RAMP: Not assessed on eval   CURB: Not assessed on eval   GAIT:  Gait pattern: step through pattern and lateral hip instability Distance walked: Various short clinic distances Assistive device utilized: Environmental consultant - 2 wheeled Level of assistance: Modified independence Comments: Pt entered and exited clinic w/RW, no instability noted     FUNCTIONAL TESTS:      CURRENT PROSTHETIC WEAR ASSESSMENT: Patient is independent with: skin check, residual limb care, care of non-amputated limb, prosthetic cleaning, ply sock cleaning, and correct ply sock adjustment Patient is dependent with: proper wear schedule/adjustment and proper weight-bearing schedule/adjustment Donning prosthesis: Complete Independence Doffing prosthesis: Complete Independence Prosthetic wear tolerance: 8-10 hours/day, 7 days/week Prosthetic weight bearing tolerance: >45 minutes Residual limb condition: Intact residual limb w/well-healed incision, no adhesions noted w/good compliance to wearing shrinker. Pt w/long residual limb Prosthetic description: PTB BKA prosthetic w/Pin lock  K code/activity level with prosthetic use: Level 3                                                                                                                TREATMENT:  Self-care: Began discussing setup for bed to w/c squat pivot when pt self-initiates squat pivot mat > chair to right reporting severe nausea holding head in flexed position.  Provided emesis bag and confirmed pt has taken meds as prescribed, eaten, blood glucose 184, but does feel he is dehydrated.  He suddenly reports I should go and initiates stand to 2WW and ambulating to lobby.  PT  educates on benefit of calling for someone to come pick him up so he can go to urgent care or ED based on status change and prior falls/injuries.  He declines, PT educates on need to  stay upright w/ nausea and monitor blood glucose and BP as well as prioritize hydration as tolerated especially if vomiting.  Encouraged him to seek urgent medical follow-up if he feels his status worsen.  He verbalizes understanding.  PATIENT EDUCATION: Education details: See above. Person educated: Patient Education method: Explanation, Demonstration, Tactile cues, and Verbal cues Education comprehension: returned demonstration, verbal cues required, tactile cues required, and needs further education  HOME EXERCISE PROGRAM: Access Code: [email or web address] URL: [email or web address] Date: 01/19/2024 Prepared by: Marlon Plaster  Exercises - Diagonal Hip Extension with Resistance  - 1 x daily - 7 x weekly - 3 sets - 10 reps - Bird Dog  - 1 x daily - 7 x weekly - 3 sets - 10 reps - Side Stepping with Resistance at Thighs and Counter Support  - 1 x daily - 7 x weekly - 3 sets - 10 reps -Step taps at sink - Supine 90/90 Alternating Heel Touches with Posterior Pelvic Tilt  - 1 x daily - 7 x weekly - 3 sets - 10 reps -Wall bumps all directions x15 each direction once a day  ASSESSMENT:  CLINICAL IMPRESSION: Session severely limited by sudden onset of nausea.  PT remains concerned about poor medical management with pt reluctant to seek out necessary medical support which is hindering progress.  He refused offer of EMS or to call family member to monitor him at home or take him to urgent care.  He was educated on safety as above. PT will consider need to hold skilled services based on status at next visit as pt has had a marked decline of unclear etiology since initial visits.  Will determine further POC at next visit.  OBJECTIVE IMPAIRMENTS: Abnormal gait, decreased activity tolerance, decreased balance,  decreased endurance, decreased mobility, difficulty walking, decreased strength, impaired sensation, improper body mechanics, prosthetic dependency , and pain  ACTIVITY LIMITATIONS: carrying, lifting, bending, squatting, stairs, transfers, bed mobility, bathing, dressing, reach over head, hygiene/grooming, and locomotion level  PARTICIPATION LIMITATIONS: shopping, community activity, occupation, and yard work  PERSONAL FACTORS: Past/current experiences and 1-2 comorbidities: CMT and L BKA are also affecting patient's functional outcome.   REHAB POTENTIAL: Excellent  CLINICAL DECISION MAKING: Evolving/moderate complexity  EVALUATION COMPLEXITY: Moderate   GOALS: Goals reviewed with patient? Yes  SHORT TERM GOALS: Target date: 02/11/2024   Pt will be independent with initial HEP for improved strength, balance, transfers and gait. Baseline: not established on eval  Goal status: INITIAL  2.  Pt will demonstrate a gait speed of >/=2.22 feet/sec in order to decrease risk for falls. Baseline: 2.02 ft/sec w/ 2WW Goal status: INITIAL  3.  Pt will perform floor transfer w/CGA w/L prosthetic donned for improved fall recovery and safety at home  Baseline:  Goal status: INITIAL  4.  to be assessed and LTG updated  Baseline:  Goal status: INITIAL   LONG TERM GOALS: Target date: 02/25/2024    Pt will be independent with final HEP for improved strength, balance, transfers and gait.  Baseline:  Goal status: INITIAL  2.  Pt will demonstrate a gait speed of >/=2.42 feet/sec in order to decrease risk for falls. Baseline: 2.02 ft/sec w/ 2WW Goal status: INITIAL  3.  goal  Baseline:  Goal status: INITIAL  4.  Pt will perform floor transfer mod I  w/L prosthetic donned for improved fall recovery and safety at home  Baseline:  Goal status: INITIAL   PLAN:  PT FREQUENCY: 2x/week  PT DURATION: 6 weeks  PLANNED INTERVENTIONS: 97164- PT Re-evaluation, 97750- Physical  Performance Testing, 97110-Therapeutic exercises, 97530- Therapeutic activity, [id number]- Neuromuscular re-education, 97535- Self Care, [id number]- Manual therapy, [id number]- Gait training, [phone]- Orthotic/Prosthetic subsequent, [phone]- Aquatic Therapy, [phone]- Electrical stimulation (manual), [phone] (1-2 muscles), [id number] (3+ muscles)- Dry Needling, Patient/Family education, Balance training, Stair training, Joint mobilization, Spinal mobilization, Vestibular training, and DME instructions  PLAN FOR NEXT SESSION:  Add to high level HEP for weightbearing tolerance on LLE, glute strength and hip extension to assist w/gait. Stairs, walking w/reduced support on AD, walking on unlevel surfaces, staggered sit to stands, RDLs, core stability.  Check skin!  Work on squat pivots w/o prosthetic bed > w/c to decrease fall risk.  He would benefit from writing note to take to [facility] regarding prosthetic fit concerns - areas of non-blanchable redness?    Should PT hold until better medically managed?      Check all possible CPT codes: See Planned Interventions List for Planned CPT Codes    Check all conditions that are expected to impact treatment: Structural or anatomic abnormalities, Neurological condition and/or seizures, and Psychological or psychiatric disorders   If treatment provided at initial evaluation, no treatment charged due to lack of authorization.     Daved KATHEE Bull, PT, DPT 02/16/2024, 3:26 PM

## 2024-02-18 ENCOUNTER — Encounter: Payer: Self-pay | Admitting: Physical Therapy

## 2024-02-18 ENCOUNTER — Ambulatory Visit: Admitting: Physical Therapy

## 2024-02-18 VITALS — BP 141/77 | HR 96

## 2024-02-18 DIAGNOSIS — Z9181 History of falling: Secondary | ICD-10-CM

## 2024-02-18 DIAGNOSIS — M6281 Muscle weakness (generalized): Secondary | ICD-10-CM

## 2024-02-18 DIAGNOSIS — G547 Phantom limb syndrome without pain: Secondary | ICD-10-CM

## 2024-02-18 DIAGNOSIS — R2689 Other abnormalities of gait and mobility: Secondary | ICD-10-CM

## 2024-02-18 NOTE — Therapy (Addendum)
 OUTPATIENT PHYSICAL THERAPY PROSTHETICS TREATMENT   Patient Name: [name] MRN: [id number] DOB:[birth year], 58 y.o., male Today's Date: 02/18/2024  PCP: Fritzi Nest, MD REFERRING PROVIDER: Harden Jerona GAILS, MD  END OF SESSION:  PT End of Session - 02/18/24 1509     Visit Number 6    Number of Visits 13    Date for Recertification  03/03/24    Authorization Type [employer]    PT Start Time 1452    PT Stop Time 1534    PT Time Calculation (min) 42 min    Equipment Utilized During Treatment Other (comment);Gait belt   L BKA prosthetic   Activity Tolerance Patient tolerated treatment well    Behavior During Therapy [facility] for tasks assessed/performed;Flat affect;Impulsive          Past Medical History:  Diagnosis Date   ADHD (attention deficit hyperactivity disorder)    Anxiety and depression    Candida esophagitis (HCC)    in setting of DKA/hyperglycemia   CIDP (chronic inflammatory demyelinating polyneuropathy) (HCC)    Depression    Diabetes type 2, controlled (HCC)    Diverticulosis    Essential hypertension 06/21/2014   Hyperlipidemia    Kidney stones    Pancreatitis    Stomach ulcer    Vitamin D deficiency    Past Surgical History:  Procedure Laterality Date   AMPUTATION Left 10/16/2023   Procedure: LEFT BELOW KNEE AMPUTATION;  Surgeon: Harden Jerona GAILS, MD;  Location: [facility] OR;  Service: Orthopedics;  Laterality: Left;  LEFT BELOW KNEE AMPUTATION   ANKLE SURGERY     APPLICATION OF WOUND VAC Left 10/16/2023   Procedure: APPLICATION, WOUND VAC;  Surgeon: Harden Jerona GAILS, MD;  Location: [facility] OR;  Service: Orthopedics;  Laterality: Left;   CERVICAL DISC SURGERY     C4-C5   ELBOW SURGERY     ESOPHAGOGASTRODUODENOSCOPY N/A 07/16/2017   Procedure: ESOPHAGOGASTRODUODENOSCOPY (EGD);  Surgeon: Avram Lupita BRAVO, MD;  Location: [facility] ENDOSCOPY;  Service: Endoscopy;  Laterality: N/A;   IRRIGATION AND DEBRIDEMENT FOOT Left 07/31/2023   Procedure: IRRIGATION AND  DEBRIDEMENT FOOT AND FIFTH MET RESCTION;  Surgeon: Malvin Marsa FALCON, DPM;  Location: [facility] OR;  Service: Orthopedics/Podiatry;  Laterality: Left;   RETINAL DETACHMENT SURGERY     RHINOPLASTY     VASECTOMY     WOUND DEBRIDEMENT Left 03/27/2018   Procedure: DEBRIDEMENT WOUND;  Surgeon: Gershon Donnice SAUNDERS, DPM;  Location: [facility] OR;  Service: Podiatry;  Laterality: Left;   Patient Active Problem List   Diagnosis Date Noted   Bipolar II disorder (HCC) 10/23/2023   Malnutrition of moderate degree 10/22/2023   Osteomyelitis of left foot (HCC) 10/21/2023   S/P BKA (below knee amputation) unilateral, left (HCC) 10/16/2023   Thrombophlebitis arm 08/03/2023   Hypokalemia 07/30/2023   Subacute osteomyelitis, left ankle and foot (HCC) 07/29/2023   Foot ulcer with fat layer exposed, left (HCC) 11/15/2019   Displaced fracture of shaft of left clavicle, initial encounter for closed fracture 03/15/2019   Dupuytren's contracture 07/29/2018   PICC (peripherally inserted central catheter) in place 05/24/2018   Neuropathy 05/12/2018   Osteomyelitis of ankle or foot, acute, left (HCC) 05/05/2018   Diabetic ulcer of left midfoot associated with type 2 diabetes mellitus, limited to breakdown of skin (HCC) 12/18/2017   Chronic pain syndrome 11/03/2017   Gastroesophageal reflux disease with esophagitis without hemorrhage 09/17/2017   Esophageal dysphagia    DKA, type 2 (HCC) 07/15/2017   ADD (attention deficit disorder)  07/15/2017   DKA (diabetic ketoacidosis) (HCC) 07/15/2017   Vitamin B12 deficiency (non anemic) 08/13/2016   Adhesive capsulitis of left shoulder 01/18/2016   Trigger ring finger of right hand 10/11/2015   Primary osteoarthritis of first carpometacarpal joint of left hand 06/14/2015   Radial styloid tenosynovitis 06/14/2015   Depression 10/05/2014   Noncompliance with diabetes treatment 10/05/2014   Vitamin D deficiency 09/14/2014   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   Diabetic  peripheral neuropathy associated with type 2 diabetes mellitus (HCC) 06/21/2014   Essential hypertension 06/21/2014   Erectile dysfunction associated with type 2 diabetes mellitus (HCC) 06/21/2014   Long-term current use of insulin  for diabetes mellitus (HCC) 06/21/2014   Adhesive capsulitis of right shoulder 03/24/2011   Retinal detachment 12/06/2010   Rotator cuff syndrome 12/06/2010   History of pancreatitis 10/22/2010   Hypertriglyceridemia 10/22/2010   Diabetes mellitus type II, uncontrolled 10/22/2010    ONSET DATE: 01/06/2024 (referral)   REFERRING DIAG: S10.487 (ICD-10-CM) - S/P BKA (below knee amputation) unilateral, left (HCC)  THERAPY DIAG:  Other abnormalities of gait and mobility  Muscle weakness (generalized)  Phantom limb syndrome without pain (HCC)  History of falling  Rationale for Evaluation and Treatment: Rehabilitation  SUBJECTIVE:   SUBJECTIVE STATEMENT: Pt presents w/RW and BKA socket, wearing 1 ply on his LLE as he cannot get anything more on today. States he will see [place] at [place] next Wednesday.   Pt presents w/ more scabs all over shin of RLE and one open and bleeding area he was unaware of despite reporting no fall today.  He states he is doing so much better today and by the end of today you will feel that way too.   Has still not reached out to his counselor or psychiatrist regarding ongoing medication management as well as other support needs.     Pt accompanied by: self  PERTINENT HISTORY: T2DM, CIDP, 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 Pierce), IV antibiotics and hyperbaric tx ([facility]), status post below-knee amputation on the left from May 16  PAIN:  Are you having pain? Yes: NPRS scale: 4/10 Pain location: LLE  Pain description: phantom pain Aggravating factors: constant Relieving factors:  unsure  PRECAUTIONS: Fall  RED FLAGS: None   WEIGHT BEARING RESTRICTIONS: No  FALLS: Has patient fallen in last 6 months? Yes. Number of falls a couple  LIVING ENVIRONMENT: Lives with: lives alone Lives in: House/apartment Home Access: Ramped entrance Home layout: One level Stairs: No Has following equipment at home: Environmental consultant - 2 wheeled, Wheelchair (manual), and Tour manager  PLOF: Independent and Leisure: Tennis and biking  PATIENT GOALS:  I wanna be able to run. I want to go above and beyond what I was doing before.  I would like to walk into the football game with either nothing or a cane (First weekend in September)   OBJECTIVE:  Note: Objective measures were completed at Evaluation unless otherwise noted.  DIAGNOSTIC FINDINGS: None relevant on file   COGNITION: Overall cognitive status: Within functional limits for tasks assessed   SENSATION: Pt reports frequent phantom pain in LLE, most recently sensation is that his L ankle is sore, as if he has rolled his ankle   POSTURE: rounded shoulders  LOWER EXTREMITY ROM:  Active ROM Right eval Left eval  Hip flexion    Hip extension    Hip abduction  Hip adduction    Hip internal rotation    Hip external rotation    Knee flexion    Knee extension    Ankle dorsiflexion    Ankle plantarflexion    Ankle inversion    Ankle eversion     (Blank rows = not tested)  LOWER EXTREMITY MMT:  MMT Right eval Left eval  Hip flexion    Hip extension    Hip abduction    Hip adduction    Hip internal rotation    Hip external rotation    Knee flexion    Knee extension    Ankle dorsiflexion    Ankle plantarflexion    Ankle inversion    Ankle eversion    (Blank rows = not tested)  BED MOBILITY:  Pt reports independence with this   TRANSFERS: Sit to stand: Modified independence Stand to sit: Modified independence Pt demonstrates significant weight shift to R side w/posterior instability, but is able to  perform without AD   RAMP: Not assessed on eval   CURB: Not assessed on eval   GAIT:  Gait pattern: step through pattern and lateral hip instability Distance walked: Various short clinic distances Assistive device utilized: Environmental consultant - 2 wheeled Level of assistance: Modified independence Comments: Pt entered and exited clinic w/RW, no instability noted     FUNCTIONAL TESTS:      CURRENT PROSTHETIC WEAR ASSESSMENT: Patient is independent with: skin check, residual limb care, care of non-amputated limb, prosthetic cleaning, ply sock cleaning, and correct ply sock adjustment Patient is dependent with: proper wear schedule/adjustment and proper weight-bearing schedule/adjustment Donning prosthesis: Complete Independence Doffing prosthesis: Complete Independence Prosthetic wear tolerance: 8-10 hours/day, 7 days/week Prosthetic weight bearing tolerance: >45 minutes Residual limb condition: Intact residual limb w/well-healed incision, no adhesions noted w/good compliance to wearing shrinker. Pt w/long residual limb Prosthetic description: PTB BKA prosthetic w/Pin lock  K code/activity level with prosthetic use: Level 3                                                                                                                TREATMENT:  Self-care: PT cleansed and placed bandaid over open area of right shin Assessed L BP in sitting: Vitals:   02/18/24 1502  BP: (!) 141/77  Pulse: 96   Discussed feeling like he is falling backwards in current shoes, he is unsure if he needs to wear a different orthotic or shoe as he recently tried and felt he did not have that sense of LOB.  Encouraged him to try the orthotic from other shoe in his current shoe. Discussed sock ply fluctuation and other prosthetic concerns regarding fit and discomfort and typing note for him to take to [facility] (see pt instructions) Discussed recent concerns regarding behavioral components contributing to recent  falls/physical decline and lack of follow-up for continuity of care - pt is agreeable to PT reaching out to psychiatrist about recent concerns.  He reports his sister is a pediatric psychiatrist and is willing to follow-up regarding wound  on bottom from prior fall, but that he has been able to use a mirror to visualize the area and it is well healing.  Stressed the importance of medical follow-up for wound healing and monitoring this until fully healed - he endorses understanding. Pt requests new abdomen exercise:  deadbugs x10 each side  PATIENT EDUCATION: Education details: See above.  Discussed gait mechanics to work on with walker or cane:  step clearance and stride length, upright posture.  Discouraged walking without device at current. Person educated: Patient Education method: Explanation, Demonstration, Tactile cues, and Verbal cues Education comprehension: returned demonstration, verbal cues required, tactile cues required, and needs further education  HOME EXERCISE PROGRAM: Access Code: [email or web address] URL: [email or web address] Date: 01/19/2024 Prepared by: Marlon Plaster  Exercises - Diagonal Hip Extension with Resistance  - 1 x daily - 7 x weekly - 3 sets - 10 reps - Bird Dog  - 1 x daily - 7 x weekly - 3 sets - 10 reps - Side Stepping with Resistance at Thighs and Counter Support  - 1 x daily - 7 x weekly - 3 sets - 10 reps -Step taps at sink - Supine 90/90 Alternating Heel Touches with Posterior Pelvic Tilt  - 1 x daily - 7 x weekly - 3 sets - 10 reps -Wall bumps all directions x15 each direction once a day - Supine Dead Bug with Leg Extension  - 1 x daily - 7 x weekly - 1-2 sets - 10 reps  ASSESSMENT:  CLINICAL IMPRESSION: Session limited by pt impulsivity and need for frequent redirection to tasks.  He appears some better this session, but PT concerned for new and increased wounds on RLE secondary to falls with transfers.  Time spent addressing this and other recent  concerns for mental health and need for alternative medical follow-up.  PT to reach out on behalf of pt w/ pt permission.  Provided note to ease pt burden with communication to prosthetist for improved prosthetic fit.  Will work on improved transfer safety next visit.  OBJECTIVE IMPAIRMENTS: Abnormal gait, decreased activity tolerance, decreased balance, decreased endurance, decreased mobility, difficulty walking, decreased strength, impaired sensation, improper body mechanics, prosthetic dependency , and pain  ACTIVITY LIMITATIONS: carrying, lifting, bending, squatting, stairs, transfers, bed mobility, bathing, dressing, reach over head, hygiene/grooming, and locomotion level  PARTICIPATION LIMITATIONS: shopping, community activity, occupation, and yard work  PERSONAL FACTORS: Past/current experiences and 1-2 comorbidities: CMT and L BKA are also affecting patient's functional outcome.   REHAB POTENTIAL: Excellent  CLINICAL DECISION MAKING: Evolving/moderate complexity  EVALUATION COMPLEXITY: Moderate   GOALS: Goals reviewed with patient? Yes  SHORT TERM GOALS: Target date: 02/11/2024   Pt will be independent with initial HEP for improved strength, balance, transfers and gait. Baseline: not established on eval  Goal status: INITIAL  2.  Pt will demonstrate a gait speed of >/=2.22 feet/sec in order to decrease risk for falls. Baseline: 2.02 ft/sec w/ 2WW Goal status: INITIAL  3.  Pt will perform floor transfer w/CGA w/L prosthetic donned for improved fall recovery and safety at home  Baseline:  Goal status: INITIAL  4.  to be assessed and LTG updated  Baseline:  Goal status: INITIAL   LONG TERM GOALS: Target date: 02/25/2024    Pt will be independent with final HEP for improved strength, balance, transfers and gait.  Baseline:  Goal status: INITIAL  2.  Pt will demonstrate a gait speed of >/=2.42 feet/sec in order to decrease risk for falls. Baseline:  2.02 ft/sec  w/ 2WW Goal status: INITIAL  3.  goal  Baseline:  Goal status: INITIAL  4.  Pt will perform floor transfer mod I  w/L prosthetic donned for improved fall recovery and safety at home  Baseline:  Goal status: INITIAL   PLAN:  PT FREQUENCY: 2x/week  PT DURATION: 6 weeks  PLANNED INTERVENTIONS: [id number]- PT Re-evaluation, 97750- Physical Performance Testing, 97110-Therapeutic exercises, 97530- Therapeutic activity, [id number]- Neuromuscular re-education, 97535- Self Care, [id number]- Manual therapy, [id number]- Gait training, [phone]- Orthotic/Prosthetic subsequent, [id number]- Aquatic Therapy, [phone]- Electrical stimulation (manual), [phone] (1-2 muscles), [id number] (3+ muscles)- Dry Needling, Patient/Family education, Balance training, Stair training, Joint mobilization, Spinal mobilization, Vestibular training, and DME instructions  PLAN FOR NEXT SESSION:  Add to high level HEP for weightbearing tolerance on LLE, glute strength and hip extension to assist w/gait. Stairs, walking w/reduced support on AD, walking on unlevel surfaces, staggered sit to stands, RDLs, core stability.  Check skin!  Work on squat pivots w/o prosthetic bed > w/c to decrease fall risk.   02/22/2024:  PT spoke to Dr. Talmadge nurse at [facility] regarding pt change in status and PT concerns for physical safety regarding impulsivity, distractibility, general wellbeing and home management as compared to prior flat behavior in recent weeks.  Nurse reports they will likely call pt for follow-up appt.  Daved Bull, PT, DPT    Check all possible CPT codes: See Planned Interventions List for Planned CPT Codes    Check all conditions that are expected to impact treatment: Structural or anatomic abnormalities, Neurological condition and/or seizures, and Psychological or psychiatric disorders   If treatment provided at initial evaluation, no treatment charged due to lack of authorization.     Daved KATHEE Bull, PT,  DPT 02/18/2024, 5:40 PM

## 2024-02-18 NOTE — Patient Instructions (Signed)
 [name] is being seen at [facility] for prosthetic rehab.  He is having trouble getting short pin out of socket as it intermittently gets stuck - unsure if socks are getting stuck on pin, but he is aware to check for this.  He is bottoming out in his socket and simultaneously having trouble getting additional ply on to pad the distal residual limb.  He has some blanchable redness on the distal portion of his fibula and reports consistent area of pain here.  He has intermittent blanchable redness over kneecap as well because he is bottoming out and unable to adjust socks as needed.    Any feedback on improved socket fit to improve pt wear tolerance is appreciated.  Thank you, Daved Bull, PT, DPT

## 2024-02-22 ENCOUNTER — Telehealth: Payer: Self-pay | Admitting: Physical Therapy

## 2024-02-22 NOTE — Telephone Encounter (Signed)
 PT spoke with Dr. KANDICE nurse regarding pt follow-up.  Daved Bull, PT, DPT

## 2024-02-23 ENCOUNTER — Ambulatory Visit: Admitting: Physical Therapy

## 2024-02-23 ENCOUNTER — Encounter: Payer: Self-pay | Admitting: Physical Therapy

## 2024-02-23 VITALS — BP 138/80 | HR 73

## 2024-02-23 DIAGNOSIS — R2689 Other abnormalities of gait and mobility: Secondary | ICD-10-CM | POA: Diagnosis not present

## 2024-02-23 DIAGNOSIS — G547 Phantom limb syndrome without pain: Secondary | ICD-10-CM

## 2024-02-23 DIAGNOSIS — Z9181 History of falling: Secondary | ICD-10-CM

## 2024-02-23 DIAGNOSIS — M6281 Muscle weakness (generalized): Secondary | ICD-10-CM

## 2024-02-23 NOTE — Therapy (Signed)
 OUTPATIENT PHYSICAL THERAPY PROSTHETICS TREATMENT   Patient Name: [name] MRN: [id number] DOB:[birth year], 58 y.o., male Today's Date: 02/23/2024  PCP: Fritzi Nest, MD REFERRING PROVIDER: Harden Jerona GAILS, MD  END OF SESSION:  PT End of Session - 02/23/24 1338     Visit Number 7    Number of Visits 13    Date for Recertification  03/03/24    Authorization Type [employer]    PT Start Time 1321    PT Stop Time 1403    PT Time Calculation (min) 42 min    Equipment Utilized During Treatment Other (comment);Gait belt   L BKA prosthetic   Activity Tolerance Patient tolerated treatment well    Behavior During Therapy WFL for tasks assessed/performed          Past Medical History:  Diagnosis Date   ADHD (attention deficit hyperactivity disorder)    Anxiety and depression    Candida esophagitis (HCC)    in setting of DKA/hyperglycemia   CIDP (chronic inflammatory demyelinating polyneuropathy) (HCC)    Depression    Diabetes type 2, controlled (HCC)    Diverticulosis    Essential hypertension 06/21/2014   Hyperlipidemia    Kidney stones    Pancreatitis    Stomach ulcer    Vitamin D deficiency    Past Surgical History:  Procedure Laterality Date   AMPUTATION Left 10/16/2023   Procedure: LEFT BELOW KNEE AMPUTATION;  Surgeon: Harden Jerona GAILS, MD;  Location: [facility] OR;  Service: Orthopedics;  Laterality: Left;  LEFT BELOW KNEE AMPUTATION   ANKLE SURGERY     APPLICATION OF WOUND VAC Left 10/16/2023   Procedure: APPLICATION, WOUND VAC;  Surgeon: Harden Jerona GAILS, MD;  Location: [facility] OR;  Service: Orthopedics;  Laterality: Left;   CERVICAL DISC SURGERY     C4-C5   ELBOW SURGERY     ESOPHAGOGASTRODUODENOSCOPY N/A 07/16/2017   Procedure: ESOPHAGOGASTRODUODENOSCOPY (EGD);  Surgeon: Avram Lupita BRAVO, MD;  Location: [facility] ENDOSCOPY;  Service: Endoscopy;  Laterality: N/A;   IRRIGATION AND DEBRIDEMENT FOOT Left 07/31/2023   Procedure: IRRIGATION AND DEBRIDEMENT FOOT AND FIFTH  MET RESCTION;  Surgeon: Malvin Marsa FALCON, DPM;  Location: [facility] OR;  Service: Orthopedics/Podiatry;  Laterality: Left;   RETINAL DETACHMENT SURGERY     RHINOPLASTY     VASECTOMY     WOUND DEBRIDEMENT Left 03/27/2018   Procedure: DEBRIDEMENT WOUND;  Surgeon: Gershon Donnice SAUNDERS, DPM;  Location: [facility] OR;  Service: Podiatry;  Laterality: Left;   Patient Active Problem List   Diagnosis Date Noted   Bipolar II disorder (HCC) 10/23/2023   Malnutrition of moderate degree 10/22/2023   Osteomyelitis of left foot (HCC) 10/21/2023   S/P BKA (below knee amputation) unilateral, left (HCC) 10/16/2023   Thrombophlebitis arm 08/03/2023   Hypokalemia 07/30/2023   Subacute osteomyelitis, left ankle and foot (HCC) 07/29/2023   Foot ulcer with fat layer exposed, left (HCC) 11/15/2019   Displaced fracture of shaft of left clavicle, initial encounter for closed fracture 03/15/2019   Dupuytren's contracture 07/29/2018   PICC (peripherally inserted central catheter) in place 05/24/2018   Neuropathy 05/12/2018   Osteomyelitis of ankle or foot, acute, left (HCC) 05/05/2018   Diabetic ulcer of left midfoot associated with type 2 diabetes mellitus, limited to breakdown of skin (HCC) 12/18/2017   Chronic pain syndrome 11/03/2017   Gastroesophageal reflux disease with esophagitis without hemorrhage 09/17/2017   Esophageal dysphagia    DKA, type 2 (HCC) 07/15/2017   ADD (attention deficit disorder) 07/15/2017  DKA (diabetic ketoacidosis) (HCC) 07/15/2017   Vitamin B12 deficiency (non anemic) 08/13/2016   Adhesive capsulitis of left shoulder 01/18/2016   Trigger ring finger of right hand 10/11/2015   Primary osteoarthritis of first carpometacarpal joint of left hand 06/14/2015   Radial styloid tenosynovitis 06/14/2015   Depression 10/05/2014   Noncompliance with diabetes treatment 10/05/2014   Vitamin D deficiency 09/14/2014   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   Diabetic peripheral neuropathy  associated with type 2 diabetes mellitus (HCC) 06/21/2014   Essential hypertension 06/21/2014   Erectile dysfunction associated with type 2 diabetes mellitus (HCC) 06/21/2014   Long-term current use of insulin  for diabetes mellitus (HCC) 06/21/2014   Adhesive capsulitis of right shoulder 03/24/2011   Retinal detachment 12/06/2010   Rotator cuff syndrome 12/06/2010   History of pancreatitis 10/22/2010   Hypertriglyceridemia 10/22/2010   Diabetes mellitus type II, uncontrolled 10/22/2010    ONSET DATE: 01/06/2024 (referral)   REFERRING DIAG: S10.487 (ICD-10-CM) - S/P BKA (below knee amputation) unilateral, left (HCC)  THERAPY DIAG:  Other abnormalities of gait and mobility  Muscle weakness (generalized)  Phantom limb syndrome without pain (HCC)  History of falling  Rationale for Evaluation and Treatment: Rehabilitation  SUBJECTIVE:   SUBJECTIVE STATEMENT: Pt presents w/o AD and BKA socket, wearing no ply on his LLE as he cannot get anything more on today. He retains appt with [facility] tomorrow.  He has been having trouble getting prosthetic off limb resulting in having to sleep with prosthesis on Friday to Saturday.  He endorses he has been so out-of-it and coming to appts at wrong times because he has been trying to find the right sleep medication.  He has a follow-up with psychiatrist this Friday and spoke to him on the phone recently as well. No recent falls.  Blood sugar 184 at onset of session.    Pt accompanied by: self  PERTINENT HISTORY: T2DM, CIDP, 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 Pierce), IV antibiotics and hyperbaric tx ([facility]), status post below-knee amputation on the left from May 16  PAIN:  Are you having pain? Yes: NPRS scale: 4-5/10 Pain location: LLE  Pain description: phantom pain Aggravating factors: constant Relieving factors:  unsure  PRECAUTIONS: Fall  RED FLAGS: None   WEIGHT BEARING RESTRICTIONS: No  FALLS: Has patient fallen in last 6 months? Yes. Number of falls a couple  LIVING ENVIRONMENT: Lives with: lives alone Lives in: House/apartment Home Access: Ramped entrance Home layout: One level Stairs: No Has following equipment at home: Environmental consultant - 2 wheeled, Wheelchair (manual), and Tour manager  PLOF: Independent and Leisure: Tennis and biking  PATIENT GOALS:  I wanna be able to run. I want to go above and beyond what I was doing before.  I would like to walk into the football game with either nothing or a cane (First weekend in September)   OBJECTIVE:  Note: Objective measures were completed at Evaluation unless otherwise noted.  DIAGNOSTIC FINDINGS: None relevant on file   COGNITION: Overall cognitive status: Within functional limits for tasks assessed   SENSATION: Pt reports frequent phantom pain in LLE, most recently sensation is that his L ankle is sore, as if he has rolled his ankle   POSTURE: rounded shoulders  LOWER EXTREMITY ROM:  Active ROM Right eval Left eval  Hip flexion    Hip extension    Hip abduction  Hip adduction    Hip internal rotation    Hip external rotation    Knee flexion    Knee extension    Ankle dorsiflexion    Ankle plantarflexion    Ankle inversion    Ankle eversion     (Blank rows = not tested)  LOWER EXTREMITY MMT:  MMT Right eval Left eval  Hip flexion    Hip extension    Hip abduction    Hip adduction    Hip internal rotation    Hip external rotation    Knee flexion    Knee extension    Ankle dorsiflexion    Ankle plantarflexion    Ankle inversion    Ankle eversion    (Blank rows = not tested)  BED MOBILITY:  Pt reports independence with this   TRANSFERS: Sit to stand: Modified independence Stand to sit: Modified independence Pt demonstrates significant weight shift to R side w/posterior instability, but is able to  perform without AD   RAMP: Not assessed on eval   CURB: Not assessed on eval   GAIT:  Gait pattern: step through pattern and lateral hip instability Distance walked: Various short clinic distances Assistive device utilized: Environmental consultant - 2 wheeled Level of assistance: Modified independence Comments: Pt entered and exited clinic w/RW, no instability noted     FUNCTIONAL TESTS:      CURRENT PROSTHETIC WEAR ASSESSMENT: Patient is independent with: skin check, residual limb care, care of non-amputated limb, prosthetic cleaning, ply sock cleaning, and correct ply sock adjustment Patient is dependent with: proper wear schedule/adjustment and proper weight-bearing schedule/adjustment Donning prosthesis: Complete Independence Doffing prosthesis: Complete Independence Prosthetic wear tolerance: 8-10 hours/day, 7 days/week Prosthetic weight bearing tolerance: >45 minutes Residual limb condition: Intact residual limb w/well-healed incision, no adhesions noted w/good compliance to wearing shrinker. Pt w/long residual limb Prosthetic description: PTB BKA prosthetic w/Pin lock  K code/activity level with prosthetic use: Level 3                                                                                                                TREATMENT:  TA: PT grabbed gait belt and ambulated w/ pt w/o AD to car fto retrieve cane - 2 instances of crossover stepping and cues for managing parking block stepover (catches prosthetic toe on way back w/o LOB), discussed using cane today but practicing over level indoor surfaces at home near counter or other support surface if not using AD, using 2WW on days where he is not moving as well  Self-care: Assessed L BP in sitting: Vitals:   02/23/24 1335  BP: 138/80  Pulse: 73   Discussed shoewear today w/ improved feeling of upright Discussed maintaining appt w/ [facility] for prosthetic concerns (reprinted note per pt request and addended (see pt instructions)  to add pin getting stuck and pt having to sleep in prosthetic Friday night) - discussed checking tibial tuberosity bump in socket is aligned prior to trying to pull off Discussed sock ply fluctuation and why he can  only fit liner on today due to limb volume from not wearing shrinker recently - wear shrinker when prosthetic not on Provided resources to look into virtual personal training for amputees through [employer] and [employer] - discussed safety and holding off on this until more consistently balanced/medications adjusted to better serve him/more consistent w/ PT and HEP - pt verbalized understanding  NMR: RDL to modified 12 in height 10 lb x12 > lateral crunch x15 each side > repeated RDLs and lateral crunches same reps w/ 15 lbs, more stepping strategy utilized to stay upright w/ pt requiring seated recovery x2 to complete left lateral crunches due to fatigue  PATIENT EDUCATION: Education details: See above.  Discussed gait mechanics to work on with walker or cane:  step clearance and stride length, upright posture.  Discouraged walking without device at current.  Discussed possible hold next session - pt would like to continue but will provide more insight after seeing psychiatrist Friday.  Continue HEP. Person educated: Patient Education method: Explanation, Demonstration, Tactile cues, and Verbal cues Education comprehension: returned demonstration, verbal cues required, tactile cues required, and needs further education  HOME EXERCISE PROGRAM: Access Code: [email or web address] URL: [email or web address] Date: 01/19/2024 Prepared by: Marlon Plaster  Exercises - Diagonal Hip Extension with Resistance  - 1 x daily - 7 x weekly - 3 sets - 10 reps - Bird Dog  - 1 x daily - 7 x weekly - 3 sets - 10 reps - Side Stepping with Resistance at Thighs and Counter Support  - 1 x daily - 7 x weekly - 3 sets - 10 reps -Step taps at sink - Supine 90/90 Alternating Heel Touches with  Posterior Pelvic Tilt  - 1 x daily - 7 x weekly - 3 sets - 10 reps -Wall bumps all directions x15 each direction once a day - Supine Dead Bug with Leg Extension  - 1 x daily - 7 x weekly - 1-2 sets - 10 reps  ASSESSMENT:  CLINICAL IMPRESSION: Pt better appearing today, but continues to make risky choices by not ambulating with AD despite prior education and falls.  He was agreeable to retrieving cane from vehicle w/ PT assist and did demonstrate improved gait mechanics without AD, but not to level of independence so continuing to recommend cane today with education on fluctuation in assistance needs.  Extensive education and discussion on varying factors impacting POC and next steps (see above for details).  He is due for goal assessment to determine further POC next visit - will be able to determine further benefit following upcoming psychiatrist follow-up.    OBJECTIVE IMPAIRMENTS: Abnormal gait, decreased activity tolerance, decreased balance, decreased endurance, decreased mobility, difficulty walking, decreased strength, impaired sensation, improper body mechanics, prosthetic dependency , and pain  ACTIVITY LIMITATIONS: carrying, lifting, bending, squatting, stairs, transfers, bed mobility, bathing, dressing, reach over head, hygiene/grooming, and locomotion level  PARTICIPATION LIMITATIONS: shopping, community activity, occupation, and yard work  PERSONAL FACTORS: Past/current experiences and 1-2 comorbidities: CMT and L BKA are also affecting patient's functional outcome.   REHAB POTENTIAL: Excellent  CLINICAL DECISION MAKING: Evolving/moderate complexity  EVALUATION COMPLEXITY: Moderate   GOALS: Goals reviewed with patient? Yes  SHORT TERM GOALS: Target date: 02/11/2024   Pt will be independent with initial HEP for improved strength, balance, transfers and gait. Baseline: not established on eval  Goal status: INITIAL  2.  Pt will demonstrate a gait speed of >/=2.22 feet/sec  in order to decrease risk for falls. Baseline: 2.02 ft/sec w/  2WW Goal status: INITIAL  3.  Pt will perform floor transfer w/CGA w/L prosthetic donned for improved fall recovery and safety at home  Baseline:  Goal status: INITIAL  4.  to be assessed and LTG updated  Baseline:  Goal status: INITIAL   LONG TERM GOALS: Target date: 02/25/2024    Pt will be independent with final HEP for improved strength, balance, transfers and gait.  Baseline:  Goal status: INITIAL  2.  Pt will demonstrate a gait speed of >/=2.42 feet/sec in order to decrease risk for falls. Baseline: 2.02 ft/sec w/ 2WW Goal status: INITIAL  3.  goal  Baseline:  Goal status: INITIAL  4.  Pt will perform floor transfer mod I  w/L prosthetic donned for improved fall recovery and safety at home  Baseline:  Goal status: INITIAL   PLAN:  PT FREQUENCY: 2x/week  PT DURATION: 6 weeks  PLANNED INTERVENTIONS: [id number]- PT Re-evaluation, 97750- Physical Performance Testing, 97110-Therapeutic exercises, 97530- Therapeutic activity, [id number]- Neuromuscular re-education, 97535- Self Care, [id number]- Manual therapy, [id number]- Gait training, [phone]- Orthotic/Prosthetic subsequent, [id number]- Aquatic Therapy, [phone]- Electrical stimulation (manual), [phone] (1-2 muscles), [id number] (3+ muscles)- Dry Needling, Patient/Family education, Balance training, Stair training, Joint mobilization, Spinal mobilization, Vestibular training, and DME instructions  PLAN FOR NEXT SESSION:  Add to high level HEP for weightbearing tolerance on LLE, glute strength and hip extension to assist w/gait. Stairs, walking w/reduced support on AD, walking on unlevel surfaces, staggered sit to stands, RDLs, core stability.  Check skin!  Work on squat pivots w/o prosthetic bed > w/c to decrease fall risk.   02/23/2024:  ASSESS LTGs - Re-cert?  Pt would like to continue visits, but will provide further insight following psychiatric follow-up.  Daved Bull, PT,  DPT    Check all possible CPT codes: See Planned Interventions List for Planned CPT Codes    Check all conditions that are expected to impact treatment: Structural or anatomic abnormalities, Neurological condition and/or seizures, and Psychological or psychiatric disorders   If treatment provided at initial evaluation, no treatment charged due to lack of authorization.     Daved KATHEE Bull, PT, DPT 02/23/2024, 2:19 PM

## 2024-02-23 NOTE — Patient Instructions (Signed)
 Letter addendum: He is also curious if he would benefit from a smaller shrinker as his comes off in the middle of the night.  His pin has also got stuck in socket a couple times resulting in sleeping in the prosthetic.  If you could assess/adjust this that would be great!

## 2024-02-25 ENCOUNTER — Ambulatory Visit: Admitting: Physical Therapy

## 2024-02-25 DIAGNOSIS — M6281 Muscle weakness (generalized): Secondary | ICD-10-CM

## 2024-02-25 DIAGNOSIS — Z9181 History of falling: Secondary | ICD-10-CM

## 2024-02-25 DIAGNOSIS — G547 Phantom limb syndrome without pain: Secondary | ICD-10-CM

## 2024-02-25 DIAGNOSIS — R2689 Other abnormalities of gait and mobility: Secondary | ICD-10-CM | POA: Diagnosis not present

## 2024-02-25 NOTE — Therapy (Signed)
 OUTPATIENT PHYSICAL THERAPY PROSTHETICS TREATMENT   Patient Name: [name] MRN: [id number] DOB:[birth year], 58 y.o., male Today's Date: 02/25/2024  PCP: Fritzi Nest, MD REFERRING PROVIDER: Harden Jerona GAILS, MD  END OF SESSION:  PT End of Session - 02/25/24 1320     Visit Number 8    Number of Visits 13    Date for Recertification  03/03/24    Authorization Type [employer]    PT Start Time 1317    PT Stop Time 1357    PT Time Calculation (min) 40 min    Equipment Utilized During Treatment Other (comment)   L BKA prosthetic   Activity Tolerance Patient tolerated treatment well    Behavior During Therapy Restless;Flat affect;Anxious;Lability           Past Medical History:  Diagnosis Date   ADHD (attention deficit hyperactivity disorder)    Anxiety and depression    Candida esophagitis (HCC)    in setting of DKA/hyperglycemia   CIDP (chronic inflammatory demyelinating polyneuropathy) (HCC)    Depression    Diabetes type 2, controlled (HCC)    Diverticulosis    Essential hypertension 06/21/2014   Hyperlipidemia    Kidney stones    Pancreatitis    Stomach ulcer    Vitamin D deficiency    Past Surgical History:  Procedure Laterality Date   AMPUTATION Left 10/16/2023   Procedure: LEFT BELOW KNEE AMPUTATION;  Surgeon: Harden Jerona GAILS, MD;  Location: [facility] OR;  Service: Orthopedics;  Laterality: Left;  LEFT BELOW KNEE AMPUTATION   ANKLE SURGERY     APPLICATION OF WOUND VAC Left 10/16/2023   Procedure: APPLICATION, WOUND VAC;  Surgeon: Harden Jerona GAILS, MD;  Location: [facility] OR;  Service: Orthopedics;  Laterality: Left;   CERVICAL DISC SURGERY     C4-C5   ELBOW SURGERY     ESOPHAGOGASTRODUODENOSCOPY N/A 07/16/2017   Procedure: ESOPHAGOGASTRODUODENOSCOPY (EGD);  Surgeon: Avram Lupita BRAVO, MD;  Location: [facility] ENDOSCOPY;  Service: Endoscopy;  Laterality: N/A;   IRRIGATION AND DEBRIDEMENT FOOT Left 07/31/2023   Procedure: IRRIGATION AND DEBRIDEMENT FOOT AND FIFTH  MET RESCTION;  Surgeon: Malvin Marsa FALCON, DPM;  Location: [facility] OR;  Service: Orthopedics/Podiatry;  Laterality: Left;   RETINAL DETACHMENT SURGERY     RHINOPLASTY     VASECTOMY     WOUND DEBRIDEMENT Left 03/27/2018   Procedure: DEBRIDEMENT WOUND;  Surgeon: Gershon Donnice SAUNDERS, DPM;  Location: [facility] OR;  Service: Podiatry;  Laterality: Left;   Patient Active Problem List   Diagnosis Date Noted   Bipolar II disorder (HCC) 10/23/2023   Malnutrition of moderate degree 10/22/2023   Osteomyelitis of left foot (HCC) 10/21/2023   S/P BKA (below knee amputation) unilateral, left (HCC) 10/16/2023   Thrombophlebitis arm 08/03/2023   Hypokalemia 07/30/2023   Subacute osteomyelitis, left ankle and foot (HCC) 07/29/2023   Foot ulcer with fat layer exposed, left (HCC) 11/15/2019   Displaced fracture of shaft of left clavicle, initial encounter for closed fracture 03/15/2019   Dupuytren's contracture 07/29/2018   PICC (peripherally inserted central catheter) in place 05/24/2018   Neuropathy 05/12/2018   Osteomyelitis of ankle or foot, acute, left (HCC) 05/05/2018   Diabetic ulcer of left midfoot associated with type 2 diabetes mellitus, limited to breakdown of skin (HCC) 12/18/2017   Chronic pain syndrome 11/03/2017   Gastroesophageal reflux disease with esophagitis without hemorrhage 09/17/2017   Esophageal dysphagia    DKA, type 2 (HCC) 07/15/2017   ADD (attention deficit disorder) 07/15/2017  DKA (diabetic ketoacidosis) (HCC) 07/15/2017   Vitamin B12 deficiency (non anemic) 08/13/2016   Adhesive capsulitis of left shoulder 01/18/2016   Trigger ring finger of right hand 10/11/2015   Primary osteoarthritis of first carpometacarpal joint of left hand 06/14/2015   Radial styloid tenosynovitis 06/14/2015   Depression 10/05/2014   Noncompliance with diabetes treatment 10/05/2014   Vitamin D deficiency 09/14/2014   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   Diabetic peripheral neuropathy  associated with type 2 diabetes mellitus (HCC) 06/21/2014   Essential hypertension 06/21/2014   Erectile dysfunction associated with type 2 diabetes mellitus (HCC) 06/21/2014   Long-term current use of insulin  for diabetes mellitus (HCC) 06/21/2014   Adhesive capsulitis of right shoulder 03/24/2011   Retinal detachment 12/06/2010   Rotator cuff syndrome 12/06/2010   History of pancreatitis 10/22/2010   Hypertriglyceridemia 10/22/2010   Diabetes mellitus type II, uncontrolled 10/22/2010    ONSET DATE: 01/06/2024 (referral)   REFERRING DIAG: S10.487 (ICD-10-CM) - S/P BKA (below knee amputation) unilateral, left (HCC)  THERAPY DIAG:  Other abnormalities of gait and mobility  Muscle weakness (generalized)  Phantom limb syndrome without pain (HCC)  History of falling  Rationale for Evaluation and Treatment: Rehabilitation  SUBJECTIVE:   SUBJECTIVE STATEMENT: Pt presents w/RW and L BKA socket w/1ply sock. You guys freaked out so badly last visit I figured I would bring the RW back. Has had 2 falls since last visit. First fall occurred when transferring from bed to [facility]. Second fall happened in the bathroom and lost his balance posteriorly through the doorway and hit the floor/wall pretty hard. Denies pain today. Saw his psychiatrist yesterday and is working with his medications. Did not see Chris yesterday at [facility], just went to have his pin lock adjusted.   Pt accompanied by: self  PERTINENT HISTORY: T2DM, CIDP, 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 Pierce), IV antibiotics and hyperbaric tx ([facility]), status post below-knee amputation on the left from May 16  PAIN:  Are you having pain? No  PRECAUTIONS: Fall  RED FLAGS: None   WEIGHT BEARING RESTRICTIONS: No  FALLS: Has patient fallen in last 6 months? Yes. Number of falls a couple  LIVING  ENVIRONMENT: Lives with: lives alone Lives in: House/apartment Home Access: Ramped entrance Home layout: One level Stairs: No Has following equipment at home: Environmental consultant - 2 wheeled, Wheelchair (manual), and Tour manager  PLOF: Independent and Leisure: Tennis and biking  PATIENT GOALS:  I wanna be able to run. I want to go above and beyond what I was doing before.  I would like to walk into the football game with either nothing or a cane (First weekend in September)   OBJECTIVE:  Note: Objective measures were completed at Evaluation unless otherwise noted.  DIAGNOSTIC FINDINGS: None relevant on file   COGNITION: Overall cognitive status: Within functional limits for tasks assessed   SENSATION: Pt reports frequent phantom pain in LLE, most recently sensation is that his L ankle is sore, as if he has rolled his ankle   POSTURE: rounded shoulders  LOWER EXTREMITY ROM:  Active ROM Right eval Left eval  Hip flexion    Hip extension    Hip abduction    Hip adduction    Hip internal rotation    Hip external rotation    Knee flexion    Knee extension    Ankle dorsiflexion  Ankle plantarflexion    Ankle inversion    Ankle eversion     (Blank rows = not tested)  LOWER EXTREMITY MMT:  MMT Right eval Left eval  Hip flexion    Hip extension    Hip abduction    Hip adduction    Hip internal rotation    Hip external rotation    Knee flexion    Knee extension    Ankle dorsiflexion    Ankle plantarflexion    Ankle inversion    Ankle eversion    (Blank rows = not tested)  BED MOBILITY:  Pt reports independence with this   TRANSFERS: Sit to stand: Modified independence Stand to sit: Modified independence Pt demonstrates significant weight shift to R side w/posterior instability, but is able to perform without AD   RAMP: Not assessed on eval   CURB: Not assessed on eval   GAIT:  Gait pattern: step through pattern and lateral hip instability Distance walked:  Various short clinic distances Assistive device utilized: Environmental consultant - 2 wheeled Level of assistance: Modified independence Comments: Pt entered and exited clinic w/RW, no instability noted     FUNCTIONAL TESTS:      CURRENT PROSTHETIC WEAR ASSESSMENT: Patient is independent with: skin check, residual limb care, care of non-amputated limb, prosthetic cleaning, ply sock cleaning, and correct ply sock adjustment Patient is dependent with: proper wear schedule/adjustment and proper weight-bearing schedule/adjustment Donning prosthesis: Complete Independence Doffing prosthesis: Complete Independence Prosthetic wear tolerance: 8-10 hours/day, 7 days/week Prosthetic weight bearing tolerance: >45 minutes Residual limb condition: Intact residual limb w/well-healed incision, no adhesions noted w/good compliance to wearing shrinker. Pt w/long residual limb Prosthetic description: PTB BKA prosthetic w/Pin lock  K code/activity level with prosthetic use: Level 3                                                                                                                TREATMENT:  Self-care: Reviewed proper squat pivot technique and had pt demonstrate between mat <>chair. Pt reports he has been placing unlocked WC directly in front of him at home and placing hands on both armrests and attempting to stand and pivot 180 degrees to chair and frequently falling in floor. Reviewed proper set up of squat pivot (placing chair at 45 degree angle beside bed, removing arm rest on same side of transfer) and pt able to perform x2 w/SBA. Pt reports he forgot this is how to set up transfer and forgot he could remove arm rests.  Continue to inform pt that he needs to use AD at all times for reduced fall risk and safety. Pt reports he has not been using his AD at home for a while. Informed pt he is not safe without AD due to recurrent falls, improper sock management and impaired safety awareness/insight to deficits.  Pt reports he will start using AD more consistently at home.  Discussed PT POC and recommended pt pause for a week or two in order to become more medically stable and be ready  for the rehab process. Pt very argumentative about this, stating he has a trip planned to [place]  in November that he will not be ready for. When inquiring what pt meant by this, pt could not fully answer initially, but later stated he does not want to bring AD to [place] . Informed pt that he needs to be more independent and consistent w/sock management and overall safety awareness prior to being able to walk without AD. Pt reports he has not been educated on sock management, but reminded pt that this has been a discussion during multiple PT visits and pt has told therapist that he does not like the idea of wearing multiple socks so is not compliant. Reviewed proper sock management w/pt, including need to carry socks with him at all times and adding socks during the day as his leg size fluctuates. Pt adamant that this is the first time he has been educated on this and has very poor retention and recall into education provided in PT thus far. Pt informed that prosthetic training takes time and he will need to take one step at a time to ensure he is safe and strong enough to be able to walk without AD in community. Encouraged contacting counselor again to work on coping mechanisms and accepting his new BKA, as pt reports he was not told what to expect w/his BKA and cannot even look at my leg.  Pt in agreement to add one more PT appointment in 2 weeks to see how pt is doing w/his homework of wearing socks, performing proper transfers and using AD at all times. Will determine if pt is appropriate to recert at that time.    PATIENT EDUCATION: Education details: See above.  Person educated: Patient Education method: Explanation, Demonstration, Tactile cues, and Verbal cues Education comprehension: returned demonstration, verbal  cues required, tactile cues required, and needs further education  HOME EXERCISE PROGRAM: Access Code: [email or web address] URL: [email or web address] Date: 01/19/2024 Prepared by: Marlon Graciano Batson  Exercises - Diagonal Hip Extension with Resistance  - 1 x daily - 7 x weekly - 3 sets - 10 reps - Bird Dog  - 1 x daily - 7 x weekly - 3 sets - 10 reps - Side Stepping with Resistance at Thighs and Counter Support  - 1 x daily - 7 x weekly - 3 sets - 10 reps -Step taps at sink - Supine 90/90 Alternating Heel Touches with Posterior Pelvic Tilt  - 1 x daily - 7 x weekly - 3 sets - 10 reps -Wall bumps all directions x15 each direction once a day - Supine Dead Bug with Leg Extension  - 1 x daily - 7 x weekly - 1-2 sets - 10 reps  ASSESSMENT:  CLINICAL IMPRESSION: Emphasis of skilled PT session on pt education on importance of safety at home, reiteration on sock management, review of squat pivot transfers and PT POC. Pt reports he has had 2 falls since last visit (2 days ago), both of which occurred at home. Pt continues to perform squat pivot transfers from his bed to unlocked WC at home improperly despite repeated education on proper technique. Pt also not using AD at home despite recommendation from therapists. Pt demonstrates very poor insight into his deficits and absent retention of education provided by therapists. Pt continues to state he has not received education on sock management despite being educated on this since evaluation. Pt will benefit from multidisciplinary approach to rehab and has been encouraged to follow-up w/psychiatrist, prosthetist and counselor  on multiple occasions but has not done so. Due to medical instability, reluctance to follow safety recommendations and lack of acceptance of rehab requirements, pt is being put on pause from PT to conserve PT visits for 2 weeks and will determine if pt is safe to recert at next visit. Pt encouraged to work on proper transfers at home,  sock management and use of AD at all times while on pause to improve independence w/prosthetic use, skin integrity and reduced frequency of falling. Continue POC.   OBJECTIVE IMPAIRMENTS: Abnormal gait, decreased activity tolerance, decreased balance, decreased endurance, decreased mobility, difficulty walking, decreased strength, impaired sensation, improper body mechanics, prosthetic dependency , and pain  ACTIVITY LIMITATIONS: carrying, lifting, bending, squatting, stairs, transfers, bed mobility, bathing, dressing, reach over head, hygiene/grooming, and locomotion level  PARTICIPATION LIMITATIONS: shopping, community activity, occupation, and yard work  PERSONAL FACTORS: Past/current experiences and 1-2 comorbidities: CMT and L BKA are also affecting patient's functional outcome.   REHAB POTENTIAL: Excellent  CLINICAL DECISION MAKING: Evolving/moderate complexity  EVALUATION COMPLEXITY: Moderate   GOALS: Goals reviewed with patient? Yes  SHORT TERM GOALS: Target date: 02/11/2024   Pt will be independent with initial HEP for improved strength, balance, transfers and gait. Baseline: not established on eval  Goal status: INITIAL  2.  Pt will demonstrate a gait speed of >/=2.22 feet/sec in order to decrease risk for falls. Baseline: 2.02 ft/sec w/ 2WW Goal status: INITIAL  3.  Pt will perform floor transfer w/CGA w/L prosthetic donned for improved fall recovery and safety at home  Baseline:  Goal status: INITIAL  4.  to be assessed and LTG updated  Baseline:  Goal status: INITIAL   LONG TERM GOALS: Target date: 02/25/2024    Pt will be independent with final HEP for improved strength, balance, transfers and gait.  Baseline:  Goal status: INITIAL  2.  Pt will demonstrate a gait speed of >/=2.42 feet/sec in order to decrease risk for falls. Baseline: 2.02 ft/sec w/ 2WW Goal status: INITIAL  3.  goal  Baseline:  Goal status: INITIAL  4.  Pt will perform  floor transfer mod I  w/L prosthetic donned for improved fall recovery and safety at home  Baseline:  Goal status: INITIAL   PLAN:  PT FREQUENCY: 2x/week  PT DURATION: 6 weeks  PLANNED INTERVENTIONS: 97164- PT Re-evaluation, 97750- Physical Performance Testing, 97110-Therapeutic exercises, 97530- Therapeutic activity, [id number]- Neuromuscular re-education, 97535- Self Care, [id number]- Manual therapy, [id number]- Gait training, [phone]- Orthotic/Prosthetic subsequent, [id number]- Aquatic Therapy, [phone]- Electrical stimulation (manual), [phone] (1-2 muscles), [id number] (3+ muscles)- Dry Needling, Patient/Family education, Balance training, Stair training, Joint mobilization, Spinal mobilization, Vestibular training, and DME instructions  PLAN FOR NEXT SESSION:  Add to high level HEP for weightbearing tolerance on LLE, glute strength and hip extension to assist w/gait. Stairs, walking w/reduced support on AD, walking on unlevel surfaces, staggered sit to stands, RDLs, core stability.  Check skin!    03/10/2024:  ASSESS LTGs - Re-cert?     Check all possible CPT codes: See Planned Interventions List for Planned CPT Codes    Check all conditions that are expected to impact treatment: Structural or anatomic abnormalities, Neurological condition and/or seizures, and Psychological or psychiatric disorders   If treatment provided at initial evaluation, no treatment charged due to lack of authorization.     Aleaha Fickling E Jonluke Cobbins, PT, DPT 02/25/2024, 2:13 PM

## 2024-03-09 ENCOUNTER — Ambulatory Visit: Payer: Self-pay | Attending: Orthopedic Surgery | Admitting: Physical Therapy

## 2024-03-09 DIAGNOSIS — M6281 Muscle weakness (generalized): Secondary | ICD-10-CM | POA: Diagnosis present

## 2024-03-09 DIAGNOSIS — G547 Phantom limb syndrome without pain: Secondary | ICD-10-CM | POA: Diagnosis present

## 2024-03-09 DIAGNOSIS — R2689 Other abnormalities of gait and mobility: Secondary | ICD-10-CM | POA: Insufficient documentation

## 2024-03-09 DIAGNOSIS — R2681 Unsteadiness on feet: Secondary | ICD-10-CM | POA: Insufficient documentation

## 2024-03-09 DIAGNOSIS — R296 Repeated falls: Secondary | ICD-10-CM | POA: Diagnosis present

## 2024-03-09 DIAGNOSIS — Z9181 History of falling: Secondary | ICD-10-CM | POA: Diagnosis present

## 2024-03-09 NOTE — Therapy (Signed)
 OUTPATIENT PHYSICAL THERAPY PROSTHETICS TREATMENT - RECERTIFICATION   Patient Name: [name] MRN: [id number] DOB:10-30-1965, 58 y.o., male Today's Date: 03/09/2024  PCP: Fritzi Nest, MD REFERRING PROVIDER: Harden Jerona GAILS, MD  END OF SESSION:  PT End of Session - 03/09/24 1405     Visit Number 9    Number of Visits 28   Plus eval   Date for Recertification  05/18/24   Recert   Authorization Type [employer]    PT Start Time 1402    PT Stop Time 1449    PT Time Calculation (min) 47 min    Equipment Utilized During Treatment Other (comment);Gait belt   L BKA prosthetic   Activity Tolerance Patient tolerated treatment well    Behavior During Therapy WFL for tasks assessed/performed           Past Medical History:  Diagnosis Date   ADHD (attention deficit hyperactivity disorder)    Anxiety and depression    Candida esophagitis (HCC)    in setting of DKA/hyperglycemia   CIDP (chronic inflammatory demyelinating polyneuropathy) (HCC)    Depression    Diabetes type 2, controlled (HCC)    Diverticulosis    Essential hypertension 06/21/2014   Hyperlipidemia    Kidney stones    Pancreatitis    Stomach ulcer    Vitamin D deficiency    Past Surgical History:  Procedure Laterality Date   AMPUTATION Left 10/16/2023   Procedure: LEFT BELOW KNEE AMPUTATION;  Surgeon: Harden Jerona GAILS, MD;  Location: [facility] OR;  Service: Orthopedics;  Laterality: Left;  LEFT BELOW KNEE AMPUTATION   ANKLE SURGERY     APPLICATION OF WOUND VAC Left 10/16/2023   Procedure: APPLICATION, WOUND VAC;  Surgeon: Harden Jerona GAILS, MD;  Location: [facility] OR;  Service: Orthopedics;  Laterality: Left;   CERVICAL DISC SURGERY     C4-C5   ELBOW SURGERY     ESOPHAGOGASTRODUODENOSCOPY N/A 07/16/2017   Procedure: ESOPHAGOGASTRODUODENOSCOPY (EGD);  Surgeon: Avram Lupita BRAVO, MD;  Location: [facility] ENDOSCOPY;  Service: Endoscopy;  Laterality: N/A;   IRRIGATION AND DEBRIDEMENT FOOT Left 07/31/2023   Procedure:  IRRIGATION AND DEBRIDEMENT FOOT AND FIFTH MET RESCTION;  Surgeon: Malvin Marsa FALCON, DPM;  Location: [facility] OR;  Service: Orthopedics/Podiatry;  Laterality: Left;   RETINAL DETACHMENT SURGERY     RHINOPLASTY     VASECTOMY     WOUND DEBRIDEMENT Left 03/27/2018   Procedure: DEBRIDEMENT WOUND;  Surgeon: Gershon Donnice SAUNDERS, DPM;  Location: [facility] OR;  Service: Podiatry;  Laterality: Left;   Patient Active Problem List   Diagnosis Date Noted   Bipolar II disorder (HCC) 10/23/2023   Malnutrition of moderate degree 10/22/2023   Osteomyelitis of left foot (HCC) 10/21/2023   S/P BKA (below knee amputation) unilateral, left (HCC) 10/16/2023   Thrombophlebitis arm 08/03/2023   Hypokalemia 07/30/2023   Subacute osteomyelitis, left ankle and foot (HCC) 07/29/2023   Foot ulcer with fat layer exposed, left (HCC) 11/15/2019   Displaced fracture of shaft of left clavicle, initial encounter for closed fracture 03/15/2019   Dupuytren's contracture 07/29/2018   PICC (peripherally inserted central catheter) in place 05/24/2018   Neuropathy 05/12/2018   Osteomyelitis of ankle or foot, acute, left (HCC) 05/05/2018   Diabetic ulcer of left midfoot associated with type 2 diabetes mellitus, limited to breakdown of skin (HCC) 12/18/2017   Chronic pain syndrome 11/03/2017   Gastroesophageal reflux disease with esophagitis without hemorrhage 09/17/2017   Esophageal dysphagia    DKA, type 2 (HCC)  07/15/2017   ADD (attention deficit disorder) 07/15/2017   DKA (diabetic ketoacidosis) (HCC) 07/15/2017   Vitamin B12 deficiency (non anemic) 08/13/2016   Adhesive capsulitis of left shoulder 01/18/2016   Trigger ring finger of right hand 10/11/2015   Primary osteoarthritis of first carpometacarpal joint of left hand 06/14/2015   Radial styloid tenosynovitis 06/14/2015   Depression 10/05/2014   Noncompliance with diabetes treatment 10/05/2014   Vitamin D deficiency 09/14/2014   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   Diabetic peripheral neuropathy associated with type 2 diabetes mellitus (HCC) 06/21/2014   Essential hypertension 06/21/2014   Erectile dysfunction associated with type 2 diabetes mellitus (HCC) 06/21/2014   Long-term current use of insulin  for diabetes mellitus (HCC) 06/21/2014   Adhesive capsulitis of right shoulder 03/24/2011   Retinal detachment 12/06/2010   Rotator cuff syndrome 12/06/2010   History of pancreatitis 10/22/2010   Hypertriglyceridemia 10/22/2010   Diabetes mellitus type II, uncontrolled 10/22/2010    ONSET DATE: 01/06/2024 (referral)   REFERRING DIAG: S10.487 (ICD-10-CM) - S/P BKA (below knee amputation) unilateral, left (HCC)  THERAPY DIAG:  Other abnormalities of gait and mobility  Muscle weakness (generalized)  Phantom limb syndrome without pain (HCC)  History of falling  Unsteadiness on feet  Rationale for Evaluation and Treatment: Rehabilitation  SUBJECTIVE:   SUBJECTIVE STATEMENT: Pt presents w/RW and L BKA socket w/3ply sock. 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 Has been working w/psychiatrist to manage medications and is sleeping better now. Denies falls. Has been working on walking and his HEP at home. Broke his bed frame and needs to get a new one, which has made bed mobility difficult, but is performing squat pivots properly following last session and has not had any issues.   Pt accompanied by: self  PERTINENT HISTORY: T2DM, CIDP, 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 Pierce), IV antibiotics and hyperbaric tx ([facility]), status post below-knee amputation on the left from May 16  PAIN:  Are you having pain? No  PRECAUTIONS: Fall  RED FLAGS: None   WEIGHT BEARING RESTRICTIONS: No  FALLS: Has patient fallen in last 6 months? Yes. Number of falls a couple  LIVING ENVIRONMENT: Lives with: lives alone Lives  in: House/apartment Home Access: Ramped entrance Home layout: One level Stairs: No Has following equipment at home: Environmental consultant - 2 wheeled, Wheelchair (manual), and Tour manager  PLOF: Independent and Leisure: Tennis and biking  PATIENT GOALS:  I wanna be able to run. I want to go above and beyond what I was doing before.  I would like to walk into the football game with either nothing or a cane (First weekend in September)   OBJECTIVE:  Note: Objective measures were completed at Evaluation unless otherwise noted.  DIAGNOSTIC FINDINGS: None relevant on file   COGNITION: Overall cognitive status: Within functional limits for tasks assessed   SENSATION: Pt reports frequent phantom pain in LLE, most recently sensation is that his L ankle is sore, as if he has rolled his ankle   POSTURE: rounded shoulders  LOWER EXTREMITY ROM:  Active ROM Right eval Left eval  Hip flexion    Hip extension    Hip abduction    Hip adduction    Hip internal rotation    Hip external rotation    Knee flexion    Knee extension    Ankle dorsiflexion    Ankle plantarflexion  Ankle inversion    Ankle eversion     (Blank rows = not tested)  LOWER EXTREMITY MMT:  MMT Right eval Left eval  Hip flexion    Hip extension    Hip abduction    Hip adduction    Hip internal rotation    Hip external rotation    Knee flexion    Knee extension    Ankle dorsiflexion    Ankle plantarflexion    Ankle inversion    Ankle eversion    (Blank rows = not tested)  BED MOBILITY:  Pt reports independence with this   TRANSFERS: Sit to stand: Modified independence Stand to sit: Modified independence Pt demonstrates significant weight shift to R side w/posterior instability, but is able to perform without AD   RAMP: Not assessed on eval   CURB: Not assessed on eval   GAIT:  Gait pattern: step through pattern and lateral hip instability Distance walked: Various short clinic distances Assistive  device utilized: Environmental consultant - 2 wheeled Level of assistance: SBA Comments: Pt entered and exited clinic w/RW, occasional scuffing of R foot noted     FUNCTIONAL TESTS:   [facility] PT Assessment - 03/09/24 1413       Ambulation/Gait   Gait velocity 32.8' over 17.38s = 1.88 ft/s w/RW   SBA           CURRENT PROSTHETIC WEAR ASSESSMENT: Patient is independent with: skin check, residual limb care, care of non-amputated limb, prosthetic cleaning, ply sock cleaning, and correct ply sock adjustment Patient is dependent with: proper wear schedule/adjustment and proper weight-bearing schedule/adjustment Donning prosthesis: Complete Independence Doffing prosthesis: Complete Independence Prosthetic wear tolerance: 8-10 hours/day, 7 days/week Prosthetic weight bearing tolerance: >45 minutes Residual limb condition: Intact residual limb w/well-healed incision, no adhesions noted w/good compliance to wearing shrinker. Pt w/long residual limb Prosthetic description: PTB BKA prosthetic w/Pin lock  K code/activity level with prosthetic use: Level 3                                                                                                                TREATMENT:  Ther Act/Physical Performance   Added 1 ply sock to leg as pt reported feeling shorter on L side. After adding sock, pt reported increased stability and comfort. Reiterated education regarding importance of sock management and pt reports he has been working on this. Pt had several questions regarding fluctuations in leg size and when to expect his leg size to stabilize, so informed pt it may take about 6 months of consistent prosthetic wear for leg to begin to stabilize in size, but this is greatly dependent on a variety of factors and is different for each pt. Pt verbalized understanding.    [facility] PT Assessment - 03/09/24 1413       Ambulation/Gait   Gait velocity 32.8' over 17.38s = 1.88 ft/s w/RW   SBA          Gait pattern:  step through pattern, decreased stance time- Left, decreased hip/knee flexion- Right,  lateral lean- Left, trunk flexed, and poor foot clearance- Right Distance walked: 115' loop completed 4x + 95' = 555' Assistive device utilized: Environmental consultant - 2 wheeled Level of assistance: SBA Comments: Pt demonstrated frequent downward gaze w/activity and occasional scuffing R foot. Compensated trendelenburg of L hip that worsened w/fatigue   Pt inquired about returning to gym and which exercises he can performed. Recommended seated LE exercises, such as leg presses and hip abd/add. Discouraged free weights at this time unless pt is seated. Pt verbalized understanding  Pt inquiring about amputee support group, so will email Grayce Spatz today to provide pt's information to support group.  Discussed goals for ongoing POC and pt reports he would like to work on walking w/reduced UE support, retro gait and functional hip strengthening. Goals updated to reflect pt's personal goals.    PATIENT EDUCATION: Education details: Continued education on sock management, goal results, see above for gym recommendations   Person educated: Patient Education method: Explanation, Demonstration, Tactile cues, and Verbal cues Education comprehension: returned demonstration, verbal cues required, tactile cues required, and needs further education  HOME EXERCISE PROGRAM: Access Code: [email or web address] URL: [email or web address] Date: 01/19/2024 Prepared by: Marlon Leonie Amacher  Exercises - Diagonal Hip Extension with Resistance  - 1 x daily - 7 x weekly - 3 sets - 10 reps - Bird Dog  - 1 x daily - 7 x weekly - 3 sets - 10 reps - Side Stepping with Resistance at Thighs and Counter Support  - 1 x daily - 7 x weekly - 3 sets - 10 reps -Step taps at sink - Supine 90/90 Alternating Heel Touches with Posterior Pelvic Tilt  - 1 x daily - 7 x weekly - 3 sets - 10 reps -Wall bumps all directions x15 each direction once a day - Supine  Dead Bug with Leg Extension  - 1 x daily - 7 x weekly - 1-2 sets - 10 reps  ASSESSMENT:  CLINICAL IMPRESSION: Emphasis of skilled PT session on LTG assessment, pt education on sock management and writing goals for recertification. Pt has not met any STG or LTG this date as his progress in PT has been greatly limited by lack of attendance due to pain and poor medication management. However, pt reports he has been a good student and is performing proper squat pivot transfers and has not had a fall since last PT visit. Pt continues to require min-mod cues for proper sock management and has several questions regarding need to wear socks, which were addressed during session. Pt has goal of walking without AD and returning to running, so goals updated as appropriate. Recommended pt join amputee support group as he would benefit from mentorship for his mental and physical health. Continue POC.   OBJECTIVE IMPAIRMENTS: Abnormal gait, decreased activity tolerance, decreased balance, decreased endurance, decreased mobility, difficulty walking, decreased strength, impaired sensation, improper body mechanics, prosthetic dependency , and pain  ACTIVITY LIMITATIONS: carrying, lifting, bending, squatting, stairs, transfers, bed mobility, bathing, dressing, reach over head, hygiene/grooming, and locomotion level  PARTICIPATION LIMITATIONS: shopping, community activity, occupation, and yard work  PERSONAL FACTORS: Past/current experiences and 1-2 comorbidities: CMT and L BKA are also affecting patient's functional outcome.   REHAB POTENTIAL: Excellent  CLINICAL DECISION MAKING: Evolving/moderate complexity  EVALUATION COMPLEXITY: Moderate   GOALS: Goals reviewed with patient? Yes  SHORT TERM GOALS: Target date: 02/11/2024   Pt will be independent with initial HEP for improved strength, balance, transfers and gait. Baseline: not established on eval  Goal  status: MET   2.  Pt will demonstrate a gait  speed of >/=2.22 feet/sec in order to decrease risk for falls. Baseline: 2.02 ft/sec w/ 2WW; 1.89 ft/s w/RW (10/8) Goal status: NOT MET  3.  Pt will perform floor transfer w/CGA w/L prosthetic donned for improved fall recovery and safety at home  Baseline:  Goal status: NOT ASSESSED   4.  to be assessed and LTG updated  Baseline:  Goal status: DC   LONG TERM GOALS: Target date: 02/25/2024    Pt will be independent with final HEP for improved strength, balance, transfers and gait.  Baseline:  Goal status: IN PROGRESS   2.  Pt will demonstrate a gait speed of >/=2.42 feet/sec in order to decrease risk for falls. Baseline: 2.02 ft/sec w/ 2WW; 1.89 ft/s w/RW (10/8) Goal status: NOT MET   3.  goal  Baseline:  Goal status: DC  4.  Pt will perform floor transfer mod I  w/L prosthetic donned for improved fall recovery and safety at home  Baseline:  Goal status: NOT ASSESSED    NEW SHORT TERM GOALS:   Target date: 04/06/2024  Pt will ambulate greater than or equal to 700 feet on with LRAD mod I for improved cardiovascular endurance and BLE strength.   Baseline: 555' w/RW and SBA Goal status: INITIAL  2.  Pt will improve gait velocity to at least 2.2 ft/s w/LRAD for improved gait efficiency and independence  Baseline: 1.88 ft/s w/RW  Goal status: INITIAL  3.   Pt will perform floor transfer w/CGA w/L prosthetic donned for improved fall recovery and safety at home  Baseline:  Goal status: INITIAL   NEW LONG TERM GOALS:  Target date: 05/04/2024  Pt will ambulate greater than or equal to 850 feet on with LRAD mod I for improved cardiovascular endurance and BLE strength.   Baseline: 555' w/RW and SBA Goal status: INITIAL  2.  Pt will improve gait velocity to at least 2.6 ft/s w/LRAD for improved gait efficiency and independence  Baseline: 1.88 ft/s w/RW Goal status: INITIAL  3.  Pt will perform floor transfer mod I  w/L prosthetic donned for improved  fall recovery and safety at home  Baseline:  Goal status: INITIAL  4.  Pt will be independent with final HEP and gym program for improved strength, balance, transfers and gait. Baseline:  Goal status: IN PROGRESS     PLAN:  PT FREQUENCY: 2x/week  PT DURATION: 6 weeks + 10 weeks (recert)  PLANNED INTERVENTIONS: 02835- PT Re-evaluation, 97750- Physical Performance Testing, 97110-Therapeutic exercises, 97530- Therapeutic activity, [id number]- Neuromuscular re-education, 97535- Self Care, 02859- Manual therapy, [id number]- Gait training, [phone]- Orthotic/Prosthetic subsequent, [phone]- Aquatic Therapy, [phone]- Electrical stimulation (manual), [phone] (1-2 muscles), [phone] (3+ muscles)- Dry Needling, Patient/Family education, Balance training, Stair training, Joint mobilization, Spinal mobilization, Vestibular training, and DME instructions  PLAN FOR NEXT SESSION: 10th visit PN.  Glute strength of LLE. Walking with cane. Things he can do at the gym. Add to high level HEP for weightbearing tolerance on LLE, glute strength and hip extension to assist w/gait. Stairs, walking w/reduced support on AD, walking on unlevel surfaces, staggered sit to stands, RDLs, core stability.  Check skin!      Check all possible CPT codes: See Planned Interventions List for Planned CPT Codes    Check all conditions that are expected to impact treatment: Structural or anatomic abnormalities, Neurological condition and/or seizures, and Psychological or psychiatric disorders   If  treatment provided at initial evaluation, no treatment charged due to lack of authorization.     Ajanay Farve E Bridget Wrangell, PT, DPT 03/09/2024, 2:52 PM

## 2024-03-15 ENCOUNTER — Ambulatory Visit: Admitting: Physical Therapy

## 2024-03-15 DIAGNOSIS — R2689 Other abnormalities of gait and mobility: Secondary | ICD-10-CM

## 2024-03-15 DIAGNOSIS — G547 Phantom limb syndrome without pain: Secondary | ICD-10-CM

## 2024-03-15 DIAGNOSIS — R296 Repeated falls: Secondary | ICD-10-CM

## 2024-03-15 DIAGNOSIS — M6281 Muscle weakness (generalized): Secondary | ICD-10-CM

## 2024-03-15 NOTE — Therapy (Signed)
 OUTPATIENT PHYSICAL THERAPY PROSTHETICS TREATMENT - 10TH VISIT PROGRESS NOTE   Patient Name: [name] MRN: [id number] DOB:02/16/1966, 58 y.o., male Today's Date: 03/15/2024  PCP: Fritzi Nest, MD REFERRING PROVIDER: Harden Jerona GAILS, MD  Physical Therapy Progress Note   Dates of Reporting Period:01/14/24 - 03/15/24  See Note below for Objective Data and Assessment of Progress/Goals.    END OF SESSION:  PT End of Session - 03/15/24 1454     Visit Number 10    Number of Visits 28   Plus eval   Date for Recertification  05/18/24   Recert   Authorization Type [employer]    PT Start Time 1451    PT Stop Time 1532    PT Time Calculation (min) 41 min    Equipment Utilized During Treatment Other (comment);Gait belt   L BKA prosthetic   Activity Tolerance Patient tolerated treatment well    Behavior During Therapy WFL for tasks assessed/performed           Past Medical History:  Diagnosis Date   ADHD (attention deficit hyperactivity disorder)    Anxiety and depression    Candida esophagitis (HCC)    in setting of DKA/hyperglycemia   CIDP (chronic inflammatory demyelinating polyneuropathy) (HCC)    Depression    Diabetes type 2, controlled (HCC)    Diverticulosis    Essential hypertension 06/21/2014   Hyperlipidemia    Kidney stones    Pancreatitis    Stomach ulcer    Vitamin D deficiency    Past Surgical History:  Procedure Laterality Date   AMPUTATION Left 10/16/2023   Procedure: LEFT BELOW KNEE AMPUTATION;  Surgeon: Harden Jerona GAILS, MD;  Location: [facility] OR;  Service: Orthopedics;  Laterality: Left;  LEFT BELOW KNEE AMPUTATION   ANKLE SURGERY     APPLICATION OF WOUND VAC Left 10/16/2023   Procedure: APPLICATION, WOUND VAC;  Surgeon: Harden Jerona GAILS, MD;  Location: [facility] OR;  Service: Orthopedics;  Laterality: Left;   CERVICAL DISC SURGERY     C4-C5   ELBOW SURGERY     ESOPHAGOGASTRODUODENOSCOPY N/A 07/16/2017   Procedure: ESOPHAGOGASTRODUODENOSCOPY  (EGD);  Surgeon: Avram Lupita BRAVO, MD;  Location: [facility] ENDOSCOPY;  Service: Endoscopy;  Laterality: N/A;   IRRIGATION AND DEBRIDEMENT FOOT Left 07/31/2023   Procedure: IRRIGATION AND DEBRIDEMENT FOOT AND FIFTH MET RESCTION;  Surgeon: Malvin Marsa FALCON, DPM;  Location: [facility] OR;  Service: Orthopedics/Podiatry;  Laterality: Left;   RETINAL DETACHMENT SURGERY     RHINOPLASTY     VASECTOMY     WOUND DEBRIDEMENT Left 03/27/2018   Procedure: DEBRIDEMENT WOUND;  Surgeon: Gershon Donnice SAUNDERS, DPM;  Location: [facility] OR;  Service: Podiatry;  Laterality: Left;   Patient Active Problem List   Diagnosis Date Noted   Bipolar II disorder (HCC) 10/23/2023   Malnutrition of moderate degree 10/22/2023   Osteomyelitis of left foot (HCC) 10/21/2023   S/P BKA (below knee amputation) unilateral, left (HCC) 10/16/2023   Thrombophlebitis arm 08/03/2023   Hypokalemia 07/30/2023   Subacute osteomyelitis, left ankle and foot (HCC) 07/29/2023   Foot ulcer with fat layer exposed, left (HCC) 11/15/2019   Displaced fracture of shaft of left clavicle, initial encounter for closed fracture 03/15/2019   Dupuytren's contracture 07/29/2018   PICC (peripherally inserted central catheter) in place 05/24/2018   Neuropathy 05/12/2018   Osteomyelitis of ankle or foot, acute, left (HCC) 05/05/2018   Diabetic ulcer of left midfoot associated with type 2 diabetes mellitus, limited to breakdown of skin (  HCC) 12/18/2017   Chronic pain syndrome 11/03/2017   Gastroesophageal reflux disease with esophagitis without hemorrhage 09/17/2017   Esophageal dysphagia    DKA, type 2 (HCC) 07/15/2017   ADD (attention deficit disorder) 07/15/2017   DKA (diabetic ketoacidosis) (HCC) 07/15/2017   Vitamin B12 deficiency (non anemic) 08/13/2016   Adhesive capsulitis of left shoulder 01/18/2016   Trigger ring finger of right hand 10/11/2015   Primary osteoarthritis of first carpometacarpal joint of left hand 06/14/2015   Radial styloid tenosynovitis  06/14/2015   Depression 10/05/2014   Noncompliance with diabetes treatment 10/05/2014   Vitamin D deficiency 09/14/2014   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   Diabetic peripheral neuropathy associated with type 2 diabetes mellitus (HCC) 06/21/2014   Essential hypertension 06/21/2014   Erectile dysfunction associated with type 2 diabetes mellitus (HCC) 06/21/2014   Long-term current use of insulin  for diabetes mellitus (HCC) 06/21/2014   Adhesive capsulitis of right shoulder 03/24/2011   Retinal detachment 12/06/2010   Rotator cuff syndrome 12/06/2010   History of pancreatitis 10/22/2010   Hypertriglyceridemia 10/22/2010   Diabetes mellitus type II, uncontrolled 10/22/2010    ONSET DATE: 01/06/2024 (referral)   REFERRING DIAG: S10.487 (ICD-10-CM) - S/P BKA (below knee amputation) unilateral, left (HCC)  THERAPY DIAG:  Other abnormalities of gait and mobility  Muscle weakness (generalized)  Repeated falls  Phantom limb syndrome without pain (HCC)  Rationale for Evaluation and Treatment: Rehabilitation  SUBJECTIVE:   SUBJECTIVE STATEMENT: Pt presents w/RW and L BKA socket, no socks on today. States he tried to put on 3ply first thing this AM, was too thick, so did not put any on. Had one fall since last session, landed on his bed incorrectly and was bounced off onto the floor. Hit his forehead, is bleeding upon arrival, but denies other injuries.   Pt accompanied by: self  PERTINENT HISTORY: T2DM, CIDP, 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 Pierce), IV antibiotics and hyperbaric tx ([facility]), status post below-knee amputation on the left from May 16  PAIN:  Are you having pain? No  PRECAUTIONS: Fall  RED FLAGS: None   WEIGHT BEARING RESTRICTIONS: No  FALLS: Has patient fallen in last 6 months? Yes. Number of falls a couple  LIVING  ENVIRONMENT: Lives with: lives alone Lives in: House/apartment Home Access: Ramped entrance Home layout: One level Stairs: No Has following equipment at home: Environmental consultant - 2 wheeled, Wheelchair (manual), and Tour manager  PLOF: Independent and Leisure: Tennis and biking  PATIENT GOALS:  I wanna be able to run. I want to go above and beyond what I was doing before.  I would like to walk into the football game with either nothing or a cane (First weekend in September)   OBJECTIVE:  Note: Objective measures were completed at Evaluation unless otherwise noted.  DIAGNOSTIC FINDINGS: None relevant on file   COGNITION: Overall cognitive status: Within functional limits for tasks assessed   SENSATION: Pt reports frequent phantom pain in LLE, most recently sensation is that his L ankle is sore, as if he has rolled his ankle   POSTURE: rounded shoulders  LOWER EXTREMITY ROM:  Active ROM Right eval Left eval  Hip flexion    Hip extension    Hip abduction    Hip adduction    Hip internal rotation    Hip external rotation    Knee flexion    Knee  extension    Ankle dorsiflexion    Ankle plantarflexion    Ankle inversion    Ankle eversion     (Blank rows = not tested)  LOWER EXTREMITY MMT:  MMT Right eval Left eval  Hip flexion    Hip extension    Hip abduction    Hip adduction    Hip internal rotation    Hip external rotation    Knee flexion    Knee extension    Ankle dorsiflexion    Ankle plantarflexion    Ankle inversion    Ankle eversion    (Blank rows = not tested)  BED MOBILITY:  Pt reports independence with this   TRANSFERS: Sit to stand: Modified independence Stand to sit: Modified independence Pt demonstrates significant weight shift to R side w/posterior instability, but is able to perform without AD   RAMP: Not assessed on eval   CURB: Not assessed on eval   GAIT:  Gait pattern: step through pattern and lateral hip instability Distance walked:  Various short clinic distances Assistive device utilized: Environmental consultant - 2 wheeled Level of assistance: SBA Comments: Pt entered and exited clinic w/RW, occasional scuffing of R foot noted     FUNCTIONAL TESTS:       CURRENT PROSTHETIC WEAR ASSESSMENT: Patient is independent with: skin check, residual limb care, care of non-amputated limb, prosthetic cleaning, ply sock cleaning, and correct ply sock adjustment Patient is dependent with: proper wear schedule/adjustment and proper weight-bearing schedule/adjustment Donning prosthesis: Complete Independence Doffing prosthesis: Complete Independence Prosthetic wear tolerance: 8-10 hours/day, 7 days/week Prosthetic weight bearing tolerance: >45 minutes Residual limb condition: Intact residual limb w/well-healed incision, no adhesions noted w/good compliance to wearing shrinker. Pt w/long residual limb Prosthetic description: PTB BKA prosthetic w/Pin lock  K code/activity level with prosthetic use: Level 3                                                                                                                TREATMENT:  Ther Act/NMR   While turning to sit in chair, pt parked RW to his R side and pivoted on RLE outside of RW to land harshly in chair. Reviewed proper squat pivot tecnique w/emphasis on fully turning to sit and improved eccentric control while sitting. Had pt demonstrate proper turn to sit and pt able to perform well and acknowledged feeling more controlled w/this technique.  Added 2 ply socks to LLE and reviewed sock management as pt continues to be confused over this. Reviewed ways to assess need for socks (socket too loose on leg at patellar notch, foot rotating w/gait, bottoming out in socket) and pt able to teach these back. Recommended pt set a timer for every 2 hours at home to remind him to assess need for more socks to make this a habit. Pt verbalized understanding.  SciFit multi-peaks level 2.0 for 8 minutes using BLEs  only for neural priming for reciprocal movement, dynamic cardiovascular conditioning and increased amplitude of stepping. Informed pt this is something he can  do at the gym and recommend pt start with a seated exercise bike prior to strength training to reduce risk of injury.  In // bars, pt performed fwd and lateral step ups to 4 step starting w/BUE support and progressing to RUE support only, x15 reps per direction leading w/LLE. Min cues to avoid locking out L knee during activity and to reduce reliance on BUE support to perform. Pt very challenged by task, especially in lateral direction, but no LOB noted.    PATIENT EDUCATION: Education details: Continued education on Merchandiser, retail, continue HEP  Person educated: Patient Education method: Explanation, Demonstration, Tactile cues, and Verbal cues Education comprehension: returned demonstration, verbal cues required, tactile cues required, and needs further education  HOME EXERCISE PROGRAM: Access Code: [email or web address] URL: [email or web address] Date: 01/19/2024 Prepared by: Marlon Aveer Bartow  Exercises - Diagonal Hip Extension with Resistance  - 1 x daily - 7 x weekly - 3 sets - 10 reps - Bird Dog  - 1 x daily - 7 x weekly - 3 sets - 10 reps - Side Stepping with Resistance at Thighs and Counter Support  - 1 x daily - 7 x weekly - 3 sets - 10 reps -Step taps at sink - Supine 90/90 Alternating Heel Touches with Posterior Pelvic Tilt  - 1 x daily - 7 x weekly - 3 sets - 10 reps -Wall bumps all directions x15 each direction once a day - Supine Dead Bug with Leg Extension  - 1 x daily - 7 x weekly - 1-2 sets - 10 reps  ASSESSMENT:  CLINICAL IMPRESSION: Emphasis of skilled PT session on continued education regarding sock management for LLE, proper transfers and functional BLE strength. Pt has had one fall since last session, hitting his head on the floor and presenting w/open wound on forehead today. Pt reports this happened last  night and he is not actively bleeding, declining wound care by therapist. Pt continues to perform transfers w/poor eccentric control and improper AD management, requiring repeated education each session on proper transfer technique. Pt also continues to require max education on sock management, but was able to teach back ways to know he needs to add a sock and will assess retention at session tomorrow. Pt reports compliance w/use of RW at all times and see previous treatment note for update on goals. Continue POC.    OBJECTIVE IMPAIRMENTS: Abnormal gait, decreased activity tolerance, decreased balance, decreased endurance, decreased mobility, difficulty walking, decreased strength, impaired sensation, improper body mechanics, prosthetic dependency , and pain  ACTIVITY LIMITATIONS: carrying, lifting, bending, squatting, stairs, transfers, bed mobility, bathing, dressing, reach over head, hygiene/grooming, and locomotion level  PARTICIPATION LIMITATIONS: shopping, community activity, occupation, and yard work  PERSONAL FACTORS: Past/current experiences and 1-2 comorbidities: CMT and L BKA are also affecting patient's functional outcome.   REHAB POTENTIAL: Excellent  CLINICAL DECISION MAKING: Evolving/moderate complexity  EVALUATION COMPLEXITY: Moderate   GOALS: Goals reviewed with patient? Yes  SHORT TERM GOALS: Target date: 02/11/2024   Pt will be independent with initial HEP for improved strength, balance, transfers and gait. Baseline: not established on eval  Goal status: MET   2.  Pt will demonstrate a gait speed of >/=2.22 feet/sec in order to decrease risk for falls. Baseline: 2.02 ft/sec w/ 2WW; 1.89 ft/s w/RW (10/8) Goal status: NOT MET  3.  Pt will perform floor transfer w/CGA w/L prosthetic donned for improved fall recovery and safety at home  Baseline:  Goal status: NOT ASSESSED   4.   to be assessed and LTG updated  Baseline:  Goal status: DC   LONG TERM GOALS:  Target date: 02/25/2024    Pt will be independent with final HEP for improved strength, balance, transfers and gait.  Baseline:  Goal status: IN PROGRESS   2.  Pt will demonstrate a gait speed of >/=2.42 feet/sec in order to decrease risk for falls. Baseline: 2.02 ft/sec w/ 2WW; 1.89 ft/s w/RW (10/8) Goal status: NOT MET   3.  goal  Baseline:  Goal status: DC  4.  Pt will perform floor transfer mod I  w/L prosthetic donned for improved fall recovery and safety at home  Baseline:  Goal status: NOT ASSESSED    NEW SHORT TERM GOALS:   Target date: 04/06/2024  Pt will ambulate greater than or equal to 700 feet on with LRAD mod I for improved cardiovascular endurance and BLE strength.   Baseline: 555' w/RW and SBA Goal status: INITIAL  2.  Pt will improve gait velocity to at least 2.2 ft/s w/LRAD for improved gait efficiency and independence  Baseline: 1.88 ft/s w/RW  Goal status: INITIAL  3.   Pt will perform floor transfer w/CGA w/L prosthetic donned for improved fall recovery and safety at home  Baseline:  Goal status: INITIAL   NEW LONG TERM GOALS:  Target date: 05/04/2024  Pt will ambulate greater than or equal to 850 feet on with LRAD mod I for improved cardiovascular endurance and BLE strength.   Baseline: 555' w/RW and SBA Goal status: INITIAL  2.  Pt will improve gait velocity to at least 2.6 ft/s w/LRAD for improved gait efficiency and independence  Baseline: 1.88 ft/s w/RW Goal status: INITIAL  3.  Pt will perform floor transfer mod I  w/L prosthetic donned for improved fall recovery and safety at home  Baseline:  Goal status: INITIAL  4.  Pt will be independent with final HEP and gym program for improved strength, balance, transfers and gait. Baseline:  Goal status: IN PROGRESS     PLAN:  PT FREQUENCY: 2x/week  PT DURATION: 6 weeks + 10 weeks (recert)  PLANNED INTERVENTIONS: 02835- PT Re-evaluation, 97750- Physical Performance  Testing, 97110-Therapeutic exercises, 97530- Therapeutic activity, [id number]- Neuromuscular re-education, 97535- Self Care, 02859- Manual therapy, [id number]- Gait training, [phone]- Orthotic/Prosthetic subsequent, [phone]- Aquatic Therapy, [phone]- Electrical stimulation (manual), [phone] (1-2 muscles), [phone] (3+ muscles)- Dry Needling, Patient/Family education, Balance training, Stair training, Joint mobilization, Spinal mobilization, Vestibular training, and DME instructions  PLAN FOR NEXT SESSION: review sock management.  Glute strength of LLE. Walking with cane. Things he can do at the gym. Add to high level HEP for weightbearing tolerance on LLE, glute strength and hip extension to assist w/gait. Stairs, walking w/reduced support on AD, walking on unlevel surfaces, staggered sit to stands, RDLs, core stability.  Check skin!      Check all possible CPT codes: See Planned Interventions List for Planned CPT Codes    Check all conditions that are expected to impact treatment: Structural or anatomic abnormalities, Neurological condition and/or seizures, and Psychological or psychiatric disorders   If treatment provided at initial evaluation, no treatment charged due to lack of authorization.     Lalonnie Shaffer E Jeniah Kishi, PT, DPT 03/15/2024, 3:38 PM

## 2024-03-16 ENCOUNTER — Ambulatory Visit: Admitting: Physical Therapy

## 2024-03-16 DIAGNOSIS — R296 Repeated falls: Secondary | ICD-10-CM

## 2024-03-16 DIAGNOSIS — R2689 Other abnormalities of gait and mobility: Secondary | ICD-10-CM

## 2024-03-16 DIAGNOSIS — M6281 Muscle weakness (generalized): Secondary | ICD-10-CM

## 2024-03-16 NOTE — Therapy (Signed)
 OUTPATIENT PHYSICAL THERAPY PROSTHETICS TREATMENT - ARRIVED NO CHARGE    Patient Name: [name] MRN: [id number] DOB:Jan 15, 1966, 58 y.o., male Today's Date: 03/16/2024  PCP: Fritzi Nest, MD REFERRING PROVIDER: Harden Jerona GAILS, MD      END OF SESSION:  PT End of Session - 03/16/24 1430     Visit Number 10   Arrived no charge   Number of Visits 28   Plus eval   Date for Recertification  05/18/24   Recert   Authorization Type [employer]    PT Start Time 1428    PT Stop Time 1444   Arrived no charge   PT Time Calculation (min) 16 min    Equipment Utilized During Treatment Other (comment)   L BKA prosthetic   Activity Tolerance Patient limited by lethargy;Patient limited by fatigue    Behavior During Therapy Anxious;Lability;Restless           Past Medical History:  Diagnosis Date   ADHD (attention deficit hyperactivity disorder)    Anxiety and depression    Candida esophagitis (HCC)    in setting of DKA/hyperglycemia   CIDP (chronic inflammatory demyelinating polyneuropathy) (HCC)    Depression    Diabetes type 2, controlled (HCC)    Diverticulosis    Essential hypertension 06/21/2014   Hyperlipidemia    Kidney stones    Pancreatitis    Stomach ulcer    Vitamin D deficiency    Past Surgical History:  Procedure Laterality Date   AMPUTATION Left 10/16/2023   Procedure: LEFT BELOW KNEE AMPUTATION;  Surgeon: Harden Jerona GAILS, MD;  Location: [facility] OR;  Service: Orthopedics;  Laterality: Left;  LEFT BELOW KNEE AMPUTATION   ANKLE SURGERY     APPLICATION OF WOUND VAC Left 10/16/2023   Procedure: APPLICATION, WOUND VAC;  Surgeon: Harden Jerona GAILS, MD;  Location: [facility] OR;  Service: Orthopedics;  Laterality: Left;   CERVICAL DISC SURGERY     C4-C5   ELBOW SURGERY     ESOPHAGOGASTRODUODENOSCOPY N/A 07/16/2017   Procedure: ESOPHAGOGASTRODUODENOSCOPY (EGD);  Surgeon: Avram Lupita BRAVO, MD;  Location: [facility] ENDOSCOPY;  Service: Endoscopy;  Laterality: N/A;    IRRIGATION AND DEBRIDEMENT FOOT Left 07/31/2023   Procedure: IRRIGATION AND DEBRIDEMENT FOOT AND FIFTH MET RESCTION;  Surgeon: Malvin Marsa FALCON, DPM;  Location: [facility] OR;  Service: Orthopedics/Podiatry;  Laterality: Left;   RETINAL DETACHMENT SURGERY     RHINOPLASTY     VASECTOMY     WOUND DEBRIDEMENT Left 03/27/2018   Procedure: DEBRIDEMENT WOUND;  Surgeon: Gershon Donnice SAUNDERS, DPM;  Location: [facility] OR;  Service: Podiatry;  Laterality: Left;   Patient Active Problem List   Diagnosis Date Noted   Bipolar II disorder (HCC) 10/23/2023   Malnutrition of moderate degree 10/22/2023   Osteomyelitis of left foot (HCC) 10/21/2023   S/P BKA (below knee amputation) unilateral, left (HCC) 10/16/2023   Thrombophlebitis arm 08/03/2023   Hypokalemia 07/30/2023   Subacute osteomyelitis, left ankle and foot (HCC) 07/29/2023   Foot ulcer with fat layer exposed, left (HCC) 11/15/2019   Displaced fracture of shaft of left clavicle, initial encounter for closed fracture 03/15/2019   Dupuytren's contracture 07/29/2018   PICC (peripherally inserted central catheter) in place 05/24/2018   Neuropathy 05/12/2018   Osteomyelitis of ankle or foot, acute, left (HCC) 05/05/2018   Diabetic ulcer of left midfoot associated with type 2 diabetes mellitus, limited to breakdown of skin (HCC) 12/18/2017   Chronic pain syndrome 11/03/2017   Gastroesophageal reflux disease with esophagitis  without hemorrhage 09/17/2017   Esophageal dysphagia    DKA, type 2 (HCC) 07/15/2017   ADD (attention deficit disorder) 07/15/2017   DKA (diabetic ketoacidosis) (HCC) 07/15/2017   Vitamin B12 deficiency (non anemic) 08/13/2016   Adhesive capsulitis of left shoulder 01/18/2016   Trigger ring finger of right hand 10/11/2015   Primary osteoarthritis of first carpometacarpal joint of left hand 06/14/2015   Radial styloid tenosynovitis 06/14/2015   Depression 10/05/2014   Noncompliance with diabetes treatment 10/05/2014   Vitamin D  deficiency 09/14/2014   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   Diabetic peripheral neuropathy associated with type 2 diabetes mellitus (HCC) 06/21/2014   Essential hypertension 06/21/2014   Erectile dysfunction associated with type 2 diabetes mellitus (HCC) 06/21/2014   Long-term current use of insulin  for diabetes mellitus (HCC) 06/21/2014   Adhesive capsulitis of right shoulder 03/24/2011   Retinal detachment 12/06/2010   Rotator cuff syndrome 12/06/2010   History of pancreatitis 10/22/2010   Hypertriglyceridemia 10/22/2010   Diabetes mellitus type II, uncontrolled 10/22/2010    ONSET DATE: 01/06/2024 (referral)   REFERRING DIAG: S10.487 (ICD-10-CM) - S/P BKA (below knee amputation) unilateral, left (HCC)  THERAPY DIAG:  Other abnormalities of gait and mobility  Muscle weakness (generalized)  Repeated falls  Rationale for Evaluation and Treatment: Rehabilitation  SUBJECTIVE:   SUBJECTIVE STATEMENT: Pt presents w/RW and L BKA socket, wearing 2 ply socks. Pt reports he just woke up, is disoriented. No falls.   Pt accompanied by: self  PERTINENT HISTORY: T2DM, CIDP, 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 Pierce), IV antibiotics and hyperbaric tx ([facility]), status post below-knee amputation on the left from May 16  PAIN:  Are you having pain? No  PRECAUTIONS: Fall  RED FLAGS: None   WEIGHT BEARING RESTRICTIONS: No  FALLS: Has patient fallen in last 6 months? Yes. Number of falls a couple  LIVING ENVIRONMENT: Lives with: lives alone Lives in: House/apartment Home Access: Ramped entrance Home layout: One level Stairs: No Has following equipment at home: Environmental consultant - 2 wheeled, Wheelchair (manual), and Tour manager  PLOF: Independent and Leisure: Tennis and biking  PATIENT GOALS:  I wanna be able to run. I want to go above and beyond  what I was doing before.  I would like to walk into the football game with either nothing or a cane (First weekend in September)   OBJECTIVE:  Note: Objective measures were completed at Evaluation unless otherwise noted.  DIAGNOSTIC FINDINGS: None relevant on file   COGNITION: Overall cognitive status: Within functional limits for tasks assessed   SENSATION: Pt reports frequent phantom pain in LLE, most recently sensation is that his L ankle is sore, as if he has rolled his ankle   POSTURE: rounded shoulders  LOWER EXTREMITY ROM:  Active ROM Right eval Left eval  Hip flexion    Hip extension    Hip abduction    Hip adduction    Hip internal rotation    Hip external rotation    Knee flexion    Knee extension    Ankle dorsiflexion    Ankle plantarflexion    Ankle inversion    Ankle eversion     (Blank rows = not tested)  LOWER EXTREMITY MMT:  MMT Right eval Left eval  Hip flexion    Hip extension    Hip abduction    Hip adduction  Hip internal rotation    Hip external rotation    Knee flexion    Knee extension    Ankle dorsiflexion    Ankle plantarflexion    Ankle inversion    Ankle eversion    (Blank rows = not tested)  BED MOBILITY:  Pt reports independence with this   TRANSFERS: Sit to stand: Modified independence Stand to sit: Modified independence Pt demonstrates significant weight shift to R side w/posterior instability, but is able to perform without AD   RAMP: Not assessed on eval   CURB: Not assessed on eval   GAIT:  Gait pattern: step through pattern and lateral hip instability Distance walked: Various short clinic distances Assistive device utilized: Environmental consultant - 2 wheeled Level of assistance: SBA Comments: Pt entered and exited clinic w/RW, occasional scuffing of R foot noted     FUNCTIONAL TESTS:       CURRENT PROSTHETIC WEAR ASSESSMENT: Patient is independent with: skin check, residual limb care, care of non-amputated limb,  prosthetic cleaning, ply sock cleaning, and correct ply sock adjustment Patient is dependent with: proper wear schedule/adjustment and proper weight-bearing schedule/adjustment Donning prosthesis: Complete Independence Doffing prosthesis: Complete Independence Prosthetic wear tolerance: 8-10 hours/day, 7 days/week Prosthetic weight bearing tolerance: >45 minutes Residual limb condition: Intact residual limb w/well-healed incision, no adhesions noted w/good compliance to wearing shrinker. Pt w/long residual limb Prosthetic description: PTB BKA prosthetic w/Pin lock  K code/activity level with prosthetic use: Level 3                                                                                                                TREATMENT:  Arrived no charge    While walking into clinic, pt hitting obstacles on R side and lost balance posteriorly, requiring mod A from therapist to prevent fall to floor. Pt reports he slept from 10pm-1:30pm today, so does not think he is fully awake yet. Pt states his sister spent the night with him last night and he woke up briefly at 8am to greet her then went back to sleep. Pt states he woke up and felt as though he could not breathe, thinks he is having panic attacks again. Feels very overwhelmed with every thing right now, states he sleeps for >12 hours to escape. Provided therapeutic listening as appropriate and recommended pt call psychiatrist and counselor today and recommended pt start meeting with counselor more frequently to work on acceptance of BKA. Pt in agreement with this, thinks he also needs his meds adjusted. Pt reports he will call his psychiatrist and counselor today. Did not continue w/PT as pt reported feeling off and needing to use restroom. Attempted to escort pt out to restroom but pt requested therapist stay in therapy gym.    PATIENT EDUCATION: Education details: Encouragement to call psychiatrist and counselor today   Person educated:  Patient Education method: Explanation Education comprehension: verbalized understanding and needs further education  HOME EXERCISE PROGRAM: Access Code: [email or web address] URL: [email or web address] Date: 01/19/2024 Prepared by: Marlon Ski Polich  Exercises -  Diagonal Hip Extension with Resistance  - 1 x daily - 7 x weekly - 3 sets - 10 reps - Bird Dog  - 1 x daily - 7 x weekly - 3 sets - 10 reps - Side Stepping with Resistance at Thighs and Counter Support  - 1 x daily - 7 x weekly - 3 sets - 10 reps -Step taps at sink - Supine 90/90 Alternating Heel Touches with Posterior Pelvic Tilt  - 1 x daily - 7 x weekly - 3 sets - 10 reps -Wall bumps all directions x15 each direction once a day - Supine Dead Bug with Leg Extension  - 1 x daily - 7 x weekly - 1-2 sets - 10 reps  ASSESSMENT:  CLINICAL IMPRESSION: Arrived no charge as pt not feeling well and deemed unsafe to participate in PT today.   OBJECTIVE IMPAIRMENTS: Abnormal gait, decreased activity tolerance, decreased balance, decreased endurance, decreased mobility, difficulty walking, decreased strength, impaired sensation, improper body mechanics, prosthetic dependency , and pain  ACTIVITY LIMITATIONS: carrying, lifting, bending, squatting, stairs, transfers, bed mobility, bathing, dressing, reach over head, hygiene/grooming, and locomotion level  PARTICIPATION LIMITATIONS: shopping, community activity, occupation, and yard work  PERSONAL FACTORS: Past/current experiences and 1-2 comorbidities: CMT and L BKA are also affecting patient's functional outcome.   REHAB POTENTIAL: Excellent  CLINICAL DECISION MAKING: Evolving/moderate complexity  EVALUATION COMPLEXITY: Moderate   GOALS: Goals reviewed with patient? Yes  SHORT TERM GOALS: Target date: 02/11/2024   Pt will be independent with initial HEP for improved strength, balance, transfers and gait. Baseline: not established on eval  Goal status: MET   2.  Pt will  demonstrate a gait speed of >/=2.22 feet/sec in order to decrease risk for falls. Baseline: 2.02 ft/sec w/ 2WW; 1.89 ft/s w/RW (10/8) Goal status: NOT MET  3.  Pt will perform floor transfer w/CGA w/L prosthetic donned for improved fall recovery and safety at home  Baseline:  Goal status: NOT ASSESSED   4.  to be assessed and LTG updated  Baseline:  Goal status: DC   LONG TERM GOALS: Target date: 02/25/2024    Pt will be independent with final HEP for improved strength, balance, transfers and gait.  Baseline:  Goal status: IN PROGRESS   2.  Pt will demonstrate a gait speed of >/=2.42 feet/sec in order to decrease risk for falls. Baseline: 2.02 ft/sec w/ 2WW; 1.89 ft/s w/RW (10/8) Goal status: NOT MET   3.  goal  Baseline:  Goal status: DC  4.  Pt will perform floor transfer mod I  w/L prosthetic donned for improved fall recovery and safety at home  Baseline:  Goal status: NOT ASSESSED    NEW SHORT TERM GOALS:   Target date: 04/06/2024  Pt will ambulate greater than or equal to 700 feet on with LRAD mod I for improved cardiovascular endurance and BLE strength.   Baseline: 555' w/RW and SBA Goal status: INITIAL  2.  Pt will improve gait velocity to at least 2.2 ft/s w/LRAD for improved gait efficiency and independence  Baseline: 1.88 ft/s w/RW  Goal status: INITIAL  3.   Pt will perform floor transfer w/CGA w/L prosthetic donned for improved fall recovery and safety at home  Baseline:  Goal status: INITIAL   NEW LONG TERM GOALS:  Target date: 05/04/2024  Pt will ambulate greater than or equal to 850 feet on with LRAD mod I for improved cardiovascular endurance and BLE strength.   Baseline:  555' w/RW and SBA Goal status: INITIAL  2.  Pt will improve gait velocity to at least 2.6 ft/s w/LRAD for improved gait efficiency and independence  Baseline: 1.88 ft/s w/RW Goal status: INITIAL  3.  Pt will perform floor transfer mod I  w/L prosthetic  donned for improved fall recovery and safety at home  Baseline:  Goal status: INITIAL  4.  Pt will be independent with final HEP and gym program for improved strength, balance, transfers and gait. Baseline:  Goal status: IN PROGRESS     PLAN:  PT FREQUENCY: 2x/week  PT DURATION: 6 weeks + 10 weeks (recert)  PLANNED INTERVENTIONS: 02835- PT Re-evaluation, 97750- Physical Performance Testing, 97110-Therapeutic exercises, 97530- Therapeutic activity, [id number]- Neuromuscular re-education, 97535- Self Care, 02859- Manual therapy, [id number]- Gait training, [phone]- Orthotic/Prosthetic subsequent, [phone]- Aquatic Therapy, [phone]- Electrical stimulation (manual), [phone] (1-2 muscles), [phone] (3+ muscles)- Dry Needling, Patient/Family education, Balance training, Stair training, Joint mobilization, Spinal mobilization, Vestibular training, and DME instructions  PLAN FOR NEXT SESSION: review sock management.  Did he call psychiatrist? Glute strength of LLE. Walking with cane. Things he can do at the gym. Add to high level HEP for weightbearing tolerance on LLE, glute strength and hip extension to assist w/gait. Stairs, walking w/reduced support on AD, walking on unlevel surfaces, staggered sit to stands, RDLs, core stability.  Check skin!      Check all possible CPT codes: See Planned Interventions List for Planned CPT Codes    Check all conditions that are expected to impact treatment: Structural or anatomic abnormalities, Neurological condition and/or seizures, and Psychological or psychiatric disorders   If treatment provided at initial evaluation, no treatment charged due to lack of authorization.     Bryleigh Ottaway E Owais Pruett, PT, DPT 03/16/2024, 3:38 PM

## 2024-03-18 ENCOUNTER — Ambulatory Visit: Admitting: Podiatry

## 2024-03-22 ENCOUNTER — Ambulatory Visit: Admitting: Physical Therapy

## 2024-03-22 ENCOUNTER — Telehealth: Payer: Self-pay | Admitting: Physical Therapy

## 2024-03-22 NOTE — Telephone Encounter (Signed)
 Called pt and left VM regarding no-show to scheduled PT appointment. Informed pt of next appointment date and time and informed pt to call clinic if he is unable to make appointment.   Jackquline Branca E Nevia Henkin, PT, DPT

## 2024-03-24 ENCOUNTER — Ambulatory Visit: Admitting: Physical Therapy

## 2024-03-24 ENCOUNTER — Encounter: Payer: Self-pay | Admitting: Physical Therapy

## 2024-03-24 DIAGNOSIS — R296 Repeated falls: Secondary | ICD-10-CM

## 2024-03-24 DIAGNOSIS — R2689 Other abnormalities of gait and mobility: Secondary | ICD-10-CM | POA: Diagnosis not present

## 2024-03-24 DIAGNOSIS — M6281 Muscle weakness (generalized): Secondary | ICD-10-CM

## 2024-03-24 DIAGNOSIS — Z9181 History of falling: Secondary | ICD-10-CM

## 2024-03-24 DIAGNOSIS — G547 Phantom limb syndrome without pain: Secondary | ICD-10-CM

## 2024-03-24 DIAGNOSIS — R2681 Unsteadiness on feet: Secondary | ICD-10-CM

## 2024-03-24 NOTE — Therapy (Signed)
 OUTPATIENT PHYSICAL THERAPY PROSTHETICS TREATMENT  Patient Name: [name] MRN: [id number] DOB:[birth year], 58 y.o., male Today's Date: 03/24/2024  PCP: Fritzi Nest, MD REFERRING PROVIDER: Harden Jerona GAILS, MD      END OF SESSION:  PT End of Session - 03/24/24 1408     Visit Number 11    Number of Visits 28   Plus eval   Date for Recertification  05/18/24   Recert   Authorization Type [employer]    PT Start Time 1402    PT Stop Time 1448    PT Time Calculation (min) 46 min    Equipment Utilized During Treatment Other (comment);Gait belt   L BKA prosthetic   Activity Tolerance Patient limited by lethargy;Patient limited by fatigue    Behavior During Therapy [facility] for tasks assessed/performed;Restless           Past Medical History:  Diagnosis Date   ADHD (attention deficit hyperactivity disorder)    Anxiety and depression    Candida esophagitis (HCC)    in setting of DKA/hyperglycemia   CIDP (chronic inflammatory demyelinating polyneuropathy) (HCC)    Depression    Diabetes type 2, controlled (HCC)    Diverticulosis    Essential hypertension 06/21/2014   Hyperlipidemia    Kidney stones    Pancreatitis    Stomach ulcer    Vitamin D deficiency    Past Surgical History:  Procedure Laterality Date   AMPUTATION Left 10/16/2023   Procedure: LEFT BELOW KNEE AMPUTATION;  Surgeon: Harden Jerona GAILS, MD;  Location: [facility] OR;  Service: Orthopedics;  Laterality: Left;  LEFT BELOW KNEE AMPUTATION   ANKLE SURGERY     APPLICATION OF WOUND VAC Left 10/16/2023   Procedure: APPLICATION, WOUND VAC;  Surgeon: Harden Jerona GAILS, MD;  Location: [facility] OR;  Service: Orthopedics;  Laterality: Left;   CERVICAL DISC SURGERY     C4-C5   ELBOW SURGERY     ESOPHAGOGASTRODUODENOSCOPY N/A 07/16/2017   Procedure: ESOPHAGOGASTRODUODENOSCOPY (EGD);  Surgeon: Avram Lupita BRAVO, MD;  Location: [facility] ENDOSCOPY;  Service: Endoscopy;  Laterality: N/A;   IRRIGATION AND DEBRIDEMENT FOOT Left  07/31/2023   Procedure: IRRIGATION AND DEBRIDEMENT FOOT AND FIFTH MET RESCTION;  Surgeon: Malvin Marsa FALCON, DPM;  Location: [facility] OR;  Service: Orthopedics/Podiatry;  Laterality: Left;   RETINAL DETACHMENT SURGERY     RHINOPLASTY     VASECTOMY     WOUND DEBRIDEMENT Left 03/27/2018   Procedure: DEBRIDEMENT WOUND;  Surgeon: Gershon Donnice SAUNDERS, DPM;  Location: [facility] OR;  Service: Podiatry;  Laterality: Left;   Patient Active Problem List   Diagnosis Date Noted   Bipolar II disorder (HCC) 10/23/2023   Malnutrition of moderate degree 10/22/2023   Osteomyelitis of left foot (HCC) 10/21/2023   S/P BKA (below knee amputation) unilateral, left (HCC) 10/16/2023   Thrombophlebitis arm 08/03/2023   Hypokalemia 07/30/2023   Subacute osteomyelitis, left ankle and foot (HCC) 07/29/2023   Foot ulcer with fat layer exposed, left (HCC) 11/15/2019   Displaced fracture of shaft of left clavicle, initial encounter for closed fracture 03/15/2019   Dupuytren's contracture 07/29/2018   PICC (peripherally inserted central catheter) in place 05/24/2018   Neuropathy 05/12/2018   Osteomyelitis of ankle or foot, acute, left (HCC) 05/05/2018   Diabetic ulcer of left midfoot associated with type 2 diabetes mellitus, limited to breakdown of skin (HCC) 12/18/2017   Chronic pain syndrome 11/03/2017   Gastroesophageal reflux disease with esophagitis without hemorrhage 09/17/2017   Esophageal dysphagia  DKA, type 2 (HCC) 07/15/2017   ADD (attention deficit disorder) 07/15/2017   DKA (diabetic ketoacidosis) (HCC) 07/15/2017   Vitamin B12 deficiency (non anemic) 08/13/2016   Adhesive capsulitis of left shoulder 01/18/2016   Trigger ring finger of right hand 10/11/2015   Primary osteoarthritis of first carpometacarpal joint of left hand 06/14/2015   Radial styloid tenosynovitis 06/14/2015   Depression 10/05/2014   Noncompliance with diabetes treatment 10/05/2014   Vitamin D deficiency 09/14/2014   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   Diabetic peripheral neuropathy associated with type 2 diabetes mellitus (HCC) 06/21/2014   Essential hypertension 06/21/2014   Erectile dysfunction associated with type 2 diabetes mellitus (HCC) 06/21/2014   Long-term current use of insulin  for diabetes mellitus (HCC) 06/21/2014   Adhesive capsulitis of right shoulder 03/24/2011   Retinal detachment 12/06/2010   Rotator cuff syndrome 12/06/2010   History of pancreatitis 10/22/2010   Hypertriglyceridemia 10/22/2010   Diabetes mellitus type II, uncontrolled 10/22/2010    ONSET DATE: 01/06/2024 (referral)   REFERRING DIAG: S10.487 (ICD-10-CM) - S/P BKA (below knee amputation) unilateral, left (HCC)  THERAPY DIAG:  Other abnormalities of gait and mobility  Muscle weakness (generalized)  Repeated falls  Phantom limb syndrome without pain (HCC)  History of falling  Unsteadiness on feet  Rationale for Evaluation and Treatment: Rehabilitation  SUBJECTIVE:   SUBJECTIVE STATEMENT: Pt presents w/RW and L BKA socket, wearing 6 ply socks. He could only find a 1 this morning and it hurt so he added a 5-ply. 3 falls since last visit - reaching into dryer door and lost his grip hitting his elbow and left forearm w/ minor bruising, other 2 were just slipped and fell on my bottom.  Pt had neighbor, [name], drive him today as he cannot locate his keys (used ping on air tag but cannot localize where in his house they are so he had to leave his house unlocked.    Pt accompanied by: self  PERTINENT HISTORY: T2DM, CIDP, 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 Pierce), IV antibiotics and hyperbaric tx ([facility]), status post below-knee amputation on the left from May 16  PAIN:  Are you having pain? No  PRECAUTIONS: Fall  RED FLAGS: None   WEIGHT BEARING RESTRICTIONS:  No  FALLS: Has patient fallen in last 6 months? Yes. Number of falls a couple  LIVING ENVIRONMENT: Lives with: lives alone Lives in: House/apartment Home Access: Ramped entrance Home layout: One level Stairs: No Has following equipment at home: Environmental consultant - 2 wheeled, Wheelchair (manual), and Tour manager  PLOF: Independent and Leisure: Tennis and biking  PATIENT GOALS:  I wanna be able to run. I want to go above and beyond what I was doing before.  I would like to walk into the football game with either nothing or a cane (First weekend in September)   OBJECTIVE:  Note: Objective measures were completed at Evaluation unless otherwise noted.  DIAGNOSTIC FINDINGS: None relevant on file   COGNITION: Overall cognitive status: Within functional limits for tasks assessed   SENSATION: Pt reports frequent phantom pain in LLE, most recently sensation is that his L ankle is sore, as if he has rolled his ankle   POSTURE: rounded shoulders  LOWER EXTREMITY ROM:  Active ROM Right eval Left eval  Hip flexion    Hip extension    Hip abduction    Hip adduction  Hip internal rotation    Hip external rotation    Knee flexion    Knee extension    Ankle dorsiflexion    Ankle plantarflexion    Ankle inversion    Ankle eversion     (Blank rows = not tested)  LOWER EXTREMITY MMT:  MMT Right eval Left eval  Hip flexion    Hip extension    Hip abduction    Hip adduction    Hip internal rotation    Hip external rotation    Knee flexion    Knee extension    Ankle dorsiflexion    Ankle plantarflexion    Ankle inversion    Ankle eversion    (Blank rows = not tested)  BED MOBILITY:  Pt reports independence with this   TRANSFERS: Sit to stand: Modified independence Stand to sit: Modified independence Pt demonstrates significant weight shift to R side w/posterior instability, but is able to perform without AD   RAMP: Not assessed on eval   CURB: Not assessed on eval    GAIT:  Gait pattern: step through pattern and lateral hip instability Distance walked: Various short clinic distances Assistive device utilized: Environmental consultant - 2 wheeled Level of assistance: SBA Comments: Pt entered and exited clinic w/RW, occasional scuffing of R foot noted     FUNCTIONAL TESTS:       CURRENT PROSTHETIC WEAR ASSESSMENT: Patient is independent with: skin check, residual limb care, care of non-amputated limb, prosthetic cleaning, ply sock cleaning, and correct ply sock adjustment Patient is dependent with: proper wear schedule/adjustment and proper weight-bearing schedule/adjustment Donning prosthesis: Complete Independence Doffing prosthesis: Complete Independence Prosthetic wear tolerance: 8-10 hours/day, 7 days/week Prosthetic weight bearing tolerance: >45 minutes Residual limb condition: Intact residual limb w/well-healed incision, no adhesions noted w/good compliance to wearing shrinker. Pt w/long residual limb Prosthetic description: PTB BKA prosthetic w/Pin lock  K code/activity level with prosthetic use: Level 3                                                                                                                TREATMENT:  Self-care: -Assessed socks and edu to patient on sock numbers as he has a 5 and 3 donned vs initially thought 6-ply; edu on pin adjuster loop to prevent complete bottoming out -Pt has not called psychiatrist because he has an appt this coming Tuesday 10/28 - he reports his sister sent him a message last night but he is unsure what she said. -Residual limb assessed - no irritation noted or open areas, but he endorses he has slid off the bed onto the distal residual limb while trying to put pants on - no TTP - edu on safe transfers and how to stabilize for dressing EOB  NMR: -Unlevel obstacle course BUE support 4x6 ft - pt steps on obstacles and uses crossover when stepping around despite edu for improved motor  planning -Advance-retreat in L stance over 8 cones x6 w/ BUE support > x10 w/ LUE support > lateral step over in  L stance x8 > RUE support only for lateral step over x10  -R toe taps to 8 cone progressing to no UE support x30 w/ cues for TrA activation -Staggered STS w/ LLE in rear x12 - pt has difficulty maintaining position - edu he can practice this at home working on upward push vs outward or attempting left LE in rear on sitting  PATIENT EDUCATION: Education details:  Arm rest lock of w/c is broken - encouraged pt to call his insurance to see if they can direct him to the company for repair of rental (?) as he is unsure of company he obtained it from stating I got it from the hospital. Person educated: Patient Education method: Explanation Education comprehension: verbalized understanding and needs further education  HOME EXERCISE PROGRAM: Access Code: [email or web address] URL: [email or web address] Date: 01/19/2024 Prepared by: Marlon Plaster  Exercises - Diagonal Hip Extension with Resistance  - 1 x daily - 7 x weekly - 3 sets - 10 reps - Bird Dog  - 1 x daily - 7 x weekly - 3 sets - 10 reps - Side Stepping with Resistance at Thighs and Counter Support  - 1 x daily - 7 x weekly - 3 sets - 10 reps -Step taps at sink - Supine 90/90 Alternating Heel Touches with Posterior Pelvic Tilt  - 1 x daily - 7 x weekly - 3 sets - 10 reps -Wall bumps all directions x15 each direction once a day - Supine Dead Bug with Leg Extension  - 1 x daily - 7 x weekly - 1-2 sets - 10 reps  ASSESSMENT:  CLINICAL IMPRESSION: Pt safe to continue PT today.  Ongoing education on prosthetic sock adjustment and how to determine ply.  He continues to have LLE weakness which NMR was focused on improving motor recruitment for left weight shift needed to manage unlevel surfaces and general ambulatory mechanics.  He continues to benefit from skilled PT to optimize prosthetic management and safest upright  mobility.  Continue per POC.   OBJECTIVE IMPAIRMENTS: Abnormal gait, decreased activity tolerance, decreased balance, decreased endurance, decreased mobility, difficulty walking, decreased strength, impaired sensation, improper body mechanics, prosthetic dependency , and pain  ACTIVITY LIMITATIONS: carrying, lifting, bending, squatting, stairs, transfers, bed mobility, bathing, dressing, reach over head, hygiene/grooming, and locomotion level  PARTICIPATION LIMITATIONS: shopping, community activity, occupation, and yard work  PERSONAL FACTORS: Past/current experiences and 1-2 comorbidities: CMT and L BKA are also affecting patient's functional outcome.   REHAB POTENTIAL: Excellent  CLINICAL DECISION MAKING: Evolving/moderate complexity  EVALUATION COMPLEXITY: Moderate   GOALS: Goals reviewed with patient? Yes  SHORT TERM GOALS: Target date: 02/11/2024   Pt will be independent with initial HEP for improved strength, balance, transfers and gait. Baseline: not established on eval  Goal status: MET   2.  Pt will demonstrate a gait speed of >/=2.22 feet/sec in order to decrease risk for falls. Baseline: 2.02 ft/sec w/ 2WW; 1.89 ft/s w/RW (10/8) Goal status: NOT MET  3.  Pt will perform floor transfer w/CGA w/L prosthetic donned for improved fall recovery and safety at home  Baseline:  Goal status: NOT ASSESSED   4.  to be assessed and LTG updated  Baseline:  Goal status: DC   LONG TERM GOALS: Target date: 02/25/2024    Pt will be independent with final HEP for improved strength, balance, transfers and gait.  Baseline:  Goal status: IN PROGRESS   2.  Pt will demonstrate a gait speed of >/=2.42 feet/sec in  order to decrease risk for falls. Baseline: 2.02 ft/sec w/ 2WW; 1.89 ft/s w/RW (10/8) Goal status: NOT MET   3.  goal  Baseline:  Goal status: DC  4.  Pt will perform floor transfer mod I  w/L prosthetic donned for improved fall recovery and safety at home   Baseline:  Goal status: NOT ASSESSED    NEW SHORT TERM GOALS:   Target date: 04/06/2024  Pt will ambulate greater than or equal to 700 feet on with LRAD mod I for improved cardiovascular endurance and BLE strength.   Baseline: 555' w/RW and SBA Goal status: INITIAL  2.  Pt will improve gait velocity to at least 2.2 ft/s w/LRAD for improved gait efficiency and independence  Baseline: 1.88 ft/s w/RW  Goal status: INITIAL  3.   Pt will perform floor transfer w/CGA w/L prosthetic donned for improved fall recovery and safety at home  Baseline:  Goal status: INITIAL   NEW LONG TERM GOALS:  Target date: 05/04/2024  Pt will ambulate greater than or equal to 850 feet on with LRAD mod I for improved cardiovascular endurance and BLE strength.   Baseline: 555' w/RW and SBA Goal status: INITIAL  2.  Pt will improve gait velocity to at least 2.6 ft/s w/LRAD for improved gait efficiency and independence  Baseline: 1.88 ft/s w/RW Goal status: INITIAL  3.  Pt will perform floor transfer mod I  w/L prosthetic donned for improved fall recovery and safety at home  Baseline:  Goal status: INITIAL  4.  Pt will be independent with final HEP and gym program for improved strength, balance, transfers and gait. Baseline:  Goal status: IN PROGRESS     PLAN:  PT FREQUENCY: 2x/week  PT DURATION: 6 weeks + 10 weeks (recert)  PLANNED INTERVENTIONS: 02835- PT Re-evaluation, 97750- Physical Performance Testing, 97110-Therapeutic exercises, 97530- Therapeutic activity, [id number]- Neuromuscular re-education, 97535- Self Care, 02859- Manual therapy, [id number]- Gait training, [phone]- Orthotic/Prosthetic subsequent, [phone]- Aquatic Therapy, [phone]- Electrical stimulation (manual), [phone] (1-2 muscles), [phone] (3+ muscles)- Dry Needling, Patient/Family education, Balance training, Stair training, Joint mobilization, Spinal mobilization, Vestibular training, and DME instructions  PLAN FOR NEXT SESSION:  review sock management.  Did he call psychiatrist? Glute strength of LLE. Walking with cane. Things he can do at the gym. Add to high level HEP for weightbearing tolerance on LLE, glute strength and hip extension to assist w/gait. Stairs, walking w/reduced support on AD, walking on unlevel surfaces, staggered sit to stands, RDLs, core stability.  Check skin!      Check all possible CPT codes: See Planned Interventions List for Planned CPT Codes    Check all conditions that are expected to impact treatment: Structural or anatomic abnormalities, Neurological condition and/or seizures, and Psychological or psychiatric disorders   If treatment provided at initial evaluation, no treatment charged due to lack of authorization.     Daved KATHEE Bull, PT, DPT 03/24/2024, 2:58 PM

## 2024-03-25 ENCOUNTER — Telehealth: Payer: Self-pay | Admitting: Orthopedic Surgery

## 2024-03-25 NOTE — Telephone Encounter (Signed)
 Pt called stating that they need a prescription for a new wheel chair. Pt call back number is [phone]

## 2024-03-28 ENCOUNTER — Other Ambulatory Visit: Payer: Self-pay | Admitting: Medical Genetics

## 2024-03-28 DIAGNOSIS — Z006 Encounter for examination for normal comparison and control in clinical research program: Secondary | ICD-10-CM

## 2024-03-28 NOTE — Telephone Encounter (Signed)
 Message sent to pt through [facility]

## 2024-03-29 ENCOUNTER — Ambulatory Visit: Admitting: Physical Therapy

## 2024-03-31 ENCOUNTER — Encounter: Admitting: Physical Therapy

## 2024-04-04 ENCOUNTER — Encounter: Payer: Self-pay | Admitting: Radiology

## 2024-04-05 ENCOUNTER — Ambulatory Visit: Attending: Orthopedic Surgery | Admitting: Physical Therapy

## 2024-04-05 DIAGNOSIS — R2689 Other abnormalities of gait and mobility: Secondary | ICD-10-CM | POA: Insufficient documentation

## 2024-04-05 DIAGNOSIS — M6281 Muscle weakness (generalized): Secondary | ICD-10-CM | POA: Diagnosis present

## 2024-04-05 DIAGNOSIS — R296 Repeated falls: Secondary | ICD-10-CM | POA: Diagnosis present

## 2024-04-05 DIAGNOSIS — R2681 Unsteadiness on feet: Secondary | ICD-10-CM | POA: Insufficient documentation

## 2024-04-05 NOTE — Therapy (Signed)
 OUTPATIENT PHYSICAL THERAPY PROSTHETICS TREATMENT  Patient Name: [name] MRN: [id number] DOB:1965/09/29, 58 y.o., male Today's Date: 04/05/2024  PCP: Fritzi Nest, MD REFERRING PROVIDER: Harden Jerona GAILS, MD   END OF SESSION:  PT End of Session - 04/05/24 1240     Visit Number 12    Number of Visits 28   Plus eval   Date for Recertification  05/18/24   Recert   Authorization Type [employer]    PT Start Time 1238   Pt arrived late   PT Stop Time 1316    PT Time Calculation (min) 38 min    Equipment Utilized During Treatment Other (comment);Gait belt   L BKA prosthetic   Activity Tolerance Patient tolerated treatment well    Behavior During Therapy WFL for tasks assessed/performed;Restless           Past Medical History:  Diagnosis Date   ADHD (attention deficit hyperactivity disorder)    Anxiety and depression    Candida esophagitis (HCC)    in setting of DKA/hyperglycemia   CIDP (chronic inflammatory demyelinating polyneuropathy) (HCC)    Depression    Diabetes type 2, controlled (HCC)    Diverticulosis    Essential hypertension 06/21/2014   Hyperlipidemia    Kidney stones    Pancreatitis    Stomach ulcer    Vitamin D deficiency    Past Surgical History:  Procedure Laterality Date   AMPUTATION Left 10/16/2023   Procedure: LEFT BELOW KNEE AMPUTATION;  Surgeon: Harden Jerona GAILS, MD;  Location: [facility] OR;  Service: Orthopedics;  Laterality: Left;  LEFT BELOW KNEE AMPUTATION   ANKLE SURGERY     APPLICATION OF WOUND VAC Left 10/16/2023   Procedure: APPLICATION, WOUND VAC;  Surgeon: Harden Jerona GAILS, MD;  Location: [facility] OR;  Service: Orthopedics;  Laterality: Left;   CERVICAL DISC SURGERY     C4-C5   ELBOW SURGERY     ESOPHAGOGASTRODUODENOSCOPY N/A 07/16/2017   Procedure: ESOPHAGOGASTRODUODENOSCOPY (EGD);  Surgeon: Avram Lupita BRAVO, MD;  Location: [facility] ENDOSCOPY;  Service: Endoscopy;  Laterality: N/A;   IRRIGATION AND DEBRIDEMENT FOOT Left 07/31/2023    Procedure: IRRIGATION AND DEBRIDEMENT FOOT AND FIFTH MET RESCTION;  Surgeon: Malvin Marsa FALCON, DPM;  Location: [facility] OR;  Service: Orthopedics/Podiatry;  Laterality: Left;   RETINAL DETACHMENT SURGERY     RHINOPLASTY     VASECTOMY     WOUND DEBRIDEMENT Left 03/27/2018   Procedure: DEBRIDEMENT WOUND;  Surgeon: Gershon Donnice SAUNDERS, DPM;  Location: [facility] OR;  Service: Podiatry;  Laterality: Left;   Patient Active Problem List   Diagnosis Date Noted   Bipolar II disorder (HCC) 10/23/2023   Malnutrition of moderate degree 10/22/2023   Osteomyelitis of left foot (HCC) 10/21/2023   S/P BKA (below knee amputation) unilateral, left (HCC) 10/16/2023   Thrombophlebitis arm 08/03/2023   Hypokalemia 07/30/2023   Subacute osteomyelitis, left ankle and foot (HCC) 07/29/2023   Foot ulcer with fat layer exposed, left (HCC) 11/15/2019   Displaced fracture of shaft of left clavicle, initial encounter for closed fracture 03/15/2019   Dupuytren's contracture 07/29/2018   PICC (peripherally inserted central catheter) in place 05/24/2018   Neuropathy 05/12/2018   Osteomyelitis of ankle or foot, acute, left (HCC) 05/05/2018   Diabetic ulcer of left midfoot associated with type 2 diabetes mellitus, limited to breakdown of skin (HCC) 12/18/2017   Chronic pain syndrome 11/03/2017   Gastroesophageal reflux disease with esophagitis without hemorrhage 09/17/2017   Esophageal dysphagia    DKA, type  2 (HCC) 07/15/2017   ADD (attention deficit disorder) 07/15/2017   DKA (diabetic ketoacidosis) (HCC) 07/15/2017   Vitamin B12 deficiency (non anemic) 08/13/2016   Adhesive capsulitis of left shoulder 01/18/2016   Trigger ring finger of right hand 10/11/2015   Primary osteoarthritis of first carpometacarpal joint of left hand 06/14/2015   Radial styloid tenosynovitis 06/14/2015   Depression 10/05/2014   Noncompliance with diabetes treatment 10/05/2014   Vitamin D deficiency 09/14/2014   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   Diabetic peripheral neuropathy associated with type 2 diabetes mellitus (HCC) 06/21/2014   Essential hypertension 06/21/2014   Erectile dysfunction associated with type 2 diabetes mellitus (HCC) 06/21/2014   Long-term current use of insulin  for diabetes mellitus (HCC) 06/21/2014   Adhesive capsulitis of right shoulder 03/24/2011   Retinal detachment 12/06/2010   Rotator cuff syndrome 12/06/2010   History of pancreatitis 10/22/2010   Hypertriglyceridemia 10/22/2010   Diabetes mellitus type II, uncontrolled 10/22/2010    ONSET DATE: 01/06/2024 (referral)   REFERRING DIAG: S10.487 (ICD-10-CM) - S/P BKA (below knee amputation) unilateral, left (HCC)  THERAPY DIAG:  Other abnormalities of gait and mobility  Muscle weakness (generalized)  Repeated falls  Unsteadiness on feet  Rationale for Evaluation and Treatment: Rehabilitation  SUBJECTIVE:   SUBJECTIVE STATEMENT: Pt presents w/RW and L BKA socket, wearing 8 ply socks. States he twisted his ankle last week in his bathroom, got his foot caught between the toilet and the tub. Ankle is feeling better now. Denies other falls.   States he spoke to his psychiatrist and had some medications tweaked. Stopped taking the adderall.   Still has a lot of anxiety regarding where my life is going. Is working through this with his medical team and his sister.   Got a new WC yesterday, had it delivered to the house.   Pt accompanied by: self  PERTINENT HISTORY: T2DM, CIDP, 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 Pierce), IV antibiotics and hyperbaric tx ([facility]), status post below-knee amputation on the left from May 16  PAIN:  Are you having pain? No  PRECAUTIONS: Fall  RED FLAGS: None   WEIGHT BEARING RESTRICTIONS: No  FALLS: Has patient fallen in last 6 months? Yes. Number of falls a  couple  LIVING ENVIRONMENT: Lives with: lives alone Lives in: House/apartment Home Access: Ramped entrance Home layout: One level Stairs: No Has following equipment at home: Environmental Consultant - 2 wheeled, Wheelchair (manual), and Tour manager  PLOF: Independent and Leisure: Tennis and biking  PATIENT GOALS:  I wanna be able to run. I want to go above and beyond what I was doing before.  I would like to walk into the football game with either nothing or a cane (First weekend in September)   OBJECTIVE:  Note: Objective measures were completed at Evaluation unless otherwise noted.  DIAGNOSTIC FINDINGS: None relevant on file   COGNITION: Overall cognitive status: Within functional limits for tasks assessed   SENSATION: Pt reports frequent phantom pain in LLE, most recently sensation is that his L ankle is sore, as if he has rolled his ankle   POSTURE: rounded shoulders  LOWER EXTREMITY ROM:  Active ROM Right eval Left eval  Hip flexion    Hip extension    Hip abduction    Hip adduction    Hip internal rotation    Hip external rotation    Knee flexion  Knee extension    Ankle dorsiflexion    Ankle plantarflexion    Ankle inversion    Ankle eversion     (Blank rows = not tested)  LOWER EXTREMITY MMT:  MMT Right eval Left eval  Hip flexion    Hip extension    Hip abduction    Hip adduction    Hip internal rotation    Hip external rotation    Knee flexion    Knee extension    Ankle dorsiflexion    Ankle plantarflexion    Ankle inversion    Ankle eversion    (Blank rows = not tested)  BED MOBILITY:  Pt reports independence with this   TRANSFERS: Sit to stand: Modified independence Stand to sit: Modified independence Pt demonstrates significant weight shift to R side w/posterior instability, but is able to perform without AD   RAMP: Not assessed on eval   CURB: Not assessed on eval   GAIT:  Gait pattern: step through pattern and lateral hip  instability Distance walked: Various short clinic distances Assistive device utilized: Environmental Consultant - 2 wheeled Level of assistance: SBA Comments: Pt entered and exited clinic w/RW, occasional scuffing of R foot noted     FUNCTIONAL TESTS:       CURRENT PROSTHETIC WEAR ASSESSMENT: Patient is independent with: skin check, residual limb care, care of non-amputated limb, prosthetic cleaning, ply sock cleaning, and correct ply sock adjustment Patient is dependent with: proper wear schedule/adjustment and proper weight-bearing schedule/adjustment Donning prosthesis: Complete Independence Doffing prosthesis: Complete Independence Prosthetic wear tolerance: 8-10 hours/day, 7 days/week Prosthetic weight bearing tolerance: >45 minutes Residual limb condition: Intact residual limb w/well-healed incision, no adhesions noted w/good compliance to wearing shrinker. Pt w/long residual limb Prosthetic description: PTB BKA prosthetic w/Pin lock  K code/activity level with prosthetic use: Level 3                                                                                                                TREATMENT:   Ther Act/NMR Cued pt on proper turn technique w/RW as he continues to place RW away from him and plop into chair w/chair tilting to R side. Pt frustrated that he continues to turn incorrectly and reports he has tried to work on this at home.  Discussed recent visit w/[facility] and pt reports he forgot to give [place] the letters from [place] but did work on merchandiser, retail and reducing frequency of pin getting stuck.  Provided therapeutic listening as pt discussed what he has been working on at home (Core exercises, walking more).  SciFit multi-peaks level 8.0 for 8 minutes using BLEs only for neural priming for reciprocal movement, dynamic cardiovascular warmup and increased amplitude of stepping.   In // bars for improved step clearance, LE coordination and weightbearing tolerance on LLE:  Fwd  6 hurdle navigation using step-through pattern and BUE support, x4 reps. Pt able to clear hurdle well w/LLE if leading w/RLE, but relied on circumduction to clear if leading w/LLE.  Lateral 6 hurdle  navigation w/BUE support x2 reps and single UE support x2 reps w/CGA. Pt performed well w/BUE support w/no catching of L foot noted. However, pt challenged when stepping to L w/single UE support, w/decreased weightbearing on LLE noted.   PATIENT EDUCATION: Education details:  Continue HEP, continue working on merchandiser, retail, proper turns w/RW Person educated: Patient Education method: Programmer, Multimedia, Facilities Manager, and Verbal cues Education comprehension: verbalized understanding, returned demonstration, verbal cues required, and needs further education  HOME EXERCISE PROGRAM: Access Code: [email or web address] URL: [email or web address] Date: 01/19/2024 Prepared by: Marlon Seiji Wiswell  Exercises - Diagonal Hip Extension with Resistance  - 1 x daily - 7 x weekly - 3 sets - 10 reps - Bird Dog  - 1 x daily - 7 x weekly - 3 sets - 10 reps - Side Stepping with Resistance at Thighs and Counter Support  - 1 x daily - 7 x weekly - 3 sets - 10 reps -Step taps at sink - Supine 90/90 Alternating Heel Touches with Posterior Pelvic Tilt  - 1 x daily - 7 x weekly - 3 sets - 10 reps -Wall bumps all directions x15 each direction once a day - Supine Dead Bug with Leg Extension  - 1 x daily - 7 x weekly - 1-2 sets - 10 reps  ASSESSMENT:  CLINICAL IMPRESSION: Emphasis of skilled PT session on providing therapeutic listening regarding medical updates and mental health, single leg stability, step clearance and weightbearing tolerance on LLE. Pt reports he did get a new WC and had some medications changed by psychiatrist that is still taking adjustment. Pt twisted his ankle in between his toilet and tub shower last week, causing him to miss a week of PT. Pt continues to turn too quickly without RW near him, resulting  in uncontrolled lower to chair and frequent falls. Suspect this is how pt twisted his ankle as well. Pt demonstrates adequate step clearance w/LLE when provided BUE support, but struggles to fully place weight on LLE w/reduced UE reliance, resulting in asymmetric stepping and poor eccentric control. Continue per POC.   OBJECTIVE IMPAIRMENTS: Abnormal gait, decreased activity tolerance, decreased balance, decreased endurance, decreased mobility, difficulty walking, decreased strength, impaired sensation, improper body mechanics, prosthetic dependency , and pain  ACTIVITY LIMITATIONS: carrying, lifting, bending, squatting, stairs, transfers, bed mobility, bathing, dressing, reach over head, hygiene/grooming, and locomotion level  PARTICIPATION LIMITATIONS: shopping, community activity, occupation, and yard work  PERSONAL FACTORS: Past/current experiences and 1-2 comorbidities: CMT and L BKA are also affecting patient's functional outcome.   REHAB POTENTIAL: Excellent  CLINICAL DECISION MAKING: Evolving/moderate complexity  EVALUATION COMPLEXITY: Moderate   GOALS: Goals reviewed with patient? Yes  SHORT TERM GOALS: Target date: 02/11/2024   Pt will be independent with initial HEP for improved strength, balance, transfers and gait. Baseline: not established on eval  Goal status: MET   2.  Pt will demonstrate a gait speed of >/=2.22 feet/sec in order to decrease risk for falls. Baseline: 2.02 ft/sec w/ 2WW; 1.89 ft/s w/RW (10/8) Goal status: NOT MET  3.  Pt will perform floor transfer w/CGA w/L prosthetic donned for improved fall recovery and safety at home  Baseline:  Goal status: NOT ASSESSED   4.  to be assessed and LTG updated  Baseline:  Goal status: DC   LONG TERM GOALS: Target date: 02/25/2024    Pt will be independent with final HEP for improved strength, balance, transfers and gait.  Baseline:  Goal status: IN PROGRESS   2.  Pt will  demonstrate a gait speed of  >/=2.42 feet/sec in order to decrease risk for falls. Baseline: 2.02 ft/sec w/ 2WW; 1.89 ft/s w/RW (10/8) Goal status: NOT MET   3.  goal  Baseline:  Goal status: DC  4.  Pt will perform floor transfer mod I  w/L prosthetic donned for improved fall recovery and safety at home  Baseline:  Goal status: NOT ASSESSED    NEW SHORT TERM GOALS:   Target date: 04/06/2024  Pt will ambulate greater than or equal to 700 feet on with LRAD mod I for improved cardiovascular endurance and BLE strength.   Baseline: 555' w/RW and SBA Goal status: INITIAL  2.  Pt will improve gait velocity to at least 2.2 ft/s w/LRAD for improved gait efficiency and independence  Baseline: 1.88 ft/s w/RW  Goal status: INITIAL  3.   Pt will perform floor transfer w/CGA w/L prosthetic donned for improved fall recovery and safety at home  Baseline:  Goal status: INITIAL   NEW LONG TERM GOALS:  Target date: 05/04/2024  Pt will ambulate greater than or equal to 850 feet on with LRAD mod I for improved cardiovascular endurance and BLE strength.   Baseline: 555' w/RW and SBA Goal status: INITIAL  2.  Pt will improve gait velocity to at least 2.6 ft/s w/LRAD for improved gait efficiency and independence  Baseline: 1.88 ft/s w/RW Goal status: INITIAL  3.  Pt will perform floor transfer mod I  w/L prosthetic donned for improved fall recovery and safety at home  Baseline:  Goal status: INITIAL  4.  Pt will be independent with final HEP and gym program for improved strength, balance, transfers and gait. Baseline:  Goal status: IN PROGRESS     PLAN:  PT FREQUENCY: 2x/week  PT DURATION: 6 weeks + 10 weeks (recert)  PLANNED INTERVENTIONS: 02835- PT Re-evaluation, 97750- Physical Performance Testing, 97110-Therapeutic exercises, 97530- Therapeutic activity, [id number]- Neuromuscular re-education, 97535- Self Care, 02859- Manual therapy, [id number]- Gait training, [phone]- Orthotic/Prosthetic subsequent,  [phone]- Aquatic Therapy, [phone]- Electrical stimulation (manual), [phone] (1-2 muscles), [id number] (3+ muscles)- Dry Needling, Patient/Family education, Balance training, Stair training, Joint mobilization, Spinal mobilization, Vestibular training, and DME instructions  PLAN FOR NEXT SESSION: review sock management.  Did he call psychiatrist? Glute strength of LLE. Walking with cane. Things he can do at the gym. Add to high level HEP for weightbearing tolerance on LLE, glute strength and hip extension to assist w/gait. Stairs, walking w/reduced support on AD, walking on unlevel surfaces, staggered sit to stands, RDLs, core stability.  Check skin!      Check all possible CPT codes: See Planned Interventions List for Planned CPT Codes    Check all conditions that are expected to impact treatment: Structural or anatomic abnormalities, Neurological condition and/or seizures, and Psychological or psychiatric disorders   If treatment provided at initial evaluation, no treatment charged due to lack of authorization.     Elan Mcelvain E Shareena Nusz, PT, DPT 04/05/2024, 1:21 PM

## 2024-04-07 ENCOUNTER — Ambulatory Visit: Admitting: Physical Therapy

## 2024-04-12 ENCOUNTER — Ambulatory Visit: Admitting: Physical Therapy

## 2024-04-12 ENCOUNTER — Telehealth: Payer: Self-pay | Admitting: Physical Therapy

## 2024-04-12 ENCOUNTER — Encounter: Payer: Self-pay | Admitting: Physical Therapy

## 2024-04-12 NOTE — Telephone Encounter (Signed)
 This is to document my attempt to call patient after no-show for PT appt this PM.  This is patient's # 6 missed appt (no shows only).   Primary phone number(s) was used in efforts to contact the patient.   Voice mail was left informing pt of canceling remaining visits due to no show policy.  Did encourage pt to follow-up with psychiatrist as he continues to have unmet needs surrounding his mental health and coping.  He was also encouraged to maintain medical follow-ups and [facility] appts to further improve prosthetic management.  Pt being discharged at this time until he is better medically managed as this has significantly impacted POC.  Daved Bull, PT, DPT

## 2024-04-12 NOTE — Therapy (Signed)
 [facility] [address] [place], [place], [address] Phone: [phone]   Fax:  [phone]  Patient Details - DISCHARGE SUMMARY Name: [name] MRN: [id number] Date of Birth: Jan 28, 1966 Referring Provider:  No ref. provider found  Encounter Date: 04/12/2024  Pt no show to appt at 115pm 04/12/2024.  Voice mail was left informing pt of canceling remaining visits due to no show policy as this was 6th no show.  Did encourage pt to follow-up with psychiatrist as he continues to have unmet needs surrounding his mental health and coping.  He was also encouraged to maintain medical follow-ups and [facility] appts to further improve prosthetic management.  Pt being discharged at this time until he is better medically managed as this has significantly impacted POC.   Daved KATHEE Bull, PT, DPT 04/12/2024, 1:42 PM  [facility] [address] [place], [place], [address] Phone: [phone]   Fax:  [phone]

## 2024-04-14 ENCOUNTER — Ambulatory Visit: Admitting: Physical Therapy

## 2024-04-19 ENCOUNTER — Ambulatory Visit (INDEPENDENT_AMBULATORY_CARE_PROVIDER_SITE_OTHER): Admitting: Family

## 2024-04-19 ENCOUNTER — Encounter: Admitting: Physical Therapy

## 2024-04-19 ENCOUNTER — Encounter: Payer: Self-pay | Admitting: Family

## 2024-04-19 DIAGNOSIS — M25562 Pain in left knee: Secondary | ICD-10-CM | POA: Diagnosis not present

## 2024-04-19 MED ORDER — METHYLPREDNISOLONE ACETATE 40 MG/ML IJ SUSP
40.0000 mg | INTRAMUSCULAR | Status: AC | PRN
  Administered 2024-04-19: 40 mg via INTRA_ARTICULAR

## 2024-04-19 MED ORDER — LIDOCAINE HCL 1 % IJ SOLN
5.0000 mL | INTRAMUSCULAR | Status: AC | PRN
  Administered 2024-04-19: 5 mL

## 2024-04-19 NOTE — Progress Notes (Signed)
 Office Visit Note   Patient: [name]           Date of Birth: [birth year]           MRN: [id number] Visit Date: 04/19/2024              Requested by: Fritzi Nest, MD [address] [place],  [place] [address] PCP: Fritzi Nest, MD  Chief Complaint  Patient presents with   Left Knee - Pain      HPI: The patient is a 58 year old gentleman who presents today complaining of left knee pain he has not had any falls associated with this pain however he does feel like he twisted it and has felt like it has been twisted up underneath him since that time he denies any giving way.  Has been ongoing for 6 weeks now  associated swelling and bruising  Of note is status post below-knee amputation on the left.  Has been full weightbearing in his prosthesis doing well until recently Assessment & Plan: Visit Diagnoses: No diagnosis found.  Plan: Depo-Medrol  injection left knee.  Patient tolerated well.  Follow-Up Instructions: No follow-ups on file.   Left Knee Exam   Tenderness  The patient is experiencing tenderness in the medial joint line and lateral joint line.  Range of Motion  The patient has normal left knee ROM.  Tests  Varus: negative Valgus: negative  Other  Erythema: absent Swelling: none Effusion: no effusion present      Patient is alert, oriented, no adenopathy, well-dressed, normal affect, normal respiratory effort.     Imaging: No results found. No images are attached to the encounter.  Labs: Lab Results  Component Value Date   HGBA1C 7.7 (H) 10/16/2023   HGBA1C 10.9 (H) 07/30/2023   HGBA1C 10.7 (H) 03/27/2018   ESRSEDRATE 4 08/06/2021   ESRSEDRATE 6 01/08/2021   ESRSEDRATE 40 (H) 03/26/2018   CRP 6 08/06/2021   CRP 4.9 01/08/2021   REPTSTATUS 08/08/2023 FINAL 08/03/2023   GRAMSTAIN  07/31/2023    RARE WBC PRESENT, PREDOMINANTLY PMN RARE GRAM POSITIVE COCCI IN PAIRS    CULT  08/03/2023    NO GROWTH 5  DAYS Performed at [facility], [address], [place], [place] [address]    LABORGA ENTEROBACTER CLOACAE 07/31/2023     Lab Results  Component Value Date   ALBUMIN 3.0 (L) 10/21/2023   ALBUMIN 3.8 10/16/2023   ALBUMIN 4.2 09/22/2023   PREALBUMIN 22 10/16/2023    Lab Results  Component Value Date   MG 2.2 07/31/2023   MG 1.7 07/30/2023   MG 1.9 07/29/2023   No results found for: [facility]  Lab Results  Component Value Date   PREALBUMIN 22 10/16/2023      Latest Ref Rng & Units 10/29/2023    7:08 AM 10/26/2023    5:49 AM 10/22/2023    5:56 AM  CBC EXTENDED  WBC 4.0 - 10.5 K/uL 7.7  7.3  4.8   RBC 4.22 - 5.81 MIL/uL 4.09  3.90  3.70   Hemoglobin 13.0 - 17.0 g/dL 88.0  88.7  89.1   HCT 39.0 - 52.0 % 35.6  33.6  31.8   Platelets 150 - 400 K/uL 404  373  248      There is no height or weight on file to calculate BMI.  Orders:  No orders of the defined types were placed in this encounter.  No orders of the defined types were placed  in this encounter.    Procedures: Large Joint Inj: L knee on 04/19/2024 9:59 AM Indications: pain Details: 18 G 1.5 in needle, anteromedial approach Medications: 5 mL lidocaine  1 %; 40 mg methylPREDNISolone  acetate 40 MG/ML Consent was given by the patient.      Clinical Data: No additional findings.  ROS:  All other systems negative, except as noted in the HPI. Review of Systems  Objective: Vital Signs: There were no vitals taken for this visit.  Specialty Comments:  No specialty comments available.  PMFS History: Patient Active Problem List   Diagnosis Date Noted   Bipolar II disorder (HCC) 10/23/2023   Malnutrition of moderate degree 10/22/2023   Osteomyelitis of left foot (HCC) 10/21/2023   S/P BKA (below knee amputation) unilateral, left (HCC) 10/16/2023   Thrombophlebitis arm 08/03/2023   Hypokalemia 07/30/2023   Subacute osteomyelitis, left ankle and foot (HCC) 07/29/2023   Foot ulcer with fat layer  exposed, left (HCC) 11/15/2019   Displaced fracture of shaft of left clavicle, initial encounter for closed fracture 03/15/2019   Dupuytren's contracture 07/29/2018   PICC (peripherally inserted central catheter) in place 05/24/2018   Neuropathy 05/12/2018   Osteomyelitis of ankle or foot, acute, left (HCC) 05/05/2018   Diabetic ulcer of left midfoot associated with type 2 diabetes mellitus, limited to breakdown of skin (HCC) 12/18/2017   Chronic pain syndrome 11/03/2017   Gastroesophageal reflux disease with esophagitis without hemorrhage 09/17/2017   Esophageal dysphagia    DKA, type 2 (HCC) 07/15/2017   ADD (attention deficit disorder) 07/15/2017   DKA (diabetic ketoacidosis) (HCC) 07/15/2017   Vitamin B12 deficiency (non anemic) 08/13/2016   Adhesive capsulitis of left shoulder 01/18/2016   Trigger ring finger of right hand 10/11/2015   Primary osteoarthritis of first carpometacarpal joint of left hand 06/14/2015   Radial styloid tenosynovitis 06/14/2015   Depression 10/05/2014   Noncompliance with diabetes treatment 10/05/2014   Vitamin D deficiency 09/14/2014   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   Diabetic peripheral neuropathy associated with type 2 diabetes mellitus (HCC) 06/21/2014   Essential hypertension 06/21/2014   Erectile dysfunction associated with type 2 diabetes mellitus (HCC) 06/21/2014   Long-term current use of insulin  for diabetes mellitus (HCC) 06/21/2014   Adhesive capsulitis of right shoulder 03/24/2011   Retinal detachment 12/06/2010   Rotator cuff syndrome 12/06/2010   History of pancreatitis 10/22/2010   Hypertriglyceridemia 10/22/2010   Diabetes mellitus type II, uncontrolled 10/22/2010   Past Medical History:  Diagnosis Date   ADHD (attention deficit hyperactivity disorder)    Anxiety and depression    Candida esophagitis (HCC)    in setting of DKA/hyperglycemia   CIDP (chronic inflammatory demyelinating polyneuropathy) (HCC)     Depression    Diabetes type 2, controlled (HCC)    Diverticulosis    Essential hypertension 06/21/2014   Hyperlipidemia    Kidney stones    Pancreatitis    Stomach ulcer    Vitamin D deficiency     Family History  Problem Relation Age of Onset   Stroke Father    Breast cancer Mother    Colon cancer Neg Hx     Past Surgical History:  Procedure Laterality Date   AMPUTATION Left 10/16/2023   Procedure: LEFT BELOW KNEE AMPUTATION;  Surgeon: Harden Jerona GAILS, MD;  Location: [facility] OR;  Service: Orthopedics;  Laterality: Left;  LEFT BELOW KNEE AMPUTATION   ANKLE SURGERY     APPLICATION OF WOUND VAC Left 10/16/2023   Procedure: APPLICATION, WOUND VAC;  Surgeon: Harden Jerona GAILS, MD;  Location: [facility] OR;  Service: Orthopedics;  Laterality: Left;   CERVICAL DISC SURGERY     C4-C5   ELBOW SURGERY     ESOPHAGOGASTRODUODENOSCOPY N/A 07/16/2017   Procedure: ESOPHAGOGASTRODUODENOSCOPY (EGD);  Surgeon: Avram Lupita BRAVO, MD;  Location: [facility] ENDOSCOPY;  Service: Endoscopy;  Laterality: N/A;   IRRIGATION AND DEBRIDEMENT FOOT Left 07/31/2023   Procedure: IRRIGATION AND DEBRIDEMENT FOOT AND FIFTH MET RESCTION;  Surgeon: Malvin Marsa FALCON, DPM;  Location: [facility] OR;  Service: Orthopedics/Podiatry;  Laterality: Left;   RETINAL DETACHMENT SURGERY     RHINOPLASTY     VASECTOMY     WOUND DEBRIDEMENT Left 03/27/2018   Procedure: DEBRIDEMENT WOUND;  Surgeon: Gershon Donnice SAUNDERS, DPM;  Location: [facility] OR;  Service: Podiatry;  Laterality: Left;   Social History   Occupational History   Occupation: Market Researcher: [employer]  Tobacco Use   Smoking status: Former   Smokeless tobacco: Former  Building Services Engineer status: Never Used  Substance and Sexual Activity   Alcohol use: Not Currently    Comment: Socially   Drug use: No   Sexual activity: Not Currently

## 2024-04-21 ENCOUNTER — Encounter: Admitting: Physical Therapy

## 2024-04-26 ENCOUNTER — Encounter: Admitting: Physical Therapy

## 2024-05-10 ENCOUNTER — Ambulatory Visit: Admitting: Physician Assistant

## 2024-05-11 ENCOUNTER — Encounter: Payer: Self-pay | Admitting: Physician Assistant

## 2024-05-11 ENCOUNTER — Ambulatory Visit: Admitting: Physician Assistant

## 2024-05-11 DIAGNOSIS — M25562 Pain in left knee: Secondary | ICD-10-CM | POA: Diagnosis not present

## 2024-05-11 MED ORDER — OXYCODONE-ACETAMINOPHEN 5-325 MG PO TABS: 1.0000 | ORAL_TABLET | Freq: Three times a day (TID) | ORAL | 0 refills | Status: AC | PRN

## 2024-05-11 NOTE — Progress Notes (Addendum)
 "  Office Visit Note   Patient: [name]           Date of Birth: 12-23-1965           MRN: [id number] Visit Date: 05/11/2024              Requested by: Fritzi Nest, MD [address] [place],  [place] [address] PCP: Fritzi Nest, MD  Chief Complaint  Patient presents with   Left Leg - Pain      HPI: 58 y/o male s/p left BKA with left medial knee pain.  He was last seen on 04/19/24 and given a steroid knee injection.  He states it may have helped a little.  He dose not know what causes the pain and it is not frequent.  He states it seems to be on the inside of the knee.  When it happens if he sits down for a little while it seems to go away.  He denies swelling or erythema.    Assessment & Plan: Visit Diagnoses:  1. Acute pain of left knee     Plan: His options will be MRI of the left knee to r/o soft tissue cause verse [facility] for proper fit test of his prosthesis.  He states he has lost 10-15 lbs over the past 3-4 months since he has worn his prosthesis.  He will call the office if he wants to peruse an MRI for further testing.  He will try to pay attention to where the pain is if it reoccurs and what causes it and/or relieves it.    Follow-Up Instructions: Return if symptoms worsen or fail to improve.   Ortho Exam  Patient is alert, oriented, no adenopathy, well-dressed, normal affect, normal respiratory effort. Non tenderness to palpation on the left knee medial and lateral joint lines.  No edema or cellulitis.  No crepitus to flexion and extension.  He points to an area medially just below the joint line as to wear he thinks the pain occurs.  I ca not reproduce the pain with palpation.      Imaging: No results found. No images are attached to the encounter.  Labs: Lab Results  Component Value Date   HGBA1C 7.7 (H) 10/16/2023   HGBA1C 10.9 (H) 07/30/2023   HGBA1C 10.7 (H) 03/27/2018   ESRSEDRATE 4 08/06/2021   ESRSEDRATE 6  01/08/2021   ESRSEDRATE 40 (H) 03/26/2018   CRP 6 08/06/2021   CRP 4.9 01/08/2021   REPTSTATUS 08/08/2023 FINAL 08/03/2023   GRAMSTAIN  07/31/2023    RARE WBC PRESENT, PREDOMINANTLY PMN RARE GRAM POSITIVE COCCI IN PAIRS    CULT  08/03/2023    NO GROWTH 5 DAYS Performed at [facility], [address] [place], [place] [address]    LABORGA ENTEROBACTER CLOACAE 07/31/2023     Lab Results  Component Value Date   ALBUMIN 3.0 (L) 10/21/2023   ALBUMIN 3.8 10/16/2023   ALBUMIN 4.2 09/22/2023   PREALBUMIN 22 10/16/2023    Lab Results  Component Value Date   MG 2.2 07/31/2023   MG 1.7 07/30/2023   MG 1.9 07/29/2023   No results found for: [facility]  Lab Results  Component Value Date   PREALBUMIN 22 10/16/2023      Latest Ref Rng & Units 10/29/2023    7:08 AM 10/26/2023    5:49 AM 10/22/2023    5:56 AM  CBC EXTENDED  WBC 4.0 - 10.5 K/uL 7.7  7.3  4.8   RBC 4.22 - 5.81 MIL/uL 4.09  3.90  3.70   Hemoglobin 13.0 - 17.0 g/dL 88.0  88.7  89.1   HCT 39.0 - 52.0 % 35.6  33.6  31.8   Platelets 150 - 400 K/uL 404  373  248      There is no height or weight on file to calculate BMI.  Orders:  No orders of the defined types were placed in this encounter.  Meds ordered this encounter  Medications   oxyCODONE -acetaminophen  (PERCOCET/ROXICET) 5-325 MG tablet    Sig: Take 1 tablet by mouth every 8 (eight) hours as needed.    Dispense:  30 tablet    Refill:  0    Supervising Provider:   DUDA, MARCUS V [[id number]]     Procedures: No procedures performed  Clinical Data: No additional findings.  ROS:  All other systems negative, except as noted in the HPI. Review of Systems  Objective: Vital Signs: There were no vitals taken for this visit.  Specialty Comments:  No specialty comments available.  PMFS History: Patient Active Problem List   Diagnosis Date Noted   Bipolar II disorder (HCC) 10/23/2023   Malnutrition of moderate degree 10/22/2023   Osteomyelitis of  left foot (HCC) 10/21/2023   S/P BKA (below knee amputation) unilateral, left (HCC) 10/16/2023   Thrombophlebitis arm 08/03/2023   Hypokalemia 07/30/2023   Subacute osteomyelitis, left ankle and foot (HCC) 07/29/2023   Foot ulcer with fat layer exposed, left (HCC) 11/15/2019   Displaced fracture of shaft of left clavicle, initial encounter for closed fracture 03/15/2019   Dupuytren's contracture 07/29/2018   PICC (peripherally inserted central catheter) in place 05/24/2018   Neuropathy 05/12/2018   Osteomyelitis of ankle or foot, acute, left (HCC) 05/05/2018   Diabetic ulcer of left midfoot associated with type 2 diabetes mellitus, limited to breakdown of skin (HCC) 12/18/2017   Chronic pain syndrome 11/03/2017   Gastroesophageal reflux disease with esophagitis without hemorrhage 09/17/2017   Esophageal dysphagia    DKA, type 2 (HCC) 07/15/2017   ADD (attention deficit disorder) 07/15/2017   DKA (diabetic ketoacidosis) (HCC) 07/15/2017   Vitamin B12 deficiency (non anemic) 08/13/2016   Adhesive capsulitis of left shoulder 01/18/2016   Trigger ring finger of right hand 10/11/2015   Primary osteoarthritis of first carpometacarpal joint of left hand 06/14/2015   Radial styloid tenosynovitis 06/14/2015   Depression 10/05/2014   Noncompliance with diabetes treatment 10/05/2014   Vitamin D deficiency 09/14/2014   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   Diabetic peripheral neuropathy associated with type 2 diabetes mellitus (HCC) 06/21/2014   Essential hypertension 06/21/2014   Erectile dysfunction associated with type 2 diabetes mellitus (HCC) 06/21/2014   Long-term current use of insulin  for diabetes mellitus (HCC) 06/21/2014   Adhesive capsulitis of right shoulder 03/24/2011   Retinal detachment 12/06/2010   Rotator cuff syndrome 12/06/2010   History of pancreatitis 10/22/2010   Hypertriglyceridemia 10/22/2010   Diabetes mellitus type II, uncontrolled 10/22/2010   Past  Medical History:  Diagnosis Date   ADHD (attention deficit hyperactivity disorder)    Anxiety and depression    Candida esophagitis (HCC)    in setting of DKA/hyperglycemia   CIDP (chronic inflammatory demyelinating polyneuropathy) (HCC)    Depression    Diabetes type 2, controlled (HCC)    Diverticulosis    Essential hypertension 06/21/2014   Hyperlipidemia    Kidney stones    Pancreatitis    Stomach ulcer    Vitamin D deficiency  Family History  Problem Relation Age of Onset   Stroke Father    Breast cancer Mother    Colon cancer Neg Hx     Past Surgical History:  Procedure Laterality Date   AMPUTATION Left 10/16/2023   Procedure: LEFT BELOW KNEE AMPUTATION;  Surgeon: Harden Jerona GAILS, MD;  Location: [facility] OR;  Service: Orthopedics;  Laterality: Left;  LEFT BELOW KNEE AMPUTATION   ANKLE SURGERY     APPLICATION OF WOUND VAC Left 10/16/2023   Procedure: APPLICATION, WOUND VAC;  Surgeon: Harden Jerona GAILS, MD;  Location: [facility] OR;  Service: Orthopedics;  Laterality: Left;   CERVICAL DISC SURGERY     C4-C5   ELBOW SURGERY     ESOPHAGOGASTRODUODENOSCOPY N/A 07/16/2017   Procedure: ESOPHAGOGASTRODUODENOSCOPY (EGD);  Surgeon: Avram Lupita BRAVO, MD;  Location: [facility] ENDOSCOPY;  Service: Endoscopy;  Laterality: N/A;   IRRIGATION AND DEBRIDEMENT FOOT Left 07/31/2023   Procedure: IRRIGATION AND DEBRIDEMENT FOOT AND FIFTH MET RESCTION;  Surgeon: Malvin Marsa FALCON, DPM;  Location: [facility] OR;  Service: Orthopedics/Podiatry;  Laterality: Left;   RETINAL DETACHMENT SURGERY     RHINOPLASTY     VASECTOMY     WOUND DEBRIDEMENT Left 03/27/2018   Procedure: DEBRIDEMENT WOUND;  Surgeon: Gershon Donnice SAUNDERS, DPM;  Location: [facility] OR;  Service: Podiatry;  Laterality: Left;   Social History   Occupational History   Occupation: Market Researcher: [employer]  Tobacco Use   Smoking status: Former   Smokeless tobacco: Former  Building Services Engineer status: Never Used  Substance and Sexual Activity    Alcohol use: Not Currently    Comment: Socially   Drug use: No   Sexual activity: Not Currently       "

## 2024-05-31 ENCOUNTER — Other Ambulatory Visit: Payer: Self-pay

## 2024-05-31 ENCOUNTER — Telehealth: Payer: Self-pay

## 2024-05-31 DIAGNOSIS — M25562 Pain in left knee: Secondary | ICD-10-CM

## 2024-05-31 DIAGNOSIS — Z89512 Acquired absence of left leg below knee: Secondary | ICD-10-CM

## 2024-05-31 NOTE — Telephone Encounter (Signed)
 Patient called triage phone. He wants to pursue getting the MRI of his left knee that was discussed at his last office visit. Order has been placed and patient made aware.

## 2024-06-09 ENCOUNTER — Encounter: Payer: Self-pay | Admitting: Physician Assistant

## 2024-06-14 ENCOUNTER — Inpatient Hospital Stay: Admission: RE | Admit: 2024-06-14 | Source: Ambulatory Visit
